# Patient Record
Sex: Female | Born: 1945 | Race: White | Hispanic: No | Smoking: Never smoker
Health system: Southern US, Community
[De-identification: ages and names within clinical notes are randomized; demographics above are authoritative.]

## PROBLEM LIST (undated history)

## (undated) DIAGNOSIS — C50919 Malignant neoplasm of unspecified site of unspecified female breast: Secondary | ICD-10-CM

## (undated) DIAGNOSIS — M1990 Unspecified osteoarthritis, unspecified site: Secondary | ICD-10-CM

## (undated) DIAGNOSIS — G4733 Obstructive sleep apnea (adult) (pediatric): Secondary | ICD-10-CM

## (undated) DIAGNOSIS — Z800 Family history of malignant neoplasm of digestive organs: Secondary | ICD-10-CM

## (undated) DIAGNOSIS — M5126 Other intervertebral disc displacement, lumbar region: Secondary | ICD-10-CM

## (undated) DIAGNOSIS — N6091 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: Secondary | ICD-10-CM

## (undated) DIAGNOSIS — K115 Sialolithiasis: Secondary | ICD-10-CM

## (undated) DIAGNOSIS — J309 Allergic rhinitis, unspecified: Secondary | ICD-10-CM

## (undated) DIAGNOSIS — E039 Hypothyroidism, unspecified: Secondary | ICD-10-CM

## (undated) DIAGNOSIS — Z803 Family history of malignant neoplasm of breast: Secondary | ICD-10-CM

## (undated) DIAGNOSIS — J45909 Unspecified asthma, uncomplicated: Secondary | ICD-10-CM

## (undated) DIAGNOSIS — E119 Type 2 diabetes mellitus without complications: Secondary | ICD-10-CM

## (undated) DIAGNOSIS — T7840XA Allergy, unspecified, initial encounter: Secondary | ICD-10-CM

## (undated) DIAGNOSIS — E7800 Pure hypercholesterolemia, unspecified: Secondary | ICD-10-CM

## (undated) DIAGNOSIS — G4730 Sleep apnea, unspecified: Secondary | ICD-10-CM

## (undated) DIAGNOSIS — E559 Vitamin D deficiency, unspecified: Secondary | ICD-10-CM

## (undated) HISTORY — DX: Pure hypercholesterolemia, unspecified: E78.00

## (undated) HISTORY — DX: Family history of malignant neoplasm of digestive organs: Z80.0

## (undated) HISTORY — DX: Unspecified asthma, uncomplicated: J45.909

## (undated) HISTORY — DX: Obstructive sleep apnea (adult) (pediatric): G47.33

## (undated) HISTORY — DX: Vitamin D deficiency, unspecified: E55.9

## (undated) HISTORY — DX: Type 2 diabetes mellitus without complications: E11.9

## (undated) HISTORY — DX: Family history of malignant neoplasm of breast: Z80.3

## (undated) HISTORY — DX: Allergic rhinitis, unspecified: J30.9

## (undated) HISTORY — PX: TONSILLECTOMY: SUR1361

## (undated) HISTORY — PX: APPENDECTOMY: SHX54

## (undated) HISTORY — PX: BREAST LUMPECTOMY: SHX2

## (undated) HISTORY — PX: INCISION AND DRAINAGE ABSCESS ANAL: SUR669

## (undated) HISTORY — DX: Allergy, unspecified, initial encounter: T78.40XA

## (undated) HISTORY — DX: Sleep apnea, unspecified: G47.30

## (undated) HISTORY — DX: Unspecified osteoarthritis, unspecified site: M19.90

## (undated) HISTORY — DX: Other intervertebral disc displacement, lumbar region: M51.26

## (undated) HISTORY — DX: Hypothyroidism, unspecified: E03.9

## (undated) HISTORY — DX: Malignant neoplasm of unspecified site of unspecified female breast: C50.919

## (undated) HISTORY — PX: COLONOSCOPY: SHX174

## (undated) HISTORY — DX: Sialolithiasis: K11.5

## (undated) HISTORY — PX: GANGLION CYST EXCISION: SHX1691

---

## 1974-03-17 HISTORY — PX: SHOULDER SURGERY: SHX246

## 2012-03-17 HISTORY — PX: KNEE SURGERY: SHX244

## 2014-12-13 ENCOUNTER — Ambulatory Visit (INDEPENDENT_AMBULATORY_CARE_PROVIDER_SITE_OTHER): Payer: BLUE CROSS/BLUE SHIELD | Admitting: Primary Care

## 2014-12-13 ENCOUNTER — Encounter: Payer: Self-pay | Admitting: Primary Care

## 2014-12-13 VITALS — BP 128/60 | HR 81 | Temp 98.0°F | Ht 64.25 in | Wt 294.0 lb

## 2014-12-13 DIAGNOSIS — J45909 Unspecified asthma, uncomplicated: Secondary | ICD-10-CM | POA: Insufficient documentation

## 2014-12-13 DIAGNOSIS — E039 Hypothyroidism, unspecified: Secondary | ICD-10-CM | POA: Insufficient documentation

## 2014-12-13 DIAGNOSIS — M5126 Other intervertebral disc displacement, lumbar region: Secondary | ICD-10-CM

## 2014-12-13 DIAGNOSIS — H6692 Otitis media, unspecified, left ear: Secondary | ICD-10-CM

## 2014-12-13 DIAGNOSIS — E785 Hyperlipidemia, unspecified: Secondary | ICD-10-CM

## 2014-12-13 MED ORDER — AMOXICILLIN-POT CLAVULANATE 875-125 MG PO TABS: 1.0000 | ORAL_TABLET | Freq: Two times a day (BID) | ORAL | Status: DC

## 2014-12-13 MED ORDER — ATORVASTATIN CALCIUM 10 MG PO TABS: 10.0000 mg | ORAL_TABLET | Freq: Every day | ORAL | Status: DC

## 2014-12-13 NOTE — Assessment & Plan Note (Signed)
Diagnosed 20 years ago.  Managed on Advair 250/50, albuterol PRN, and singulair. No wheezing on exam. Discussed to use advair daily and albuterol PRN.

## 2014-12-13 NOTE — Assessment & Plan Note (Signed)
Managed on atorvastatin 10 mg for years. Endorses "borderline" lipid panels for years. Discussed healthy diet and exercise. Will obtain old records for lipid panel.

## 2014-12-13 NOTE — Progress Notes (Signed)
Pre visit review using our clinic review tool, if applicable. No additional management support is needed unless otherwise documented below in the visit note. 

## 2014-12-13 NOTE — Assessment & Plan Note (Signed)
Present for years and located to L3-L4. Gets injections annually, recently had injections several months ago.

## 2014-12-13 NOTE — Patient Instructions (Addendum)
Start Augmentin antibiotics for ear infection. Take 1 tablet by mouth twice daily for 7 days.  Continue to drink plenty of water, about 2 liters daily.  Use your Advair inhaler (purple) daily as prescribed, this is a long acting inhaler. Use the albuterol inhaler for any shortness of breath or wheezing despite Advair use. This is a shorter acting inhaler that will last a short amount of time. Please notify me if you find that you are using your albuterol inhaler more than 4 times weekly.  Please schedule a physical with me next Spring, one year from your physical this year. You will also schedule a lab only appointment one week prior. We will discuss your lab results during your physical.  It was a pleasure to meet you today! Please don't hesitate to call me with any questions. Welcome to [facility]!
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
## 2014-12-13 NOTE — Assessment & Plan Note (Signed)
Currently managed on levothyroxine 175 mcg. Endorses TSH drawn several months ago in [place]. Will obtain old records.

## 2014-12-18 ENCOUNTER — Ambulatory Visit (INDEPENDENT_AMBULATORY_CARE_PROVIDER_SITE_OTHER): Payer: BLUE CROSS/BLUE SHIELD | Admitting: Primary Care

## 2014-12-18 ENCOUNTER — Encounter: Payer: Self-pay | Admitting: Primary Care

## 2014-12-18 VITALS — BP 122/72 | HR 80 | Temp 97.7°F | Ht 64.25 in | Wt 249.4 lb

## 2014-12-18 DIAGNOSIS — L089 Local infection of the skin and subcutaneous tissue, unspecified: Secondary | ICD-10-CM

## 2014-12-18 MED ORDER — DOXYCYCLINE HYCLATE 100 MG PO TABS: 100.0000 mg | ORAL_TABLET | Freq: Two times a day (BID) | ORAL | Status: DC
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
## 2014-12-18 NOTE — Patient Instructions (Signed)
Start Doxycycline antibiotic for toenail infection. Take 1 tablet by mouth twice daily for 10 days.  Stop Augmentin antibiotic.  Soak foot in warm epsom salt 30 minutes three times daily.  Protect toenail by wrapping if you wear close toed shoes.   Follow up if no improvement in 5-7 days.  It was a pleasure to see you today!

## 2014-12-18 NOTE — Progress Notes (Signed)
Pre visit review using our clinic review tool, if applicable. No additional management support is needed unless otherwise documented below in the visit note. 

## 2015-01-19 ENCOUNTER — Encounter (INDEPENDENT_AMBULATORY_CARE_PROVIDER_SITE_OTHER): Payer: Self-pay

## 2015-01-19 ENCOUNTER — Ambulatory Visit (INDEPENDENT_AMBULATORY_CARE_PROVIDER_SITE_OTHER): Payer: BLUE CROSS/BLUE SHIELD

## 2015-01-19 DIAGNOSIS — Z23 Encounter for immunization: Secondary | ICD-10-CM

## 2015-02-06 ENCOUNTER — Encounter: Payer: Self-pay | Admitting: Primary Care

## 2015-06-26 ENCOUNTER — Telehealth: Payer: Self-pay

## 2015-06-26 NOTE — Telephone Encounter (Signed)
Pt [facility]; pt established care 11/2014 and was to schedule CPX in spring; carrie will schedule pt for CPX and pt will get flonase OTC for now and get med refills updated at appt.

## 2015-07-12 ENCOUNTER — Other Ambulatory Visit: Payer: Self-pay | Admitting: Primary Care

## 2015-07-12 DIAGNOSIS — E785 Hyperlipidemia, unspecified: Secondary | ICD-10-CM

## 2015-07-12 DIAGNOSIS — E039 Hypothyroidism, unspecified: Secondary | ICD-10-CM

## 2015-07-12 DIAGNOSIS — Z0000 Encounter for general adult medical examination without abnormal findings: Secondary | ICD-10-CM

## 2015-07-16 ENCOUNTER — Other Ambulatory Visit (INDEPENDENT_AMBULATORY_CARE_PROVIDER_SITE_OTHER): Payer: BLUE CROSS/BLUE SHIELD

## 2015-07-16 DIAGNOSIS — E785 Hyperlipidemia, unspecified: Secondary | ICD-10-CM

## 2015-07-16 DIAGNOSIS — Z0000 Encounter for general adult medical examination without abnormal findings: Secondary | ICD-10-CM | POA: Diagnosis not present

## 2015-07-16 DIAGNOSIS — E039 Hypothyroidism, unspecified: Secondary | ICD-10-CM | POA: Diagnosis not present

## 2015-07-16 LAB — LIPID PANEL
Cholesterol: 177 mg/dL (ref 0–200)
HDL: 58.9 mg/dL (ref >39.00)
LDL Cholesterol: 97 mg/dL (ref 0–99)
NonHDL: 117.94
Total CHOL/HDL Ratio: 3
Triglycerides: 106 mg/dL (ref 0.0–149.0)
VLDL: 21.2 mg/dL (ref 0.0–40.0)

## 2015-07-16 LAB — CBC
HCT: 40.6 % (ref 36.0–46.0)
Hemoglobin: 13.6 g/dL (ref 12.0–15.0)
MCHC: 33.5 g/dL (ref 30.0–36.0)
MCV: 82 fl (ref 78.0–100.0)
Platelets: 274 10*3/uL (ref 150.0–400.0)
RBC: 4.95 Mil/uL (ref 3.87–5.11)
RDW: 13.9 % (ref 11.5–15.5)
WBC: 6.8 10*3/uL (ref 4.0–10.5)

## 2015-07-16 LAB — COMPREHENSIVE METABOLIC PANEL
ALT: 17 U/L (ref 0–35)
AST: 14 U/L (ref 0–37)
Albumin: 4.2 g/dL (ref 3.5–5.2)
Alkaline Phosphatase: 88 U/L (ref 39–117)
BUN: 16 mg/dL (ref 6–23)
CO2: 27 mEq/L (ref 19–32)
Calcium: 9.7 mg/dL (ref 8.4–10.5)
Chloride: 103 mEq/L (ref 96–112)
Creatinine, Ser: 0.65 mg/dL (ref 0.40–1.20)
GFR: 96.49 mL/min (ref >60.00)
Glucose, Bld: 114 mg/dL — ABNORMAL HIGH (ref 70–99)
Potassium: 4.2 mEq/L (ref 3.5–5.1)
Sodium: 140 mEq/L (ref 135–145)
Total Bilirubin: 0.4 mg/dL (ref 0.2–1.2)
Total Protein: 7 g/dL (ref 6.0–8.3)

## 2015-07-16 LAB — VITAMIN D 25 HYDROXY (VIT D DEFICIENCY, FRACTURES): VITD: 23.11 ng/mL — ABNORMAL LOW (ref 30.00–100.00)

## 2015-07-16 LAB — TSH: TSH: 0.89 u[IU]/mL (ref 0.35–4.50)

## 2015-07-16 LAB — HEMOGLOBIN A1C: Hgb A1c MFr Bld: 6.4 % (ref 4.6–6.5)

## 2015-07-18 ENCOUNTER — Ambulatory Visit (INDEPENDENT_AMBULATORY_CARE_PROVIDER_SITE_OTHER): Payer: BLUE CROSS/BLUE SHIELD | Admitting: Primary Care

## 2015-07-18 ENCOUNTER — Encounter: Payer: Self-pay | Admitting: Primary Care

## 2015-07-18 VITALS — BP 122/72 | HR 83 | Temp 97.5°F | Ht 64.0 in | Wt 240.8 lb

## 2015-07-18 DIAGNOSIS — Z0100 Encounter for examination of eyes and vision without abnormal findings: Secondary | ICD-10-CM | POA: Diagnosis not present

## 2015-07-18 DIAGNOSIS — M5126 Other intervertebral disc displacement, lumbar region: Secondary | ICD-10-CM

## 2015-07-18 DIAGNOSIS — J45909 Unspecified asthma, uncomplicated: Secondary | ICD-10-CM

## 2015-07-18 DIAGNOSIS — E119 Type 2 diabetes mellitus without complications: Secondary | ICD-10-CM | POA: Insufficient documentation

## 2015-07-18 DIAGNOSIS — E1165 Type 2 diabetes mellitus with hyperglycemia: Secondary | ICD-10-CM | POA: Insufficient documentation

## 2015-07-18 DIAGNOSIS — Z23 Encounter for immunization: Secondary | ICD-10-CM

## 2015-07-18 DIAGNOSIS — R7303 Prediabetes: Secondary | ICD-10-CM | POA: Diagnosis not present

## 2015-07-18 DIAGNOSIS — Z1239 Encounter for other screening for malignant neoplasm of breast: Secondary | ICD-10-CM | POA: Diagnosis not present

## 2015-07-18 DIAGNOSIS — E559 Vitamin D deficiency, unspecified: Secondary | ICD-10-CM | POA: Diagnosis not present

## 2015-07-18 DIAGNOSIS — Z0000 Encounter for general adult medical examination without abnormal findings: Secondary | ICD-10-CM | POA: Insufficient documentation

## 2015-07-18 DIAGNOSIS — E785 Hyperlipidemia, unspecified: Secondary | ICD-10-CM

## 2015-07-18 DIAGNOSIS — E039 Hypothyroidism, unspecified: Secondary | ICD-10-CM

## 2015-07-18 NOTE — Assessment & Plan Note (Signed)
Td and all Pneumonia UTD per patient, she will double check. Nothing from records received with this information. Mammogram ordered, colonoscopy UTD, declines Pap. Exam unremarkable. Labs with borderline diabetes and low vitamin d. Discussed the importance of a healthy diet and regular exercise in order for weight loss and to reduce risk of other medical diseases.  Follow up in 1 year for repeat physical or sooner if needed.

## 2015-07-18 NOTE — Progress Notes (Signed)
Pre visit review using our clinic review tool, if applicable. No additional management support is needed unless otherwise documented below in the visit note. 

## 2015-07-18 NOTE — Assessment & Plan Note (Signed)
Would like further evaluation for epidural spinal injections. Will send referral.

## 2015-07-18 NOTE — Progress Notes (Signed)
Subjective:    Patient ID: Molly Wall, female    DOB: [birth year], 70 y.o.   MRN: [id number]  HPI  Molly Wall is a 70 year old female who presents today for complete physical.  Immunizations: -Tetanus: Completed within 5 years -Influenza: Completed in 2016 -Pneumonia: Completed both in [place].  -Shingles: Never completed. Due today.  Diet: She endorses a healthy diet. Breakfast: [name] and eggs Lunch: Sandwich, potato chips, eats out Dinner: Meat, green beans, salad with ranch dressing, hummus Snacks: Small snack at bedtime (chips, meat) Desserts: Occasionally (4 times weekly) Beverages: Water, berry drink, occasional lemonade   Exercise: She does not currently exercise. Eye exam: Has not completed in 3 years, due, needs referral.  Dental exam: Completed in 2016. Colonoscopy: Completed last 3-4 years ago. Due in 6 years. Dexa: Completed in 2011, normal Pap Smear: Has not completed in years, declines Mammogram: Completes annually, last completed April 2016.   Wt Readings from Last 3 Encounters:  07/18/15 240 lb 12.8 oz (109.226 kg)  12/18/14 249 lb 6.4 oz (113.127 kg)  12/13/14 294 lb (133.358 kg)      Review of Systems  Constitutional: Negative for unexpected weight change.  HENT: Negative for rhinorrhea.   Respiratory: Negative for cough and shortness of breath.   Cardiovascular: Negative for chest pain.  Gastrointestinal: Negative for diarrhea and constipation.  Genitourinary: Negative for difficulty urinating.  Musculoskeletal: Positive for back pain.       Chronic back pain, due for repeat epidural/injections.    Skin: Negative for rash.  Allergic/Immunologic: Positive for environmental allergies.  Neurological: Negative for dizziness and headaches.       Occasional numbness to toes and feet  Psychiatric/Behavioral:       Denies concerns for anxiety and depression       Past Medical History  Diagnosis Date  . Asthma   . Lumbar herniated disc   .  Hypothyroidism   . Vitamin D deficiency   . Hypercholesteremia   . OSA (obstructive sleep apnea)   . Allergic rhinitis      Social History   Social History  . Marital Status: Single    Spouse Name: N/A  . Number of Children: N/A  . Years of Education: N/A   Occupational History  . Not on file.   Social History Main Topics  . Smoking status: Former Smoker    Types: Cigarettes  . Smokeless tobacco: Not on file  . Alcohol Use: Yes     Comment: 2 per week  . Drug Use: Not on file  . Sexual Activity: Not on file   Other Topics Concern  . Not on file   Social History Narrative   Single.   Moved from [place] to [place] several weeks ago.   Family lives in [place].   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    Past Surgical History  Procedure Laterality Date  . Shoulder surgery  1966  . Knee surgery  2014  . Appendectomy      Family History  Problem Relation Age of Onset  . Alzheimer's disease Mother   . Asthma Mother   . Arthritis Father   . Cancer Sister     No Known Allergies  Current Outpatient Prescriptions on File Prior to Visit  Medication Sig Dispense Refill  . albuterol (PROVENTIL HFA;VENTOLIN HFA) 108 (90 BASE) MCG/ACT inhaler Inhale 2 puffs into the lungs every  6 (six) hours as needed for wheezing or shortness of breath.    Marland Kitchen atorvastatin (LIPITOR) 10 MG tablet Take 1 tablet (10 mg total) by mouth daily. 90 tablet 2  . fluticasone (FLONASE) 50 MCG/ACT nasal spray Place into both nostrils daily.    . Fluticasone-Salmeterol (ADVAIR) 250-50 MCG/DOSE AEPB Inhale 1 puff into the lungs 2 (two) times daily.    . folic acid (FOLVITE) 1 MG tablet Take 1 mg by mouth daily.    Marland Kitchen levothyroxine (SYNTHROID, LEVOTHROID) 175 MCG tablet Take 175 mcg by mouth daily.    . montelukast (SINGULAIR) 10 MG tablet Take 10 mg by mouth at bedtime.     No current facility-administered medications  on file prior to visit.    BP 122/72 mmHg  Pulse 83  Temp(Src) 97.5 F (36.4 C) (Oral)  Ht 5\' 4"  (1.626 m)  Wt 240 lb 12.8 oz (109.226 kg)  BMI 41.31 kg/m2  SpO2 98%    Objective:   Physical Exam  Constitutional: She is oriented to person, place, and time. She appears well-nourished.  HENT:  Right Ear: Tympanic membrane and ear canal normal.  Left Ear: Tympanic membrane and ear canal normal.  Nose: Nose normal.  Mouth/Throat: Oropharynx is clear and moist.  Eyes: Conjunctivae and EOM are normal. Pupils are equal, round, and reactive to light.  Neck: Neck supple. Carotid bruit is not present. No thyromegaly present.  Cardiovascular: Normal rate and regular rhythm.   No murmur heard. Pulmonary/Chest: Effort normal and breath sounds normal. She has no rales.  Abdominal: Soft. Bowel sounds are normal. There is no tenderness.  Musculoskeletal: Normal range of motion.  Lymphadenopathy:    She has no cervical adenopathy.  Neurological: She is alert and oriented to person, place, and time. She has normal reflexes. No cranial nerve deficit.  Skin: Skin is warm and dry. No rash noted.  Psychiatric: She has a normal mood and affect.          Assessment & Plan:

## 2015-07-18 NOTE — Patient Instructions (Signed)
Increase your vitamin D capsules to 2000 units daily for low vitamin D levels.  You will be contacted regarding your referral to Optometry and also for your mammogram.  Please let us know if you have not heard back within one week.   It is important that you improve your diet. Please limit carbohydrates in the form of white bread, rice, pasta, potato chips, pretzels, processed carbohydrates, etc. Increase your consumption of fresh fruits and vegetables, lean protein.  Ensure you are consuming 64 ounces of water daily.  Start exercising. You should be getting 1 hour of moderate intensity exercise 5 days weekly.  Schedule a lab only appointment in 3 months for recheck of your sugar levels and vitamin D.  Follow up in 1 year for repeat physical or sooner if needed.  It was a pleasure to see you today!

## 2015-07-18 NOTE — Assessment & Plan Note (Signed)
Stable on lipitor per recent labs. Continue.

## 2015-07-18 NOTE — Assessment & Plan Note (Signed)
Stable on inhalers

## 2015-07-18 NOTE — Assessment & Plan Note (Signed)
A1C of 6.4 on recent labs. Discussed immediate changes in diet that are necessary in order to reduce sugars. Discussed importance of regular exercise. Will recheck labs in 3 months.

## 2015-07-18 NOTE — Assessment & Plan Note (Signed)
TSH stable per recent labs. Continue levothyroxine 175 mcg.

## 2015-07-18 NOTE — Assessment & Plan Note (Signed)
Level of 23 on recent labs.  Currently managed on 1000 units daily. Will increase to 2000 units daily and recheck labs in 3 months.

## 2015-08-20 LAB — HM DIABETES EYE EXAM

## 2015-08-23 ENCOUNTER — Encounter: Payer: Self-pay | Admitting: Primary Care

## 2015-09-09 ENCOUNTER — Other Ambulatory Visit: Payer: Self-pay | Admitting: Primary Care

## 2015-09-10 ENCOUNTER — Ambulatory Visit (INDEPENDENT_AMBULATORY_CARE_PROVIDER_SITE_OTHER): Payer: BLUE CROSS/BLUE SHIELD | Admitting: Primary Care

## 2015-09-10 ENCOUNTER — Encounter: Payer: Self-pay | Admitting: Primary Care

## 2015-09-10 VITALS — BP 126/74 | HR 72 | Temp 98.2°F | Ht 64.0 in | Wt 239.8 lb

## 2015-09-10 DIAGNOSIS — J302 Other seasonal allergic rhinitis: Secondary | ICD-10-CM

## 2015-09-10 MED ORDER — FLUTICASONE PROPIONATE 50 MCG/ACT NA SUSP: 2.0000 | Freq: Every day | NASAL | Status: DC

## 2015-09-10 NOTE — Progress Notes (Signed)
Subjective:    Patient ID: Molly Wall, female    DOB: 06-22-1945, 70 y.o.   MRN: [id number]  HPI  Molly Wall is a 70 year old female who presents today with a chief complaint of rash. She found a tick on her right inner thigh several weeks ago. She then felt itchy for several weeks. She then started to notice a rash to her right posterior neck/shoulder with itching. She's applied cortisone cream for the past 1 week to her posterior neck with improvement. Denies rash to her inner thigh, fatigue, fevers, headaches. She moved to [place] recently and is concerned about Lyme disease.  Review of Systems  Constitutional: Negative for fever and fatigue.  Gastrointestinal: Negative for nausea.  Skin: Positive for rash.  Neurological: Negative for dizziness and headaches.       Past Medical History  Diagnosis Date  . Asthma   . Lumbar herniated disc   . Hypothyroidism   . Vitamin D deficiency   . Hypercholesteremia   . OSA (obstructive sleep apnea)   . Allergic rhinitis      Social History   Social History  . Marital Status: Single    Spouse Name: N/A  . Number of Children: N/A  . Years of Education: N/A   Occupational History  . Not on file.   Social History Main Topics  . Smoking status: Former Smoker    Types: Cigarettes  . Smokeless tobacco: Not on file  . Alcohol Use: Yes     Comment: 2 per week  . Drug Use: Not on file  . Sexual Activity: Not on file   Other Topics Concern  . Not on file   Social History Narrative   Single.   Moved from [place] to [place] several weeks ago.   Family lives in [place].   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    Past Surgical History  Procedure Laterality Date  . Shoulder surgery  1966  . Knee surgery  2014  . Appendectomy      Family History  Problem Relation Age of Onset  . Alzheimer's disease Mother   . Asthma Mother   .  Arthritis Father   . Cancer Sister     No Known Allergies  Current Outpatient Prescriptions on File Prior to Visit  Medication Sig Dispense Refill  . albuterol (PROVENTIL HFA;VENTOLIN HFA) 108 (90 BASE) MCG/ACT inhaler Inhale 2 puffs into the lungs every 6 (six) hours as needed for wheezing or shortness of breath.    Marland Kitchen atorvastatin (LIPITOR) 10 MG tablet TAKE 1 TABLET DAILY 90 tablet 1  . Fluticasone-Salmeterol (ADVAIR) 250-50 MCG/DOSE AEPB Inhale 1 puff into the lungs 2 (two) times daily.    . folic acid (FOLVITE) 1 MG tablet Take 1 mg by mouth daily.    Marland Kitchen levothyroxine (SYNTHROID, LEVOTHROID) 175 MCG tablet Take 175 mcg by mouth daily.    . montelukast (SINGULAIR) 10 MG tablet Take 10 mg by mouth at bedtime.     No current facility-administered medications on file prior to visit.    BP 126/74 mmHg  Pulse 72  Temp(Src) 98.2 F (36.8 C) (Oral)  Ht 5\' 4"  (1.626 m)  Wt 239 lb 12.8 oz (108.773 kg)  BMI 41.14 kg/m2  SpO2 98%    Objective:   Physical Exam  Constitutional: She appears well-nourished. She does not appear ill.  Neck: Neck supple.  Cardiovascular:  Normal rate and regular rhythm.   Pulmonary/Chest: Effort normal and breath sounds normal.  Skin: Skin is warm and dry. No rash noted. No erythema.  No rashes present to right inner thigh, posterior neck or anywhere else on her body.          Assessment & Plan:  Rash:  Tick bite several weeks ago without rash or any symptoms at this point.  Itching with mild rash to posterior neck for 1 week that has nearly resolved. No obvious rash or lesions noted to skin today.  Reassurance provided that she does not have Lyme disease or any other tickborne illness at this time.  Discussed the use of over-the-counter hydrocortisone cream for any future itching.  Information provided to her today regarding Lyme disease and [facility] spotted fever and to alert me if she develops any of those symptoms.

## 2015-09-10 NOTE — Progress Notes (Signed)
Pre visit review using our clinic review tool, if applicable. No additional management support is needed unless otherwise documented below in the visit note. 

## 2015-09-10 NOTE — Patient Instructions (Signed)
Use the hydrocortisone cream again in the future if the bites/itching returns.  Please notify me if you develop the "bulls eye" rash, fatigue, fevers, body aches, headaches, nausea.  Ensure you are wearing bug spray when outdoors to prevent future bites.  It was a pleasure to see you today!  Lyme Disease Lyme disease is an infection that affects many parts of the body, including the skin, joints, and nervous system. CAUSES Lyme disease is caused by bacteria called Borrelia burgdorferi. You can get Lyme disease by being bitten by an infected tick. The tick must be attached to your skin for at least 36 hours to transmit the infection. Deer often carry infected ticks. RISK FACTORS  Living in or visiting [place] states, or the [place].  Spending time in wooded or grassy areas.  Being outdoors with exposed skin.  Failing to remove a tick from your skin within 3-4 days. SIGNS AND SYMPTOMS  A round, red rash that comes out from the center of the tick bite. This is the first sign of infection. The center of the rash may be blood colored or have tiny blisters.  Fatigue.  Headache.  Chills and fever.  General achiness.  Joint pain, often in the knee.  Swollen lymph glands. DIAGNOSIS Lyme disease is diagnosed with a medical history, physical exam, and blood test. TREATMENT The main treatment is antibiotic medicine, usually taken by mouth. The length of treatment depends on how soon after a tick bite you begin taking the medicine. In some cases, treatment is necessary for several weeks. If the infection is severe, IV antibiotics may be necessary. HOME CARE INSTRUCTIONS  Take your antibiotic medicine as directed by your health care provider. Finish the antibiotic even if you start to feel better.  You may take a probiotic in between doses of your antibiotic to help avoid stomach upset or diarrhea.  Check with your health care provider before supplementing  your treatment. Many alternative therapies have not been proven and may be harmful to you.  Keep all follow-up visits as directed by your health care provider. This is important. PREVENTION Reinfection is possible with another tick bite by an infected tick. Take these precautions to prevent an infection:  Cover your skin with light-colored clothing when outdoors in the spring and summer months.  Spray clothing and skin with bug spray. The spray should be 20-30% DEET.  Avoided wooded, grassy, and shaded areas.  Remove yard litter, brush, trash, and plants that attract deer and rodents.  Check yourself for ticks when you come indoors.  Wash clothing worn each day.  Check your pets for ticks before they come inside.  If you find a tick:  Remove it with tweezers.  Clean your hands and the bite area with rubbing alcohol or soap and water. Pregnant women should take special care to avoid tick bites because the infection can be passed along to the fetus. SEEK MEDICAL CARE IF:  You have symptoms after treatment.  You have removed a tick and want to bring it to your health care provider for testing. SEEK IMMEDIATE MEDICAL CARE IF:  You have an irregular heartbeat.  You have nerve pain.  Your face feels numb. MAKE SURE YOU:  Understand these instructions.  Will watch your condition.  Will get help right away if you are not doing well or get worse.   This information is not intended to replace advice given to you by your health care provider. Make sure you  discuss any questions you have with your health care provider.   Document Released: 06/09/2000 Document Revised: 03/24/2014 Document Reviewed: 07/19/2013 Elsevier Interactive Patient Education 2016 [place] Spotted Fever [facility] spotted fever is an illness that is spread to people by infected ticks. The illness causes flulike symptoms and a reddish-purple rash. This illness can quickly become  very serious. Treatment must be started right away. When the illness is not treated right away, it can sometimes lead to long-term health problems or even death. This illness is most common during warm weather when ticks are most active. CAUSES [facility] spotted fever is caused by a type of bacteria that is called Rickettsia rickettsii. This type of bacteria is carried by [place] dog ticks and [employer]. People get infected through a bite from a tick that is infected with the bacteria. The bite is painless, and it frequently goes unnoticed. The bacteria can also infect a person when tick blood or tick feces get into a person's body through damaged skin. A tick bite is not necessary for an infection to occur. People can get [facility] spotted fever if they get a tick's blood or body fluids on their skin in the area of a small cut or sore. This could happen while removing a tick from another person or a dog. The infection is not contagious, and it cannot be spread (transmitted) from person to person. SIGNS AND SYMPTOMS Symptoms may begin 2-14 days after a tick bite. The most common early symptoms are:  Fever.  Muscle aches.  Headache.  Nausea.  Vomiting.  Poor appetite.  Abdominal pain. The reddish-purple rash usually appears 3-5 days after the first symptoms begin. The rash often starts on the wrists and ankles. It may then spread to the palms, the soles of the feet, the legs, and the trunk. DIAGNOSIS Diagnosis is based on a physical exam, medical history, and blood tests. Your health care provider may suspect [facility] spotted fever in one of these cases:   If you have recently been bitten by a tick.  If you have been in areas that have a lot of ticks or in areas where the disease is common. TREATMENT It is important to begin treatment right away. Treatment will usually involve the use of antibiotic medicines. In some cases, your health care provider may  begin treatment before the diagnosis is confirmed. If your symptoms are severe, a hospital stay may be needed. HOME CARE INSTRUCTIONS  Rest as much as possible until you feel better.  Take medicines only as directed by your health care provider.  Take your antibiotic medicine as directed by your health care provider. Finish the antibiotic even if you start to feel better.  Drink enough fluid to keep your urine clear or pale yellow.  Keep all follow-up visits as directed by your health care provider. This is important. PREVENTION Avoiding tick bites can help to prevent this illness. Take these steps to avoid tick bites when you are outdoors:  Be aware that most ticks live in shrubs, low tree branches, and grassy areas. A tick can climb onto your body when you make contact with leaves or grass where the tick is waiting.  Wear protective clothing. Long sleeves and long pants are best.  Wear white clothes so you can see ticks more easily.  Tuck your pant legs into your socks.  If you go walking on a trail, stay in the middle of the trail to  avoid brushing against bushes.  Avoid walking through areas that have long grass.  Put insect repellent on all exposed skin and along boot tops, pant legs, and sleeve cuffs.  Check clothing, hair, and skin repeatedly and before going inside.  Check family members and pets for ticks.  Brush off any ticks that are not attached.  Take a shower or a bath as soon as possible after you have been outdoors. Check your skin for ticks. The most common places on the body where ticks attach themselves are the scalp, neck, armpits, waist, and groin. You can also greatly reduce your chances of getting [facility] spotted fever if you remove attached ticks as soon as possible. To remove an attached tick, use a forceps or fine-point tweezers to detach the intact tick without leaving its mouth parts in the skin. The wound from the tick bite should be washed  after the tick has been removed. SEEK MEDICAL CARE IF:  You have drainage, swelling, or increased redness or pain in the area of the rash. SEEK IMMEDIATE MEDICAL CARE IF:  You have chest pain.  You have shortness of breath.  You have a severe headache.  You have a seizure.  You have severe abdominal pain.  You are feeling confused.  You are bruising easily.  You have bleeding from your gums.  You have blood in your stool.   This information is not intended to replace advice given to you by your health care provider. Make sure you discuss any questions you have with your health care provider.   Document Released: 06/15/2000 Document Revised: 03/24/2014 Document Reviewed: 10/17/2013 Elsevier Interactive Patient Education [employer].

## 2015-10-09 ENCOUNTER — Ambulatory Visit
Admission: RE | Admit: 2015-10-09 | Discharge: 2015-10-09 | Disposition: A | Discharge status: Home / Self Care | Payer: BLUE CROSS/BLUE SHIELD | Source: Ambulatory Visit | Attending: Primary Care | Admitting: Primary Care

## 2015-10-09 DIAGNOSIS — Z1239 Encounter for other screening for malignant neoplasm of breast: Secondary | ICD-10-CM

## 2015-10-17 ENCOUNTER — Other Ambulatory Visit: Payer: Self-pay | Admitting: Primary Care

## 2015-10-17 DIAGNOSIS — R928 Other abnormal and inconclusive findings on diagnostic imaging of breast: Secondary | ICD-10-CM

## 2015-10-23 ENCOUNTER — Ambulatory Visit
Admission: RE | Admit: 2015-10-23 | Discharge: 2015-10-23 | Disposition: A | Discharge status: Home / Self Care | Payer: BLUE CROSS/BLUE SHIELD | Source: Ambulatory Visit | Attending: Primary Care | Admitting: Primary Care

## 2015-10-23 DIAGNOSIS — R928 Other abnormal and inconclusive findings on diagnostic imaging of breast: Secondary | ICD-10-CM

## 2015-12-10 ENCOUNTER — Telehealth: Payer: Self-pay | Admitting: Primary Care

## 2015-12-10 DIAGNOSIS — G8929 Other chronic pain: Secondary | ICD-10-CM

## 2015-12-10 DIAGNOSIS — M549 Dorsalgia, unspecified: Principal | ICD-10-CM

## 2015-12-10 NOTE — Telephone Encounter (Signed)
Patient was referred to [facility]. Patent wen to appointment on 12/05/15 and found out that patient isn't in network for her insurance,[employer].  Patient said she has a list of 200 doctors who are in network.  Please put in new referral for patient. Patient is asking for this to be done as soon as possible because she has already waited a couple of months for an appointment.

## 2015-12-11 NOTE — Telephone Encounter (Signed)
How did this happen? Rosaria Ferries, are you aware? I put in a new referral, please ensure they are within her network. Vallarie Mare, please call and apologize to patient, let her know that we are working on a new referral.

## 2015-12-11 NOTE — Telephone Encounter (Signed)
Spoken and notified patient of Kate's comments. Patient verbalized understanding and will wait for the phone call.

## 2015-12-12 NOTE — Telephone Encounter (Signed)
Noted  

## 2015-12-12 NOTE — Telephone Encounter (Signed)
Unfortunately it is the patients responsibility to ensure who they are going to is in their network. Her insurance is out of [place], typically [facility] would tell us or the patient that they were out of her network. We fax a copy of our patients insurance cards to the referring office and they accepted her as a patient. I called to try and inquire but could never get a person on the phone.The patient called her insurance co and got Dr Nicholaus Bloom name so we are now sending her there. I also got Dr Maryjean Ka note for her chart.

## 2015-12-28 ENCOUNTER — Ambulatory Visit (INDEPENDENT_AMBULATORY_CARE_PROVIDER_SITE_OTHER): Payer: BLUE CROSS/BLUE SHIELD

## 2015-12-28 DIAGNOSIS — Z23 Encounter for immunization: Secondary | ICD-10-CM | POA: Diagnosis not present

## 2016-01-07 ENCOUNTER — Other Ambulatory Visit: Payer: Self-pay

## 2016-01-07 DIAGNOSIS — E039 Hypothyroidism, unspecified: Secondary | ICD-10-CM

## 2016-01-07 DIAGNOSIS — J45909 Unspecified asthma, uncomplicated: Secondary | ICD-10-CM

## 2016-01-07 DIAGNOSIS — J302 Other seasonal allergic rhinitis: Secondary | ICD-10-CM

## 2016-01-07 MED ORDER — FLUTICASONE PROPIONATE 50 MCG/ACT NA SUSP: 2.0000 | Freq: Every day | NASAL | 5 refills | Status: DC

## 2016-01-07 MED ORDER — LEVOTHYROXINE SODIUM 175 MCG PO TABS: 175.0000 ug | ORAL_TABLET | Freq: Every day | ORAL | 2 refills | Status: DC

## 2016-01-07 MED ORDER — ALBUTEROL SULFATE HFA 108 (90 BASE) MCG/ACT IN AERS: 2.0000 | INHALATION_SPRAY | Freq: Four times a day (QID) | RESPIRATORY_TRACT | 5 refills | Status: DC | PRN

## 2016-01-07 NOTE — Telephone Encounter (Signed)
Pt requesting refills on the following meds I don't see anywhere were they have been filled before? Ok to refill?

## 2016-01-09 ENCOUNTER — Encounter: Payer: Self-pay | Admitting: Primary Care

## 2016-01-09 ENCOUNTER — Ambulatory Visit (INDEPENDENT_AMBULATORY_CARE_PROVIDER_SITE_OTHER): Payer: BLUE CROSS/BLUE SHIELD | Admitting: Primary Care

## 2016-01-09 VITALS — BP 148/94 | HR 89 | Temp 97.9°F | Ht 64.0 in | Wt 242.0 lb

## 2016-01-09 DIAGNOSIS — E559 Vitamin D deficiency, unspecified: Secondary | ICD-10-CM | POA: Diagnosis not present

## 2016-01-09 DIAGNOSIS — R7303 Prediabetes: Secondary | ICD-10-CM

## 2016-01-09 DIAGNOSIS — R002 Palpitations: Secondary | ICD-10-CM

## 2016-01-09 NOTE — Progress Notes (Signed)
Pre visit review using our clinic review tool, if applicable. No additional management support is needed unless otherwise documented below in the visit note. 

## 2016-01-09 NOTE — Patient Instructions (Addendum)
Your ECG looks good!  Complete lab work prior to leaving today. I will notify you of your results once received.   You must get adequate sleep every night. I suspect this is a contributing factor to your palpitations.  Use your Advair inhaler daily (purple inhaler). Inhale 1 puff into the lungs twice daily.  Use the albuterol inhaler every 6 hours as needed for wheezing/shortness of breath.  Continue your Singulair.   Please call me if you develop increased palpitations, chest pain, nausea.  It was a pleasure to see you today!

## 2016-01-09 NOTE — Progress Notes (Signed)
Subjective:    Patient ID: Molly Wall, female    DOB: Oct 16, 1945, 70 y.o.   MRN: [id number]  HPI  Molly Wall is a 70 year old female with a history of hypothyroidism and asthma who presents with a complaint of palpitations. She also reports increased stress, fatigue, shortness of breath. Her symptoms have been present intermittently for the past 2 weeks. 2 weeks ago she felt "fuzzy headed" which has since resolved.   She has been under increased stress as she is the main caregiver for her mother and is also caring for her daughter and her children as her daughter recently underwent back surgery. She hasn't slept well in weeks due to stress and caring for her family. She has been using her albuterol inhaler more often which has helped to reduce her palpitations. She has not experienced her palpitations for 2 days as she was finally able to sleep. She slept for for 11 hours total that night.   She denies chest pain, dizziness, visual changes, weakness.   Review of Systems  Constitutional: Positive for fatigue.  Eyes: Negative for visual disturbance.  Respiratory: Positive for shortness of breath.   Cardiovascular: Positive for palpitations. Negative for chest pain.  Neurological: Negative for dizziness and weakness.       Past Medical History:  Diagnosis Date  . Allergic rhinitis   . Asthma   . Hypercholesteremia   . Hypothyroidism   . Lumbar herniated disc   . OSA (obstructive sleep apnea)   . Vitamin D deficiency      Social History   Social History  . Marital status: Single    Spouse name: N/A  . Number of children: N/A  . Years of education: N/A   Occupational History  . Not on file.   Social History Main Topics  . Smoking status: Former Smoker    Types: Cigarettes  . Smokeless tobacco: Not on file  . Alcohol use Yes     Comment: 2 per week  . Drug use: Unknown  . Sexual activity: Not on file   Other Topics Concern  . Not on file   Social History  Narrative   Single.   Moved from [place] to [place] several weeks ago.   Family lives in [place].   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    Past Surgical History:  Procedure Laterality Date  . APPENDECTOMY    . KNEE SURGERY  2014  . SHOULDER SURGERY  1966    Family History  Problem Relation Age of Onset  . Alzheimer's disease Mother   . Asthma Mother   . Arthritis Father   . Cancer Sister     No Known Allergies  Current Outpatient Prescriptions on File Prior to Visit  Medication Sig Dispense Refill  . albuterol (PROVENTIL HFA;VENTOLIN HFA) 108 (90 Base) MCG/ACT inhaler Inhale 2 puffs into the lungs every 6 (six) hours as needed for wheezing or shortness of breath. 1 Inhaler 5  . atorvastatin (LIPITOR) 10 MG tablet TAKE 1 TABLET DAILY 90 tablet 1  . fluticasone (FLONASE) 50 MCG/ACT nasal spray Place 2 sprays into both nostrils daily. 16 g 5  . Fluticasone-Salmeterol (ADVAIR) 250-50 MCG/DOSE AEPB Inhale 1 puff into the lungs 2 (two) times daily.    Marland Kitchen levothyroxine (SYNTHROID, LEVOTHROID) 175 MCG tablet Take 1 tablet (175 mcg total) by mouth daily. 90 tablet 2  . montelukast (SINGULAIR) 10  MG tablet Take 10 mg by mouth at bedtime.    . folic acid (FOLVITE) 1 MG tablet Take 1 mg by mouth daily.     No current facility-administered medications on file prior to visit.     BP (!) 148/94   Pulse 89   Temp 97.9 F (36.6 C) (Oral)   Ht 5\' 4"  (1.626 m)   Wt 242 lb (109.8 kg)   SpO2 97%   BMI 41.54 kg/m    Objective:   Physical Exam  Constitutional: She appears well-nourished.  Neck: Neck supple.  Cardiovascular: Normal rate, regular rhythm and normal heart sounds.   Pulmonary/Chest: Effort normal and breath sounds normal. She has no wheezes.  Skin: Skin is warm and dry.  Psychiatric: She has a normal mood and affect.          Assessment & Plan:  Palpitations:  Intermittently  present for the past 2 weeks. Highly suspect this is due to increased stress and lack of sleep. No Palpitations for the past 2 days as she is finally able to sleep. EKG today with normal sinus rhythm, no PAC/PVC, T-wave inversion, ST changes. Given history of hypothyroidism, we will recheck TSH. Also check BMP and CBC. Long discussion today regarding caregiver burden and the importance of frequent breaks. Strict return precautions provided. She is stable for outpatient treatment.  Sheral Flow, NP

## 2016-01-10 ENCOUNTER — Other Ambulatory Visit: Payer: Self-pay | Admitting: Primary Care

## 2016-01-10 DIAGNOSIS — E119 Type 2 diabetes mellitus without complications: Secondary | ICD-10-CM

## 2016-01-10 LAB — CBC
HCT: 41.4 % (ref 36.0–46.0)
Hemoglobin: 13.7 g/dL (ref 12.0–15.0)
MCHC: 33 g/dL (ref 30.0–36.0)
MCV: 84.1 fl (ref 78.0–100.0)
Platelets: 243 10*3/uL (ref 150.0–400.0)
RBC: 4.92 Mil/uL (ref 3.87–5.11)
RDW: 14.2 % (ref 11.5–15.5)
WBC: 7.7 10*3/uL (ref 4.0–10.5)

## 2016-01-10 LAB — BASIC METABOLIC PANEL
BUN: 19 mg/dL (ref 6–23)
CO2: 29 mEq/L (ref 19–32)
Calcium: 9.6 mg/dL (ref 8.4–10.5)
Chloride: 100 mEq/L (ref 96–112)
Creatinine, Ser: 0.81 mg/dL (ref 0.40–1.20)
GFR: 74.74 mL/min (ref >60.00)
Glucose, Bld: 128 mg/dL — ABNORMAL HIGH (ref 70–99)
Potassium: 4 mEq/L (ref 3.5–5.1)
Sodium: 138 mEq/L (ref 135–145)

## 2016-01-10 LAB — TSH: TSH: 1.65 u[IU]/mL (ref 0.35–4.50)

## 2016-01-10 LAB — VITAMIN D 25 HYDROXY (VIT D DEFICIENCY, FRACTURES): VITD: 26.52 ng/mL — ABNORMAL LOW (ref 30.00–100.00)

## 2016-01-10 LAB — HEMOGLOBIN A1C: Hgb A1c MFr Bld: 6.6 % — ABNORMAL HIGH (ref 4.6–6.5)

## 2016-01-11 ENCOUNTER — Other Ambulatory Visit: Payer: Self-pay | Admitting: *Deleted

## 2016-01-11 DIAGNOSIS — E039 Hypothyroidism, unspecified: Secondary | ICD-10-CM

## 2016-01-11 MED ORDER — LEVOTHYROXINE SODIUM 175 MCG PO TABS: 175.0000 ug | ORAL_TABLET | Freq: Every day | ORAL | 0 refills | Status: DC

## 2016-01-11 MED ORDER — FOLIC ACID 1 MG PO TABS: 1.0000 mg | ORAL_TABLET | Freq: Every day | ORAL | 0 refills | Status: DC

## 2016-01-11 NOTE — Telephone Encounter (Signed)
Pt left message at Triage requesting a small amount of meds sent to [facility] until she gets her mail order Rx sent pt is out of med. Done and pt aware

## 2016-01-30 ENCOUNTER — Ambulatory Visit: Payer: BLUE CROSS/BLUE SHIELD | Admitting: Pain Medicine

## 2016-02-13 ENCOUNTER — Ambulatory Visit (HOSPITAL_BASED_OUTPATIENT_CLINIC_OR_DEPARTMENT_OTHER): Payer: BLUE CROSS/BLUE SHIELD | Admitting: Pain Medicine

## 2016-02-13 ENCOUNTER — Ambulatory Visit
Admission: RE | Admit: 2016-02-13 | Discharge: 2016-02-13 | Disposition: A | Discharge status: Home / Self Care | Payer: BLUE CROSS/BLUE SHIELD | Source: Ambulatory Visit | Attending: Pain Medicine | Admitting: Pain Medicine

## 2016-02-13 ENCOUNTER — Encounter: Payer: Self-pay | Admitting: Pain Medicine

## 2016-02-13 VITALS — BP 161/73 | HR 86 | Temp 96.4°F | Resp 18 | Ht 65.5 in | Wt 232.0 lb

## 2016-02-13 DIAGNOSIS — M25561 Pain in right knee: Secondary | ICD-10-CM | POA: Insufficient documentation

## 2016-02-13 DIAGNOSIS — G8929 Other chronic pain: Secondary | ICD-10-CM | POA: Insufficient documentation

## 2016-02-13 DIAGNOSIS — M25562 Pain in left knee: Secondary | ICD-10-CM | POA: Diagnosis not present

## 2016-02-13 DIAGNOSIS — E039 Hypothyroidism, unspecified: Secondary | ICD-10-CM | POA: Diagnosis not present

## 2016-02-13 DIAGNOSIS — Z8261 Family history of arthritis: Secondary | ICD-10-CM | POA: Insufficient documentation

## 2016-02-13 DIAGNOSIS — G4733 Obstructive sleep apnea (adult) (pediatric): Secondary | ICD-10-CM | POA: Diagnosis not present

## 2016-02-13 DIAGNOSIS — Z825 Family history of asthma and other chronic lower respiratory diseases: Secondary | ICD-10-CM | POA: Diagnosis not present

## 2016-02-13 DIAGNOSIS — M5442 Lumbago with sciatica, left side: Secondary | ICD-10-CM | POA: Insufficient documentation

## 2016-02-13 DIAGNOSIS — E119 Type 2 diabetes mellitus without complications: Secondary | ICD-10-CM | POA: Diagnosis not present

## 2016-02-13 DIAGNOSIS — E7800 Pure hypercholesterolemia, unspecified: Secondary | ICD-10-CM | POA: Diagnosis not present

## 2016-02-13 DIAGNOSIS — Z79899 Other long term (current) drug therapy: Secondary | ICD-10-CM | POA: Insufficient documentation

## 2016-02-13 DIAGNOSIS — M545 Low back pain: Secondary | ICD-10-CM | POA: Diagnosis present

## 2016-02-13 DIAGNOSIS — M5410 Radiculopathy, site unspecified: Secondary | ICD-10-CM | POA: Diagnosis not present

## 2016-02-13 DIAGNOSIS — J45909 Unspecified asthma, uncomplicated: Secondary | ICD-10-CM | POA: Insufficient documentation

## 2016-02-13 DIAGNOSIS — M5126 Other intervertebral disc displacement, lumbar region: Secondary | ICD-10-CM | POA: Diagnosis not present

## 2016-02-13 DIAGNOSIS — M79605 Pain in left leg: Secondary | ICD-10-CM | POA: Diagnosis not present

## 2016-02-13 DIAGNOSIS — Z87891 Personal history of nicotine dependence: Secondary | ICD-10-CM | POA: Insufficient documentation

## 2016-02-13 DIAGNOSIS — Z7951 Long term (current) use of inhaled steroids: Secondary | ICD-10-CM | POA: Insufficient documentation

## 2016-02-13 DIAGNOSIS — E559 Vitamin D deficiency, unspecified: Secondary | ICD-10-CM | POA: Insufficient documentation

## 2016-02-13 DIAGNOSIS — M5441 Lumbago with sciatica, right side: Secondary | ICD-10-CM

## 2016-02-13 DIAGNOSIS — M79604 Pain in right leg: Secondary | ICD-10-CM

## 2016-02-13 MED ORDER — SODIUM CHLORIDE 0.9% FLUSH: 2.0000 mL | Freq: Once | INTRAVENOUS | Status: DC

## 2016-02-13 MED ORDER — MIDAZOLAM HCL 5 MG/5ML IJ SOLN
1.0000 mg | INTRAMUSCULAR | Status: DC | PRN
  Administered 2016-02-13: 2 mg via INTRAVENOUS

## 2016-02-13 MED ORDER — TRIAMCINOLONE ACETONIDE 40 MG/ML IJ SUSP
INTRAMUSCULAR | Status: AC
  Filled 2016-02-13: qty 1

## 2016-02-13 MED ORDER — GLYCOPYRROLATE 0.2 MG/ML IJ SOLN
INTRAMUSCULAR | Status: AC
  Administered 2016-02-13: 0.2 mg via INTRAVENOUS
  Filled 2016-02-13: qty 1

## 2016-02-13 MED ORDER — LIDOCAINE HCL (PF) 1 % IJ SOLN
INTRAMUSCULAR | Status: AC
  Filled 2016-02-13: qty 5

## 2016-02-13 MED ORDER — IOPAMIDOL (ISOVUE-M 200) INJECTION 41%
10.0000 mL | Freq: Once | INTRAMUSCULAR | Status: DC
  Filled 2016-02-13: qty 10

## 2016-02-13 MED ORDER — ROPIVACAINE HCL 2 MG/ML IJ SOLN
INTRAMUSCULAR | Status: AC
  Filled 2016-02-13: qty 10

## 2016-02-13 MED ORDER — TRIAMCINOLONE ACETONIDE 40 MG/ML IJ SUSP
40.0000 mg | Freq: Once | INTRAMUSCULAR | Status: AC
  Administered 2016-02-13: 11:00:00

## 2016-02-13 MED ORDER — FENTANYL CITRATE (PF) 100 MCG/2ML IJ SOLN
INTRAMUSCULAR | Status: AC
  Administered 2016-02-13: 50 ug via INTRAVENOUS
  Filled 2016-02-13: qty 2

## 2016-02-13 MED ORDER — MIDAZOLAM HCL 5 MG/5ML IJ SOLN
INTRAMUSCULAR | Status: AC
  Administered 2016-02-13: 2 mg via INTRAVENOUS
  Filled 2016-02-13: qty 5

## 2016-02-13 MED ORDER — FENTANYL CITRATE (PF) 100 MCG/2ML IJ SOLN
25.0000 ug | INTRAMUSCULAR | Status: DC | PRN
  Administered 2016-02-13: 50 ug via INTRAVENOUS

## 2016-02-13 MED ORDER — LIDOCAINE HCL (PF) 1 % IJ SOLN
10.0000 mL | Freq: Once | INTRAMUSCULAR | Status: AC
  Administered 2016-02-13: 11:00:00
  Filled 2016-02-13: qty 10

## 2016-02-13 MED ORDER — LACTATED RINGERS IV SOLN: 1000.0000 mL | Freq: Once | INTRAVENOUS | Status: DC

## 2016-02-13 MED ORDER — SODIUM CHLORIDE 0.9 % IJ SOLN
INTRAMUSCULAR | Status: AC
  Administered 2016-02-13: 2 mL
  Filled 2016-02-13: qty 10

## 2016-02-13 MED ORDER — ROPIVACAINE HCL 2 MG/ML IJ SOLN
2.0000 mL | Freq: Once | INTRAMUSCULAR | Status: AC
  Administered 2016-02-13: 11:00:00 via EPIDURAL

## 2016-02-13 NOTE — Progress Notes (Signed)
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 information was provided in large part by the patient, it cannot be guaranteed that it has not been purposely or unconsciously manipulated. Every effort has been made to obtain as much relevant data as possible for this evaluation. It is important to note that the conclusions that lead to this procedure are derived in large part from the available data. Always take into account that the treatment will also be dependent on availability of resources and existing treatment guidelines, considered by other Pain Management Practitioners as being common knowledge and practice, at the time of the intervention. For Medico-Legal purposes, it is also important to point out that variation in procedural techniques and pharmacological choices are the acceptable norm. The indications, contraindications, technique, and results of the above procedure should only be interpreted and judged by a Board-Certified Interventional Pain Specialist with extensive familiarity and expertise in the same exact procedure and technique. Attempts at  providing opinions without similar or greater experience and expertise than that of the treating physician will be considered as inappropriate and unethical, and shall result in a formal complaint to the state medical board and applicable specialty societies.  Instructions provided at this appointment: Patient Instructions  Pain Management Discharge Instructions  General Discharge Instructions :  If you need to reach your doctor call: Monday-Friday 8:00 am - 4:00 pm at [phone] or toll free [phone].  After clinic hours [phone] to have operator reach doctor.  Bring all of your medication bottles to all your appointments in the pain clinic.  To cancel or reschedule your appointment with Pain Management please remember to call 24 hours in advance to avoid a fee.  Refer to the educational materials which you have been given on: General Risks, I had my Procedure. Discharge Instructions, Post Sedation.  Post Procedure Instructions:  The drugs you were given will stay in your system until tomorrow, so for the next 24 hours you should not drive, make any legal decisions or drink any alcoholic beverages.  You may eat anything you prefer, but it is better to start with liquids then soups and crackers, and gradually work up to solid foods.  Please notify your doctor immediately if you have any unusual bleeding, trouble breathing or pain that is not related to your normal pain.  Depending on the type of procedure that was done, some parts of your body may feel week and/or numb.  This usually clears up by tonight or the next day.  Walk with the use of an assistive device or accompanied by an adult for the 24 hours.  You may use ice on the affected area for the first 24 hours.  Put ice in a Ziploc bag and cover with a towel  and place against area 15 minutes on 15 minutes off.  You may switch to heat after 24 hours.Epidural Steroid Injection Patient Information  Description: The  epidural space surrounds the nerves as they exit the spinal cord.  In some patients, the nerves can be compressed and inflamed by a bulging disc or a tight spinal canal (spinal stenosis).  By injecting steroids into the epidural space, we can bring irritated nerves into direct contact with a potentially helpful medication.  These steroids act directly on the irritated nerves and can reduce swelling and inflammation which often leads to decreased pain.  Epidural steroids may be injected anywhere along the spine and from the neck to the low back depending upon the location of your pain.   After numbing the skin with local anesthetic (like Novocaine), a small needle is passed into the epidural space slowly.  You may experience a sensation of pressure while this is being done.  The entire block usually last less than 10 minutes.  Conditions which may be treated by epidural steroids:   Low back and leg pain  Neck and arm pain  Spinal stenosis  Post-laminectomy syndrome  Herpes zoster (shingles) pain  Pain from compression fractures  Preparation for the injection:  1. Do not eat any solid food or dairy products within 8 hours of your appointment.  2. You may drink clear liquids up to 3 hours before appointment.  Clear liquids include water, black coffee, juice or soda.  No milk or cream please. 3. You may take your regular medication, including pain medications, with a sip of water before your appointment  Diabetics should hold regular insulin (if taken separately) and take 1/2 normal NPH dos the morning of the procedure.  Carry some sugar containing items with you to your appointment. 4. A driver must accompany you and be prepared to drive you home after your procedure.  5. Bring all your current medications with your. 6. An IV may be inserted and sedation may be given at the discretion of the physician.   7. A blood pressure cuff, EKG and other monitors will often be applied during the  procedure.  Some patients may need to have extra oxygen administered for a short period. 8. You will be asked to provide medical information, including your allergies, prior to the procedure.  We must know immediately if you are taking blood thinners (like Coumadin/Warfarin)  Or if you are allergic to IV iodine contrast (dye). We must know if you could possible be pregnant.  Possible side-effects:  Bleeding from needle site  Infection (rare, may require surgery)  Nerve injury (rare)  Numbness & tingling (temporary)  Difficulty urinating (rare, temporary)  Spinal headache ( a headache worse with upright posture)  Light -headedness (temporary)  Pain at injection site (several days)  Decreased blood pressure (temporary)  Weakness in arm/leg (temporary)  Pressure sensation in back/neck (temporary)  Call if you experience:  Fever/chills associated with headache or increased back/neck pain.  Headache worsened by an upright position.  New onset weakness or numbness of an extremity below the injection site  Hives or difficulty breathing (go to the emergency room)  Inflammation or drainage at the infection site  Severe back/neck pain  Any new symptoms which are concerning to you  Please note:  Although the local anesthetic injected can often make your back or neck feel good for several hours after the injection, the pain will likely return.  It takes 3-7 days for steroids to work  in the epidural space.  You may not notice any pain relief for at least that one week.  If effective, we will often do a series of three injections spaced 3-6 weeks apart to maximally decrease your pain.  After the initial series, we generally will wait several months before considering a repeat injection of the same type.  If you have any questions, please call [phone] [facility]

## 2016-02-13 NOTE — Patient Instructions (Signed)
Pain Management Discharge Instructions  General Discharge Instructions :  If you need to reach your doctor call: Monday-Friday 8:00 am - 4:00 pm at [phone] or toll free [phone].  After clinic hours [phone] to have operator reach doctor.  Bring all of your medication bottles to all your appointments in the pain clinic.  To cancel or reschedule your appointment with Pain Management please remember to call 24 hours in advance to avoid a fee.  Refer to the educational materials which you have been given on: General Risks, I had my Procedure. Discharge Instructions, Post Sedation.  Post Procedure Instructions:  The drugs you were given will stay in your system until tomorrow, so for the next 24 hours you should not drive, make any legal decisions or drink any alcoholic beverages.  You may eat anything you prefer, but it is better to start with liquids then soups and crackers, and gradually work up to solid foods.  Please notify your doctor immediately if you have any unusual bleeding, trouble breathing or pain that is not related to your normal pain.  Depending on the type of procedure that was done, some parts of your body may feel week and/or numb.  This usually clears up by tonight or the next day.  Walk with the use of an assistive device or accompanied by an adult for the 24 hours.  You may use ice on the affected area for the first 24 hours.  Put ice in a Ziploc bag and cover with a towel and place against area 15 minutes on 15 minutes off.  You may switch to heat after 24 hours.Epidural Steroid Injection Patient Information  Description: The epidural space surrounds the nerves as they exit the spinal cord.  In some patients, the nerves can be compressed and inflamed by a bulging disc or a tight spinal canal (spinal stenosis).  By injecting steroids into the epidural space, we can bring irritated nerves into direct contact with a potentially helpful medication.  These  steroids act directly on the irritated nerves and can reduce swelling and inflammation which often leads to decreased pain.  Epidural steroids may be injected anywhere along the spine and from the neck to the low back depending upon the location of your pain.   After numbing the skin with local anesthetic (like Novocaine), a small needle is passed into the epidural space slowly.  You may experience a sensation of pressure while this is being done.  The entire block usually last less than 10 minutes.  Conditions which may be treated by epidural steroids:   Low back and leg pain  Neck and arm pain  Spinal stenosis  Post-laminectomy syndrome  Herpes zoster (shingles) pain  Pain from compression fractures  Preparation for the injection:  1. Do not eat any solid food or dairy products within 8 hours of your appointment.  2. You may drink clear liquids up to 3 hours before appointment.  Clear liquids include water, black coffee, juice or soda.  No milk or cream please. 3. You may take your regular medication, including pain medications, with a sip of water before your appointment  Diabetics should hold regular insulin (if taken separately) and take 1/2 normal NPH dos the morning of the procedure.  Carry some sugar containing items with you to your appointment. 4. A driver must accompany you and be prepared to drive you home after your procedure.  5. Bring all your current medications with your. 6. An IV may be inserted and   sedation may be given at the discretion of the physician.   7. A blood pressure cuff, EKG and other monitors will often be applied during the procedure.  Some patients may need to have extra oxygen administered for a short period. 8. You will be asked to provide medical information, including your allergies, prior to the procedure.  We must know immediately if you are taking blood thinners (like Coumadin/Warfarin)  Or if you are allergic to IV iodine contrast (dye). We must  know if you could possible be pregnant.  Possible side-effects:  Bleeding from needle site  Infection (rare, may require surgery)  Nerve injury (rare)  Numbness & tingling (temporary)  Difficulty urinating (rare, temporary)  Spinal headache ( a headache worse with upright posture)  Light -headedness (temporary)  Pain at injection site (several days)  Decreased blood pressure (temporary)  Weakness in arm/leg (temporary)  Pressure sensation in back/neck (temporary)  Call if you experience:  Fever/chills associated with headache or increased back/neck pain.  Headache worsened by an upright position.  New onset weakness or numbness of an extremity below the injection site  Hives or difficulty breathing (go to the emergency room)  Inflammation or drainage at the infection site  Severe back/neck pain  Any new symptoms which are concerning to you  Please note:  Although the local anesthetic injected can often make your back or neck feel good for several hours after the injection, the pain will likely return.  It takes 3-7 days for steroids to work in the epidural space.  You may not notice any pain relief for at least that one week.  If effective, we will often do a series of three injections spaced 3-6 weeks apart to maximally decrease your pain.  After the initial series, we generally will wait several months before considering a repeat injection of the same type.  If you have any questions, please call [phone] [facility] 

## 2016-02-13 NOTE — Progress Notes (Signed)
Safety precautions to be maintained throughout the outpatient stay will include: orient to surroundings, keep bed in low position, maintain call bell within reach at all times, provide assistance with transfer out of bed and ambulation.  

## 2016-02-14 ENCOUNTER — Telehealth: Payer: Self-pay | Admitting: *Deleted

## 2016-02-14 NOTE — Telephone Encounter (Signed)
LVM for patient to return call for any concerns post procedure.

## 2016-03-08 ENCOUNTER — Other Ambulatory Visit: Payer: Self-pay | Admitting: Primary Care

## 2016-03-26 ENCOUNTER — Ambulatory Visit
Admission: RE | Admit: 2016-03-26 | Discharge: 2016-03-26 | Disposition: A | Discharge status: Home / Self Care | Payer: BLUE CROSS/BLUE SHIELD | Source: Ambulatory Visit | Attending: Pain Medicine | Admitting: Pain Medicine

## 2016-03-26 ENCOUNTER — Encounter: Payer: Self-pay | Admitting: Pain Medicine

## 2016-03-26 ENCOUNTER — Ambulatory Visit: Payer: BLUE CROSS/BLUE SHIELD | Attending: Pain Medicine | Admitting: Pain Medicine

## 2016-03-26 VITALS — BP 155/75 | HR 77 | Temp 98.5°F | Resp 16 | Ht 65.0 in | Wt 232.0 lb

## 2016-03-26 DIAGNOSIS — G8929 Other chronic pain: Secondary | ICD-10-CM | POA: Insufficient documentation

## 2016-03-26 DIAGNOSIS — M5442 Lumbago with sciatica, left side: Secondary | ICD-10-CM | POA: Insufficient documentation

## 2016-03-26 DIAGNOSIS — M79605 Pain in left leg: Secondary | ICD-10-CM | POA: Diagnosis not present

## 2016-03-26 DIAGNOSIS — Z9889 Other specified postprocedural states: Secondary | ICD-10-CM | POA: Diagnosis not present

## 2016-03-26 DIAGNOSIS — M47897 Other spondylosis, lumbosacral region: Secondary | ICD-10-CM | POA: Insufficient documentation

## 2016-03-26 DIAGNOSIS — M5410 Radiculopathy, site unspecified: Secondary | ICD-10-CM | POA: Diagnosis not present

## 2016-03-26 DIAGNOSIS — M47896 Other spondylosis, lumbar region: Secondary | ICD-10-CM | POA: Diagnosis not present

## 2016-03-26 DIAGNOSIS — M79604 Pain in right leg: Secondary | ICD-10-CM | POA: Diagnosis not present

## 2016-03-26 DIAGNOSIS — Z87891 Personal history of nicotine dependence: Secondary | ICD-10-CM | POA: Insufficient documentation

## 2016-03-26 DIAGNOSIS — Z79899 Other long term (current) drug therapy: Secondary | ICD-10-CM | POA: Insufficient documentation

## 2016-03-26 DIAGNOSIS — M4317 Spondylolisthesis, lumbosacral region: Secondary | ICD-10-CM | POA: Diagnosis not present

## 2016-03-26 NOTE — Progress Notes (Signed)
Safety precautions to be maintained throughout the outpatient stay will include: orient to surroundings, keep bed in low position, maintain call bell within reach at all times, provide assistance with transfer out of bed and ambulation.  

## 2016-03-26 NOTE — Patient Instructions (Signed)
GENERAL RISKS AND COMPLICATIONS  What are the risk, side effects and possible complications? Generally speaking, most procedures are safe.  However, with any procedure there are risks, side effects, and the possibility of complications.  The risks and complications are dependent upon the sites that are lesioned, or the type of nerve block to be performed.  The closer the procedure is to the spine, the more serious the risks are.  Great care is taken when placing the radio frequency needles, block needles or lesioning probes, but sometimes complications can occur. 1. Infection: Any time there is an injection through the skin, there is a risk of infection.  This is why sterile conditions are used for these blocks.  There are four possible types of infection. 1. Localized skin infection. 2. Central Nervous System Infection-This can be in the form of Meningitis, which can be deadly. 3. Epidural Infections-This can be in the form of an epidural abscess, which can cause pressure inside of the spine, causing compression of the spinal cord with subsequent paralysis. This would require an emergency surgery to decompress, and there are no guarantees that the patient would recover from the paralysis. 4. Discitis-This is an infection of the intervertebral discs.  It occurs in about 1% of discography procedures.  It is difficult to treat and it may lead to surgery.        2. Pain: the needles have to go through skin and soft tissues, will cause soreness.       3. Damage to internal structures:  The nerves to be lesioned may be near blood vessels or    other nerves which can be potentially damaged.       4. Bleeding: Bleeding is more common if the patient is taking blood thinners such as  aspirin, Coumadin, Ticiid, Plavix, etc., or if he/she have some genetic predisposition  such as hemophilia. Bleeding into the spinal canal can cause compression of the spinal  cord with subsequent paralysis.  This would require an  emergency surgery to  decompress and there are no guarantees that the patient would recover from the  paralysis.       5. Pneumothorax:  Puncturing of a lung is a possibility, every time a needle is introduced in  the area of the chest or upper back.  Pneumothorax refers to free air around the  collapsed lung(s), inside of the thoracic cavity (chest cavity).  Another two possible  complications related to a similar event would include: Hemothorax and Chylothorax.   These are variations of the Pneumothorax, where instead of air around the collapsed  lung(s), you may have blood or chyle, respectively.       6. Spinal headaches: They may occur with any procedures in the area of the spine.       7. Persistent CSF (Cerebro-Spinal Fluid) leakage: This is a rare problem, but may occur  with prolonged intrathecal or epidural catheters either due to the formation of a fistulous  track or a dural tear.       8. Nerve damage: By working so close to the spinal cord, there is always a possibility of  nerve damage, which could be as serious as a permanent spinal cord injury with  paralysis.       9. Death:  Although rare, severe deadly allergic reactions known as "Anaphylactic  reaction" can occur to any of the medications used.      10. Worsening of the symptoms:  We can always make thing worse.    What are the chances of something like this happening? Chances of any of this occuring are extremely low.  By statistics, you have more of a chance of getting killed in a motor vehicle accident: while driving to the hospital than any of the above occurring .  Nevertheless, you should be aware that they are possibilities.  In general, it is similar to taking a shower.  Everybody knows that you can slip, hit your head and get killed.  Does that mean that you should not shower again?  Nevertheless always keep in mind that statistics do not mean anything if you happen to be on the wrong side of them.  Even if a procedure has a 1  (one) in a 1,000,000 (million) chance of going wrong, it you happen to be that one..Also, keep in mind that by statistics, you have more of a chance of having something go wrong when taking medications.  Who should not have this procedure? If you are on a blood thinning medication (e.g. Coumadin, Plavix, see list of "Blood Thinners"), or if you have an active infection going on, you should not have the procedure.  If you are taking any blood thinners, please inform your physician.  How should I prepare for this procedure?  Do not eat or drink anything at least six hours prior to the procedure.  Bring a driver with you .  It cannot be a taxi.  Come accompanied by an adult that can drive you back, and that is strong enough to help you if your legs get weak or numb from the local anesthetic.  Take all of your medicines the morning of the procedure with just enough water to swallow them.  If you have diabetes, make sure that you are scheduled to have your procedure done first thing in the morning, whenever possible.  If you have diabetes, take only half of your insulin dose and notify our nurse that you have done so as soon as you arrive at the clinic.  If you are diabetic, but only take blood sugar pills (oral hypoglycemic), then do not take them on the morning of your procedure.  You may take them after you have had the procedure.  Do not take aspirin or any aspirin-containing medications, at least eleven (11) days prior to the procedure.  They may prolong bleeding.  Wear loose fitting clothing that may be easy to take off and that you would not mind if it got stained with Betadine or blood.  Do not wear any jewelry or perfume  Remove any nail coloring.  It will interfere with some of our monitoring equipment.  NOTE: Remember that this is not meant to be interpreted as a complete list of all possible complications.  Unforeseen problems may occur.  BLOOD THINNERS The following drugs  contain aspirin or other products, which can cause increased bleeding during surgery and should not be taken for 2 weeks prior to and 1 week after surgery.  If you should need take something for relief of minor pain, you may take acetaminophen which is found in Tylenol,m Datril, Anacin-3 and Panadol. It is not blood thinner. The products listed below are.  Do not take any of the products listed below in addition to any listed on your instruction sheet.  A.P.C or A.P.C with Codeine Codeine Phosphate Capsules #3 Ibuprofen Ridaura  ABC compound Congesprin Imuran rimadil  Advil Cope Indocin Robaxisal  Alka-Seltzer Effervescent Pain Reliever and Antacid Coricidin or Coricidin-D  Indomethacin Rufen    Alka-Seltzer plus Cold Medicine Cosprin Ketoprofen S-A-C Tablets  Anacin Analgesic Tablets or Capsules Coumadin Korlgesic Salflex  Anacin Extra Strength Analgesic tablets or capsules CP-2 Tablets Lanoril Salicylate  Anaprox Cuprimine Capsules Levenox Salocol  Anexsia-D Dalteparin Magan Salsalate  Anodynos Darvon compound Magnesium Salicylate Sine-off  Ansaid Dasin Capsules Magsal Sodium Salicylate  Anturane Depen Capsules Marnal Soma  APF Arthritis pain formula Dewitt's Pills Measurin Stanback  Argesic Dia-Gesic Meclofenamic Sulfinpyrazone  Arthritis Bayer Timed Release Aspirin Diclofenac Meclomen Sulindac  Arthritis pain formula Anacin Dicumarol Medipren Supac  Analgesic (Safety coated) Arthralgen Diffunasal Mefanamic Suprofen  Arthritis Strength Bufferin Dihydrocodeine Mepro Compound Suprol  Arthropan liquid Dopirydamole Methcarbomol with Aspirin Synalgos  ASA tablets/Enseals Disalcid Micrainin Tagament  Ascriptin Doan's Midol Talwin  Ascriptin A/D Dolene Mobidin Tanderil  Ascriptin Extra Strength Dolobid Moblgesic Ticlid  Ascriptin with Codeine Doloprin or Doloprin with Codeine Momentum Tolectin  Asperbuf Duoprin Mono-gesic Trendar  Aspergum Duradyne Motrin or Motrin IB Triminicin  Aspirin  plain, buffered or enteric coated Durasal Myochrisine Trigesic  Aspirin Suppositories Easprin Nalfon Trillsate  Aspirin with Codeine Ecotrin Regular or Extra Strength Naprosyn Uracel  Atromid-S Efficin Naproxen Ursinus  Auranofin Capsules Elmiron Neocylate Vanquish  Axotal Emagrin Norgesic Verin  Azathioprine Empirin or Empirin with Codeine Normiflo Vitamin E  Azolid Emprazil Nuprin Voltaren  Bayer Aspirin plain, buffered or children's or timed BC Tablets or powders Encaprin Orgaran Warfarin Sodium  Buff-a-Comp Enoxaparin Orudis Zorpin  Buff-a-Comp with Codeine Equegesic Os-Cal-Gesic   Buffaprin Excedrin plain, buffered or Extra Strength Oxalid   Bufferin Arthritis Strength Feldene Oxphenbutazone   Bufferin plain or Extra Strength Feldene Capsules Oxycodone with Aspirin   Bufferin with Codeine Fenoprofen Fenoprofen Pabalate or Pabalate-SF   Buffets II Flogesic Panagesic   Buffinol plain or Extra Strength Florinal or Florinal with Codeine Panwarfarin   Buf-Tabs Flurbiprofen Penicillamine   Butalbital Compound Four-way cold tablets Penicillin   Butazolidin Fragmin Pepto-Bismol   Carbenicillin Geminisyn Percodan   Carna Arthritis Reliever Geopen Persantine   Carprofen Gold's salt Persistin   Chloramphenicol Goody's Phenylbutazone   Chloromycetin Haltrain Piroxlcam   Clmetidine heparin Plaquenil   Cllnoril Hyco-pap Ponstel   Clofibrate Hydroxy chloroquine Propoxyphen         Before stopping any of these medications, be sure to consult the physician who ordered them.  Some, such as Coumadin (Warfarin) are ordered to prevent or treat serious conditions such as "deep thrombosis", "pumonary embolisms", and other heart problems.  The amount of time that you may need off of the medication may also vary with the medication and the reason for which you were taking it.  If you are taking any of these medications, please make sure you notify your pain physician before you undergo any  procedures.         Epidural Steroid Injection An epidural steroid injection is a shot of steroid medicine and numbing medicine that is given into the space between the spinal cord and the bones in your back (epidural space). The shot helps relieve pain caused by an irritated or swollen nerve root. The amount of pain relief you get from the injection depends on what is causing the nerve to be swollen and irritated, and how long your pain lasts. You are more likely to benefit from this injection if your pain is strong and comes on suddenly rather than if you have had pain for a long time. Tell a health care provider about:  Any allergies you have.  All medicines you are taking, including vitamins, herbs,   eye drops, creams, and over-the-counter medicines.  Any problems you or family members have had with anesthetic medicines.  Any blood disorders you have.  Any surgeries you have had.  Any medical conditions you have.  Whether you are pregnant or may be pregnant. What are the risks? Generally, this is a safe procedure. However, problems may occur, including:  Headache.  Bleeding.  Infection.  Allergic reaction to medicines.  Damage to your nerves.  What happens before the procedure? Staying hydrated Follow instructions from your health care provider about hydration, which may include:  Up to 2 hours before the procedure - you may continue to drink clear liquids, such as water, clear fruit juice, black coffee, and plain tea.  Eating and drinking restrictions Follow instructions from your health care provider about eating and drinking, which may include:  8 hours before the procedure - stop eating heavy meals or foods such as meat, fried foods, or fatty foods.  6 hours before the procedure - stop eating light meals or foods, such as toast or cereal.  6 hours before the procedure - stop drinking milk or drinks that contain milk.  2 hours before the procedure - stop  drinking clear liquids.  Medicine  You may be given medicines to lower anxiety.  Ask your health care provider about: ? Changing or stopping your regular medicines. This is especially important if you are taking diabetes medicines or blood thinners. ? Taking medicines such as aspirin and ibuprofen. These medicines can thin your blood. Do not take these medicines before your procedure if your health care provider instructs you not to. General instructions  Plan to have someone take you home from the hospital or clinic. What happens during the procedure?  You may receive a medicine to help you relax (sedative).  You will be asked to lie on your abdomen.  The injection site will be cleaned.  A numbing medicine (local anesthetic) will be used to numb the injection site.  A needle will be inserted through your skin into the epidural space. You may feel some discomfort when this happens. An X-ray machine will be used to make sure the needle is put as close as possible to the affected nerve.  A steroid medicine and a local anesthetic will be injected into the epidural space.  The needle will be removed.  A bandage (dressing) will be put over the injection site. What happens after the procedure?  Your blood pressure, heart rate, breathing rate, and blood oxygen level will be monitored until the medicines you were given have worn off.  Your arm or leg may feel weak or numb for a few hours.  The injection site may feel sore.  Do not drive for 24 hours if you received a sedative. This information is not intended to replace advice given to you by your health care provider. Make sure you discuss any questions you have with your health care provider. Document Released: 06/10/2007 Document Revised: 08/15/2015 Document Reviewed: 06/19/2015 [employer] Interactive Patient Education  2017 [employer]  

## 2016-03-26 NOTE — Progress Notes (Signed)
Patient's Name: Molly Wall  MRN: [id number]  Referring Provider: [name], NP  DOB: January 02, 1946  PCP: [name], NP  DOS: 03/26/2016  Note by: Kathlen Brunswick. Dossie Arbour, MD  Service setting: Ambulatory outpatient  Specialty: Interventional Pain Management  Location: [facility] (AMB) Pain Management Facility    Patient type: Established   Primary Reason(s) for Visit: Encounter for post-procedure evaluation of chronic illness with mild to moderate exacerbation CC: Back Pain (lower)  HPI  Molly Wall is a 71 y.o. year old, female patient, who comes today for a post-procedure evaluation. 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 Her primarily concern today is the Back Pain (lower)  Pain Assessment: Self-Reported Pain Score: 1 /10             Reported level is compatible with observation.       Pain Type: Chronic pain Pain Location: Back Pain Orientation: Lower, Left, Right Pain Descriptors / Indicators: Aching, Discomfort, Constant Pain Frequency: Constant (varies in intensity)  Molly Wall comes in today for post-procedure evaluation after the treatment done on 02/13/2016.  Further details on both, my assessment(s), as well as the proposed treatment plan, please see below.  Post-Procedure Assessment  02/13/2016 Procedure: Therapeutic right-sided L4-5 interlaminar laminar lumbar epidural steroid injection under fluoroscopic guidance and IV sedation Post-procedure pain score: 0/10 (100% relief) Influential Factors: BMI: 38.61 kg/m Intra-procedural challenges: None observed Assessment challenges: None detected         Post-procedural side-effects, adverse reactions, or complications: None reported Reported  issues: None  Sedation: Sedation provided. When no sedatives are used, the analgesic levels obtained are directly associated to the effectiveness of the local anesthetics. However, when sedation is provided, the level of analgesia obtained during the initial 1 hour following the intervention, is believed to be the result of a combination of factors. These factors may include, but are not limited to: 1. The effectiveness of the local anesthetics used. 2. The effects of the analgesic(s) and/or anxiolytic(s) used. 3. The degree of discomfort experienced by the patient at the time of the procedure. 4. The patients ability and reliability in recalling and recording the events. 5. The presence and influence of possible secondary gains and/or psychosocial factors. Reported result: Relief experienced during the 1st hour after the procedure: 100 % (Ultra-Short Term Relief) Interpretative annotation: Analgesia during this period is likely to be Local Anesthetic and/or IV Sedative (Analgesic/Anxiolitic) related.          Effects of local anesthetic: The analgesic effects attained during this period are directly associated to the localized infiltration of local anesthetics and therefore cary significant diagnostic value as to the etiological location, or anatomical origin, of the pain. Expected duration of relief is directly dependent on the pharmacodynamics of the local anesthetic used. Long-acting (4-6 hours) anesthetics used.  Reported result: Relief during the next 4 to 6 hour after the procedure: 90 % (Short-Term Relief) Interpretative annotation: Complete relief would suggest area to be the source of the pain.          Long-term benefit: Defined as the period of time past the expected duration of local anesthetics. With the possible exception of prolonged sympathetic blockade from the local anesthetics, benefits during this period are typically attributed to, or associated with, other factors such as  analgesic sensory neuropraxia, antiinflammatory effects, or beneficial biochemical changes provided by agents other than the local anesthetics Reported result: Extended relief following procedure: 50 % (heel continues to have a little pain but is better over the past 2 weeks. ) (Long-Term Relief) Interpretative annotation: Good relief. This could suggest inflammation to be a significant component in the etiology to the pain.          Current benefits: Defined as persistent relief that continues at this point in time.   Reported results: Treated area: <50 % In addition, the patient reports improvement in function Interpretative annotation: Ongoing benefits would suggest effective therapeutic approach  Interpretation: Results would suggest that repeating the procedure may be necessary, for therapeutic reasons  Laboratory Chemistry  Inflammation Markers No results found for: ESRSEDRATE, CRP Renal Function Lab Results  Component Value Date   BUN 19 01/09/2016   CREATININE 0.81 01/09/2016   Hepatic Function Lab Results  Component Value Date   AST 14 07/16/2015   ALT 17 07/16/2015   ALBUMIN 4.2 07/16/2015   Electrolytes Lab Results  Component Value Date   NA 138 01/09/2016   K 4.0 01/09/2016   CL 100 01/09/2016   CALCIUM 9.6 01/09/2016   Pain Modulating Vitamins Lab Results  Component Value Date   VD25OH 26.52 (L) 01/09/2016   Coagulation Parameters Lab Results  Component Value Date   PLT 243.0 01/09/2016   Cardiovascular Lab Results  Component Value Date   HGB 13.7 01/09/2016   HCT 41.4 01/09/2016   Note: Lab results reviewed.  Recent Diagnostic Imaging Review  Dg C-arm 1-60 Min-no Report  Result Date: 03/05/2016 Fluoroscopy was utilized by the requesting physician.  No radiographic interpretation.   Note: Imaging results reviewed.          Meds  The patient has a current medication list which includes the following prescription(s): albuterol, atorvastatin,  fluticasone, fluticasone-salmeterol, levothyroxine, and montelukast.  Current Outpatient Prescriptions on File Prior to Visit  Medication Sig  . albuterol (PROVENTIL HFA;VENTOLIN HFA) 108 (90 Base) MCG/ACT inhaler Inhale 2 puffs into the lungs every 6 (six) hours as needed for wheezing or shortness of breath.  Marland Kitchen atorvastatin (LIPITOR) 10 MG tablet TAKE 1 TABLET DAILY  . fluticasone (FLONASE) 50 MCG/ACT nasal spray Place 2 sprays into both nostrils daily.  . Fluticasone-Salmeterol (ADVAIR) 250-50 MCG/DOSE AEPB Inhale 1 puff into the lungs 2 (two) times daily.  Marland Kitchen levothyroxine (SYNTHROID, LEVOTHROID) 175 MCG tablet Take 1 tablet (175 mcg total) by mouth daily.  . montelukast (SINGULAIR) 10 MG tablet Take 10 mg by mouth at bedtime.   No current facility-administered medications on file prior to visit.    ROS  Constitutional: Denies any fever or chills Gastrointestinal: No reported hemesis, hematochezia, vomiting, or acute GI distress Musculoskeletal: Denies any acute onset joint swelling, redness, loss of ROM, or weakness Neurological: No reported episodes of acute onset apraxia, aphasia, dysarthria, agnosia, amnesia, paralysis, loss of coordination, or loss of consciousness  Allergies  Molly Wall has No Known Allergies.  PFSH  Drug: [name]  has no drug history on file. Alcohol:  reports that she drinks alcohol. Tobacco:  reports that she has quit smoking. Her smoking use included Cigarettes. She has never used smokeless tobacco. Medical:  has a past medical history of Allergic rhinitis; Asthma; Diabetes mellitus without complication ([place]); Hypercholesteremia; Hypothyroidism; Lumbar herniated disc; OSA (obstructive sleep apnea); and Vitamin D deficiency. Family: family history includes Alzheimer's disease in her mother; Arthritis in her father; Asthma in  her mother; Cancer in her sister.  Past Surgical History:  Procedure Laterality Date  . APPENDECTOMY    . GANGLION CYST EXCISION       71 years old  . KNEE SURGERY  2014  . SHOULDER SURGERY  1966   Constitutional Exam  General appearance: Well nourished, well developed, and well hydrated. In no apparent acute distress Vitals:   03/26/16 1344  BP: (!) 155/75  Pulse: 77  Resp: 16  Temp: 98.5 F (36.9 C)  TempSrc: Oral  SpO2: 96%  Weight: 232 lb (105.2 kg)  Height: 5\' 5"  (1.651 m)   BMI Assessment: Estimated body mass index is 38.61 kg/m as calculated from the following:   Height as of this encounter: 5\' 5"  (1.651 m).   Weight as of this encounter: 232 lb (105.2 kg).  BMI interpretation table: BMI level Category Range association with higher incidence of chronic pain  <18 kg/m2 Underweight   18.5-24.9 kg/m2 Ideal body weight   25-29.9 kg/m2 Overweight Increased incidence by 20%  30-34.9 kg/m2 Obese (Class I) Increased incidence by 68%  35-39.9 kg/m2 Severe obesity (Class II) Increased incidence by 136%  >40 kg/m2 Extreme obesity (Class III) Increased incidence by 254%   BMI Readings from Last 4 Encounters:  03/26/16 38.61 kg/m  02/13/16 38.02 kg/m  01/09/16 41.54 kg/m  09/10/15 41.16 kg/m   Wt Readings from Last 4 Encounters:  03/26/16 232 lb (105.2 kg)  02/13/16 232 lb (105.2 kg)  01/09/16 242 lb (109.8 kg)  09/10/15 239 lb 12.8 oz (108.8 kg)  Psych/Mental status: Alert, oriented x 3 (person, place, & time) Eyes: PERLA Respiratory: No evidence of acute respiratory distress  Cervical Spine Exam  Inspection: No masses, redness, or swelling Alignment: Symmetrical Functional ROM: Unrestricted ROM Stability: No instability detected Muscle strength & Tone: Functionally intact Sensory: Unimpaired Palpation: Non-contributory  Upper Extremity (UE) Exam    Side: Right upper extremity  Side: Left upper extremity  Inspection: No masses, redness, swelling, or asymmetry  Inspection: No masses, redness, swelling, or asymmetry  Functional ROM: Unrestricted ROM          Functional ROM: Unrestricted ROM           Muscle strength & Tone: Functionally intact  Muscle strength & Tone: Functionally intact  Sensory: Unimpaired  Sensory: Unimpaired  Palpation: Non-contributory  Palpation: Non-contributory   Thoracic Spine Exam  Inspection: No masses, redness, or swelling Alignment: Symmetrical Functional ROM: Unrestricted ROM Stability: No instability detected Sensory: Unimpaired Muscle strength & Tone: Functionally intact Palpation: Non-contributory  Lumbar Spine Exam  Inspection: No masses, redness, or swelling Alignment: Symmetrical Functional ROM: Improved after treatment Stability: No instability detected Muscle strength & Tone: Functionally intact Sensory: Unimpaired Palpation: Non-contributory Provocative Tests: Lumbar Hyperextension and rotation test: evaluation deferred today       Patrick's Maneuver: evaluation deferred today              Gait & Posture Assessment  Ambulation: Unassisted Gait: Relatively normal for age and body habitus Posture: WNL   Lower Extremity Exam    Side: Right lower extremity  Side: Left lower extremity  Inspection: No masses, redness, swelling, or asymmetry  Inspection: No masses, redness, swelling, or asymmetry  Functional ROM: Unrestricted ROM          Functional ROM: Unrestricted ROM          Muscle strength & Tone: Functionally intact  Muscle strength & Tone: Functionally intact  Sensory: Unimpaired  Sensory: Unimpaired  Palpation: Non-contributory  Palpation: Non-contributory   Assessment  Primary Diagnosis & Pertinent Problem List: The primary encounter diagnosis was 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 Diagnoses of Chronic pain of left lower extremity and Chronic bilateral low back pain with left-sided sciatica were also pertinent to this visit.  Status Diagnosis  Stable Stable Stable 1. 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 Chronic pain of left lower extremity   3. Chronic bilateral low back pain with  left-sided sciatica      Plan of Care  Pharmacotherapy (Medications Ordered): No orders of the defined types were placed in this encounter.  New Prescriptions   No medications on file   Medications administered today: [name] had no medications administered during this visit. Lab-work, procedure(s), and/or referral(s): Orders Placed This Encounter  Procedures  . Lumbar Epidural Injection  . DG Lumbar Spine Complete W/Bend   Imaging and/or referral(s): None  Interventional therapies: Planned, scheduled, and/or pending:   Right-sided L4-5 lumbar epidural steroid injection #2    Considering:   Series of 3 right-sided L4-5 lumbar epidural steroid injections under fluoroscopic guidance and IV sedation  Possible diagnostic bilateral lumbar facet block under fluoroscopic guidance and IV sedation    Palliative PRN treatment(s):   Not at this time.   Provider-requested follow-up: Return for procedure (ASAA): LESI.  Future Appointments Date Time Provider [place]  04/09/2016 9:15 AM Milinda Pointer, MD [facility] None  04/10/2016 11:00 AM [facility]   Primary Care Physician: [name], NP Location: [facility] Note by: Kathlen Brunswick. Dossie Arbour, M.D, DABA, DABAPM, DABPM, DABIPP, FIPP Date: 03/26/16; Time: 9:38 PM  Pain Score Disclaimer: We use the NRS-11 scale. This is a self-reported, subjective measurement of pain severity with only modest accuracy. It is used primarily to identify changes within a particular patient. It must be understood that outpatient pain scales are significantly less accurate that those used for research, where they can be applied under ideal controlled circumstances with minimal exposure to variables. In reality, the score is likely to be a combination of pain intensity and pain affect, where pain affect describes the degree of emotional arousal or changes in action readiness caused by the  sensory experience of pain. Factors such as social and work situation, setting, emotional state, anxiety levels, expectation, and prior pain experience may influence pain perception and show large inter-individual differences that may also be affected by time variables.  Patient instructions provided during this appointment: Patient Instructions   GENERAL RISKS AND COMPLICATIONS  What are the risk, side effects and possible complications? Generally speaking, most procedures are safe.  However, with any procedure there are risks, side effects, and the possibility of complications.  The risks and complications are dependent upon the sites that are lesioned, or the type of nerve block to be performed.  The closer the procedure is to the spine, the more serious the risks are.  Great care is taken when placing the radio frequency needles, block needles or lesioning probes, but sometimes complications can occur. 1. Infection: Any time there is an injection through the skin, there is a risk of infection.  This is why sterile conditions are used for these blocks.  There are four possible types of infection. 1. Localized skin infection. 2. Central Nervous System Infection-This can be in the form of Meningitis, which can be deadly. 3. Epidural Infections-This can be in the form of an epidural abscess, which can cause pressure inside of the spine,  causing compression of the spinal cord with subsequent paralysis. This would require an emergency surgery to decompress, and there are no guarantees that the patient would recover from the paralysis. 4. Discitis-This is an infection of the intervertebral discs.  It occurs in about 1% of discography procedures.  It is difficult to treat and it may lead to surgery.        2. Pain: the needles have to go through skin and soft tissues, will cause soreness.       3. Damage to internal structures:  The nerves to be lesioned may be near blood vessels or    other nerves  which can be potentially damaged.       4. Bleeding: Bleeding is more common if the patient is taking blood thinners such as  aspirin, Coumadin, Ticiid, Plavix, etc., or if he/she have some genetic predisposition  such as hemophilia. Bleeding into the spinal canal can cause compression of the spinal  cord with subsequent paralysis.  This would require an emergency surgery to  decompress and there are no guarantees that the patient would recover from the  paralysis.       5. Pneumothorax:  Puncturing of a lung is a possibility, every time a needle is introduced in  the area of the chest or upper back.  Pneumothorax refers to free air around the  collapsed lung(s), inside of the thoracic cavity (chest cavity).  Another two possible  complications related to a similar event would include: Hemothorax and Chylothorax.   These are variations of the Pneumothorax, where instead of air around the collapsed  lung(s), you may have blood or chyle, respectively.       6. Spinal headaches: They may occur with any procedures in the area of the spine.       7. Persistent CSF (Cerebro-Spinal Fluid) leakage: This is a rare problem, but may occur  with prolonged intrathecal or epidural catheters either due to the formation of a fistulous  track or a dural tear.       8. Nerve damage: By working so close to the spinal cord, there is always a possibility of  nerve damage, which could be as serious as a permanent spinal cord injury with  paralysis.       9. Death:  Although rare, severe deadly allergic reactions known as "Anaphylactic  reaction" can occur to any of the medications used.      10. Worsening of the symptoms:  We can always make thing worse.  What are the chances of something like this happening? Chances of any of this occuring are extremely low.  By statistics, you have more of a chance of getting killed in a motor vehicle accident: while driving to the hospital than any of the above occurring .  Nevertheless, you  should be aware that they are possibilities.  In general, it is similar to taking a shower.  Everybody knows that you can slip, hit your head and get killed.  Does that mean that you should not shower again?  Nevertheless always keep in mind that statistics do not mean anything if you happen to be on the wrong side of them.  Even if a procedure has a 1 (one) in a 1,000,000 (million) chance of going wrong, it you happen to be that one..Also, keep in mind that by statistics, you have more of a chance of having something go wrong when taking medications.  Who should not have this procedure? If you are  on a blood thinning medication (e.g. Coumadin, Plavix, see list of "Blood Thinners"), or if you have an active infection going on, you should not have the procedure.  If you are taking any blood thinners, please inform your physician.  How should I prepare for this procedure?  Do not eat or drink anything at least six hours prior to the procedure.  Bring a driver with you .  It cannot be a taxi.  Come accompanied by an adult that can drive you back, and that is strong enough to help you if your legs get weak or numb from the local anesthetic.  Take all of your medicines the morning of the procedure with just enough water to swallow them.  If you have diabetes, make sure that you are scheduled to have your procedure done first thing in the morning, whenever possible.  If you have diabetes, take only half of your insulin dose and notify our nurse that you have done so as soon as you arrive at the clinic.  If you are diabetic, but only take blood sugar pills (oral hypoglycemic), then do not take them on the morning of your procedure.  You may take them after you have had the procedure.  Do not take aspirin or any aspirin-containing medications, at least eleven (11) days prior to the procedure.  They may prolong bleeding.  Wear loose fitting clothing that may be easy to take off and that you would not  mind if it got stained with Betadine or blood.  Do not wear any jewelry or perfume  Remove any nail coloring.  It will interfere with some of our monitoring equipment.  NOTE: Remember that this is not meant to be interpreted as a complete list of all possible complications.  Unforeseen problems may occur.  BLOOD THINNERS The following drugs contain aspirin or other products, which can cause increased bleeding during surgery and should not be taken for 2 weeks prior to and 1 week after surgery.  If you should need take something for relief of minor pain, you may take acetaminophen which is found in Tylenol,m Datril, Anacin-3 and Panadol. It is not blood thinner. The products listed below are.  Do not take any of the products listed below in addition to any listed on your instruction sheet.  A.P.C or A.P.C with Codeine Codeine Phosphate Capsules #3 Ibuprofen Ridaura  ABC compound Congesprin Imuran rimadil  Advil Cope Indocin Robaxisal  Alka-Seltzer Effervescent Pain Reliever and Antacid Coricidin or Coricidin-D  Indomethacin Rufen  Alka-Seltzer plus Cold Medicine Cosprin Ketoprofen S-A-C Tablets  Anacin Analgesic Tablets or Capsules Coumadin Korlgesic Salflex  Anacin Extra Strength Analgesic tablets or capsules CP-2 Tablets Lanoril Salicylate  Anaprox Cuprimine Capsules Levenox Salocol  Anexsia-D Dalteparin Magan Salsalate  Anodynos Darvon compound Magnesium Salicylate Sine-off  Ansaid Dasin Capsules Magsal Sodium Salicylate  Anturane Depen Capsules Marnal Soma  APF Arthritis pain formula Dewitt's Pills Measurin Stanback  Argesic Dia-Gesic Meclofenamic Sulfinpyrazone  Arthritis Bayer Timed Release Aspirin Diclofenac Meclomen Sulindac  Arthritis pain formula Anacin Dicumarol Medipren Supac  Analgesic (Safety coated) Arthralgen Diffunasal Mefanamic Suprofen  Arthritis Strength Bufferin Dihydrocodeine Mepro Compound Suprol  Arthropan liquid Dopirydamole Methcarbomol with Aspirin Synalgos    ASA tablets/Enseals Disalcid Micrainin Tagament  Ascriptin Doan's Midol Talwin  Ascriptin A/D Dolene Mobidin Tanderil  Ascriptin Extra Strength Dolobid Moblgesic Ticlid  Ascriptin with Codeine Doloprin or Doloprin with Codeine Momentum Tolectin  Asperbuf Duoprin Mono-gesic Trendar  Aspergum Duradyne Motrin or Motrin IB Triminicin  Aspirin plain,  buffered or enteric coated Durasal Myochrisine Trigesic  Aspirin Suppositories Easprin Nalfon Trillsate  Aspirin with Codeine Ecotrin Regular or Extra Strength Naprosyn Uracel  Atromid-S Efficin Naproxen Ursinus  Auranofin Capsules Elmiron Neocylate Vanquish  Axotal Emagrin Norgesic Verin  Azathioprine Empirin or Empirin with Codeine Normiflo Vitamin E  Azolid Emprazil Nuprin Voltaren  Bayer Aspirin plain, buffered or children's or timed BC Tablets or powders Encaprin Orgaran Warfarin Sodium  Buff-a-Comp Enoxaparin Orudis Zorpin  Buff-a-Comp with Codeine Equegesic Os-Cal-Gesic   Buffaprin Excedrin plain, buffered or Extra Strength Oxalid   Bufferin Arthritis Strength Feldene Oxphenbutazone   Bufferin plain or Extra Strength Feldene Capsules Oxycodone with Aspirin   Bufferin with Codeine Fenoprofen Fenoprofen Pabalate or Pabalate-SF   Buffets II Flogesic Panagesic   Buffinol plain or Extra Strength Florinal or Florinal with Codeine Panwarfarin   Buf-Tabs Flurbiprofen Penicillamine   Butalbital Compound Four-way cold tablets Penicillin   Butazolidin Fragmin Pepto-Bismol   Carbenicillin Geminisyn Percodan   Carna Arthritis Reliever Geopen Persantine   Carprofen Gold's salt Persistin   Chloramphenicol Goody's Phenylbutazone   Chloromycetin Haltrain Piroxlcam   Clmetidine heparin Plaquenil   Cllnoril Hyco-pap Ponstel   Clofibrate Hydroxy chloroquine Propoxyphen         Before stopping any of these medications, be sure to consult the physician who ordered them.  Some, such as Coumadin (Warfarin) are ordered to prevent or treat serious  conditions such as "deep thrombosis", "pumonary embolisms", and other heart problems.  The amount of time that you may need off of the medication may also vary with the medication and the reason for which you were taking it.  If you are taking any of these medications, please make sure you notify your pain physician before you undergo any procedures.         Epidural Steroid Injection An epidural steroid injection is a shot of steroid medicine and numbing medicine that is given into the space between the spinal cord and the bones in your back (epidural space). The shot helps relieve pain caused by an irritated or swollen nerve root. The amount of pain relief you get from the injection depends on what is causing the nerve to be swollen and irritated, and how long your pain lasts. You are more likely to benefit from this injection if your pain is strong and comes on suddenly rather than if you have had pain for a long time. Tell a health care provider about:  Any allergies you have.  All medicines you are taking, including vitamins, herbs, eye drops, creams, and over-the-counter medicines.  Any problems you or family members have had with anesthetic medicines.  Any blood disorders you have.  Any surgeries you have had.  Any medical conditions you have.  Whether you are pregnant or may be pregnant. What are the risks? Generally, this is a safe procedure. However, problems may occur, including:  Headache.  Bleeding.  Infection.  Allergic reaction to medicines.  Damage to your nerves. What happens before the procedure? Staying hydrated  Follow instructions from your health care provider about hydration, which may include:  Up to 2 hours before the procedure - you may continue to drink clear liquids, such as water, clear fruit juice, black coffee, and plain tea. Eating and drinking restrictions  Follow instructions from your health care provider about eating and drinking, which  may include:  8 hours before the procedure - stop eating heavy meals or foods such as meat, fried foods, or fatty foods.  6 hours  before the procedure - stop eating light meals or foods, such as toast or cereal.  6 hours before the procedure - stop drinking milk or drinks that contain milk.  2 hours before the procedure - stop drinking clear liquids. Medicine  You may be given medicines to lower anxiety.  Ask your health care provider about:  Changing or stopping your regular medicines. This is especially important if you are taking diabetes medicines or blood thinners.  Taking medicines such as aspirin and ibuprofen. These medicines can thin your blood. Do not take these medicines before your procedure if your health care provider instructs you not to. General instructions  Plan to have someone take you home from the hospital or clinic. What happens during the procedure?  You may receive a medicine to help you relax (sedative).  You will be asked to lie on your abdomen.  The injection site will be cleaned.  A numbing medicine (local anesthetic) will be used to numb the injection site.  A needle will be inserted through your skin into the epidural space. You may feel some discomfort when this happens. An X-ray machine will be used to make sure the needle is put as close as possible to the affected nerve.  A steroid medicine and a local anesthetic will be injected into the epidural space.  The needle will be removed.  A bandage (dressing) will be put over the injection site. What happens after the procedure?  Your blood pressure, heart rate, breathing rate, and blood oxygen level will be monitored until the medicines you were given have worn off.  Your arm or leg may feel weak or numb for a few hours.  The injection site may feel sore.  Do not drive for 24 hours if you received a sedative. This information is not intended to replace advice given to you by your health  care provider. Make sure you discuss any questions you have with your health care provider. Document Released: 06/10/2007 Document Revised: 08/15/2015 Document Reviewed: 06/19/2015 Elsevier Interactive Patient Education  2017 [employer].

## 2016-04-09 ENCOUNTER — Ambulatory Visit (HOSPITAL_BASED_OUTPATIENT_CLINIC_OR_DEPARTMENT_OTHER): Payer: BLUE CROSS/BLUE SHIELD | Admitting: Pain Medicine

## 2016-04-09 ENCOUNTER — Ambulatory Visit
Admission: RE | Admit: 2016-04-09 | Discharge: 2016-04-09 | Disposition: A | Discharge status: Home / Self Care | Payer: BLUE CROSS/BLUE SHIELD | Source: Ambulatory Visit | Attending: Pain Medicine | Admitting: Pain Medicine

## 2016-04-09 ENCOUNTER — Encounter: Payer: Self-pay | Admitting: Pain Medicine

## 2016-04-09 VITALS — BP 135/60 | HR 73 | Temp 98.2°F | Resp 18 | Ht 65.5 in | Wt 232.0 lb

## 2016-04-09 DIAGNOSIS — M5442 Lumbago with sciatica, left side: Secondary | ICD-10-CM | POA: Insufficient documentation

## 2016-04-09 DIAGNOSIS — M79604 Pain in right leg: Secondary | ICD-10-CM

## 2016-04-09 DIAGNOSIS — M5416 Radiculopathy, lumbar region: Secondary | ICD-10-CM | POA: Insufficient documentation

## 2016-04-09 DIAGNOSIS — M5441 Lumbago with sciatica, right side: Secondary | ICD-10-CM

## 2016-04-09 DIAGNOSIS — M25561 Pain in right knee: Secondary | ICD-10-CM | POA: Insufficient documentation

## 2016-04-09 DIAGNOSIS — G8929 Other chronic pain: Secondary | ICD-10-CM | POA: Diagnosis present

## 2016-04-09 DIAGNOSIS — M5410 Radiculopathy, site unspecified: Secondary | ICD-10-CM | POA: Diagnosis not present

## 2016-04-09 MED ORDER — LACTATED RINGERS IV SOLN: 1000.0000 mL | Freq: Once | INTRAVENOUS | Status: DC

## 2016-04-09 MED ORDER — SODIUM CHLORIDE 0.9% FLUSH
2.0000 mL | Freq: Once | INTRAVENOUS | Status: AC
  Administered 2016-04-09: 2 mL

## 2016-04-09 MED ORDER — TRIAMCINOLONE ACETONIDE 40 MG/ML IJ SUSP
40.0000 mg | Freq: Once | INTRAMUSCULAR | Status: AC
  Administered 2016-04-09: 40 mg
  Filled 2016-04-09: qty 1

## 2016-04-09 MED ORDER — IOPAMIDOL (ISOVUE-M 200) INJECTION 41%
10.0000 mL | Freq: Once | INTRAMUSCULAR | Status: DC
  Filled 2016-04-09: qty 10

## 2016-04-09 MED ORDER — LIDOCAINE HCL (PF) 1 % IJ SOLN
10.0000 mL | Freq: Once | INTRAMUSCULAR | Status: AC
  Administered 2016-04-09: 10 mL
  Filled 2016-04-09: qty 10

## 2016-04-09 MED ORDER — MIDAZOLAM HCL 5 MG/5ML IJ SOLN
1.0000 mg | INTRAMUSCULAR | Status: DC | PRN
  Administered 2016-04-09: 2 mg via INTRAVENOUS
  Filled 2016-04-09: qty 5

## 2016-04-09 MED ORDER — SODIUM CHLORIDE 0.9 % IJ SOLN
INTRAMUSCULAR | Status: AC
  Filled 2016-04-09: qty 10

## 2016-04-09 MED ORDER — FENTANYL CITRATE (PF) 100 MCG/2ML IJ SOLN
25.0000 ug | INTRAMUSCULAR | Status: DC | PRN
  Administered 2016-04-09: 50 ug via INTRAVENOUS
  Filled 2016-04-09: qty 2

## 2016-04-09 MED ORDER — ROPIVACAINE HCL 2 MG/ML IJ SOLN
2.0000 mL | Freq: Once | INTRAMUSCULAR | Status: AC
  Administered 2016-04-09: 2 mL via EPIDURAL
  Filled 2016-04-09: qty 10

## 2016-04-09 NOTE — Progress Notes (Signed)
Patient's Name: Molly Wall  MRN: [id number]  Referring Provider: Milinda Pointer, MD  DOB: [birth year]  PCP: [name], NP  DOS: 04/09/2016  Note by: Kathlen Brunswick. Dossie Arbour, MD  Service setting: Ambulatory outpatient  Location: [facility] (AMB) Pain Management Facility  Visit type: Procedure  Specialty: Interventional Pain Management  Patient type: Established   Primary Reason for Visit: Interventional Pain Management Treatment. CC: Back Pain (lower, bilateral); Knee Pain (right ); and Foot Pain (left heel)  Procedure:  Anesthesia, Analgesia, Anxiolysis:  Type: Therapeutic Inter-Laminar Epidural Steroid Injection Region: Lumbar Level: L4-5 Level. Laterality: Right-Sided Paramedial  Type: Local Anesthesia with Moderate (Conscious) Sedation Local Anesthetic: Lidocaine 1% Route: Intravenous (IV) IV Access: Secured Sedation: Meaningful verbal contact was maintained at all times during the procedure  Indication(s): Analgesia and Anxiety  Indications: 1. Chronic lumbar radicular pain (Right)   2. Chronic lower extremity pain (Location of Primary Source of Pain) (Right)   3. Chronic low back pain (Location of Secondary source of pain) (Bilateral) (R>L)   4. Chronic bilateral low back pain with left-sided sciatica    Pain Score: Pre-procedure: 1 /10 Post-procedure: 0-No pain/10  Pre-op Assessment:  Previous date of service: 03/26/16 Service provided: Evaluation Molly Wall is a 71 y.o. (year old), female patient, seen today for interventional treatment. She  has a past surgical history that includes Shoulder surgery (1966); Knee surgery (2014); Appendectomy; and Ganglion cyst excision. Her primarily concern today is the Back Pain (lower, bilateral); Knee Pain (right ); and Foot Pain (left heel)  Initial Vital Signs: Blood pressure 138/79, pulse 71, temperature 98.2 F (36.8 C), temperature source Oral, resp. rate 16, height 5' 5.5" (1.664 m), weight 232 lb (105.2 kg), SpO2 98 %. BMI:  38.02 kg/m  Risk Assessment: Allergies: Reviewed. She is allergic to iodine. Allergy Precautions: None required Coagulopathies: "Reviewed. None identified.  Blood-thinner therapy: None at this time Active Infection(s): Reviewed. None identified. Molly Wall is afebrile  Site Confirmation: Molly Wall was asked to confirm the procedure and laterality before marking the site Procedure checklist: Completed Consent: Before the procedure and under the influence of no sedative(s), amnesic(s), or anxiolytics, the patient was informed of the treatment options, risks and possible complications. To fulfill our ethical and legal obligations, as recommended by the American Medical Association's Code of Ethics, I have informed the patient of my clinical impression; the nature and purpose of the treatment or procedure; the risks, benefits, and possible complications of the intervention; the alternatives, including doing nothing; the risk(s) and benefit(s) of the alternative treatment(s) or procedure(s); and the risk(s) and benefit(s) of doing nothing. The patient was provided information about the general risks and possible complications associated with the procedure. These may include, but are not limited to: failure to achieve desired goals, infection, bleeding, organ or nerve damage, allergic reactions, paralysis, and death. In addition, the patient was informed of those risks and complications associated to Spine-related procedures, such as failure to decrease pain; infection (i.e.: Meningitis, epidural or intraspinal abscess); bleeding (i.e.: epidural hematoma, subarachnoid hemorrhage, or any other type of intraspinal or peri-dural bleeding); organ or nerve damage (i.e.: Any type of peripheral nerve, nerve root, or spinal cord injury) with subsequent damage to sensory, motor, and/or autonomic systems, resulting in permanent pain, numbness, and/or weakness of one or several areas of the body; allergic reactions;  (i.e.: anaphylactic reaction); and/or death. Furthermore, the patient was informed of those risks and complications associated with the medications. These include, but are not limited to: allergic reactions (  i.e.: anaphylactic or anaphylactoid reaction(s)); adrenal axis suppression; blood sugar elevation that in diabetics may result in ketoacidosis or comma; water retention that in patients with history of congestive heart failure may result in shortness of breath, pulmonary edema, and decompensation with resultant heart failure; weight gain; swelling or edema; medication-induced neural toxicity; particulate matter embolism and blood vessel occlusion with resultant organ, and/or nervous system infarction; and/or aseptic necrosis of one or more joints. Finally, the patient was informed that Medicine is not an exact science; therefore, there is also the possibility of unforeseen or unpredictable risks and/or possible complications that may result in a catastrophic outcome. The patient indicated having understood very clearly. We have given the patient no guarantees and we have made no promises. Enough time was given to the patient to ask questions, all of which were answered to the patient's satisfaction. Molly Wall has indicated that she wanted to continue with the procedure. Attestation: I, the ordering provider, attest that I have discussed with the patient the benefits, risks, side-effects, alternatives, likelihood of achieving goals, and potential problems during recovery for the procedure that I have provided informed consent. Date: 04/09/2016; Time: 9:13 AM  Pre-Procedure Preparation:  Monitoring: As per clinic protocol. Respiration, ETCO2, SpO2, BP, heart rate and rhythm monitor placed and checked for adequate function Safety Precautions: Patient was assessed for positional comfort and pressure points before starting the procedure. Time-out: I initiated and conducted the "Time-out" before starting the  procedure, as per protocol. The patient was asked to participate by confirming the accuracy of the "Time Out" information. Verification of the correct person, site, and procedure were performed and confirmed by me, the nursing staff, and the patient. "Time-out" conducted as per Joint Commission's Universal Protocol (UP.01.01.01). "Time-out" Date & Time: 04/09/2016; 0947 hrs.  Description of Procedure Process:   Position: Prone with head of the table was raised to facilitate breathing. Target Area: The interlaminar space, initially targeting the lower laminar border of the superior vertebral body. Approach: Paramedial approach. Area Prepped: Entire Posterior Lumbar Region Prepping solution: ChloraPrep (2% chlorhexidine gluconate and 70% isopropyl alcohol) Safety Precautions: Aspiration looking for blood return was conducted prior to all injections. At no point did we inject any substances, as a needle was being advanced. No attempts were made at seeking any paresthesias. Safe injection practices and needle disposal techniques used. Medications properly checked for expiration dates. SDV (single dose vial) medications used. Description of the Procedure: Protocol guidelines were followed. The procedure needle was introduced through the skin, ipsilateral to the reported pain, and advanced to the target area. Bone was contacted and the needle walked caudad, until the lamina was cleared. The epidural space was identified using "loss-of-resistance technique" with 2-3 ml of PF-NaCl (0.9% NSS), in a 5cc LOR glass syringe. Start Time: 0947 hrs. End Time: 1001 hrs. Materials:  Needle(s) Type: Epidural needle Gauge: 22G Length: 3.5-in Medication(s): We administered fentaNYL, midazolam, triamcinolone acetonide, lidocaine (PF), sodium chloride flush, and ropivacaine (PF) 2 mg/mL (0.2%). Please see chart orders for dosing details.  Imaging Guidance (Spinal):  Type of Imaging Technique: Fluoroscopy Guidance  (Spinal) Indication(s): Assistance in needle guidance and placement for procedures requiring needle placement in or near specific anatomical locations not easily accessible without such assistance. Exposure Time: Please see nurses notes. Contrast: Before injecting any contrast, we confirmed that the patient did not have an allergy to iodine, shellfish, or radiological contrast. Once satisfactory needle placement was completed at the desired level, radiological contrast was injected. Contrast injected under live  fluoroscopy. No contrast complications. See chart for type and volume of contrast used. Fluoroscopic Guidance: I was personally present during the use of fluoroscopy. "Tunnel Vision Technique" used to obtain the best possible view of the target area. Parallax error corrected before commencing the procedure. "Direction-depth-direction" technique used to introduce the needle under continuous pulsed fluoroscopy. Once target was reached, antero-posterior, oblique, and lateral fluoroscopic projection used confirm needle placement in all planes. Images permanently stored in EMR. Interpretation: I personally interpreted the imaging intraoperatively. Adequate needle placement confirmed in multiple planes. Appropriate spread of contrast into desired area was observed. No evidence of afferent or efferent intravascular uptake. No intrathecal or subarachnoid spread observed. Permanent images saved into the patient's record.  Antibiotic Prophylaxis:  Indication(s): None identified Antibiotic given: None  Post-operative Assessment:  EBL: None Complications: No immediate post-treatment complications observed by team, or reported by patient. Note: The patient tolerated the entire procedure well. A repeat set of vitals were taken after the procedure and the patient was kept under observation following institutional policy, for this type of procedure. Post-procedural neurological assessment was performed, showing  return to baseline, prior to discharge. The patient was provided with post-procedure discharge instructions, including a section on how to identify potential problems. Should any problems arise concerning this procedure, the patient was given instructions to immediately contact us, at any time, without hesitation. In any case, we plan to contact the patient by telephone for a follow-up status report regarding this interventional procedure. Comments:  No additional relevant information.  Plan of Care  Disposition: Discharge home  Discharge Date & Time: 04/09/2016; 1045 hrs.  Physician-requested Follow-up:  Return in about 2 weeks (around 04/23/2016) for Post-Procedure evaluation.  Future Appointments Date Time Provider [place]  04/10/2016 10:00 AM [facility]  05/13/2016 2:00 PM Milinda Pointer, MD [facility] None   Medications ordered for procedure: Meds ordered this encounter  Medications  . fentaNYL (SUBLIMAZE) injection 25-50 mcg    Make sure Narcan is available in the pyxis when using this medication. In the event of respiratory depression (RR< 8/min): Titrate NARCAN (naloxone) in increments of 0.1 to 0.2 mg IV at 2-3 minute intervals, until desired degree of reversal.  . lactated ringers infusion 1,000 mL  . midazolam (VERSED) 5 MG/5ML injection 1-2 mg    Make sure Flumazenil is available in the pyxis when using this medication. If oversedation occurs, administer 0.2 mg IV over 15 sec. If after 45 sec no response, administer 0.2 mg again over 1 min; may repeat at 1 min intervals; not to exceed 4 doses (1 mg)  . iopamidol (ISOVUE-M) 41 % intrathecal injection 10 mL  . triamcinolone acetonide (KENALOG-40) injection 40 mg  . lidocaine (PF) (XYLOCAINE) 1 % injection 10 mL  . sodium chloride flush (NS) 0.9 % injection 2 mL  . ropivacaine (PF) 2 mg/mL (0.2%) (NAROPIN) injection 2 mL  . sodium chloride 0.9 % injection    Fl[place]ne [place], Patti: cabinet override    Medications administered: We administered fentaNYL, midazolam, triamcinolone acetonide, lidocaine (PF), sodium chloride flush, and ropivacaine (PF) 2 mg/mL (0.2%).  See the medical record for exact dosing, route, and time of administration.  Lab-work, Procedure(s), & Referral(s) Ordered: Orders Placed This Encounter  Procedures  . Lumbar Epidural Injection  . DG C-Arm 1-60 Min-No Report  . Discharge instructions  . Follow-up  . Informed Consent Details: Transcribe to consent form and obtain patient signature  . Provider attestation of informed consent for procedure/surgical case  . Verify  informed consent  . Informed Consent Details: Transcribe to consent form and obtain patient signature   Imaging Ordered: Results for orders placed in visit on 02/13/16  DG C-Arm 1-60 Min-No Report   Narrative Fluoroscopy was utilized by the requesting physician.  No radiographic  interpretation.    New Prescriptions   No medications on file   Primary Care Physician: [name], NP Location: [facility] Note by: Kathlen Brunswick. Dossie Arbour, M.D, DABA, DABAPM, DABPM, DABIPP, FIPP Date: 04/09/2016; Time: 1:05 PM  Disclaimer:  Medicine is not an Chief Strategy Officer. The only guarantee in medicine is that nothing is guaranteed. It is important to note that the decision to proceed with this intervention was based on the information collected from the patient. The Data and conclusions were drawn from the patient's questionnaire, the interview, and the physical examination. Because the information was provided in large part by the patient, it cannot be guaranteed that it has not been purposely or unconsciously manipulated. Every effort has been made to obtain as much relevant data as possible for this evaluation. It is important to note that the conclusions that lead to this procedure are derived in large part from the available data. Always take into account that the treatment will  also be dependent on availability of resources and existing treatment guidelines, considered by other Pain Management Practitioners as being common knowledge and practice, at the time of the intervention. For Medico-Legal purposes, it is also important to point out that variation in procedural techniques and pharmacological choices are the acceptable norm. The indications, contraindications, technique, and results of the above procedure should only be interpreted and judged by a Board-Certified Interventional Pain Specialist with extensive familiarity and expertise in the same exact procedure and technique. Attempts at providing opinions without similar or greater experience and expertise than that of the treating physician will be considered as inappropriate and unethical, and shall result in a formal complaint to the state medical board and applicable specialty societies.  Instructions provided at this appointment: Patient Instructions  Pain Management Discharge Instructions  General Discharge Instructions :  If you need to reach your doctor call: Monday-Friday 8:00 am - 4:00 pm at [phone] or toll free [phone].  After clinic hours [phone] to have operator reach doctor.  Bring all of your medication bottles to all your appointments in the pain clinic.  To cancel or reschedule your appointment with Pain Management please remember to call 24 hours in advance to avoid a fee.  Refer to the educational materials which you have been given on: General Risks, I had my Procedure. Discharge Instructions, Post Sedation.  Post Procedure Instructions:  The drugs you were given will stay in your system until tomorrow, so for the next 24 hours you should not drive, make any legal decisions or drink any alcoholic beverages.  You may eat anything you prefer, but it is better to start with liquids then soups and crackers, and gradually work up to solid foods.  Please notify your doctor  immediately if you have any unusual bleeding, trouble breathing or pain that is not related to your normal pain.  Depending on the type of procedure that was done, some parts of your body may feel week and/or numb.  This usually clears up by tonight or the next day.  Walk with the use of an assistive device or accompanied by an adult for the 24 hours.  You may use ice on the affected area for the first 24  hours.  Put ice in a Ziploc bag and cover with a towel and place against area 15 minutes on 15 minutes off.  You may switch to heat after 24 hours.  Please complete your Post Procedure Diary

## 2016-04-09 NOTE — Patient Instructions (Addendum)
Pain Management Discharge Instructions  General Discharge Instructions :  If you need to reach your doctor call: Monday-Friday 8:00 am - 4:00 pm at [phone] or toll free [phone].  After clinic hours [phone] to have operator reach doctor.  Bring all of your medication bottles to all your appointments in the pain clinic.  To cancel or reschedule your appointment with Pain Management please remember to call 24 hours in advance to avoid a fee.  Refer to the educational materials which you have been given on: General Risks, I had my Procedure. Discharge Instructions, Post Sedation.  Post Procedure Instructions:  The drugs you were given will stay in your system until tomorrow, so for the next 24 hours you should not drive, make any legal decisions or drink any alcoholic beverages.  You may eat anything you prefer, but it is better to start with liquids then soups and crackers, and gradually work up to solid foods.  Please notify your doctor immediately if you have any unusual bleeding, trouble breathing or pain that is not related to your normal pain.  Depending on the type of procedure that was done, some parts of your body may feel week and/or numb.  This usually clears up by tonight or the next day.  Walk with the use of an assistive device or accompanied by an adult for the 24 hours.  You may use ice on the affected area for the first 24 hours.  Put ice in a Ziploc bag and cover with a towel and place against area 15 minutes on 15 minutes off.  You may switch to heat after 24 hours.  Please complete your Post Procedure Diary

## 2016-04-09 NOTE — Progress Notes (Signed)
Safety precautions to be maintained throughout the outpatient stay will include: orient to surroundings, keep bed in low position, maintain call bell within reach at all times, provide assistance with transfer out of bed and ambulation.  

## 2016-04-10 ENCOUNTER — Other Ambulatory Visit (INDEPENDENT_AMBULATORY_CARE_PROVIDER_SITE_OTHER): Payer: BLUE CROSS/BLUE SHIELD

## 2016-04-10 ENCOUNTER — Other Ambulatory Visit: Payer: BLUE CROSS/BLUE SHIELD

## 2016-04-10 ENCOUNTER — Telehealth: Payer: Self-pay | Admitting: *Deleted

## 2016-04-10 DIAGNOSIS — E119 Type 2 diabetes mellitus without complications: Secondary | ICD-10-CM | POA: Diagnosis not present

## 2016-04-10 LAB — MICROALBUMIN / CREATININE URINE RATIO
Creatinine,U: 36.7 mg/dL
Microalb Creat Ratio: 1.9 mg/g (ref 0.0–30.0)
Microalb, Ur: <0.7 mg/dL (ref 0.0–1.9)

## 2016-04-10 LAB — HEMOGLOBIN A1C: Hgb A1c MFr Bld: 6.8 % — ABNORMAL HIGH (ref 4.6–6.5)

## 2016-04-10 NOTE — Telephone Encounter (Signed)
Spoke with patient re; procedure on yesterday.  Verbalizes no questions or concerns.  

## 2016-04-10 NOTE — Telephone Encounter (Signed)
Spoke with patient re; procedure on yesterday, verbalizes no questions or concerns.  

## 2016-04-14 ENCOUNTER — Other Ambulatory Visit: Payer: Self-pay | Admitting: Primary Care

## 2016-04-14 DIAGNOSIS — J302 Other seasonal allergic rhinitis: Secondary | ICD-10-CM

## 2016-04-14 DIAGNOSIS — J45909 Unspecified asthma, uncomplicated: Secondary | ICD-10-CM

## 2016-04-14 DIAGNOSIS — E119 Type 2 diabetes mellitus without complications: Secondary | ICD-10-CM

## 2016-04-14 MED ORDER — METFORMIN HCL 500 MG PO TABS
ORAL_TABLET | ORAL | 0 refills | Status: DC
Start: 2016-04-14 — End: 2016-06-10

## 2016-04-16 ENCOUNTER — Telehealth: Payer: Self-pay | Admitting: Primary Care

## 2016-04-16 ENCOUNTER — Telehealth: Payer: Self-pay

## 2016-04-16 NOTE — Telephone Encounter (Signed)
Why was she taking folic acid? Does she have a deficiency of folic acid or anemia? Both blood tests we've done since May 2017 did not show anemia.

## 2016-04-16 NOTE — Telephone Encounter (Signed)
Opened in error

## 2016-04-16 NOTE — Telephone Encounter (Signed)
Pt request refill folic acid to [facility]. Last filled # 10 on 01/11/16. Pt last seen 01/09/16 and pt said she has not been taking the folic acid because she did not have it. Only time I see refilled by [facility] was # 10 on 01/11/16. Do you want pt to continue folic acid.Please advise.

## 2016-04-18 MED ORDER — FLUTICASONE PROPIONATE 50 MCG/ACT NA SUSP: 2.0000 | Freq: Every day | NASAL | 1 refills | Status: DC

## 2016-04-18 MED ORDER — ALBUTEROL SULFATE HFA 108 (90 BASE) MCG/ACT IN AERS: 2.0000 | INHALATION_SPRAY | Freq: Four times a day (QID) | RESPIRATORY_TRACT | 1 refills | Status: DC | PRN

## 2016-04-18 NOTE — Telephone Encounter (Signed)
Spoken to patient earlier today. She stated that she was told by a previous provider to take folic acid to help digest the medications she is currently taking. Patient stated that if Molly Wall does not think patient needs it then we do not have to refill.

## 2016-04-20 NOTE — Telephone Encounter (Signed)
She can get folic acid through a daily multi vitamin, I don't think she has to take a prescribed dose of this.

## 2016-04-21 NOTE — Telephone Encounter (Signed)
Message left for patient to return my call.  

## 2016-04-22 NOTE — Telephone Encounter (Signed)
Patient returned Chan's call.  Please call patient back at [phone].

## 2016-04-23 NOTE — Telephone Encounter (Signed)
Spoken and notified patient of Kate's comments. Patient verbalized understanding. 

## 2016-05-08 ENCOUNTER — Telehealth: Payer: Self-pay | Admitting: *Deleted

## 2016-05-13 ENCOUNTER — Ambulatory Visit: Payer: BLUE CROSS/BLUE SHIELD | Admitting: Pain Medicine

## 2016-06-10 ENCOUNTER — Other Ambulatory Visit: Payer: Self-pay | Admitting: *Deleted

## 2016-06-10 DIAGNOSIS — E119 Type 2 diabetes mellitus without complications: Secondary | ICD-10-CM

## 2016-06-10 MED ORDER — METFORMIN HCL 500 MG PO TABS: ORAL_TABLET | ORAL | 0 refills | Status: DC

## 2016-06-12 ENCOUNTER — Ambulatory Visit: Payer: BLUE CROSS/BLUE SHIELD | Attending: Pain Medicine | Admitting: Pain Medicine

## 2016-06-12 ENCOUNTER — Encounter: Payer: Self-pay | Admitting: Pain Medicine

## 2016-06-12 VITALS — BP 113/52 | HR 99 | Temp 98.0°F | Resp 16 | Ht 65.5 in | Wt 232.0 lb

## 2016-06-12 DIAGNOSIS — Z87891 Personal history of nicotine dependence: Secondary | ICD-10-CM | POA: Diagnosis not present

## 2016-06-12 DIAGNOSIS — M545 Low back pain: Secondary | ICD-10-CM | POA: Diagnosis not present

## 2016-06-12 DIAGNOSIS — G8929 Other chronic pain: Secondary | ICD-10-CM | POA: Insufficient documentation

## 2016-06-12 DIAGNOSIS — E785 Hyperlipidemia, unspecified: Secondary | ICD-10-CM | POA: Insufficient documentation

## 2016-06-12 DIAGNOSIS — M5410 Radiculopathy, site unspecified: Secondary | ICD-10-CM

## 2016-06-12 DIAGNOSIS — Z7984 Long term (current) use of oral hypoglycemic drugs: Secondary | ICD-10-CM | POA: Diagnosis not present

## 2016-06-12 DIAGNOSIS — M5441 Lumbago with sciatica, right side: Secondary | ICD-10-CM

## 2016-06-12 DIAGNOSIS — M5416 Radiculopathy, lumbar region: Secondary | ICD-10-CM | POA: Insufficient documentation

## 2016-06-12 DIAGNOSIS — M79604 Pain in right leg: Secondary | ICD-10-CM | POA: Diagnosis not present

## 2016-06-12 DIAGNOSIS — Z79899 Other long term (current) drug therapy: Secondary | ICD-10-CM | POA: Diagnosis not present

## 2016-06-12 NOTE — Patient Instructions (Signed)
GENERAL RISKS AND COMPLICATIONS  What are the risk, side effects and possible complications? Generally speaking, most procedures are safe.  However, with any procedure there are risks, side effects, and the possibility of complications.  The risks and complications are dependent upon the sites that are lesioned, or the type of nerve block to be performed.  The closer the procedure is to the spine, the more serious the risks are.  Great care is taken when placing the radio frequency needles, block needles or lesioning probes, but sometimes complications can occur. 1. Infection: Any time there is an injection through the skin, there is a risk of infection.  This is why sterile conditions are used for these blocks.  There are four possible types of infection. 1. Localized skin infection. 2. Central Nervous System Infection-This can be in the form of Meningitis, which can be deadly. 3. Epidural Infections-This can be in the form of an epidural abscess, which can cause pressure inside of the spine, causing compression of the spinal cord with subsequent paralysis. This would require an emergency surgery to decompress, and there are no guarantees that the patient would recover from the paralysis. 4. Discitis-This is an infection of the intervertebral discs.  It occurs in about 1% of discography procedures.  It is difficult to treat and it may lead to surgery.        2. Pain: the needles have to go through skin and soft tissues, will cause soreness.       3. Damage to internal structures:  The nerves to be lesioned may be near blood vessels or    other nerves which can be potentially damaged.       4. Bleeding: Bleeding is more common if the patient is taking blood thinners such as  aspirin, Coumadin, Ticiid, Plavix, etc., or if he/she have some genetic predisposition  such as hemophilia. Bleeding into the spinal canal can cause compression of the spinal  cord with subsequent paralysis.  This would require an  emergency surgery to  decompress and there are no guarantees that the patient would recover from the  paralysis.       5. Pneumothorax:  Puncturing of a lung is a possibility, every time a needle is introduced in  the area of the chest or upper back.  Pneumothorax refers to free air around the  collapsed lung(s), inside of the thoracic cavity (chest cavity).  Another two possible  complications related to a similar event would include: Hemothorax and Chylothorax.   These are variations of the Pneumothorax, where instead of air around the collapsed  lung(s), you may have blood or chyle, respectively.       6. Spinal headaches: They may occur with any procedures in the area of the spine.       7. Persistent CSF (Cerebro-Spinal Fluid) leakage: This is a rare problem, but may occur  with prolonged intrathecal or epidural catheters either due to the formation of a fistulous  track or a dural tear.       8. Nerve damage: By working so close to the spinal cord, there is always a possibility of  nerve damage, which could be as serious as a permanent spinal cord injury with  paralysis.       9. Death:  Although rare, severe deadly allergic reactions known as "Anaphylactic  reaction" can occur to any of the medications used.      10. Worsening of the symptoms:  We can always make thing worse.    What are the chances of something like this happening? Chances of any of this occuring are extremely low.  By statistics, you have more of a chance of getting killed in a motor vehicle accident: while driving to the hospital than any of the above occurring .  Nevertheless, you should be aware that they are possibilities.  In general, it is similar to taking a shower.  Everybody knows that you can slip, hit your head and get killed.  Does that mean that you should not shower again?  Nevertheless always keep in mind that statistics do not mean anything if you happen to be on the wrong side of them.  Even if a procedure has a 1  (one) in a 1,000,000 (million) chance of going wrong, it you happen to be that one..Also, keep in mind that by statistics, you have more of a chance of having something go wrong when taking medications.  Who should not have this procedure? If you are on a blood thinning medication (e.g. Coumadin, Plavix, see list of "Blood Thinners"), or if you have an active infection going on, you should not have the procedure.  If you are taking any blood thinners, please inform your physician.  How should I prepare for this procedure?  Do not eat or drink anything at least six hours prior to the procedure.  Bring a driver with you .  It cannot be a taxi.  Come accompanied by an adult that can drive you back, and that is strong enough to help you if your legs get weak or numb from the local anesthetic.  Take all of your medicines the morning of the procedure with just enough water to swallow them.  If you have diabetes, make sure that you are scheduled to have your procedure done first thing in the morning, whenever possible.  If you have diabetes, take only half of your insulin dose and notify our nurse that you have done so as soon as you arrive at the clinic.  If you are diabetic, but only take blood sugar pills (oral hypoglycemic), then do not take them on the morning of your procedure.  You may take them after you have had the procedure.  Do not take aspirin or any aspirin-containing medications, at least eleven (11) days prior to the procedure.  They may prolong bleeding.  Wear loose fitting clothing that may be easy to take off and that you would not mind if it got stained with Betadine or blood.  Do not wear any jewelry or perfume  Remove any nail coloring.  It will interfere with some of our monitoring equipment.  NOTE: Remember that this is not meant to be interpreted as a complete list of all possible complications.  Unforeseen problems may occur.  BLOOD THINNERS The following drugs  contain aspirin or other products, which can cause increased bleeding during surgery and should not be taken for 2 weeks prior to and 1 week after surgery.  If you should need take something for relief of minor pain, you may take acetaminophen which is found in Tylenol,m Datril, Anacin-3 and Panadol. It is not blood thinner. The products listed below are.  Do not take any of the products listed below in addition to any listed on your instruction sheet.  A.P.C or A.P.C with Codeine Codeine Phosphate Capsules #3 Ibuprofen Ridaura  ABC compound Congesprin Imuran rimadil  Advil Cope Indocin Robaxisal  Alka-Seltzer Effervescent Pain Reliever and Antacid Coricidin or Coricidin-D  Indomethacin Rufen    Alka-Seltzer plus Cold Medicine Cosprin Ketoprofen S-A-C Tablets  Anacin Analgesic Tablets or Capsules Coumadin Korlgesic Salflex  Anacin Extra Strength Analgesic tablets or capsules CP-2 Tablets Lanoril Salicylate  Anaprox Cuprimine Capsules Levenox Salocol  Anexsia-D Dalteparin Magan Salsalate  Anodynos Darvon compound Magnesium Salicylate Sine-off  Ansaid Dasin Capsules Magsal Sodium Salicylate  Anturane Depen Capsules Marnal Soma  APF Arthritis pain formula Dewitt's Pills Measurin Stanback  Argesic Dia-Gesic Meclofenamic Sulfinpyrazone  Arthritis Bayer Timed Release Aspirin Diclofenac Meclomen Sulindac  Arthritis pain formula Anacin Dicumarol Medipren Supac  Analgesic (Safety coated) Arthralgen Diffunasal Mefanamic Suprofen  Arthritis Strength Bufferin Dihydrocodeine Mepro Compound Suprol  Arthropan liquid Dopirydamole Methcarbomol with Aspirin Synalgos  ASA tablets/Enseals Disalcid Micrainin Tagament  Ascriptin Doan's Midol Talwin  Ascriptin A/D Dolene Mobidin Tanderil  Ascriptin Extra Strength Dolobid Moblgesic Ticlid  Ascriptin with Codeine Doloprin or Doloprin with Codeine Momentum Tolectin  Asperbuf Duoprin Mono-gesic Trendar  Aspergum Duradyne Motrin or Motrin IB Triminicin  Aspirin  plain, buffered or enteric coated Durasal Myochrisine Trigesic  Aspirin Suppositories Easprin Nalfon Trillsate  Aspirin with Codeine Ecotrin Regular or Extra Strength Naprosyn Uracel  Atromid-S Efficin Naproxen Ursinus  Auranofin Capsules Elmiron Neocylate Vanquish  Axotal Emagrin Norgesic Verin  Azathioprine Empirin or Empirin with Codeine Normiflo Vitamin E  Azolid Emprazil Nuprin Voltaren  Bayer Aspirin plain, buffered or children's or timed BC Tablets or powders Encaprin Orgaran Warfarin Sodium  Buff-a-Comp Enoxaparin Orudis Zorpin  Buff-a-Comp with Codeine Equegesic Os-Cal-Gesic   Buffaprin Excedrin plain, buffered or Extra Strength Oxalid   Bufferin Arthritis Strength Feldene Oxphenbutazone   Bufferin plain or Extra Strength Feldene Capsules Oxycodone with Aspirin   Bufferin with Codeine Fenoprofen Fenoprofen Pabalate or Pabalate-SF   Buffets II Flogesic Panagesic   Buffinol plain or Extra Strength Florinal or Florinal with Codeine Panwarfarin   Buf-Tabs Flurbiprofen Penicillamine   Butalbital Compound Four-way cold tablets Penicillin   Butazolidin Fragmin Pepto-Bismol   Carbenicillin Geminisyn Percodan   Carna Arthritis Reliever Geopen Persantine   Carprofen Gold's salt Persistin   Chloramphenicol Goody's Phenylbutazone   Chloromycetin Haltrain Piroxlcam   Clmetidine heparin Plaquenil   Cllnoril Hyco-pap Ponstel   Clofibrate Hydroxy chloroquine Propoxyphen         Before stopping any of these medications, be sure to consult the physician who ordered them.  Some, such as Coumadin (Warfarin) are ordered to prevent or treat serious conditions such as "deep thrombosis", "pumonary embolisms", and other heart problems.  The amount of time that you may need off of the medication may also vary with the medication and the reason for which you were taking it.  If you are taking any of these medications, please make sure you notify your pain physician before you undergo any  procedures.         Epidural Steroid Injection An epidural steroid injection is a shot of steroid medicine and numbing medicine that is given into the space between the spinal cord and the bones in your back (epidural space). The shot helps relieve pain caused by an irritated or swollen nerve root. The amount of pain relief you get from the injection depends on what is causing the nerve to be swollen and irritated, and how long your pain lasts. You are more likely to benefit from this injection if your pain is strong and comes on suddenly rather than if you have had pain for a long time. Tell a health care provider about:  Any allergies you have.  All medicines you are taking, including vitamins, herbs,   eye drops, creams, and over-the-counter medicines.  Any problems you or family members have had with anesthetic medicines.  Any blood disorders you have.  Any surgeries you have had.  Any medical conditions you have.  Whether you are pregnant or may be pregnant. What are the risks? Generally, this is a safe procedure. However, problems may occur, including:  Headache.  Bleeding.  Infection.  Allergic reaction to medicines.  Damage to your nerves.  What happens before the procedure? Staying hydrated Follow instructions from your health care provider about hydration, which may include:  Up to 2 hours before the procedure - you may continue to drink clear liquids, such as water, clear fruit juice, black coffee, and plain tea.  Eating and drinking restrictions Follow instructions from your health care provider about eating and drinking, which may include:  8 hours before the procedure - stop eating heavy meals or foods such as meat, fried foods, or fatty foods.  6 hours before the procedure - stop eating light meals or foods, such as toast or cereal.  6 hours before the procedure - stop drinking milk or drinks that contain milk.  2 hours before the procedure - stop  drinking clear liquids.  Medicine  You may be given medicines to lower anxiety.  Ask your health care provider about: ? Changing or stopping your regular medicines. This is especially important if you are taking diabetes medicines or blood thinners. ? Taking medicines such as aspirin and ibuprofen. These medicines can thin your blood. Do not take these medicines before your procedure if your health care provider instructs you not to. General instructions  Plan to have someone take you home from the hospital or clinic. What happens during the procedure?  You may receive a medicine to help you relax (sedative).  You will be asked to lie on your abdomen.  The injection site will be cleaned.  A numbing medicine (local anesthetic) will be used to numb the injection site.  A needle will be inserted through your skin into the epidural space. You may feel some discomfort when this happens. An X-ray machine will be used to make sure the needle is put as close as possible to the affected nerve.  A steroid medicine and a local anesthetic will be injected into the epidural space.  The needle will be removed.  A bandage (dressing) will be put over the injection site. What happens after the procedure?  Your blood pressure, heart rate, breathing rate, and blood oxygen level will be monitored until the medicines you were given have worn off.  Your arm or leg may feel weak or numb for a few hours.  The injection site may feel sore.  Do not drive for 24 hours if you received a sedative. This information is not intended to replace advice given to you by your health care provider. Make sure you discuss any questions you have with your health care provider. Document Released: 06/10/2007 Document Revised: 08/15/2015 Document Reviewed: 06/19/2015 [employer] Interactive Patient Education  2017 [employer]  

## 2016-06-12 NOTE — Progress Notes (Signed)
Safety precautions to be maintained throughout the outpatient stay will include: orient to surroundings, keep bed in low position, maintain call bell within reach at all times, provide assistance with transfer out of bed and ambulation.  

## 2016-06-12 NOTE — Progress Notes (Signed)
Patient's Name: Molly Wall  MRN: [id number]  Referring Provider: [name], NP  DOB: 1945-08-18  PCP: [name], NP  DOS: 06/12/2016  Note by: Kathlen Brunswick. Molly Arbour, MD  Service setting: Ambulatory outpatient  Specialty: Interventional Pain Management  Location: [facility] (AMB) Pain Management Facility    Patient type: Established   Primary Reason(s) for Visit: Encounter for post-procedure evaluation of chronic illness with mild to moderate exacerbation CC: Back Pain (lower)  HPI  Molly Wall is a 71 y.o. year old, female patient, who comes today for a post-procedure evaluation. She has Hyperlipidemia; Asthma, chronic; Hypothyroidism; Lumbar herniated disc; Borderline diabetes; Preventative health care; Vitamin D deficiency; Chronic low back pain (Location of Secondary source of pain) (Bilateral) (R>L); Chronic lower extremity pain (Location of Primary Source of Pain) (Right); and Chronic lumbar radicular pain (Right) on her problem list. Her primarily concern today is the Back Pain (lower)  Pain Assessment: Self-Reported Pain Score: 0-No pain/10             Reported level is compatible with observation.       Pain Type: Chronic pain Pain Location: Back Pain Orientation: Lower, Right, Left (primarily left ) Pain Descriptors / Indicators:  (no pain today) Pain Frequency:  (no pain)  Molly Wall comes in today for post-procedure evaluation after the treatment done on 04/09/2016.  Further details on both, my assessment(s), as well as the proposed treatment plan, please see below.  Post-Procedure Assessment  04/09/2016 Procedure: Palliative right L4-5 lumbar epidural steroid injection under fluoroscopic guidance and IV sedation Pre-procedure pain score:  1/10 Post-procedure pain score: 0/10 (100% relief) Influential Factors: BMI: 38.02 kg/m Intra-procedural challenges: None observed Assessment challenges: None detected         Post-procedural side-effects, adverse reactions, or  complications: None reported Reported issues: None  Sedation: Sedation provided. When no sedatives are used, the analgesic levels obtained are directly associated to the effectiveness of the local anesthetics. However, when sedation is provided, the level of analgesia obtained during the initial 1 hour following the intervention, is believed to be the result of a combination of factors. These factors may include, but are not limited to: 1. The effectiveness of the local anesthetics used. 2. The effects of the analgesic(s) and/or anxiolytic(s) used. 3. The degree of discomfort experienced by the patient at the time of the procedure. 4. The patients ability and reliability in recalling and recording the events. 5. The presence and influence of possible secondary gains and/or psychosocial factors. Reported result: Relief experienced during the 1st hour after the procedure: 100 % (Ultra-Short Term Relief) Interpretative annotation: Analgesia during this period is likely to be Local Anesthetic and/or IV Sedative (Analgesic/Anxiolitic) related.          Effects of local anesthetic: The analgesic effects attained during this period are directly associated to the localized infiltration of local anesthetics and therefore cary significant diagnostic value as to the etiological location, or anatomical origin, of the pain. Expected duration of relief is directly dependent on the pharmacodynamics of the local anesthetic used. Long-acting (4-6 hours) anesthetics used.  Reported result: Relief during the next 4 to 6 hour after the procedure: 100 % (Short-Term Relief) Interpretative annotation: Complete relief would suggest area to be the source of the pain.          Long-term benefit: Defined as the period of time past the expected duration of local anesthetics. With the possible exception of prolonged sympathetic blockade from the local anesthetics, benefits during this  period are typically attributed to, or  associated with, other factors such as analgesic sensory neuropraxia, antiinflammatory effects, or beneficial biochemical changes provided by agents other than the local anesthetics Reported result: Extended relief following procedure: 100 % (Long-Term Relief) Interpretative annotation: Good relief. This could suggest inflammation to be a significant component in the etiology to the pain.          Current benefits: Defined as persistent relief that continues at this point in time.   Reported results: Treated area: 100 % [name] reports improvement in function Interpretative annotation: Ongoing benefits would suggest effective therapeutic approach  Interpretation: Results would suggest a successful diagnostic intervention.          Laboratory Chemistry  Inflammation Markers No results found for: CRP, ESRSEDRATE (CRP: Acute Phase) (ESR: Chronic Phase) Renal Function Markers Lab Results  Component Value Date   BUN 19 01/09/2016   CREATININE 0.81 01/09/2016   Hepatic Function Markers Lab Results  Component Value Date   AST 14 07/16/2015   ALT 17 07/16/2015   ALBUMIN 4.2 07/16/2015   ALKPHOS 88 07/16/2015   Electrolytes Lab Results  Component Value Date   NA 138 01/09/2016   K 4.0 01/09/2016   CL 100 01/09/2016   CALCIUM 9.6 01/09/2016   Neuropathy Markers No results found for: [id number] Bone Pathology Markers Lab Results  Component Value Date   ALKPHOS 88 07/16/2015   VD25OH 26.52 (L) 01/09/2016   CALCIUM 9.6 01/09/2016   Coagulation Parameters Lab Results  Component Value Date   PLT 243.0 01/09/2016   Cardiovascular Markers Lab Results  Component Value Date   HGB 13.7 01/09/2016   HCT 41.4 01/09/2016   Note: Lab results reviewed.  Recent Diagnostic Imaging Review  Dg C-arm 1-60 Min-no Report  Result Date: 04/09/2016 There is no Radiologist interpretation  for this exam.  Note: Imaging results reviewed.          Meds  The patient has a current  medication list which includes the following prescription(s): albuterol, atorvastatin, fluticasone, fluticasone-salmeterol, levothyroxine, metformin, and montelukast.  Current Outpatient Prescriptions on File Prior to Visit  Medication Sig  . albuterol (PROVENTIL HFA;VENTOLIN HFA) 108 (90 Base) MCG/ACT inhaler Inhale 2 puffs into the lungs every 6 (six) hours as needed for wheezing or shortness of breath.  Marland Kitchen atorvastatin (LIPITOR) 10 MG tablet TAKE 1 TABLET DAILY  . fluticasone (FLONASE) 50 MCG/ACT nasal spray Place 2 sprays into both nostrils daily.  . Fluticasone-Salmeterol (ADVAIR) 250-50 MCG/DOSE AEPB Inhale 1 puff into the lungs 2 (two) times daily.  Marland Kitchen levothyroxine (SYNTHROID, LEVOTHROID) 175 MCG tablet Take 1 tablet (175 mcg total) by mouth daily.  . metFORMIN (GLUCOPHAGE) 500 MG tablet Take 1 tablet by mouth once daily with food for 2 weeks, then advance to 1 tablet twice daily with food thereafter.  . montelukast (SINGULAIR) 10 MG tablet Take 10 mg by mouth at bedtime.   No current facility-administered medications on file prior to visit.    ROS  Constitutional: Denies any fever or chills Gastrointestinal: No reported hemesis, hematochezia, vomiting, or acute GI distress Musculoskeletal: Denies any acute onset joint swelling, redness, loss of ROM, or weakness Neurological: No reported episodes of acute onset apraxia, aphasia, dysarthria, agnosia, amnesia, paralysis, loss of coordination, or loss of consciousness  Allergies  Molly Wall is allergic to iodine.  PFSH  Drug: [name]  has no drug history on file. Alcohol:  reports that she drinks alcohol. Tobacco:  reports that she has quit smoking.  Her smoking use included Cigarettes. She has never used smokeless tobacco. Medical:  has a past medical history of Allergic rhinitis; Asthma; Diabetes mellitus without complication ([place]); Hypercholesteremia; Hypothyroidism; Lumbar herniated disc; OSA (obstructive sleep apnea); and Vitamin  D deficiency. Family: family history includes Alzheimer's disease in her mother; Arthritis in her father; Asthma in her mother; Cancer in her sister.  Past Surgical History:  Procedure Laterality Date  . APPENDECTOMY    . GANGLION CYST EXCISION     71 years old  . KNEE SURGERY  2014  . SHOULDER SURGERY  1966   Constitutional Exam  General appearance: Well nourished, well developed, and well hydrated. In no apparent acute distress Vitals:   06/12/16 1408  BP: (!) 113/52  Pulse: 99  Resp: 16  Temp: 98 F (36.7 C)  TempSrc: Oral  SpO2: 97%  Weight: 232 lb (105.2 kg)  Height: 5' 5.5" (1.664 m)   BMI Assessment: Estimated body mass index is 38.02 kg/m as calculated from the following:   Height as of this encounter: 5' 5.5" (1.664 m).   Weight as of this encounter: 232 lb (105.2 kg).  BMI interpretation table: BMI level Category Range association with higher incidence of chronic pain  <18 kg/m2 Underweight   18.5-24.9 kg/m2 Ideal body weight   25-29.9 kg/m2 Overweight Increased incidence by 20%  30-34.9 kg/m2 Obese (Class I) Increased incidence by 68%  35-39.9 kg/m2 Severe obesity (Class II) Increased incidence by 136%  >40 kg/m2 Extreme obesity (Class III) Increased incidence by 254%   BMI Readings from Last 4 Encounters:  06/12/16 38.02 kg/m  04/09/16 38.02 kg/m  03/26/16 38.61 kg/m  02/13/16 38.02 kg/m   Wt Readings from Last 4 Encounters:  06/12/16 232 lb (105.2 kg)  04/09/16 232 lb (105.2 kg)  03/26/16 232 lb (105.2 kg)  02/13/16 232 lb (105.2 kg)  Psych/Mental status: Alert, oriented x 3 (person, place, & time)       Eyes: PERLA Respiratory: No evidence of acute respiratory distress  Cervical Spine Exam  Inspection: No masses, redness, or swelling Alignment: Symmetrical Functional ROM: Unrestricted ROM Stability: No instability detected Muscle strength & Tone: Functionally intact Sensory: Unimpaired Palpation: No palpable anomalies  Upper Extremity  (UE) Exam    Side: Right upper extremity  Side: Left upper extremity  Inspection: No masses, redness, swelling, or asymmetry. No contractures  Inspection: No masses, redness, swelling, or asymmetry. No contractures  Functional ROM: Unrestricted ROM          Functional ROM: Unrestricted ROM          Muscle strength & Tone: Functionally intact  Muscle strength & Tone: Functionally intact  Sensory: Unimpaired  Sensory: Unimpaired  Palpation: No palpable anomalies  Palpation: No palpable anomalies  Specialized Test(s): Deferred         Specialized Test(s): Deferred          Thoracic Spine Exam  Inspection: No masses, redness, or swelling Alignment: Symmetrical Functional ROM: Unrestricted ROM Stability: No instability detected Sensory: Unimpaired Muscle strength & Tone: No palpable anomalies  Lumbar Spine Exam  Inspection: No masses, redness, or swelling Alignment: Symmetrical Functional ROM: Unrestricted ROM Stability: No instability detected Muscle strength & Tone: Functionally intact Sensory: Unimpaired Palpation: No palpable anomalies Provocative Tests: Lumbar Hyperextension and rotation test: evaluation deferred today       Patrick's Maneuver: evaluation deferred today              Gait & Posture Assessment  Ambulation: Unassisted Gait: Relatively  normal for age and body habitus Posture: WNL   Lower Extremity Exam    Side: Right lower extremity  Side: Left lower extremity  Inspection: No masses, redness, swelling, or asymmetry. No contractures  Inspection: No masses, redness, swelling, or asymmetry. No contractures  Functional ROM: Unrestricted ROM          Functional ROM: Unrestricted ROM          Muscle strength & Tone: Functionally intact  Muscle strength & Tone: Functionally intact  Sensory: Unimpaired  Sensory: Unimpaired  Palpation: No palpable anomalies  Palpation: No palpable anomalies   Assessment  Primary Diagnosis & Pertinent Problem List: The primary encounter  diagnosis was Chronic low back pain (Location of Secondary source of pain) (Bilateral) (R>L). Diagnoses of Chronic lower extremity pain (Location of Primary Source of Pain) (Right) and Chronic lumbar radicular pain (Right) were also pertinent to this visit.  Status Diagnosis  Controlled Controlled Controlled 1. Chronic low back pain (Location of Secondary source of pain) (Bilateral) (R>L)   2. Chronic lower extremity pain (Location of Primary Source of Pain) (Right)   3. Chronic lumbar radicular pain (Right)      Plan of Care  Pharmacotherapy (Medications Ordered): No orders of the defined types were placed in this encounter.  New Prescriptions   No medications on file   Medications administered today: [name] had no medications administered during this visit. Lab-work, procedure(s), and/or referral(s): Orders Placed This Encounter  Procedures  . Lumbar Epidural Injection   Imaging and/or referral(s): None  Interventional therapies: Planned, scheduled, and/or pending:   Not at this time.   Considering:   Palliative right-sided L4-5 lumbar epidural steroid injection under fluoroscopic guidance and IV sedation    Palliative PRN treatment(s):   Palliative right-sided L4-5 lumbar epidural steroid injection under fluoroscopic guidance and IV sedation    Provider-requested follow-up: Return if symptoms worsen or fail to improve, for (PRN) procedure.  No future appointments. Primary Care Physician: [name], NP Location: [facility] Note by: Kathlen Brunswick. Molly Wall, M.D, DABA, DABAPM, DABPM, DABIPP, FIPP Date: 06/12/2016; Time: 2:37 PM  Pain Score Disclaimer: We use the NRS-11 scale. This is a self-reported, subjective measurement of pain severity with only modest accuracy. It is used primarily to identify changes within a particular patient. It must be understood that outpatient pain scales are significantly less accurate that those used  for research, where they can be applied under ideal controlled circumstances with minimal exposure to variables. In reality, the score is likely to be a combination of pain intensity and pain affect, where pain affect describes the degree of emotional arousal or changes in action readiness caused by the sensory experience of pain. Factors such as social and work situation, setting, emotional state, anxiety levels, expectation, and prior pain experience may influence pain perception and show large inter-individual differences that may also be affected by time variables.  Patient instructions provided during this appointment: Patient Instructions   GENERAL RISKS AND COMPLICATIONS  What are the risk, side effects and possible complications? Generally speaking, most procedures are safe.  However, with any procedure there are risks, side effects, and the possibility of complications.  The risks and complications are dependent upon the sites that are lesioned, or the type of nerve block to be performed.  The closer the procedure is to the spine, the more serious the risks are.  Great care is taken when placing the radio frequency needles, block needles or lesioning probes, but  sometimes complications can occur. 1. Infection: Any time there is an injection through the skin, there is a risk of infection.  This is why sterile conditions are used for these blocks.  There are four possible types of infection. 1. Localized skin infection. 2. Central Nervous System Infection-This can be in the form of Meningitis, which can be deadly. 3. Epidural Infections-This can be in the form of an epidural abscess, which can cause pressure inside of the spine, causing compression of the spinal cord with subsequent paralysis. This would require an emergency surgery to decompress, and there are no guarantees that the patient would recover from the paralysis. 4. Discitis-This is an infection of the intervertebral discs.  It occurs  in about 1% of discography procedures.  It is difficult to treat and it may lead to surgery.        2. Pain: the needles have to go through skin and soft tissues, will cause soreness.       3. Damage to internal structures:  The nerves to be lesioned may be near blood vessels or    other nerves which can be potentially damaged.       4. Bleeding: Bleeding is more common if the patient is taking blood thinners such as  aspirin, Coumadin, Ticiid, Plavix, etc., or if he/she have some genetic predisposition  such as hemophilia. Bleeding into the spinal canal can cause compression of the spinal  cord with subsequent paralysis.  This would require an emergency surgery to  decompress and there are no guarantees that the patient would recover from the  paralysis.       5. Pneumothorax:  Puncturing of a lung is a possibility, every time a needle is introduced in  the area of the chest or upper back.  Pneumothorax refers to free air around the  collapsed lung(s), inside of the thoracic cavity (chest cavity).  Another two possible  complications related to a similar event would include: Hemothorax and Chylothorax.   These are variations of the Pneumothorax, where instead of air around the collapsed  lung(s), you may have blood or chyle, respectively.       6. Spinal headaches: They may occur with any procedures in the area of the spine.       7. Persistent CSF (Cerebro-Spinal Fluid) leakage: This is a rare problem, but may occur  with prolonged intrathecal or epidural catheters either due to the formation of a fistulous  track or a dural tear.       8. Nerve damage: By working so close to the spinal cord, there is always a possibility of  nerve damage, which could be as serious as a permanent spinal cord injury with  paralysis.       9. Death:  Although rare, severe deadly allergic reactions known as "Anaphylactic  reaction" can occur to any of the medications used.      10. Worsening of the symptoms:  We can always  make thing worse.  What are the chances of something like this happening? Chances of any of this occuring are extremely low.  By statistics, you have more of a chance of getting killed in a motor vehicle accident: while driving to the hospital than any of the above occurring .  Nevertheless, you should be aware that they are possibilities.  In general, it is similar to taking a shower.  Everybody knows that you can slip, hit your head and get killed.  Does that mean that you should  not shower again?  Nevertheless always keep in mind that statistics do not mean anything if you happen to be on the wrong side of them.  Even if a procedure has a 1 (one) in a 1,000,000 (million) chance of going wrong, it you happen to be that one..Also, keep in mind that by statistics, you have more of a chance of having something go wrong when taking medications.  Who should not have this procedure? If you are on a blood thinning medication (e.g. Coumadin, Plavix, see list of "Blood Thinners"), or if you have an active infection going on, you should not have the procedure.  If you are taking any blood thinners, please inform your physician.  How should I prepare for this procedure?  Do not eat or drink anything at least six hours prior to the procedure.  Bring a driver with you .  It cannot be a taxi.  Come accompanied by an adult that can drive you back, and that is strong enough to help you if your legs get weak or numb from the local anesthetic.  Take all of your medicines the morning of the procedure with just enough water to swallow them.  If you have diabetes, make sure that you are scheduled to have your procedure done first thing in the morning, whenever possible.  If you have diabetes, take only half of your insulin dose and notify our nurse that you have done so as soon as you arrive at the clinic.  If you are diabetic, but only take blood sugar pills (oral hypoglycemic), then do not take them on the  morning of your procedure.  You may take them after you have had the procedure.  Do not take aspirin or any aspirin-containing medications, at least eleven (11) days prior to the procedure.  They may prolong bleeding.  Wear loose fitting clothing that may be easy to take off and that you would not mind if it got stained with Betadine or blood.  Do not wear any jewelry or perfume  Remove any nail coloring.  It will interfere with some of our monitoring equipment.  NOTE: Remember that this is not meant to be interpreted as a complete list of all possible complications.  Unforeseen problems may occur.  BLOOD THINNERS The following drugs contain aspirin or other products, which can cause increased bleeding during surgery and should not be taken for 2 weeks prior to and 1 week after surgery.  If you should need take something for relief of minor pain, you may take acetaminophen which is found in Tylenol,m Datril, Anacin-3 and Panadol. It is not blood thinner. The products listed below are.  Do not take any of the products listed below in addition to any listed on your instruction sheet.  A.P.C or A.P.C with Codeine Codeine Phosphate Capsules #3 Ibuprofen Ridaura  ABC compound Congesprin Imuran rimadil  Advil Cope Indocin Robaxisal  Alka-Seltzer Effervescent Pain Reliever and Antacid Coricidin or Coricidin-D  Indomethacin Rufen  Alka-Seltzer plus Cold Medicine Cosprin Ketoprofen S-A-C Tablets  Anacin Analgesic Tablets or Capsules Coumadin Korlgesic Salflex  Anacin Extra Strength Analgesic tablets or capsules CP-2 Tablets Lanoril Salicylate  Anaprox Cuprimine Capsules Levenox Salocol  Anexsia-D Dalteparin Magan Salsalate  Anodynos Darvon compound Magnesium Salicylate Sine-off  Ansaid Dasin Capsules Magsal Sodium Salicylate  Anturane Depen Capsules Marnal Soma  APF Arthritis pain formula Dewitt's Pills Measurin Stanback  Argesic Dia-Gesic Meclofenamic Sulfinpyrazone  Arthritis Bayer Timed  Release Aspirin Diclofenac Meclomen Sulindac  Arthritis pain formula  Anacin Dicumarol Medipren Supac  Analgesic (Safety coated) Arthralgen Diffunasal Mefanamic Suprofen  Arthritis Strength Bufferin Dihydrocodeine Mepro Compound Suprol  Arthropan liquid Dopirydamole Methcarbomol with Aspirin Synalgos  ASA tablets/Enseals Disalcid Micrainin Tagament  Ascriptin Doan's Midol Talwin  Ascriptin A/D Dolene Mobidin Tanderil  Ascriptin Extra Strength Dolobid Moblgesic Ticlid  Ascriptin with Codeine Doloprin or Doloprin with Codeine Momentum Tolectin  Asperbuf Duoprin Mono-gesic Trendar  Aspergum Duradyne Motrin or Motrin IB Triminicin  Aspirin plain, buffered or enteric coated Durasal Myochrisine Trigesic  Aspirin Suppositories Easprin Nalfon Trillsate  Aspirin with Codeine Ecotrin Regular or Extra Strength Naprosyn Uracel  Atromid-S Efficin Naproxen Ursinus  Auranofin Capsules Elmiron Neocylate Vanquish  Axotal Emagrin Norgesic Verin  Azathioprine Empirin or Empirin with Codeine Normiflo Vitamin E  Azolid Emprazil Nuprin Voltaren  Bayer Aspirin plain, buffered or children's or timed BC Tablets or powders Encaprin Orgaran Warfarin Sodium  Buff-a-Comp Enoxaparin Orudis Zorpin  Buff-a-Comp with Codeine Equegesic Os-Cal-Gesic   Buffaprin Excedrin plain, buffered or Extra Strength Oxalid   Bufferin Arthritis Strength Feldene Oxphenbutazone   Bufferin plain or Extra Strength Feldene Capsules Oxycodone with Aspirin   Bufferin with Codeine Fenoprofen Fenoprofen Pabalate or Pabalate-SF   Buffets II Flogesic Panagesic   Buffinol plain or Extra Strength Florinal or Florinal with Codeine Panwarfarin   Buf-Tabs Flurbiprofen Penicillamine   Butalbital Compound Four-way cold tablets Penicillin   Butazolidin Fragmin Pepto-Bismol   Carbenicillin Geminisyn Percodan   Carna Arthritis Reliever Geopen Persantine   Carprofen Gold's salt Persistin   Chloramphenicol Goody's Phenylbutazone   Chloromycetin  [place] Piroxlcam   Clmetidine heparin Plaquenil   Cllnoril Hyco-pap Ponstel   Clofibrate Hydroxy chloroquine Propoxyphen         Before stopping any of these medications, be sure to consult the physician who ordered them.  Some, such as Coumadin (Warfarin) are ordered to prevent or treat serious conditions such as "deep thrombosis", "pumonary embolisms", and other heart problems.  The amount of time that you may need off of the medication may also vary with the medication and the reason for which you were taking it.  If you are taking any of these medications, please make sure you notify your pain physician before you undergo any procedures.         Epidural Steroid Injection An epidural steroid injection is a shot of steroid medicine and numbing medicine that is given into the space between the spinal cord and the bones in your back (epidural space). The shot helps relieve pain caused by an irritated or swollen nerve root. The amount of pain relief you get from the injection depends on what is causing the nerve to be swollen and irritated, and how long your pain lasts. You are more likely to benefit from this injection if your pain is strong and comes on suddenly rather than if you have had pain for a long time. Tell a health care provider about:  Any allergies you have.  All medicines you are taking, including vitamins, herbs, eye drops, creams, and over-the-counter medicines.  Any problems you or family members have had with anesthetic medicines.  Any blood disorders you have.  Any surgeries you have had.  Any medical conditions you have.  Whether you are pregnant or may be pregnant. What are the risks? Generally, this is a safe procedure. However, problems may occur, including:  Headache.  Bleeding.  Infection.  Allergic reaction to medicines.  Damage to your nerves. What happens before the procedure? Staying hydrated  Follow instructions from your  health care  provider about hydration, which may include:  Up to 2 hours before the procedure - you may continue to drink clear liquids, such as water, clear fruit juice, black coffee, and plain tea. Eating and drinking restrictions  Follow instructions from your health care provider about eating and drinking, which may include:  8 hours before the procedure - stop eating heavy meals or foods such as meat, fried foods, or fatty foods.  6 hours before the procedure - stop eating light meals or foods, such as toast or cereal.  6 hours before the procedure - stop drinking milk or drinks that contain milk.  2 hours before the procedure - stop drinking clear liquids. Medicine   You may be given medicines to lower anxiety.  Ask your health care provider about:  Changing or stopping your regular medicines. This is especially important if you are taking diabetes medicines or blood thinners.  Taking medicines such as aspirin and ibuprofen. These medicines can thin your blood. Do not take these medicines before your procedure if your health care provider instructs you not to. General instructions   Plan to have someone take you home from the hospital or clinic. What happens during the procedure?  You may receive a medicine to help you relax (sedative).  You will be asked to lie on your abdomen.  The injection site will be cleaned.  A numbing medicine (local anesthetic) will be used to numb the injection site.  A needle will be inserted through your skin into the epidural space. You may feel some discomfort when this happens. An X-ray machine will be used to make sure the needle is put as close as possible to the affected nerve.  A steroid medicine and a local anesthetic will be injected into the epidural space.  The needle will be removed.  A bandage (dressing) will be put over the injection site. What happens after the procedure?  Your blood pressure, heart rate, breathing rate, and blood  oxygen level will be monitored until the medicines you were given have worn off.  Your arm or leg may feel weak or numb for a few hours.  The injection site may feel sore.  Do not drive for 24 hours if you received a sedative. This information is not intended to replace advice given to you by your health care provider. Make sure you discuss any questions you have with your health care provider. Document Released: 06/10/2007 Document Revised: 08/15/2015 Document Reviewed: 06/19/2015 Elsevier Interactive Patient Education  2017 [employer].

## 2016-06-20 ENCOUNTER — Telehealth: Payer: Self-pay | Admitting: Primary Care

## 2016-06-20 NOTE — Telephone Encounter (Signed)
Spoken and notified patient of Kate's comments. Patient verbalized understanding. 

## 2016-06-20 NOTE — Telephone Encounter (Signed)
Samantha transferred pt to me; pt has been recently started on diabetic med and is not sure what is causing dizziness. Pt said there is no UC in [place]. I googled and there is an UC on [address] [place]; I called the UC and they are open. Pt voiced understanding and will go to UC.

## 2016-06-20 NOTE — Telephone Encounter (Signed)
She could try Meclizine tablets over the counter if her symptoms are vertigo related. Start by taking 1/2 tablet first as this may cause drowsiness.

## 2016-06-20 NOTE — Telephone Encounter (Signed)
[facility] Patient Name: Molly Wall DOB: [birth year] Initial Comment Caller states that she has bad allergies and having some inner ear issues that is causing her to become dizzy and nauseous. She states that she has to lay down often. She wants to know if there is anything over the counter she can get to her equilibrium back. Nurse Assessment Nurse: Richardson Landry, RN, Aldona Bar Date/Time (Eastern Time): 06/20/2016 2:50:55 PM Confirm and document reason for call. If symptomatic, describe symptoms. ---Caller states at right ear and jaw bone is hard and tender. She lays down and is ok but when she gets up she has a rocking dizzy sensation. Sx began yesterday, off and on. Worse for the past 4-5 hours, has been using nasal spary and lavendar, lemon and peppermint essential oils. Pt has had lots on congestion in the past few days. Pt has sinus pressure. Denies fever. Does the patient have any new or worsening symptoms? ---Yes Will a triage be completed? ---Yes Related visit to physician within the last 2 weeks? ---Yes Does the PT have any chronic conditions? (i.e. diabetes, asthma, etc.) ---Yes List chronic conditions. ---asthma, diabetes but had steroid epidural at the time of the test so unsure if she really has diabetes, takes 2 pills but makes her tired, so she is only taking 1 pill per day. Is this a behavioral health or substance abuse call? ---No Guidelines Guideline Title Affirmed Question Affirmed Notes Dizziness - Lightheadedness [1] MODERATE dizziness (e.g., interferes with normal activities) AND [2] has NOT been evaluated by physician for this (Exception: dizziness caused by heat exposure, sudden standing, or poor fluid intake) Final Disposition User See Physician within 24 Hours [place], RN, Aldona Bar Comments Pt needs another test when she gets back in town in 2 weeks. Caller is out  of town and unable to make an appt Caller states she cannot make an appt, will not be back until a week and a half, caller states there is not a place available where she can go to seek care. RN warm transferred caller to [facility], nurse in office. PLEASE NOTE: All timestamps contained within this report are represented as [place] Standard Time. CONFIDENTIALTY NOTICE: This fax transmission is intended only for the addressee. It contains information that is legally privileged, confidential or otherwise protected from use or disclosure. If you are not the intended recipient, you are strictly prohibited from reviewing, disclosing, copying using or disseminating any of this information or taking any action in reliance on or regarding this information. If you have received this fax in error, please notify us immediately by telephone so that we can arrange for its return to [place]. Phone: [phone], Toll-Free: [phone], Fax: [phone] Page: 2 of 2 Call Id: [id number] Referrals GO TO FACILITY UNDECIDED Disagree/Comply: Disagree Disagree/Comply Reason: Disagree with instructions

## 2016-08-14 ENCOUNTER — Other Ambulatory Visit: Payer: Self-pay | Admitting: Primary Care

## 2016-08-14 DIAGNOSIS — J45909 Unspecified asthma, uncomplicated: Secondary | ICD-10-CM

## 2016-08-21 ENCOUNTER — Other Ambulatory Visit: Payer: Self-pay | Admitting: Internal Medicine

## 2016-08-21 DIAGNOSIS — E119 Type 2 diabetes mellitus without complications: Secondary | ICD-10-CM

## 2016-09-06 ENCOUNTER — Other Ambulatory Visit: Payer: Self-pay | Admitting: Primary Care

## 2016-09-10 ENCOUNTER — Telehealth: Payer: Self-pay

## 2016-09-10 NOTE — Telephone Encounter (Signed)
Pt left v/m;pt cpap has broken. pt got cpap in 2013 when pt lived in [place]. Pt request new rx for cpap machine. Pt request cb when done; pt is presently out of town but will be back in town for 4 days 09/15/16 - 09/18/16 and then pt will be out of country for 2 weeks. Pt request cb.

## 2016-09-10 NOTE — Telephone Encounter (Signed)
I discussed with Molly Wall. She can give RX for the tubing, mask, etc but she does not write orders to replace machines. Does she have a pulmonologist?

## 2016-09-11 NOTE — Telephone Encounter (Signed)
Message left for patient to return my call.  

## 2016-09-12 ENCOUNTER — Other Ambulatory Visit: Payer: Self-pay | Admitting: Internal Medicine

## 2016-09-12 DIAGNOSIS — G4733 Obstructive sleep apnea (adult) (pediatric): Secondary | ICD-10-CM

## 2016-09-12 NOTE — Telephone Encounter (Signed)
Spoken and notified patient of Regina's comments. Patient stated that she needs a whole new machine. She stated that she does not have pulmonologist. She like [place] to referral her to one.  Also patient stated that she have been having a lot of cramps. She was told by [facility] that she will need to contact her doctor's office for script such as magnesium and potassium. I asked her that she can get them OTC but patient insisting that [facility] said it will be better to get Rx. Please advise.  However, patient will be out of the country on 09/22/2016 and will not be back until 10/03/2016

## 2016-09-12 NOTE — Telephone Encounter (Signed)
Referral to pulmonology placed. She needs to make an appt to discuss cramps and we need to check labs to see if she needs any potassium or magnesium supplements before we just order them

## 2016-09-15 NOTE — Telephone Encounter (Addendum)
Spoken to patient this morning. Spoken and notified patient of Molly Wall's comments. Patient verbalized understanding. She stated that she will have to wait until after her trip to come in the office. She does not have time to come until then.

## 2016-10-04 ENCOUNTER — Other Ambulatory Visit: Payer: Self-pay | Admitting: Primary Care

## 2016-10-04 DIAGNOSIS — E039 Hypothyroidism, unspecified: Secondary | ICD-10-CM

## 2016-10-30 ENCOUNTER — Institutional Professional Consult (permissible substitution): Payer: BLUE CROSS/BLUE SHIELD | Admitting: Pulmonary Disease

## 2016-11-18 ENCOUNTER — Telehealth: Payer: Self-pay

## 2016-11-18 NOTE — Telephone Encounter (Signed)
Attempted to schedule an appt for a sleep consult.  Patient cancelled previous consult and is not interested in r/s at this time.

## 2016-11-20 ENCOUNTER — Other Ambulatory Visit: Payer: Self-pay | Admitting: Primary Care

## 2016-11-20 DIAGNOSIS — E119 Type 2 diabetes mellitus without complications: Secondary | ICD-10-CM

## 2016-12-07 ENCOUNTER — Other Ambulatory Visit: Payer: Self-pay | Admitting: Primary Care

## 2016-12-25 ENCOUNTER — Ambulatory Visit (INDEPENDENT_AMBULATORY_CARE_PROVIDER_SITE_OTHER): Payer: BLUE CROSS/BLUE SHIELD

## 2016-12-25 DIAGNOSIS — Z23 Encounter for immunization: Secondary | ICD-10-CM

## 2016-12-26 ENCOUNTER — Other Ambulatory Visit: Payer: Self-pay | Admitting: Primary Care

## 2016-12-26 DIAGNOSIS — J45909 Unspecified asthma, uncomplicated: Secondary | ICD-10-CM

## 2016-12-26 MED ORDER — ALBUTEROL SULFATE HFA 108 (90 BASE) MCG/ACT IN AERS: 2.0000 | INHALATION_SPRAY | Freq: Four times a day (QID) | RESPIRATORY_TRACT | 0 refills | Status: DC | PRN

## 2016-12-26 NOTE — Addendum Note (Signed)
Addended by: Jacqualin Combes on: 12/26/2016 12:57 PM   Modules accepted: Orders

## 2017-01-04 ENCOUNTER — Other Ambulatory Visit: Payer: Self-pay | Admitting: Primary Care

## 2017-01-04 DIAGNOSIS — E039 Hypothyroidism, unspecified: Secondary | ICD-10-CM

## 2017-02-18 ENCOUNTER — Other Ambulatory Visit: Payer: Self-pay | Admitting: Primary Care

## 2017-02-18 DIAGNOSIS — E119 Type 2 diabetes mellitus without complications: Secondary | ICD-10-CM

## 2017-03-07 ENCOUNTER — Other Ambulatory Visit: Payer: Self-pay | Admitting: Primary Care

## 2017-03-07 DIAGNOSIS — J45909 Unspecified asthma, uncomplicated: Secondary | ICD-10-CM

## 2017-03-09 MED ORDER — ALBUTEROL SULFATE HFA 108 (90 BASE) MCG/ACT IN AERS: 2.0000 | INHALATION_SPRAY | Freq: Four times a day (QID) | RESPIRATORY_TRACT | 0 refills | Status: DC | PRN

## 2017-03-09 NOTE — Addendum Note (Signed)
Addended by: Jacqualin Combes on: 03/09/2017 10:15 AM   Modules accepted: Orders

## 2017-03-10 ENCOUNTER — Other Ambulatory Visit: Payer: Self-pay | Admitting: Primary Care

## 2017-03-23 DIAGNOSIS — Z139 Encounter for screening, unspecified: Secondary | ICD-10-CM | POA: Diagnosis not present

## 2017-03-23 DIAGNOSIS — J4520 Mild intermittent asthma, uncomplicated: Secondary | ICD-10-CM | POA: Diagnosis not present

## 2017-03-23 DIAGNOSIS — E039 Hypothyroidism, unspecified: Secondary | ICD-10-CM | POA: Diagnosis not present

## 2017-03-23 DIAGNOSIS — E119 Type 2 diabetes mellitus without complications: Secondary | ICD-10-CM | POA: Diagnosis not present

## 2017-03-23 DIAGNOSIS — K5900 Constipation, unspecified: Secondary | ICD-10-CM | POA: Diagnosis not present

## 2017-04-07 ENCOUNTER — Other Ambulatory Visit: Payer: Self-pay | Admitting: Primary Care

## 2017-04-07 DIAGNOSIS — E039 Hypothyroidism, unspecified: Secondary | ICD-10-CM

## 2017-04-13 ENCOUNTER — Encounter: Payer: Self-pay | Admitting: Primary Care

## 2017-04-13 ENCOUNTER — Encounter: Payer: Self-pay | Admitting: Gastroenterology

## 2017-04-13 ENCOUNTER — Ambulatory Visit (INDEPENDENT_AMBULATORY_CARE_PROVIDER_SITE_OTHER): Payer: BLUE CROSS/BLUE SHIELD | Admitting: Primary Care

## 2017-04-13 VITALS — BP 116/70 | HR 88 | Temp 98.1°F | Ht 65.5 in | Wt 188.0 lb

## 2017-04-13 DIAGNOSIS — E119 Type 2 diabetes mellitus without complications: Secondary | ICD-10-CM | POA: Diagnosis not present

## 2017-04-13 DIAGNOSIS — K5903 Drug induced constipation: Secondary | ICD-10-CM | POA: Insufficient documentation

## 2017-04-13 DIAGNOSIS — J4520 Mild intermittent asthma, uncomplicated: Secondary | ICD-10-CM

## 2017-04-13 DIAGNOSIS — Z1231 Encounter for screening mammogram for malignant neoplasm of breast: Secondary | ICD-10-CM | POA: Diagnosis not present

## 2017-04-13 DIAGNOSIS — Z794 Long term (current) use of insulin: Secondary | ICD-10-CM | POA: Diagnosis not present

## 2017-04-13 DIAGNOSIS — Z1211 Encounter for screening for malignant neoplasm of colon: Secondary | ICD-10-CM | POA: Diagnosis not present

## 2017-04-13 DIAGNOSIS — E039 Hypothyroidism, unspecified: Secondary | ICD-10-CM

## 2017-04-13 DIAGNOSIS — K5900 Constipation, unspecified: Secondary | ICD-10-CM

## 2017-04-13 DIAGNOSIS — Z1239 Encounter for other screening for malignant neoplasm of breast: Secondary | ICD-10-CM

## 2017-04-13 LAB — TSH: TSH: 0.3 u[IU]/mL — ABNORMAL LOW (ref 0.35–4.50)

## 2017-04-13 NOTE — Patient Instructions (Signed)
[facility] to schedule your [facility].  You will be contacted regarding your referral to GI for the colonoscopy.  Please let us know if you have not been contacted within one week.   Stop by the lab prior to leaving today. I will notify you of your results once received.   It was a pleasure to see you today!

## 2017-04-13 NOTE — Progress Notes (Signed)
Subjective:    Patient ID: Tabbetha Wall, female    DOB: 1945-11-22, 72 y.o.   MRN: [id number]  HPI  Molly Wall is a 72 year old female who presents today for follow up. She is following with her PCP in [facility] for diabetes. She is due for her mammogram and overdue for repeat colonoscopy.  1) Type 2 Diabetes:   Current medications include: Victoza and basaglar 9 units HS. This was initiated by her PCP in another town and is following with her PCP in [place] for her diabetes. Her PCP had been weaning her off of basaglar for months, recently started increasing due to hyperglycemia.   AM Fasting: 300's for the past 2 weeks. Before Dinner: high 200's to low 300's for the past 2 weeks  Last A1C: 7.0 December 2018 Last Eye Exam: Completed in 2018 ACE/ARB: Completed urine microalbumin by PCP in [place]: Following with PCP in [facility].   2) Hypothyroidism: Currently managed on levothyroxine 175 mcg. Her last TSH on file was 1.65 in October 2017. She denies recent re-check.   3) Chronic Asthma: Currently managed on Advair 250-50 mcg and albuterol HFA. Also managed on Singulair and Flonase. She's only taking Singulair for now. Uses Advair during Spring months. Uses albuterol 1-2 times every 6 months.  4) Constipation: Present since diabetes diagnosis. Will go 4-5 days without a bowel movement. She'll experience abdominal bloating and intermittent pain to RLQ. History of appendectomy 10 years ago. She underwent xray of her abdomen from her PCP in [facility] which showed constipation. Her last colonoscopy was over 10 years ago. She's been taking stool softeners intermittently with improvement. Denies rectal bleeding. Family history of colon cancer in paternal grandmother.   Review of Systems  Eyes: Negative for visual disturbance.  Respiratory: Negative for shortness of breath.   Cardiovascular: Negative for chest pain.  Gastrointestinal: Positive for constipation. Negative for  blood in stool.  Neurological: Negative for dizziness and headaches.       Past Medical History:  Diagnosis Date  . Allergic rhinitis   . Asthma   . Diabetes mellitus without complication ([place])   . Hypercholesteremia   . Hypothyroidism   . Lumbar herniated disc   . OSA (obstructive sleep apnea)   . Vitamin D deficiency      Social History   Socioeconomic History  . Marital status: Single    Spouse name: Not on file  . Number of children: Not on file  . Years of education: Not on file  . Highest education level: Not on file  Social Needs  . Financial resource strain: Not on file  . Food insecurity - worry: Not on file  . Food insecurity - inability: Not on file  . Transportation needs - medical: Not on file  . Transportation needs - non-medical: Not on file  Occupational History  . Not on file  Tobacco Use  . Smoking status: Former Smoker    Types: Cigarettes  . Smokeless tobacco: Never Used  Substance and Sexual Activity  . Alcohol use: Yes    Comment: 2 per week  . Drug use: Not on file  . Sexual activity: Not on file  Other Topics Concern  . Not on file  Social History Narrative   Single.   Moved from [place] to [place] several weeks ago.   Family lives in [place].   Professor of sociology and psychology.   Enjoys spending time on the computer and teaching online,  spending time with her family.    Past Surgical History:  Procedure Laterality Date  . APPENDECTOMY    . GANGLION CYST EXCISION     72 years old  . KNEE SURGERY  2014  . SHOULDER SURGERY  1966    Family History  Problem Relation Age of Onset  . Alzheimer's disease Mother   . Asthma Mother   . Arthritis Father   . Cancer Sister     Allergies  Allergen Reactions  . Iodine Anaphylaxis    Radioactive iodine dye    Current Outpatient Medications on File Prior to Visit  Medication Sig Dispense Refill  . albuterol (PROAIR HFA) 108 (90 Base) MCG/ACT inhaler Inhale 2 puffs into the lungs every  6 (six) hours as needed for wheezing or shortness of breath. NEED APPOINTMENT FOR ANY MORE REFILLS 18 g 0  . atorvastatin (LIPITOR) 10 MG tablet Take 1 tablet (10 mg total) by mouth daily. NEED OFFICE VISIT FOR ANY MORE REFILLS 90 tablet 0  . B-D UF III MINI PEN NEEDLES 31G X 5 MM MISC USE AS DIRECTED USE DAILY TO ADMINISTER VICTOZA  3  . fluticasone (FLONASE) 50 MCG/ACT nasal spray Place 2 sprays into both nostrils daily. 48 g 1  . Fluticasone-Salmeterol (ADVAIR) 250-50 MCG/DOSE AEPB Inhale 1 puff into the lungs 2 (two) times daily.    . Insulin Glargine (BASAGLAR KWIKPEN) 100 UNIT/ML SOPN Inject 9 Units into the skin daily at 10 pm.     . levothyroxine (SYNTHROID, LEVOTHROID) 175 MCG tablet Take 1 tablet (175 mcg total) by mouth daily. NEED OFFICE VISIT FOR ANY MORE REFILLS 90 tablet 0  . montelukast (SINGULAIR) 10 MG tablet Take 10 mg by mouth at bedtime.    Marland Kitchen VICTOZA 18 MG/3ML SOPN INJECT 0.6 MG SUBCUTANEOUSLY DAILY FOR 2 WEEKS, THEN 1.2 FOR 2 WEEKS THEN 1.8 MG THEREAFTER  6   No current facility-administered medications on file prior to visit.     BP 116/70   Pulse 88   Temp 98.1 F (36.7 C) (Oral)   Ht 5' 5.5" (1.664 m)   Wt 188 lb (85.3 kg)   SpO2 98%   BMI 30.81 kg/m    Objective:   Physical Exam  Constitutional: She appears well-nourished.  Neck: Neck supple.  Cardiovascular: Normal rate and regular rhythm.  Pulmonary/Chest: Effort normal and breath sounds normal.  Skin: Skin is warm and dry.  Psychiatric: She has a normal mood and affect.          Assessment & Plan:

## 2017-04-13 NOTE — Assessment & Plan Note (Signed)
Overall stable, uses Advair during Spring months. Uses Singulair regularly.  Continue current regimen.

## 2017-04-13 NOTE — Assessment & Plan Note (Signed)
Following with PCP in [facility] mostly. Continue Victoza. Increase basaglar to 12 units HS x 2 weeks, then 15 units if blood sugars are above 150.

## 2017-04-13 NOTE — Assessment & Plan Note (Signed)
Could be secondary to diabetes. Continue PRN stool softeners. Colonoscopy pending.

## 2017-04-13 NOTE — Assessment & Plan Note (Signed)
Repeat TSH pending. Will refill levothyroxine 175 mcg once TSH returns.

## 2017-04-14 ENCOUNTER — Other Ambulatory Visit: Payer: Self-pay | Admitting: Primary Care

## 2017-04-14 DIAGNOSIS — E039 Hypothyroidism, unspecified: Secondary | ICD-10-CM

## 2017-04-14 MED ORDER — LEVOTHYROXINE SODIUM 150 MCG PO TABS
ORAL_TABLET | ORAL | 0 refills | Status: DC
Start: 2017-04-14 — End: 2017-06-25

## 2017-04-16 ENCOUNTER — Encounter: Payer: Self-pay | Admitting: *Deleted

## 2017-04-16 ENCOUNTER — Telehealth: Payer: Self-pay

## 2017-04-16 NOTE — Telephone Encounter (Signed)
Copied from [place] [id number]. Topic: General - Other >> Apr 16, 2017 11:07 AM [name] L wrote: Reason for CRM: Patient would like a call back from [place] to discuss why her thyroid medication was reduced again. She already spoke w/ Vallarie Mare but had a hard time hearing her.

## 2017-04-16 NOTE — Telephone Encounter (Signed)
Spoken and notified patient of Kate's comments. Patient verbalized understanding. 

## 2017-04-22 ENCOUNTER — Ambulatory Visit (INDEPENDENT_AMBULATORY_CARE_PROVIDER_SITE_OTHER): Payer: BLUE CROSS/BLUE SHIELD | Admitting: Gastroenterology

## 2017-04-22 ENCOUNTER — Encounter: Payer: Self-pay | Admitting: Gastroenterology

## 2017-04-22 VITALS — BP 100/70 | HR 79 | Ht 65.0 in | Wt 184.0 lb

## 2017-04-22 DIAGNOSIS — K5900 Constipation, unspecified: Secondary | ICD-10-CM

## 2017-04-22 MED ORDER — PEG-KCL-NACL-NASULF-NA ASC-C 140 G PO SOLR: 140.0000 g | ORAL | 0 refills | Status: DC

## 2017-04-22 NOTE — Progress Notes (Signed)
[facility] Consult Note:  HistoryPhyllicia Wall 04/22/2017  Referring physician: [name], NP  Reason for consult/chief complaint: Constipation (onset: 6 to 8 months (when started Victoza), not having BMs every day, had xray dr gardner - stool )   Subjective  HPI:  This is a 72 year old woman referred to see [place] for chronic constipation.  It has been occurring for 6-8 months, she has abdominal bloating and might go 4-5 days without a bowel movement.  Her last colonoscopy was 11 years ago in [place].  Began with Victoza 6-8 months ago, and has been very healthy with both glucose control and the weight loss it has provided.  No rectal bleeding.  Several weeks ago, after a period of no bowel movement for several days, she took some stool softeners and MiraLAX but had no result.  She then had a thorough cleanout after magnesium citrate.  She was concerned however that a x-ray shortly after that still showed stool.  I reassured her that was a common and expected finding.  She did feel that the magnesium citrate worked very well.  Since she eats a healthy diet with a lot of food that she grows and prepares at home, plenty of fiber water and physical activity.  ROS:  Review of Systems  Constitutional: Negative for appetite change and unexpected weight change.  HENT: Negative for mouth sores and voice change.   Eyes: Negative for pain and redness.  Respiratory: Negative for cough and shortness of breath.   Cardiovascular: Negative for chest pain and palpitations.  Genitourinary: Negative for dysuria and hematuria.  Musculoskeletal: Negative for arthralgias and myalgias.  Skin: Negative for pallor and rash.  Neurological: Negative for weakness and headaches.  Hematological: Negative for adenopathy.     Past Medical History: Past Medical History:  Diagnosis Date  . Allergic rhinitis   . Asthma   . Diabetes mellitus without complication ([place])   .  Hypercholesteremia   . Hypothyroidism   . Lumbar herniated disc   . OSA (obstructive sleep apnea)   . Vitamin D deficiency      Past Surgical History: Past Surgical History:  Procedure Laterality Date  . APPENDECTOMY    . GANGLION CYST EXCISION     72 years old  . INCISION AND DRAINAGE ABSCESS ANAL    . KNEE SURGERY  2014  . SHOULDER SURGERY  1966     Family History: Family History  Problem Relation Age of Onset  . Alzheimer's disease Mother   . Asthma Mother   . Arthritis Father   . Breast cancer Sister   . Colon cancer Paternal Grandmother    No parents or siblings with CRC  Social History: Social History   Socioeconomic History  . Marital status: Single    Spouse name: None  . Number of children: None  . Years of education: None  . Highest education level: None  Social Needs  . Financial resource strain: None  . Food insecurity - worry: None  . Food insecurity - inability: None  . Transportation needs - medical: None  . Transportation needs - non-medical: None  Occupational History  . None  Tobacco Use  . Smoking status: Former Smoker    Types: Cigarettes  . Smokeless tobacco: Never Used  Substance and Sexual Activity  . Alcohol use: Yes    Comment: 2 per month  . Drug use: No  . Sexual activity: No  Other Topics Concern  . None  Social History  Narrative   Single.   Moved from [place] to [place] several weeks ago.   Family lives in [place].   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  Retired Visual merchandiser, still teaches a class online  Allergies: Allergies  Allergen Reactions  . Iodine Anaphylaxis    Radioactive iodine dye    Outpatient Meds: Current Outpatient Medications  Medication Sig Dispense Refill  . albuterol (PROAIR HFA) 108 (90 Base) MCG/ACT inhaler Inhale 2 puffs into the lungs every 6 (six) hours as needed for wheezing or shortness of  breath. NEED APPOINTMENT FOR ANY MORE REFILLS 18 g 0  . atorvastatin (LIPITOR) 10 MG tablet Take 1 tablet (10 mg total) by mouth daily. NEED OFFICE VISIT FOR ANY MORE REFILLS 90 tablet 0  . B-D UF III MINI PEN NEEDLES 31G X 5 MM MISC USE AS DIRECTED USE DAILY TO ADMINISTER VICTOZA  3  . fluticasone (FLONASE) 50 MCG/ACT nasal spray Place 2 sprays into both nostrils daily. (Patient taking differently: Place 2 sprays into both nostrils daily as needed. ) 48 g 1  . Fluticasone-Salmeterol (ADVAIR) 250-50 MCG/DOSE AEPB Inhale 1 puff into the lungs 2 (two) times daily as needed.     . Insulin Glargine (BASAGLAR KWIKPEN) 100 UNIT/ML SOPN Inject 12 Units into the skin daily at 10 pm.     . levothyroxine (SYNTHROID) 150 MCG tablet Take 1 tablet by mouth every morning on an empty stomach with a full glass of water. 90 tablet 0  . METFORMIN HCL PO Take by mouth daily.    . montelukast (SINGULAIR) 10 MG tablet Take 10 mg by mouth at bedtime.    Marland Kitchen VICTOZA 18 MG/3ML SOPN INJECT 0.6 MG SUBCUTANEOUSLY DAILY FOR 2 WEEKS, THEN 1.2 FOR 2 WEEKS THEN 1.8 MG THEREAFTER  6  . PEG-KCl-NaCl-NaSulf-Na Asc-C (PLENVU) 140 g SOLR Take 140 g by mouth as directed. 1 each 0   No current facility-administered medications for this visit.       ___________________________________________________________________ Objective   Exam:  BP 100/70   Pulse 79   Ht 5\' 5"  (1.651 m)   Wt 184 lb (83.5 kg)   BMI 30.62 kg/m    General: this is a(n) well-appearing woman  Eyes: sclera anicteric, no redness  ENT: oral mucosa moist without lesions, no cervical or supraclavicular lymphadenopathy, good dentition  CV: RRR without murmur, S1/S2, no JVD, no peripheral edema  Resp: clear to auscultation bilaterally, normal RR and effort noted  GI: soft, no tenderness, with active bowel sounds. No guarding or palpable organomegaly noted.  Skin; warm and dry, no rash or jaundice noted  Neuro: awake, alert and oriented x 3. Normal  gross motor function and fluent speech  Labs:  Recent TSH low at 0.3, Synthroid dose then reduced.  Radiologic Studies:  No outside imaging for review  Assessment: Encounter Diagnosis  Name Primary?  . Constipation, unspecified constipation type Yes    The timing seems to indicate it is most likely a side effect of Victoza, however she would really rather not stop that medicine since it has been so helpful.  I suggested that perhaps the MiraLAX may not have been as effective because it was only at taken during a period of severe constipation.  She has taken it occasionally since then, and I recommended she take between a half and whole capful once a day to have a bowel movement every 1-2 days.  I advised her to have a colonoscopy, although I have a relatively low suspicion of obstructive cause of this.  She is agreeable after thorough discussion of procedure and risks.  The benefits and risks of the planned procedure were described in detail with the patient or (when appropriate) their health care proxy.  Risks were outlined as including, but not limited to, bleeding, infection, perforation, adverse medication reaction leading to cardiac or pulmonary decompensation, or pancreatitis (if ERCP).  The limitation of incomplete mucosal visualization was also discussed.  No guarantees or warranties were given.    Thank you for the courtesy of this consult.  Please call me with any questions or concerns.  Nelida Meuse III  CC: [name], NP

## 2017-04-22 NOTE — Patient Instructions (Signed)
If you are age 70 or older, your body mass index should be between 23-30. Your Body mass index is 30.62 kg/m. If this is out of the aforementioned range listed, please consider follow up with your Primary Care Provider.  If you are age 72 or younger, your body mass index should be between 19-25. Your Body mass index is 30.62 kg/m. If this is out of the aformentioned range listed, please consider follow up with your Primary Care Provider.   You have been scheduled for a colonoscopy. Please follow written instructions given to you at your visit today.  Please pick up your prep supplies at the pharmacy within the next 1-3 days. If you use inhalers (even only as needed), please bring them with you on the day of your procedure. Your physician has requested that you go to www.startemmi.com and enter the access code given to you at your visit today. This web site gives a general overview about your procedure. However, you should still follow specific instructions given to you by our office regarding your preparation for the procedure.  Thank you for choosing [facility]  Dr Wilfrid Lund III

## 2017-05-15 ENCOUNTER — Telehealth: Payer: Self-pay | Admitting: Gastroenterology

## 2017-05-15 NOTE — Telephone Encounter (Signed)
Patient states that her blood sugars have been running 250-350 lately in the morning and evening. Her doctor has increase the Basaglar insulin by two units every 2 days, she will be taking 22 units by time her colonoscopy is next Wednesday. She wanted to let us know, questioning whether or not you want to proceed as scheduled.

## 2017-05-18 ENCOUNTER — Other Ambulatory Visit: Payer: Self-pay

## 2017-05-18 NOTE — Telephone Encounter (Signed)
Thank you for letting me know. I think it would be best to move the colonoscopy back 3-4 weeks to allow time for improved glucose control.

## 2017-05-18 NOTE — Telephone Encounter (Signed)
Due to patient's schedule, first available time frame to reschedule colonoscopy is not until 07/14/17. Sent message to Morton Amy to update ambulatory referral. Will mail patient new set of instructions with date/time.

## 2017-05-19 ENCOUNTER — Other Ambulatory Visit: Payer: Self-pay | Admitting: Primary Care

## 2017-05-20 ENCOUNTER — Encounter: Payer: BLUE CROSS/BLUE SHIELD | Admitting: Gastroenterology

## 2017-05-29 ENCOUNTER — Telehealth: Payer: Self-pay

## 2017-05-29 NOTE — Telephone Encounter (Signed)
I spoke with Mendel Ryder (DPR signed) a friend of family is optometrist and saw pt 05/28/17 and is going to reck pt today. Pt will cb if needed.

## 2017-05-29 NOTE — Telephone Encounter (Signed)
Noted  

## 2017-05-29 NOTE — Telephone Encounter (Signed)
PLEASE NOTE: All timestamps contained within this report are represented as [place] Standard Time. CONFIDENTIALTY NOTICE: This fax transmission is intended only for the addressee. It contains information that is legally privileged, confidential or otherwise protected from use or disclosure. If you are not the intended recipient, you are strictly prohibited from reviewing, disclosing, copying using or disseminating any of this information or taking any action in reliance on or regarding this information. If you have received this fax in error, please notify us immediately by telephone so that we can arrange for its return to [place]. Phone: [phone], Toll-Free: [phone], Fax: [phone] Page: 1 of 1 Call Id: [id number] Gil[place] Patient Name: Molly Wall Gender: Female DOB: [birth year] Age: 72 Y 78 M 5 D Return Phone Number: [phone] (Primary) Address: City/State/Zip: [name] [address] Client [place] Night - Client Client Site [facility] Physician Alma Friendly - NP Contact Type Call Who Is Calling Patient / Member / Family / Caregiver Call Type Triage / Clinical Relationship To Patient Self Return Phone Number [phone] (Primary) Chief Complaint EYE - struck or hit in the eye Reason for Call Symptomatic / Request for [name] states she is got stuck in the eye from a plant in the garden. It is stinging and throbbing. Visine is not helping. Feels like something may be in it. Translation No No Triage Reason Patient declined Nurse Assessment Nurse: Junius Creamer, RN, Debra Date/Time Eilene Ghazi Time): 05/28/2017 7:45:25 PM Confirm and document reason for call. If symptomatic, describe symptoms. ---Caller states thinks may have something stuck in eye. something jabbed in eye. stings and throbbing.  Visine is not helping. Feels like something may be in it. states dtr called her md and he is meeting them at his office, is on the way there now, declines triage. Does the patient have any new or worsening symptoms? ---Yes Will a triage be completed? ---No Select reason for no triage. ---Patient declined Guidelines Guideline Title Affirmed Question Affirmed Notes Nurse Date/Time (Eastern Time) Disp. Time Eilene Ghazi Time) Disposition Final User 05/28/2017 7:44:05 PM Send to Urgent Queue Alonza Smoker 05/28/2017 7:47:45 PM Clinical Call Yes Junius Creamer, RN, Hilda Blades

## 2017-06-18 ENCOUNTER — Ambulatory Visit
Admission: RE | Admit: 2017-06-18 | Discharge: 2017-06-18 | Disposition: A | Discharge status: Home / Self Care | Payer: BLUE CROSS/BLUE SHIELD | Source: Ambulatory Visit | Attending: Primary Care | Admitting: Primary Care

## 2017-06-18 DIAGNOSIS — Z1239 Encounter for other screening for malignant neoplasm of breast: Secondary | ICD-10-CM

## 2017-06-18 DIAGNOSIS — Z1231 Encounter for screening mammogram for malignant neoplasm of breast: Secondary | ICD-10-CM | POA: Diagnosis not present

## 2017-06-19 ENCOUNTER — Other Ambulatory Visit: Payer: Self-pay | Admitting: Primary Care

## 2017-06-19 DIAGNOSIS — R928 Other abnormal and inconclusive findings on diagnostic imaging of breast: Secondary | ICD-10-CM

## 2017-06-25 ENCOUNTER — Ambulatory Visit
Admission: RE | Admit: 2017-06-25 | Discharge: 2017-06-25 | Disposition: A | Discharge status: Home / Self Care | Payer: BLUE CROSS/BLUE SHIELD | Source: Ambulatory Visit | Attending: Primary Care | Admitting: Primary Care

## 2017-06-25 ENCOUNTER — Other Ambulatory Visit: Payer: Self-pay | Admitting: Primary Care

## 2017-06-25 DIAGNOSIS — E039 Hypothyroidism, unspecified: Secondary | ICD-10-CM

## 2017-06-25 DIAGNOSIS — R921 Mammographic calcification found on diagnostic imaging of breast: Secondary | ICD-10-CM

## 2017-06-25 DIAGNOSIS — R928 Other abnormal and inconclusive findings on diagnostic imaging of breast: Secondary | ICD-10-CM

## 2017-06-26 ENCOUNTER — Ambulatory Visit
Admission: RE | Admit: 2017-06-26 | Discharge: 2017-06-26 | Disposition: A | Discharge status: Home / Self Care | Payer: BLUE CROSS/BLUE SHIELD | Source: Ambulatory Visit | Attending: Primary Care | Admitting: Primary Care

## 2017-06-26 ENCOUNTER — Other Ambulatory Visit: Payer: Self-pay | Admitting: Primary Care

## 2017-06-26 DIAGNOSIS — N6091 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: Secondary | ICD-10-CM | POA: Diagnosis not present

## 2017-06-26 DIAGNOSIS — R921 Mammographic calcification found on diagnostic imaging of breast: Secondary | ICD-10-CM

## 2017-07-13 ENCOUNTER — Other Ambulatory Visit: Payer: Self-pay | Admitting: General Surgery

## 2017-07-13 DIAGNOSIS — N6091 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: Secondary | ICD-10-CM

## 2017-07-13 DIAGNOSIS — E119 Type 2 diabetes mellitus without complications: Secondary | ICD-10-CM | POA: Diagnosis not present

## 2017-07-13 DIAGNOSIS — J45998 Other asthma: Secondary | ICD-10-CM | POA: Diagnosis not present

## 2017-07-13 DIAGNOSIS — Z803 Family history of malignant neoplasm of breast: Secondary | ICD-10-CM | POA: Diagnosis not present

## 2017-07-14 ENCOUNTER — Other Ambulatory Visit: Payer: Self-pay | Admitting: General Surgery

## 2017-07-14 ENCOUNTER — Encounter: Payer: BLUE CROSS/BLUE SHIELD | Admitting: Gastroenterology

## 2017-07-14 DIAGNOSIS — N6091 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: Secondary | ICD-10-CM

## 2017-08-05 ENCOUNTER — Encounter (HOSPITAL_BASED_OUTPATIENT_CLINIC_OR_DEPARTMENT_OTHER): Payer: Self-pay | Admitting: *Deleted

## 2017-08-05 ENCOUNTER — Other Ambulatory Visit: Payer: Self-pay

## 2017-08-06 ENCOUNTER — Encounter (HOSPITAL_BASED_OUTPATIENT_CLINIC_OR_DEPARTMENT_OTHER)
Admission: RE | Admit: 2017-08-06 | Discharge: 2017-08-06 | Disposition: A | Discharge status: Home / Self Care | Payer: BLUE CROSS/BLUE SHIELD | Source: Ambulatory Visit | Attending: General Surgery | Admitting: General Surgery

## 2017-08-06 DIAGNOSIS — Z01818 Encounter for other preprocedural examination: Secondary | ICD-10-CM | POA: Diagnosis not present

## 2017-08-06 LAB — CBC WITH DIFFERENTIAL/PLATELET
Abs Immature Granulocytes: 0 10*3/uL (ref 0.0–0.1)
Basophils Absolute: 0 10*3/uL (ref 0.0–0.1)
Basophils Relative: 1 %
Eosinophils Absolute: 0.2 10*3/uL (ref 0.0–0.7)
Eosinophils Relative: 5 %
HCT: 45.8 % (ref 36.0–46.0)
Hemoglobin: 14.4 g/dL (ref 12.0–15.0)
Immature Granulocytes: 0 %
Lymphocytes Relative: 28 %
Lymphs Abs: 1.3 10*3/uL (ref 0.7–4.0)
MCH: 26.7 pg (ref 26.0–34.0)
MCHC: 31.4 g/dL (ref 30.0–36.0)
MCV: 85 fL (ref 78.0–100.0)
Monocytes Absolute: 0.3 10*3/uL (ref 0.1–1.0)
Monocytes Relative: 7 %
Neutro Abs: 2.9 10*3/uL (ref 1.7–7.7)
Neutrophils Relative %: 59 %
Platelets: 230 10*3/uL (ref 150–400)
RBC: 5.39 MIL/uL — ABNORMAL HIGH (ref 3.87–5.11)
RDW: 12.9 % (ref 11.5–15.5)
WBC: 4.8 10*3/uL (ref 4.0–10.5)

## 2017-08-06 LAB — COMPREHENSIVE METABOLIC PANEL
ALT: 15 U/L (ref 14–54)
AST: 14 U/L — ABNORMAL LOW (ref 15–41)
Albumin: 3.9 g/dL (ref 3.5–5.0)
Alkaline Phosphatase: 111 U/L (ref 38–126)
Anion gap: 10 (ref 5–15)
BUN: 12 mg/dL (ref 6–20)
CO2: 30 mmol/L (ref 22–32)
Calcium: 9.9 mg/dL (ref 8.9–10.3)
Chloride: 99 mmol/L — ABNORMAL LOW (ref 101–111)
Creatinine, Ser: 0.64 mg/dL (ref 0.44–1.00)
GFR calc Af Amer: >60 mL/min (ref >60)
GFR calc non Af Amer: >60 mL/min (ref >60)
Glucose, Bld: 257 mg/dL — ABNORMAL HIGH (ref 65–99)
Potassium: 4.6 mmol/L (ref 3.5–5.1)
Sodium: 139 mmol/L (ref 135–145)
Total Bilirubin: 0.5 mg/dL (ref 0.3–1.2)
Total Protein: 6.7 g/dL (ref 6.5–8.1)

## 2017-08-06 NOTE — Progress Notes (Signed)
Dr Cleatrice Burke of [facility]. Pt called and asked per Dr Luis Abed request and she states her fasting blood sugar is @80 -100. Pt to proceed with Ensure drink DOS and will recheck sugar DOS and cover with insulin if needed. Pt verbalized understanding.

## 2017-08-06 NOTE — Progress Notes (Signed)
Pt instructed on Ensure drink and pt verbalized understanding. Pt confused on few items for DOS so pt. Set down and reviewed information with [facility] pre-op nurse.

## 2017-08-09 NOTE — H&P (Signed)
[place] Location: [facility] Patient #: [id number] DOB: 10-26-1945 Divorced / Language: Cleophus Molt / Race: White Female        History of Present Illness       . This is a very pleasant 72 year old woman, referred by Dr. 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 Her PCP is Alma Friendly, nurse practitioner.      She has no prior history of breast problems. Annual screening mammograms are done and recent mammograms show suspicious areas of calcification in the right breast. One area is anterior, slightly medial retroareolar. A second area on the right is in the upper inner quadrant middle depth. Both of these areas were biopsied and both show atypical ductal hyperplasia.      Comorbidities include past history of sleep apnea but she does not use a device and doesn't think that's much of a problem. Degenerative disc disease. Non-insulin-dependent diabetes. Asthma. Hyperlipidemia. History appendectomy. Large lipoma excised left shoulder Family history reveals sister died of metastatic breast cancer at age 70. The patient worries about this. Mother living age 74. Has a dementia. Lives in [place]. Father deceased of sudden death syndrome paternal grandmother had colon cancer Social history reveals that she is divorced. Lives in [place]. Has 2 children. Former smoker. Occasional alcohol. 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     I  discussed the fact that ADH is a high risk lesion carrying somewhere between 15 and 18% risk of cancer. She is also advised that this increases her lifetime risk for breast cancer as does her sister's history of metastatic breast cancer. She is well aware of this and is ready to have these areas biopsied.      She'll be scheduled for right breast lumpectomy 2 with radioactive seed  localization 2. It is not clear whether this will be 1 or 2 incisions. I discussed the indications, details, techniques, and numerous risk of the surgery with her. She is aware the risk of bleeding, infection, cosmetic deformity, nerve damage with numbness or pain, reoperation of cancer, and other unforeseen problems. She understands these issues well. All of her questions were answered. She agrees with this plan      If this all turns out to be simply ADH, I told her I would offer to refer her to the high risk breast clinic for risk assessment and possible genetic counseling.  Orders have been placed in Epic This includes 2 radioactive seeds to right breast   Past Surgical History  Anal Fissure Repair  Appendectomy  Breast Biopsy  Right. Colon Polyp Removal - Colonoscopy  Knee Surgery  Right. Oral Surgery  Shoulder Surgery  Left. Thyroid Surgery  Tonsillectomy   Diagnostic Studies History Colonoscopy  >10 years ago Mammogram  within last year  Allergies  No Known Drug Allergies  Allergies Reconciled   Medication History  Basaglar KwikPen (100UNIT/ML Soln Pen-inj, Subcutaneous) Active. BD Pen Needle Mini U/F (31G X 5 MM Misc,) Active. Synthroid (150MCG Tablet, Oral) Active. Medications Reconciled  Social History  Alcohol use  Occasional alcohol use. Caffeine use  Coffee. No drug use   Family History  Breast Cancer  Sister. Diabetes Mellitus  Family Members In General. Heart disease in female family member before age 70  Respiratory Condition  Mother.  Pregnancy / Birth History  Age at menarche  81 years. Durenda Age  5 Irregular periods  Length (months) of breastfeeding  >24 Maternal age  39-25 Para  2  Other Problems  Back Pain  Diabetes Mellitus  Hemorrhoids  Thyroid Disease     Review of Systems  General Not Present- Appetite Loss, Chills, Fatigue, Fever, Night Sweats, Weight Gain and Weight Loss. HEENT Present-  Seasonal Allergies. Not Present- Earache, Hearing Loss, Hoarseness, Nose Bleed, Oral Ulcers, Ringing in the Ears, Sinus Pain, Sore Throat, Visual Disturbances, Wears glasses/contact lenses and Yellow Eyes. Respiratory Not Present- Bloody sputum, Chronic Cough, Difficulty Breathing, Snoring and Wheezing. Breast Present- Breast Pain. Not Present- Breast Mass, Nipple Discharge and Skin Changes. Cardiovascular Not Present- Chest Pain, Difficulty Breathing Lying Down, Leg Cramps, Palpitations, Rapid Heart Rate, Shortness of Breath and Swelling of Extremities. Gastrointestinal Not Present- Abdominal Pain, Bloating, Bloody Stool, Change in Bowel Habits, Chronic diarrhea, Constipation, Difficulty Swallowing, Excessive gas, Gets full quickly at meals, Hemorrhoids, Indigestion, Nausea, Rectal Pain and Vomiting. Female Genitourinary Present- Frequency and Nocturia. Not Present- Painful Urination, Pelvic Pain and Urgency. Musculoskeletal Present- Back Pain and Joint Pain. Not Present- Joint Stiffness, Muscle Pain, Muscle Weakness and Swelling of Extremities. Neurological Not Present- Decreased Memory, Fainting, Headaches, Numbness, Seizures, Tingling, Tremor, Trouble walking and Weakness. Psychiatric Not Present- Anxiety, Bipolar, Change in Sleep Pattern, Depression, Fearful and Frequent crying. Endocrine Present- New Diabetes. Not Present- Cold Intolerance, Excessive Hunger, Hair Changes, Heat Intolerance and Hot flashes. Hematology Not Present- Blood Thinners, Easy Bruising, Excessive bleeding, Gland problems, HIV and Persistent Infections.  Vitals  Weight: 190.2 lb Height: 65in Body Surface Area: 1.94 m Body Mass Index: 31.65 kg/m  Temp.: 98.56F  Pulse: 92 (Regular)  BP: 110/72 (Sitting, Left Arm, Standard)     Physical Exam  General Mental Status-Alert. General Appearance-Consistent with stated age. Hydration-Well hydrated. Voice-Normal.  Head and Neck Head-normocephalic,  atraumatic with no lesions or palpable masses. Trachea-midline. Thyroid Gland Characteristics - normal size and consistency.  Eye Eyeball - Bilateral-Extraocular movements intact. Sclera/Conjunctiva - Bilateral-No scleral icterus.  Chest and Lung Exam Chest and lung exam reveals -quiet, even and easy respiratory effort with no use of accessory muscles and on auscultation, normal breath sounds, no adventitious sounds and normal vocal resonance. Inspection Chest Wall - Normal. Back - normal.  Breast Note: Both breasts are examined. Biopsy sites right breast noted. No hematoma. No palpable mass in either breast. No other skin changes. Small pigmented nevus right infraclavicular area which she states she is unchanged for over a decade. No axillary adenopathy.   Cardiovascular Cardiovascular examination reveals -normal heart sounds, regular rate and rhythm with no murmurs and normal pedal pulses bilaterally.  Abdomen Inspection Inspection of the abdomen reveals - No Hernias. Skin - Scar - Note: Appendectomy scar. Palpation/Percussion Palpation and Percussion of the abdomen reveal - Soft, Non Tender, No Rebound tenderness, No Rigidity (guarding) and No hepatosplenomegaly. Auscultation Auscultation of the abdomen reveals - Bowel sounds normal.  Neurologic Neurologic evaluation reveals -alert and oriented x 3 with no impairment of recent or remote memory. Mental Status-Normal.  Musculoskeletal Normal Exam - Left-Upper Extremity Strength Normal and Lower Extremity Strength Normal. Normal Exam - Right-Upper Extremity Strength Normal and Lower Extremity Strength Normal. Note: Linear scar left deltoid area   Lymphatic Head & Neck  General Head & Neck Lymphatics: Bilateral - Description - Normal. Axillary  General Axillary Region: Bilateral - Description - Normal. Tenderness - Non Tender. Femoral & Inguinal  Generalized Femoral & Inguinal Lymphatics: Bilateral -  Description - Normal. Tenderness - Non Tender.  Assessment & Plan  ATYPICAL DUCTAL HYPERPLASIA OF RIGHT BREAST (N60.91)   Your recent imaging studies and biopsies showed 2 separate areas in the right breast that were biopsied and showed atypical ductal hyperplasia This is not cancer but is a high risk lesion There is somewhere between a 15 and 18% chance that you have cancer right now It also increases your lifetime risk of breast cancer Your sister had breast cancer and that also increases your risk  The next step in your care is to remove both of these lesions with conservative surgery we do this with radioactive seed localization This may take 1 or 2 incisions, as we discussed If this turns out to be cancer then you will treated appropriately. Most likely you do not have cancer If the excisional biopsies show just atypical ductal hyperplasia, then I would offer to refer you to the high risk clinic, be evaluated by a breast oncologist, and to see if genetic testing or chemoprevention would be of benefit to you.  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 Dalbert Batman, M.D., [facility] General and Minimally invasive Surgery Breast and Colorectal Surgery Office:   [phone] Pager:   [phone]

## 2017-08-11 ENCOUNTER — Other Ambulatory Visit: Payer: Self-pay

## 2017-08-11 ENCOUNTER — Ambulatory Visit
Admission: RE | Admit: 2017-08-11 | Discharge: 2017-08-11 | Disposition: A | Discharge status: Home / Self Care | Payer: BLUE CROSS/BLUE SHIELD | Source: Ambulatory Visit | Attending: General Surgery | Admitting: General Surgery

## 2017-08-11 ENCOUNTER — Ambulatory Visit (AMBULATORY_SURGERY_CENTER): Payer: Self-pay | Admitting: *Deleted

## 2017-08-11 VITALS — Ht 65.0 in | Wt 192.0 lb

## 2017-08-11 DIAGNOSIS — N6091 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: Secondary | ICD-10-CM

## 2017-08-11 DIAGNOSIS — Z1211 Encounter for screening for malignant neoplasm of colon: Secondary | ICD-10-CM

## 2017-08-11 DIAGNOSIS — N6081 Other benign mammary dysplasias of right breast: Secondary | ICD-10-CM | POA: Diagnosis not present

## 2017-08-11 MED ORDER — PEG-KCL-NACL-NASULF-NA ASC-C 140 G PO SOLR
1.0000 | ORAL | 0 refills | Status: DC
Start: 2017-08-11 — End: 2017-09-15

## 2017-08-11 NOTE — Progress Notes (Signed)
No egg or soy allergy known to patient  No issues with past sedation with any surgeries  or procedures, no intubation problems  No diet pills per patient No home 02 use per patient  No blood thinners per patient  Pt denies issues with constipation  No A fib or A flutter  EMMI video sent to pt's e mail -  Pt has a Plenvu sample at home from a previously sch colon  Pt has a breast lumpectomy scheduled for wed 08-12-17- unsure if breast cancer- pt informed to call after surgery with results if cancer and needs further treatment- may need to cancel  7-2 colon- pt will let us know

## 2017-08-12 ENCOUNTER — Encounter (HOSPITAL_BASED_OUTPATIENT_CLINIC_OR_DEPARTMENT_OTHER): Payer: Self-pay | Admitting: Anesthesiology

## 2017-08-12 ENCOUNTER — Ambulatory Visit (HOSPITAL_BASED_OUTPATIENT_CLINIC_OR_DEPARTMENT_OTHER)
Admission: RE | Admit: 2017-08-12 | Discharge: 2017-08-12 | Disposition: A | Discharge status: Home / Self Care | Payer: BLUE CROSS/BLUE SHIELD | Source: Ambulatory Visit | Attending: General Surgery | Admitting: General Surgery

## 2017-08-12 ENCOUNTER — Other Ambulatory Visit: Payer: Self-pay

## 2017-08-12 ENCOUNTER — Ambulatory Visit (HOSPITAL_BASED_OUTPATIENT_CLINIC_OR_DEPARTMENT_OTHER): Payer: BLUE CROSS/BLUE SHIELD | Admitting: Anesthesiology

## 2017-08-12 ENCOUNTER — Ambulatory Visit
Admission: RE | Admit: 2017-08-12 | Discharge: 2017-08-12 | Disposition: A | Discharge status: Home / Self Care | Payer: BLUE CROSS/BLUE SHIELD | Source: Ambulatory Visit | Attending: General Surgery | Admitting: General Surgery

## 2017-08-12 ENCOUNTER — Encounter: Payer: BLUE CROSS/BLUE SHIELD | Admitting: Gastroenterology

## 2017-08-12 ENCOUNTER — Encounter (HOSPITAL_BASED_OUTPATIENT_CLINIC_OR_DEPARTMENT_OTHER)
Admission: RE | Disposition: A | Discharge status: Home / Self Care | Payer: Self-pay | Source: Ambulatory Visit | Attending: General Surgery

## 2017-08-12 DIAGNOSIS — N6091 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: Secondary | ICD-10-CM

## 2017-08-12 DIAGNOSIS — Z87891 Personal history of nicotine dependence: Secondary | ICD-10-CM | POA: Insufficient documentation

## 2017-08-12 DIAGNOSIS — Z794 Long term (current) use of insulin: Secondary | ICD-10-CM | POA: Insufficient documentation

## 2017-08-12 DIAGNOSIS — N6011 Diffuse cystic mastopathy of right breast: Secondary | ICD-10-CM | POA: Diagnosis not present

## 2017-08-12 DIAGNOSIS — Z7901 Long term (current) use of anticoagulants: Secondary | ICD-10-CM | POA: Diagnosis not present

## 2017-08-12 DIAGNOSIS — E119 Type 2 diabetes mellitus without complications: Secondary | ICD-10-CM | POA: Diagnosis not present

## 2017-08-12 DIAGNOSIS — N6081 Other benign mammary dysplasias of right breast: Secondary | ICD-10-CM | POA: Diagnosis not present

## 2017-08-12 DIAGNOSIS — E039 Hypothyroidism, unspecified: Secondary | ICD-10-CM | POA: Insufficient documentation

## 2017-08-12 DIAGNOSIS — G4730 Sleep apnea, unspecified: Secondary | ICD-10-CM | POA: Insufficient documentation

## 2017-08-12 DIAGNOSIS — R921 Mammographic calcification found on diagnostic imaging of breast: Secondary | ICD-10-CM | POA: Diagnosis not present

## 2017-08-12 DIAGNOSIS — J45998 Other asthma: Secondary | ICD-10-CM | POA: Diagnosis not present

## 2017-08-12 DIAGNOSIS — Z803 Family history of malignant neoplasm of breast: Secondary | ICD-10-CM | POA: Insufficient documentation

## 2017-08-12 DIAGNOSIS — N6089 Other benign mammary dysplasias of unspecified breast: Secondary | ICD-10-CM | POA: Diagnosis not present

## 2017-08-12 HISTORY — DX: 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: N60.91

## 2017-08-12 HISTORY — PX: BREAST LUMPECTOMY WITH RADIOACTIVE SEED LOCALIZATION: SHX6424

## 2017-08-12 LAB — GLUCOSE, CAPILLARY
Glucose-Capillary: 191 mg/dL — ABNORMAL HIGH (ref 65–99)
Glucose-Capillary: 157 mg/dL — ABNORMAL HIGH (ref 65–99)

## 2017-08-12 SURGERY — BREAST LUMPECTOMY WITH RADIOACTIVE SEED LOCALIZATION
Anesthesia: General | Site: Breast | Laterality: Right

## 2017-08-12 MED ORDER — TRAMADOL HCL 50 MG PO TABS: 50.0000 mg | ORAL_TABLET | Freq: Four times a day (QID) | ORAL | 1 refills | Status: DC | PRN

## 2017-08-12 MED ORDER — MIDAZOLAM HCL 2 MG/2ML IJ SOLN
INTRAMUSCULAR | Status: AC
  Filled 2017-08-12: qty 2

## 2017-08-12 MED ORDER — PROPOFOL 10 MG/ML IV BOLUS
INTRAVENOUS | Status: DC | PRN
  Administered 2017-08-12: 150 mg via INTRAVENOUS

## 2017-08-12 MED ORDER — DEXAMETHASONE SODIUM PHOSPHATE 10 MG/ML IJ SOLN
INTRAMUSCULAR | Status: AC
  Filled 2017-08-12: qty 1

## 2017-08-12 MED ORDER — CEFAZOLIN SODIUM-DEXTROSE 2-4 GM/100ML-% IV SOLN
INTRAVENOUS | Status: AC
  Filled 2017-08-12: qty 100

## 2017-08-12 MED ORDER — CHLORHEXIDINE GLUCONATE CLOTH 2 % EX PADS: 6.0000 | MEDICATED_PAD | Freq: Once | CUTANEOUS | Status: DC

## 2017-08-12 MED ORDER — EPHEDRINE SULFATE 50 MG/ML IJ SOLN
INTRAMUSCULAR | Status: DC | PRN
  Administered 2017-08-12 (×2): 25 mg via INTRAVENOUS

## 2017-08-12 MED ORDER — FENTANYL CITRATE (PF) 100 MCG/2ML IJ SOLN
INTRAMUSCULAR | Status: AC
  Filled 2017-08-12: qty 2

## 2017-08-12 MED ORDER — CEFAZOLIN SODIUM-DEXTROSE 2-4 GM/100ML-% IV SOLN
2.0000 g | INTRAVENOUS | Status: AC
  Administered 2017-08-12: 2 g via INTRAVENOUS

## 2017-08-12 MED ORDER — CELECOXIB 200 MG PO CAPS
ORAL_CAPSULE | ORAL | Status: AC
  Filled 2017-08-12: qty 1

## 2017-08-12 MED ORDER — FENTANYL CITRATE (PF) 100 MCG/2ML IJ SOLN
INTRAMUSCULAR | Status: DC | PRN
  Administered 2017-08-12: 25 ug via INTRAVENOUS
  Administered 2017-08-12: 100 ug via INTRAVENOUS

## 2017-08-12 MED ORDER — LIDOCAINE HCL (CARDIAC) PF 100 MG/5ML IV SOSY
PREFILLED_SYRINGE | INTRAVENOUS | Status: DC | PRN
  Administered 2017-08-12: 30 mg via INTRAVENOUS

## 2017-08-12 MED ORDER — PROPOFOL 10 MG/ML IV BOLUS
INTRAVENOUS | Status: AC
  Filled 2017-08-12: qty 20

## 2017-08-12 MED ORDER — MIDAZOLAM HCL 2 MG/2ML IJ SOLN: 1.0000 mg | INTRAMUSCULAR | Status: DC | PRN

## 2017-08-12 MED ORDER — ACETAMINOPHEN 500 MG PO TABS
ORAL_TABLET | ORAL | Status: AC
  Filled 2017-08-12: qty 2

## 2017-08-12 MED ORDER — GABAPENTIN 300 MG PO CAPS
300.0000 mg | ORAL_CAPSULE | ORAL | Status: AC
  Administered 2017-08-12: 300 mg via ORAL

## 2017-08-12 MED ORDER — LACTATED RINGERS IV SOLN
INTRAVENOUS | Status: DC
  Administered 2017-08-12 (×2): via INTRAVENOUS

## 2017-08-12 MED ORDER — GABAPENTIN 300 MG PO CAPS
ORAL_CAPSULE | ORAL | Status: AC
  Filled 2017-08-12: qty 1

## 2017-08-12 MED ORDER — ONDANSETRON HCL 4 MG/2ML IJ SOLN
INTRAMUSCULAR | Status: AC
  Filled 2017-08-12: qty 2

## 2017-08-12 MED ORDER — MIDAZOLAM HCL 5 MG/5ML IJ SOLN
INTRAMUSCULAR | Status: DC | PRN
  Administered 2017-08-12: 2 mg via INTRAVENOUS

## 2017-08-12 MED ORDER — ACETAMINOPHEN 500 MG PO TABS
1000.0000 mg | ORAL_TABLET | ORAL | Status: AC
  Administered 2017-08-12: 1000 mg via ORAL

## 2017-08-12 MED ORDER — BUPIVACAINE-EPINEPHRINE (PF) 0.5% -1:200000 IJ SOLN
INTRAMUSCULAR | Status: DC | PRN
  Administered 2017-08-12: 10 mL

## 2017-08-12 MED ORDER — LIDOCAINE HCL (CARDIAC) PF 100 MG/5ML IV SOSY
PREFILLED_SYRINGE | INTRAVENOUS | Status: AC
  Filled 2017-08-12: qty 5

## 2017-08-12 MED ORDER — CELECOXIB 200 MG PO CAPS
200.0000 mg | ORAL_CAPSULE | ORAL | Status: AC
  Administered 2017-08-12: 200 mg via ORAL

## 2017-08-12 MED ORDER — FENTANYL CITRATE (PF) 100 MCG/2ML IJ SOLN: 50.0000 ug | INTRAMUSCULAR | Status: DC | PRN

## 2017-08-12 MED ORDER — ONDANSETRON HCL 4 MG/2ML IJ SOLN
INTRAMUSCULAR | Status: DC | PRN
  Administered 2017-08-12: 4 mg via INTRAVENOUS

## 2017-08-12 MED ORDER — DEXAMETHASONE SODIUM PHOSPHATE 4 MG/ML IJ SOLN
INTRAMUSCULAR | Status: DC | PRN
  Administered 2017-08-12: 10 mg via INTRAVENOUS

## 2017-08-12 MED ORDER — SCOPOLAMINE 1 MG/3DAYS TD PT72: 1.0000 | MEDICATED_PATCH | Freq: Once | TRANSDERMAL | Status: DC | PRN

## 2017-08-12 SURGICAL SUPPLY — 60 items
APPLIER CLIP 9.375 MED OPEN (MISCELLANEOUS)
BENZOIN TINCTURE PRP APPL 2/3 (GAUZE/BANDAGES/DRESSINGS) IMPLANT
BINDER BREAST LRG (GAUZE/BANDAGES/DRESSINGS) IMPLANT
BINDER BREAST MEDIUM (GAUZE/BANDAGES/DRESSINGS) IMPLANT
BINDER BREAST XLRG (GAUZE/BANDAGES/DRESSINGS) ×2 IMPLANT
BINDER BREAST XXLRG (GAUZE/BANDAGES/DRESSINGS) IMPLANT
BLADE HEX COATED 2.75 (ELECTRODE) ×2 IMPLANT
BLADE SURG 10 STRL SS (BLADE) IMPLANT
BLADE SURG 15 STRL LF DISP TIS (BLADE) ×1 IMPLANT
BLADE SURG 15 STRL SS (BLADE) ×1
CANISTER SUC SOCK COL 7IN (MISCELLANEOUS) IMPLANT
CANISTER SUCT 1200ML W/VALVE (MISCELLANEOUS) ×2 IMPLANT
CHLORAPREP W/TINT 26ML (MISCELLANEOUS) ×2 IMPLANT
CLIP APPLIE 9.375 MED OPEN (MISCELLANEOUS) IMPLANT
COVER BACK TABLE 60X90IN (DRAPES) ×2 IMPLANT
COVER MAYO STAND STRL (DRAPES) ×2 IMPLANT
COVER PROBE W GEL 5X96 (DRAPES) ×2 IMPLANT
DECANTER SPIKE VIAL GLASS SM (MISCELLANEOUS) IMPLANT
DERMABOND ADVANCED (GAUZE/BANDAGES/DRESSINGS) ×1
DERMABOND ADVANCED .7 DNX12 (GAUZE/BANDAGES/DRESSINGS) ×1 IMPLANT
DEVICE DUBIN W/COMP PLATE 8390 (MISCELLANEOUS) ×2 IMPLANT
DRAPE LAPAROSCOPIC ABDOMINAL (DRAPES) ×2 IMPLANT
DRAPE UTILITY XL STRL (DRAPES) ×2 IMPLANT
DRSG PAD ABDOMINAL 8X10 ST (GAUZE/BANDAGES/DRESSINGS) ×2 IMPLANT
ELECT REM PT RETURN 9FT ADLT (ELECTROSURGICAL) ×2
ELECTRODE REM PT RTRN 9FT ADLT (ELECTROSURGICAL) ×1 IMPLANT
GAUZE SPONGE 4X4 12PLY STRL LF (GAUZE/BANDAGES/DRESSINGS) ×2 IMPLANT
GLOVE BIO SURGEON STRL SZ 6.5 (GLOVE) ×2 IMPLANT
GLOVE BIOGEL PI IND STRL 7.0 (GLOVE) ×1 IMPLANT
GLOVE BIOGEL PI INDICATOR 7.0 (GLOVE) ×1
GLOVE EUDERMIC 7 POWDERFREE (GLOVE) ×4 IMPLANT
GOWN STRL REUS W/ TWL LRG LVL3 (GOWN DISPOSABLE) ×1 IMPLANT
GOWN STRL REUS W/ TWL XL LVL3 (GOWN DISPOSABLE) ×1 IMPLANT
GOWN STRL REUS W/TWL LRG LVL3 (GOWN DISPOSABLE) ×1
GOWN STRL REUS W/TWL XL LVL3 (GOWN DISPOSABLE) ×1
ILLUMINATOR WAVEGUIDE N/F (MISCELLANEOUS) IMPLANT
KIT MARKER MARGIN INK (KITS) ×2 IMPLANT
LIGHT WAVEGUIDE WIDE FLAT (MISCELLANEOUS) IMPLANT
NEEDLE HYPO 25X1 1.5 SAFETY (NEEDLE) ×2 IMPLANT
NS IRRIG 1000ML POUR BTL (IV SOLUTION) ×2 IMPLANT
PACK BASIN DAY SURGERY FS (CUSTOM PROCEDURE TRAY) ×2 IMPLANT
PENCIL BUTTON HOLSTER BLD 10FT (ELECTRODE) ×2 IMPLANT
SHEET MEDIUM DRAPE 40X70 STRL (DRAPES) IMPLANT
SLEEVE SCD COMPRESS KNEE MED (MISCELLANEOUS) ×2 IMPLANT
SPONGE LAP 18X18 RF (DISPOSABLE) IMPLANT
SPONGE LAP 4X18 RFD (DISPOSABLE) ×2 IMPLANT
STRIP CLOSURE SKIN 1/2X4 (GAUZE/BANDAGES/DRESSINGS) IMPLANT
SUT ETHILON 3 0 FSL (SUTURE) IMPLANT
SUT MNCRL AB 4-0 PS2 18 (SUTURE) ×2 IMPLANT
SUT SILK 2 0 SH (SUTURE) ×2 IMPLANT
SUT VIC AB 2-0 CT1 27 (SUTURE)
SUT VIC AB 2-0 CT1 TAPERPNT 27 (SUTURE) IMPLANT
SUT VIC AB 3-0 SH 27 (SUTURE)
SUT VIC AB 3-0 SH 27X BRD (SUTURE) IMPLANT
SUT VICRYL 3-0 CR8 SH (SUTURE) ×2 IMPLANT
SYR 10ML LL (SYRINGE) ×2 IMPLANT
TOWEL GREEN STERILE FF (TOWEL DISPOSABLE) ×2 IMPLANT
TOWEL OR NON WOVEN STRL DISP B (DISPOSABLE) IMPLANT
TUBE CONNECTING 20X1/4 (TUBING) ×2 IMPLANT
YANKAUER SUCT BULB TIP NO VENT (SUCTIONS) ×2 IMPLANT

## 2017-08-12 NOTE — Anesthesia Postprocedure Evaluation (Signed)
Anesthesia Post Note  Patient: Chloe Flis McDill  Procedure(s) Performed: RIGHT BREAST LUMPECTOMY X'S 2 WITH RADIOACTIVE SEED LOCALIZATION X'S 2 (Right Breast)     Patient location during evaluation: PACU Anesthesia Type: General Level of consciousness: awake and alert Pain management: pain level controlled Vital Signs Assessment: post-procedure vital signs reviewed and stable Respiratory status: spontaneous breathing, nonlabored ventilation, respiratory function stable and patient connected to nasal cannula oxygen Cardiovascular status: blood pressure returned to baseline and stable Postop Assessment: no apparent nausea or vomiting Anesthetic complications: no    Last Vitals:  Vitals:   08/12/17 1130 08/12/17 1220  BP: 130/67 137/64  Pulse: 76 76  Resp: (!) 9 16  Temp:  36.5 C  SpO2: 98% 98%    Last Pain:  Vitals:   08/12/17 1220  TempSrc:   PainSc: 0-No pain                 Evaleen Sant P Adelia Baptista

## 2017-08-12 NOTE — Op Note (Signed)
Patient Name:           Molly Wall   Date of Surgery:        08/12/2017  Pre op Diagnosis:      Atypical ductal hyperplasia right breast, multifocal  Post op Diagnosis:    Same  Procedure:                 Right breast lumpectomy x2 with radioactive seed localization x2  Surgeon:                     Edsel Petrin. Dalbert Batman, M.D., FACS  Assistant:                      OR staff  Operative Indications:   This is a very pleasant 72 year old woman, referred by Dr. 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 Her PCP is Alma Friendly, nurse practitioner.      She has no prior history of breast problems. Annual screening mammograms are done and recent mammograms show suspicious areas of calcification in the right breast. One area is anterior, slightly medial retroareolar. A second area on the right is in the upper inner quadrant middle depth. Both of these areas were biopsied and both show atypical ductal hyperplasia. Family history reveals sister died of metastatic breast cancer at age 7. The patient worries about this.      I  discussed the fact that ADH is a high risk lesion carrying somewhere between 15 and 18% risk of cancer. She is also advised that this increases her lifetime risk for breast cancer as does her sister's history of metastatic breast cancer. She is well aware of this and is ready to have these areas biopsied.      She'll be scheduled for right breast lumpectomy 2 with radioactive seed localization 2. It is not clear whether this will be 1 or 2 incisions. . She agrees with this plan      If this all turns out to be simply ADH, I told her I would offer to refer her to the high risk breast clinic for risk assessment and possible genetic counseling.    Operative Findings:       I found both radioactive seeds.  One was in the deep retroareolar area at the inferior areolar margin.  A second area was in the upper  inner quadrant but was relatively centrally located.  There was no palpable abnormality.  I cannot tell whether these 2 areas were contiguous ADH or separate.  There are 2 circumareolar incisions, one superiorly and one inferiorly.  I had good skin bridges between the 2 incisions and the skin looked pink and healthy at the end of the case  Procedure in Detail:          Following the induction of general LMA anesthesia the patient's right breast was prepped and draped in a sterile fashion.  Surgical timeout was performed.  Intravenous antibiotics were given.  0.5% Marcaine with epinephrine was used as a local infiltration anesthetic.     Using the neoprobe I identified and mapped out the areas of the 2 separate radioactive seeds.  A curvilinear incision was made inferiorly at the areolar margin and a separate curvilinear incision was made superiorly at the areolar margin.  Both lumpectomies were performed using the neoprobe and electrocautery.  Both specimens were removed through the separate incisions, marked with silk  sutures and a 6 color ink kit.  Both specimen mammograms looked good showing the marker clips and the seeds to be contained.        The specimens were then sent separately.  One specimen was labeled inferior subareolar.  The second specimen was labeled upper inner quadrant.  These seeds were both identified and retrieved in the lab.     The wounds were irrigated .  Hemostasis was excellent.  A few metal marker clips were placed in the lumpectomy cavities.  The breast tissues were loosely approximated with 3-0 Vicryl sutures and the skin incisions closed with running subcuticular 4-0 Monocryl and Dermabond.  Clean bandages were placed and the patient tolerated the procedure well was taken to PACU in stable condition.  EBL 15 cc.  Counts correct.  Complications none.    Addendum: I logged onto the [employer] and reviewed her prescription medication history     Shadia Larose M. Dalbert Batman, M.D.,  FACS General and Minimally Invasive Surgery Breast and Colorectal Surgery  08/12/2017 10:54 AM

## 2017-08-12 NOTE — Anesthesia Procedure Notes (Signed)
Procedure Name: LMA Insertion Date/Time: 08/12/2017 9:59 AM Performed by: Marrianne Mood, CRNA Pre-anesthesia Checklist: Patient identified, Emergency Drugs available, Suction available, Patient being monitored and Timeout performed Patient Re-evaluated:Patient Re-evaluated prior to induction Oxygen Delivery Method: Circle system utilized Preoxygenation: Pre-oxygenation with 100% oxygen Induction Type: IV induction Ventilation: Mask ventilation without difficulty LMA: LMA inserted LMA Size: 4.0 Number of attempts: 1 Airway Equipment and Method: Bite block Placement Confirmation: positive ETCO2 Tube secured with: Tape Dental Injury: Teeth and Oropharynx as per pre-operative assessment

## 2017-08-12 NOTE — Interval H&P Note (Signed)
History and Physical Interval Note:  08/12/2017 8:01 AM  Molly Wall  has presented today for surgery, with the diagnosis of RIGHT BREAST ATYPICAL DUCTAL HYPERPLASIA  The various methods of treatment have been discussed with the patient and family. After consideration of risks, benefits and other options for treatment, the patient has consented to  Procedure(s): RIGHT BREAST LUMPECTOMY X'S 2 WITH RADIOACTIVE SEED LOCALIZATION X'S 2 (Right) as a surgical intervention .  The patient's history has been reviewed, patient examined, no change in status, stable for surgery.  I have reviewed the patient's chart and labs.  Questions were answered to the patient's satisfaction.     Adin Hector

## 2017-08-12 NOTE — Transfer of Care (Signed)
Immediate Anesthesia Transfer of Care Note  Patient: Molly Wall  Procedure(s) Performed: RIGHT BREAST LUMPECTOMY X'S 2 WITH RADIOACTIVE SEED LOCALIZATION X'S 2 (Right Breast)  Patient Location: PACU  Anesthesia Type:General  Level of Consciousness: awake, alert  and oriented  Airway & Oxygen Therapy: Patient Spontanous Breathing and Patient connected to face mask oxygen  Post-op Assessment: Report given to RN and Post -op Vital signs reviewed and stable  Post vital signs: Reviewed and stable  Last Vitals:  Vitals Value Taken Time  BP 117/65 08/12/2017 10:55 AM  Temp    Pulse 84 08/12/2017 10:57 AM  Resp 12 08/12/2017 10:57 AM  SpO2 100 % 08/12/2017 10:57 AM  Vitals shown include unvalidated device data.  Last Pain:  Vitals:   08/12/17 0839  TempSrc: Oral  PainSc: 0-No pain         Complications: No apparent anesthesia complications

## 2017-08-12 NOTE — Anesthesia Preprocedure Evaluation (Addendum)
Anesthesia Evaluation  Patient identified by MRN, date of birth, ID band Patient awake    Reviewed: Allergy & Precautions, NPO status , Patient's Chart, lab work & pertinent test results  Airway Mallampati: II  TM Distance: >3 FB Neck ROM: Full    Dental no notable dental hx.    Pulmonary asthma , sleep apnea , former smoker,    Pulmonary exam normal breath sounds clear to auscultation       Cardiovascular negative cardio ROS Normal cardiovascular exam Rhythm:Regular Rate:Normal  ECG: NSR, rate 70   Neuro/Psych negative neurological ROS  negative psych ROS   GI/Hepatic negative GI ROS, Neg liver ROS,   Endo/Other  diabetes, Insulin Dependent, Oral Hypoglycemic AgentsHypothyroidism   Renal/GU negative Renal ROS     Musculoskeletal Lumbar herniated disc   Abdominal (+) + obese,   Peds  Hematology HLD   Anesthesia Other Findings RIGHT BREAST ATYPICAL DUCTAL HYPERPLASIA  Reproductive/Obstetrics                            Anesthesia Physical Anesthesia Plan  ASA: III  Anesthesia Plan: General   Post-op Pain Management:    Induction: Intravenous  PONV Risk Score and Plan: 3 and Midazolam, Dexamethasone, Ondansetron and Treatment may vary due to age or medical condition  Airway Management Planned: LMA  Additional Equipment:   Intra-op Plan:   Post-operative Plan: Extubation in OR  Informed Consent: I have reviewed the patients History and Physical, chart, labs and discussed the procedure including the risks, benefits and alternatives for the proposed anesthesia with the patient or authorized representative who has indicated his/her understanding and acceptance.   Dental advisory given  Plan Discussed with: CRNA  Anesthesia Plan Comments:         Anesthesia Quick Evaluation

## 2017-08-12 NOTE — Discharge Instructions (Signed)
[facility] °Office Phone Number [phone] ° °BREAST BIOPSY/ PARTIAL MASTECTOMY: POST OP INSTRUCTIONS ° °Always review your discharge instruction sheet given to you by the facility where your surgery was performed. ° °IF YOU HAVE DISABILITY OR FAMILY LEAVE FORMS, YOU MUST BRING THEM TO THE OFFICE FOR PROCESSING.  DO NOT GIVE THEM TO YOUR DOCTOR. ° °1. A prescription for pain medication may be given to you upon discharge.  Take your pain medication as prescribed, if needed.  If narcotic pain medicine is not needed, then you may take acetaminophen (Tylenol) or ibuprofen (Advil) as needed. °2. Take your usually prescribed medications unless otherwise directed °3. If you need a refill on your pain medication, please contact your pharmacy.  They will contact our office to request authorization.  Prescriptions will not be filled after 5pm or on week-ends. °4. You should eat very light the first 24 hours after surgery, such as soup, crackers, pudding, etc.  Resume your normal diet the day after surgery. °5. Most patients will experience some swelling and bruising in the breast.  Ice packs and a good support bra will help.  Swelling and bruising can take several days to resolve.  °6. It is common to experience some constipation if taking pain medication after surgery.  Increasing fluid intake and taking a stool softener will usually help or prevent this problem from occurring.  A mild laxative (Milk of Magnesia or Miralax) should be taken according to package directions if there are no bowel movements after 48 hours. °7. Unless discharge instructions indicate otherwise, you may remove your bandages 24-48 hours after surgery, and you may shower at that time.  You may have steri-strips (small skin tapes) in place directly over the incision.  These strips should be left on the skin for 7-10 days.  If your surgeon used skin glue on the incision, you may shower in 24 hours.  The glue will flake off over the  next 2-3 weeks.  Any sutures or staples will be removed at the office during your follow-up visit. °8. ACTIVITIES:  You may resume regular daily activities (gradually increasing) beginning the next day.  Wearing a good support bra or sports bra minimizes pain and swelling.  You may have sexual intercourse when it is comfortable. °a. You may drive when you no longer are taking prescription pain medication, you can comfortably wear a seatbelt, and you can safely maneuver your car and apply brakes. °b. RETURN TO WORK:  ______________________________________________________________________________________ °9. You should see your doctor in the office for a follow-up appointment approximately two weeks after your surgery.  Your doctor’s nurse will typically make your follow-up appointment when she calls you with your pathology report.  Expect your pathology report 2-3 business days after your surgery.  You may call to check if you do not hear from us after three days. °10. OTHER INSTRUCTIONS: _______________________________________________________________________________________________ _____________________________________________________________________________________________________________________________________ °_____________________________________________________________________________________________________________________________________ °_____________________________________________________________________________________________________________________________________ ° °WHEN TO CALL YOUR DOCTOR: °1. Fever over 101.0 °2. Nausea and/or vomiting. °3. Extreme swelling or bruising. °4. Continued bleeding from incision. °5. Increased pain, redness, or drainage from the incision. ° °The clinic staff is available to answer your questions during regular business hours.  Please don’t hesitate to call and ask to speak to one of the nurses for clinical concerns.  If you have a medical emergency, go to the nearest  emergency room or call 911.  A surgeon from [facility] is always on call at the hospital. ° °For further questions, please visit [email or web address]  ° ° ° ° °  Post Anesthesia Home Care Instructions ° °Activity: °Get plenty of rest for the remainder of the day. A responsible individual must stay with you for 24 hours following the procedure.  °For the next 24 hours, DO NOT: °-Drive a car °-Operate machinery °-Drink alcoholic beverages °-Take any medication unless instructed by your physician °-Make any legal decisions or sign important papers. ° °Meals: °Start with liquid foods such as gelatin or soup. Progress to regular foods as tolerated. Avoid greasy, spicy, heavy foods. If nausea and/or vomiting occur, drink only clear liquids until the nausea and/or vomiting subsides. Call your physician if vomiting continues. ° °Special Instructions/Symptoms: °Your throat may feel dry or sore from the anesthesia or the breathing tube placed in your throat during surgery. If this causes discomfort, gargle with warm salt water. The discomfort should disappear within 24 hours. ° °If you had a scopolamine patch placed behind your ear for the management of post- operative nausea and/or vomiting: ° °1. The medication in the patch is effective for 72 hours, after which it should be removed.  Wrap patch in a tissue and discard in the trash. Wash hands thoroughly with soap and water. °2. You may remove the patch earlier than 72 hours if you experience unpleasant side effects which may include dry mouth, dizziness or visual disturbances. °3. Avoid touching the patch. Wash your hands with soap and water after contact with the patch. °  ° °

## 2017-08-13 ENCOUNTER — Encounter (HOSPITAL_BASED_OUTPATIENT_CLINIC_OR_DEPARTMENT_OTHER): Payer: Self-pay | Admitting: General Surgery

## 2017-08-17 NOTE — Progress Notes (Signed)
Inform patient of Pathology report,. Both biopsy areas show no cancer. Just biopsy changes and fibrocystic changes.  I will discuss in detail at next office visit. Let me know you reached her. Thanks.  Dalbert Batman

## 2017-09-14 ENCOUNTER — Telehealth: Payer: Self-pay | Admitting: Oncology

## 2017-09-14 ENCOUNTER — Telehealth: Payer: Self-pay | Admitting: Gastroenterology

## 2017-09-14 ENCOUNTER — Encounter: Payer: Self-pay | Admitting: Genetic Counselor

## 2017-09-14 ENCOUNTER — Encounter: Payer: Self-pay | Admitting: Oncology

## 2017-09-14 NOTE — Telephone Encounter (Signed)
Pt did not remember what medications to take regarding her insulin. Advised do not take any medications the day of her procedure. Advised we will check blood sugar in the morning before and after her procedure.

## 2017-09-14 NOTE — Telephone Encounter (Signed)
New referral received from Dr. Dalbert Batman at [facility] for the high risk clinic. Pt has been scheduled to see Dr. Jana Hakim on 7/21 at 4pm and genetic counseling with Roma Kayser on 8/21 at 1pm. Letter mailed to the pt.

## 2017-09-15 ENCOUNTER — Ambulatory Visit (AMBULATORY_SURGERY_CENTER): Payer: BLUE CROSS/BLUE SHIELD | Admitting: Gastroenterology

## 2017-09-15 ENCOUNTER — Other Ambulatory Visit: Payer: Self-pay

## 2017-09-15 ENCOUNTER — Encounter: Payer: Self-pay | Admitting: Gastroenterology

## 2017-09-15 VITALS — BP 126/71 | HR 71 | Temp 97.8°F | Resp 16 | Ht 65.0 in | Wt 192.0 lb

## 2017-09-15 DIAGNOSIS — D122 Benign neoplasm of ascending colon: Secondary | ICD-10-CM | POA: Diagnosis not present

## 2017-09-15 DIAGNOSIS — Z1211 Encounter for screening for malignant neoplasm of colon: Secondary | ICD-10-CM | POA: Diagnosis present

## 2017-09-15 MED ORDER — SODIUM CHLORIDE 0.9 % IV SOLN: 500.0000 mL | Freq: Once | INTRAVENOUS | Status: DC

## 2017-09-15 NOTE — Patient Instructions (Signed)
HANDOUTS GIVEN FOR DIVERTICULOSIS AND POLYPS  YOU HAD AN ENDOSCOPIC PROCEDURE TODAY AT [facility]:   Refer to the procedure report that was given to you for any specific questions about what was found during the examination.  If the procedure report does not answer your questions, please call your gastroenterologist to clarify.  If you requested that your care partner not be given the details of your procedure findings, then the procedure report has been included in a sealed envelope for you to review at your convenience later.  YOU SHOULD EXPECT: Some feelings of bloating in the abdomen. Passage of more gas than usual.  Walking can help get rid of the air that was put into your GI tract during the procedure and reduce the bloating. If you had a lower endoscopy (such as a colonoscopy or flexible sigmoidoscopy) you may notice spotting of blood in your stool or on the toilet paper. If you underwent a bowel prep for your procedure, you may not have a normal bowel movement for a few days.  Please Note:  You might notice some irritation and congestion in your nose or some drainage.  This is from the oxygen used during your procedure.  There is no need for concern and it should clear up in a day or so.  SYMPTOMS TO REPORT IMMEDIATELY:   Following lower endoscopy (colonoscopy or flexible sigmoidoscopy):  Excessive amounts of blood in the stool  Significant tenderness or worsening of abdominal pains  Swelling of the abdomen that is new, acute  Fever of 100F or higher   For urgent or emergent issues, a gastroenterologist can be reached at any hour by calling [phone].   DIET:  We do recommend a small meal at first, but then you may proceed to your regular diet.  Drink plenty of fluids but you should avoid alcoholic beverages for 24 hours.  ACTIVITY:  You should plan to take it easy for the rest of today and you should NOT DRIVE or use heavy machinery until tomorrow (because of  the sedation medicines used during the test).    FOLLOW UP: Our staff will call the number listed on your records the next business day following your procedure to check on you and address any questions or concerns that you may have regarding the information given to you following your procedure. If we do not reach you, we will leave a message.  However, if you are feeling well and you are not experiencing any problems, there is no need to return our call.  We will assume that you have returned to your regular daily activities without incident.  If any biopsies were taken you will be contacted by phone or by letter within the next 1-3 weeks.  Please call us at [phone] if you have not heard about the biopsies in 3 weeks.    SIGNATURES/CONFIDENTIALITY: You and/or your care partner have signed paperwork which will be entered into your electronic medical record.  These signatures attest to the fact that that the information above on your After Visit Summary has been reviewed and is understood.  Full responsibility of the confidentiality of this discharge information lies with you and/or your care-partner.

## 2017-09-15 NOTE — Progress Notes (Signed)
To PACU, VSS. Report to [email or web address] 

## 2017-09-15 NOTE — Op Note (Signed)
[facility] Patient Name: Molly Wall Procedure Date: 09/15/2017 8:42 AM MRN: [id number] Endoscopist: [place]. Loletha Carrow , MD Age: 72 Referring MD:  Date of Birth: June 14, 1945 Gender: Female Account #: [id number] Procedure:                Colonoscopy Indications:              Screening for colorectal malignant neoplasm                            (reportedly no polyps 11 years ago on colon[place]                            [place]) Medicines:                Monitored Anesthesia Care Procedure:                Pre-Anesthesia Assessment:                           - Prior to the procedure, a History and Physical                            was performed, and patient medications and                            allergies were reviewed. The patient's tolerance of                            previous anesthesia was also reviewed. The risks                            and benefits of the procedure and the sedation                            options and risks were discussed with the patient.                            All questions were answered, and informed consent                            was obtained. Prior Anticoagulants: The patient has                            taken no previous anticoagulant or antiplatelet                            agents. ASA Grade Assessment: II - A patient with                            mild systemic disease. After reviewing the risks                            and benefits, the patient was deemed in  satisfactory condition to undergo the procedure.                           After obtaining informed consent, the colonoscope                            was passed under direct vision. Throughout the                            procedure, the patient's blood pressure, pulse, and                            oxygen saturations were monitored continuously. The                            Colonoscope was introduced through the anus and                        advanced to the the cecum, identified by                            appendiceal orifice and ileocecal valve. The                            colonoscopy was performed without difficulty. The                            patient tolerated the procedure well. The quality                            of the bowel preparation was good. The ileocecal                            valve, appendiceal orifice, and rectum were                            photographed. The quality of the bowel preparation                            was evaluated using the BBPS [facility] Bowel                            Preparation Scale) with scores of: Right Colon = 2,                            Transverse Colon = 2 and Left Colon = 2. The total                            BBPS score equals 6. Scope In: 8:46:20 AM Scope Out: 8:59:38 AM Scope Withdrawal Time: 0 hours 8 minutes 54 seconds  Total Procedure Duration: 0 hours 13 minutes 18 seconds  Findings:                 The perianal and digital rectal examinations were  normal.                           Many small and large-mouthed diverticula were found                            in the left colon.                           A 4 mm polyp was found in the proximal ascending                            colon. The polyp was sessile. The polyp was removed                            with a cold snare. Resection and retrieval were                            complete.                           The exam was otherwise without abnormality on                            direct and retroflexion views. Complications:            No immediate complications. Estimated Blood Loss:     Estimated blood loss was minimal. Impression:               - Diverticulosis in the left colon.                           - One 4 mm polyp in the proximal ascending colon,                            removed with a cold snare. Resected and retrieved.                            - The examination was otherwise normal on direct                            and retroflexion views. Recommendation:           - Patient has a contact number available for                            emergencies. The signs and symptoms of potential                            delayed complications were discussed with the                            patient. Return to normal activities tomorrow.                            Written discharge instructions were provided to  the                            patient.                           - Resume previous diet.                           - Continue present medications.                           - Await pathology results.                           - Repeat colonoscopy is recommended for                            surveillance. The colonoscopy date will be                            determined after pathology results from today's                            exam become available for review. Leanord Thibeau L. Loletha Carrow, MD 09/15/2017 9:05:16 AM This report has been signed electronically.

## 2017-09-15 NOTE — Progress Notes (Signed)
Called to room to assist during endoscopic procedure.  Patient ID and intended procedure confirmed with present staff. Received instructions for my participation in the procedure from the performing physician.  

## 2017-09-16 ENCOUNTER — Telehealth: Payer: Self-pay | Admitting: *Deleted

## 2017-09-16 NOTE — Telephone Encounter (Signed)
  Follow up Call-  Call back number 09/15/2017  Post procedure Call Back phone  # [phone]  Permission to leave phone message Yes  Some recent data might be hidden     Patient questions:  Do you have a fever, pain , or abdominal swelling? No. Pain Score  0 *  Have you tolerated food without any problems? Yes.    Have you been able to return to your normal activities? Yes.    Do you have any questions about your discharge instructions: Diet   No. Medications  No. Follow up visit  No.  Do you have questions or concerns about your Care? No.  Actions: * If pain score is 4 or above: No action needed, pain <4.

## 2017-09-21 ENCOUNTER — Encounter: Payer: Self-pay | Admitting: Gastroenterology

## 2017-09-24 ENCOUNTER — Other Ambulatory Visit: Payer: Self-pay | Admitting: Primary Care

## 2017-09-24 DIAGNOSIS — E039 Hypothyroidism, unspecified: Secondary | ICD-10-CM

## 2017-10-06 ENCOUNTER — Encounter: Payer: Self-pay | Admitting: Internal Medicine

## 2017-10-06 ENCOUNTER — Ambulatory Visit (INDEPENDENT_AMBULATORY_CARE_PROVIDER_SITE_OTHER): Payer: BLUE CROSS/BLUE SHIELD | Admitting: Internal Medicine

## 2017-10-06 DIAGNOSIS — J4520 Mild intermittent asthma, uncomplicated: Secondary | ICD-10-CM

## 2017-10-06 MED ORDER — BENZONATATE 200 MG PO CAPS: 200.0000 mg | ORAL_CAPSULE | Freq: Three times a day (TID) | ORAL | 0 refills | Status: DC | PRN

## 2017-10-06 NOTE — Assessment & Plan Note (Signed)
No clear flare today. Advised to take advair regularly for the next 1-2 weeks. Continue singulair, flonase and add zyrtec. Call back if no improvement by Thurs/Fri this week and if needed will call in antibiotic. No wheezing or SOB so no indication for steroids and has concurrent diabetes on insulin so would like to avoid.

## 2017-10-06 NOTE — Patient Instructions (Signed)
We have sent in tessalon perles to use up to 3 times per day for cough.  You can add zyrtec (cetirizine) daily to help with drainage as well.   If you are not feeling better by Thursday or Friday call us back and we will send in an antibiotic.

## 2017-10-06 NOTE — Progress Notes (Signed)
   Subjective:    Patient ID: Molly Wall, female    DOB: 1945-06-20, 72 y.o.   MRN: [id number]  HPI The patient is a 72 YO female coming in for cough for about 3 days. She has been worn down lately with caring for mother for 6 weeks (she passed last week). She was around a family member who was sick and coughing last week at funeral. She started coughing Friday or so, had some chills over the weekend. Denies SOB. Some yellow sputum. Also some nasal drainage. She has asthma but normally just takes singulair. Has advair which she used last night which was the first time she used it in a long time. She has albuterol inhaler and has not needed that. Also has used flonase for the last 2 days or so. Overall is improving gradually today. Denies ear pain. Some sinus pressure.   Review of Systems  Constitutional: Positive for activity change and chills. Negative for appetite change, fatigue, fever and unexpected weight change.  HENT: Positive for congestion, postnasal drip, rhinorrhea and sinus pressure. Negative for ear discharge, ear pain, sinus pain, sneezing, sore throat, tinnitus, trouble swallowing and voice change.   Eyes: Negative.   Respiratory: Positive for cough. Negative for chest tightness, shortness of breath and wheezing.   Cardiovascular: Negative.   Gastrointestinal: Negative.   Musculoskeletal: Negative.   Skin: Negative.   Neurological: Negative.       Objective:   Physical Exam  Constitutional: She is oriented to person, place, and time. She appears well-developed and well-nourished.  HENT:  Head: Normocephalic and atraumatic.  Oropharynx with redness and clear drainage, nose with swollen turbinates, TMs normal bilaterally  Eyes: EOM are normal.  Neck: Normal range of motion. No thyromegaly present.  Cardiovascular: Normal rate and regular rhythm.  Pulmonary/Chest: Effort normal and breath sounds normal. No respiratory distress. She has no wheezes. She has no rales.    Abdominal: Soft.  Musculoskeletal: She exhibits no tenderness.  Lymphadenopathy:    She has no cervical adenopathy.  Neurological: She is alert and oriented to person, place, and time.  Skin: Skin is warm and dry.   Vitals:   10/06/17 1041  BP: 102/70  Pulse: 81  Temp: 97.8 F (36.6 C)  TempSrc: Oral  SpO2: 97%  Weight: 192 lb (87.1 kg)  Height: 5\' 5"  (1.651 m)      Assessment & Plan:

## 2017-10-07 ENCOUNTER — Inpatient Hospital Stay: Payer: BLUE CROSS/BLUE SHIELD | Admitting: Oncology

## 2017-10-07 ENCOUNTER — Telehealth: Payer: Self-pay | Admitting: Oncology

## 2017-10-07 NOTE — Telephone Encounter (Signed)
Called to reschedule per MD

## 2017-11-04 ENCOUNTER — Encounter: Payer: Self-pay | Admitting: Genetic Counselor

## 2017-11-04 ENCOUNTER — Inpatient Hospital Stay: Payer: BLUE CROSS/BLUE SHIELD | Attending: Genetic Counselor | Admitting: Genetic Counselor

## 2017-11-04 ENCOUNTER — Inpatient Hospital Stay: Payer: BLUE CROSS/BLUE SHIELD

## 2017-11-04 DIAGNOSIS — Z79899 Other long term (current) drug therapy: Secondary | ICD-10-CM | POA: Insufficient documentation

## 2017-11-04 DIAGNOSIS — E039 Hypothyroidism, unspecified: Secondary | ICD-10-CM | POA: Insufficient documentation

## 2017-11-04 DIAGNOSIS — Z1379 Encounter for other screening for genetic and chromosomal anomalies: Secondary | ICD-10-CM

## 2017-11-04 DIAGNOSIS — Z803 Family history of malignant neoplasm of breast: Secondary | ICD-10-CM | POA: Insufficient documentation

## 2017-11-04 DIAGNOSIS — Z87891 Personal history of nicotine dependence: Secondary | ICD-10-CM | POA: Insufficient documentation

## 2017-11-04 DIAGNOSIS — Z794 Long term (current) use of insulin: Secondary | ICD-10-CM | POA: Insufficient documentation

## 2017-11-04 DIAGNOSIS — N6091 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: Secondary | ICD-10-CM

## 2017-11-04 DIAGNOSIS — E119 Type 2 diabetes mellitus without complications: Secondary | ICD-10-CM | POA: Insufficient documentation

## 2017-11-04 DIAGNOSIS — F4320 Adjustment disorder, unspecified: Secondary | ICD-10-CM | POA: Insufficient documentation

## 2017-11-04 DIAGNOSIS — Z800 Family history of malignant neoplasm of digestive organs: Secondary | ICD-10-CM | POA: Insufficient documentation

## 2017-11-04 DIAGNOSIS — E7800 Pure hypercholesterolemia, unspecified: Secondary | ICD-10-CM | POA: Insufficient documentation

## 2017-11-04 NOTE — Progress Notes (Signed)
REFERRING PROVIDER: Fanny Skates, MD [address] [place], [place] [address]  PRIMARY PROVIDER:  [name], NP  PRIMARY REASON FOR VISIT:  1. Atypical ductal hyperplasia of right breast   2. Family history of breast cancer   3. Family history of colon cancer      HISTORY OF PRESENT ILLNESS:   [name], a 72 y.o. female, was seen for a [facility] cancer genetics consultation at the request of Dr. Dalbert Batman due to a family history of cancer.  [name] presents to clinic today to discuss the possibility of a hereditary predisposition to cancer, genetic testing, and to further clarify her future cancer risks, as well as potential cancer risks for family members.   [name] is a 72 y.o. female with no personal history of cancer.  She was diagnosed with atypical ductal hyperplasia in her right breast and was determined to be at high risk for breast cancer. She was referred to Dr. Jana Hakim to be seen in the high risk clinic.  CANCER HISTORY:   No history exists.     HORMONAL RISK FACTORS:  Menarche was at age 61.  First live birth at age 70.  OCP use for approximately 3-4 years.  Ovaries intact: yes.  Hysterectomy: no.  Menopausal status: postmenopausal.  HRT use: 0 years. Colonoscopy: yes; 1-2 polyps. Mammogram within the last year: yes. Number of breast biopsies: 2 biopsies performed at one point in time.Marland Kitchen Up to date with pelvic exams:  yes. Any excessive radiation exposure in the past:  no  Past Medical History:  Diagnosis Date  . Allergic rhinitis   . Allergy   . Asthma   . Atypical ductal hyperplasia of right breast   . 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 Diabetes mellitus without complication ([place])   . Family history of breast cancer   . Family history of colon cancer   . Hypercholesteremia   . Hypothyroidism   . Lumbar herniated disc   . OSA (obstructive sleep apnea)    used to use CPAP, not any more over 1 1/2 yrs  .  Sleep apnea    no cpap in 2 yrs   . Vitamin D deficiency     Past Surgical History:  Procedure Laterality Date  . APPENDECTOMY    . BREAST LUMPECTOMY WITH RADIOACTIVE SEED LOCALIZATION Right 08/12/2017   Procedure: RIGHT BREAST LUMPECTOMY X'S 2 WITH RADIOACTIVE SEED LOCALIZATION X'S 2;  Surgeon: Fanny Skates, MD;  Location: [facility];  Service: General;  Laterality: Right;  . COLONOSCOPY     11 yrs ago in [place] but given a 10 yr recall per pt   . GANGLION CYST EXCISION     72 years old  . INCISION AND DRAINAGE ABSCESS ANAL    . KNEE SURGERY  2014  . SHOULDER SURGERY  1976    Social History   Socioeconomic History  . Marital status: Single    Spouse name: Not on file  . Number of children: Not on file  . Years of education: Not on file  . Highest education level: Not on file  Occupational History  . Not on file  Social Needs  . Financial resource strain: Not on file  . Food insecurity:    Worry: Not on file    Inability: Not on file  . Transportation needs:    Medical: Not on file    Non-medical: Not on file  Tobacco Use  .  Smoking status: Former Smoker    Types: Cigarettes  . Smokeless tobacco: Never Used  Substance and Sexual Activity  . Alcohol use: Yes    Comment: 2 per month  . Drug use: No  . Sexual activity: Not Currently  Lifestyle  . Physical activity:    Days per week: Not on file    Minutes per session: Not on file  . Stress: Not on file  Relationships  . Social connections:    Talks on phone: Not on file    Gets together: Not on file    Attends religious service: Not on file    Active member of club or organization: Not on file    Attends meetings of clubs or organizations: Not on file    Relationship status: Not on file  Other Topics Concern  . Not on file  Social History Narrative   Single.   Moved from [place] to [place] several weeks ago.   Family lives in [place].   Professor of sociology and psychology.    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     FAMILY HISTORY:  We obtained a detailed, 4-generation family history.  Significant diagnoses are listed below: Family History  Problem Relation Age of Onset  . Alzheimer's disease Mother   . Asthma Mother   . Arthritis Father   . Breast cancer Sister 13  . Colon cancer Paternal Grandmother   . Colon polyps Neg Hx   . Rectal cancer Neg Hx   . Stomach cancer Neg Hx     The patient has two daughters who are cancer free.  She has two sisters and a brother.  One sister was diagnosed at 29 with breast cancer.  Both parents are deceased.  The patient's mother died from complications of dementia.  She had two brothers, one who had liver cancer.  Both maternal grandparents are deceased.  The patient's father was one of 11 children.  There is no known cancer on that side of the family.  The paternal grandmother had colon cancer and died in her 28's.  [name] is unaware of previous family history of genetic testing for hereditary cancer risks. Patient's maternal ancestors are of [place] and [place] descent, and paternal ancestors are of [place] and [place] descent. There is no reported Ashkenazi Jewish ancestry. There is no known consanguinity.  GENETIC COUNSELING ASSESSMENT: Molly Wall is a 72 y.o. female with a family history of breast and colon cancer which is somewhat suggestive of a hereditary cancer syndrome and predisposition to cancer. We, therefore, discussed and recommended the following at today's visit.   DISCUSSION: We discussed that about 5-10% of breast cancer is hereditary with most cases due to BRCA mutations.  Other genes, such as ATM, CHEK2 and PALB2, are also part of hereditary cancer syndromes.    We reviewed the characteristics, features and inheritance patterns of hereditary cancer syndromes. We also discussed genetic testing, including the appropriate family  members to test, the process of testing, insurance coverage and turn-around-time for results. We discussed the implications of a negative, positive and/or variant of uncertain significant result. We recommended Ms. Wall pursue genetic testing for the common hereditary cancer gene panel. The Hereditary Gene Panel offered by [facility] includes sequencing and/or deletion duplication testing of the following 47 genes: APC, ATM, AXIN2, BARD1, BMPR1A, BRCA1, BRCA2, BRIP1, CDH1, CDK4, CDKN2A (p14ARF), CDKN2A (p16INK4a), CHEK2, CTNNA1, DICER1, EPCAM (Deletion/duplication testing only), GREM1 (promoter region deletion/duplication  testing only), KIT, MEN1, MLH1, MSH2, MSH3, MSH6, MUTYH, NBN, NF1, NHTL1, PALB2, PDGFRA, PMS2, POLD1, POLE, PTEN, RAD50, RAD51C, RAD51D, SDHB, SDHC, SDHD, SMAD4, SMARCA4. STK11, TP53, TSC1, TSC2, and VHL.  The following genes were evaluated for sequence changes only: SDHA and HOXB13 c.251G>A variant only.   Based on Ms. Wall's family history of cancer, she meets medical criteria for genetic testing. Despite that she meets criteria, she does not meet Medicare criteria as she is not affected with cancer.  She states that her insurance is Pharmacist, community and that it does not state it is a Careers information officer.  Therefore I did not have her sign an ABN.   We discussed that if her out of pocket cost for testing is over $100, the laboratory will call and confirm whether she wants to proceed with testing.  If the out of pocket cost of testing is less than $100 she will be billed by the genetic testing laboratory.   Based on the patient's personal and family history, statistical models (Tyrer Cusik)  and literature data were used to estimate her risk of developing breast cancer. These estimate her lifetime risk of developing breast cancer to be approximately 18.8%. This estimation does not take into account any genetic testing results.  The patient's lifetime breast cancer risk is a preliminary  estimate based on available information using one of several models endorsed by the [employer] (ACS). The ACS recommends consideration of breast MRI screening as an adjunct to mammography for patients at high risk (defined as 20% or greater lifetime risk). A more detailed breast cancer risk assessment can be considered, if clinically indicated.    PLAN: After considering the risks, benefits, and limitations, [name]  provided informed consent to pursue genetic testing and the blood sample was sent to [facility] for analysis of the common hereditary cancer panel. Results should be available within approximately 2-3 weeks' time, at which point they will be disclosed by telephone to Ms. Wall, as will any additional recommendations warranted by these results. [name] will receive a summary of her genetic counseling visit and a copy of her results once available. This information will also be available in Epic. We encouraged Ms. Wall to remain in contact with cancer genetics annually so that we can continuously update the family history and inform her of any changes in cancer genetics and testing that may be of benefit for her family. Ms. Wall's questions were answered to her satisfaction today. Our contact information was provided should additional questions or concerns arise.  Lastly, we encouraged Ms. Wall to remain in contact with cancer genetics annually so that we can continuously update the family history and inform her of any changes in cancer genetics and testing that may be of benefit for this family.   Ms.  Wall's questions were answered to her satisfaction today. Our contact information was provided should additional questions or concerns arise. Thank you for the referral and allowing [place] to share in the care of your patient.   Karen P. Florene Glen, [place], [facility] Certified Genetic Counselor [email or web address] phone: [phone]  The patient was seen for a  total of 60 minutes in face-to-face genetic counseling.  This patient was discussed with Drs. Magrinat, Lindi Adie and/or Burr Medico who agrees with the above.    _______________________________________________________________________ For Office Staff:  Number of people involved in session: 1 Was an Intern/ student involved with case: no

## 2017-11-05 NOTE — Progress Notes (Signed)
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 She underwent unilateral right diagnostic mammography with tomography [facility] on 06/25/2017 showing: breast density category B. There were multiple scattered groups of suspicious fine pleomorphic and amorphous calcifications in the right breast.  Accordingly on 06/26/2017 she proceeded to biopsy of the right breast area in question. The pathology from this procedure showed ([id number]): Atypical ductal hyperplasia in the posterior and anterior regions and Atypical lobular hyperplasia in the posterior brest  She underwent right lumpectomy of these two areas ([id number]) on 08/12/2017 with pathology showing: In the right inferior retroaerolar area and right upper inner quadrant, there was  fibrocystic changes with calcifications with no evidence of malignancy.  The patient's subsequent history is as detailed below.  INTERVAL HISTORY: [name] was evaluated in the high-risk breast cancer clinic on 11/06/2017.   REVIEW OF SYSTEMS: There were no specific symptoms leading to the original mammogram, which was routinely scheduled. She met with genetic counseling on 11/04/2017. The  patient's mother passed away about 3 weeks ago, and she is grieving her loss. The patient denies unusual headaches, visual changes, nausea, vomiting, stiff neck, dizziness, or gait imbalance. There has been no cough, phlegm production, or pleurisy, no chest pain or pressure, and no change in bowel or bladder habits. The patient denies fever, rash, bleeding, unexplained fatigue or unexplained weight loss. A detailed review of systems was otherwise entirely negative.   PAST MEDICAL HISTORY: Past Medical History:  Diagnosis Date  . Allergic rhinitis   . Allergy   . Asthma   . Atypical ductal hyperplasia of right breast   . 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 Diabetes mellitus without complication ([place])   . Family history of breast cancer   . Family history of colon cancer   . Hypercholesteremia   . Hypothyroidism   . Lumbar herniated disc   . OSA (obstructive sleep apnea)    used to use CPAP, not any more over 1 1/2 yrs  . Sleep apnea    no cpap in 2 yrs   . Vitamin D deficiency     PAST SURGICAL HISTORY: Past Surgical History:  Procedure Laterality Date  . APPENDECTOMY    . BREAST LUMPECTOMY WITH RADIOACTIVE SEED LOCALIZATION Right 08/12/2017   Procedure: RIGHT BREAST LUMPECTOMY X'S 2 WITH RADIOACTIVE SEED LOCALIZATION X'S 2;  Surgeon: Fanny Skates, MD;  Location: [facility];  Service: General;  Laterality: Right;  . COLONOSCOPY     11 yrs ago in [place] but given a 10 yr recall per pt   . GANGLION CYST EXCISION     72 years old  . INCISION AND DRAINAGE ABSCESS ANAL    . KNEE SURGERY  2014  . SHOULDER SURGERY  1976    FAMILY HISTORY Family History  Problem Relation Age of Onset  .  Alzheimer's disease Mother   . Asthma Mother   . Arthritis Father   . Breast cancer Sister 56  . Colon cancer Paternal Grandmother   . Colon polyps Neg Hx   . Rectal cancer Neg Hx   . Stomach cancer Neg Hx   The patient's mother, Molly Wall, died  at age 23 in 11/03/2017 due to dementia. The patient's father, Molly Wall, died at age 62 due to heart attack and other heart issues. She had 1 brother and 2 sisters. One of the patient's sisters died of breast cancer at age 30 and diagnosed at age 47. There was a maternal uncle who died of liver cancer. There was a paternal grandmother who had colon cancer.   GYNECOLOGIC HISTORY:  No LMP recorded. Patient is postmenopausal. Menarche: 72 years old Age at first live birth: 72 years old She is [place] P2. Her LMP was around age 72. She took HRT for a few months (less than a year).    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  She is divorced. She lives alone, with no pets. The patient's daughter Molly Wall is age 42 and lives in [place], [place] as a homemaker and home schools her children. Molly Wall has a Conservator, museum/gallery in [employer] in education and domestic violence. The patient's daughter Molly Wall is age 79, lives in [place], and also home schools her children. The patient has 4 grand children.  She attends Advertising account [employer], where her son-in-law is one of the pastors.     ADVANCED DIRECTIVES: to be discussed   HEALTH MAINTENANCE: Social History   Tobacco Use  . Smoking status: Former Smoker    Types: Cigarettes  . Smokeless tobacco: Never Used  Substance Use Topics  . Alcohol use: Yes    Wall: 2 per month  . Drug use: No     Colonoscopy: 09/15/2017 / Dr. Loletha Carrow / found small/large diverticula and polyp removal.  PAP:   Bone density: remote/ normal   Allergies  Allergen Reactions  . Iodine Anaphylaxis    Radioactive iodine dye    Current Outpatient Medications  Medication Sig Dispense Refill  . albuterol (PROAIR HFA) 108 (90 Base) MCG/ACT inhaler Inhale 2 puffs into the lungs every 6 (six) hours as needed for wheezing or shortness of breath. NEED APPOINTMENT FOR ANY MORE REFILLS 18 g 0  . atorvastatin (LIPITOR) 10 MG  tablet Take 1 tablet (10 mg total) by mouth daily. NEED OFFICE VISIT FOR ANY MORE REFILLS 90 tablet 0  . B-D UF III MINI PEN NEEDLES 31G X 5 MM MISC USE AS DIRECTED USE DAILY TO ADMINISTER VICTOZA  3  . fluticasone (FLONASE) 50 MCG/ACT nasal spray Place 2 sprays into both nostrils daily. 48 g 1  . Fluticasone-Salmeterol (ADVAIR) 250-50 MCG/DOSE AEPB Inhale 1 puff into the lungs 2 (two) times daily as needed.     . Insulin Glargine (BASAGLAR KWIKPEN) 100 UNIT/ML SOPN Inject 30 Units into the skin daily at 10 pm.     . levothyroxine (SYNTHROID, LEVOTHROID) 150 MCG tablet TAKE 1 TABLET EVERY MORNING WITH A FULL GLASS OF WATER ON AN EMPTY STOMACH 90 tablet 0  . METFORMIN HCL PO Take 1,000 mg by mouth daily.     . montelukast (SINGULAIR) 10 MG tablet Take 10 mg by mouth at bedtime.    Glory Rosebush VERIO test strip USE AS DIRECTED TO CHECK FASTING BLOOD SUGARS TWICE DAILY.  3  . VICTOZA 18  MG/3ML SOPN INJECT 0.6 MG SUBCUTANEOUSLY DAILY FOR 2 WEEKS, THEN 1.2 FOR 2 WEEKS THEN 1.8 MG THEREAFTER  6   No current facility-administered medications for this visit.     OBJECTIVE: Middle-aged white woman who appears well  Vitals:   11/06/17 1200  BP: 133/72  Pulse: 82  Resp: 17  Temp: 98.4 F (36.9 C)  SpO2: 100%     Body mass index is 31.78 kg/m.   Wt Readings from Last 3 Encounters:  11/06/17 191 lb (86.6 kg)  10/06/17 192 lb (87.1 kg)  09/15/17 192 lb (87.1 kg)      ECOG FS:0 - Asymptomatic  Ocular: Sclerae unicteric, pupils round and equal Lymphatic: No cervical or supraclavicular adenopathy Lungs no rales or rhonchi Heart regular rate and rhythm Abd soft, nontender, positive bowel sounds MSK no focal spinal tenderness, no joint edema Neuro: non-focal, well-oriented, appropriate affect Breasts: The right breast is status post recent lumpectomy.  The cosmetic result is excellent.  There is no palpable mass.  There are no skin or nipple changes of concern.  The left breast is unremarkable.   Both axillae are benign.   LAB RESULTS:  CMP     Component Value Date/Time   NA 139 08/06/2017 1200   K 4.6 08/06/2017 1200   CL 99 (L) 08/06/2017 1200   CO2 30 08/06/2017 1200   GLUCOSE 257 (H) 08/06/2017 1200   BUN 12 08/06/2017 1200   CREATININE 0.64 08/06/2017 1200   CALCIUM 9.9 08/06/2017 1200   PROT 6.7 08/06/2017 1200   ALBUMIN 3.9 08/06/2017 1200   AST 14 (L) 08/06/2017 1200   ALT 15 08/06/2017 1200   ALKPHOS 111 08/06/2017 1200   BILITOT 0.5 08/06/2017 1200   GFRNONAA >60 08/06/2017 1200   GFRAA >60 08/06/2017 1200    No results found for: TOTALPROTELP, ALBUMINELP, A1GS, A2GS, BETS, BETA2SER, GAMS, MSPIKE, SPEI  No results found for: KPAFRELGTCHN, LAMBDASER, KAPLAMBRATIO  Lab Results  Component Value Date   WBC 4.8 08/06/2017   NEUTROABS 2.9 08/06/2017   HGB 14.4 08/06/2017   HCT 45.8 08/06/2017   MCV 85.0 08/06/2017   PLT 230 08/06/2017    '@LASTCHEMISTRY' @  No results found for: LABCA2  No components found for: HMCNOB096  No results for input(s): INR in the last 168 hours.  No results found for: LABCA2  No results found for: GEZ662  No results found for: HUT654  No results found for: YTK354  No results found for: CA2729  No components found for: HGQUANT  No results found for: CEA1 / No results found for: CEA1   No results found for: AFPTUMOR  No results found for: CHROMOGRNA  No results found for: PSA1  No visits with results within 3 Day(s) from this visit.  Latest known visit with results is:  Admission on 08/12/2017, Discharged on 08/12/2017  Component Date Value Ref Range Status  . WBC 08/06/2017 4.8  4.0 - 10.5 K/uL Final  . RBC 08/06/2017 5.39* 3.87 - 5.11 MIL/uL Final  . Hemoglobin 08/06/2017 14.4  12.0 - 15.0 g/dL Final  . HCT 08/06/2017 45.8  36.0 - 46.0 % Final  . MCV 08/06/2017 85.0  78.0 - 100.0 fL Final  . MCH 08/06/2017 26.7  26.0 - 34.0 pg Final  . MCHC 08/06/2017 31.4  30.0 - 36.0 g/dL Final  . RDW 08/06/2017 12.9   11.5 - 15.5 % Final  . Platelets 08/06/2017 230  150 - 400 K/uL Final  . Neutrophils Relative % 08/06/2017  59  % Final  . Neutro Abs 08/06/2017 2.9  1.7 - 7.7 K/uL Final  . Lymphocytes Relative 08/06/2017 28  % Final  . Lymphs Abs 08/06/2017 1.3  0.7 - 4.0 K/uL Final  . Monocytes Relative 08/06/2017 7  % Final  . Monocytes Absolute 08/06/2017 0.3  0.1 - 1.0 K/uL Final  . Eosinophils Relative 08/06/2017 5  % Final  . Eosinophils Absolute 08/06/2017 0.2  0.0 - 0.7 K/uL Final  . Basophils Relative 08/06/2017 1  % Final  . Basophils Absolute 08/06/2017 0.0  0.0 - 0.1 K/uL Final  . Immature Granulocytes 08/06/2017 0  % Final  . Abs Immature Granulocytes 08/06/2017 0.0  0.0 - 0.1 K/uL Final   Performed at [facility] [address], [place], [place] [address]  . Sodium 08/06/2017 139  135 - 145 mmol/L Final  . Potassium 08/06/2017 4.6  3.5 - 5.1 mmol/L Final  . Chloride 08/06/2017 99* 101 - 111 mmol/L Final  . CO2 08/06/2017 30  22 - 32 mmol/L Final  . Glucose, Bld 08/06/2017 257* 65 - 99 mg/dL Final  . BUN 08/06/2017 12  6 - 20 mg/dL Final  . Creatinine, Ser 08/06/2017 0.64  0.44 - 1.00 mg/dL Final  . Calcium 08/06/2017 9.9  8.9 - 10.3 mg/dL Final  . Total Protein 08/06/2017 6.7  6.5 - 8.1 g/dL Final  . Albumin 08/06/2017 3.9  3.5 - 5.0 g/dL Final  . AST 08/06/2017 14* 15 - 41 U/L Final  . ALT 08/06/2017 15  14 - 54 U/L Final  . Alkaline Phosphatase 08/06/2017 111  38 - 126 U/L Final  . Total Bilirubin 08/06/2017 0.5  0.3 - 1.2 mg/dL Final  . GFR calc non Af Amer 08/06/2017 >60  >60 mL/min Final  . GFR calc Af Amer 08/06/2017 >60  >60 mL/min Final   Wall: (NOTE) The eGFR has been calculated using the CKD EPI equation. This calculation has not been validated in all clinical situations. eGFR's persistently <60 mL/min signify possible Chronic Kidney Disease.   Georgiann Hahn gap 08/06/2017 10  5 - 15 Final   Performed at [facility], [place] [address], [place], [place]  [address]  . Glucose-Capillary 08/12/2017 191* 65 - 99 mg/dL Final  . Glucose-Capillary 08/12/2017 157* 65 - 99 mg/dL Final    (this displays the last labs from the last 3 days)  No results found for: TOTALPROTELP, ALBUMINELP, A1GS, A2GS, BETS, BETA2SER, GAMS, MSPIKE, SPEI (this displays SPEP labs)  No results found for: KPAFRELGTCHN, LAMBDASER, KAPLAMBRATIO (kappa/lambda light chains)  No results found for: HGBA, HGBA2QUANT, HGBFQUANT, HGBSQUAN (Hemoglobinopathy evaluation)   No results found for: LDH  No results found for: IRON, TIBC, IRONPCTSAT (Iron and TIBC)  No results found for: FERRITIN  Urinalysis No results found for: COLORURINE, APPEARANCEUR, LABSPEC, PHURINE, GLUCOSEU, HGBUR, BILIRUBINUR, KETONESUR, PROTEINUR, UROBILINOGEN, NITRITE, LEUKOCYTESUR   STUDIES: No results found.  ELIGIBLE FOR AVAILABLE RESEARCH PROTOCOL: no  ASSESSMENT: 72 y.o. [place], [place] woman status post right breast biopsy x2 06/26/2017 showing atypical ductal hyperplasia and atypical lobular hyperplasia  (1) right lumpectomy x2 on 08/12/2017 shows no evidence of malignancy at either site  (2) genetics testing 11/04/2017 results pending  (3) breast cancer high risk: considering risk reduction and intensified screening strategies  PLAN: We spent more than 50% of today's 40 minute appointment in face-to-face counseling and coordination of care regarding the biology of the patient's diagnosis and the specifics of her situation. Trivia understands ADH means,  first, relatively unrestricted growth of breast cells and, in addition, some morphologically different features from simple hyperplasia. Atypical ductal hyperplasia can be difficult to tell apart ductal cancer in situ. For those reasons lumpectomy was indicated.  ADH is also a marker of breast cancer risk. The risk of developing breast cancer in patients with ADH approaches 1% per year. The new cancer can develop in either breast. One half of those  tumors would be invasive.  Atypical lobular hyperplasia similarly is a marker of increased breast cancer in either breast.  Taking this finding together with the patient's family history, her risk of breast cancer in her lifetime according to the Kellogg model is 24%.  There are 2 ways of dealing with this problem.  One is risk reduction.  The other one is intensified screening.  One way to reduce the risk is bilateral mastectomies.  We discussed this in great detail and she understands the different types of mastectomies as well as the option of no reconstruction and the various types of possible reconstructions.  Certainly if she were to go this way, nipple sparing mastectomy with implant reconstruction might be her optimal choice.  Bilateral mastectomies does not entirely eliminate the risk of breast cancer but would reduce it to less than 5% over the patient's lifetime.  Another approach to risk reduction is antiestrogens.  Aromatase inhibitors [anastrozole, letrozole and exemestane] are not a choice in pre-menopausal patients. Tamoxifen or raloxefene can be used in pre- or post-menopausal patients.   We discussed these agents in detail including their possible toxicities, side effects and complications.  We also discussed the fact that either of these classes of drugs taken for 5 years would cut her risk of breast cancer in half.  This would mean her lifetime risk would drop from about 24% to about 12%.  A third way to reduce her cancer risk--not specific to breast cancer--is to optimize her diet and exercise routine.  Ideally she would be on a mostly vegetable diet with limited carbohydrates. Meats and dairy are allowed. The exercise goal is 45 minutes 5 times a week of activity vigorous enough to induce perspiration. This would reduce her risk of any cancer developing by between 1 and 3%.  Incidentally it would also possibly "cure" her new diagnosis of diabetes  After discussing the options for  risk reduction, we discussed intensified screening.  This would include yearly mammography with tomography, biannual breast exams by an MD, and a yearly breast MRI.  Arlenne understands that adding the MRI may increase cost and also put her at risk for false positives, which may lead to unnecessary procedures.  The point of course would be to find any cancer that may develop at the very earliest stages to increase the chance of cure with minimal interventions (specifically without chemotherapy).  Of course we are waiting for her genetics results, which may change some of the numbers just discussed.  We also discussed her daughter's risk and she will inform them of the use of the [place] which they may use for their own risk estimation.  Finally we discussed the fact that pain in the surgical breast is very common and does not mean that she has breast cancer in that breast.  Belina is not ready to make a definitive choice today.  She is going to consider all her options.  If she decides on an intensified screening or risk reduction strategy she will let me know and I will implement it for  her.  In that case I would want to see her again but as of now we are not making a routine return visit here for her.  She knows I will be glad to see her anytime in the future if we can be of further assistance.      Paulett has a good understanding of the overall plan. She agrees with it. She knows the goal of treatment in her case is cure. She will call with any problems that may develop before her next visit here.   Billyjoe Go, Virgie Dad, MD  11/06/17 12:25 PM Medical Oncology and Hematology [facility] [address] [place], [place] [address] Tel. [phone]    Fax. [phone]  Alice Rieger, am acting as scribe for Chauncey Cruel MD.  I, Lurline Del MD, have reviewed the above documentation for accuracy and completeness, and I agree with the above.

## 2017-11-06 ENCOUNTER — Inpatient Hospital Stay (HOSPITAL_BASED_OUTPATIENT_CLINIC_OR_DEPARTMENT_OTHER): Payer: BLUE CROSS/BLUE SHIELD | Admitting: Oncology

## 2017-11-06 VITALS — BP 133/72 | HR 82 | Temp 98.4°F | Resp 17 | Ht 65.0 in | Wt 191.0 lb

## 2017-11-06 DIAGNOSIS — Z87891 Personal history of nicotine dependence: Secondary | ICD-10-CM

## 2017-11-06 DIAGNOSIS — E039 Hypothyroidism, unspecified: Secondary | ICD-10-CM

## 2017-11-06 DIAGNOSIS — E119 Type 2 diabetes mellitus without complications: Secondary | ICD-10-CM

## 2017-11-06 DIAGNOSIS — Z803 Family history of malignant neoplasm of breast: Secondary | ICD-10-CM

## 2017-11-06 DIAGNOSIS — E7800 Pure hypercholesterolemia, unspecified: Secondary | ICD-10-CM

## 2017-11-06 DIAGNOSIS — F4320 Adjustment disorder, unspecified: Secondary | ICD-10-CM

## 2017-11-06 DIAGNOSIS — N6091 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: Secondary | ICD-10-CM | POA: Insufficient documentation

## 2017-11-06 DIAGNOSIS — Z79899 Other long term (current) drug therapy: Secondary | ICD-10-CM | POA: Diagnosis not present

## 2017-11-06 DIAGNOSIS — Z794 Long term (current) use of insulin: Secondary | ICD-10-CM | POA: Diagnosis not present

## 2017-11-06 DIAGNOSIS — Z800 Family history of malignant neoplasm of digestive organs: Secondary | ICD-10-CM | POA: Diagnosis not present

## 2017-11-09 ENCOUNTER — Telehealth: Payer: Self-pay | Admitting: Oncology

## 2017-11-09 NOTE — Telephone Encounter (Signed)
Per 8/23 los, no new orders.

## 2017-11-27 ENCOUNTER — Encounter: Payer: Self-pay | Admitting: Genetic Counselor

## 2017-11-27 ENCOUNTER — Telehealth: Payer: Self-pay | Admitting: Genetic Counselor

## 2017-11-27 DIAGNOSIS — Z1379 Encounter for other screening for genetic and chromosomal anomalies: Secondary | ICD-10-CM | POA: Insufficient documentation

## 2017-11-27 NOTE — Telephone Encounter (Signed)
Revealed negative genetic testing.  Discussed that we do not know why there is cancer in the family. It could be due to a different gene that we are not testing, or maybe our current technology may not be able to pick something up.  It will be important for her to keep in contact with genetics to keep up with whether additional testing may be needed.  

## 2017-12-01 ENCOUNTER — Ambulatory Visit: Payer: Self-pay | Admitting: Genetic Counselor

## 2017-12-01 DIAGNOSIS — Z803 Family history of malignant neoplasm of breast: Secondary | ICD-10-CM

## 2017-12-01 DIAGNOSIS — Z1379 Encounter for other screening for genetic and chromosomal anomalies: Secondary | ICD-10-CM

## 2017-12-01 DIAGNOSIS — Z800 Family history of malignant neoplasm of digestive organs: Secondary | ICD-10-CM

## 2017-12-01 NOTE — Progress Notes (Signed)
HPI:  [name] was previously seen in the [facility] clinic due to a family history of cancer and concerns regarding a hereditary predisposition to cancer. Please refer to our prior cancer genetics clinic note for more information regarding Molly Wall's medical, social and family histories, and our assessment and recommendations, at the time. Molly Wall's recent genetic test results were disclosed to her, as were recommendations warranted by these results. These results and recommendations are discussed in more detail below.  CANCER HISTORY:   No history exists.    FAMILY HISTORY:  We obtained a detailed, 4-generation family history.  Significant diagnoses are listed below: Family History  Problem Relation Age of Onset  . Alzheimer's disease Mother   . Asthma Mother   . Arthritis Father   . Breast cancer Sister 58  . Colon cancer Paternal Grandmother   . Colon polyps Neg Hx   . Rectal cancer Neg Hx   . Stomach cancer Neg Hx     The patient has two daughters who are cancer free.  She has two sisters and a brother.  One sister was diagnosed at 69 with breast cancer.  Both parents are deceased.  The patient's mother died from complications of dementia.  She had two brothers, one who had liver cancer.  Both maternal grandparents are deceased.  The patient's father was one of 11 children.  There is no known cancer on that side of the family.  The paternal grandmother had colon cancer and died in her 66's.  [name] is unaware of previous family history of genetic testing for hereditary cancer risks. Patient's maternal ancestors are of [place] and [place] descent, and paternal ancestors are of [place] and [place] descent. There is no reported Ashkenazi Jewish ancestry. There is no known consanguinity.  GENETIC TEST RESULTS: Genetic testing reported out on November 23, 2017 through the common hereditary cancer panel found no deleterious mutations.  The  Hereditary Gene Panel offered by [facility] includes sequencing and/or deletion duplication testing of the following 47 genes: APC, ATM, AXIN2, BARD1, BMPR1A, BRCA1, BRCA2, BRIP1, CDH1, CDK4, CDKN2A (p14ARF), CDKN2A (p16INK4a), CHEK2, CTNNA1, DICER1, EPCAM (Deletion/duplication testing only), GREM1 (promoter region deletion/duplication testing only), KIT, MEN1, MLH1, MSH2, MSH3, MSH6, MUTYH, NBN, NF1, NHTL1, PALB2, PDGFRA, PMS2, POLD1, POLE, PTEN, RAD50, RAD51C, RAD51D, SDHB, SDHC, SDHD, SMAD4, SMARCA4. STK11, TP53, TSC1, TSC2, and VHL.  The following genes were evaluated for sequence changes only: SDHA and HOXB13 c.251G>A variant only. The test report has been scanned into EPIC and is located under the Molecular Pathology section of the Results Review tab.    We discussed with Molly Wall that since the current genetic testing is not perfect, it is possible there may be a gene mutation in one of these genes that current testing cannot detect, but that chance is small.  We also discussed, that it is possible that another gene that has not yet been discovered, or that we have not yet tested, is responsible for the cancer diagnoses in the family, and it is, therefore, important to remain in touch with cancer genetics in the future so that we can continue to offer Molly Wall the most up to date genetic testing.   CANCER SCREENING RECOMMENDATIONS:  This normal result is reassuring and indicates that Molly Wall does not likely have an increased risk of cancer due to a mutation in one of these genes.  We, therefore, recommended  Molly Wall continue to follow the cancer screening guidelines provided by  her primary healthcare providers.   An individual's cancer risk and medical management are not determined by genetic test results alone. Overall cancer risk assessment incorporates additional factors, including personal medical history, family history, and any available genetic information that may result in a personalized  plan for cancer prevention and surveillance.  RECOMMENDATIONS FOR FAMILY MEMBERS:  Women in this family might be at some increased risk of developing cancer, over the general population risk, simply due to the family history of cancer.  We recommended women in this family have a yearly mammogram beginning at age 6, or 22 years younger than the earliest onset of cancer, an annual clinical breast exam, and perform monthly breast self-exams. Women in this family should also have a gynecological exam as recommended by their primary provider. All family members should have a colonoscopy by age 32.  FOLLOW-UP: Lastly, we discussed with Molly Wall that cancer genetics is a rapidly advancing field and it is possible that new genetic tests will be appropriate for her and/or her family members in the future. We encouraged her to remain in contact with cancer genetics on an annual basis so we can update her personal and family histories and let her know of advances in cancer genetics that may benefit this family.   Our contact number was provided. Molly Wall's questions were answered to her satisfaction, and she knows she is welcome to call us at anytime with additional questions or concerns.   Roma Kayser, MS, [facility] Certified Genetic Counselor [email or web address]

## 2017-12-04 ENCOUNTER — Ambulatory Visit: Payer: BLUE CROSS/BLUE SHIELD | Admitting: Primary Care

## 2017-12-09 ENCOUNTER — Encounter: Payer: Self-pay | Admitting: Primary Care

## 2017-12-09 ENCOUNTER — Encounter (INDEPENDENT_AMBULATORY_CARE_PROVIDER_SITE_OTHER): Payer: Self-pay

## 2017-12-09 ENCOUNTER — Ambulatory Visit (INDEPENDENT_AMBULATORY_CARE_PROVIDER_SITE_OTHER): Payer: BLUE CROSS/BLUE SHIELD | Admitting: Primary Care

## 2017-12-09 VITALS — BP 126/72 | HR 71 | Temp 98.0°F | Ht 65.0 in | Wt 194.0 lb

## 2017-12-09 DIAGNOSIS — E039 Hypothyroidism, unspecified: Secondary | ICD-10-CM

## 2017-12-09 DIAGNOSIS — Z794 Long term (current) use of insulin: Secondary | ICD-10-CM | POA: Diagnosis not present

## 2017-12-09 DIAGNOSIS — E119 Type 2 diabetes mellitus without complications: Secondary | ICD-10-CM | POA: Diagnosis not present

## 2017-12-09 DIAGNOSIS — N6091 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: Secondary | ICD-10-CM

## 2017-12-09 DIAGNOSIS — Z23 Encounter for immunization: Secondary | ICD-10-CM

## 2017-12-09 DIAGNOSIS — E785 Hyperlipidemia, unspecified: Secondary | ICD-10-CM

## 2017-12-09 DIAGNOSIS — J45909 Unspecified asthma, uncomplicated: Secondary | ICD-10-CM

## 2017-12-09 MED ORDER — METFORMIN HCL ER (OSM) 1000 MG PO TB24: 1000.0000 mg | ORAL_TABLET | Freq: Every day | ORAL | 3 refills | Status: DC

## 2017-12-09 MED ORDER — VICTOZA 18 MG/3ML ~~LOC~~ SOPN: 1.8000 mg | PEN_INJECTOR | Freq: Every day | SUBCUTANEOUS | 5 refills | Status: DC

## 2017-12-09 NOTE — Assessment & Plan Note (Signed)
Following with oncology and plans on starting treatment.

## 2017-12-09 NOTE — Patient Instructions (Addendum)
Stop by the lab prior to leaving today. I will notify you of your results once received.   I'll send refills of levothyroxine and atorvastatin (Lipitor) once I receive your lab results.  Make sure to take your levothyroxine every morning with water only. No food or other medications for 30 minutes.   Check your blood sugar at different times of the day including: Upon waking Before meals 2 hours after meals Bedtime   Please schedule a follow up appointment in 6 months for physical and medicare wellness visit.   It was a pleasure to see you today!   Diabetes Mellitus and Nutrition When you have diabetes (diabetes mellitus), it is very important to have healthy eating habits because your blood sugar (glucose) levels are greatly affected by what you eat and drink. Eating healthy foods in the appropriate amounts, at about the same times every day, can help you:  Control your blood glucose.  Lower your risk of heart disease.  Improve your blood pressure.  Reach or maintain a healthy weight.  Every person with diabetes is different, and each person has different needs for a meal plan. Your health care provider may recommend that you work with a diet and nutrition specialist (dietitian) to make a meal plan that is best for you. Your meal plan may vary depending on factors such as:  The calories you need.  The medicines you take.  Your weight.  Your blood glucose, blood pressure, and cholesterol levels.  Your activity level.  Other health conditions you have, such as heart or kidney disease.  How do carbohydrates affect me? Carbohydrates affect your blood glucose level more than any other type of food. Eating carbohydrates naturally increases the amount of glucose in your blood. Carbohydrate counting is a method for keeping track of how many carbohydrates you eat. Counting carbohydrates is important to keep your blood glucose at a healthy level, especially if you use insulin or  take certain oral diabetes medicines. It is important to know how many carbohydrates you can safely have in each meal. This is different for every person. Your dietitian can help you calculate how many carbohydrates you should have at each meal and for snack. Foods that contain carbohydrates include:  Bread, cereal, rice, pasta, and crackers.  Potatoes and corn.  Peas, beans, and lentils.  Milk and yogurt.  Fruit and juice.  Desserts, such as cakes, cookies, ice cream, and candy.  How does alcohol affect me? Alcohol can cause a sudden decrease in blood glucose (hypoglycemia), especially if you use insulin or take certain oral diabetes medicines. Hypoglycemia can be a life-threatening condition. Symptoms of hypoglycemia (sleepiness, dizziness, and confusion) are similar to symptoms of having too much alcohol. If your health care provider says that alcohol is safe for you, follow these guidelines:  Limit alcohol intake to no more than 1 drink per day for nonpregnant women and 2 drinks per day for men. One drink equals 12 oz of beer, 5 oz of wine, or 1 oz of hard liquor.  Do not drink on an empty stomach.  Keep yourself hydrated with water, diet soda, or unsweetened iced tea.  Keep in mind that regular soda, juice, and other mixers may contain a lot of sugar and must be counted as carbohydrates.  What are tips for following this plan? Reading food labels  Start by checking the serving size on the label. The amount of calories, carbohydrates, fats, and other nutrients listed on the label are based on  one serving of the food. Many foods contain more than one serving per package.  Check the total grams (g) of carbohydrates in one serving. You can calculate the number of servings of carbohydrates in one serving by dividing the total carbohydrates by 15. For example, if a food has 30 g of total carbohydrates, it would be equal to 2 servings of carbohydrates.  Check the number of grams (g)  of saturated and trans fats in one serving. Choose foods that have low or no amount of these fats.  Check the number of milligrams (mg) of sodium in one serving. Most people should limit total sodium intake to less than 2,300 mg per day.  Always check the nutrition information of foods labeled as "low-fat" or "nonfat". These foods may be higher in added sugar or refined carbohydrates and should be avoided.  Talk to your dietitian to identify your daily goals for nutrients listed on the label. Shopping  Avoid buying canned, premade, or processed foods. These foods tend to be high in fat, sodium, and added sugar.  Shop around the outside edge of the grocery store. This includes fresh fruits and vegetables, bulk grains, fresh meats, and fresh dairy. Cooking  Use low-heat cooking methods, such as baking, instead of high-heat cooking methods like deep frying.  Cook using healthy oils, such as olive, canola, or sunflower oil.  Avoid cooking with butter, cream, or high-fat meats. Meal planning  Eat meals and snacks regularly, preferably at the same times every day. Avoid going long periods of time without eating.  Eat foods high in fiber, such as fresh fruits, vegetables, beans, and whole grains. Talk to your dietitian about how many servings of carbohydrates you can eat at each meal.  Eat 4-6 ounces of lean protein each day, such as lean meat, chicken, fish, eggs, or tofu. 1 ounce is equal to 1 ounce of meat, chicken, or fish, 1 egg, or 1/4 cup of tofu.  Eat some foods each day that contain healthy fats, such as avocado, nuts, seeds, and fish. Lifestyle   Check your blood glucose regularly.  Exercise at least 30 minutes 5 or more days each week, or as told by your health care provider.  Take medicines as told by your health care provider.  Do not use any products that contain nicotine or tobacco, such as cigarettes and e-cigarettes. If you need help quitting, ask your health care  provider.  Work with a Social worker or diabetes educator to identify strategies to manage stress and any emotional and social challenges. What are some questions to ask my health care provider?  Do I need to meet with a diabetes educator?  Do I need to meet with a dietitian?  What number can I call if I have questions?  When are the best times to check my blood glucose? Where to find more information:  American Diabetes Association: diabetes.org/food-and-fitness/food  Academy of Nutrition and Dietetics: PokerClues.dk  [employer] of Diabetes and Digestive and Kidney Diseases (NIH): ContactWire.be Summary  A healthy meal plan will help you control your blood glucose and maintain a healthy lifestyle.  Working with a diet and nutrition specialist (dietitian) can help you make a meal plan that is best for you.  Keep in mind that carbohydrates and alcohol have immediate effects on your blood glucose levels. It is important to count carbohydrates and to use alcohol carefully. This information is not intended to replace advice given to you by your health care provider. Make sure you  discuss any questions you have with your health care provider. Document Released: 11/28/2004 Document Revised: 04/07/2016 Document Reviewed: 04/07/2016 Elsevier Interactive Patient Education  [employer].

## 2017-12-09 NOTE — Assessment & Plan Note (Signed)
Following with oncology and will be starting treatment.

## 2017-12-09 NOTE — Assessment & Plan Note (Signed)
Repeat TSH pending. She never returned for repeat TSH when recommended in January 2019. Discussed to take levothyroxine with water only, no food or other medications for 30 minutes. She verbalized understanding.

## 2017-12-09 NOTE — Addendum Note (Signed)
Addended by: Jacqualin Combes on: 12/09/2017 04:40 PM   Modules accepted: Orders

## 2017-12-09 NOTE — Assessment & Plan Note (Signed)
Repeat A1C pending. Continue current medications for now. Pneumonia vaccination UTD. Discussed to schedule eye exam. Foot exam next visit.

## 2017-12-09 NOTE — Progress Notes (Signed)
Subjective:    Patient ID: Molly Wall, female    DOB: [birth year], 72 y.o.   MRN: [id number]  HPI  Ms. Wall is a 72 year old female with a history of atypical lobular hyperplasia of right breast, type 2 diabetes, hypothyroidism who presents today for follow up. She is no longer following with her doctor in [facility] as she's moved up this way.  1) Atypical lobular hyperplasia (posterior breast) Atypical ductal hyperplasia (anterior breast): Currently following with [facility] with her last visit being in August 2019. History of right lumpectomy of those two areas in May 2019, pathology was without malignancy. She underwent genetic testing in September 2019 which found no deleterious mutations.   She has opted to start medication to reduce cancer risk. She is also thinking about doing alternating mammogram and MR every six months.   2) Type 2 Diabetes: Currently managed on Victoza 1.8 mg, Insulin Glargine 30 units daily, Metformin ER 1000 mg daily. She forgets to take her metformin several days of the week.   She is checking her blood glucose one time daily and is getting readings of: AM fasting 120-155  Highest reading: 324 Lowest reading: 60 and 80  Last A1C: 6.8 in January 2018 from our files. It has been checked more recently by her MD in [facility].  Last Eye Exam: Due, she will schedule Pneumonia Vaccination: Completed in 2016 ACE/ARB: Urine microalbumin pending Statin: Atorvastatin   3) Hypothyroidism: Currently managed on levothyroxine 150 mcg. Her last TSH was 0.30 in January 2019. Her dose was decreased to 150 mcg and she was asked to return 6 weeks later for follow up labs. She never returned.  She's taking her levothyroxine with water and coffee. She takes all of her medications at once, also drinks coffee.   BP Readings from Last 3 Encounters:  12/09/17 126/72  11/06/17 133/72  10/06/17 102/70     Review of Systems  Constitutional: Negative for  fatigue and unexpected weight change.  Respiratory: Negative for shortness of breath.   Cardiovascular: Negative for chest pain.  Neurological: Negative for dizziness and numbness.       Past Medical History:  Diagnosis Date  . Allergic rhinitis   . Allergy   . Asthma   . Atypical ductal hyperplasia of right breast   . 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 Diabetes mellitus without complication ([place])   . Family history of breast cancer   . Family history of colon cancer   . Hypercholesteremia   . Hypothyroidism   . Lumbar herniated disc   . OSA (obstructive sleep apnea)    used to use CPAP, not any more over 1 1/2 yrs  . Sleep apnea    no cpap in 2 yrs   . Vitamin D deficiency      Social History   Socioeconomic History  . Marital status: Single    Spouse name: Not on file  . Number of children: Not on file  . Years of education: Not on file  . Highest education level: Not on file  Occupational History  . Not on file  Social Needs  . Financial resource strain: Not on file  . Food insecurity:    Worry: Not on file    Inability: Not on file  . Transportation needs:    Medical: Not on file    Non-medical: Not on file  Tobacco Use  . Smoking status: Former Smoker  Types: Cigarettes  . Smokeless tobacco: Never Used  Substance and Sexual Activity  . Alcohol use: Yes    Comment: 2 per month  . Drug use: No  . Sexual activity: Not Currently  Lifestyle  . Physical activity:    Days per week: Not on file    Minutes per session: Not on file  . Stress: Not on file  Relationships  . Social connections:    Talks on phone: Not on file    Gets together: Not on file    Attends religious service: Not on file    Active member of club or organization: Not on file    Attends meetings of clubs or organizations: Not on file    Relationship status: Not on file  . Intimate partner violence:    Fear of current or ex partner: Not on file    Emotionally  abused: Not on file    Physically abused: Not on file    Forced sexual activity: Not on file  Other Topics Concern  . Not on file  Social History Narrative   Single.   Moved from [place] to [place] several weeks ago.   Family lives in [place].   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    Past Surgical History:  Procedure Laterality Date  . APPENDECTOMY    . BREAST LUMPECTOMY WITH RADIOACTIVE SEED LOCALIZATION Right 08/12/2017   Procedure: RIGHT BREAST LUMPECTOMY X'S 2 WITH RADIOACTIVE SEED LOCALIZATION X'S 2;  Surgeon: Fanny Skates, MD;  Location: [facility];  Service: General;  Laterality: Right;  . COLONOSCOPY     11 yrs ago in [place] but given a 10 yr recall per pt   . GANGLION CYST EXCISION     72 years old  . INCISION AND DRAINAGE ABSCESS ANAL    . KNEE SURGERY  2014  . SHOULDER SURGERY  1976    Family History  Problem Relation Age of Onset  . Alzheimer's disease Mother   . Asthma Mother   . Arthritis Father   . Breast cancer Sister 25  . Colon cancer Paternal Grandmother   . Colon polyps Neg Hx   . Rectal cancer Neg Hx   . Stomach cancer Neg Hx     Allergies  Allergen Reactions  . Iodine Anaphylaxis    Radioactive iodine dye    Current Outpatient Medications on File Prior to Visit  Medication Sig Dispense Refill  . albuterol (PROAIR HFA) 108 (90 Base) MCG/ACT inhaler Inhale 2 puffs into the lungs every 6 (six) hours as needed for wheezing or shortness of breath. NEED APPOINTMENT FOR ANY MORE REFILLS 18 g 0  . atorvastatin (LIPITOR) 10 MG tablet Take 1 tablet (10 mg total) by mouth daily. NEED OFFICE VISIT FOR ANY MORE REFILLS 90 tablet 0  . B-D UF III MINI PEN NEEDLES 31G X 5 MM MISC USE AS DIRECTED USE DAILY TO ADMINISTER VICTOZA  3  . fluticasone (FLONASE) 50 MCG/ACT nasal spray Place 2 sprays into both nostrils daily. 48 g 1  .  Fluticasone-Salmeterol (ADVAIR) 250-50 MCG/DOSE AEPB Inhale 1 puff into the lungs 2 (two) times daily as needed.     . Insulin Glargine (BASAGLAR KWIKPEN) 100 UNIT/ML SOPN Inject 30 Units into the skin daily at 10 pm.     . levothyroxine (SYNTHROID, LEVOTHROID) 150 MCG tablet TAKE 1 TABLET EVERY MORNING WITH A FULL GLASS  OF WATER ON AN EMPTY STOMACH 90 tablet 0  . montelukast (SINGULAIR) 10 MG tablet Take 10 mg by mouth at bedtime.    Glory Rosebush VERIO test strip USE AS DIRECTED TO CHECK FASTING BLOOD SUGARS TWICE DAILY.  3   No current facility-administered medications on file prior to visit.     BP 126/72   Pulse 71   Temp 98 F (36.7 C) (Oral)   Ht 5\' 5"  (1.651 m)   Wt 194 lb (88 kg)   SpO2 96%   BMI 32.28 kg/m    Objective:   Physical Exam  Constitutional: She appears well-nourished.  Neck: Neck supple.  Cardiovascular: Normal rate and regular rhythm.  Respiratory: Effort normal and breath sounds normal.  Skin: Skin is warm and dry.           Assessment & Plan:

## 2017-12-09 NOTE — Assessment & Plan Note (Signed)
Repeat lipids pending. Will send refills of atorvastatin once labs received.

## 2017-12-10 ENCOUNTER — Other Ambulatory Visit: Payer: Self-pay | Admitting: Primary Care

## 2017-12-10 DIAGNOSIS — E039 Hypothyroidism, unspecified: Secondary | ICD-10-CM

## 2017-12-10 DIAGNOSIS — E785 Hyperlipidemia, unspecified: Secondary | ICD-10-CM

## 2017-12-10 LAB — COMPREHENSIVE METABOLIC PANEL
ALT: 13 U/L (ref 0–35)
AST: 12 U/L (ref 0–37)
Albumin: 4.2 g/dL (ref 3.5–5.2)
Alkaline Phosphatase: 110 U/L (ref 39–117)
BUN: 13 mg/dL (ref 6–23)
CO2: 29 mEq/L (ref 19–32)
Calcium: 9.9 mg/dL (ref 8.4–10.5)
Chloride: 95 mEq/L — ABNORMAL LOW (ref 96–112)
Creatinine, Ser: 0.87 mg/dL (ref 0.40–1.20)
GFR: 68.44 mL/min (ref >60.00)
Glucose, Bld: 316 mg/dL — ABNORMAL HIGH (ref 70–99)
Potassium: 4.4 mEq/L (ref 3.5–5.1)
Sodium: 132 mEq/L — ABNORMAL LOW (ref 135–145)
Total Bilirubin: 0.3 mg/dL (ref 0.2–1.2)
Total Protein: 7 g/dL (ref 6.0–8.3)

## 2017-12-10 LAB — LIPID PANEL
Cholesterol: 228 mg/dL — ABNORMAL HIGH (ref 0–200)
HDL: 71.9 mg/dL (ref >39.00)
LDL Cholesterol: 132 mg/dL — ABNORMAL HIGH (ref 0–99)
NonHDL: 156.37
Total CHOL/HDL Ratio: 3
Triglycerides: 121 mg/dL (ref 0.0–149.0)
VLDL: 24.2 mg/dL (ref 0.0–40.0)

## 2017-12-10 LAB — TSH: TSH: 13.9 u[IU]/mL — ABNORMAL HIGH (ref 0.35–4.50)

## 2017-12-10 LAB — HEMOGLOBIN A1C: Hgb A1c MFr Bld: 11.3 % — ABNORMAL HIGH (ref 4.6–6.5)

## 2017-12-10 MED ORDER — ATORVASTATIN CALCIUM 20 MG PO TABS: ORAL_TABLET | ORAL | 3 refills | Status: DC

## 2017-12-10 MED ORDER — LEVOTHYROXINE SODIUM 150 MCG PO TABS: ORAL_TABLET | ORAL | 1 refills | Status: DC

## 2017-12-14 ENCOUNTER — Encounter: Payer: Self-pay | Admitting: *Deleted

## 2017-12-30 ENCOUNTER — Telehealth: Payer: Self-pay | Admitting: Oncology

## 2017-12-30 ENCOUNTER — Other Ambulatory Visit: Payer: Self-pay | Admitting: Oncology

## 2017-12-30 NOTE — Progress Notes (Signed)
We received a call that this patient with a history of atypical ductal hyperplasia is now interested in trying anastrozole and possibly intensified screening with MRI.  I have scheduled her to see me 01/08/2018.

## 2017-12-30 NOTE — Telephone Encounter (Signed)
Appt scheduled patient notified per 10/16 staff message

## 2018-01-05 ENCOUNTER — Telehealth: Payer: Self-pay | Admitting: Oncology

## 2018-01-05 ENCOUNTER — Encounter: Payer: Self-pay | Admitting: Oncology

## 2018-01-05 ENCOUNTER — Inpatient Hospital Stay: Payer: BLUE CROSS/BLUE SHIELD | Attending: Genetic Counselor | Admitting: Oncology

## 2018-01-05 VITALS — BP 108/55 | HR 84 | Temp 98.3°F | Resp 18 | Ht 65.0 in | Wt 193.3 lb

## 2018-01-05 DIAGNOSIS — Z803 Family history of malignant neoplasm of breast: Secondary | ICD-10-CM | POA: Diagnosis not present

## 2018-01-05 DIAGNOSIS — Z79811 Long term (current) use of aromatase inhibitors: Secondary | ICD-10-CM

## 2018-01-05 DIAGNOSIS — N6091 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: Secondary | ICD-10-CM | POA: Diagnosis not present

## 2018-01-05 DIAGNOSIS — G4733 Obstructive sleep apnea (adult) (pediatric): Secondary | ICD-10-CM | POA: Diagnosis not present

## 2018-01-05 DIAGNOSIS — Z800 Family history of malignant neoplasm of digestive organs: Secondary | ICD-10-CM

## 2018-01-05 DIAGNOSIS — Z794 Long term (current) use of insulin: Secondary | ICD-10-CM | POA: Diagnosis not present

## 2018-01-05 DIAGNOSIS — E039 Hypothyroidism, unspecified: Secondary | ICD-10-CM

## 2018-01-05 DIAGNOSIS — M5126 Other intervertebral disc displacement, lumbar region: Secondary | ICD-10-CM

## 2018-01-05 DIAGNOSIS — E119 Type 2 diabetes mellitus without complications: Secondary | ICD-10-CM | POA: Diagnosis not present

## 2018-01-05 DIAGNOSIS — E7800 Pure hypercholesterolemia, unspecified: Secondary | ICD-10-CM

## 2018-01-05 DIAGNOSIS — M1990 Unspecified osteoarthritis, unspecified site: Secondary | ICD-10-CM

## 2018-01-05 DIAGNOSIS — Z87891 Personal history of nicotine dependence: Secondary | ICD-10-CM | POA: Diagnosis not present

## 2018-01-05 DIAGNOSIS — Z79899 Other long term (current) drug therapy: Secondary | ICD-10-CM

## 2018-01-05 DIAGNOSIS — Z1379 Encounter for other screening for genetic and chromosomal anomalies: Secondary | ICD-10-CM

## 2018-01-05 DIAGNOSIS — Z780 Asymptomatic menopausal state: Secondary | ICD-10-CM | POA: Diagnosis not present

## 2018-01-05 MED ORDER — ANASTROZOLE 1 MG PO TABS: 1.0000 mg | ORAL_TABLET | Freq: Every day | ORAL | 4 refills | Status: DC

## 2018-01-05 NOTE — Progress Notes (Signed)
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 She underwent unilateral right diagnostic mammography with tomography [facility] on 06/25/2017 showing: breast density category B. There were multiple scattered groups of suspicious fine pleomorphic and amorphous calcifications in the right breast.  Accordingly on 06/26/2017 she proceeded to biopsy of the right breast area in question. The pathology from this procedure showed ([id number]): Atypical ductal hyperplasia in the posterior and anterior regions and Atypical lobular hyperplasia in the posterior brest  She underwent right lumpectomy of these two areas ([id number]) on 08/12/2017 with pathology showing: In the right inferior retroaerolar area and right upper inner quadrant, there was  fibrocystic changes with calcifications with no evidence of malignancy.  The patient's subsequent history is as detailed below.  INTERVAL HISTORY: Adiva presents to the office today for follow up of her right breast atypical ductal hyperplasia and atypical lobular hyperplasia. She is doing well overall.  Since the last visit here she underwent colonoscopy under Dr. Simona Huh.  She was  disturbed that there was a polyp, although no high-grade dysplasia was identified.  She will be undergoing repeat colonoscopy in 5 years.  Since last visit here she also completed her genetics testing, which thankfully was negative.  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 She denies unusual headaches, visual changes, nausea, vomiting, or dizziness. There has been no unusual cough, phlegm production, or pleurisy. This been no change in bowel or bladder habits. She denies unexplained fatigue or unexplained weight loss, bleeding, rash, or fever. A detailed review of systems was otherwise stable.    PAST MEDICAL HISTORY: Past Medical History:  Diagnosis Date  . Allergic rhinitis   . Allergy   . Asthma   . Atypical ductal hyperplasia of right breast   . 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 Breast cancer ([place])   . Diabetes mellitus without complication ([place])   . Family history of breast cancer   . Family history of colon cancer   . Hypercholesteremia   . Hypothyroidism   . Lumbar herniated disc   . OSA (obstructive sleep apnea)    used to use CPAP, not any more over 1 1/2 yrs  . Sleep apnea    no cpap in 2 yrs   . Vitamin D deficiency     PAST SURGICAL HISTORY: Past Surgical History:  Procedure Laterality Date  . APPENDECTOMY    . BREAST LUMPECTOMY WITH RADIOACTIVE SEED LOCALIZATION Right 08/12/2017   Procedure: RIGHT BREAST LUMPECTOMY X'S 2 WITH RADIOACTIVE SEED LOCALIZATION X'S 2;  Surgeon: Fanny Skates, MD;  Location: [facility];  Service: General;  Laterality: Right;  . COLONOSCOPY     11  yrs ago in [place] but given a 10 yr recall per pt   . GANGLION CYST EXCISION     72 years old  . INCISION AND DRAINAGE ABSCESS ANAL    . KNEE SURGERY  2014  . SHOULDER  SURGERY  1976    FAMILY HISTORY Family History  Problem Relation Age of Onset  . Alzheimer's disease Mother   . Asthma Mother   . Dementia Mother        cause of death  . Arthritis Father   . Heart attack Father 86       cause of death  . Breast cancer Sister 78  . Colon cancer Paternal Grandmother   . Liver cancer Maternal Uncle   . Colon polyps Neg Hx   . Rectal cancer Neg Hx   . Stomach cancer Neg Hx   The patient's mother, Molly Wall, died at age 20 in 09/22/17 with dementia. The patient's father, Molly Wall, died at age 19 due to heart attack and other heart issues. The patient had 1 brother and 2 sisters. One of the patient's sisters died of breast cancer at age 53 and was diagnosed at age 110. There was a maternal uncle who died of liver cancer. There was a paternal grandmother who had colon cancer.   GYNECOLOGIC HISTORY:  No LMP recorded. Patient is postmenopausal. Menarche: 72 years old Age at first live birth: 72 years old She is [place] P2. Her LMP was around age 12. She took HRT for a few months (less than a year).    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  She is divorced. She lives alone, with no pets. The patient's daughter Molly Wall is age 43 and lives in [place], [place] as a homemaker and home schools her children. Molly Wall has a Conservator, museum/gallery in [employer] in education and domestic violence. The patient's daughter Molly Wall is age 20, lives in [place], and also home schools her children. The patient has 4 grand children.  She attends Advertising account executive, where her son-in-law is one of the pastors.     ADVANCED DIRECTIVES: in place 12/10/2017, son-in-law Levada Dy named [place]: Social History   Tobacco Use  . Smoking status: Former Smoker    Types: Cigarettes  . Smokeless tobacco: Never Used  Substance Use Topics  . Alcohol use: Yes    Wall: 2 per  month  . Drug use: No     Colonoscopy: 09/15/2017 / Dr. Loletha Carrow / found small/large diverticula and polyp removal.  PAP:   Bone density: remote/ normal  Lipid panel: 12/09/2017, worsened  Allergies  Allergen Reactions  . Iodine Anaphylaxis    Radioactive iodine dye    Current Outpatient Medications  Medication Sig Dispense Refill  . albuterol (PROAIR HFA) 108 (90 Base) MCG/ACT inhaler Inhale 2 puffs into the lungs every 6 (six) hours as needed for wheezing or shortness of breath. NEED APPOINTMENT FOR ANY MORE REFILLS 18 g 0  . anastrozole (ARIMIDEX) 1 MG tablet Take 1 tablet (1 mg total) by mouth daily. 90 tablet 4  . atorvastatin (LIPITOR) 20 MG tablet Take 1 tablet by mouth daily for cholesterol. 90 tablet 3  . B-D UF III MINI PEN NEEDLES 31G X 5 MM MISC USE AS DIRECTED USE DAILY TO ADMINISTER VICTOZA  3  . fluticasone (FLONASE) 50 MCG/ACT nasal spray Place 2 sprays into both nostrils daily.  48 g 1  . Fluticasone-Salmeterol (ADVAIR) 250-50 MCG/DOSE AEPB Inhale 1 puff into the lungs 2 (two) times daily as needed.     . Insulin Glargine (BASAGLAR KWIKPEN) 100 UNIT/ML SOPN Inject 34 Units into the skin daily at 10 pm.    . levothyroxine (SYNTHROID, LEVOTHROID) 150 MCG tablet TAKE 1 TABLET EVERY MORNING WITH A FULL GLASS OF WATER ON AN EMPTY STOMACH. No food or medications for 30 minutes. 30 tablet 1  . metformin (FORTAMET) 1000 MG (OSM) 24 hr tablet Take 1 tablet (1,000 mg total) by mouth daily with breakfast. 90 tablet 3  . montelukast (SINGULAIR) 10 MG tablet Take 10 mg by mouth at bedtime.    Glory Rosebush VERIO test strip USE AS DIRECTED TO CHECK FASTING BLOOD SUGARS TWICE DAILY.  3  . VICTOZA 18 MG/3ML SOPN Inject 0.3 mLs (1.8 mg total) into the skin daily. 3 mL 5   No current facility-administered medications for this visit.     OBJECTIVE: Middle-aged white woman in no acute distress  Vitals:   01/05/18 1535  BP: (!) 108/55  Pulse: 84  Resp: 18  Temp: 98.3 F (36.8 C)  SpO2:  98%     Body mass index is 32.17 kg/m.   Wt Readings from Last 3 Encounters:  01/05/18 193 lb 4.8 oz (87.7 kg)  12/09/17 194 lb (88 kg)  11/06/17 191 lb (86.6 kg)      ECOG FS:0 - Asymptomatic  Sclerae unicteric, EOMs intact Oropharynx clear and moist No cervical or supraclavicular adenopathy Lungs no rales or rhonchi Heart regular rate and rhythm Abd soft, nontender, positive bowel sounds MSK no focal spinal tenderness, no upper extremity lymphedema Neuro: nonfocal, well oriented, appropriate affect Breasts: The right breast is status post lumpectomy, with no suspicious findings.  The cosmetic result is very good.  The left breast is benign.  Both axillae are benign.  LAB RESULTS:  CMP     Component Value Date/Time   NA 132 (L) 12/09/2017 1446   K 4.4 12/09/2017 1446   CL 95 (L) 12/09/2017 1446   CO2 29 12/09/2017 1446   GLUCOSE 316 (H) 12/09/2017 1446   BUN 13 12/09/2017 1446   CREATININE 0.87 12/09/2017 1446   CALCIUM 9.9 12/09/2017 1446   PROT 7.0 12/09/2017 1446   ALBUMIN 4.2 12/09/2017 1446   AST 12 12/09/2017 1446   ALT 13 12/09/2017 1446   ALKPHOS 110 12/09/2017 1446   BILITOT 0.3 12/09/2017 1446   GFRNONAA >60 08/06/2017 1200   GFRAA >60 08/06/2017 1200    No results found for: TOTALPROTELP, ALBUMINELP, A1GS, A2GS, BETS, BETA2SER, GAMS, MSPIKE, SPEI  No results found for: KPAFRELGTCHN, LAMBDASER, KAPLAMBRATIO  Lab Results  Component Value Date   WBC 4.8 08/06/2017   NEUTROABS 2.9 08/06/2017   HGB 14.4 08/06/2017   HCT 45.8 08/06/2017   MCV 85.0 08/06/2017   PLT 230 08/06/2017    '@LASTCHEMISTRY' @  No results found for: LABCA2  No components found for: EXNTZG017  No results for input(s): INR in the last 168 hours.  No results found for: LABCA2  No results found for: CBS496  No results found for: PRF163  No results found for: WGY659  No results found for: CA2729  No components found for: HGQUANT  No results found for: CEA1 / No  results found for: CEA1   No results found for: AFPTUMOR  No results found for: [place]  No results found for: PSA1  No visits with results within 3 Day(s)  from this visit.  Latest known visit with results is:  Office Visit on 12/09/2017  Component Date Value Ref Range Status  . Sodium 12/09/2017 132* 135 - 145 mEq/L Final  . Potassium 12/09/2017 4.4  3.5 - 5.1 mEq/L Final  . Chloride 12/09/2017 95* 96 - 112 mEq/L Final  . CO2 12/09/2017 29  19 - 32 mEq/L Final  . Glucose, Bld 12/09/2017 316* 70 - 99 mg/dL Final  . BUN 12/09/2017 13  6 - 23 mg/dL Final  . Creatinine, Ser 12/09/2017 0.87  0.40 - 1.20 mg/dL Final  . Total Bilirubin 12/09/2017 0.3  0.2 - 1.2 mg/dL Final  . Alkaline Phosphatase 12/09/2017 110  39 - 117 U/L Final  . AST 12/09/2017 12  0 - 37 U/L Final  . ALT 12/09/2017 13  0 - 35 U/L Final  . Total Protein 12/09/2017 7.0  6.0 - 8.3 g/dL Final  . Albumin 12/09/2017 4.2  3.5 - 5.2 g/dL Final  . Calcium 12/09/2017 9.9  8.4 - 10.5 mg/dL Final  . GFR 12/09/2017 68.44  >60.00 mL/min Final  . Cholesterol 12/09/2017 228* 0 - 200 mg/dL Final   ATP III Classification       Desirable:  < 200 mg/dL               Borderline High:  200 - 239 mg/dL          High:  > = 240 mg/dL  . Triglycerides 12/09/2017 121.0  0.0 - 149.0 mg/dL Final   Normal:  <150 mg/dLBorderline High:  150 - 199 mg/dL  . HDL 12/09/2017 71.90  >39.00 mg/dL Final  . VLDL 12/09/2017 24.2  0.0 - 40.0 mg/dL Final  . LDL Cholesterol 12/09/2017 132* 0 - 99 mg/dL Final  . Total CHOL/HDL Ratio 12/09/2017 3   Final                  Men          Women1/2 Average Risk     3.4          3.3Average Risk          5.0          4.42X Average Risk          9.6          7.13X Average Risk          15.0          11.0                      . NonHDL 12/09/2017 156.37   Final   NOTE:  Non-HDL goal should be 30 mg/dL higher than patient's LDL goal (i.e. LDL goal of < 70 mg/dL, would have non-HDL goal of < 100 mg/dL)  . Hgb A1c MFr  Bld 12/09/2017 11.3* 4.6 - 6.5 % Final   Glycemic Control Guidelines for People with Diabetes:Non Diabetic:  <6%Goal of Therapy: <7%Additional Action Suggested:  >8%   . TSH 12/09/2017 13.90* 0.35 - 4.50 uIU/mL Final    (this displays the last labs from the last 3 days)  No results found for: TOTALPROTELP, ALBUMINELP, A1GS, A2GS, BETS, BETA2SER, GAMS, MSPIKE, SPEI (this displays SPEP labs)  No results found for: KPAFRELGTCHN, LAMBDASER, KAPLAMBRATIO (kappa/lambda light chains)  No results found for: HGBA, HGBA2QUANT, HGBFQUANT, HGBSQUAN (Hemoglobinopathy evaluation)   No results found for: LDH  No results found for: IRON, TIBC, IRONPCTSAT (Iron and TIBC)  No results found  for: FERRITIN  Urinalysis No results found for: COLORURINE, APPEARANCEUR, LABSPEC, PHURINE, GLUCOSEU, HGBUR, BILIRUBINUR, KETONESUR, PROTEINUR, UROBILINOGEN, NITRITE, LEUKOCYTESUR   STUDIES: No results found.  ELIGIBLE FOR AVAILABLE RESEARCH PROTOCOL: no  ASSESSMENT: 72 y.o. [place], [place] woman status post right breast biopsy x2 06/26/2017 showing atypical ductal hyperplasia and atypical lobular hyperplasia  (1) right lumpectomy x2 on 08/12/2017 shows no evidence of malignancy at either site  (2) genetics testing 11/23/2017 through the Hereditary Gene Panel offered by [facility] found no deleterious mutations in APC, ATM, AXIN2, BARD1, BMPR1A, BRCA1, BRCA2, BRIP1, CDH1, CDK4, CDKN2A (p14ARF), CDKN2A (p16INK4a), CHEK2, CTNNA1, DICER1, EPCAM (Deletion/duplication testing only), GREM1 (promoter region deletion/duplication testing only), KIT, MEN1, MLH1, MSH2, MSH3, MSH6, MUTYH, NBN, NF1, NHTL1, PALB2, PDGFRA, PMS2, POLD1, POLE, PTEN, RAD50, RAD51C, RAD51D, SDHB, SDHC, SDHD, SMAD4, SMARCA4. STK11, TP53, TSC1, TSC2, and VHL.  The following genes were evaluated for sequence changes only: SDHA and HOXB13 c.251G>A variant only.  (3) breast cancer high risk: intensified screening:  (a) yearly mammography in May  (b) yearly  breast MRI in November  (c) biannual MD breast exam  (4) breast cancer high risk: risk reduction  (a) anastrozole started 01/05/2018  PLAN: I spent approximately 30 minutes face to face with Vaughan Basta with more than 50% of that time spent in counseling and coordination of care.  We reviewed her breast cancer risk again and again discussed intensified screening issues as well as risk reduction issues.  At this point she is interested in implementing these.  We are going to obtain a breast MRI in November of this year which will be 6 months from her mammogram.  She has a good understanding of the possible cost issues as well as the possible false positive issues.  As far as risk reduction is concerned we discussed the difference between tamoxifen and anastrozole and we are going to give anastrozole a try.  She has a good understanding of the possible toxicity side effects and complications of this agent.  I have made her an appointment with me in January in case she has difficulty with the anastrozole and wishes to discuss it further, discontinue, or change to tamoxifen.  If all is going well she will simply cancel that appointment and see me again next October  We also reviewed her diabetes.  She is not on an exercise program now but I gave her information on the trails to recovery program.  We also discussed diet issues in detail  She knows to call for any other problems that may develop before the next visit here.   Magrinat, Virgie Dad, MD  01/05/18 4:14 PM Medical Oncology and Hematology [facility] [address] [place], [place] [address] Tel. [phone]    Fax. [phone]  IWilburn Mylar, am acting as scribe for Chauncey Cruel MD.  I, Lurline Del MD, have reviewed the above documentation for accuracy and completeness, and I agree with the above.

## 2018-01-05 NOTE — Telephone Encounter (Signed)
Gave avs and calendar ° °

## 2018-01-08 ENCOUNTER — Ambulatory Visit: Payer: BLUE CROSS/BLUE SHIELD | Admitting: Oncology

## 2018-01-18 ENCOUNTER — Ambulatory Visit (INDEPENDENT_AMBULATORY_CARE_PROVIDER_SITE_OTHER): Payer: BLUE CROSS/BLUE SHIELD | Admitting: Primary Care

## 2018-01-18 ENCOUNTER — Other Ambulatory Visit (HOSPITAL_COMMUNITY)
Admission: RE | Admit: 2018-01-18 | Discharge: 2018-01-18 | Disposition: A | Discharge status: Home / Self Care | Payer: BLUE CROSS/BLUE SHIELD | Source: Ambulatory Visit | Attending: Primary Care | Admitting: Primary Care

## 2018-01-18 ENCOUNTER — Encounter: Payer: Self-pay | Admitting: Primary Care

## 2018-01-18 VITALS — BP 120/76 | HR 71 | Temp 98.2°F | Ht 65.0 in | Wt 190.2 lb

## 2018-01-18 DIAGNOSIS — Z124 Encounter for screening for malignant neoplasm of cervix: Secondary | ICD-10-CM | POA: Diagnosis not present

## 2018-01-18 NOTE — Progress Notes (Signed)
Subjective:    Patient ID: Molly Wall, female    DOB: 1945-09-19, 72 y.o.   MRN: [id number]  HPI  Ms. Wall is a 72 year old female who presents today for pap smear.   She was told by her oncologist to undergo pap smear due to right breast atypical ductal hyperplasia and atypical lobular hyperplasia that was found after breast biopsy earlier this year. She's also undergone colonoscopy, one polyp without high grade dysplasia. She's also undergone genetic testing which was negative.   She will undergo breast MRI in November 2019. She is now taking anastrozole. Her last pap smear was 25 years ago, negative.   Review of Systems  Cardiovascular: Negative for chest pain.  Genitourinary: Negative for dysuria, genital sores, pelvic pain and vaginal discharge.       Past Medical History:  Diagnosis Date  . Allergic rhinitis   . Allergy   . Asthma   . Atypical ductal hyperplasia of right breast   . 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 Breast cancer ([place])   . Diabetes mellitus without complication ([place])   . Family history of breast cancer   . Family history of colon cancer   . Hypercholesteremia   . Hypothyroidism   . Lumbar herniated disc   . OSA (obstructive sleep apnea)    used to use CPAP, not any more over 1 1/2 yrs  . Sleep apnea    no cpap in 2 yrs   . Vitamin D deficiency      Social History   Socioeconomic History  . Marital status: Divorced    Spouse name: Not on file  . Number of children: 2  . Years of education: Not on file  . Highest education level: Not on file  Occupational History  . Not on file  Social Needs  . Financial resource strain: Not on file  . Food insecurity:    Worry: Not on file    Inability: Not on file  . Transportation needs:    Medical: Not on file    Non-medical: Not on file  Tobacco Use  . Smoking status: Former Smoker    Types: Cigarettes  . Smokeless tobacco: Never Used  Substance and Sexual Activity    . Alcohol use: Yes    Comment: 2 per month  . Drug use: No  . Sexual activity: Not Currently  Lifestyle  . Physical activity:    Days per week: Not on file    Minutes per session: Not on file  . Stress: Not on file  Relationships  . Social connections:    Talks on phone: Not on file    Gets together: Not on file    Attends religious service: Not on file    Active member of club or organization: Not on file    Attends meetings of clubs or organizations: Not on file    Relationship status: Not on file  . Intimate partner violence:    Fear of current or ex partner: Not on file    Emotionally abused: Not on file    Physically abused: Not on file    Forced sexual activity: Not on file  Other Topics Concern  . Not on file  Social History Narrative   Single.   Moved from [place] to [place] several weeks ago.   Family lives in [place].   Professor of sociology and psychology.   Enjoys spending time on the computer and teaching online, spending time  with her family.    Past Surgical History:  Procedure Laterality Date  . APPENDECTOMY    . BREAST LUMPECTOMY WITH RADIOACTIVE SEED LOCALIZATION Right 08/12/2017   Procedure: RIGHT BREAST LUMPECTOMY X'S 2 WITH RADIOACTIVE SEED LOCALIZATION X'S 2;  Surgeon: Fanny Skates, MD;  Location: [facility];  Service: General;  Laterality: Right;  . COLONOSCOPY     11 yrs ago in [place] but given a 10 yr recall per pt   . GANGLION CYST EXCISION     72 years old  . INCISION AND DRAINAGE ABSCESS ANAL    . KNEE SURGERY  2014  . SHOULDER SURGERY  1976    Family History  Problem Relation Age of Onset  . Alzheimer's disease Mother   . Asthma Mother   . Dementia Mother        cause of death  . Arthritis Father   . Heart attack Father 49       cause of death  . Breast cancer Sister 52  . Colon cancer Paternal Grandmother   . Liver cancer Maternal Uncle   . Colon polyps Neg Hx   . Rectal cancer Neg Hx   . Stomach  cancer Neg Hx     Allergies  Allergen Reactions  . Iodine Anaphylaxis    Radioactive iodine dye    Current Outpatient Medications on File Prior to Visit  Medication Sig Dispense Refill  . albuterol (PROAIR HFA) 108 (90 Base) MCG/ACT inhaler Inhale 2 puffs into the lungs every 6 (six) hours as needed for wheezing or shortness of breath. NEED APPOINTMENT FOR ANY MORE REFILLS 18 g 0  . anastrozole (ARIMIDEX) 1 MG tablet Take 1 tablet (1 mg total) by mouth daily. 90 tablet 4  . atorvastatin (LIPITOR) 20 MG tablet Take 1 tablet by mouth daily for cholesterol. 90 tablet 3  . B-D UF III MINI PEN NEEDLES 31G X 5 MM MISC USE AS DIRECTED USE DAILY TO ADMINISTER VICTOZA  3  . fluticasone (FLONASE) 50 MCG/ACT nasal spray Place 2 sprays into both nostrils daily. 48 g 1  . Fluticasone-Salmeterol (ADVAIR) 250-50 MCG/DOSE AEPB Inhale 1 puff into the lungs 2 (two) times daily as needed.     . Insulin Glargine (BASAGLAR KWIKPEN) 100 UNIT/ML SOPN Inject 34 Units into the skin daily at 10 pm.    . levothyroxine (SYNTHROID, LEVOTHROID) 150 MCG tablet TAKE 1 TABLET EVERY MORNING WITH A FULL GLASS OF WATER ON AN EMPTY STOMACH. No food or medications for 30 minutes. 30 tablet 1  . metformin (FORTAMET) 1000 MG (OSM) 24 hr tablet Take 1 tablet (1,000 mg total) by mouth daily with breakfast. 90 tablet 3  . montelukast (SINGULAIR) 10 MG tablet Take 10 mg by mouth at bedtime.    Glory Rosebush VERIO test strip USE AS DIRECTED TO CHECK FASTING BLOOD SUGARS TWICE DAILY.  3  . VICTOZA 18 MG/3ML SOPN Inject 0.3 mLs (1.8 mg total) into the skin daily. 3 mL 5   No current facility-administered medications on file prior to visit.     BP 120/76   Pulse 71   Temp 98.2 F (36.8 C) (Oral)   Ht 5\' 5"  (1.651 m)   Wt 190 lb 4 oz (86.3 kg)   SpO2 98%   BMI 31.66 kg/m    Objective:   Physical Exam  Constitutional: She appears well-nourished.  Cardiovascular: Normal rate.  Respiratory: Effort normal.  Skin: Skin is warm  and dry.  Assessment & Plan:  Screening for Cervical Cancer:  Nearly 72 year old female requesting pap smear given recent findings on mammogram and colonoscopy. She endorses this was recommended by her oncologist, however, I do not see evidence of this in his most recent note. Pap smear completed today, if negative wouldn't recommend further pap smears.  Exam unremarkable.  [name], NP

## 2018-01-18 NOTE — Patient Instructions (Signed)
We will be in touch once we receive your pap smear results.   It was a pleasure to see you today!

## 2018-01-19 LAB — CYTOLOGY - PAP
Diagnosis: NEGATIVE
HPV: NOT DETECTED

## 2018-01-29 ENCOUNTER — Ambulatory Visit: Payer: BLUE CROSS/BLUE SHIELD | Admitting: Oncology

## 2018-01-30 ENCOUNTER — Ambulatory Visit
Admission: RE | Admit: 2018-01-30 | Discharge: 2018-01-30 | Disposition: A | Discharge status: Home / Self Care | Payer: BLUE CROSS/BLUE SHIELD | Source: Ambulatory Visit | Attending: Oncology | Admitting: Oncology

## 2018-01-30 DIAGNOSIS — N6091 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: Secondary | ICD-10-CM | POA: Diagnosis not present

## 2018-01-30 DIAGNOSIS — Z803 Family history of malignant neoplasm of breast: Secondary | ICD-10-CM | POA: Diagnosis not present

## 2018-01-30 DIAGNOSIS — Z1379 Encounter for other screening for genetic and chromosomal anomalies: Secondary | ICD-10-CM

## 2018-01-30 MED ORDER — GADOBUTROL 1 MMOL/ML IV SOLN
9.0000 mL | Freq: Once | INTRAVENOUS | Status: AC | PRN
  Administered 2018-01-30: 9 mL via INTRAVENOUS

## 2018-02-04 ENCOUNTER — Other Ambulatory Visit: Payer: Self-pay | Admitting: Oncology

## 2018-02-04 DIAGNOSIS — E119 Type 2 diabetes mellitus without complications: Secondary | ICD-10-CM | POA: Diagnosis not present

## 2018-02-04 LAB — HM DIABETES EYE EXAM

## 2018-02-05 ENCOUNTER — Other Ambulatory Visit: Payer: Self-pay | Admitting: Primary Care

## 2018-02-05 DIAGNOSIS — E039 Hypothyroidism, unspecified: Secondary | ICD-10-CM

## 2018-02-08 ENCOUNTER — Encounter: Payer: Self-pay | Admitting: Primary Care

## 2018-02-09 ENCOUNTER — Telehealth: Payer: Self-pay | Admitting: *Deleted

## 2018-02-09 NOTE — Telephone Encounter (Signed)
This RN attempted to contact the pt to discuss results and recommended follow up. Obtained identified VM- message left to return call and request to speak to Dr Magrinat's nurse.  Will await return call.

## 2018-02-09 NOTE — Telephone Encounter (Signed)
-----   Message from Rockwell Germany, RN sent at 02/09/2018 12:25 PM EST ----- Regarding: FW: Breast MRI Recommendations Here ya go Val. ----- Message ----- From: Chauncey Cruel, MD Sent: 02/04/2018   2:57 PM EST To: Rockwell Germany, RN Subject: FW: Breast MRI Recommendations                 Varney Biles could you let the patient know we are ordering the suggested studies, then order them? It looks like we are going to have to do these for the "high risk" patients  Thanks!  GM ----- Message ----- From: Volanda Napoleon Sent: 02/02/2018   9:20 AM EST To: Rockwell Germany, RN, Bary Leriche, RN, # Subject: Breast MRI Recommendations                     Dear Dr. Jana Hakim, The above patient had a breast MRI on 01/30/18 and below are the recommendations of Radiologist, Dr. Lillia Mountain. The full report is in [facility].   Please note the patient has not been notified of the results.  Please notify the patient of any results then call, fax, or place any orders you feel are medically necessary. Once received we will have a scheduler reach out to patient.   RECOMMENDATION:  1. Patient's last mammogram was preoperative in April of 2019.  Bilateral diagnostic mammogram and ultrasound of both breast is  recommended.  2. If no abnormality is seen in the medial aspect of the right breast MR guided core biopsy would be recommended.  3. Clinical and sonographic evaluation of the left nipple and subareolar region is recommended. Punch biopsy of the skin may be  warranted.  Please let me know if you have any questions.  Thank you.  [facility] [place] [phone]

## 2018-02-09 NOTE — Telephone Encounter (Signed)
This RN returned VM left by pt post prior call today- again received identified VM - informed pt need for additional follow up and hopefully we can speak person to person for discussion of MD recommendations.  See prior note today per MD recommendations.

## 2018-02-16 ENCOUNTER — Other Ambulatory Visit: Payer: Self-pay | Admitting: *Deleted

## 2018-02-16 ENCOUNTER — Other Ambulatory Visit: Payer: Self-pay | Admitting: Oncology

## 2018-02-16 ENCOUNTER — Telehealth: Payer: Self-pay | Admitting: *Deleted

## 2018-02-16 DIAGNOSIS — N6091 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: Secondary | ICD-10-CM

## 2018-02-16 DIAGNOSIS — Z803 Family history of malignant neoplasm of breast: Secondary | ICD-10-CM

## 2018-02-16 MED ORDER — LORAZEPAM 1 MG PO TABS: ORAL_TABLET | ORAL | 1 refills | Status: DC

## 2018-02-16 NOTE — Telephone Encounter (Signed)
This RN was able to speak with per need for biopsy per MRI performed earlier this month as well as scheduled pt for appointment on 02/23/2018 at [facility] for MRI guided bx.  Procedure discussed with pt - including inquiry and concern per her statements regarding " having a little anxiety regarding having to have another MRI "  Molly Wall states she was not " claustrophobic " " just very uncomfortable having to lay on my stomach for so long and now thinking about having them do a biopsy while I lay there gets me anxious "  This RN discussed above- including prescription for anxiety can be sent to her pharmacy for use for MRI- with caveat that she has a driver due to sensorium changes.  Molly Wall stated her daughter would be able to drive her.  Appointment date,time and location as well as how to use the antianxiety medication reviewed with pt verbalizing back directions.  Pharmacy verified for prescription.  No further needs at this time- pt understands to call with any further concerns.

## 2018-02-23 ENCOUNTER — Ambulatory Visit
Admission: RE | Admit: 2018-02-23 | Discharge: 2018-02-23 | Disposition: A | Discharge status: Home / Self Care | Payer: BLUE CROSS/BLUE SHIELD | Source: Ambulatory Visit | Attending: Oncology | Admitting: Oncology

## 2018-02-23 ENCOUNTER — Other Ambulatory Visit: Payer: Self-pay | Admitting: Oncology

## 2018-02-23 ENCOUNTER — Other Ambulatory Visit (HOSPITAL_COMMUNITY): Payer: Self-pay | Admitting: Diagnostic Radiology

## 2018-02-23 DIAGNOSIS — Z803 Family history of malignant neoplasm of breast: Secondary | ICD-10-CM

## 2018-02-23 DIAGNOSIS — N6091 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: Secondary | ICD-10-CM

## 2018-02-23 DIAGNOSIS — N6011 Diffuse cystic mastopathy of right breast: Secondary | ICD-10-CM | POA: Diagnosis not present

## 2018-02-23 DIAGNOSIS — R928 Other abnormal and inconclusive findings on diagnostic imaging of breast: Secondary | ICD-10-CM | POA: Diagnosis not present

## 2018-02-23 MED ORDER — GADOBUTROL 1 MMOL/ML IV SOLN: 9.0000 mL | Freq: Once | INTRAVENOUS | Status: DC | PRN

## 2018-02-26 ENCOUNTER — Other Ambulatory Visit: Payer: Self-pay | Admitting: *Deleted

## 2018-03-02 ENCOUNTER — Telehealth: Payer: Self-pay | Admitting: *Deleted

## 2018-03-02 NOTE — Telephone Encounter (Signed)
Pt is awaiting to hear about plan per recent MRI showing different area of concern in there right breast- recommendation per [facility] is for punch biopsy by surgeon and [place] of noted area of concern.  Pt is awaiting to hear what she needs to do next- biopsy or [place].  This note will be sent to the MD for further review and recommendation.

## 2018-03-03 ENCOUNTER — Other Ambulatory Visit: Payer: Self-pay | Admitting: Oncology

## 2018-03-08 ENCOUNTER — Other Ambulatory Visit: Payer: Self-pay | Admitting: Oncology

## 2018-03-15 ENCOUNTER — Other Ambulatory Visit: Payer: Self-pay | Admitting: Primary Care

## 2018-03-15 DIAGNOSIS — E039 Hypothyroidism, unspecified: Secondary | ICD-10-CM

## 2018-03-18 DIAGNOSIS — E119 Type 2 diabetes mellitus without complications: Secondary | ICD-10-CM | POA: Diagnosis not present

## 2018-03-18 DIAGNOSIS — N6091 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: Secondary | ICD-10-CM | POA: Diagnosis not present

## 2018-03-18 DIAGNOSIS — R928 Other abnormal and inconclusive findings on diagnostic imaging of breast: Secondary | ICD-10-CM | POA: Diagnosis not present

## 2018-03-18 DIAGNOSIS — Z803 Family history of malignant neoplasm of breast: Secondary | ICD-10-CM | POA: Diagnosis not present

## 2018-04-06 NOTE — Progress Notes (Signed)
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 She underwent unilateral right diagnostic mammography with tomography [facility] on 06/25/2017 showing: breast density category B. There were multiple scattered groups of suspicious fine pleomorphic and amorphous calcifications in the right breast.  Accordingly on 06/26/2017 she proceeded to biopsy of the right breast area in question. The pathology from this procedure showed ([id number]): Atypical ductal hyperplasia in the posterior and anterior regions and Atypical lobular hyperplasia in the posterior brest  She underwent right lumpectomy of these two areas ([id number]) on 08/12/2017 with pathology showing: In the right inferior retroaerolar area and right upper inner quadrant, there was  fibrocystic changes with calcifications with no evidence of malignancy.  The patient's subsequent history is as detailed below.   INTERVAL HISTORY: [name] returns today for follow-up and treatment of her right breast atypical ductal hyperplasia and atypical lobular hyperplasia.   She continues on anastrozole. She tolerates this well and without any noticeable side effects.    Molly Wall  does not have a bone density screening on file.  Her last breast MRI was on 01/30/2018 at [facility] showing Breast Density Category B. There is abnormal enhancement in the medial aspect of the right breast which may be secondary to fat necrosis but malignancy cannot be excluded. Asymmetric enhancement of the left nipple and subareolar region.  On 02/23/2018 she underwent a biopsy of the right breast. The pathology from this procedure showed [id number]):  Breast, right, needle core biopsy, inner   - fibrosis and prior resection site changes.   - fibrocystic change.   - no malignancy identified.   REVIEW OF SYSTEMS: Molly Wall says that she didn't hurt or bruise from her biopsy. She saw Dr. Dalbert Batman in 02/2018 and she will see him again in 06/2018. She states that she feels good overall. For exercise, she tries to walk around her neighborhood, but she also has a bike. She admits that she does not exercise as much as she should. "I keep telling myself I'll do it tomorrow." She has had some back discomfort due to her herniated disc. The patient denies unusual headaches, visual changes, nausea, vomiting, or dizziness. There has been no unusual cough, phlegm production, or pleurisy. This been no change in bowel or bladder habits. The patient denies unexplained fatigue or unexplained weight loss, bleeding, rash, or fever. A detailed review of systems was otherwise noncontributory.    PAST MEDICAL HISTORY: Past Medical History:  Diagnosis Date  . Allergic rhinitis   . Allergy   . Asthma   . Atypical ductal hyperplasia of right breast   . 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 Breast cancer ([place])   . Diabetes mellitus without complication ([place])   . Family history of breast cancer   . Family history of colon cancer   . Hypercholesteremia   .  Hypothyroidism   . Lumbar herniated disc   . OSA (obstructive sleep apnea)    used to use CPAP, not any more over 1 1/2 yrs  . Sleep  apnea    no cpap in 2 yrs   . Vitamin D deficiency     PAST SURGICAL HISTORY: Past Surgical History:  Procedure Laterality Date  . APPENDECTOMY    . BREAST LUMPECTOMY WITH RADIOACTIVE SEED LOCALIZATION Right 08/12/2017   Procedure: RIGHT BREAST LUMPECTOMY X'S 2 WITH RADIOACTIVE SEED LOCALIZATION X'S 2;  Surgeon: Fanny Skates, MD;  Location: [facility];  Service: General;  Laterality: Right;  . COLONOSCOPY     11 yrs ago in [place] but given a 10 yr recall per pt   . GANGLION CYST EXCISION     73 years old  . INCISION AND DRAINAGE ABSCESS ANAL    . KNEE SURGERY  2014  . SHOULDER SURGERY  1976    FAMILY HISTORY Family History  Problem Relation Age of Onset  . Alzheimer's disease Mother   . Asthma Mother   . Dementia Mother        cause of death  . Arthritis Father   . Heart attack Father 103       cause of death  . Breast cancer Sister 50  . Colon cancer Paternal Grandmother   . Liver cancer Maternal Uncle   . Colon polyps Neg Hx   . Rectal cancer Neg Hx   . Stomach cancer Neg Hx    The patient's mother, Molly Wall, died at age 20 in 2017/10/24 with dementia. The patient's father, Molly Wall, died at age 66 due to heart attack and other heart issues. The patient had 1 brother and 2 sisters. One of the patient's sisters died of breast cancer at age 63 and was diagnosed at age 83. There was a maternal uncle who died of liver cancer. There was a paternal grandmother who had colon cancer.    GYNECOLOGIC HISTORY:  No LMP recorded. Patient is postmenopausal. Menarche: 73 years old Age at first live birth: 73 years old She is [place] P2. Her LMP was around age 45. She took HRT for a few months (less than a year).    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  She is divorced. She lives alone, with no pets. The patient's daughter Molly Wall is age 73 and lives in [place], [place] as a homemaker and home schools her  children. Molly Wall has a Conservator, museum/gallery in [employer] in education and domestic violence. The patient's daughter Molly Wall is age 52, lives in [place], and also home schools her children. The patient has 4 grand children.  She attends Advertising account [employer], where her son-in-law is one of the pastors.    ADVANCED DIRECTIVES: in place 12/10/2017, son-in-law Levada Dy named [place]: Social History   Tobacco Use  . Smoking status: Former Smoker    Types: Cigarettes  . Smokeless tobacco: Never Used  Substance Use Topics  . Alcohol use: Yes    Wall: 2 per month  . Drug use: No     Colonoscopy: 09/15/2017 / Dr. Loletha Carrow / found small/large diverticula and polyp removal.  PAP:   Bone density: remote/ normal  Lipid panel: 12/09/2017, worsened  Allergies  Allergen Reactions  . Iodine Anaphylaxis    Radioactive iodine dye    Current Outpatient Medications  Medication  Sig Dispense Refill  . albuterol (PROAIR HFA) 108 (90 Base) MCG/ACT inhaler Inhale 2 puffs into the lungs every 6 (six) hours as needed for wheezing or shortness of breath. NEED APPOINTMENT FOR ANY MORE REFILLS 18 g 0  . anastrozole (ARIMIDEX) 1 MG tablet Take 1 tablet (1 mg total) by mouth daily. 90 tablet 4  . atorvastatin (LIPITOR) 20 MG tablet Take 1 tablet by mouth daily for cholesterol. 90 tablet 3  . B-D UF III MINI PEN NEEDLES 31G X 5 MM MISC USE AS DIRECTED USE DAILY TO ADMINISTER VICTOZA  3  . fluticasone (FLONASE) 50 MCG/ACT nasal spray Place 2 sprays into both nostrils daily. 48 g 1  . Fluticasone-Salmeterol (ADVAIR) 250-50 MCG/DOSE AEPB Inhale 1 puff into the lungs 2 (two) times daily as needed.     . Insulin Glargine (BASAGLAR KWIKPEN) 100 UNIT/ML SOPN Inject 34 Units into the skin daily at 10 pm.    . levothyroxine (SYNTHROID, LEVOTHROID) 150 MCG tablet TAKE 1 TAB EVERY MORNING W/ A FULL GLASS OF WATER ON EMPTY STOMACH. NO FOOD OR MEDS FOR 30 MINS 30  tablet 0  . LORazepam (ATIVAN) 1 MG tablet Take 1 tablet 1 hour prior to MRI and may repeat if needed at time of MRI 3 tablet 1  . metformin (FORTAMET) 1000 MG (OSM) 24 hr tablet Take 1 tablet (1,000 mg total) by mouth daily with breakfast. 90 tablet 3  . montelukast (SINGULAIR) 10 MG tablet Take 10 mg by mouth at bedtime.    Glory Rosebush VERIO test strip USE AS DIRECTED TO CHECK FASTING BLOOD SUGARS TWICE DAILY.  3  . VICTOZA 18 MG/3ML SOPN Inject 0.3 mLs (1.8 mg total) into the skin daily. 3 mL 5   No current facility-administered medications for this visit.     OBJECTIVE: Middle-aged white woman who appears stated age  31:   04/07/18 1305  BP: (!) 103/59  Pulse: 98  Resp: 18  Temp: 98.2 F (36.8 C)  SpO2: 97%     Body mass index is 31.14 kg/m.   Wt Readings from Last 3 Encounters:  04/07/18 187 lb 1.6 oz (84.9 kg)  01/18/18 190 lb 4 oz (86.3 kg)  01/05/18 193 lb 4.8 oz (87.7 kg)      ECOG FS:1 - Symptomatic but completely ambulatory  Sclerae unicteric, pupils round and equal No cervical or supraclavicular adenopathy Lungs no rales or rhonchi Heart regular rate and rhythm Abd soft, nontender, positive bowel sounds MSK no focal spinal tenderness, no upper extremity lymphedema Neuro: nonfocal, well oriented, appropriate affect Breasts: The right breast is status post lumpectomy.  There are no findings of concern.  The left breast is benign.  Both axillae are benign  LAB RESULTS:  CMP     Component Value Date/Time   NA 132 (L) 12/09/2017 1446   K 4.4 12/09/2017 1446   CL 95 (L) 12/09/2017 1446   CO2 29 12/09/2017 1446   GLUCOSE 316 (H) 12/09/2017 1446   BUN 13 12/09/2017 1446   CREATININE 0.87 12/09/2017 1446   CALCIUM 9.9 12/09/2017 1446   PROT 7.0 12/09/2017 1446   ALBUMIN 4.2 12/09/2017 1446   AST 12 12/09/2017 1446   ALT 13 12/09/2017 1446   ALKPHOS 110 12/09/2017 1446   BILITOT 0.3 12/09/2017 1446   GFRNONAA >60 08/06/2017 1200   GFRAA >60 08/06/2017  1200    No results found for: TOTALPROTELP, ALBUMINELP, A1GS, A2GS, BETS, BETA2SER, GAMS, MSPIKE, SPEI  No results found for:  KPAFRELGTCHN, LAMBDASER, [facility]  Lab Results  Component Value Date   WBC 4.8 08/06/2017   NEUTROABS 2.9 08/06/2017   HGB 14.4 08/06/2017   HCT 45.8 08/06/2017   MCV 85.0 08/06/2017   PLT 230 08/06/2017    _0 @  No results found for: LABCA2  No components found for: [id number]  No results for input(s): INR in the last 168 hours.  No results found for: LABCA2  No results found for: LPF790  No results found for: WIO973  No results found for: ZHG992  No results found for: CA2729  No components found for: HGQUANT  No results found for: CEA1 / No results found for: CEA1   No results found for: AFPTUMOR  No results found for: CHROMOGRNA  No results found for: PSA1  No visits with results within 3 Day(s) from this visit.  Latest known visit with results is:  Abstract on 02/08/2018  Component Date Value Ref Range Status  . HM Diabetic Eye Exam 02/04/2018 No Retinopathy  No Retinopathy Final    (this displays the last labs from the last 3 days)  No results found for: TOTALPROTELP, ALBUMINELP, A1GS, A2GS, BETS, BETA2SER, GAMS, MSPIKE, SPEI (this displays SPEP labs)  No results found for: KPAFRELGTCHN, LAMBDASER, KAPLAMBRATIO (kappa/lambda light chains)  No results found for: HGBA, HGBA2QUANT, HGBFQUANT, HGBSQUAN (Hemoglobinopathy evaluation)   No results found for: LDH  No results found for: IRON, TIBC, IRONPCTSAT (Iron and TIBC)  No results found for: FERRITIN  Urinalysis No results found for: COLORURINE, APPEARANCEUR, LABSPEC, PHURINE, GLUCOSEU, HGBUR, BILIRUBINUR, KETONESUR, PROTEINUR, UROBILINOGEN, NITRITE, LEUKOCYTESUR   STUDIES: MRI results discussed with the patient and copy of pathology report given to the patient  ELIGIBLE FOR AVAILABLE RESEARCH PROTOCOL: no   ASSESSMENT: 74 y.o. [place], [employer] woman  status post right breast biopsy x2 06/26/2017 showing atypical ductal hyperplasia and atypical lobular hyperplasia  (1) right lumpectomy x2 on 08/12/2017 shows no evidence of malignancy at either site  (2) genetics testing 11/23/2017 through the Hereditary Gene Panel offered by [facility] found no deleterious mutations in APC, ATM, AXIN2, BARD1, BMPR1A, BRCA1, BRCA2, BRIP1, CDH1, CDK4, CDKN2A (p14ARF), CDKN2A (p16INK4a), CHEK2, CTNNA1, DICER1, EPCAM (Deletion/duplication testing only), GREM1 (promoter region deletion/duplication testing only), KIT, MEN1, MLH1, MSH2, MSH3, MSH6, MUTYH, NBN, NF1, NHTL1, PALB2, PDGFRA, PMS2, POLD1, POLE, PTEN, RAD50, RAD51C, RAD51D, SDHB, SDHC, SDHD, SMAD4, SMARCA4. STK11, TP53, TSC1, TSC2, and VHL.  The following genes were evaluated for sequence changes only: SDHA and HOXB13 c.251G>A variant only.  (3) breast cancer high risk: intensified screening:  (a) yearly mammography in May  (b) yearly breast MRI in November  (c) biannual MD breast exam  (4) breast cancer high risk: risk reduction  (a) anastrozole started 01/05/2018   PLAN: Angala tolerated her MRI biopsy unusually well.  I have reassured her that as we go on with intensified screening there will be fewer and fewer false positives.  We did discuss exercise but I gave her information on the trails to recovery program  She will be seeing Dr. Dalbert Batman in March.  She will have mammography in May and see me again in June  She knows to call for any other issues that may develop before the next visit.  Magrinat, Virgie Dad, MD  04/07/18 1:29 PM Medical Oncology and Hematology [facility] [address] [place], [place] [address] Tel. [phone]    Fax. [phone]  I, Jacqualyn Posey am acting as a Education administrator for Chauncey Cruel, MD.   Joylene Grapes  Magrinat MD, have reviewed the above documentation for accuracy and completeness, and I agree with the above.

## 2018-04-07 ENCOUNTER — Inpatient Hospital Stay: Payer: BLUE CROSS/BLUE SHIELD | Attending: Genetic Counselor | Admitting: Oncology

## 2018-04-07 ENCOUNTER — Telehealth: Payer: Self-pay | Admitting: Oncology

## 2018-04-07 VITALS — BP 103/59 | HR 98 | Temp 98.2°F | Resp 18 | Ht 65.0 in | Wt 187.1 lb

## 2018-04-07 DIAGNOSIS — Z794 Long term (current) use of insulin: Secondary | ICD-10-CM | POA: Diagnosis not present

## 2018-04-07 DIAGNOSIS — E119 Type 2 diabetes mellitus without complications: Secondary | ICD-10-CM | POA: Insufficient documentation

## 2018-04-07 DIAGNOSIS — Z800 Family history of malignant neoplasm of digestive organs: Secondary | ICD-10-CM | POA: Diagnosis not present

## 2018-04-07 DIAGNOSIS — Z803 Family history of malignant neoplasm of breast: Secondary | ICD-10-CM | POA: Diagnosis not present

## 2018-04-07 DIAGNOSIS — Z87891 Personal history of nicotine dependence: Secondary | ICD-10-CM | POA: Diagnosis not present

## 2018-04-07 DIAGNOSIS — N6091 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: Secondary | ICD-10-CM | POA: Diagnosis not present

## 2018-04-07 DIAGNOSIS — Z79899 Other long term (current) drug therapy: Secondary | ICD-10-CM | POA: Diagnosis not present

## 2018-04-07 NOTE — Telephone Encounter (Signed)
Patient decline avs and calendar °

## 2018-04-08 ENCOUNTER — Other Ambulatory Visit: Payer: Self-pay | Admitting: Primary Care

## 2018-04-08 DIAGNOSIS — E039 Hypothyroidism, unspecified: Secondary | ICD-10-CM

## 2018-04-27 ENCOUNTER — Ambulatory Visit (INDEPENDENT_AMBULATORY_CARE_PROVIDER_SITE_OTHER): Payer: BLUE CROSS/BLUE SHIELD | Admitting: Primary Care

## 2018-04-27 ENCOUNTER — Other Ambulatory Visit: Payer: Self-pay | Admitting: Primary Care

## 2018-04-27 VITALS — BP 124/80 | HR 71 | Temp 98.0°F | Ht 65.0 in | Wt 182.5 lb

## 2018-04-27 DIAGNOSIS — J4520 Mild intermittent asthma, uncomplicated: Secondary | ICD-10-CM | POA: Diagnosis not present

## 2018-04-27 DIAGNOSIS — E785 Hyperlipidemia, unspecified: Secondary | ICD-10-CM

## 2018-04-27 DIAGNOSIS — E039 Hypothyroidism, unspecified: Secondary | ICD-10-CM

## 2018-04-27 DIAGNOSIS — E119 Type 2 diabetes mellitus without complications: Secondary | ICD-10-CM

## 2018-04-27 DIAGNOSIS — Z794 Long term (current) use of insulin: Secondary | ICD-10-CM

## 2018-04-27 LAB — LIPID PANEL
Cholesterol: 158 mg/dL (ref 0–200)
HDL: 54.5 mg/dL (ref >39.00)
LDL Cholesterol: 88 mg/dL (ref 0–99)
NonHDL: 103.25
Total CHOL/HDL Ratio: 3
Triglycerides: 75 mg/dL (ref 0.0–149.0)
VLDL: 15 mg/dL (ref 0.0–40.0)

## 2018-04-27 LAB — MICROALBUMIN / CREATININE URINE RATIO
Creatinine,U: 98 mg/dL
Microalb Creat Ratio: 1 mg/g (ref 0.0–30.0)
Microalb, Ur: 1 mg/dL (ref 0.0–1.9)

## 2018-04-27 LAB — POCT GLYCOSYLATED HEMOGLOBIN (HGB A1C): Hemoglobin A1C: 11.1 % — AB (ref 4.0–5.6)

## 2018-04-27 LAB — TSH: TSH: 0.01 u[IU]/mL — ABNORMAL LOW (ref 0.35–4.50)

## 2018-04-27 MED ORDER — ONETOUCH VERIO VI STRP: ORAL_STRIP | 3 refills | Status: DC

## 2018-04-27 MED ORDER — MONTELUKAST SODIUM 10 MG PO TABS: 10.0000 mg | ORAL_TABLET | Freq: Every day | ORAL | 3 refills | Status: DC

## 2018-04-27 MED ORDER — LEVOTHYROXINE SODIUM 125 MCG PO TABS: ORAL_TABLET | ORAL | 1 refills | Status: DC

## 2018-04-27 MED ORDER — BD PEN NEEDLE MINI U/F 31G X 5 MM MISC: 5 refills | Status: DC

## 2018-04-27 MED ORDER — VICTOZA 18 MG/3ML ~~LOC~~ SOPN: 1.8000 mg | PEN_INJECTOR | Freq: Every day | SUBCUTANEOUS | 5 refills | Status: DC

## 2018-04-27 MED ORDER — BASAGLAR KWIKPEN 100 UNIT/ML ~~LOC~~ SOPN: 40.0000 [IU] | PEN_INJECTOR | SUBCUTANEOUS | 5 refills | Status: DC

## 2018-04-27 NOTE — Assessment & Plan Note (Signed)
Did not return for follow up as instructed, has not been seen since September 2019. A1C today of 11.1, uncontrolled.  Discussed to switch basaglar to AM, increase to 40 units. Continue Victoza and Metformin as prescribed. She cannot tolerate higher doses of Metformin. Urine microalbumin pending today. Continue statin, repeat lipids pending. Referral to diabetes nutritionist provided today.  Discussed that it is imperative that she follow up in 6 weeks with her glucose logs, she verbalized understanding.

## 2018-04-27 NOTE — Assessment & Plan Note (Signed)
Taking levothyroxine appropriately. Repeat TSH pending. Will await for labs prior to sending refills to ensure dose is correct.

## 2018-04-27 NOTE — Assessment & Plan Note (Signed)
Repeat lipid panel pending. Continue atorvastatin.

## 2018-04-27 NOTE — Assessment & Plan Note (Signed)
No use of Advair or albuterol in months. Refill provided for Singulair.  Continue to monitor, no wheezing on exam.

## 2018-04-27 NOTE — Patient Instructions (Addendum)
Stop by the lab prior to leaving today. I will notify you of your results once received.   We've increased your basaglar to 40 units, start taking this every morning. Move your Victoza to bedtime.  Start checking your blood sugars 2-3 times daily, rotating times as discussed.  You must take your levothyroxine every morning with water only. No food or other medications for 30 minutes. No calcium or multivitamins for four hours. I will send refills once I receive your lab results.  You will be contacted regarding your referral to the diabetes nutritionist.  Please let us know if you have not been contacted within one week.   It is important that you improve your diet. Please limit carbohydrates in the form of white bread, rice, pasta, sweets, fast food, fried food, sugary drinks, etc. Increase your consumption of fresh fruits and vegetables, whole grains, lean protein.  Ensure you are consuming 64 ounces of water daily.  Start exercising. You should be getting 150 minutes of moderate intensity exercise weekly.  Schedule a follow up visit for 6 weeks for diabetes check. Bring your glucose logs.  It was a pleasure to see you today!   Diabetes Mellitus and Nutrition, Adult When you have diabetes (diabetes mellitus), it is very important to have healthy eating habits because your blood sugar (glucose) levels are greatly affected by what you eat and drink. Eating healthy foods in the appropriate amounts, at about the same times every day, can help you:  Control your blood glucose.  Lower your risk of heart disease.  Improve your blood pressure.  Reach or maintain a healthy weight. Every person with diabetes is different, and each person has different needs for a meal plan. Your health care provider may recommend that you work with a diet and nutrition specialist (dietitian) to make a meal plan that is best for you. Your meal plan may vary depending on factors such as:  The calories you  need.  The medicines you take.  Your weight.  Your blood glucose, blood pressure, and cholesterol levels.  Your activity level.  Other health conditions you have, such as heart or kidney disease. How do carbohydrates affect me? Carbohydrates, also called carbs, affect your blood glucose level more than any other type of food. Eating carbs naturally raises the amount of glucose in your blood. Carb counting is a method for keeping track of how many carbs you eat. Counting carbs is important to keep your blood glucose at a healthy level, especially if you use insulin or take certain oral diabetes medicines. It is important to know how many carbs you can safely have in each meal. This is different for every person. Your dietitian can help you calculate how many carbs you should have at each meal and for each snack. Foods that contain carbs include:  Bread, cereal, rice, pasta, and crackers.  Potatoes and corn.  Peas, beans, and lentils.  Milk and yogurt.  Fruit and juice.  Desserts, such as cakes, cookies, ice cream, and candy. How does alcohol affect me? Alcohol can cause a sudden decrease in blood glucose (hypoglycemia), especially if you use insulin or take certain oral diabetes medicines. Hypoglycemia can be a life-threatening condition. Symptoms of hypoglycemia (sleepiness, dizziness, and confusion) are similar to symptoms of having too much alcohol. If your health care provider says that alcohol is safe for you, follow these guidelines:  Limit alcohol intake to no more than 1 drink per day for nonpregnant women and 2 drinks  per day for men. One drink equals 12 oz of beer, 5 oz of wine, or 1 oz of hard liquor.  Do not drink on an empty stomach.  Keep yourself hydrated with water, diet soda, or unsweetened iced tea.  Keep in mind that regular soda, juice, and other mixers may contain a lot of sugar and must be counted as carbs. What are tips for following this plan?  Reading  food labels  Start by checking the serving size on the "Nutrition Facts" label of packaged foods and drinks. The amount of calories, carbs, fats, and other nutrients listed on the label is based on one serving of the item. Many items contain more than one serving per package.  Check the total grams (g) of carbs in one serving. You can calculate the number of servings of carbs in one serving by dividing the total carbs by 15. For example, if a food has 30 g of total carbs, it would be equal to 2 servings of carbs.  Check the number of grams (g) of saturated and trans fats in one serving. Choose foods that have low or no amount of these fats.  Check the number of milligrams (mg) of salt (sodium) in one serving. Most people should limit total sodium intake to less than 2,300 mg per day.  Always check the nutrition information of foods labeled as "low-fat" or "nonfat". These foods may be higher in added sugar or refined carbs and should be avoided.  Talk to your dietitian to identify your daily goals for nutrients listed on the label. Shopping  Avoid buying canned, premade, or processed foods. These foods tend to be high in fat, sodium, and added sugar.  Shop around the outside edge of the grocery store. This includes fresh fruits and vegetables, bulk grains, fresh meats, and fresh dairy. Cooking  Use low-heat cooking methods, such as baking, instead of high-heat cooking methods like deep frying.  Cook using healthy oils, such as olive, canola, or sunflower oil.  Avoid cooking with butter, cream, or high-fat meats. Meal planning  Eat meals and snacks regularly, preferably at the same times every day. Avoid going long periods of time without eating.  Eat foods high in fiber, such as fresh fruits, vegetables, beans, and whole grains. Talk to your dietitian about how many servings of carbs you can eat at each meal.  Eat 4-6 ounces (oz) of lean protein each day, such as lean meat, chicken,  fish, eggs, or tofu. One oz of lean protein is equal to: ? 1 oz of meat, chicken, or fish. ? 1 egg. ?  cup of tofu.  Eat some foods each day that contain healthy fats, such as avocado, nuts, seeds, and fish. Lifestyle  Check your blood glucose regularly.  Exercise regularly as told by your health care provider. This may include: ? 150 minutes of moderate-intensity or vigorous-intensity exercise each week. This could be brisk walking, biking, or water aerobics. ? Stretching and doing strength exercises, such as yoga or weightlifting, at least 2 times a week.  Take medicines as told by your health care provider.  Do not use any products that contain nicotine or tobacco, such as cigarettes and e-cigarettes. If you need help quitting, ask your health care provider.  Work with a Social worker or diabetes educator to identify strategies to manage stress and any emotional and social challenges. Questions to ask a health care provider  Do I need to meet with a diabetes educator?  Do I need  to meet with a dietitian?  What number can I call if I have questions?  When are the best times to check my blood glucose? Where to find more information:  American Diabetes Association: diabetes.org  Academy of Nutrition and Dietetics: www.eatright.[employer] of Diabetes and Digestive and Kidney Diseases (NIH): [employer] Summary  A healthy meal plan will help you control your blood glucose and maintain a healthy lifestyle.  Working with a diet and nutrition specialist (dietitian) can help you make a meal plan that is best for you.  Keep in mind that carbohydrates (carbs) and alcohol have immediate effects on your blood glucose levels. It is important to count carbs and to use alcohol carefully. This information is not intended to replace advice given to you by your health care provider. Make sure you discuss any questions you have with your health care provider. Document  Released: 11/28/2004 Document Revised: 10/01/2016 Document Reviewed: 04/07/2016 Elsevier Interactive Patient Education  2019 [employer].

## 2018-04-27 NOTE — Progress Notes (Signed)
Subjective:    Patient ID: Molly Wall, female    DOB: 1945-10-28, 73 y.o.   MRN: [id number]  HPI  Molly Wall is a 73 year old female who presents today for follow up. She was advised in September 2019 to return four weeks later for follow up, she has returned today.  1) Type 2 Diabetes:  Current medications include: Basaglar 34 units HS, Metformin Er 1000 mg daily, Victoza 1.8 mg daily.  She is checking her blood glucose one times daily and is getting readings of: AM fasting: 110's-low 200's  Highest reading: 500 right after dinner Lowest reading: 80's  Last A1C: 11.3 in September 2019, 11.1 today Last Eye Exam: Completed in November 2019 Last Foot Exam: Due Pneumonia Vaccination: Completed in 2016 ACE/ARB: Urine microalbumin due Statin: atorvastatin   Diet currently consists of:  Breakfast: Bacon, eggs, fruit with peanut butter, nuts Lunch: Soup, chili Dinner: Soup, occasional salad with protein, restaurants (meat, potatoes, veggies) Snacks: Nuts, nut clusters, crackers with cream cheese and meat Desserts: 2-3 times weekly  Beverages: Juice, water mostly, almond milk  Exercise: She is not exercising   She has noticed polyuria, also some polyphagia.   2) Hypothyroidism: Currently managed on levothyroxine 150 mcg. During her last visit she endorsed taking her levothyroxine 150 mcg with water and coffee, also taking with other medications. TSH that day of 13.9 so she was encouraged to take the medication appropriately (instructions provided) and return four weeks later for repeat thyroid testing.   She returns today and endorses taking her levothyroxine first thing in the morning with water only, waits 30 minutes before drinking coffee, waits 1 hour before eating and taking other medications. She denies palpitations, fatigue.  3) Chronic Asthma: Currently managed on albuterol inhaler PRN, Adviar 250-50 mcg, montelukast 10 mg daily. She is not using Adviar. Infrequent  use of albuterol. She is taking Singulair daily.  BP Readings from Last 3 Encounters:  04/27/18 124/80  04/07/18 (!) 103/59  01/18/18 120/76   Wt Readings from Last 3 Encounters:  04/27/18 182 lb 8 oz (82.8 kg)  04/07/18 187 lb 1.6 oz (84.9 kg)  01/18/18 190 lb 4 oz (86.3 kg)     Review of Systems  Constitutional: Negative for fatigue.  Respiratory: Negative for shortness of breath.   Cardiovascular: Negative for chest pain.  Endocrine: Positive for polyphagia and polyuria.  Neurological: Negative for dizziness and numbness.       Past Medical History:  Diagnosis Date  . Allergic rhinitis   . Allergy   . Asthma   . Atypical ductal hyperplasia of right breast   . 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 Breast cancer ([place])   . Diabetes mellitus without complication ([place])   . Family history of breast cancer   . Family history of colon cancer   . Hypercholesteremia   . Hypothyroidism   . Lumbar herniated disc   . OSA (obstructive sleep apnea)    used to use CPAP, not any more over 1 1/2 yrs  . Sleep apnea    no cpap in 2 yrs   . Vitamin D deficiency      Social History   Socioeconomic History  . Marital status: Divorced    Spouse name: Not on file  . Number of children: 2  . Years of education: Not on file  . Highest education level: Not on file  Occupational History  . Not on file  Social Needs  .  Financial resource strain: Not on file  . Food insecurity:    Worry: Not on file    Inability: Not on file  . Transportation needs:    Medical: Not on file    Non-medical: Not on file  Tobacco Use  . Smoking status: Former Smoker    Types: Cigarettes  . Smokeless tobacco: Never Used  Substance and Sexual Activity  . Alcohol use: Yes    Comment: 2 per month  . Drug use: No  . Sexual activity: Not Currently  Lifestyle  . Physical activity:    Days per week: Not on file    Minutes per session: Not on file  . Stress: Not on file    Relationships  . Social connections:    Talks on phone: Not on file    Gets together: Not on file    Attends religious service: Not on file    Active member of club or organization: Not on file    Attends meetings of clubs or organizations: Not on file    Relationship status: Not on file  . Intimate partner violence:    Fear of current or ex partner: Not on file    Emotionally abused: Not on file    Physically abused: Not on file    Forced sexual activity: Not on file  Other Topics Concern  . Not on file  Social History Narrative   Single.   Moved from [place] to [place] several weeks ago.   Family lives in [place].   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    Past Surgical History:  Procedure Laterality Date  . APPENDECTOMY    . BREAST LUMPECTOMY WITH RADIOACTIVE SEED LOCALIZATION Right 08/12/2017   Procedure: RIGHT BREAST LUMPECTOMY X'S 2 WITH RADIOACTIVE SEED LOCALIZATION X'S 2;  Surgeon: Fanny Skates, MD;  Location: [facility];  Service: General;  Laterality: Right;  . COLONOSCOPY     [place] but given a 10 yr recall per pt   . GANGLION CYST EXCISION     73 years old  . INCISION AND DRAINAGE ABSCESS ANAL    . KNEE SURGERY  2014  . SHOULDER SURGERY  1976    Family History  Problem Relation Age of Onset  . Alzheimer's disease Mother   . Asthma Mother   . Dementia Mother        cause of death  . Arthritis Father   . Heart attack Father 63       cause of death  . Breast cancer Sister 67  . Colon cancer Paternal Grandmother   . Liver cancer Maternal Uncle   . Colon polyps Neg Hx   . Rectal cancer Neg Hx   . Stomach cancer Neg Hx     Allergies  Allergen Reactions  . Iodine Anaphylaxis    Radioactive iodine dye    Current Outpatient Medications on File Prior to Visit  Medication Sig Dispense Refill  . albuterol (PROAIR HFA) 108 (90  Base) MCG/ACT inhaler Inhale 2 puffs into the lungs every 6 (six) hours as needed for wheezing or shortness of breath. NEED APPOINTMENT FOR ANY MORE REFILLS 18 g 0  . anastrozole (ARIMIDEX) 1 MG tablet Take 1 tablet (1 mg total) by mouth daily. 90 tablet 4  . atorvastatin (LIPITOR) 20 MG tablet Take 1 tablet by mouth daily for cholesterol. 90 tablet 3  .  fluticasone (FLONASE) 50 MCG/ACT nasal spray Place 2 sprays into both nostrils daily. 48 g 1  . levothyroxine (SYNTHROID, LEVOTHROID) 150 MCG tablet TAKE 1 TAB EVERY MORNING W/ A FULL GLASS OF WATER ON EMPTY STOMACH. NO FOOD OR MEDS FOR 30 MINS 30 tablet 0  . metformin (FORTAMET) 1000 MG (OSM) 24 hr tablet Take 1 tablet (1,000 mg total) by mouth daily with breakfast. 90 tablet 3   No current facility-administered medications on file prior to visit.     BP 124/80   Pulse 71   Temp 98 F (36.7 C) (Oral)   Ht 5\' 5"  (1.651 m)   Wt 182 lb 8 oz (82.8 kg)   SpO2 98%   BMI 30.37 kg/m    Objective:   Physical Exam  Constitutional: She appears well-nourished.  Neck: Neck supple.  Cardiovascular: Normal rate and regular rhythm.  Respiratory: Effort normal and breath sounds normal. She has no wheezes.  Skin: Skin is warm and dry.  Psychiatric: She has a normal mood and affect.           Assessment & Plan:

## 2018-05-09 ENCOUNTER — Other Ambulatory Visit: Payer: Self-pay | Admitting: Primary Care

## 2018-05-09 DIAGNOSIS — E039 Hypothyroidism, unspecified: Secondary | ICD-10-CM

## 2018-05-17 ENCOUNTER — Encounter: Payer: BLUE CROSS/BLUE SHIELD | Attending: Primary Care | Admitting: Dietician

## 2018-05-17 ENCOUNTER — Encounter: Payer: Self-pay | Admitting: Dietician

## 2018-05-17 VITALS — BP 134/76 | Ht 65.0 in | Wt 183.4 lb

## 2018-05-17 DIAGNOSIS — Z794 Long term (current) use of insulin: Secondary | ICD-10-CM | POA: Insufficient documentation

## 2018-05-17 DIAGNOSIS — E119 Type 2 diabetes mellitus without complications: Secondary | ICD-10-CM | POA: Insufficient documentation

## 2018-05-17 NOTE — Progress Notes (Signed)
Diabetes Self-Management Education  Visit Type: First/Initial  Appt. Start Time: 1315 Appt. End Time: 1430  05/17/2018  [name], identified by name and date of birth, is a 73 y.o. female with a diagnosis of Diabetes: Type 2.   ASSESSMENT  Blood pressure 134/76, height 5\' 5"  (1.651 m), weight 183 lb 6.4 oz (83.2 kg). Body mass index is 30.52 kg/m.  Diabetes Self-Management Education - 05/17/18 1502      Visit Information   Visit Type  First/Initial      Initial Visit   Diabetes Type  Type 2      Health Coping   How would you rate your overall health?  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  Needs Review    Patient understands incorporating nutritional management into lifestyle.  Needs Review    Patient undertands incorporating physical activity into lifestyle.  Needs Review    Patient understands using medications safely.  Needs Review    Patient understands monitoring blood glucose, interpreting and using results  Needs Review    Patient understands prevention, detection, and treatment of acute complications.  Needs Review    Patient understands prevention, detection, and treatment of chronic complications.  Needs Review    Patient understands how to develop strategies to address psychosocial issues.  Needs Review    Patient understands how to develop strategies to promote health/change behavior.  Needs Review      Complications    Last HgB A1C per patient/outside source  11.1 %   04-27-18   How often do you check your blood sugar?  1-2 times/day    Fasting Blood glucose range (mg/dL)  130-179;>200;180-200    Postprandial Blood glucose range (mg/dL)  >200    Have you had a dilated eye exam in the past 12 months?  Yes   4-5 months ago   Have you had a dental exam in the past 12 months?  Yes   2 wks ago   Are you checking your feet?  No      Dietary Intake   Breakfast  eats at 10a=nuts and dried fruit, apple with peanut butter, eggs/bacon, homemade museli bars    Snack (morning)  none    Lunch  eats at 1-2p=salad with chicken or leftovers from supper    Snack (afternoon)  eats nuts with dried fruit or outshine bar (frozen fruit bar)    Dinner  eats at 5p-7p=meat and vegetables    Snack (evening)  eats nuts with dried fruit or outshine frozen fruit bar    Beverage(s)  drinks water 8+x/day, occasional almond milk, occasional sugar free flavored drink      Exercise   Exercise Type  ADL's      Patient Education   Previous Diabetes Education  Yes (please comment)   went to class at [facility] in [place], [place]   Disease state   Explored  patient's options for treatment of their diabetes;Definition of diabetes, type 1 and 2, and the diagnosis of diabetes    Nutrition management   Role of diet in the treatment of diabetes and the relationship between the three main macronutrients and blood glucose level;Food label reading, portion sizes and measuring food.;Carbohydrate counting    Physical activity and exercise   Role of exercise on diabetes management, blood pressure control and cardiac health.;Helped patient identify appropriate exercises in relation to his/her diabetes, diabetes complications and other health issue.    Medications  Taught/reviewed insulin injection, site rotation, insulin storage and needle disposal.;Reviewed patients medication for diabetes, action, purpose, timing of dose and side effects.     Monitoring  Taught/evaluated SMBG meter.;Purpose and frequency of SMBG.;Taught/discussed recording of test results and interpretation of SMBG.;Identified appropriate SMBG and/or A1C goals.;Yearly dilated eye exam    Acute complications  Taught treatment of hypoglycemia - the 15 rule.    Chronic complications  Relationship between chronic complications and blood glucose control;Lipid levels, blood glucose control and heart disease;Dental care;Retinopathy and reason for yearly dilated eye exams;Reviewed with patient heart disease, higher risk of, and prevention    Personal strategies to promote health  Lifestyle issues that need to be addressed for better diabetes care;Helped patient develop diabetes management plan for (enter comment)      Outcomes   Expected Outcomes  Demonstrated interest in learning. Expect positive outcomes       Individualized Plan for Diabetes Self-Management Training:   Learning Objective:  Patient will have a greater understanding of diabetes self-management. Patient education plan is to attend individual and/or group sessions per assessed needs and concerns.   Plan:   Patient Instructions   Check blood sugars 2 x day before breakfast and 2 hrs after supper every day and record  Bring blood sugar records to the next appointment/class  Exercise:  Start riding recumbent bike 20 min. 3 days/wk-ONLY if sugar is below 250  Eat 3 meals day and  1  snack a day at bedtime  Eat 2-3 carbohydrate servings/meal + protein  Eat 1 carbohydrate serving/snack + protein  Space meals 4-5 hours apart  Avoid sugar sweetened drinks (soda, tea, coffee, sports drinks, juices)  Limit intake of sweets, fried foods and snack foods  Continue to drink plenty of water  Carry fast acting glucose and a snack at all times  Rotate injection sites  Return for appointment/classes on:  05-27-18   Expected Outcomes:  Demonstrated interest in learning. Expect positive  outcomes  Education material provided: General meal planning guidelines, Food Group handout, low BG handout  If problems or questions, patient to contact team via: [phone]  Future DSME appointment:  05-27-18

## 2018-05-17 NOTE — Patient Instructions (Addendum)
  Check blood sugars 2 x day before breakfast and 2 hrs after supper every day and record  Bring blood sugar records to the next appointment/class  Exercise:  Start riding recumbent bike 20 min. 3 days/wk-ONLY if sugar is below 250  Eat 3 meals day and  1  snacks a day at bedtime  Eat 2-3 carbohydrate servings/meal + protein  Eat 1 carbohydrate serving/snack + protein  Space meals 4-5 hours apart  Avoid sugar sweetened drinks (soda, tea, coffee, sports drinks, juices)  Limit intake of sweets, fried foods and snack foods  Continue to drink plenty of water  Carry fast acting glucose and a snack at all times  Rotate injection sites  Return for appointment/classes on:  05-27-18

## 2018-05-27 ENCOUNTER — Encounter: Payer: BLUE CROSS/BLUE SHIELD | Admitting: Dietician

## 2018-05-27 ENCOUNTER — Other Ambulatory Visit: Payer: Self-pay

## 2018-05-27 ENCOUNTER — Encounter: Payer: Self-pay | Admitting: Dietician

## 2018-05-27 VITALS — Ht 65.0 in | Wt 184.3 lb

## 2018-05-27 DIAGNOSIS — E119 Type 2 diabetes mellitus without complications: Secondary | ICD-10-CM | POA: Diagnosis not present

## 2018-05-27 DIAGNOSIS — Z794 Long term (current) use of insulin: Secondary | ICD-10-CM

## 2018-05-27 NOTE — Progress Notes (Signed)
Appt. Start Time:9:00am Appt. End Time: 12:00  Class 1 Diabetes Overview - define DM; state own type of DM; identify functions of pancreas and insulin; define insulin deficiency vs insulin resistance  Psychosocial - identify DM as a source of stress; state the effects of stress on BG control  Nutritional Management - describe effects of food on blood glucose; identify sources of carbohydrate, protein and fat; verbalize the importance of balance meals in controlling blood glucose  Exercise - describe the effects of exercise on blood glucose and importance of regular exercise in controlling diabetes; state a plan for personal exercise; verbalize contraindications for exercise  Self-Monitoring - state importance of SMBG; use SMBG results to effectively manage diabetes; identify importance of regular HbA1C testing and goals for results  Acute Complications - recognize hyperglycemia and hypoglycemia with causes and effects; identify blood glucose results as high, low or in control; list steps in treating and preventing high and low blood glucose  Chronic Complications/Foot, Skin, Eye Dental Care - identify possible long-term complications of diabetes (retinopathy, neuropathy, nephropathy, cardiovascular disease, infections); state importance of daily self-foot exams; describe how to examine feet and what to look for; explain appropriate eye and dental care  Lifestyle Changes/Goals & Health/Community Resources - state benefits of making appropriate lifestyle changes; identify habits that need to change (meals, tobacco, alcohol); identify strategies to reduce risk factors for personal health  Pregnancy/Sexual Health - define gestational diabetes; state importance of good blood glucose control and birth control prior to pregnancy; state importance of good blood glucose control in preventing sexual problems (impotence, vaginal dryness, infections, loss of desire); state relationship of blood glucose  control and pregnancy outcome; describe risk of maternal and fetal complications  Teaching Materials Used: Class 1 Slides/Notebook Diabetes Booklet ID Card  Medic Alert/Medic ID Forms Sleep Evaluation Exercise Handout Planning a Balanced Meal Goals for Class 1  

## 2018-06-07 ENCOUNTER — Telehealth: Payer: Self-pay | Admitting: Primary Care

## 2018-06-07 NOTE — Telephone Encounter (Signed)
Ellene Route to wait until May, please schedule. Will you transfer the call over to Gae Bon? Gae Bon, How are her blood sugars running and what time of day is she checking?

## 2018-06-07 NOTE — Telephone Encounter (Signed)
Called patient and she stated she didn't have time to talk to discuss blood sugars. She had a call on the other line. I went ahead and cancelled physical and she is requesting a call back at another time to discuss blood sugars.

## 2018-06-07 NOTE — Telephone Encounter (Signed)
Called to r/s patient's physical to May. Pt wants to make sure it is okay to push this out because she is concerned that she is supposed to have labs drawn. She doesn't want to wait too long if it is something that needs to be checked now. Please advise.

## 2018-06-08 NOTE — Telephone Encounter (Signed)
Great. Sounds good.

## 2018-06-08 NOTE — Telephone Encounter (Signed)
Spoken to patient and she will reschedule appointment in June. She will call back later due current situation. Patient stated that her blood sugar have been running between 86-151. Patient checks her blood sugar in the morning fasting and 2 hours after dinner.

## 2018-06-09 ENCOUNTER — Ambulatory Visit: Payer: BLUE CROSS/BLUE SHIELD | Admitting: Primary Care

## 2018-06-10 ENCOUNTER — Encounter: Payer: Self-pay | Admitting: *Deleted

## 2018-06-10 ENCOUNTER — Other Ambulatory Visit: Payer: Self-pay

## 2018-06-10 VITALS — Wt 184.4 lb

## 2018-06-10 DIAGNOSIS — Z794 Long term (current) use of insulin: Secondary | ICD-10-CM

## 2018-06-10 DIAGNOSIS — E119 Type 2 diabetes mellitus without complications: Secondary | ICD-10-CM

## 2018-06-10 NOTE — Progress Notes (Signed)
Faxed pt's MD office Alma Friendly, NP) regarding patient's post meal blood sugars (198-431 mg/gdL). Her recent MD appt was rescheduled to June due to COVID-19. Pt also reports difficulty remembering to take her medications. Mailed patient information on Medication Reminder Strategies. She is scheduled to return next month to complete Diabetes Education classes.

## 2018-06-10 NOTE — Progress Notes (Signed)
Appt. Start Time: 1315 Appt. End Time: 1615  Class 2 Nutritional Management - identify sources of carbohydrate, protein and fat; plan balanced meals; estimate servings of carbohydrates in meals  Psychosocial - identify DM as a source of stress; state the effects of stress on BG control  Exercise - describe the effects of exercise on blood glucose and importance of regular exercise in controlling diabetes; state a plan for personal exercise; verbalize contraindications for exercise  Self-Monitoring - state importance of SMBG; use SMBG results to effectively manage diabetes; identify importance of regular HbA1C testing and goals for results  Acute Complications - recognize hyperglycemia and hypoglycemia with causes and effects; identify blood glucose results as high, low or in control; list steps in treating and preventing high and low blood glucose  Sick Day Guidelines: state appropriate measure to manage blood glucose when ill (need for meds, HBGM plan, when to call physician, need for fluids)  Chronic Complications/Foot, Skin, Eye Dental Care - identify possible long-term complications of diabetes (retinopathy, neuropathy, nephropathy, cardiovascular disease, infections); explain steps in prevention and treatment of chronic complications; state importance of daily self-foot exams; describe how to examine feet and what to look for; explain appropriate eye and dental care  Lifestyle Changes/Goals - state benefits of making appropriate lifestyle changes; identify habits that need to change (meals, tobacco, alcohol); identify strategies to reduce risk factors for personal health  Pregnancy/Sexual Health - state importance of good blood glucose control in preventing sexual problems (impotence, vaginal dryness, infections, loss of desire)  Teaching Materials Used: Class 2 Slide Packet A1C Pamphlet Foot Care Literature Kidney Test Handout Stroke Card Quick and "Balanced" Meal Ideas Carb  Counting and Meal Planning Book Goals for Class 2

## 2018-06-16 ENCOUNTER — Other Ambulatory Visit: Payer: Self-pay | Admitting: Oncology

## 2018-06-16 DIAGNOSIS — Z803 Family history of malignant neoplasm of breast: Secondary | ICD-10-CM

## 2018-06-16 DIAGNOSIS — N6091 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: Secondary | ICD-10-CM

## 2018-06-18 ENCOUNTER — Encounter: Payer: Self-pay | Admitting: Primary Care

## 2018-06-18 ENCOUNTER — Ambulatory Visit (INDEPENDENT_AMBULATORY_CARE_PROVIDER_SITE_OTHER): Payer: BLUE CROSS/BLUE SHIELD | Admitting: Primary Care

## 2018-06-18 ENCOUNTER — Other Ambulatory Visit: Payer: Self-pay

## 2018-06-18 ENCOUNTER — Other Ambulatory Visit (INDEPENDENT_AMBULATORY_CARE_PROVIDER_SITE_OTHER): Payer: BLUE CROSS/BLUE SHIELD

## 2018-06-18 DIAGNOSIS — E119 Type 2 diabetes mellitus without complications: Secondary | ICD-10-CM

## 2018-06-18 DIAGNOSIS — E039 Hypothyroidism, unspecified: Secondary | ICD-10-CM | POA: Diagnosis not present

## 2018-06-18 DIAGNOSIS — Z794 Long term (current) use of insulin: Secondary | ICD-10-CM | POA: Diagnosis not present

## 2018-06-18 LAB — TSH: TSH: 0.05 u[IU]/mL — ABNORMAL LOW (ref 0.35–4.50)

## 2018-06-18 NOTE — Patient Instructions (Signed)
Continue Metformin ER 1000 mg daily. Continue Victoza 1.8 mg daily. Increase Basaglar to 45 units in the morning. Continue to check your blood sugars as discussed, notify me if you continue to see readings at or above 200 mg.   Repeat A1C and follow up in 6 weeks for diabetes check.  It was nice to see you! Allie Bossier, NP-C

## 2018-06-18 NOTE — Assessment & Plan Note (Signed)
Improved glucose readings based off of patient's home readings, not at goal. Morning readings are decent, evening readings are too high. Continue Metformin ER 1000 mg daily and Victoza 1.8 mg daily. Increase Basaglar to 45 units every morning, consider increasing to 50 units. Would be reticent to divide doses/adding PM dose as her morning readings are lower.  She will continue to monitor glucose readings and report if readings are at or above 200 consistently.  We will plan to repeat A1C and follow up in 6 weeks.

## 2018-06-18 NOTE — Progress Notes (Signed)
Subjective:    Patient ID: Molly Wall, female    DOB: 1945/12/27, 73 y.o.   MRN: [id number]  HPI  Virtual Visit via Video Note  I connected with Molly Wall on 06/18/18 at  9:40 AM EDT by a video enabled telemedicine application and verified that I am speaking with the correct person using two identifiers.   I discussed the limitations of evaluation and management by telemedicine and the availability of in person appointments. The patient expressed understanding and agreed to proceed. She is at home, I am in the office.  History of Present Illness:  Molly Wall is a 73 year old female who presents today via video for follow up of diabetes and hypothyroidism.  Current medications include: Metformin ER 1000 mg daily, Basaglar 40 units, Victoza 1.8 mg nightly.  She is checking her blood glucose 2 times daily and is getting readings of: AM fasting: low 100's-mid 100's 2 hours after dinner: mid 200's-mid 300's  Lowest reading of 80 yesterday Highest reading of 431  Last A1C: 11.1 in mid February 2020 Last Eye Exam: Due in November 2020 Last Foot Exam: Due next visit  Pneumonia Vaccination: Completed in 2016 ACE/ARB: Urine microalbumin negative Statin: atorvastatin   Diet currently consists of: She is following with a nutritionist now, endorses a healthy diet. Limited sweets.   Wt Readings from Last 3 Encounters:  06/10/18 184 lb 6.4 oz (83.6 kg)  05/27/18 184 lb 4.8 oz (83.6 kg)  05/17/18 183 lb 6.4 oz (83.2 kg)   She is compliant to her levothyroxine 125 mg every morning with water only. She doesn't eat or take other medications for 30 minutes. She doesn't take vitamins for four hours. Does not take PPI.   Observations/Objective:   Appears well. Speaking in complete sentences. No distress.  Assessment and Plan:  Improved glucose readings based off of patient's home readings, not at goal. Morning readings are decent, evening readings are too high. Continue  Metformin ER 1000 mg daily and Victoza 1.8 mg daily. Increase Basaglar to 45 units every morning, consider increasing to 50 units. Would be reticent to divide doses/adding PM dose as her morning readings are lower.  She will continue to monitor glucose readings and report if readings are at or above 200 consistently.  We will plan to repeat A1C and follow up in 6 weeks.   Follow Up Instructions: Continue Metformin ER 1000 mg daily. Continue Victoza 1.8 mg daily. Increase Basaglar to 45 units in the morning. Continue to check your blood sugars as discussed, notify me if you continue to see readings at or above 200 mg.   Repeat A1C and follow up in 6 weeks for diabetes check.   I discussed the assessment and treatment plan with the patient. The patient was provided an opportunity to ask questions and all were answered. The patient agreed with the plan and demonstrated an understanding of the instructions.   The patient was advised to call back or seek an in-person evaluation if the symptoms worsen or if the condition fails to improve as anticipated.    [name], NP    Review of Systems  Eyes: Negative for visual disturbance.  Respiratory: Negative for shortness of breath.   Cardiovascular: Negative for chest pain.  Neurological: Negative for dizziness and headaches.       Past Medical History:  Diagnosis Date  . Allergic rhinitis   . Allergy   . Asthma   . Atypical ductal hyperplasia of  right breast   . 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 Breast cancer ([place])   . Diabetes mellitus without complication ([place])   . Family history of breast cancer   . Family history of colon cancer   . Hypercholesteremia   . Hypothyroidism   . Lumbar herniated disc   . OSA (obstructive sleep apnea)    used to use CPAP, not any more over 1 1/2 yrs  . Sleep apnea    no cpap in 2 yrs   . Vitamin D deficiency      Social History   Socioeconomic History  .  Marital status: Divorced    Spouse name: Not on file  . Number of children: 2  . Years of education: Not on file  . Highest education level: Not on file  Occupational History  . Not on file  Social Needs  . Financial resource strain: Not on file  . Food insecurity:    Worry: Not on file    Inability: Not on file  . Transportation needs:    Medical: Not on file    Non-medical: Not on file  Tobacco Use  . Smoking status: Former Smoker    Types: Cigarettes  . Smokeless tobacco: Never Used  Substance and Sexual Activity  . Alcohol use: Yes    Alcohol/week: 0.0 - 1.0 standard drinks    Comment: 2 per month  . Drug use: No  . Sexual activity: Not Currently  Lifestyle  . Physical activity:    Days per week: Not on file    Minutes per session: Not on file  . Stress: Not on file  Relationships  . Social connections:    Talks on phone: Not on file    Gets together: Not on file    Attends religious service: Not on file    Active member of club or organization: Not on file    Attends meetings of clubs or organizations: Not on file    Relationship status: Not on file  . Intimate partner violence:    Fear of current or ex partner: Not on file    Emotionally abused: Not on file    Physically abused: Not on file    Forced sexual activity: Not on file  Other Topics Concern  . Not on file  Social History Narrative   Single.   Moved from [place] to [place] several weeks ago.   Family lives in [place].   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    Past Surgical History:  Procedure Laterality Date  . APPENDECTOMY    . BREAST LUMPECTOMY WITH RADIOACTIVE SEED LOCALIZATION Right 08/12/2017   Procedure: RIGHT BREAST LUMPECTOMY X'S 2 WITH RADIOACTIVE SEED LOCALIZATION X'S 2;  Surgeon: Fanny Skates, MD;  Location: [facility];  Service: General;  Laterality: Right;  . COLONOSCOPY     11  yrs ago in [place] but given a 10 yr recall per pt   . GANGLION CYST EXCISION     72 years old  . INCISION AND DRAINAGE ABSCESS ANAL    . KNEE SURGERY  2014  . SHOULDER SURGERY  1976    Family History  Problem Relation Age of Onset  . Alzheimer's disease Mother   . Asthma Mother   . Dementia Mother        cause of death  . Arthritis Father   .  Heart attack Father 84       cause of death  . Breast cancer Sister 12  . Colon cancer Paternal Grandmother   . Liver cancer Maternal Uncle   . Colon polyps Neg Hx   . Rectal cancer Neg Hx   . Stomach cancer Neg Hx     Allergies  Allergen Reactions  . Iodine Anaphylaxis    Radioactive iodine dye    Current Outpatient Medications on File Prior to Visit  Medication Sig Dispense Refill  . albuterol (PROAIR HFA) 108 (90 Base) MCG/ACT inhaler Inhale 2 puffs into the lungs every 6 (six) hours as needed for wheezing or shortness of breath. NEED APPOINTMENT FOR ANY MORE REFILLS 18 g 0  . anastrozole (ARIMIDEX) 1 MG tablet Take 1 tablet (1 mg total) by mouth daily. 90 tablet 4  . atorvastatin (LIPITOR) 20 MG tablet Take 1 tablet by mouth daily for cholesterol. 90 tablet 3  . B-D UF III MINI PEN NEEDLES 31G X 5 MM MISC USE AS DIRECTED USE DAILY TO ADMINISTER VICTOZA 100 each 5  . fluticasone (FLONASE) 50 MCG/ACT nasal spray Place 2 sprays into both nostrils daily. 48 g 1  . Fluticasone-Salmeterol (ADVAIR) 250-50 MCG/DOSE AEPB Inhale 1 puff into the lungs. 1 puff daily as needed    . Insulin Glargine (BASAGLAR KWIKPEN) 100 UNIT/ML SOPN Inject 0.4 mLs (40 Units total) into the skin every morning. 15 mL 5  . levothyroxine (SYNTHROID) 125 MCG tablet Take 1 tablet by mouth every morning on an empty stomach with water only.  No food or other medicines for 1 hour. 30 tablet 1  . metformin (FORTAMET) 1000 MG (OSM) 24 hr tablet Take 1 tablet (1,000 mg total) by mouth daily with breakfast. 90 tablet 3  . montelukast (SINGULAIR) 10 MG tablet  Take 1 tablet (10 mg total) by mouth at bedtime. For allergies and asthma. 90 tablet 3  . ONETOUCH VERIO test strip USE AS DIRECTED TO CHECK FASTING BLOOD SUGARS three times DAILY. 100 each 3  . VICTOZA 18 MG/3ML SOPN Inject 0.3 mLs (1.8 mg total) into the skin daily. For diabetes. 3 mL 5   No current facility-administered medications on file prior to visit.     There were no vitals taken for this visit.   Objective:   Physical Exam  Constitutional: She is oriented to person, place, and time. She appears well-nourished.  Respiratory: Effort normal.  Neurological: She is alert and oriented to person, place, and time.  Psychiatric: She has a normal mood and affect.           Assessment & Plan:

## 2018-06-18 NOTE — Assessment & Plan Note (Signed)
Compliant to reduced dose of levothyroxine 125 mcg, taking appropriately. Repeat TSH pending. Will provide refills once TSH results.

## 2018-06-19 ENCOUNTER — Other Ambulatory Visit: Payer: Self-pay | Admitting: Primary Care

## 2018-06-19 DIAGNOSIS — E039 Hypothyroidism, unspecified: Secondary | ICD-10-CM

## 2018-06-21 ENCOUNTER — Other Ambulatory Visit: Payer: Self-pay | Admitting: Primary Care

## 2018-06-21 DIAGNOSIS — E039 Hypothyroidism, unspecified: Secondary | ICD-10-CM

## 2018-06-21 MED ORDER — LEVOTHYROXINE SODIUM 100 MCG PO TABS: ORAL_TABLET | ORAL | 1 refills | Status: DC

## 2018-06-21 NOTE — Telephone Encounter (Signed)
Last prescribed on 04/27/2018  . Last office visit ([facility])  On 06/18/2018. No future appointment

## 2018-06-23 ENCOUNTER — Telehealth: Payer: Self-pay

## 2018-06-23 NOTE — Telephone Encounter (Signed)
[facility] Night - Client Nonclinical Telephone Record [facility] Night - Client Client Site [place] - Night Physician Alma Friendly - NP Contact Type Call Who Is Calling Patient / Member / Family / Caregiver Caller Name Shelley Phone Number [phone] Call Type Message Only Information Provided Reason for Call Returning a Call from the Office Initial Molly Wall states she is returning a call from the office. Additional Comment Call Closed By: Asencion Islam Transaction Date/Time: 06/22/2018 5:48:51 PM (ET)

## 2018-06-23 NOTE — Telephone Encounter (Signed)
Yes. Spoken and notified patient of Tawni Millers comments. Patient verbalized understanding.

## 2018-06-25 ENCOUNTER — Ambulatory Visit: Payer: BLUE CROSS/BLUE SHIELD

## 2018-06-25 ENCOUNTER — Ambulatory Visit
Admission: RE | Admit: 2018-06-25 | Discharge: 2018-06-25 | Disposition: A | Discharge status: Home / Self Care | Payer: BLUE CROSS/BLUE SHIELD | Source: Ambulatory Visit | Attending: Oncology | Admitting: Oncology

## 2018-06-25 ENCOUNTER — Other Ambulatory Visit: Payer: Self-pay

## 2018-06-25 DIAGNOSIS — N6091 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: Secondary | ICD-10-CM | POA: Diagnosis not present

## 2018-06-25 DIAGNOSIS — Z803 Family history of malignant neoplasm of breast: Secondary | ICD-10-CM

## 2018-07-01 ENCOUNTER — Ambulatory Visit: Payer: BLUE CROSS/BLUE SHIELD

## 2018-07-07 ENCOUNTER — Other Ambulatory Visit: Payer: Self-pay | Admitting: Primary Care

## 2018-07-07 MED ORDER — ONETOUCH DELICA PLUS LANCET33G MISC: 1.0000 | Freq: Three times a day (TID) | 2 refills | Status: DC

## 2018-07-08 ENCOUNTER — Encounter: Payer: BLUE CROSS/BLUE SHIELD | Attending: Primary Care | Admitting: Dietician

## 2018-07-08 ENCOUNTER — Encounter: Payer: Self-pay | Admitting: Dietician

## 2018-07-08 ENCOUNTER — Other Ambulatory Visit: Payer: Self-pay

## 2018-07-08 VITALS — BP 124/70 | Ht 65.0 in | Wt 185.5 lb

## 2018-07-08 DIAGNOSIS — E119 Type 2 diabetes mellitus without complications: Secondary | ICD-10-CM | POA: Diagnosis not present

## 2018-07-08 DIAGNOSIS — Z794 Long term (current) use of insulin: Secondary | ICD-10-CM | POA: Diagnosis not present

## 2018-07-08 NOTE — Progress Notes (Signed)

## 2018-07-12 ENCOUNTER — Encounter: Payer: Self-pay | Admitting: Dietician

## 2018-07-28 ENCOUNTER — Other Ambulatory Visit: Payer: Self-pay | Admitting: Primary Care

## 2018-07-28 DIAGNOSIS — E119 Type 2 diabetes mellitus without complications: Secondary | ICD-10-CM

## 2018-07-28 DIAGNOSIS — Z794 Long term (current) use of insulin: Secondary | ICD-10-CM

## 2018-07-30 ENCOUNTER — Other Ambulatory Visit: Payer: Self-pay | Admitting: Primary Care

## 2018-07-30 ENCOUNTER — Ambulatory Visit (INDEPENDENT_AMBULATORY_CARE_PROVIDER_SITE_OTHER): Payer: BLUE CROSS/BLUE SHIELD | Admitting: Primary Care

## 2018-07-30 ENCOUNTER — Encounter: Payer: Self-pay | Admitting: Primary Care

## 2018-07-30 ENCOUNTER — Other Ambulatory Visit: Payer: Self-pay

## 2018-07-30 VITALS — BP 124/80 | HR 82 | Temp 98.1°F | Ht 65.0 in | Wt 183.0 lb

## 2018-07-30 DIAGNOSIS — E119 Type 2 diabetes mellitus without complications: Secondary | ICD-10-CM

## 2018-07-30 DIAGNOSIS — E039 Hypothyroidism, unspecified: Secondary | ICD-10-CM

## 2018-07-30 DIAGNOSIS — Z794 Long term (current) use of insulin: Secondary | ICD-10-CM

## 2018-07-30 LAB — TSH: TSH: 0.16 u[IU]/mL — ABNORMAL LOW (ref 0.35–4.50)

## 2018-07-30 LAB — POCT GLYCOSYLATED HEMOGLOBIN (HGB A1C): Hemoglobin A1C: 8.2 % — AB (ref 4.0–5.6)

## 2018-07-30 MED ORDER — VICTOZA 18 MG/3ML ~~LOC~~ SOPN: 1.8000 mg | PEN_INJECTOR | Freq: Every day | SUBCUTANEOUS | 5 refills | Status: DC

## 2018-07-30 MED ORDER — BASAGLAR KWIKPEN 100 UNIT/ML ~~LOC~~ SOPN: 45.0000 [IU] | PEN_INJECTOR | SUBCUTANEOUS | 5 refills | Status: DC

## 2018-07-30 MED ORDER — LEVOTHYROXINE SODIUM 75 MCG PO TABS: ORAL_TABLET | ORAL | 1 refills | Status: DC

## 2018-07-30 NOTE — Progress Notes (Signed)
Subjective:    Patient ID: Molly Wall, female    DOB: [birth year], 73 y.o.   MRN: [id number]  HPI  Ms. Wall is a 73 year old female who presents today for follow up of diabetes and hypothyroidism.  1) Type 2 Diabetes:  Current medications include: Victoza 1.8 mg daily, Metformin ER 1000 mg daily, Basaglar 45 units HS.  She is checking her blood glucose 2 times daily and is getting readings of:  AM fasting: 80's-low 100's Bedtime: mid 100's upper 200's.   Last A1C: 11.1 in February 2020 Last Eye Exam: Due in November 2020 Last Foot Exam: Due  Pneumonia Vaccination: Completed in 2016 ACE/ARB: None Urine microalbumin negative Statin: atorvastatin   Diet currently consists of: She is following with diabetic nutritionist from [facility]  Breakfast: Fruit, peanut butter, now english muffin Lunch: Left overs Dinner: Meat, vegetable, starch (some take out food) Snacks: Nuts Desserts: Daily, small portion, lower calorie Beverages: Water, sparkling water, flavored water (sugar free)  Exercise: She is using her recumbent bicycle 6 days weekly, 30 minutes  2) Hypothyroidism: Currently managed on levothyroxine 100 mcg which is a reduction from 6 weeks ago due to TSH of 0.05. She is taking her levothyroxine every morning with water only, no food or other medications for 30 minutes.    Review of Systems  Respiratory: Negative for shortness of breath.   Cardiovascular: Negative for chest pain.  Neurological: Negative for dizziness and numbness.       Past Medical History:  Diagnosis Date  . Allergic rhinitis   . Allergy   . Asthma   . Atypical ductal hyperplasia of right breast   . 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 Breast cancer ([place])   . Diabetes mellitus without complication ([place])   . Family history of breast cancer   . Family history of colon cancer   . Hypercholesteremia   . Hypothyroidism   . Lumbar herniated disc   . OSA (obstructive sleep  apnea)    used to use CPAP, not any more over 1 1/2 yrs  . Sleep apnea    no cpap in 2 yrs   . Vitamin D deficiency      Social History   Socioeconomic History  . Marital status: Divorced    Spouse name: Not on file  . Number of children: 2  . Years of education: Not on file  . Highest education level: Not on file  Occupational History  . Not on file  Social Needs  . Financial resource strain: Not on file  . Food insecurity:    Worry: Not on file    Inability: Not on file  . Transportation needs:    Medical: Not on file    Non-medical: Not on file  Tobacco Use  . Smoking status: Former Smoker    Types: Cigarettes  . Smokeless tobacco: Never Used  Substance and Sexual Activity  . Alcohol use: Yes    Alcohol/week: 0.0 - 1.0 standard drinks    Comment: 0-1 per month  . Drug use: No  . Sexual activity: Not Currently  Lifestyle  . Physical activity:    Days per week: Not on file    Minutes per session: Not on file  . Stress: Not on file  Relationships  . Social connections:    Talks on phone: Not on file    Gets together: Not on file    Attends religious service: Not on file  Active member of club or organization: Not on file    Attends meetings of clubs or organizations: Not on file    Relationship status: Not on file  . Intimate partner violence:    Fear of current or ex partner: Not on file    Emotionally abused: Not on file    Physically abused: Not on file    Forced sexual activity: Not on file  Other Topics Concern  . Not on file  Social History Narrative   Single.   Moved from [place] to [place] several weeks ago.   Family lives in [place].   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    Past Surgical History:  Procedure Laterality Date  . APPENDECTOMY    . BREAST LUMPECTOMY WITH RADIOACTIVE SEED LOCALIZATION Right 08/12/2017   Procedure: RIGHT BREAST LUMPECTOMY X'S 2  WITH RADIOACTIVE SEED LOCALIZATION X'S 2;  Surgeon: Fanny Skates, MD;  Location: [facility];  Service: General;  Laterality: Right;  . COLONOSCOPY     11 yrs ago in [place] but given a 10 yr recall per pt   . GANGLION CYST EXCISION     73 years old  . INCISION AND DRAINAGE ABSCESS ANAL    . KNEE SURGERY  2014  . SHOULDER SURGERY  1976    Family History  Problem Relation Age of Onset  . Alzheimer's disease Mother   . Asthma Mother   . Dementia Mother        cause of death  . Arthritis Father   . Heart attack Father 33       cause of death  . Breast cancer Sister 77  . Colon cancer Paternal Grandmother   . Liver cancer Maternal Uncle   . Colon polyps Neg Hx   . Rectal cancer Neg Hx   . Stomach cancer Neg Hx     Allergies  Allergen Reactions  . Iodine Anaphylaxis    Radioactive iodine dye    Current Outpatient Medications on File Prior to Visit  Medication Sig Dispense Refill  . albuterol (PROAIR HFA) 108 (90 Base) MCG/ACT inhaler Inhale 2 puffs into the lungs every 6 (six) hours as needed for wheezing or shortness of breath. NEED APPOINTMENT FOR ANY MORE REFILLS 18 g 0  . anastrozole (ARIMIDEX) 1 MG tablet Take 1 tablet (1 mg total) by mouth daily. 90 tablet 4  . atorvastatin (LIPITOR) 20 MG tablet Take 1 tablet by mouth daily for cholesterol. 90 tablet 3  . B-D UF III MINI PEN NEEDLES 31G X 5 MM MISC USE AS DIRECTED USE DAILY TO ADMINISTER VICTOZA 100 each 5  . fluticasone (FLONASE) 50 MCG/ACT nasal spray Place 2 sprays into both nostrils daily. 48 g 1  . Fluticasone-Salmeterol (ADVAIR) 250-50 MCG/DOSE AEPB Inhale 1 puff into the lungs. 1 puff daily as needed    . Insulin Glargine (BASAGLAR KWIKPEN) 100 UNIT/ML SOPN Inject 0.4 mLs (40 Units total) into the skin every morning. 15 mL 5  . Lancets (ONETOUCH DELICA PLUS QZESPQ33A) MISC Inject 1 Device into the skin 3 (three) times daily. To check blood sugar 100 each 2  . levothyroxine (SYNTHROID)  100 MCG tablet Take 1 tablet by mouth every morning on an empty stomach with water. No food or other medications for 30 min. 30 tablet 1  . metformin (FORTAMET) 1000 MG (OSM) 24 hr tablet Take 1 tablet (1,000 mg  total) by mouth daily with breakfast. 90 tablet 3  . montelukast (SINGULAIR) 10 MG tablet Take 1 tablet (10 mg total) by mouth at bedtime. For allergies and asthma. 90 tablet 3  . ONETOUCH VERIO test strip USE AS DIRECTED TO CHECK FASTING BLOOD SUGARS three times DAILY. 100 each 3  . VICTOZA 18 MG/3ML SOPN Inject 0.3 mLs (1.8 mg total) into the skin daily. For diabetes. 3 mL 5   No current facility-administered medications on file prior to visit.     BP 124/80   Pulse 82   Temp 98.1 F (36.7 C) (Oral)   Ht 5\' 5"  (1.651 m)   Wt 183 lb (83 kg)   SpO2 97%   BMI 30.45 kg/m    Objective:   Physical Exam  Constitutional: She is oriented to person, place, and time. She appears well-nourished.  Cardiovascular: Normal rate.  Respiratory: Effort normal.  Neurological: She is alert and oriented to person, place, and time.  Skin: Skin is warm and dry.  Psychiatric: She has a normal mood and affect.           Assessment & Plan:

## 2018-07-30 NOTE — Assessment & Plan Note (Signed)
Repeat A1C today of 8.2 which is a significant improvement from 11.1 three months ago. Commended her on this improvement.  Managed on statin. Urine microalbumin negative. Foot exam today.  Continue current regimen.  Continue to check glucose. Follow up in 3 months.

## 2018-07-30 NOTE — Patient Instructions (Signed)
Continue taking levothyroxine 100 mcg for now.  Continue your current medications for diabetes. Congratulations on your A1C!! Keep up the great work.  Continue exercising. You should be getting 150 minutes of moderate intensity exercise weekly.  Stop by the lab prior to leaving today. I will notify you of your results once received.   Please schedule a follow up appointment in 3 months.  It was a pleasure to see you today!

## 2018-07-30 NOTE — Assessment & Plan Note (Signed)
Taking levothyroxine appropriately. Repeat TSH pending.

## 2018-08-03 ENCOUNTER — Ambulatory Visit: Payer: Self-pay

## 2018-08-03 ENCOUNTER — Encounter: Payer: Self-pay | Admitting: *Deleted

## 2018-08-03 DIAGNOSIS — K1120 Sialoadenitis, unspecified: Secondary | ICD-10-CM | POA: Diagnosis not present

## 2018-08-03 NOTE — Telephone Encounter (Signed)
Pt daughter called to say her mother has a ping pong ball size lump on the side of her left jaw.  She states it started yesterday and her mother said when she pushed on the area she could feel fluid release. Today Ms McDill saw her dentist who states she has no dental issue and should see an ENT specialist.  She rates pain at 6 and states she can not close her teeth together. No fever Per protocol pt will go to ER for evaluation of her symptoms. Care advice read to patient and her daughter Mendel Ryder. Both verbalized understanding of instuctions  Reason for Disposition . Patient sounds very sick or weak to the triager  Answer Assessment - Initial Assessment Questions 1. ONSET: "When did the swelling start?" (e.g., minutes, hours, days)     Last night 2. LOCATION: "What part of the face is swollen?"     Left jaw 3. SEVERITY: "How swollen is it?"     Can  Not close teeth together ping pong ball 4. ITCHING: "Is there any itching?" If so, ask: "How much?"   (Scale 1-10; mild, moderate or severe)    no 5. PAIN: "Is the swelling painful to touch?" If so, ask: "How painful is it?"   (Scale 1-10; mild, moderate or severe)     6 6. FEVER: "Do you have a fever?" If so, ask: "What is it, how was it measured, and when did it start?"      no 7. CAUSE: "What do you think is causing the face swelling?"    Unsure went to dentist 8. RECURRENT SYMPTOM: "Have you had face swelling before?" If so, ask: "When was the last time?" "What happened that time?"    never 9. OTHER SYMPTOMS: "Do you have any other symptoms?" (e.g., toothache, leg swelling)    no 10. PREGNANCY: "Is there any chance you are pregnant?" "When was your last menstrual period?"      N/A  Protocols used: [facility]

## 2018-08-04 ENCOUNTER — Other Ambulatory Visit: Payer: Self-pay | Admitting: Otolaryngology

## 2018-08-04 DIAGNOSIS — R221 Localized swelling, mass and lump, neck: Secondary | ICD-10-CM | POA: Diagnosis not present

## 2018-08-04 DIAGNOSIS — K1121 Acute sialoadenitis: Secondary | ICD-10-CM | POA: Diagnosis not present

## 2018-08-04 NOTE — Telephone Encounter (Signed)
I spoke with Lindsey(DPR signed)and pt went to [facility] 08/03/18 and pt was given abx and pt dx clogged salivary gland. Pt has appt to see ENT today at [facility] at 9:45. FYI to Gentry Fitz NP.

## 2018-08-04 NOTE — Telephone Encounter (Signed)
Noted  

## 2018-08-11 ENCOUNTER — Other Ambulatory Visit: Payer: Self-pay

## 2018-08-11 ENCOUNTER — Ambulatory Visit
Admission: RE | Admit: 2018-08-11 | Discharge: 2018-08-11 | Disposition: A | Discharge status: Home / Self Care | Payer: BLUE CROSS/BLUE SHIELD | Source: Ambulatory Visit | Attending: Otolaryngology | Admitting: Otolaryngology

## 2018-08-11 DIAGNOSIS — R221 Localized swelling, mass and lump, neck: Secondary | ICD-10-CM

## 2018-08-11 DIAGNOSIS — K118 Other diseases of salivary glands: Secondary | ICD-10-CM | POA: Diagnosis not present

## 2018-08-11 LAB — POCT I-STAT CREATININE: Creatinine, Ser: 0.5 mg/dL (ref 0.44–1.00)

## 2018-08-11 MED ORDER — IOHEXOL 300 MG/ML  SOLN
75.0000 mL | Freq: Once | INTRAMUSCULAR | Status: AC | PRN
  Administered 2018-08-11: 75 mL via INTRAVENOUS

## 2018-08-13 ENCOUNTER — Other Ambulatory Visit: Payer: Self-pay | Admitting: Primary Care

## 2018-08-13 DIAGNOSIS — Z794 Long term (current) use of insulin: Secondary | ICD-10-CM

## 2018-08-13 DIAGNOSIS — E039 Hypothyroidism, unspecified: Secondary | ICD-10-CM

## 2018-08-13 DIAGNOSIS — E119 Type 2 diabetes mellitus without complications: Secondary | ICD-10-CM

## 2018-08-24 ENCOUNTER — Telehealth: Payer: Self-pay | Admitting: Primary Care

## 2018-08-24 DIAGNOSIS — M542 Cervicalgia: Secondary | ICD-10-CM | POA: Diagnosis not present

## 2018-08-24 DIAGNOSIS — M5441 Lumbago with sciatica, right side: Secondary | ICD-10-CM

## 2018-08-24 DIAGNOSIS — Z79811 Long term (current) use of aromatase inhibitors: Secondary | ICD-10-CM | POA: Diagnosis not present

## 2018-08-24 DIAGNOSIS — E119 Type 2 diabetes mellitus without complications: Secondary | ICD-10-CM | POA: Diagnosis not present

## 2018-08-24 DIAGNOSIS — K1120 Sialoadenitis, unspecified: Secondary | ICD-10-CM | POA: Diagnosis not present

## 2018-08-24 DIAGNOSIS — M5126 Other intervertebral disc displacement, lumbar region: Secondary | ICD-10-CM

## 2018-08-24 DIAGNOSIS — G8929 Other chronic pain: Secondary | ICD-10-CM

## 2018-08-24 NOTE — Telephone Encounter (Signed)
Please notify patient that I'm happy to help but need some additional information.  1. Is this MD with pain management or ortho? 2. When was her last visit? 3. Where is her pain? 4. When does she leave the country?

## 2018-08-24 NOTE — Telephone Encounter (Signed)
Copied from [place] [id number]. Topic: General - Inquiry >> Aug 23, 2018  4:15 PM Virl Axe D wrote: Reason for CRM: Pt has started to have problems with her herniated disk and would like to be referred to Dr. Lowella Dandy again so she can start getting epidurals again before she goes out of the country. Please advise. CB#[phone] Office closed

## 2018-08-25 ENCOUNTER — Telehealth: Payer: Self-pay | Admitting: Primary Care

## 2018-08-25 NOTE — Telephone Encounter (Signed)
Per [name].  Patient said she's leaving the country in September for 3 months.  Patient said her back slipped a week ago and she needs a referral for your insurance to go to Dr. Candy Sledge, he does the epidurals at [facility].  Patient said she gets them 1 month apart and she'll need about 3 and she needs to get them done before she leaves.  Patient said she was referred to Dr. Candy Sledge by Anda Kraft before.   Her last visit was about 2 years ago. Her pain is in the lower back area.

## 2018-08-25 NOTE — Addendum Note (Signed)
Addended by: [name] on: 08/25/2018 04:32 PM   Modules accepted: Orders

## 2018-08-25 NOTE — Telephone Encounter (Signed)
Noted, referral placed.  

## 2018-08-25 NOTE — Telephone Encounter (Signed)
Patient said she's leaving the country in September for 3 months.  Patient said her back slipped a week ago and she needs a referral for your insurance to go to Dr. Candy Sledge, he does the epidurals at [facility].  Patient said she gets them 1 month apart and she'll need about 3 and she needs to get them done before she leaves.  Patient said she was referred to Dr. Candy Sledge by Anda Kraft before.

## 2018-08-27 ENCOUNTER — Other Ambulatory Visit: Payer: Self-pay | Admitting: Primary Care

## 2018-08-27 DIAGNOSIS — Z794 Long term (current) use of insulin: Secondary | ICD-10-CM

## 2018-08-27 DIAGNOSIS — E119 Type 2 diabetes mellitus without complications: Secondary | ICD-10-CM

## 2018-08-27 NOTE — Telephone Encounter (Signed)
Spoken to patient and referral was already placed. She should be getting a call soon.

## 2018-08-27 NOTE — Telephone Encounter (Signed)
Patient called back and said she'd like to speak to [place].  Patient said she needs the referral as soon as possible. She can be reached at  [phone].

## 2018-08-30 NOTE — Progress Notes (Signed)
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   She continues on anastrozole. She tolerates this well and without any noticeable side effects.    Since her last visit, she underwent bilateral diagnostic mammography with tomography at [facility] on 06/25/2018 showing: breast density category B; no evidence of malignancy in either breast.  Vaughan Basta does not have a bone density screening on file.   REVIEW OF SYSTEMS: Andree reports a pain on her left upper arm in the last few weeks. She uses a recumbent bike for exercise, usually 5 times a week. She was staying with her daughter the last week, so she did not exercise. Her sugars are going down, and she notes she is doing well on her diet. Today her sugars were at 127, which she states this is the highest it's been in the last few weeks. She is concerned about a reading of 39 yesterday, but she denies any changes physically. She states her A1C went from 11 down to 8. She also reports headaches the last couple of weeks. She attributes them to issue with her neck, noting she used to see a chiropractor but has not for over a year. She also reports a bump on her scalp. A detailed review of systems was otherwise  entirely negative.  Since her last visit, she underwent neck CT on 08/11/2018 for jaw pain from possible salivary gland blockage. This showed: ill-defined area of hyper enhancement in the left posterior parotid gland, question acute or chronic inflammation; 1 mm nonobstructing calcification left inferior submandibular gland without evidence of acute inflammation.    HISTORY OF CURRENT ILLNESS: From the original intake note:  Betzaira Mentel McDill had routine screening mammography on 06/18/2017 showing a possible abnormality in the right breast. She underwent unilateral right diagnostic mammography with tomography [facility] on 06/25/2017 showing: breast density category B. There were multiple scattered groups of suspicious fine pleomorphic and amorphous calcifications in the right breast.  Accordingly on 06/26/2017 she proceeded to biopsy of the right breast area in question. The pathology from this procedure showed ([id number]): Atypical ductal hyperplasia in the posterior and anterior regions and Atypical lobular hyperplasia in the posterior brest  She underwent right lumpectomy of these two areas ([id number]) on 08/12/2017 with pathology showing: In the right inferior retroaerolar area and right upper inner quadrant, there was  fibrocystic changes with calcifications with no evidence of malignancy.  The patient's subsequent history is as detailed below.   PAST MEDICAL HISTORY: Past Medical History:  Diagnosis Date   Allergic rhinitis    Allergy    Asthma    Atypical ductal hyperplasia of right breast    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   Breast cancer ([facility] [place])    Diabetes mellitus without complication (HCC)    Family history of breast cancer    Family history of colon cancer  Hypercholesteremia    Hypothyroidism    Lumbar herniated disc    OSA (obstructive sleep apnea)    used to use CPAP, not any more over 1 1/2 yrs   Sleep apnea    no cpap  in 2 yrs    Vitamin D deficiency     PAST SURGICAL HISTORY: Past Surgical History:  Procedure Laterality Date   APPENDECTOMY     BREAST LUMPECTOMY WITH RADIOACTIVE SEED LOCALIZATION Right 08/12/2017   Procedure: RIGHT BREAST LUMPECTOMY X'S 2 WITH RADIOACTIVE SEED LOCALIZATION X'S 2;  Surgeon: Fanny Skates, MD;  Location: [facility];  Service: General;  Laterality: Right;   COLONOSCOPY     11 yrs ago in [place] but given a 10 yr recall per pt    GANGLION CYST EXCISION     73 years old   [place]  2014   SHOULDER SURGERY  1976    FAMILY HISTORY Family History  Problem Relation Age of Onset   Alzheimer's disease Mother    Asthma Mother    Dementia Mother        cause of death   Arthritis Father    Heart attack Father 59       cause of death   Breast cancer Sister 56   Colon cancer Paternal Grandmother    Liver cancer Maternal Uncle    Colon polyps Neg Hx    Rectal cancer Neg Hx    Stomach cancer Neg Hx    The patient's mother, [name], died at age 69 in 09-21-2017 with dementia. The patient's father, [name], died at age 107 due to heart attack and other heart issues. The patient had 1 brother and 2 sisters. One of the patient's sisters died of breast cancer at age 1 and was diagnosed at age 11. There was a maternal uncle who died of liver cancer. There was a paternal grandmother who had colon cancer.    GYNECOLOGIC HISTORY:  No LMP recorded. Patient is postmenopausal. Menarche: 73 years old Age at first live birth: 73 years old She is [place] P2. Her LMP was around age 73. She took HRT for a few months (less than a year).    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  She is divorced. She lives alone, with no pets. The patient's daughter [name] is age 67 and lives in [place], [place] as a homemaker and home schools her children. [name] has a  Conservator, museum/gallery in [employer] in education and domestic violence. The patient's daughter [name] is age 22, lives in [place], and also home schools her children. The patient has 4 grand children.  She attends [employer]vertising account [employer], where her son-in-law is one of the pastors.    ADVANCED DIRECTIVES: in place 12/10/2017, son-in-law Levada Dy. named [place]: Social History   Tobacco Use   Smoking status: Former Smoker    Types: Cigarettes   Smokeless tobacco: Never Used  Substance Use Topics   Alcohol use: Yes    Alcohol/week: 0.0 - 1.0 standard drinks    Comment: 0-1 per month   Drug use: No     Colonoscopy: 09/15/2017 / Dr. Loletha Carrow / found small/large diverticula and polyp removal.  PAP:   Bone density: remote/ normal  Lipid panel: 12/09/2017, worsened  Allergies  Allergen Reactions   Iodine-131 Anaphylaxis  Pt states 40 years ago she drank an experimental Iodinated Radioisotope for her thyroid. She described it was a small amount in a milk carton in [place]. She states they stopped using it shortly after because the trial wasn't successful. SPM    Current Outpatient Medications  Medication Sig Dispense Refill   albuterol (PROAIR HFA) 108 (90 Base) MCG/ACT inhaler Inhale 2 puffs into the lungs every 6 (six) hours as needed for wheezing or shortness of breath. NEED APPOINTMENT FOR ANY MORE REFILLS 18 g 0   anastrozole (ARIMIDEX) 1 MG tablet Take 1 tablet (1 mg total) by mouth daily. 90 tablet 4   atorvastatin (LIPITOR) 20 MG tablet Take 1 tablet by mouth daily for cholesterol. 90 tablet 3   B-D UF III MINI PEN NEEDLES 31G X 5 MM MISC USE AS DIRECTED USE DAILY TO ADMINISTER VICTOZA 100 each 5   fluticasone (FLONASE) 50 MCG/ACT nasal spray Place 2 sprays into both nostrils daily. 48 g 1   Fluticasone-Salmeterol (ADVAIR) 250-50 MCG/DOSE AEPB Inhale 1 puff into the lungs. 1 puff daily as needed     Insulin  Glargine (BASAGLAR KWIKPEN) 100 UNIT/ML SOPN Inject 0.45 mLs (45 Units total) into the skin every morning. 15 mL 5   Lancets (ONETOUCH DELICA PLUS [id number]) MISC Inject 1 Device into the skin 3 (three) times daily. To check blood sugar 100 each 2   levothyroxine (SYNTHROID) 75 MCG tablet Take 1 tablet by mouth every morning on an empty stomach. No food or other medications for 30 minutes. 30 tablet 1   metformin (FORTAMET) 1000 MG (OSM) 24 hr tablet Take 1 tablet (1,000 mg total) by mouth daily with breakfast. 90 tablet 3   montelukast (SINGULAIR) 10 MG tablet Take 1 tablet (10 mg total) by mouth at bedtime. For allergies and asthma. 90 tablet 3   ONETOUCH VERIO test strip USE AS DIRECTED TO CHECK FASTING BLOOD SUGARS THREE TIMES DAILY. 100 each 3   VICTOZA 18 MG/3ML SOPN Inject 0.3 mLs (1.8 mg total) into the skin daily. For diabetes. 3 mL 5   No current facility-administered medications for this visit.     OBJECTIVE: Middle-aged white woman in no acute distress  Vitals:   08/31/18 1050  BP: 118/60  Pulse: 68  Resp: 19  Temp: 97.8 F (36.6 C)  SpO2: 96%     Body mass index is 31.2 kg/m.   Wt Readings from Last 3 Encounters:  08/31/18 187 lb 8 oz (85 kg)  07/30/18 183 lb (83 kg)  07/08/18 185 lb 8 oz (84.1 kg)      ECOG FS:1 - Symptomatic but completely ambulatory  Sclerae unicteric, EOMs intact Wearing a mask No cervical or supraclavicular adenopathy Lungs no rales or rhonchi Heart regular rate and rhythm Abd soft, nontender, positive bowel sounds MSK no focal spinal tenderness; the area in the left upper extremity that she has a little soreness and is completely benign by exam and inspection Neuro: nonfocal, well oriented, appropriate affect Breasts: The right breast is status post lumpectomy, with no evidence of disease activity.  The left breast is unremarkable.  Both axillae are benign.    LAB RESULTS:  CMP     Component Value Date/Time   NA 132 (L)  12/09/2017 1446   K 4.4 12/09/2017 1446   CL 95 (L) 12/09/2017 1446   CO2 29 12/09/2017 1446   GLUCOSE 316 (H) 12/09/2017 1446   BUN 13 12/09/2017 1446   CREATININE 0.50 08/11/2018 4196  CALCIUM 9.9 12/09/2017 1446   PROT 7.0 12/09/2017 1446   ALBUMIN 4.2 12/09/2017 1446   AST 12 12/09/2017 1446   ALT 13 12/09/2017 1446   ALKPHOS 110 12/09/2017 1446   BILITOT 0.3 12/09/2017 1446   GFRNONAA >60 08/06/2017 1200   GFRAA >60 08/06/2017 1200    No results found for: TOTALPROTELP, ALBUMINELP, A1GS, A2GS, BETS, BETA2SER, GAMS, MSPIKE, SPEI  No results found for: KPAFRELGTCHN, LAMBDASER, [facility], [place]  Lab Results  Component Value Date   WBC 4.8 08/06/2017   NEUTROABS 2.9 08/06/2017   HGB 14.4 08/06/2017   HCT 45.8 08/06/2017   MCV 85.0 08/06/2017   PLT 230 08/06/2017    '@LASTCHEMISTRY' @  No results found for: LABCA2  No components found for: [id number]  No results for input(s): INR in the last 168 hours.  No results found for: LABCA2  No results found for: ZHY865  No results found for: HQI696  No results found for: EXB284  No results found for: CA2729  No components found for: HGQUANT  No results found for: CEA1 / No results found for: CEA1   No results found for: AFPTUMOR  No results found for: CHROMOGRNA  No results found for: PSA1  No visits with results within 3 Day(s) from this visit.  Latest known visit with results is:  Hospital Outpatient Visit on 08/11/2018  Component Date Value Ref Range Status   Creatinine, Ser 08/11/2018 0.50  0.44 - 1.00 mg/dL Final    (this displays the last labs from the last 3 days)  No results found for: TOTALPROTELP, ALBUMINELP, A1GS, A2GS, BETS, BETA2SER, GAMS, MSPIKE, SPEI (this displays SPEP labs)  No results found for: KPAFRELGTCHN, LAMBDASER, KAPLAMBRATIO (kappa/lambda light chains)  No results found for: HGBA, HGBA2QUANT, HGBFQUANT, HGBSQUAN (Hemoglobinopathy evaluation)   No results found for: LDH  No  results found for: IRON, TIBC, IRONPCTSAT (Iron and TIBC)  No results found for: FERRITIN  Urinalysis No results found for: COLORURINE, APPEARANCEUR, LABSPEC, PHURINE, GLUCOSEU, HGBUR, BILIRUBINUR, KETONESUR, PROTEINUR, UROBILINOGEN, NITRITE, LEUKOCYTESUR   STUDIES: MRI results discussed with the patient and copy of pathology report given to the patient  ELIGIBLE FOR AVAILABLE RESEARCH PROTOCOL: no   ASSESSMENT: 73 y.o. [place], [place] woman status post right breast biopsy x2 06/26/2017 showing atypical ductal hyperplasia and atypical lobular hyperplasia  (1) right lumpectomy x2 on 08/12/2017 shows no evidence of malignancy at either site  (2) genetics testing 11/23/2017 through the Hereditary Gene Panel offered by [facility] found no deleterious mutations in APC, ATM, AXIN2, BARD1, BMPR1A, BRCA1, BRCA2, BRIP1, CDH1, CDK4, CDKN2A (p14ARF), CDKN2A (p16INK4a), CHEK2, CTNNA1, DICER1, EPCAM (Deletion/duplication testing only), GREM1 (promoter region deletion/duplication testing only), KIT, MEN1, MLH1, MSH2, MSH3, MSH6, MUTYH, NBN, NF1, NHTL1, PALB2, PDGFRA, PMS2, POLD1, POLE, PTEN, RAD50, RAD51C, RAD51D, SDHB, SDHC, SDHD, SMAD4, SMARCA4. STK11, TP53, TSC1, TSC2, and VHL.  The following genes were evaluated for sequence changes only: SDHA and HOXB13 c.251G>A variant only.  (3) breast cancer high risk: intensified screening:  (a) yearly mammography in May  (b) yearly breast MRI in November  (c) biannual MD breast exam  (4) breast cancer high risk: risk reduction  (a) anastrozole started 01/05/2018   PLAN: Margaretta is tolerating anastrozole well and the plan will be to continue that a total of 5 years.  We will obtain a bone density with her next mammogram, April of next year  She is scheduled for an MRI of the breast in December after she returns from her [place] trip  She will have a  second breast exam by her primary care physician before she leaves for [place]  I reassured her that the  discomfort she is experiencing in her left upper arm is benign.  I am delighted that she is taking her diabetes seriously and she has done a terrific job at bringing her A1c down  She will see me again in 1 year.  She knows to call for any other issue that may develop before then.    Amandeep Hogston, Virgie Dad, MD  08/31/18 11:28 AM Medical Oncology and Hematology [facility] [address] [place], [place] [address] Tel. [phone]    Fax. [phone]   I, Wilburn Mylar, am acting as scribe for Dr. Virgie Dad. Zubair Lofton.  I, Lurline Del MD, have reviewed the above documentation for accuracy and completeness, and I agree with the above.

## 2018-08-31 ENCOUNTER — Inpatient Hospital Stay: Payer: BC Managed Care – PPO | Attending: Oncology | Admitting: Oncology

## 2018-08-31 ENCOUNTER — Other Ambulatory Visit: Payer: Self-pay

## 2018-08-31 VITALS — BP 118/60 | HR 68 | Temp 97.8°F | Resp 19 | Ht 65.0 in | Wt 187.5 lb

## 2018-08-31 DIAGNOSIS — Z79899 Other long term (current) drug therapy: Secondary | ICD-10-CM | POA: Insufficient documentation

## 2018-08-31 DIAGNOSIS — Z7951 Long term (current) use of inhaled steroids: Secondary | ICD-10-CM | POA: Diagnosis not present

## 2018-08-31 DIAGNOSIS — Z79811 Long term (current) use of aromatase inhibitors: Secondary | ICD-10-CM | POA: Diagnosis not present

## 2018-08-31 DIAGNOSIS — E119 Type 2 diabetes mellitus without complications: Secondary | ICD-10-CM | POA: Insufficient documentation

## 2018-08-31 DIAGNOSIS — Z803 Family history of malignant neoplasm of breast: Secondary | ICD-10-CM | POA: Diagnosis not present

## 2018-08-31 DIAGNOSIS — Z794 Long term (current) use of insulin: Secondary | ICD-10-CM | POA: Insufficient documentation

## 2018-08-31 DIAGNOSIS — N6091 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: Secondary | ICD-10-CM | POA: Insufficient documentation

## 2018-08-31 DIAGNOSIS — Z800 Family history of malignant neoplasm of digestive organs: Secondary | ICD-10-CM | POA: Diagnosis not present

## 2018-08-31 DIAGNOSIS — Z87891 Personal history of nicotine dependence: Secondary | ICD-10-CM | POA: Diagnosis not present

## 2018-09-01 ENCOUNTER — Telehealth: Payer: Self-pay | Admitting: *Deleted

## 2018-09-01 NOTE — Telephone Encounter (Signed)
Patient called stating that she picked up some refills from the pharmacy yesterday and was missing her levothyroxine and metformin. Patient stated that she is getting ready to go out of town tomorrow for 2 weeks. Patient stated that she called the pharmacy and spoke to [place] and was advised that they have never filled those for her. Advised patient that we show she should have refills. Advised patient that I would call the pharmacy and find out what is going on. Called and spoke to [facility] at [facility] and was advised that the patient does have refills and she does not know why she was told that she did not.  Stanton Kidney stated that she will work on the refills and they should be ready in about an hour. Called patient back and advised her what Stanton Kidney at [facility] had said and she verbalized understanding.

## 2018-09-02 ENCOUNTER — Other Ambulatory Visit: Payer: Self-pay

## 2018-09-02 DIAGNOSIS — Z1231 Encounter for screening mammogram for malignant neoplasm of breast: Secondary | ICD-10-CM

## 2018-09-16 ENCOUNTER — Encounter: Payer: Self-pay | Admitting: Pain Medicine

## 2018-09-16 DIAGNOSIS — M4310 Spondylolisthesis, site unspecified: Secondary | ICD-10-CM | POA: Insufficient documentation

## 2018-09-16 DIAGNOSIS — M47816 Spondylosis without myelopathy or radiculopathy, lumbar region: Secondary | ICD-10-CM | POA: Insufficient documentation

## 2018-09-16 DIAGNOSIS — M47817 Spondylosis without myelopathy or radiculopathy, lumbosacral region: Secondary | ICD-10-CM | POA: Insufficient documentation

## 2018-09-16 NOTE — Progress Notes (Signed)
Patient's Name: Molly Wall  MRN: [id number]  Referring Provider: [name], NP  DOB: October 31, 1945  PCP: [name], NP  DOS: 09/20/2018  Note by: Gaspar Cola, MD  Service setting: Virtual Visit (Telephone)  Attending: Gaspar Cola, MD  Location: Telephone Encounter  Specialty: Interventional Pain Management  Patient type: Established   Pain Management Virtual Encounter Note - Virtual Visit via Telephone Telehealth (real-time audio visits between healthcare provider and patient).   Patient's Phone No.:  [phone] (home); [phone] (mobile); (Preferred) [phone] [email or web address]  [name] [place] - [address] [place] [address]Phone: [phone]: [phone]   Pre-screening note:  Our staff contacted Ms. MGreenfieldsand offered her an "in person", "face-to-face" appointment versus a telephone encounter. She indicated preferring the telephone encounter, at this time.   Primary Reason(s) for Virtual Visit: Encounter for evaluation before starting new chronic pain management plan of care (Level of risk: moderate) COVID-19*  Social distancing based on CDC ans AMA recommendations.    I contacted LLilie VezinaMcDill on 09/20/2018 via telephone.      I clearly identified myself as FGaspar Cola MD. I verified that I was speaking with the correct person using two identifiers (Name: [name], and date of birth: 108-07-1945.  Advanced Informed Consent I sought verbal advanced consent from [name] virtual visit interactions. I informed Molly Wall of possible security and privacy concerns, risks, and limitations associated with providing "not-in-person" medical evaluation and management services. I also informed Molly Wall of the availability of "in-person" appointments. Finally, I informed her that there would be a charge for the virtual visit and that she could be  personally, fully  or partially, financially responsible for it. Molly Wall expressed understanding and agreed to proceed.   Historic Elements   [name] is a 73y.o. year old, female patient evaluated today after her last encounter by our practice on Visit date not found. [name] has a past medical history of Allergic rhinitis, Allergy, Asthma, Atypical ductal hyperplasia of right breast, Atypical ductal hyperplasia of right breast (08/12/2017), Breast cancer ([place], Diabetes mellitus without complication ([place], Family history of breast cancer, Family history of colon cancer, Hypercholesteremia, Hypothyroidism, Lumbar herniated disc, OSA (obstructive sleep apnea), Sleep apnea, and Vitamin D deficiency. She also  has a past surgical history that includes Shoulder surgery (1976); Knee surgery (2014); Appendectomy; Ganglion cyst excision; Incision and drainage abscess anal; Colonoscopy; and Breast lumpectomy with radioactive seed localization (Right, 08/12/2017). Ms. MPhilip Aspenhas a current medication list which includes the following prescription(s): albuterol, anastrozole, atorvastatin, b-d uf iii mini pen needles, fluticasone, fluticasone-salmeterol, basaglar kwikpen, basaglar kwikpen, onetouch delica plus [id number] levothyroxine, metformin, montelukast, onetouch verio, and victoza. She  reports that she has quit smoking. Her smoking use included cigarettes. She has never used smokeless tobacco. She reports current alcohol use. She reports that she does not use drugs. Ms. MPhilip Aspenis allergic to Wall.   HPI  She is being evaluated for review of studies ordered on initial visit and to consider treatment plan options. Today I went over the results of her tests. These were explained in "Layman's terms". During today's appointment I went over my diagnostic impression, as well as the proposed treatment plan.  This patient was initially seen by me as a "Fast-Trak" for a right-sided L4-5 lumbar epidural steroid  injection under fluoroscopic guidance and IV sedation.  She was seen in 2018  and at that time she completed 2 injections, which completely eliminated her pain until recently when it returned.  He comes back to the clinic for possible repeat treatment.  In considering the treatment plan options, [name] was reminded that I no longer take patients for medication management only. I asked her to let me know if she had no intention of taking advantage of the interventional therapies, so that we could make arrangements to provide this space to someone interested. I also made it clear that undergoing interventional therapies for the purpose of getting pain medications is very inappropriate on the part of a patient, and it will not be tolerated in this practice. This type of behavior would suggest true addiction and therefore it requires referral to an addiction specialist.   I discussed the assessment and treatment plan with the patient. The patient was provided an opportunity to ask questions and all were answered. The patient agreed with the plan and demonstrated an understanding of the instructions.  Patient advised to call back or seek an in-person evaluation if the symptoms or condition worsens.  Meds   Current Outpatient Medications:  .  albuterol (PROAIR HFA) 108 (90 Base) MCG/ACT inhaler, Inhale 2 puffs into the lungs every 6 (six) hours as needed for wheezing or shortness of breath. NEED APPOINTMENT FOR ANY MORE REFILLS, Disp: 18 g, Rfl: 0 .  anastrozole (ARIMIDEX) 1 MG tablet, Take 1 tablet (1 mg total) by mouth daily., Disp: 90 tablet, Rfl: 4 .  atorvastatin (LIPITOR) 20 MG tablet, Take 1 tablet by mouth daily for cholesterol., Disp: 90 tablet, Rfl: 3 .  B-D UF III MINI PEN NEEDLES 31G X 5 MM MISC, USE AS DIRECTED USE DAILY TO ADMINISTER VICTOZA, Disp: 100 each, Rfl: 5 .  fluticasone (FLONASE) 50 MCG/ACT nasal spray, Place 2 sprays into both nostrils daily., Disp: 48 g, Rfl: 1 .   Fluticasone-Salmeterol (ADVAIR) 250-50 MCG/DOSE AEPB, Inhale 1 puff into the lungs. 1 puff daily as needed, Disp: , Rfl:  .  Insulin Glargine (BASAGLAR KWIKPEN) 100 UNIT/ML SOPN, Inject 0.45 mLs (45 Units total) into the skin every morning., Disp: 15 mL, Rfl: 5 .  Insulin Glargine (BASAGLAR KWIKPEN) 100 UNIT/ML SOPN, Inject 45 Units into the skin daily., Disp: , Rfl:  .  Lancets (ONETOUCH DELICA PLUS [id number]) MISC, Inject 1 Device into the skin 3 (three) times daily. To check blood sugar, Disp: 100 each, Rfl: 2 .  levothyroxine (SYNTHROID) 75 MCG tablet, Take 1 tablet by mouth every morning on an empty stomach. No food or other medications for 30 minutes., Disp: 30 tablet, Rfl: 1 .  metformin (FORTAMET) 1000 MG (OSM) 24 hr tablet, Take 1 tablet (1,000 mg total) by mouth daily with breakfast., Disp: 90 tablet, Rfl: 3 .  montelukast (SINGULAIR) 10 MG tablet, Take 1 tablet (10 mg total) by mouth at bedtime. For allergies and asthma., Disp: 90 tablet, Rfl: 3 .  ONETOUCH VERIO test strip, USE AS DIRECTED TO CHECK FASTING BLOOD SUGARS THREE TIMES DAILY., Disp: 100 each, Rfl: 3 .  VICTOZA 18 MG/3ML SOPN, Inject 0.3 mLs (1.8 mg total) into the skin daily. For diabetes., Disp: 3 mL, Rfl: 5  Laboratory Chemistry   SAFETY SCREENING Profile No results found. Inflammation Markers (CRP: Acute Phase) (ESR: Chronic Phase) No results found.  Rheumatology Markers No results found.  Renal Function Markers Lab Results  Component Value Date   BUN 13 12/09/2017   CREATININE 0.50 08/11/2018   GFRAA >60 08/06/2017  GFRNONAA >60 08/06/2017                             Hepatic Function Markers Lab Results  Component Value Date   AST 12 12/09/2017   ALT 13 12/09/2017   ALBUMIN 4.2 12/09/2017   ALKPHOS 110 12/09/2017                        Electrolytes Lab Results  Component Value Date   NA 132 (L) 12/09/2017   K 4.4 12/09/2017   CL 95 (L) 12/09/2017   CALCIUM 9.9 12/09/2017                         Neuropathy Markers Lab Results  Component Value Date   HGBA1C 8.2 (A) 07/30/2018                        CNS Tests No results found.  Bone Pathology Markers Lab Results  Component Value Date   VD25OH 26.52 (L) 01/09/2016                         Coagulation Parameters Lab Results  Component Value Date   PLT 230 08/06/2017                        Cardiovascular Markers Lab Results  Component Value Date   HGB 14.4 08/06/2017   HCT 45.8 08/06/2017                         ID Test(s) No results found.  CA Markers No results found.  Endocrine Markers Lab Results  Component Value Date   TSH 0.16 (L) 07/30/2018                        Note: Lab results reviewed.  Recent Diagnostic Imaging Review  Lumbosacral Imaging: Lumbar DG Bending views:  Results for orders placed during the hospital encounter of 03/26/16  DG Lumbar Spine Complete W/Bend   Narrative CLINICAL DATA:  73 y/o F; chronic radicular plane of the left lower extremity in chronic lower back pain worse on the left.  EXAM: LUMBAR SPINE - COMPLETE WITH BENDING VIEWS  COMPARISON:  12/05/2015 lumbar radiographs.  FINDINGS: Five lumbar type non-rib-bearing vertebral bodies are present. Stable grade 1 L5-S1 anterolisthesis. No loss of vertebral body height. Mild L3-4 and moderate L4 through S1 loss of intervertebral disc space height with sclerosis of the L4-5 and L5-S1 articular surfaces. Lower lumbar facet arthrosis. No significant interval change.  IMPRESSION: No acute osseous abnormality. Stable lumbar degenerative changes greatest at the L3 through S1 levels and grade 1 L5-S1 anterolisthesis.   Electronically Signed   By: Kristine Garbe M.D.   On: 03/26/2016 21:52           Complexity Note: Imaging results reviewed. Results shared with Molly Wall, using Layman's terms.                         Assessment  The primary encounter diagnosis was Chronic lower extremity pain (Primary  Area of Pain) (Right). Diagnoses of Chronic lumbar radicular pain (Right), Lumbar herniated disc, Chronic low back pain (Secondary area of Pain) (Bilateral) (R>L), Grade 1 Anterolisthesis of L5/S1, Arthropathy of lumbosacral  facet joint, and Lumbar facet arthropathy were also pertinent to this visit.  Plan of Care  I am having [place]. Wall maintain her fluticasone, albuterol, metformin, atorvastatin, anastrozole, montelukast, B-D UF III MINI PEN NEEDLES, Fluticasone-Salmeterol, OneTouch Delica Plus [id number], Victoza, Basaglar KwikPen, levothyroxine, OneTouch Verio, and [employer]. Pharmacotherapy (Medications Ordered): No orders of the defined types were placed in this encounter.   Procedure Orders     Lumbar Epidural Injection Lab Orders  No laboratory test(s) ordered today   Imaging Orders  No imaging studies ordered today   Referral Orders  No referral(s) requested today   Orders:  Orders Placed This Encounter  Procedures  . Lumbar Epidural Injection    Standing Status:   Future    Standing Expiration Date:   10/17/2018    Scheduling Instructions:     Procedure: Interlaminar Lumbar Epidural Steroid injection (LESI)  L4-5     Laterality: Right-sided     Sedation: With Sedation.     Timeframe: ASAA    Order Specific Question:   Where will this procedure be performed?    Answer:   [facility]   Pharmacological management options:  Opioid Analgesics: We'll take over management today. See above orders Membrane stabilizer: Options discussed, including a trial. Muscle relaxant: We have discussed the possibility of a trial NSAID: Trial discussed. Other analgesic(s): To be determined at a later time    Interventional management options: Planned, scheduled, and/or pending:    Palliative right-sided L4-5 LESI under fluoroscopic guidance and IV sedation    Considering:   Palliative right-sided L4-5 LESI under fluoroscopic guidance and IV sedation    PRN  Procedures:   Palliative right-sided L4-5 LESI under fluoroscopic guidance and IV sedation     Total duration of non-face-to-face encounter: 12 minutes.  Follow-up plan:   Return for Procedure (w/ sedation): (R) L4-5 LESI #1, (ASAP).    Recent Visits No visits were found meeting these conditions.  Showing recent visits within past 90 days and meeting all other requirements   Today's Visits Date Type Provider Dept  09/20/18 Office Visit Milinda Pointer, MD [facility]  Showing today's visits and meeting all other requirements   Future Appointments No visits were found meeting these conditions.  Showing future appointments within next 90 days and meeting all other requirements   Primary Care Physician: [name], NP Location: Telephone Virtual Visit Note by: Gaspar Cola, MD Date: 09/20/2018; Time: 8:25 AM  Note: This dictation was prepared with Dragon dictation. Any transcriptional errors that may result from this process are unintentional.  Disclaimer:  * Given the special circumstances of the COVID-19 pandemic, the federal government has announced that the Office for Civil Rights (OCR) will exercise its enforcement discretion and will not impose penalties on physicians using telehealth in the event of noncompliance with regulatory requirements under the [place] and [place] (HIPAA) in connection with the good faith provision of telehealth during the XTGGY-69 national public health emergency. ([place])

## 2018-09-16 NOTE — Progress Notes (Signed)
Patient is scheduled as new patient however, she is not new.  It has been a couple of years since she was here for procedures as she only comes when she needs pain relief.  States that she is having lower back pain that has come back and and is going down the left leg and into knee and feels that she needs to have another procedure.

## 2018-09-20 ENCOUNTER — Other Ambulatory Visit: Payer: Self-pay

## 2018-09-20 ENCOUNTER — Ambulatory Visit: Payer: BC Managed Care – PPO | Attending: Pain Medicine | Admitting: Pain Medicine

## 2018-09-20 DIAGNOSIS — M79604 Pain in right leg: Secondary | ICD-10-CM | POA: Diagnosis not present

## 2018-09-20 DIAGNOSIS — M5441 Lumbago with sciatica, right side: Secondary | ICD-10-CM | POA: Diagnosis not present

## 2018-09-20 DIAGNOSIS — G8929 Other chronic pain: Secondary | ICD-10-CM

## 2018-09-20 DIAGNOSIS — M5126 Other intervertebral disc displacement, lumbar region: Secondary | ICD-10-CM | POA: Diagnosis not present

## 2018-09-20 DIAGNOSIS — M4310 Spondylolisthesis, site unspecified: Secondary | ICD-10-CM

## 2018-09-20 DIAGNOSIS — M47817 Spondylosis without myelopathy or radiculopathy, lumbosacral region: Secondary | ICD-10-CM

## 2018-09-20 DIAGNOSIS — M5410 Radiculopathy, site unspecified: Secondary | ICD-10-CM

## 2018-09-20 DIAGNOSIS — M47816 Spondylosis without myelopathy or radiculopathy, lumbar region: Secondary | ICD-10-CM

## 2018-09-20 NOTE — Patient Instructions (Signed)

## 2018-09-24 ENCOUNTER — Other Ambulatory Visit: Payer: Self-pay

## 2018-09-24 ENCOUNTER — Other Ambulatory Visit
Admission: RE | Admit: 2018-09-24 | Discharge: 2018-09-24 | Disposition: A | Discharge status: Home / Self Care | Payer: BC Managed Care – PPO | Source: Ambulatory Visit | Attending: Pain Medicine | Admitting: Pain Medicine

## 2018-09-24 DIAGNOSIS — Z1159 Encounter for screening for other viral diseases: Secondary | ICD-10-CM | POA: Insufficient documentation

## 2018-09-24 DIAGNOSIS — Z01812 Encounter for preprocedural laboratory examination: Secondary | ICD-10-CM | POA: Diagnosis not present

## 2018-09-25 LAB — SARS CORONAVIRUS 2 (TAT 6-24 HRS): SARS Coronavirus 2: NEGATIVE

## 2018-09-27 ENCOUNTER — Telehealth: Payer: Self-pay | Admitting: Pain Medicine

## 2018-09-27 DIAGNOSIS — M5136 Other intervertebral disc degeneration, lumbar region: Secondary | ICD-10-CM | POA: Insufficient documentation

## 2018-09-27 DIAGNOSIS — M51369 Other intervertebral disc degeneration, lumbar region without mention of lumbar back pain or lower extremity pain: Secondary | ICD-10-CM | POA: Insufficient documentation

## 2018-09-27 NOTE — Telephone Encounter (Signed)
Error

## 2018-09-27 NOTE — Patient Instructions (Signed)

## 2018-09-27 NOTE — Telephone Encounter (Signed)
Patient lvmail asking to speak with Nurse. She is scheduled for procedure on Tue 09-28-18. Needs to discuss medications and instructions.

## 2018-09-27 NOTE — Progress Notes (Signed)
Patient's Name: Molly Wall  MRN: [id number]  Referring Provider: [name], NP  DOB: 1945/07/27  PCP: [name], NP  DOS: 09/28/2018  Note by: Gaspar Cola, MD  Service setting: Ambulatory outpatient  Specialty: Interventional Pain Management  Patient type: Established  Location: [facility] (AMB) Pain Management Facility  Visit type: Interventional Procedure   Primary Reason for Visit: Interventional Pain Management Treatment. CC: Back Pain (lower)  Procedure:          Anesthesia, Analgesia, Anxiolysis:  Type: Therapeutic Inter-Laminar Epidural Steroid Injection  #3  Region: Lumbar Level: L4-5 Level. Laterality: Right-Sided Paramedial  Type: Moderate (Conscious) Sedation combined with Local Anesthesia Indication(s): Analgesia and Anxiety Route: Intravenous (IV) IV Access: Secured Sedation: Meaningful verbal contact was maintained at all times during the procedure  Local Anesthetic: Lidocaine 1-2%  Position: Prone with head of the table was raised to facilitate breathing.   Indications: 1. DDD (degenerative disc disease), lumbar   2. Chronic lower extremity pain (Primary Area of Pain) (Right)   3. Grade 1 Anterolisthesis of L5/S1    Pain Score: Pre-procedure: 3 /10 Post-procedure: 1 /10  Pertinent Labs  COVID-19 screennig: Lab Results  Component Value Date   SARSCOV2NAA NEGATIVE 09/24/2018   Pre-op Assessment:  Molly Wall is a 73 y.o. (year old), female patient, seen today for interventional treatment. She  has a past surgical history that includes Shoulder surgery (1976); Knee surgery (2014); Appendectomy; Ganglion cyst excision; Incision and drainage abscess anal; Colonoscopy; and Breast lumpectomy with radioactive seed localization (Right, 08/12/2017). Molly Wall has a current medication list which includes the following prescription(s): albuterol, anastrozole, atorvastatin, b-d uf iii mini pen needles, fluticasone, fluticasone-salmeterol, basaglar  kwikpen, basaglar kwikpen, onetouch delica plus [id number], levothyroxine, metformin, montelukast, onetouch verio, and victoza, and the following Facility-Administered Medications: fentanyl and midazolam. Her primarily concern today is the Back Pain (lower)  Initial Vital Signs:  Pulse/HCG Rate: 69ECG Heart Rate: 78 Temp: 98.1 F (36.7 C) Resp: 16 BP: 131/71 SpO2: 98 %  BMI: Estimated body mass index is 30.95 kg/m as calculated from the following:   Height as of this encounter: 5\' 5"  (1.651 m).   Weight as of this encounter: 186 lb (84.4 kg).  Risk Assessment: Allergies: Reviewed. She is allergic to iodine-131.  Allergy Precautions: None required Coagulopathies: Reviewed. None identified.  Blood-thinner therapy: None at this time Active Infection(s): Reviewed. None identified. Molly Wall is afebrile  Site Confirmation: Molly Wall was asked to confirm the procedure and laterality before marking the site Procedure checklist: Completed Consent: Before the procedure and under the influence of no sedative(s), amnesic(s), or anxiolytics, the patient was informed of the treatment options, risks and possible complications. To fulfill our ethical and legal obligations, as recommended by the American Medical Association's Code of Ethics, I have informed the patient of my clinical impression; the nature and purpose of the treatment or procedure; the risks, benefits, and possible complications of the intervention; the alternatives, including doing nothing; the risk(s) and benefit(s) of the alternative treatment(s) or procedure(s); and the risk(s) and benefit(s) of doing nothing. The patient was provided information about the general risks and possible complications associated with the procedure. These may include, but are not limited to: failure to achieve desired goals, infection, bleeding, organ or nerve damage, allergic reactions, paralysis, and death. In addition, the patient was informed of those  risks and complications associated to Spine-related procedures, such as failure to decrease pain; infection (i.e.: Meningitis, epidural or intraspinal abscess); bleeding (i.e.:  epidural hematoma, subarachnoid hemorrhage, or any other type of intraspinal or peri-dural bleeding); organ or nerve damage (i.e.: Any type of peripheral nerve, nerve root, or spinal cord injury) with subsequent damage to sensory, motor, and/or autonomic systems, resulting in permanent pain, numbness, and/or weakness of one or several areas of the body; allergic reactions; (i.e.: anaphylactic reaction); and/or death. Furthermore, the patient was informed of those risks and complications associated with the medications. These include, but are not limited to: allergic reactions (i.e.: anaphylactic or anaphylactoid reaction(s)); adrenal axis suppression; blood sugar elevation that in diabetics may result in ketoacidosis or comma; water retention that in patients with history of congestive heart failure may result in shortness of breath, pulmonary edema, and decompensation with resultant heart failure; weight gain; swelling or edema; medication-induced neural toxicity; particulate matter embolism and blood vessel occlusion with resultant organ, and/or nervous system infarction; and/or aseptic necrosis of one or more joints. Finally, the patient was informed that Medicine is not an exact science; therefore, there is also the possibility of unforeseen or unpredictable risks and/or possible complications that may result in a catastrophic outcome. The patient indicated having understood very clearly. We have given the patient no guarantees and we have made no promises. Enough time was given to the patient to ask questions, all of which were answered to the patient's satisfaction. Molly Wall has indicated that she wanted to continue with the procedure. Attestation: I, the ordering provider, attest that I have discussed with the patient the  benefits, risks, side-effects, alternatives, likelihood of achieving goals, and potential problems during recovery for the procedure that I have provided informed consent. Date  Time: 09/28/2018  8:02 AM  Pre-Procedure Preparation:  Monitoring: As per clinic protocol. Respiration, ETCO2, SpO2, BP, heart rate and rhythm monitor placed and checked for adequate function Safety Precautions: Patient was assessed for positional comfort and pressure points before starting the procedure. Time-out: I initiated and conducted the "Time-out" before starting the procedure, as per protocol. The patient was asked to participate by confirming the accuracy of the "Time Out" information. Verification of the correct person, site, and procedure were performed and confirmed by me, the nursing staff, and the patient. "Time-out" conducted as per Joint Commission's Universal Protocol (UP.01.01.01). Time: 2353  Description of Procedure:          Target Area: The interlaminar space, initially targeting the lower laminar border of the superior vertebral body. Approach: Paramedial approach. Area Prepped: Entire Posterior Lumbar Region Prepping solution: DuraPrep (Iodine Povacrylex [0.7% available iodine] and Isopropyl Alcohol, 74% w/w) Safety Precautions: Aspiration looking for blood return was conducted prior to all injections. At no point did we inject any substances, as a needle was being advanced. No attempts were made at seeking any paresthesias. Safe injection practices and needle disposal techniques used. Medications properly checked for expiration dates. SDV (single dose vial) medications used. Description of the Procedure: Protocol guidelines were followed. The procedure needle was introduced through the skin, ipsilateral to the reported pain, and advanced to the target area. Bone was contacted and the needle walked caudad, until the lamina was cleared. The epidural space was identified using "loss-of-resistance  technique" with 2-3 ml of PF-NaCl (0.9% NSS), in a 5cc LOR glass syringe.  Vitals:   09/28/18 0900 09/28/18 0910 09/28/18 0920 09/28/18 0930  BP: 132/75 (!) 115/58 108/61 115/62  Pulse:      Resp: 12 10 10 18   Temp:      SpO2: 100% 99% 98% 100%  Weight:  Height:        Start Time: 0855 hrs. End Time: 0900 hrs.  Materials:  Needle(s) Type: Epidural needle Gauge: 17G Length: 3.5-in Medication(s): Please see orders for medications and dosing details.  Imaging Guidance (Spinal):          Type of Imaging Technique: Fluoroscopy Guidance (Spinal) Indication(s): Assistance in needle guidance and placement for procedures requiring needle placement in or near specific anatomical locations not easily accessible without such assistance. Exposure Time: Please see nurses notes. Contrast: Before injecting any contrast, we confirmed that the patient did not have an allergy to iodine, shellfish, or radiological contrast. Once satisfactory needle placement was completed at the desired level, radiological contrast was injected. Contrast injected under live fluoroscopy. No contrast complications. See chart for type and volume of contrast used. Fluoroscopic Guidance: I was personally present during the use of fluoroscopy. "Tunnel Vision Technique" used to obtain the best possible view of the target area. Parallax error corrected before commencing the procedure. "Direction-depth-direction" technique used to introduce the needle under continuous pulsed fluoroscopy. Once target was reached, antero-posterior, oblique, and lateral fluoroscopic projection used confirm needle placement in all planes. Images permanently stored in EMR. Interpretation: I personally interpreted the imaging intraoperatively. Adequate needle placement confirmed in multiple planes. Appropriate spread of contrast into desired area was observed. No evidence of afferent or efferent intravascular uptake. No intrathecal or subarachnoid  spread observed. Permanent images saved into the patient's record.  Antibiotic Prophylaxis:   Anti-infectives (From admission, onward)   None     Indication(s): None identified  Post-operative Assessment:  Post-procedure Vital Signs:  Pulse/HCG Rate: 7876 Temp: 98.1 F (36.7 C) Resp: 18 BP: 115/62 SpO2: 100 %  EBL: None  Complications: No immediate post-treatment complications observed by team, or reported by patient.  Note: The patient tolerated the entire procedure well. A repeat set of vitals were taken after the procedure and the patient was kept under observation following institutional policy, for this type of procedure. Post-procedural neurological assessment was performed, showing return to baseline, prior to discharge. The patient was provided with post-procedure discharge instructions, including a section on how to identify potential problems. Should any problems arise concerning this procedure, the patient was given instructions to immediately contact us, at any time, without hesitation. In any case, we plan to contact the patient by telephone for a follow-up status report regarding this interventional procedure.  Comments:  No additional relevant information.  Plan of Care  Orders:  Orders Placed This Encounter  Procedures  . Lumbar Epidural Injection    Scheduling Instructions:     Procedure: Interlaminar LESI L4-5     Laterality: Right-sided     Sedation: With Sedation     Timeframe:  Today    Order Specific Question:   Where will this procedure be performed?    Answer:   [facility]  . DG PAIN [facility] C-ARM 1-60 MIN NO REPORT    Intraoperative interpretation by procedural physician at [place].    Standing Status:   Standing    Number of Occurrences:   1    Order Specific Question:   Reason for exam:    Answer:   Assistance in needle guidance and placement for procedures requiring needle placement in or near specific anatomical  locations not easily accessible without such assistance.  . Provider attestation of informed consent for procedure/surgical case    I, the ordering provider, attest that I have discussed with the patient the benefits, risks, side  effects, alternatives, likelihood of achieving goals and potential problems during recovery for the procedure that I have provided informed consent.    Standing Status:   Standing    Number of Occurrences:   1  . Informed Consent Details: Transcribe to consent form and obtain patient signature    Standing Status:   Standing    Number of Occurrences:   1    Order Specific Question:   Procedure    Answer:   Lumbar epidural steroid injection under fluoroscopic guidance. (See notes for level and laterality.)    Order Specific Question:   Surgeon    Answer:   Raelie Lohr A. 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 Miscellanous precautions    NOTE: Although It is true that patients can have allergies to shellfish and that shellfish contain iodine, most shellfish  allergies are due to two protein allergens present in the shellfish: tropomyosins and parvalbumin. Not all patients with shellfish allergies are allergic to iodine. However, as a precaution, avoid using iodine containing products.    Standing Status:   Standing    Number of Occurrences:   1   Chronic Opioid Analgesic:   None from our practice.    Medications ordered for procedure: Meds ordered this encounter  Medications  . lidocaine (XYLOCAINE) 2 % (with pres) injection 400 mg  . lactated ringers infusion 1,000 mL  . midazolam (VERSED) 5 MG/5ML injection 1-2 mg    Make sure Flumazenil is available in the pyxis when using this medication. If oversedation occurs, administer 0.2 mg IV over 15 sec. If after 45 sec no response, administer 0.2 mg again over 1 min; may repeat at 1 min intervals; not to exceed  4 doses (1 mg)  . fentaNYL (SUBLIMAZE) injection 25-50 mcg    Make sure Narcan is available in the pyxis when using this medication. In the event of respiratory depression (RR< 8/min): Titrate NARCAN (naloxone) in increments of 0.1 to 0.2 mg IV at 2-3 minute intervals, until desired degree of reversal.  . sodium chloride flush (NS) 0.9 % injection 2 mL  . ropivacaine (PF) 2 mg/mL (0.2%) (NAROPIN) injection 2 mL  . triamcinolone acetonide (KENALOG-40) injection 40 mg   Medications administered: We administered lidocaine, lactated ringers, midazolam, fentaNYL, sodium chloride flush, ropivacaine (PF) 2 mg/mL (0.2%), and triamcinolone acetonide.  See the medical record for exact dosing, route, and time of administration.  Follow-up plan:   Return for (VV), 2 wk PP-F/U Eval.       Interventional management options: Planned, scheduled, and/or pending:    None at this time.   Considering:   NOTE: IODINE Allergy. Patient initially seen as a "Fast Track"  Palliative right-sided L4-5 LESIs    PRN Procedures:   Palliative right-sided L4-5 LESI #4      Recent Visits Date Type Provider Dept  09/20/18 Office Visit Milinda Pointer, MD [facility]  Showing recent visits within past 90 days and meeting all other requirements   Today's Visits Date Type Provider Dept  09/28/18 Procedure visit Milinda Pointer, MD [facility]  Showing today's visits and meeting all other requirements   Future Appointments Date Type Provider Dept  11/01/18 Appointment Milinda Pointer, MD [facility]  Showing future appointments within next 90 days and meeting all other requirements   Disposition: Discharge home  Discharge Date & Time: 09/28/2018;  0930 hrs.   Primary Care Physician: [name], NP Location: [facility] Note by: Gaspar Cola, MD Date: 09/28/2018; Time: 9:38 AM  Disclaimer:  Medicine is not an Chief Strategy Officer. The  only guarantee in medicine is that nothing is guaranteed. It is important to note that the decision to proceed with this intervention was based on the information collected from the patient. The Data and conclusions were drawn from the patient's questionnaire, the interview, and the physical examination. Because the information was provided in large part by the patient, it cannot be guaranteed that it has not been purposely or unconsciously manipulated. Every effort has been made to obtain as much relevant data as possible for this evaluation. It is important to note that the conclusions that lead to this procedure are derived in large part from the available data. Always take into account that the treatment will also be dependent on availability of resources and existing treatment guidelines, considered by other Pain Management Practitioners as being common knowledge and practice, at the time of the intervention. For Medico-Legal purposes, it is also important to point out that variation in procedural techniques and pharmacological choices are the acceptable norm. The indications, contraindications, technique, and results of the above procedure should only be interpreted and judged by a Board-Certified Interventional Pain Specialist with extensive familiarity and expertise in the same exact procedure and technique.

## 2018-09-27 NOTE — Telephone Encounter (Signed)
Pre procedure instructions given. Patient verbalized understanding

## 2018-09-28 ENCOUNTER — Other Ambulatory Visit: Payer: Self-pay

## 2018-09-28 ENCOUNTER — Ambulatory Visit (HOSPITAL_BASED_OUTPATIENT_CLINIC_OR_DEPARTMENT_OTHER): Payer: BC Managed Care – PPO | Admitting: Pain Medicine

## 2018-09-28 ENCOUNTER — Encounter: Payer: Self-pay | Admitting: Pain Medicine

## 2018-09-28 ENCOUNTER — Ambulatory Visit
Admission: RE | Admit: 2018-09-28 | Discharge: 2018-09-28 | Disposition: A | Discharge status: Home / Self Care | Payer: BC Managed Care – PPO | Source: Ambulatory Visit | Attending: Pain Medicine | Admitting: Pain Medicine

## 2018-09-28 VITALS — BP 115/62 | HR 78 | Temp 98.1°F | Resp 18 | Ht 65.0 in | Wt 186.0 lb

## 2018-09-28 DIAGNOSIS — M5136 Other intervertebral disc degeneration, lumbar region: Secondary | ICD-10-CM

## 2018-09-28 DIAGNOSIS — Z888 Allergy status to other drugs, medicaments and biological substances status: Secondary | ICD-10-CM | POA: Insufficient documentation

## 2018-09-28 DIAGNOSIS — M4310 Spondylolisthesis, site unspecified: Secondary | ICD-10-CM | POA: Insufficient documentation

## 2018-09-28 DIAGNOSIS — M79604 Pain in right leg: Secondary | ICD-10-CM | POA: Diagnosis not present

## 2018-09-28 DIAGNOSIS — G8929 Other chronic pain: Secondary | ICD-10-CM | POA: Diagnosis not present

## 2018-09-28 MED ORDER — ROPIVACAINE HCL 2 MG/ML IJ SOLN
2.0000 mL | Freq: Once | INTRAMUSCULAR | Status: AC
  Administered 2018-09-28: 2 mL via EPIDURAL

## 2018-09-28 MED ORDER — SODIUM CHLORIDE (PF) 0.9 % IJ SOLN
INTRAMUSCULAR | Status: AC
  Filled 2018-09-28: qty 10

## 2018-09-28 MED ORDER — LACTATED RINGERS IV SOLN
1000.0000 mL | Freq: Once | INTRAVENOUS | Status: AC
  Administered 2018-09-28: 1000 mL via INTRAVENOUS

## 2018-09-28 MED ORDER — SODIUM CHLORIDE 0.9% FLUSH
2.0000 mL | Freq: Once | INTRAVENOUS | Status: AC
  Administered 2018-09-28: 2 mL

## 2018-09-28 MED ORDER — FENTANYL CITRATE (PF) 100 MCG/2ML IJ SOLN
INTRAMUSCULAR | Status: AC
  Filled 2018-09-28: qty 2

## 2018-09-28 MED ORDER — LIDOCAINE HCL 2 % IJ SOLN
20.0000 mL | Freq: Once | INTRAMUSCULAR | Status: AC
  Administered 2018-09-28: 400 mg

## 2018-09-28 MED ORDER — ROPIVACAINE HCL 2 MG/ML IJ SOLN
INTRAMUSCULAR | Status: AC
  Filled 2018-09-28: qty 10

## 2018-09-28 MED ORDER — TRIAMCINOLONE ACETONIDE 40 MG/ML IJ SUSP
INTRAMUSCULAR | Status: AC
  Filled 2018-09-28: qty 1

## 2018-09-28 MED ORDER — TRIAMCINOLONE ACETONIDE 40 MG/ML IJ SUSP
40.0000 mg | Freq: Once | INTRAMUSCULAR | Status: AC
  Administered 2018-09-28: 40 mg

## 2018-09-28 MED ORDER — LIDOCAINE HCL 2 % IJ SOLN
INTRAMUSCULAR | Status: AC
  Filled 2018-09-28: qty 20

## 2018-09-28 MED ORDER — MIDAZOLAM HCL 5 MG/5ML IJ SOLN
INTRAMUSCULAR | Status: AC
  Filled 2018-09-28: qty 5

## 2018-09-28 MED ORDER — MIDAZOLAM HCL 5 MG/5ML IJ SOLN
1.0000 mg | INTRAMUSCULAR | Status: DC | PRN
  Administered 2018-09-28: 1 mg via INTRAVENOUS

## 2018-09-28 MED ORDER — FENTANYL CITRATE (PF) 100 MCG/2ML IJ SOLN
25.0000 ug | INTRAMUSCULAR | Status: DC | PRN
  Administered 2018-09-28: 50 ug via INTRAVENOUS

## 2018-09-28 NOTE — Progress Notes (Signed)
Safety precautions to be maintained throughout the outpatient stay will include: orient to surroundings, keep bed in low position, maintain call bell within reach at all times, provide assistance with transfer out of bed and ambulation.  

## 2018-09-29 ENCOUNTER — Encounter: Payer: Self-pay | Admitting: Primary Care

## 2018-09-29 ENCOUNTER — Telehealth: Payer: Self-pay

## 2018-09-29 ENCOUNTER — Ambulatory Visit (INDEPENDENT_AMBULATORY_CARE_PROVIDER_SITE_OTHER): Payer: BC Managed Care – PPO | Admitting: Primary Care

## 2018-09-29 ENCOUNTER — Ambulatory Visit: Payer: BC Managed Care – PPO

## 2018-09-29 ENCOUNTER — Other Ambulatory Visit: Payer: Self-pay | Admitting: Primary Care

## 2018-09-29 ENCOUNTER — Telehealth: Payer: Self-pay | Admitting: *Deleted

## 2018-09-29 VITALS — Wt 186.0 lb

## 2018-09-29 DIAGNOSIS — E039 Hypothyroidism, unspecified: Secondary | ICD-10-CM

## 2018-09-29 DIAGNOSIS — J4521 Mild intermittent asthma with (acute) exacerbation: Secondary | ICD-10-CM

## 2018-09-29 MED ORDER — PREDNISONE 20 MG PO TABS: ORAL_TABLET | ORAL | 0 refills | Status: DC

## 2018-09-29 NOTE — Telephone Encounter (Signed)
Patient was evaluated and treated for acute asthma exacerbation.  She was stable during examination. She will update within 2 to 3 days after treatment.  Return/ED precautions provided.

## 2018-09-29 NOTE — Progress Notes (Signed)
Subjective:    Patient ID: Molly Wall, female    DOB: November 22, 1945, 73 y.o.   MRN: [id number]  HPI  Virtual Visit via Video Note  I connected with Molly Wall on 09/29/18 at 11:00 AM EDT by a video enabled telemedicine application and verified that I am speaking with the correct person using two identifiers.  Location: Patient: Home Provider: Office   I discussed the limitations of evaluation and management by telemedicine and the availability of in person appointments. The patient expressed understanding and agreed to proceed.  History of Present Illness:  Molly Wall is a 73 year old female with a history of type 2 diabetes, breast cancer, asthma, tobacco abuse (former) who presents today with a chief complaint of shortness of breath.   Over the last 10 days she's noticed some difficulty with "getting in a good breath", chest tightness, shallow breathing, post nasal drip, hoarse voice, mild cough.   She was tested for Covid-19 five days ago which was negative. She is compliant to her Advair daily for which she resumed last week, she's been using her albuterol inhaler as needed with temporary improvement.   She denies exposure to Covid-19, fevers, fatigue, respiratory distress.   Observations/Objective:  Alert and oriented. Appears well, not sickly. No distress. Speaking in complete sentences. No cough.  Assessment and Plan:  Symptoms representative of acute asthma flare, especially given temporary improvement with SABA treatment. She is not in distress, speaking in complete sentences.  Discussed to continue Advair daily, use albuterol PRN. Rx for prednisone burst sent to pharmacy. Discussed to watch glucose readings.   Follow Up Instructions:  Continue taking Advair inhaler daily.  Use the albuterol inhaler every 4-6 hours as needed for shortness of breath.  Start prednisone 20 mg tablets for the asthma attack. Take 2 tablets daily for 5 days. Watch your  blood sugars with the prednisone.  Please update me if no improvement in 2-3 days.  It was a pleasure to see you today!    I discussed the assessment and treatment plan with the patient. The patient was provided an opportunity to ask questions and all were answered. The patient agreed with the plan and demonstrated an understanding of the instructions.   The patient was advised to call back or seek an in-person evaluation if the symptoms worsen or if the condition fails to improve as anticipated.     [name], NP    Review of Systems  Constitutional: Negative for chills and fever.  HENT: Positive for congestion and postnasal drip. Negative for sore throat.   Respiratory: Positive for cough and shortness of breath.        Chest tightness  Cardiovascular: Negative for chest pain.  Allergic/Immunologic: Positive for environmental allergies.       Past Medical History:  Diagnosis Date  . Allergic rhinitis   . Allergy   . Asthma   . Atypical ductal hyperplasia of right breast   . 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 Breast cancer ([place])   . Diabetes mellitus without complication ([place])   . Family history of breast cancer   . Family history of colon cancer   . Hypercholesteremia   . Hypothyroidism   . Lumbar herniated disc   . OSA (obstructive sleep apnea)    used to use CPAP, not any more over 1 1/2 yrs  . Sleep apnea    no cpap in 2 yrs   . Vitamin D deficiency  Social History   Socioeconomic History  . Marital status: Divorced    Spouse name: Not on file  . Number of children: 2  . Years of education: Not on file  . Highest education level: Not on file  Occupational History  . Not on file  Social Needs  . Financial resource strain: Not on file  . Food insecurity    Worry: Not on file    Inability: Not on file  . Transportation needs    Medical: Not on file    Non-medical: Not on file  Tobacco Use  . Smoking status: Former  Smoker    Types: Cigarettes  . Smokeless tobacco: Never Used  Substance and Sexual Activity  . Alcohol use: Yes    Alcohol/week: 0.0 - 1.0 standard drinks    Comment: 0-1 per month  . Drug use: No  . Sexual activity: Not Currently  Lifestyle  . Physical activity    Days per week: Not on file    Minutes per session: Not on file  . Stress: Not on file  Relationships  . Social Herbalist on phone: Not on file    Gets together: Not on file    Attends religious service: Not on file    Active member of club or organization: Not on file    Attends meetings of clubs or organizations: Not on file    Relationship status: Not on file  . Intimate partner violence    Fear of current or ex partner: Not on file    Emotionally abused: Not on file    Physically abused: Not on file    Forced sexual activity: Not on file  Other Topics Concern  . Not on file  Social History Narrative   Single.   Moved from [place] to [place] several weeks ago.   Family lives in [place].   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    Past Surgical History:  Procedure Laterality Date  . APPENDECTOMY    . BREAST LUMPECTOMY WITH RADIOACTIVE SEED LOCALIZATION Right 08/12/2017   Procedure: RIGHT BREAST LUMPECTOMY X'S 2 WITH RADIOACTIVE SEED LOCALIZATION X'S 2;  Surgeon: Fanny Skates, MD;  Location: [facility];  Service: General;  Laterality: Right;  . COLONOSCOPY     11 yrs ago in [place] but given a 10 yr recall per pt   . GANGLION CYST EXCISION     73 years old  . INCISION AND DRAINAGE ABSCESS ANAL    . KNEE SURGERY  2014  . SHOULDER SURGERY  1976    Family History  Problem Relation Age of Onset  . Alzheimer's disease Mother   . Asthma Mother   . Dementia Mother        cause of death  . Arthritis Father   . Heart attack Father 76       cause of death  . Breast cancer Sister 60   . Colon cancer Paternal Grandmother   . Liver cancer Maternal Uncle   . Colon polyps Neg Hx   . Rectal cancer Neg Hx   . Stomach cancer Neg Hx     Allergies  Allergen Reactions  . 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 She described it was a small amount in a milk carton in [place]. She states they stopped  using it shortly after because the trial wasn't successful. SPM    Current Outpatient Medications on File Prior to Visit  Medication Sig Dispense Refill  . albuterol (PROAIR HFA) 108 (90 Base) MCG/ACT inhaler Inhale 2 puffs into the lungs every 6 (six) hours as needed for wheezing or shortness of breath. NEED APPOINTMENT FOR ANY MORE REFILLS 18 g 0  . anastrozole (ARIMIDEX) 1 MG tablet Take 1 tablet (1 mg total) by mouth daily. 90 tablet 4  . atorvastatin (LIPITOR) 20 MG tablet Take 1 tablet by mouth daily for cholesterol. 90 tablet 3  . B-D UF III MINI PEN NEEDLES 31G X 5 MM MISC USE AS DIRECTED USE DAILY TO ADMINISTER VICTOZA 100 each 5  . fluticasone (FLONASE) 50 MCG/ACT nasal spray Place 2 sprays into both nostrils daily. 48 g 1  . Fluticasone-Salmeterol (ADVAIR) 250-50 MCG/DOSE AEPB Inhale 1 puff into the lungs. 1 puff daily as needed    . Insulin Glargine (BASAGLAR KWIKPEN) 100 UNIT/ML SOPN Inject 0.45 mLs (45 Units total) into the skin every morning. 15 mL 5  . Insulin Glargine (BASAGLAR KWIKPEN) 100 UNIT/ML SOPN Inject 45 Units into the skin daily.    . Lancets (ONETOUCH DELICA PLUS EBXIDH68S) MISC Inject 1 Device into the skin 3 (three) times daily. To check blood sugar 100 each 2  . levothyroxine (SYNTHROID) 75 MCG tablet Take 1 tablet by mouth every morning on an empty stomach. No food or other medications for 30 minutes. 30 tablet 1  . metformin (FORTAMET) 1000 MG (OSM) 24 hr tablet Take 1 tablet (1,000 mg total) by mouth daily with breakfast. 90 tablet 3  . montelukast (SINGULAIR) 10 MG tablet  Take 1 tablet (10 mg total) by mouth at bedtime. For allergies and asthma. 90 tablet 3  . ONETOUCH VERIO test strip USE AS DIRECTED TO CHECK FASTING BLOOD SUGARS THREE TIMES DAILY. 100 each 3  . VICTOZA 18 MG/3ML SOPN Inject 0.3 mLs (1.8 mg total) into the skin daily. For diabetes. 3 mL 5   No current facility-administered medications on file prior to visit.     Wt 186 lb (84.4 kg)   BMI 30.95 kg/m    Objective:   Physical Exam  Constitutional: She is oriented to person, place, and time. She appears well-nourished.  Respiratory: Effort normal. No respiratory distress.  No cough or audible wheezing on exam  Neurological: She is alert and oriented to person, place, and time.  Psychiatric: She has a normal mood and affect.           Assessment & Plan:

## 2018-09-29 NOTE — Assessment & Plan Note (Signed)
Symptoms representative of acute asthma flare, especially given temporary improvement with SABA treatment. She is not in distress, speaking in complete sentences.  Discussed to continue Advair daily, use albuterol PRN. Rx for prednisone burst sent to pharmacy. Discussed to watch glucose readings.

## 2018-09-29 NOTE — Telephone Encounter (Addendum)
Pt said for 1 wk does not feel like getting good deep breaths and does not feel like getting enough oxygen. On 09/24/18 tested for covid with neg result on 09/27/18 prior to a surgical procedure. Pt said she uses rescue inhaler and that helps. Pt is also feeling more tired than usual and sleeping a lot during the day which is unusual for pt..pt thinks humidity and air quality is not that good and wants to know if something can be done with medication. Prod cough with clear phlegm.when pt goes to sleep and wakes up phlegm is thick and yellow. Pt has hoarseness. No CP.  No fever,chills,S/T,,muscle pain,diarrhea, h/a and no loss of taste or smell; no travel and no exposure to covid. Pt said this morning on her way to [employer] which is where pt is at now she was panting or gasping for breath. Pt used inhaler and has been ok since then. Pt said she does not feel like she needs to go to [facility] or ED now. Pt does not hear any wheezing or rattling in her chest. Pt has virtual appt with Gentry Fitz NP today at [place] said just to leave appt as virtual. I advised pt if has another episode where she is gasping for air or cannot get breath and pt is driving to pull over and call 911. Pt voiced understanding.

## 2018-09-29 NOTE — Telephone Encounter (Signed)
No problems post procedure. 

## 2018-09-29 NOTE — Patient Instructions (Signed)
Continue taking Advair inhaler daily.  Use the albuterol inhaler every 4-6 hours as needed for shortness of breath.  Start prednisone 20 mg tablets for the asthma attack. Take 2 tablets daily for 5 days. Watch your blood sugars with the prednisone.  Please update me if no improvement in 2-3 days.  It was a pleasure to see you today!

## 2018-09-30 ENCOUNTER — Telehealth: Payer: Self-pay

## 2018-09-30 NOTE — Telephone Encounter (Signed)
The prednisone will cause hyperglycemia, also the steroid injection (for which I was not made privy to during our discussion yesterday). If she's still struggling with SOB then I recommend she resume prednisone but at half the dose, 1 tablet daily x 5 days.  Have her drink plenty of water today to get those blood sugars down.  If glucose readings continue to spike above 300-400 then have her update me.  Okay to proceed with her procedure but I would touch base with the surgery center at [facility] first.

## 2018-09-30 NOTE — Telephone Encounter (Signed)
Spoken and notified patient of Kate Clark's comments. Patient verbalized understanding.  

## 2018-09-30 NOTE — Telephone Encounter (Signed)
Pt left v/m; pt was started on Prednisone on 09/29/18 and few days earlier had taken shot in spine that had cortisone in it. Last night BS was 578 and today FBS was 305. Pt is scheduled for medical procedure at [facility] today to remove stone in saliva gland. Pt thinks will be done locally. Pt has to leave for appt at 11:30 AM if Molly Fitz NP thinks pt should keep appt or should pt cancel appt since BS so high.pt also wants to know if she should take the prednisone today. Pt request cb ASAP. FYI to Molly Bossier NP and note taken to [facility] CMA to see what to do about appt and taking prednisone today.Please advise.

## 2018-09-30 NOTE — Telephone Encounter (Signed)
Spoken and notified patient of Molly Wall comments. Patient verbalized understanding.  Dr [place] Callas at [phone]

## 2018-09-30 NOTE — Telephone Encounter (Signed)
She needs to touch base with the surgical center. Have her monitor her glucose readings.

## 2018-10-06 DIAGNOSIS — Z803 Family history of malignant neoplasm of breast: Secondary | ICD-10-CM | POA: Diagnosis not present

## 2018-10-06 DIAGNOSIS — N6091 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: Secondary | ICD-10-CM | POA: Diagnosis not present

## 2018-10-06 DIAGNOSIS — E119 Type 2 diabetes mellitus without complications: Secondary | ICD-10-CM | POA: Diagnosis not present

## 2018-10-08 ENCOUNTER — Telehealth: Payer: Self-pay | Admitting: Primary Care

## 2018-10-08 DIAGNOSIS — J4520 Mild intermittent asthma, uncomplicated: Secondary | ICD-10-CM

## 2018-10-08 MED ORDER — FLUTICASONE-SALMETEROL 250-50 MCG/DOSE IN AEPB: 1.0000 | INHALATION_SPRAY | Freq: Two times a day (BID) | RESPIRATORY_TRACT | 1 refills | Status: DC

## 2018-10-08 NOTE — Telephone Encounter (Signed)
Pt left /vm wanting to ck on status of advair refill; pt said she is is in mild breathing distress. Breathing is not bad yet.

## 2018-10-08 NOTE — Telephone Encounter (Signed)
Patient was seen on 09/29/18.  Patient was told to call back, if she wasn't feeling well.  Patient said she was taking Advair and it wasn't helping. The Advair was expired x 4 years.  Patient would like a rx for Advair called for in to [facility].  Patient is wondering if she should start Prednisone because she's having some difficulty breathing. She's hoping the Advair will help. Patient wants to know what to do before the weekend.

## 2018-10-08 NOTE — Telephone Encounter (Signed)
Noted. Recommend we continue with albuterol PRN, start with Advair now. Please have her update [place] Monday (10/11/18).

## 2018-10-08 NOTE — Telephone Encounter (Signed)
Please notify patient that I sent in the Advair inhaler now, 1 puff BID. This is not a rescue inhaler so it will take a few days to improve her symptoms. She can use her albuterol inhaler every 4 hours as needed. Is she using her albuterol inhaler? Is it helping?

## 2018-10-08 NOTE — Telephone Encounter (Signed)
Spoken and notified patient of Molly Millers comments. Patient stated she does have a albuterol inhaler and yes, it helps.

## 2018-10-11 NOTE — Telephone Encounter (Signed)
She will need an office visit for further evaluation of her acute symptoms. We can also discuss her blood sugar readings. Please schedule.

## 2018-10-11 NOTE — Telephone Encounter (Signed)
Spoken and notified patient of Molly Wall comments. Patient has been scheduled for OV on 10/13/2018

## 2018-10-11 NOTE — Telephone Encounter (Signed)
Spoken and notified patient of Molly Wall comments earlier this morning. Patient  Stated that blood sugar is slowing coming down. She noticed that at night it is running in the 200s-300s and in the day in 200s. Patient stated she wanted Anda Kraft to that last night she had a right sharp pain in her back, she is not sure from what, no urinary symptoms but thinks something wrong with her kidney or liver. I suggested an appointment but she decline and wanted me ask Anda Kraft first.

## 2018-10-13 ENCOUNTER — Encounter: Payer: Self-pay | Admitting: Primary Care

## 2018-10-13 ENCOUNTER — Other Ambulatory Visit: Payer: Self-pay

## 2018-10-13 ENCOUNTER — Ambulatory Visit (INDEPENDENT_AMBULATORY_CARE_PROVIDER_SITE_OTHER)
Admission: RE | Admit: 2018-10-13 | Discharge: 2018-10-13 | Disposition: A | Discharge status: Home / Self Care | Payer: BC Managed Care – PPO | Source: Ambulatory Visit | Attending: Primary Care | Admitting: Primary Care

## 2018-10-13 ENCOUNTER — Other Ambulatory Visit: Payer: Self-pay | Admitting: Primary Care

## 2018-10-13 ENCOUNTER — Ambulatory Visit (INDEPENDENT_AMBULATORY_CARE_PROVIDER_SITE_OTHER): Payer: BC Managed Care – PPO | Admitting: Primary Care

## 2018-10-13 VITALS — BP 116/60 | HR 80 | Temp 98.2°F | Ht 65.0 in | Wt 186.2 lb

## 2018-10-13 DIAGNOSIS — E119 Type 2 diabetes mellitus without complications: Secondary | ICD-10-CM

## 2018-10-13 DIAGNOSIS — R0602 Shortness of breath: Secondary | ICD-10-CM

## 2018-10-13 DIAGNOSIS — J4520 Mild intermittent asthma, uncomplicated: Secondary | ICD-10-CM | POA: Diagnosis not present

## 2018-10-13 DIAGNOSIS — R109 Unspecified abdominal pain: Secondary | ICD-10-CM | POA: Insufficient documentation

## 2018-10-13 DIAGNOSIS — J4521 Mild intermittent asthma with (acute) exacerbation: Secondary | ICD-10-CM

## 2018-10-13 DIAGNOSIS — Z794 Long term (current) use of insulin: Secondary | ICD-10-CM

## 2018-10-13 DIAGNOSIS — R10A0 Flank pain, unspecified side: Secondary | ICD-10-CM

## 2018-10-13 DIAGNOSIS — E039 Hypothyroidism, unspecified: Secondary | ICD-10-CM | POA: Diagnosis not present

## 2018-10-13 LAB — TSH: TSH: 6.58 u[IU]/mL — ABNORMAL HIGH (ref 0.35–4.50)

## 2018-10-13 LAB — BASIC METABOLIC PANEL
BUN: 15 mg/dL (ref 6–23)
CO2: 30 mEq/L (ref 19–32)
Calcium: 9.7 mg/dL (ref 8.4–10.5)
Chloride: 98 mEq/L (ref 96–112)
Creatinine, Ser: 0.75 mg/dL (ref 0.40–1.20)
GFR: 76.24 mL/min (ref >60.00)
Glucose, Bld: 333 mg/dL — ABNORMAL HIGH (ref 70–99)
Potassium: 4.4 mEq/L (ref 3.5–5.1)
Sodium: 135 mEq/L (ref 135–145)

## 2018-10-13 LAB — CBC
HCT: 43.9 % (ref 36.0–46.0)
Hemoglobin: 14.5 g/dL (ref 12.0–15.0)
MCHC: 33 g/dL (ref 30.0–36.0)
MCV: 85.8 fl (ref 78.0–100.0)
Platelets: 233 10*3/uL (ref 150.0–400.0)
RBC: 5.11 Mil/uL (ref 3.87–5.11)
RDW: 13.8 % (ref 11.5–15.5)
WBC: 5.4 10*3/uL (ref 4.0–10.5)

## 2018-10-13 LAB — POC URINALSYSI DIPSTICK (AUTOMATED)
Bilirubin, UA: NEGATIVE
Blood, UA: NEGATIVE
Glucose, UA: POSITIVE — AB
Leukocytes, UA: NEGATIVE
Nitrite, UA: NEGATIVE
Protein, UA: NEGATIVE
Spec Grav, UA: 1.015 (ref 1.010–1.025)
Urobilinogen, UA: 0.2 E.U./dL
pH, UA: 6.5 (ref 5.0–8.0)

## 2018-10-13 MED ORDER — NOVOLOG FLEXPEN 100 UNIT/ML ~~LOC~~ SOPN: 8.0000 [IU] | PEN_INJECTOR | Freq: Three times a day (TID) | SUBCUTANEOUS | 2 refills | Status: DC

## 2018-10-13 NOTE — Telephone Encounter (Signed)
Noted, orders changed

## 2018-10-13 NOTE — Assessment & Plan Note (Addendum)
Continued shortness of breath over the last several weeks.  Has been compliant to Advair over the last 5 days, using albuterol daily.  Compliant to Singulair.  Exam today stable, no wheezing.  She is speaking in complete sentences.  Suspect anxiety to be part of the reason for her shortness of breath.  Also repeating thyroid testing and adding CBC.  Check chest x-ray today. Referral placed to pulmonology for further management. She is stable for outpatient treatment.

## 2018-10-13 NOTE — Patient Instructions (Signed)
Complete xray(s) and labs prior to leaving today. I will notify you of your results once received.  You will be contacted regarding your referral to pulmonology.  Please let us know if you have not been contacted within one week.   Stop Victoza.  Start Novolog fast acting insulin. Inject 8 units into the skin before meals for blood sugars greater than 100.   Continue Basaglar 45 units for now.  Keep a close watch on blood sugars, check before every meal, 2 hours after every meal. Please update me if you see readings below 80 on a consistent basis or above 200 on a consistent basis.   Please send me blood sugar readings in 2 weeks.  It was a pleasure to see you today!

## 2018-10-13 NOTE — Assessment & Plan Note (Signed)
Sounds like she may have passed a kidney stone several evenings ago, unclear as to why she has mild right flank pain at this time.  UA today without evidence of blood or infection. Consider renal stone CT if symptoms persist. Now she is doing better and would like to hold off on testing at this time.  She will update.

## 2018-10-13 NOTE — Assessment & Plan Note (Signed)
Glucose readings ranging from high 100s to mid 400s over the last month.  Mostly 200s to 400s.  Compliant to Basaglar 45 units, Victoza daily, metformin. Unclear as to why her glucose levels have increased.  Repeat TSH pending. Stop Victoza daily. Continue basilar 45 units daily. NovoLog 8 units 3 times daily before meals.  She will send glucose readings in 2 weeks. Return precautions provided.

## 2018-10-13 NOTE — Assessment & Plan Note (Signed)
Present for the last 3 weeks despite treatment. Suspect anxiety playing a role, but will rule out other causes.  Repeat TSH pending.  CBC pending. Chest x-ray pending. Exam today stable without wheezing.  Referral placed to pulmonology for further evaluation.

## 2018-10-13 NOTE — Assessment & Plan Note (Signed)
Compliant to levothyroxine 75 mcg, repeat TSH pending.

## 2018-10-13 NOTE — Telephone Encounter (Signed)
Per pharmacy: Alternative Requested:NOT ON FORMULARY. PLEASE SEND ALTERNATE THERAPY. AS APPROPRIATE.

## 2018-10-13 NOTE — Progress Notes (Signed)
Subjective:    Patient ID: Molly Wall, female    DOB: [birth year], 73 y.o.   MRN: [id number]  HPI  Ms. 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  1) Flank Pain: Four days ago she noticed pain to her right flank region with radiation of pain down to her right groin. Later that evening she was urinating and when she went to wipe she noticed small "granules" in the toilet paper, and the pain suddenly resolved. Two evenings ago woke up from sleep with right flank pain without radiation, felt different that her prior pain. Kept her from sleeping well. She endorses drinking plenty of fluids. Now the pain has resolved but continues to notice a sharp, intermittent pain to the urethra.  She denies fevers, nausea, abdominal pain, hematuria.   2) Type 2 Diabetes:  Current medications include: Victoza daily, Metformin 1000 mg BID, Basaglar 45 units nightly.  She endorsed hypoglycemia since early July 2020.  Denies changes in diet or new medications.  She is checking her blood glucose 2 times daily and is getting readings of:  AM fasting: 100's-low 400's, mostly high 100's to 200's.  2 hours after dinner: 300's-400's  She is eating three meals daily, no changes in diet.   3) Moderate Asthma: Shortness of breath for the last 2-3 weeks, increased over the last 2-3 days. She was evaluated virutally on 09/29/18 for shortness of breath, difficulty "getting in a good breath", shallow breathing, etc. Treated with prednisone 40 mg daily burst. She took one dose of prednisone, noticed improvement in symptoms but began to notice glucose readings in the 500's so she stopped the prednisone after one dose.  She resumed her Advair five days ago and has been compliant since. She noticed improvement when she initially started the Advair,  increased SOB over the last 2 days, improved today. She is using her albuterol inhaler most days of the week.   Negative Covid test on 09/24/18. She denies cough, wheezing. She is compliant to Singulair daily. She does not see pulmonology.  Review of Systems  Constitutional: Negative for fever.  HENT: Negative for congestion.   Respiratory: Positive for shortness of breath. Negative for cough.   Cardiovascular: Negative for chest pain.  Genitourinary: Positive for flank pain. Negative for dysuria, frequency, hematuria and vaginal discharge.       Past Medical History:  Diagnosis Date  . Allergic rhinitis   . Allergy   . Asthma   . Atypical ductal hyperplasia of right breast   . 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 Breast cancer ([facility])   . Diabetes mellitus without complication ([facility])   . Family history of breast cancer   . Family history of colon cancer   . Hypercholesteremia   . Hypothyroidism   . Lumbar herniated disc   . OSA (obstructive sleep apnea)    used to use CPAP, not any more over 1 1/2 yrs  . Salivary stone    left side  . Sleep apnea    no cpap in 2 yrs   . Vitamin D deficiency      Social History   Socioeconomic History  . Marital status: Divorced    Spouse name: Not on file  . Number of children: 2  . Years of education: Not on file  . Highest education level: Not  on file  Occupational History  . Not on file  Social Needs  . Financial resource strain: Not on file  . Food insecurity    Worry: Not on file    Inability: Not on file  . Transportation needs    Medical: Not on file    Non-medical: Not on file  Tobacco Use  . Smoking status: Former Smoker    Types: Cigarettes  . Smokeless tobacco: Never Used  Substance and Sexual Activity  . Alcohol use: Yes    Alcohol/week: 0.0 - 1.0 standard drinks    Comment: 0-1 per month  . Drug use: No  . Sexual activity: Not Currently  Lifestyle  . Physical activity    Days per week:  Not on file    Minutes per session: Not on file  . Stress: Not on file  Relationships  . Social Herbalist on phone: Not on file    Gets together: Not on file    Attends religious service: Not on file    Active member of club or organization: Not on file    Attends meetings of clubs or organizations: Not on file    Relationship status: Not on file  . Intimate partner violence    Fear of current or ex partner: Not on file    Emotionally abused: Not on file    Physically abused: Not on file    Forced sexual activity: Not on file  Other Topics Concern  . Not on file  Social History Narrative   Single.   Moved from [place] to [place] several weeks ago.   Family lives in [place].   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    Past Surgical History:  Procedure Laterality Date  . APPENDECTOMY    . BREAST LUMPECTOMY WITH RADIOACTIVE SEED LOCALIZATION Right 08/12/2017   Procedure: RIGHT BREAST LUMPECTOMY X'S 2 WITH RADIOACTIVE SEED LOCALIZATION X'S 2;  Surgeon: Fanny Skates, MD;  Location: [facility];  Service: General;  Laterality: Right;  . COLONOSCOPY     11 yrs ago in [place] but given a 10 yr recall per pt   . GANGLION CYST EXCISION     73 years old  . INCISION AND DRAINAGE ABSCESS ANAL    . KNEE SURGERY  2014  . SHOULDER SURGERY  1976    Family History  Problem Relation Age of Onset  . Alzheimer's disease Mother   . Asthma Mother   . Dementia Mother        cause of death  . Arthritis Father   . Heart attack Father 23       cause of death  . Breast cancer Sister 9  . Colon cancer Paternal Grandmother   . Liver cancer Maternal Uncle   . Colon polyps Neg Hx   . Rectal cancer Neg Hx   . Stomach cancer Neg Hx     Allergies  Allergen Reactions  . Iodine-131 Anaphylaxis    Pt states 40 years ago she drank an experimental Iodinated Radioisotope for  her thyroid. She described it was a small amount in a milk carton in [place]. She states they stopped using it shortly after because the trial wasn't successful. SPM    Current Outpatient Medications on File Prior to Visit  Medication Sig Dispense Refill  . albuterol (PROAIR HFA) 108 (90 Base) MCG/ACT inhaler Inhale 2 puffs  into the lungs every 6 (six) hours as needed for wheezing or shortness of breath. NEED APPOINTMENT FOR ANY MORE REFILLS 18 g 0  . anastrozole (ARIMIDEX) 1 MG tablet Take 1 tablet (1 mg total) by mouth daily. 90 tablet 4  . atorvastatin (LIPITOR) 20 MG tablet Take 1 tablet by mouth daily for cholesterol. 90 tablet 3  . B-D UF III MINI PEN NEEDLES 31G X 5 MM MISC USE AS DIRECTED USE DAILY TO ADMINISTER VICTOZA 100 each 5  . fluticasone (FLONASE) 50 MCG/ACT nasal spray Place 2 sprays into both nostrils daily. 48 g 1  . Fluticasone-Salmeterol (ADVAIR) 250-50 MCG/DOSE AEPB Inhale 1 puff into the lungs 2 (two) times a day. 60 each 1  . Insulin Glargine (BASAGLAR KWIKPEN) 100 UNIT/ML SOPN Inject 0.45 mLs (45 Units total) into the skin every morning. 15 mL 5  . Lancets (ONETOUCH DELICA PLUS YYTKPT46F) MISC Inject 1 Device into the skin 3 (three) times daily. To check blood sugar 100 each 2  . levothyroxine (SYNTHROID) 75 MCG tablet TAKE 1 TABLET BY MOUTH EVERY MORNING ON AN EMPTY STOMACH. NO FOOD OR OTHER MEDICATIONS FOR 30 MINUTES. 30 tablet 2  . metformin (FORTAMET) 1000 MG (OSM) 24 hr tablet Take 1 tablet (1,000 mg total) by mouth daily with breakfast. 90 tablet 3  . montelukast (SINGULAIR) 10 MG tablet Take 1 tablet (10 mg total) by mouth at bedtime. For allergies and asthma. 90 tablet 3  . ONETOUCH VERIO test strip USE AS DIRECTED TO CHECK FASTING BLOOD SUGARS THREE TIMES DAILY. 100 each 3  . VICTOZA 18 MG/3ML SOPN Inject 0.3 mLs (1.8 mg total) into the skin daily. For diabetes. 3 mL 5   No current facility-administered medications on file prior to visit.     BP 116/60    Pulse 80   Temp 98.2 F (36.8 C) (Temporal)   Ht 5\' 5"  (1.651 m)   Wt 186 lb 4 oz (84.5 kg)   SpO2 98%   BMI 30.99 kg/m    Objective:   Physical Exam  Constitutional: She appears well-nourished.  Neck: Neck supple.  Cardiovascular: Normal rate and regular rhythm.  Respiratory: Effort normal and breath sounds normal.  GI: Normal appearance. There is no abdominal tenderness. There is no CVA tenderness.  Skin: Skin is warm and dry.  Psychiatric: She has a normal mood and affect.           Assessment & Plan:

## 2018-10-14 ENCOUNTER — Other Ambulatory Visit: Payer: Self-pay | Admitting: Primary Care

## 2018-10-14 DIAGNOSIS — E039 Hypothyroidism, unspecified: Secondary | ICD-10-CM

## 2018-10-14 MED ORDER — LEVOTHYROXINE SODIUM 88 MCG PO TABS: ORAL_TABLET | ORAL | 0 refills | Status: DC

## 2018-10-18 ENCOUNTER — Telehealth: Payer: Self-pay | Admitting: Pulmonary Disease

## 2018-10-18 NOTE — Telephone Encounter (Signed)
Called patient for COVID-19 pre-screening for in office visit.  Have you recently traveled any where out of the local area in the last 2 weeks? No  Have you been in close contact with a person diagnosed with COVID-19 or someone awaiting results within the last 2 weeks? Yes   **Was in the same home with someone who is getting tested today due to loss of taste. Pt stated that she did not come into very close contact with her just was in the same home at the same time.   Do you currently have any of the following symptoms? If so, when did they start? Cough     Diarrhea   Joint Pain Fever      Muscle Pain   Red eyes Shortness of breath (Yes- 4 days)   Abdominal pain             Vomiting Loss of smell    Rash    Sore Throat Headache    Weakness   Bruising or bleeding   Okay to proceed with visit. (date)  / Needs to reschedule visit. (date)

## 2018-10-18 NOTE — Telephone Encounter (Signed)
Recommend phone visit.

## 2018-10-19 NOTE — Telephone Encounter (Signed)
Spoke with pt to advise her of recommended phone visit due to her potential exposure and SOB. Pt expressed her concern that she would like to do whatever she needs to do but she really would prefer an office visit so that her breathing can be examined.  She stated that the individual who is getting tested has had a loss of taste and smell for over a month. She would like a phone call for recommendations.

## 2018-10-19 NOTE — Telephone Encounter (Signed)
Pt notified that due to her possible exposure it is recommended that she be changed to a phone visit. Pt verbalized understanding. Explained to pt that if it is determined that she has had no exposure then she will be able to f/u in office if she wishes.  No further action necessary.

## 2018-10-20 ENCOUNTER — Institutional Professional Consult (permissible substitution): Payer: BC Managed Care – PPO | Admitting: Pulmonary Disease

## 2018-10-20 ENCOUNTER — Ambulatory Visit (INDEPENDENT_AMBULATORY_CARE_PROVIDER_SITE_OTHER): Payer: BC Managed Care – PPO | Admitting: Pulmonary Disease

## 2018-10-20 ENCOUNTER — Encounter: Payer: Self-pay | Admitting: Pulmonary Disease

## 2018-10-20 DIAGNOSIS — Z794 Long term (current) use of insulin: Secondary | ICD-10-CM

## 2018-10-20 DIAGNOSIS — R739 Hyperglycemia, unspecified: Secondary | ICD-10-CM | POA: Diagnosis not present

## 2018-10-20 DIAGNOSIS — E119 Type 2 diabetes mellitus without complications: Secondary | ICD-10-CM

## 2018-10-20 DIAGNOSIS — T380X5A Adverse effect of glucocorticoids and synthetic analogues, initial encounter: Secondary | ICD-10-CM

## 2018-10-20 DIAGNOSIS — J4541 Moderate persistent asthma with (acute) exacerbation: Secondary | ICD-10-CM | POA: Diagnosis not present

## 2018-10-20 MED ORDER — BASAGLAR KWIKPEN 100 UNIT/ML ~~LOC~~ SOPN: 45.0000 [IU] | PEN_INJECTOR | SUBCUTANEOUS | 5 refills | Status: DC

## 2018-10-20 MED ORDER — FLUTICASONE-SALMETEROL 500-50 MCG/DOSE IN AEPB: 1.0000 | INHALATION_SPRAY | Freq: Two times a day (BID) | RESPIRATORY_TRACT | 0 refills | Status: DC

## 2018-10-20 NOTE — Patient Instructions (Addendum)
New prescription: Advair (or Wixela) 500-50 strength, one inhalation twice a day. Rinse mouth thoroughly after use. You will use this inhaler in place of the 250-50 strength that you currently have. I will likely recommend resuming the 250-50 strength inhaler once this inhaler is completed  Continue Singulair 10 mg daily - best taken @ bedtime  Continue albuterol inhaler as needed for increased SOB, chest tightness, wheezing, cough  Follow up 08/24 for 30 min follow up in person visit

## 2018-10-20 NOTE — Progress Notes (Signed)
PULMONARY CONSULT NOTE  Requesting MD/Service: Alma Friendly, NP Date of initial consultation: 10/20/18 Reason for consultation: Poorly controlled asthma  PT PROFILE: 73 y.o. female with minimal smoking history (smoked briefly as a teenager) who was diagnosed with asthma at age 85 while living in [place].  Historically, her asthma has been mild intermittent and she has not required controller medication.  Moved to [facility] 2016.  Over the past year, has noted increased asthma symptoms, particularly bad this summer.  Started back on Advair three weeks prior to this evaluation.  DATA:  Virtual Visit via Telephone Note I connected with Molly Wall on 10/20/18 at 11:15 AM EDT by telephone and verified that I am speaking with the correct person using two identifiers. I discussed the limitations, risks, security and privacy concerns of performing an evaluation and management service by telephone and the availability of in person appointments. I also discussed with the patient that there may be a patient responsible charge related to this service. The patient expressed understanding and agreed to proceed.   INTERVAL:  HPI:  This encounter was initially to be in person.  However, she reported a possible SARS-CoV-2 exposure and therefore it was changed to a virtual encounter.    Her asthma was diagnosed at approximately age 71.  At that time her symptoms were "allergies" which would progress to "bronchitis" and shortness of breath.  She did have PFTs performed in [place].  Results of this study are not available to [place].  Previously she was treated with albuterol with improvement in symptoms.  She has previously been on prednisone for asthma exacerbations with improvement in symptoms.  She was ultimately treated with Advair but discontinued this on her own and went approximately 10 years with only Singulair as her controller medication.  During this time, she rarely required albuterol.   She moved to the [place] approximately 4 years prior to this evaluation.  She noted a year ago increasing asthma symptoms while in [place].  Over the past 3 weeks she has had great difficulty with increased shortness of breath, chest tightness, burning sensation in her lungs, throat clearing cough and central chest discomfort.  Her symptoms are transiently relieved by albuterol.  Her primary provider initiated Wixela 250/50 which she is taking twice a day.  She remains on Singulair.  She was prescribed a course of prednisone and took 1 dose of 40 mg with marked increase in blood glucoses.  Therefore, prednisone was discontinued.  She denies fever, purulent sputum, hemoptysis, LE edema and calf tenderness.  She does report nasal congestion -and uses Flonase nightly, she has no GERD symptoms.   She has no significant occupational environmental exposures.  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  She has no significant travel history.      Past Medical History:  Diagnosis Date  . Allergic rhinitis   . Allergy   . Asthma   . Atypical ductal hyperplasia of right breast   . 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 Breast cancer ([facility])   . Diabetes mellitus without complication ([facility])   . Family history of breast cancer   . Family history of colon cancer   . Hypercholesteremia   . Hypothyroidism   . Lumbar herniated disc   . OSA (obstructive sleep apnea)    used to use CPAP, not any more over 1 1/2 yrs  . Salivary stone    left side  . Sleep  apnea    no cpap in 2 yrs   . Vitamin D deficiency     Past Surgical History:  Procedure Laterality Date  . APPENDECTOMY    . BREAST LUMPECTOMY WITH RADIOACTIVE SEED LOCALIZATION Right 08/12/2017   Procedure: RIGHT BREAST LUMPECTOMY X'S 2 WITH RADIOACTIVE SEED LOCALIZATION X'S 2;  Surgeon: Fanny Skates, MD;  Location: [facility];  Service: General;  Laterality: Right;  .  COLONOSCOPY     11 yrs ago in [place] but given a 10 yr recall per pt   . GANGLION CYST EXCISION     73 years old  . INCISION AND DRAINAGE ABSCESS ANAL    . KNEE SURGERY  2014  . SHOULDER SURGERY  1976    MEDICATIONS: I have reviewed all medications and confirmed regimen as documented  Social History   Socioeconomic History  . Marital status: Divorced    Spouse name: Not on file  . Number of children: 2  . Years of education: Not on file  . Highest education level: Not on file  Occupational History  . Not on file  Social Needs  . Financial resource strain: Not on file  . Food insecurity    Worry: Not on file    Inability: Not on file  . Transportation needs    Medical: Not on file    Non-medical: Not on file  Tobacco Use  . Smoking status: Former Smoker    Types: Cigarettes  . Smokeless tobacco: Never Used  Substance and Sexual Activity  . Alcohol use: Yes    Alcohol/week: 0.0 - 1.0 standard drinks    Comment: 0-1 per month  . Drug use: No  . Sexual activity: Not Currently  Lifestyle  . Physical activity    Days per week: Not on file    Minutes per session: Not on file  . Stress: Not on file  Relationships  . Social Herbalist on phone: Not on file    Gets together: Not on file    Attends religious service: Not on file    Active member of club or organization: Not on file    Attends meetings of clubs or organizations: Not on file    Relationship status: Not on file  . Intimate partner violence    Fear of current or ex partner: Not on file    Emotionally abused: Not on file    Physically abused: Not on file    Forced sexual activity: Not on file  Other Topics Concern  . Not on file  Social History Narrative   Single.   Moved from [place] to [place] several weeks ago.   Family lives in [place].   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    Family  History  Problem Relation Age of Onset  . Alzheimer's disease Mother   . Asthma Mother   . Dementia Mother        cause of death  . Arthritis Father   . Heart attack Father 77       cause of death  . Breast cancer Sister 44  . Colon cancer Paternal Grandmother   . Liver cancer Maternal Uncle   . Colon polyps Neg Hx   . Rectal cancer Neg Hx   . Stomach cancer Neg Hx     ROS: No fever, myalgias/arthralgias, unexplained weight loss or weight gain No  new focal weakness or sensory deficits No otalgia, hearing loss, visual changes, nasal and sinus symptoms, mouth and throat problems No neck pain or adenopathy No abdominal pain, N/V/D, diarrhea, change in bowel pattern No dysuria, change in urinary pattern   There were no vitals filed for this visit.   EXAM:  Due to the remote nature of this encounter, no physical examination could be performed  DATA:   BMP Latest Ref Rng & Units 10/13/2018 08/11/2018 12/09/2017  Glucose 70 - 99 mg/dL 333(H) - 316(H)  BUN 6 - 23 mg/dL 15 - 13  Creatinine 0.40 - 1.20 mg/dL 0.75 0.50 0.87  Sodium 135 - 145 mEq/L 135 - 132(L)  Potassium 3.5 - 5.1 mEq/L 4.4 - 4.4  Chloride 96 - 112 mEq/L 98 - 95(L)  CO2 19 - 32 mEq/L 30 - 29  Calcium 8.4 - 10.5 mg/dL 9.7 - 9.9    CBC Latest Ref Rng & Units 10/13/2018 08/06/2017 01/09/2016  WBC 4.0 - 10.5 K/uL 5.4 4.8 7.7  Hemoglobin 12.0 - 15.0 g/dL 14.5 14.4 13.7  Hematocrit 36.0 - 46.0 % 43.9 45.8 41.4  Platelets 150.0 - 400.0 K/uL 233.0 230 243.0    CXR 07/29: Normal  I have personally reviewed all chest radiographs reported above including CXRs and CT chest unless otherwise indicated  IMPRESSION:     ICD-10-CM   1. Moderate persistent asthma with acute exacerbation  J45.41   2. Steroid-induced hyperglycemia  R73.9    T38.0X5A    The reason for poor asthma control is unclear.  It sounds like the diagnosis is accurate.  She is compliant with medications and I trust that she is using them effectively.   She has no obvious internal exacerbating factors such as chronic sinusitis or GERD.  She takes no medications which might be conflicting with asthma control.  The only thing that she can identify as an external exacerbating factor is the hot and humid weather.  She has recently been intolerant to a modest dose of prednisone with severe worsening of hyperglycemia.  Therefore, we will change to high-dose steroid inhaler to see if this offers improved control  PLAN:  New prescription: Advair (or Wixela) 500-50 strength, one inhalation twice a day. Rinse mouth thoroughly after use. She will use this inhaler in place of the 250-50 strength until it is completed, then resume the 250-50 strength  Continue Singulair 10 mg daily - best taken @ bedtime  Continue albuterol inhaler as needed for increased SOB, chest tightness, wheezing, cough  Follow up 08/24 for 30 min follow up in person visit    Merton Border, MD PCCM service Mobile [phone] Pager [phone] 10/20/2018 2:06 PM

## 2018-10-26 ENCOUNTER — Other Ambulatory Visit: Payer: Self-pay

## 2018-10-26 ENCOUNTER — Other Ambulatory Visit: Payer: Self-pay | Admitting: Primary Care

## 2018-10-26 ENCOUNTER — Ambulatory Visit
Admission: RE | Admit: 2018-10-26 | Discharge: 2018-10-26 | Disposition: A | Discharge status: Home / Self Care | Payer: BC Managed Care – PPO | Source: Ambulatory Visit | Attending: Oncology | Admitting: Oncology

## 2018-10-26 ENCOUNTER — Other Ambulatory Visit: Payer: Self-pay | Admitting: Oncology

## 2018-10-26 DIAGNOSIS — Z1231 Encounter for screening mammogram for malignant neoplasm of breast: Secondary | ICD-10-CM

## 2018-10-26 MED ORDER — GADOBUTROL 1 MMOL/ML IV SOLN
8.0000 mL | Freq: Once | INTRAVENOUS | Status: AC | PRN
  Administered 2018-10-26: 8 mL via INTRAVENOUS

## 2018-10-27 ENCOUNTER — Other Ambulatory Visit: Payer: Self-pay

## 2018-10-27 ENCOUNTER — Encounter: Payer: Self-pay | Admitting: Primary Care

## 2018-10-27 ENCOUNTER — Ambulatory Visit (INDEPENDENT_AMBULATORY_CARE_PROVIDER_SITE_OTHER): Payer: BC Managed Care – PPO | Admitting: Primary Care

## 2018-10-27 VITALS — BP 114/64 | HR 76 | Temp 98.2°F | Ht 65.0 in | Wt 192.5 lb

## 2018-10-27 DIAGNOSIS — E119 Type 2 diabetes mellitus without complications: Secondary | ICD-10-CM | POA: Diagnosis not present

## 2018-10-27 DIAGNOSIS — Z794 Long term (current) use of insulin: Secondary | ICD-10-CM | POA: Diagnosis not present

## 2018-10-27 LAB — POCT GLYCOSYLATED HEMOGLOBIN (HGB A1C): Hemoglobin A1C: 9.6 % — AB (ref 4.0–5.6)

## 2018-10-27 MED ORDER — INSULIN LISPRO (1 UNIT DIAL) 100 UNIT/ML (KWIKPEN): 10.0000 [IU] | PEN_INJECTOR | Freq: Three times a day (TID) | SUBCUTANEOUS | 2 refills | Status: DC | PRN

## 2018-10-27 NOTE — Progress Notes (Signed)
Subjective:    Patient ID: Molly Wall, female    DOB: [birth year], 73 y.o.   MRN: [id number]  HPI  Molly Wall is a 73 year old female who presents today for follow up of diabetes.  Current medications include: Basaglar 45 units daily, Metformin ER 1000 mg daily, Humalog 8 units TID with meals.  She is checking her blood glucose 3 times daily and is getting readings of:  Before breakfast: mid 100's to mid 200's Before lunch: mid 100's to mid 200's, mostly mid to high 100's Before dinner: high 200's to low 400's, mostly 300's-400's  Last A1C: 8.2 in May 2020 Last Eye Exam: Due in November 2020 Last Foot Exam: Due in May 2021 Pneumonia Vaccination: Completed in 2016 ACE/ARB: Urine microalbumin negative in February 2020 Statin: atorvastatin   Diet currently consists of: She is following with the diabetic nutritionist   Breakfast: Fruit with peanut butter, english muffin Lunch: Vegetables, chicken; eating out at times Dinner: Vegetables, chicken; eating out at times Snacks: Nuts mostly, chips  Desserts: Outshine bars twice daily  Beverages: Water, Zero Water, coffee, infrequent wine  Exercise: She is not exercising regularly   BP Readings from Last 3 Encounters:  10/27/18 114/64  10/13/18 116/60  09/28/18 115/62     Review of Systems  Eyes: Negative for visual disturbance.  Respiratory: Negative for shortness of breath.   Cardiovascular: Negative for chest pain.  Neurological: Negative for dizziness, numbness and headaches.       Past Medical History:  Diagnosis Date   Allergic rhinitis    Allergy    Asthma    Atypical ductal hyperplasia of right breast    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   Breast cancer ([place])    Diabetes mellitus without complication (HCC)    Family history of breast cancer    Family history of colon cancer    Hypercholesteremia    Hypothyroidism    Lumbar herniated disc    OSA (obstructive sleep  apnea)    used to use CPAP, not any more over 1 1/2 yrs   Salivary stone    left side   Sleep apnea    no cpap in 2 yrs    Vitamin D deficiency      Social History   Socioeconomic History   Marital status: Divorced    Spouse name: Not on file   Number of children: 2   Years of education: Not on file   Highest education level: Not on file  Occupational History   Not on file  Social Needs   Financial resource strain: Not on file   Food insecurity    Worry: Not on file    Inability: Not on file   Transportation needs    Medical: Not on file    Non-medical: Not on file  Tobacco Use   Smoking status: Former Smoker    Types: Cigarettes   Smokeless tobacco: Never Used  Substance and Sexual Activity   Alcohol use: Yes    Alcohol/week: 0.0 - 1.0 standard drinks    Comment: 0-1 per month   Drug use: No   Sexual activity: Not Currently  Lifestyle   Physical activity    Days per week: Not on file    Minutes per session: Not on file   Stress: Not on file  Relationships   Social connections    Talks on phone: Not on file    Gets together: Not on file  Attends religious service: Not on file    Active member of club or organization: Not on file    Attends meetings of clubs or organizations: Not on file    Relationship status: Not on file   Intimate partner violence    Fear of current or ex partner: Not on file    Emotionally abused: Not on file    Physically abused: Not on file    Forced sexual activity: Not on file  Other Topics Concern   Not on file  Social History Narrative   Single.   Moved from [place] to [place] several weeks ago.   Family lives in [place].   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    Past Surgical History:  Procedure Laterality Date   APPENDECTOMY     BREAST LUMPECTOMY WITH RADIOACTIVE SEED LOCALIZATION Right 08/12/2017   Procedure:  RIGHT BREAST LUMPECTOMY X'S 2 WITH RADIOACTIVE SEED LOCALIZATION X'S 2;  Surgeon: Fanny Skates, MD;  Location: [facility];  Service: General;  Laterality: Right;   COLONOSCOPY     11 yrs ago in [place] but given a 10 yr recall per pt    GANGLION CYST EXCISION     73 years old   [place]  2014   [place]    Family History  Problem Relation Age of Onset   Alzheimer's disease Mother    Asthma Mother    Dementia Mother        cause of death   Arthritis Father    Heart attack Father 30       cause of death   Breast cancer Sister 98   Colon cancer Paternal Grandmother    Liver cancer Maternal Uncle    Colon polyps Neg Hx    Rectal cancer Neg Hx    Stomach cancer Neg Hx     Allergies  Allergen Reactions   Iodine-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 She described it was a small amount in a milk carton in [place]. She states they stopped using it shortly after because the trial wasn't successful. SPM    Current Outpatient Medications on File Prior to Visit  Medication Sig Dispense Refill   albuterol (PROAIR HFA) 108 (90 Base) MCG/ACT inhaler Inhale 2 puffs into the lungs every 6 (six) hours as needed for wheezing or shortness of breath. NEED APPOINTMENT FOR ANY MORE REFILLS 18 g 0   anastrozole (ARIMIDEX) 1 MG tablet Take 1 tablet (1 mg total) by mouth daily. 90 tablet 4   atorvastatin (LIPITOR) 20 MG tablet Take 1 tablet by mouth daily for cholesterol. 90 tablet 3   B-D UF III MINI PEN NEEDLES 31G X 5 MM MISC USE AS DIRECTED USE DAILY TO ADMINISTER VICTOZA 100 each 5   fluticasone (FLONASE) 50 MCG/ACT nasal spray Place 2 sprays into both nostrils daily. 48 g 1   Fluticasone-Salmeterol (ADVAIR DISKUS) 500-50 MCG/DOSE AEPB Inhale 1 puff into the lungs 2 (two) times daily. Hold Advair/Wixela 250/50 while on  this higher strength formulation. 60 each 0   Fluticasone-Salmeterol (ADVAIR) 250-50 MCG/DOSE AEPB Inhale 1 puff into the lungs 2 (two) times a day. 60 each 1   Insulin Glargine (BASAGLAR KWIKPEN) 100 UNIT/ML SOPN Inject 0.45 mLs (45 Units total) into  the skin every morning. 15 mL 5   insulin lispro (HUMALOG KWIKPEN) 100 UNIT/ML KwikPen Inject 0.08 mLs (8 Units total) into the skin 3 (three) times daily with meals as needed. 15 mL 2   Lancets (ONETOUCH DELICA PLUS [id number]) MISC INJECT 1 DEVICE INTO THE SKIN 3 (THREE) TIMES DAILY. TO CHECK BLOOD SUGAR 100 each 2   levothyroxine (SYNTHROID) 88 MCG tablet Take 1 tablet by mouth every morning on an empty stomach with water only.  No food or other medications for 30 minutes. 90 tablet 0   metformin (FORTAMET) 1000 MG (OSM) 24 hr tablet Take 1 tablet (1,000 mg total) by mouth daily with breakfast. 90 tablet 3   montelukast (SINGULAIR) 10 MG tablet Take 1 tablet (10 mg total) by mouth at bedtime. For allergies and asthma. 90 tablet 3   ONETOUCH VERIO test strip USE AS DIRECTED TO CHECK FASTING BLOOD SUGARS THREE TIMES DAILY. 100 each 3   [DISCONTINUED] insulin aspart (NOVOLOG FLEXPEN) 100 UNIT/ML FlexPen Inject 8 Units into the skin 3 (three) times daily with meals. For blood sugars greater than 100. 15 mL 2   No current facility-administered medications on file prior to visit.     BP 114/64    Pulse 76    Temp 98.2 F (36.8 C) (Temporal)    Ht 5\' 5"  (1.651 m)    Wt 192 lb 8 oz (87.3 kg)    SpO2 97%    BMI 32.03 kg/m    Objective:   Physical Exam  Constitutional: She appears well-nourished.  Neck: Neck supple.  Cardiovascular: Normal rate and regular rhythm.  Respiratory: Effort normal and breath sounds normal.  Skin: Skin is warm and dry.  Psychiatric: She has a normal mood and affect.           Assessment & Plan:

## 2018-10-27 NOTE — Patient Instructions (Addendum)
Continue basaglar insulin 45 units daily.  Increase your Humaglog to 10 units three times daily before meals for blood sugars at or above 100.  Continue Metformin 1000 mg every morning.   Do not take Victoza.  Continue to monitor your blood sugars before meals.  Schedule a follow up visit with me for 3-4 weeks for diabetes check.  It was a pleasure to see you today!

## 2018-10-27 NOTE — Assessment & Plan Note (Signed)
Glucose readings seem to have improved since initiation of Humalog, still above goal before meals. Compliant to other medications.  Increase Humalog to 10 units TID with meals for glucose readings at or above 100. Continue basaglar 45 units daily, continue Metformin 1000 mg daily. Continue off of Victoza.  A1C today of 9.6 which is expected given recent hyperglycemic episodes. We will plan to see her back in the office in 3-4 weeks with her glucose logs to ensure she continues to improve.

## 2018-10-28 ENCOUNTER — Encounter: Payer: Self-pay | Admitting: Pain Medicine

## 2018-10-29 DIAGNOSIS — M4602 Spinal enthesopathy, cervical region: Secondary | ICD-10-CM | POA: Diagnosis not present

## 2018-10-29 DIAGNOSIS — M542 Cervicalgia: Secondary | ICD-10-CM | POA: Diagnosis not present

## 2018-10-29 DIAGNOSIS — M9901 Segmental and somatic dysfunction of cervical region: Secondary | ICD-10-CM | POA: Diagnosis not present

## 2018-10-29 DIAGNOSIS — M62830 Muscle spasm of back: Secondary | ICD-10-CM | POA: Diagnosis not present

## 2018-10-31 DIAGNOSIS — G894 Chronic pain syndrome: Secondary | ICD-10-CM | POA: Insufficient documentation

## 2018-10-31 NOTE — Progress Notes (Signed)
Pain Management Virtual Encounter Note - Virtual Visit via Telephone Telehealth (real-time audio visits between healthcare provider and patient).   Patient's Phone No. & Preferred Pharmacy:  [phone] (home); [phone] (mobile); (Preferred) [phone] [email or web address]  [id number] [name], [place] - [address] [place] [address] Phone: [phone] Fax: [phone]    Pre-screening note:  Our staff contacted Molly Wall and offered her an "in person", "face-to-face" appointment versus a telephone encounter. She indicated preferring the telephone encounter, at this time.   Reason for Virtual Visit: COVID-19*  Social distancing based on CDC and AMA recommendations.   I contacted Molly Wall on 11/01/2018 via telephone.      I clearly identified myself as Gaspar Cola, MD. I verified that I was speaking with the correct person using two identifiers (Name: Molly Wall, and date of birth: [birth year]).  Advanced Informed Consent I sought verbal advanced consent from [place] for virtual visit interactions. I informed Molly Wall of possible security and privacy concerns, risks, and limitations associated with providing "not-in-person" medical evaluation and management services. I also informed Molly Wall of the availability of "in-person" appointments. Finally, I informed her that there would be a charge for the virtual visit and that she could be  personally, fully or partially, financially responsible for it. Molly Wall expressed understanding and agreed to proceed.   Historic Elements   Molly Wall is a 73 y.o. year old, female patient evaluated today after her last encounter by our practice on 09/29/2018. Molly Wall  has a past medical history of Allergic rhinitis, Allergy, Asthma, Atypical ductal hyperplasia of right breast, Atypical ductal hyperplasia of right breast (08/12/2017), Breast cancer ([place]), Diabetes  mellitus without complication ([place]), Family history of breast cancer, Family history of colon cancer, Hypercholesteremia, Hypothyroidism, Lumbar herniated disc, OSA (obstructive sleep apnea), Salivary stone, Sleep apnea, and Vitamin D deficiency. She also  has a past surgical history that includes Shoulder surgery (1976); Knee surgery (2014); Appendectomy; Ganglion cyst excision; Incision and drainage abscess anal; Colonoscopy; and Breast lumpectomy with radioactive seed localization (Right, 08/12/2017). Molly Wall has a current medication list which includes the following prescription(s): albuterol, anastrozole, atorvastatin, b-d uf iii mini pen needles, fluticasone, fluticasone-salmeterol, fluticasone-salmeterol, basaglar kwikpen, insulin lispro, onetouch delica plus [id number], levothyroxine, metformin, montelukast, onetouch verio, and novolog flexpen. She  reports that she has quit smoking. Her smoking use included cigarettes. She has never used smokeless tobacco. She reports current alcohol use. She reports that she does not use drugs. Molly Wall is allergic to iodine-131.   HPI  Today, she is being contacted for a post-procedure assessment.  She indicates having had good relief while the local anesthetic was in place.  However, once it wore off then the benefit went down to about 50% that lasted for about 2 weeks.  Right now her primary pain seems to be in the area of the right knee followed by the low back pain in the midline area.  She also will occasionally experience pain in the neck and shoulders which she has identified as being secondary to tension.  Whenever we relieve her lower back pain this pain in the neck and shoulder goes away.  She is currently not taking any medications from [place].  At this point, I will be bringing her back for a diagnostic right intra-articular knee joint injection.  The patient indicates that she will be leaving for [place] and she will be out of the country for approximately  3  months and she will like to be able to walk without any problems before she leaves.  We will try to get her in as soon as possible.  Post-Procedure Evaluation  Procedure: Therapeutic right-sided L4-5 lumbar epidural steroid injection #3 under fluoroscopic guidance and IV sedation Pre-procedure pain level:  3/10 Post-procedure: 1/10 (> 50% relief)  Sedation: Please see nurses note.  Effectiveness during initial hour after procedure(Ultra-Short Term Relief): 100 %   Local anesthetic used: Long-acting (4-6 hours) Effectiveness: Defined as any analgesic benefit obtained secondary to the administration of local anesthetics. This carries significant diagnostic value as to the etiological location, or anatomical origin, of the pain. Duration of benefit is expected to coincide with the duration of the local anesthetic used.  Effectiveness during initial 4-6 hours after procedure(Short-Term Relief): 100 %   Long-term benefit: Defined as any relief past the pharmacologic duration of the local anesthetics.  Effectiveness past the initial 6 hours after procedure(Long-Term Relief): 50 %(2 weeks)   Current benefits: Defined as benefit that persist at this time.   Analgesia:  50% improved Function: Somewhat improved ROM: Somewhat improved  Pharmacotherapy Assessment  Analgesic: No opioid analgesics from our practice.   Monitoring: Pharmacotherapy: No side-effects or adverse reactions reported. [place] PMP: PDMP reviewed during this encounter.       Compliance: No problems identified. Effectiveness: Clinically acceptable. Plan: Refer to "POC".  UDS: No results found for: SUMMARY Laboratory Chemistry Profile (12 mo)  Renal: 10/13/2018: BUN 15; Creatinine, Ser 0.75  Lab Results  Component Value Date   GFRAA >60 08/06/2017   GFRNONAA >60 08/06/2017   Hepatic: 12/09/2017: Albumin 4.2 Lab Results  Component Value Date   AST 12 12/09/2017   ALT 13 12/09/2017   Other: No results found for requested  labs within last 8760 hours. Note: Above Lab results reviewed.  Imaging  Last 90 days:  Dg Chest 2 View  Result Date: 10/14/2018 CLINICAL DATA:  Shortness of breath EXAM: CHEST - 2 VIEW COMPARISON:  None. FINDINGS: Heart size and mediastinal contours are within normal limits. Lungs are clear. No pleural effusion or pneumothorax seen. Osseous structures about the chest are unremarkable. IMPRESSION: No active cardiopulmonary disease. No evidence of pneumonia or pulmonary edema. Electronically Signed   By: Franki Cabot M.D.   On: 10/14/2018 13:14   Ct Soft Tissue Neck W Contrast  Result Date: 08/11/2018 CLINICAL DATA:  Neck mass left neck mass with swelling. Left TMJ difficulty. EXAM: CT NECK WITH CONTRAST TECHNIQUE: Multidetector CT imaging of the neck was performed using the standard protocol following the bolus administration of intravenous contrast. CONTRAST:  41mL OMNIPAQUE IOHEXOL 300 MG/ML  SOLN COMPARISON:  None. FINDINGS: Pharynx and larynx: Normal. No mass or swelling. Salivary glands: Ill-defined area of increased enhancement involving the posterior left parotid without masslike features. Remainder of the left parotid normal. Right parotid normal. 1 mm nonobstructing calcification left inferior submandibular gland without evidence of acute inflammation. Right submandibular gland normal. Thyroid: Hypoplastic thyroid.  Minimal thyroid tissue identified. Lymph nodes: No enlarged lymph nodes in the neck. Vascular: Atherosclerotic calcification carotid bifurcation bilaterally. Limited intracranial: Negative Visualized orbits: Negative Mastoids and visualized paranasal sinuses: Mucosal edema paranasal sinuses. Air-fluid level in the maxillary and sphenoid sinuses. Mastoid sinus clear bilaterally. Skeleton: Cervical spondylosis.  No acute skeletal abnormality. Upper chest: Lung apices clear bilaterally. Other: None IMPRESSION: Ill-defined area of hyper enhancement in the left posterior parotid gland.  Question acute or chronic inflammation. Neoplasm not felt to be likely but  close follow-up is recommended. 1 mm nonobstructing calcification left inferior submandibular gland without evidence of acute inflammation. Electronically Signed   By: Franchot Gallo M.D.   On: 08/11/2018 16:35   Mr Breast Bilateral W Wo Contrast Inc Cad  Result Date: 10/26/2018 CLINICAL DATA:  High risk of developing breast cancer with a calculated lifetime risk of developing breast cancer of greater than 20%. Previous right breast excisional biopsy for ADH and ALH on 08/12/2017. Previous benign MR guided right breast biopsy. LABS:  None obtained on site today. EXAM: BILATERAL BREAST MRI WITH AND WITHOUT CONTRAST TECHNIQUE: Multiplanar, multisequence MR images of both breasts were obtained prior to and following the intravenous administration of 8 ml of Gadavist Three-dimensional MR images were rendered by post-processing of the original MR data on an independent workstation. The three-dimensional MR images were interpreted, and findings are reported in the following complete MRI report for this study. Three dimensional images were evaluated at the independent DynaCad workstation COMPARISON:  Previous examinations, including the bilateral diagnostic mammogram dated 06/25/2018, bilateral breast MRI dated 01/30/2018 and right breast MR guided core needle biopsy dated 02/23/2018. FINDINGS: Breast composition: b. Scattered fibroglandular tissue. Background parenchymal enhancement: Mild. Right breast: Postsurgical scarring in the medial aspect of the breast anteriorly at the location of previously demonstrated abnormal enhancement. The previously demonstrated abnormal enhancement at that location is no longer seen. No mass or abnormal enhancement elsewhere in the right breast. Left breast: No mass or abnormal enhancement. The previously demonstrated enhancement of the left nipple areolar complex and retroareolar tissue has resolved. Lymph  nodes: No abnormal appearing lymph nodes. Ancillary findings: They are 2 adjacent masses in the superior aspect of the left lobe of the liver. The larger is oval and circumscribed, measuring 4.3 cm in maximum diameter. The smaller is also oval and circumscribed. They both demonstrate homogeneous high signal intensity on the inversion recovery images. The postcontrast images do not include this portion of the liver. IMPRESSION: 1. No evidence of malignancy in either breast. 2. Adjacent 4.3 and 1.7 cm masses in the superior aspect of the left lobe of the liver, just inferior to the heart. Statistically, these most likely represent cysts or hemangiomas. However, other masses cannot be excluded. Further evaluation with a limited right upper quadrant abdomen ultrasound is recommended. RECOMMENDATION: 1. Limited right upper quadrant abdomen ultrasound to evaluate the 2 masses in the left lobe of the liver. 2. Bilateral screening mammogram in 8 months when due. 3. Annual screening MRI of the breasts. BI-RADS CATEGORY  2: Benign. Electronically Signed   By: Claudie Revering M.D.   On: 10/26/2018 15:53   Dg Pain Clinic C-arm 1-60 Min No Report  Result Date: 09/28/2018 Fluoro was used, but no Radiologist interpretation will be provided. Please refer to "NOTES" tab for provider progress note.  Assessment  The primary encounter diagnosis was Chronic knee pain (intermittent) (Right). Diagnoses of Chronic pain syndrome, Chronic lower extremity pain (Primary Area of Pain) (Right), and Chronic low back pain (Secondary area of Pain) (Bilateral) (R>L) were also pertinent to this visit.  Plan of Care  I am having Molly Wall maintain her fluticasone, albuterol, metformin, atorvastatin, anastrozole, montelukast, B-D UF III MINI PEN NEEDLES, OneTouch Verio, Fluticasone-Salmeterol, levothyroxine, Fluticasone-Salmeterol, Basaglar KwikPen, OneTouch Delica Plus [id number], and insulin lispro.  Pharmacotherapy (Medications  Ordered): No orders of the defined types were placed in this encounter.  Orders:  Orders Placed This Encounter  Procedures  . KNEE INJECTION    Local Anesthetic &  Steroid injection.    Standing Status:   Future    Standing Expiration Date:   12/01/2018    Scheduling Instructions:     Side: Right-sided     Sedation: None     Timeframe: ASAP    Order Specific Question:   Where will this procedure be performed?    Answer:   [facility]  . DG Knee 1-2 Views Right    Standing Status:   Future    Standing Expiration Date:   11/01/2019    Order Specific Question:   Reason for Exam (SYMPTOM  OR DIAGNOSIS REQUIRED)    Answer:   Right knee pain/arthralgia    Order Specific Question:   Preferred imaging location?    Answer:   [facility]    Order Specific Question:   Call Results- Best Contact Number?    Answer:   [phone] (Pain Clinic facility) (Dr. Dossie Arbour)   Follow-up plan:   Return for Procedure (no sedation): (R) Knee inj. #1 (steroid).      Interventional management options:  Considering:   NOTE: IODINE Allergy. Patient initially seen as a "Fast Track"  Palliative right-sided L4-5 LESIs  Diagnostic right-sided intra-articular knee joint injection #1  Possible series of 5 right-sided intra-articular Hyalgan knee injections  Diagnostic bilateral lumbar facet block  Possible bilateral lumbar facet RFA    PRN Procedures:   Palliative right-sided L4-5 LESI #4     Recent Visits Date Type Provider Dept  09/28/18 Procedure visit Milinda Pointer, [facility]  09/20/18 Office Visit Milinda Pointer, MD [facility]  Showing recent visits within past 90 days and meeting all other requirements   Today's Visits Date Type Provider Dept  11/01/18 Office Visit Milinda Pointer, MD [facility]  Showing today's visits and meeting all other requirements   Future Appointments No visits were found meeting these conditions.  Showing  future appointments within next 90 days and meeting all other requirements   I discussed the assessment and treatment plan with the patient. The patient was provided an opportunity to ask questions and all were answered. The patient agreed with the plan and demonstrated an understanding of the instructions.  Patient advised to call back or seek an in-person evaluation if the symptoms or condition worsens.  Total duration of non-face-to-face encounter: 12 minutes.  Note by: Gaspar Cola, MD Date: 11/01/2018; Time: 4:18 PM  Note: This dictation was prepared with Dragon dictation. Any transcriptional errors that may result from this process are unintentional.  Disclaimer:  * Given the special circumstances of the COVID-19 pandemic, the federal government has announced that the Office for Civil Rights (OCR) will exercise its enforcement discretion and will not impose penalties on physicians using telehealth in the event of noncompliance with regulatory requirements under the [place] and [place] (HIPAA) in connection with the good faith provision of telehealth during the IOMBT-59 national public health emergency. ([place])

## 2018-11-01 ENCOUNTER — Other Ambulatory Visit: Payer: Self-pay

## 2018-11-01 ENCOUNTER — Ambulatory Visit: Payer: BC Managed Care – PPO | Attending: Pain Medicine | Admitting: Pain Medicine

## 2018-11-01 DIAGNOSIS — M25561 Pain in right knee: Secondary | ICD-10-CM

## 2018-11-01 DIAGNOSIS — M79604 Pain in right leg: Secondary | ICD-10-CM | POA: Diagnosis not present

## 2018-11-01 DIAGNOSIS — M9901 Segmental and somatic dysfunction of cervical region: Secondary | ICD-10-CM | POA: Diagnosis not present

## 2018-11-01 DIAGNOSIS — M5441 Lumbago with sciatica, right side: Secondary | ICD-10-CM

## 2018-11-01 DIAGNOSIS — M542 Cervicalgia: Secondary | ICD-10-CM | POA: Diagnosis not present

## 2018-11-01 DIAGNOSIS — G894 Chronic pain syndrome: Secondary | ICD-10-CM

## 2018-11-01 DIAGNOSIS — M4602 Spinal enthesopathy, cervical region: Secondary | ICD-10-CM | POA: Diagnosis not present

## 2018-11-01 DIAGNOSIS — G8929 Other chronic pain: Secondary | ICD-10-CM

## 2018-11-01 DIAGNOSIS — M62830 Muscle spasm of back: Secondary | ICD-10-CM | POA: Diagnosis not present

## 2018-11-01 NOTE — Patient Instructions (Signed)
____________________________________________________________________________________________  Preparing for your procedure (without sedation)  Procedure appointments are limited to planned procedures: . No Prescription Refills. . No disability issues will be discussed. . No medication changes will be discussed.  Instructions: . Oral Intake: Do not eat or drink anything for at least 3 hours prior to your procedure. . Transportation: Unless otherwise stated by your physician, you may drive yourself after the procedure. . Blood Pressure Medicine: Take your blood pressure medicine with a sip of water the morning of the procedure. . Blood thinners: Notify our staff if you are taking any blood thinners. Depending on which one you take, there will be specific instructions on how and when to stop it. . Diabetics on insulin: Notify the staff so that you can be scheduled 1st case in the morning. If your diabetes requires high dose insulin, take only  of your normal insulin dose the morning of the procedure and notify the staff that you have done so. . Preventing infections: Shower with an antibacterial soap the morning of your procedure.  . Build-up your immune system: Take 1000 mg of Vitamin C with every meal (3 times a day) the day prior to your procedure. . Antibiotics: Inform the staff if you have a condition or reason that requires you to take antibiotics before dental procedures. . Pregnancy: If you are pregnant, call and cancel the procedure. . Sickness: If you have a cold, fever, or any active infections, call and cancel the procedure. . Arrival: You must be in the facility at least 30 minutes prior to your scheduled procedure. . Children: Do not bring any children with you. . Dress appropriately: Bring dark clothing that you would not mind if they get stained. . Valuables: Do not bring any jewelry or valuables.  Reasons to call and reschedule or cancel your procedure: (Following these  recommendations will minimize the risk of a serious complication.) . Surgeries: Avoid having procedures within 2 weeks of any surgery. (Avoid for 2 weeks before or after any surgery). . Flu Shots: Avoid having procedures within 2 weeks of a flu shots or . (Avoid for 2 weeks before or after immunizations). . Barium: Avoid having a procedure within 7-10 days after having had a radiological study involving the use of radiological contrast. (Myelograms, Barium swallow or enema study). . Heart attacks: Avoid any elective procedures or surgeries for the initial 6 months after a "Myocardial Infarction" (Heart Attack). . Blood thinners: It is imperative that you stop these medications before procedures. Let us know if you if you take any blood thinner.  . Infection: Avoid procedures during or within two weeks of an infection (including chest colds or gastrointestinal problems). Symptoms associated with infections include: Localized redness, fever, chills, night sweats or profuse sweating, burning sensation when voiding, cough, congestion, stuffiness, runny nose, sore throat, diarrhea, nausea, vomiting, cold or Flu symptoms, recent or current infections. It is specially important if the infection is over the area that we intend to treat. . Heart and lung problems: Symptoms that may suggest an active cardiopulmonary problem include: cough, chest pain, breathing difficulties or shortness of breath, dizziness, ankle swelling, uncontrolled high or unusually low blood pressure, and/or palpitations. If you are experiencing any of these symptoms, cancel your procedure and contact your primary care physician for an evaluation.  Remember:  Regular Business hours are:  Monday to Thursday 8:00 AM to 4:00 PM  Provider's Schedule: Jamael Hoffmann, MD:  Procedure days: Tuesday and Thursday 7:30 AM to 4:00 PM  Bilal   Lateef, MD:  Procedure days: Monday and Wednesday 7:30 AM to 4:00  PM ____________________________________________________________________________________________    

## 2018-11-03 ENCOUNTER — Ambulatory Visit
Admission: RE | Admit: 2018-11-03 | Discharge: 2018-11-03 | Disposition: A | Discharge status: Home / Self Care | Payer: BC Managed Care – PPO | Source: Ambulatory Visit | Attending: Pain Medicine | Admitting: Pain Medicine

## 2018-11-03 DIAGNOSIS — M25561 Pain in right knee: Secondary | ICD-10-CM | POA: Diagnosis not present

## 2018-11-03 DIAGNOSIS — M1711 Unilateral primary osteoarthritis, right knee: Secondary | ICD-10-CM | POA: Diagnosis not present

## 2018-11-03 DIAGNOSIS — M542 Cervicalgia: Secondary | ICD-10-CM | POA: Diagnosis not present

## 2018-11-03 DIAGNOSIS — M62830 Muscle spasm of back: Secondary | ICD-10-CM | POA: Diagnosis not present

## 2018-11-03 DIAGNOSIS — M4602 Spinal enthesopathy, cervical region: Secondary | ICD-10-CM | POA: Diagnosis not present

## 2018-11-03 DIAGNOSIS — G8929 Other chronic pain: Secondary | ICD-10-CM | POA: Insufficient documentation

## 2018-11-03 DIAGNOSIS — M9901 Segmental and somatic dysfunction of cervical region: Secondary | ICD-10-CM | POA: Diagnosis not present

## 2018-11-04 DIAGNOSIS — E119 Type 2 diabetes mellitus without complications: Secondary | ICD-10-CM | POA: Diagnosis not present

## 2018-11-04 DIAGNOSIS — Z79899 Other long term (current) drug therapy: Secondary | ICD-10-CM | POA: Diagnosis not present

## 2018-11-04 DIAGNOSIS — K1120 Sialoadenitis, unspecified: Secondary | ICD-10-CM | POA: Diagnosis not present

## 2018-11-04 DIAGNOSIS — R6884 Jaw pain: Secondary | ICD-10-CM | POA: Diagnosis not present

## 2018-11-04 DIAGNOSIS — K047 Periapical abscess without sinus: Secondary | ICD-10-CM | POA: Diagnosis not present

## 2018-11-04 DIAGNOSIS — Z7984 Long term (current) use of oral hypoglycemic drugs: Secondary | ICD-10-CM | POA: Diagnosis not present

## 2018-11-04 DIAGNOSIS — M542 Cervicalgia: Secondary | ICD-10-CM | POA: Diagnosis not present

## 2018-11-05 ENCOUNTER — Telehealth: Payer: Self-pay | Admitting: Pulmonary Disease

## 2018-11-05 NOTE — Telephone Encounter (Signed)
Called patient for COVID-19 pre-screening for in office visit. ° °Have you recently traveled any where out of the local area in the last 2 weeks? No ° °Have you been in close contact with a person diagnosed with COVID-19 or someone awaiting results within the last 2 weeks? No ° °Do you currently have any of the following symptoms? If so, when did they start? °Cough     Diarrhea   Joint Pain °Fever      Muscle Pain   Red eyes °Shortness of breath   Abdominal pain  Vomiting °Loss of smell    Rash    Sore Throat °Headache    Weakness   Bruising or bleeding ° ° °Okay to proceed with visit 11/08/2018   ° ° °

## 2018-11-08 ENCOUNTER — Encounter: Payer: Self-pay | Admitting: Pulmonary Disease

## 2018-11-08 ENCOUNTER — Ambulatory Visit (INDEPENDENT_AMBULATORY_CARE_PROVIDER_SITE_OTHER): Payer: BC Managed Care – PPO | Admitting: Pulmonary Disease

## 2018-11-08 ENCOUNTER — Other Ambulatory Visit
Admission: RE | Admit: 2018-11-08 | Discharge: 2018-11-08 | Disposition: A | Discharge status: Home / Self Care | Payer: BC Managed Care – PPO | Source: Ambulatory Visit | Attending: Pulmonary Disease | Admitting: Pulmonary Disease

## 2018-11-08 ENCOUNTER — Other Ambulatory Visit: Payer: Self-pay

## 2018-11-08 VITALS — BP 124/64 | HR 77 | Temp 97.1°F | Ht 65.0 in | Wt 197.0 lb

## 2018-11-08 DIAGNOSIS — J4540 Moderate persistent asthma, uncomplicated: Secondary | ICD-10-CM

## 2018-11-08 LAB — CBC WITH DIFFERENTIAL/PLATELET
Abs Immature Granulocytes: 0.02 10*3/uL (ref 0.00–0.07)
Basophils Absolute: 0 10*3/uL (ref 0.0–0.1)
Basophils Relative: 1 %
Eosinophils Absolute: 0.2 10*3/uL (ref 0.0–0.5)
Eosinophils Relative: 3 %
HCT: 42 % (ref 36.0–46.0)
Hemoglobin: 13.6 g/dL (ref 12.0–15.0)
Immature Granulocytes: 0 %
Lymphocytes Relative: 17 %
Lymphs Abs: 1.4 10*3/uL (ref 0.7–4.0)
MCH: 28.4 pg (ref 26.0–34.0)
MCHC: 32.4 g/dL (ref 30.0–36.0)
MCV: 87.7 fL (ref 80.0–100.0)
Monocytes Absolute: 0.5 10*3/uL (ref 0.1–1.0)
Monocytes Relative: 6 %
Neutro Abs: 6.3 10*3/uL (ref 1.7–7.7)
Neutrophils Relative %: 73 %
Platelets: 243 10*3/uL (ref 150–400)
RBC: 4.79 MIL/uL (ref 3.87–5.11)
RDW: 13.2 % (ref 11.5–15.5)
WBC: 8.6 10*3/uL (ref 4.0–10.5)
nRBC: 0 % (ref 0.0–0.2)

## 2018-11-08 NOTE — Progress Notes (Signed)
PULMONARY OFFICE FOLLOW-UP NOTE  Requesting MD/Service: Alma Friendly, NP Date of initial consultation: 10/20/18 Reason for consultation: Poorly controlled asthma  PT PROFILE: 73 y.o. female with minimal smoking history (smoked briefly as a teenager) who was diagnosed with asthma at age 56 while living in [place].  Historically, her asthma has been mild intermittent and she has not required controller medication.  Moved to [facility] 2016.  Over the past year, has noted increased asthma symptoms, particularly bad this summer.  Started back on Advair three weeks prior to this evaluation.  DATA:     INTERVAL: Initial consultation was performed remotely on 10/20/2018.  At that time Advair dose was increased from 250 to 500 strength due to intolerance to systemic steroids (severe hyperglycemia).    SUBJ:  With the increase in ICS strength, recent mild asthma exacerbation has resolved. She feels back to her baseline and rarely requires albuterol rescue inhaler. She reports increased hoarseness which has been a persistent problem since initiating ICS and was a noted side effect previously (in [place]) when she was on ICS. Otherwise, she has no new complaints and feels asthma symptoms are well controlled. She plans a prolonged trip to [place] - leaving in one month and returning mid-December.   She denies CP, fever, purulent sputum, hemoptysis, LE edema and calf tenderness.     OBJ: Vitals:   11/08/18 1514  BP: 124/64  Pulse: 77  Temp: (!) 97.1 F (36.2 C)  TempSrc: Temporal  SpO2: 99%  Weight: 197 lb (89.4 kg)  Height: 5\' 5"  (1.651 m)  RA  EXAM:  Gen: NAD HEENT: NCAT, sclerae white Neck: No JVD Lungs: breath sounds full, no wheezes or other adventitious sounds Cardiovascular: RRR, no murmurs Abdomen: Soft, nontender, normal BS Ext: without clubbing, cyanosis, edema Neuro: grossly intact Skin: Limited exam, no lesions noted   DATA:   BMP Latest Ref Rng & Units 10/13/2018  08/11/2018 12/09/2017  Glucose 70 - 99 mg/dL 333(H) - 316(H)  BUN 6 - 23 mg/dL 15 - 13  Creatinine 0.40 - 1.20 mg/dL 0.75 0.50 0.87  Sodium 135 - 145 mEq/L 135 - 132(L)  Potassium 3.5 - 5.1 mEq/L 4.4 - 4.4  Chloride 96 - 112 mEq/L 98 - 95(L)  CO2 19 - 32 mEq/L 30 - 29  Calcium 8.4 - 10.5 mg/dL 9.7 - 9.9    CBC Latest Ref Rng & Units 11/08/2018 10/13/2018 08/06/2017  WBC 4.0 - 10.5 K/uL 8.6 5.4 4.8  Hemoglobin 12.0 - 15.0 g/dL 13.6 14.5 14.4  Hematocrit 36.0 - 46.0 % 42.0 43.9 45.8  Platelets 150 - 400 K/uL 243 233.0 230    CXR: No new film  I have personally reviewed all chest radiographs reported above including CXRs and CT chest unless otherwise indicated  IMPRESSION:     ICD-10-CM   1. Moderate persistent asthma without complication  123456 CBC with Differential/Platelet    IgE    Pulmonary Function Test [facility] Only  2) Hoarseness due to inhaled corticosteroids   PLAN:  Blood test today: CBC with differential, IgE level  Cont Advair (or Wixela) 250/50, 1 inhalation twice a day.  Rinse mouth after use  Continue Singulair 10 mg daily  Continue albuterol as needed for increased shortness of breath, wheezing, chest tightness, cough  For increased allergy or asthma symptoms, recommended that she may try Zyrtec (cetirizine) 10 mg daily.  Best taken at bedtime  Follow-up in 4-5 months with Dr Mortimer Fries and with PFTs prior    Shanon Brow  Alva Garnet, MD PCCM service Mobile [phone] Pager [phone] 11/09/2018 10:38 AM

## 2018-11-08 NOTE — Patient Instructions (Addendum)
Blood test today: CBC with differential, IgE level  Cont Advair (or Wixela) 250/50, 1 inhalation twice a day.  Rinse mouth after use  Continue Singulair 10 mg daily  Continue albuterol as needed for increased shortness of breath, wheezing, chest tightness, cough  For increased allergy or asthma symptoms, you may try Zyrtec (cetirizine) 10 mg daily.  Best taken at bedtime  Follow-up in 4-5 months with PFTs prior

## 2018-11-09 ENCOUNTER — Other Ambulatory Visit: Payer: Self-pay | Admitting: Primary Care

## 2018-11-09 ENCOUNTER — Telehealth: Payer: Self-pay | Admitting: Primary Care

## 2018-11-09 DIAGNOSIS — M62830 Muscle spasm of back: Secondary | ICD-10-CM | POA: Diagnosis not present

## 2018-11-09 DIAGNOSIS — K1122 Acute recurrent sialoadenitis: Secondary | ICD-10-CM | POA: Diagnosis not present

## 2018-11-09 DIAGNOSIS — Z794 Long term (current) use of insulin: Secondary | ICD-10-CM

## 2018-11-09 DIAGNOSIS — M9901 Segmental and somatic dysfunction of cervical region: Secondary | ICD-10-CM | POA: Diagnosis not present

## 2018-11-09 DIAGNOSIS — M4602 Spinal enthesopathy, cervical region: Secondary | ICD-10-CM | POA: Diagnosis not present

## 2018-11-09 DIAGNOSIS — M542 Cervicalgia: Secondary | ICD-10-CM | POA: Diagnosis not present

## 2018-11-09 DIAGNOSIS — E119 Type 2 diabetes mellitus without complications: Secondary | ICD-10-CM

## 2018-11-09 NOTE — Telephone Encounter (Signed)
Sandy @ [facility] with Dr Jamesetta [place] office called Wanting to get clearamce to put pt on prednisone for facial swelling  [place] phone # [phone]

## 2018-11-09 NOTE — Telephone Encounter (Signed)
Please notify Lovey Newcomer that that should be okay as long as it's a short course and patient monitors her glucose readings carefully.  Please notify patient that we will have her continue her current regimen, monitor her glucose readings and notify me if she sees readings consistently above 300. How are her readings now?

## 2018-11-09 NOTE — Telephone Encounter (Signed)
Patient called about message.  She stated that she would also like to know if this is okay what she should increase her insulin to.  C/B  # [phone]

## 2018-11-10 LAB — IGE: IgE (Immunoglobulin E), Serum: 37 IU/mL (ref 6–495)

## 2018-11-10 NOTE — Telephone Encounter (Signed)
Sandy returned your call. She stated when you call back ask the operator to go back and get her so she can get the patient her medication  [phone]

## 2018-11-10 NOTE — Telephone Encounter (Signed)
Spoken and notified patient of Tawni Millers comments. Patient stated that in morning BS is running 93, 90, 140 and in the evening 140, 220, and 260.

## 2018-11-10 NOTE — Telephone Encounter (Signed)
Molly Wall have been notified of Molly Wall comments.

## 2018-11-10 NOTE — Telephone Encounter (Signed)
Noted  

## 2018-11-16 ENCOUNTER — Other Ambulatory Visit: Payer: Self-pay | Admitting: Pulmonary Disease

## 2018-11-18 DIAGNOSIS — M62830 Muscle spasm of back: Secondary | ICD-10-CM | POA: Diagnosis not present

## 2018-11-18 DIAGNOSIS — M4602 Spinal enthesopathy, cervical region: Secondary | ICD-10-CM | POA: Diagnosis not present

## 2018-11-18 DIAGNOSIS — M9901 Segmental and somatic dysfunction of cervical region: Secondary | ICD-10-CM | POA: Diagnosis not present

## 2018-11-18 DIAGNOSIS — M542 Cervicalgia: Secondary | ICD-10-CM | POA: Diagnosis not present

## 2018-11-29 ENCOUNTER — Telehealth: Payer: Self-pay | Admitting: *Deleted

## 2018-11-29 ENCOUNTER — Other Ambulatory Visit: Payer: Self-pay | Admitting: Primary Care

## 2018-11-29 DIAGNOSIS — E119 Type 2 diabetes mellitus without complications: Secondary | ICD-10-CM

## 2018-11-29 DIAGNOSIS — Z794 Long term (current) use of insulin: Secondary | ICD-10-CM

## 2018-11-29 NOTE — Telephone Encounter (Signed)
Patient states her pain in her right knee has gotten some better and she would like to hold off on the right knee injection for now. She will let us know if she decides she needs it. Her trip to [place] also got cancelled and she does not fell as much urgency to get it done now.

## 2018-11-29 NOTE — Telephone Encounter (Signed)
Noted, change submitted as requested.

## 2018-11-29 NOTE — Telephone Encounter (Signed)
Per pharmacy  Alternative Requested:PT IS ON NEW PLAN AND BASAGLAR IS NO LONGER COVERED. PLEASE SEND ALTERNATE THERAPY AS APPROPRIATE.

## 2018-11-30 ENCOUNTER — Ambulatory Visit: Payer: BC Managed Care – PPO | Admitting: Pain Medicine

## 2018-12-01 ENCOUNTER — Encounter: Payer: Self-pay | Admitting: Primary Care

## 2018-12-01 ENCOUNTER — Ambulatory Visit (INDEPENDENT_AMBULATORY_CARE_PROVIDER_SITE_OTHER): Payer: BC Managed Care – PPO | Admitting: Primary Care

## 2018-12-01 ENCOUNTER — Other Ambulatory Visit: Payer: Self-pay

## 2018-12-01 VITALS — BP 122/66 | HR 80 | Temp 97.9°F | Ht 65.0 in | Wt 199.5 lb

## 2018-12-01 DIAGNOSIS — Z794 Long term (current) use of insulin: Secondary | ICD-10-CM

## 2018-12-01 DIAGNOSIS — E119 Type 2 diabetes mellitus without complications: Secondary | ICD-10-CM | POA: Diagnosis not present

## 2018-12-01 DIAGNOSIS — Z23 Encounter for immunization: Secondary | ICD-10-CM | POA: Diagnosis not present

## 2018-12-01 NOTE — Addendum Note (Signed)
Addended by: Jacqualin Combes on: 12/01/2018 04:13 PM   Modules accepted: Orders

## 2018-12-01 NOTE — Assessment & Plan Note (Signed)
Has had a steroid injection and oral prednisone since last visit. As expected glucose readings have been all over the place but overall better.  She has now completed her steroids so I'd like to continue her current regimen and have her monitor glucose readings until next A1C that is due in November. She agrees and will notify if glucose readings start to increase.  Follow up in November 2020.

## 2018-12-01 NOTE — Progress Notes (Signed)
Subjective:    Patient ID: Molly Wall, female    DOB: 1945-07-03, 73 y.o.   MRN: [id number]  HPI  Molly Wall is a 73 year old female who presents today for follow up of diabetes.   Current medications include: Lantus 45 units, Humalog 10 units TID with meals, Metformin ER 1000 mg daily.  She is checking her blood glucose 3 times daily and is getting readings of:  Before breakfast: low 100's to mid 200's Before lunch: low 100's to high 100's Before dinner: mid 200's to mid 300's  She has recently undergone cortisone injection and also been on a course of oral prednisone.   Last A1C: 9.6 in August 2020, 8.2 in May 2020 Last Eye Exam: Due in Fall 2020 Last Foot Exam: Completed in May 2020 Pneumonia Vaccination: Completed in 2016 ACE/ARB: None. Urine microalbumin negative in February 2020. Statin: atorvastatin   BP Readings from Last 3 Encounters:  12/01/18 122/66  11/08/18 124/64  10/27/18 114/64   She endorses a healthy diet which mostly includes lean protein, vegetables, little starch. She is snacking on nuts. Infrequent sweets/desserts.   Review of Systems  Eyes: Negative for visual disturbance.  Respiratory: Negative for shortness of breath.   Cardiovascular: Negative for chest pain.  Neurological:       Some dizziness with blood glucose readings in 90's.       Past Medical History:  Diagnosis Date  . Allergic rhinitis   . Allergy   . Asthma   . Atypical ductal hyperplasia of right breast   . 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 Breast cancer ([place])   . Diabetes mellitus without complication ([facility])   . Family history of breast cancer   . Family history of colon cancer   . Hypercholesteremia   . Hypothyroidism   . Lumbar herniated disc   . OSA (obstructive sleep apnea)    used to use CPAP, not any more over 1 1/2 yrs  . Salivary stone    left side  . Sleep apnea    no cpap in 2 yrs   . Vitamin D deficiency      Social History    Socioeconomic History  . Marital status: Divorced    Spouse name: Not on file  . Number of children: 2  . Years of education: Not on file  . Highest education level: Not on file  Occupational History  . Not on file  Social Needs  . Financial resource strain: Not on file  . Food insecurity    Worry: Not on file    Inability: Not on file  . Transportation needs    Medical: Not on file    Non-medical: Not on file  Tobacco Use  . Smoking status: Former Smoker    Types: Cigarettes  . Smokeless tobacco: Never Used  Substance and Sexual Activity  . Alcohol use: Yes    Alcohol/week: 0.0 - 1.0 standard drinks    Comment: 0-1 per month  . Drug use: No  . Sexual activity: Not Currently  Lifestyle  . Physical activity    Days per week: Not on file    Minutes per session: Not on file  . Stress: Not on file  Relationships  . Social Herbalist on phone: Not on file    Gets together: Not on file    Attends religious service: Not on file    Active member of club or organization: Not on  file    Attends meetings of clubs or organizations: Not on file    Relationship status: Not on file  . Intimate partner violence    Fear of current or ex partner: Not on file    Emotionally abused: Not on file    Physically abused: Not on file    Forced sexual activity: Not on file  Other Topics Concern  . Not on file  Social History Narrative   Single.   Moved from [place] to [place] several weeks ago.   Family lives in [place].   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    Past Surgical History:  Procedure Laterality Date  . APPENDECTOMY    . BREAST LUMPECTOMY WITH RADIOACTIVE SEED LOCALIZATION Right 08/12/2017   Procedure: RIGHT BREAST LUMPECTOMY X'S 2 WITH RADIOACTIVE SEED LOCALIZATION X'S 2;  Surgeon: Fanny Skates, MD;  Location: [facility];  Service: General;  Laterality: Right;   . COLONOSCOPY     11 yrs ago in [place] but given a 10 yr recall per pt   . GANGLION CYST EXCISION     73 years old  . INCISION AND DRAINAGE ABSCESS ANAL    . KNEE SURGERY  2014  . SHOULDER SURGERY  1976    Family History  Problem Relation Age of Onset  . Alzheimer's disease Mother   . Asthma Mother   . Dementia Mother        cause of death  . Arthritis Father   . Heart attack Father 15       cause of death  . Breast cancer Sister 12  . Colon cancer Paternal Grandmother   . Liver cancer Maternal Uncle   . Colon polyps Neg Hx   . Rectal cancer Neg Hx   . Stomach cancer Neg Hx     Allergies  Allergen Reactions  . 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 She described it was a small amount in a milk carton in [place]. She states they stopped using it shortly after because the trial wasn't successful. SPM    Current Outpatient Medications on File Prior to Visit  Medication Sig Dispense Refill  . albuterol (PROAIR HFA) 108 (90 Base) MCG/ACT inhaler Inhale 2 puffs into the lungs every 6 (six) hours as needed for wheezing or shortness of breath. NEED APPOINTMENT FOR ANY MORE REFILLS 18 g 0  . anastrozole (ARIMIDEX) 1 MG tablet Take 1 tablet (1 mg total) by mouth daily. 90 tablet 4  . atorvastatin (LIPITOR) 20 MG tablet Take 1 tablet by mouth daily for cholesterol. 90 tablet 3  . B-D UF III MINI PEN NEEDLES 31G X 5 MM MISC USE AS DIRECTED USE DAILY TO ADMINISTER VICTOZA 100 each 5  . fluticasone (FLONASE) 50 MCG/ACT nasal spray Place 2 sprays into both nostrils daily. 48 g 1  . Insulin Glargine (LANTUS SOLOSTAR) 100 UNIT/ML Solostar Pen Inject 45 Units into the skin daily. 15 mL 2  . insulin lispro (HUMALOG KWIKPEN) 100 UNIT/ML KwikPen Inject 0.1 mLs (10 Units total) into the skin 3 (three) times daily with meals as needed. 15 mL 2  . Lancets (ONETOUCH DELICA PLUS 123XX123) MISC INJECT 1  DEVICE INTO THE SKIN 3 (THREE) TIMES DAILY. TO CHECK BLOOD SUGAR 100 each 2  . levothyroxine (SYNTHROID) 88 MCG tablet Take 1 tablet  by mouth every morning on an empty stomach with water only.  No food or other medications for 30 minutes. 90 tablet 0  . metformin (FORTAMET) 1000 MG (OSM) 24 hr tablet Take 1 tablet (1,000 mg total) by mouth daily with breakfast. 90 tablet 3  . montelukast (SINGULAIR) 10 MG tablet Take 1 tablet (10 mg total) by mouth at bedtime. For allergies and asthma. 90 tablet 3  . ONETOUCH VERIO test strip USE AS DIRECTED TO CHECK FASTING BLOOD SUGARS THREE TIMES DAILY. 100 each 3  . WIXELA INHUB 500-50 MCG/DOSE AEPB INHALE 1 PUFF INTO THE LUNGS 2 (TWO) TIMES DAILY. 60 each 0  . [DISCONTINUED] insulin aspart (NOVOLOG FLEXPEN) 100 UNIT/ML FlexPen Inject 8 Units into the skin 3 (three) times daily with meals. For blood sugars greater than 100. 15 mL 2   No current facility-administered medications on file prior to visit.     BP 122/66   Pulse 80   Temp 97.9 F (36.6 C) (Temporal)   Ht 5\' 5"  (1.651 m)   Wt 199 lb 8 oz (90.5 kg)   SpO2 98%   BMI 33.20 kg/m    Objective:   Physical Exam  Constitutional: She appears well-nourished.  Neck: Neck supple.  Cardiovascular: Normal rate and regular rhythm.  Respiratory: Effort normal and breath sounds normal.  Skin: Skin is warm and dry.           Assessment & Plan:

## 2018-12-01 NOTE — Patient Instructions (Addendum)
Continue Lantus 45 units once daily. Continue Humalog 10 units three times daily with meals.  Continue to eat a balanced/healthy diet.  Ensure you are consuming 64 ounces of water daily.  Schedule a follow up visit for on or after November 12 for diabetes check.  It was a pleasure to see you today!

## 2018-12-02 MED ORDER — FLUTICASONE-SALMETEROL 250-50 MCG/DOSE IN AEPB: 1.0000 | INHALATION_SPRAY | Freq: Two times a day (BID) | RESPIRATORY_TRACT | 5 refills | Status: DC

## 2018-12-03 DIAGNOSIS — G4733 Obstructive sleep apnea (adult) (pediatric): Secondary | ICD-10-CM

## 2018-12-07 DIAGNOSIS — M9903 Segmental and somatic dysfunction of lumbar region: Secondary | ICD-10-CM | POA: Diagnosis not present

## 2018-12-07 DIAGNOSIS — K1123 Chronic sialoadenitis: Secondary | ICD-10-CM | POA: Diagnosis not present

## 2018-12-07 DIAGNOSIS — M62830 Muscle spasm of back: Secondary | ICD-10-CM | POA: Diagnosis not present

## 2018-12-07 DIAGNOSIS — M9901 Segmental and somatic dysfunction of cervical region: Secondary | ICD-10-CM | POA: Diagnosis not present

## 2018-12-07 DIAGNOSIS — M4602 Spinal enthesopathy, cervical region: Secondary | ICD-10-CM | POA: Diagnosis not present

## 2018-12-07 DIAGNOSIS — M9902 Segmental and somatic dysfunction of thoracic region: Secondary | ICD-10-CM | POA: Diagnosis not present

## 2018-12-07 DIAGNOSIS — M9907 Segmental and somatic dysfunction of upper extremity: Secondary | ICD-10-CM | POA: Diagnosis not present

## 2018-12-07 DIAGNOSIS — M542 Cervicalgia: Secondary | ICD-10-CM | POA: Diagnosis not present

## 2018-12-08 DIAGNOSIS — M62830 Muscle spasm of back: Secondary | ICD-10-CM | POA: Diagnosis not present

## 2018-12-08 DIAGNOSIS — M542 Cervicalgia: Secondary | ICD-10-CM | POA: Diagnosis not present

## 2018-12-08 DIAGNOSIS — M4602 Spinal enthesopathy, cervical region: Secondary | ICD-10-CM | POA: Diagnosis not present

## 2018-12-08 DIAGNOSIS — M9901 Segmental and somatic dysfunction of cervical region: Secondary | ICD-10-CM | POA: Diagnosis not present

## 2018-12-10 DIAGNOSIS — M542 Cervicalgia: Secondary | ICD-10-CM | POA: Diagnosis not present

## 2018-12-10 DIAGNOSIS — M4602 Spinal enthesopathy, cervical region: Secondary | ICD-10-CM | POA: Diagnosis not present

## 2018-12-10 DIAGNOSIS — M9901 Segmental and somatic dysfunction of cervical region: Secondary | ICD-10-CM | POA: Diagnosis not present

## 2018-12-10 DIAGNOSIS — M62830 Muscle spasm of back: Secondary | ICD-10-CM | POA: Diagnosis not present

## 2018-12-13 DIAGNOSIS — M542 Cervicalgia: Secondary | ICD-10-CM | POA: Diagnosis not present

## 2018-12-13 DIAGNOSIS — M9901 Segmental and somatic dysfunction of cervical region: Secondary | ICD-10-CM | POA: Diagnosis not present

## 2018-12-13 DIAGNOSIS — M4602 Spinal enthesopathy, cervical region: Secondary | ICD-10-CM | POA: Diagnosis not present

## 2018-12-13 DIAGNOSIS — M62830 Muscle spasm of back: Secondary | ICD-10-CM | POA: Diagnosis not present

## 2018-12-15 ENCOUNTER — Other Ambulatory Visit: Payer: Self-pay | Admitting: Primary Care

## 2018-12-15 ENCOUNTER — Other Ambulatory Visit: Payer: Self-pay | Admitting: Pulmonary Disease

## 2018-12-15 DIAGNOSIS — Z794 Long term (current) use of insulin: Secondary | ICD-10-CM

## 2018-12-15 DIAGNOSIS — E119 Type 2 diabetes mellitus without complications: Secondary | ICD-10-CM

## 2018-12-16 DIAGNOSIS — M542 Cervicalgia: Secondary | ICD-10-CM | POA: Diagnosis not present

## 2018-12-16 DIAGNOSIS — M9901 Segmental and somatic dysfunction of cervical region: Secondary | ICD-10-CM | POA: Diagnosis not present

## 2018-12-16 DIAGNOSIS — M62830 Muscle spasm of back: Secondary | ICD-10-CM | POA: Diagnosis not present

## 2018-12-16 DIAGNOSIS — M4602 Spinal enthesopathy, cervical region: Secondary | ICD-10-CM | POA: Diagnosis not present

## 2018-12-22 DIAGNOSIS — M545 Low back pain: Secondary | ICD-10-CM | POA: Diagnosis not present

## 2018-12-22 DIAGNOSIS — M5416 Radiculopathy, lumbar region: Secondary | ICD-10-CM | POA: Diagnosis not present

## 2018-12-22 DIAGNOSIS — M5412 Radiculopathy, cervical region: Secondary | ICD-10-CM | POA: Diagnosis not present

## 2018-12-22 DIAGNOSIS — M542 Cervicalgia: Secondary | ICD-10-CM | POA: Diagnosis not present

## 2018-12-23 DIAGNOSIS — E119 Type 2 diabetes mellitus without complications: Secondary | ICD-10-CM

## 2018-12-23 DIAGNOSIS — M545 Low back pain: Secondary | ICD-10-CM | POA: Diagnosis not present

## 2018-12-23 DIAGNOSIS — E039 Hypothyroidism, unspecified: Secondary | ICD-10-CM

## 2018-12-23 DIAGNOSIS — M542 Cervicalgia: Secondary | ICD-10-CM | POA: Diagnosis not present

## 2018-12-23 DIAGNOSIS — R252 Cramp and spasm: Secondary | ICD-10-CM

## 2018-12-23 DIAGNOSIS — Z794 Long term (current) use of insulin: Secondary | ICD-10-CM

## 2018-12-23 DIAGNOSIS — M5412 Radiculopathy, cervical region: Secondary | ICD-10-CM | POA: Diagnosis not present

## 2018-12-23 DIAGNOSIS — M5416 Radiculopathy, lumbar region: Secondary | ICD-10-CM | POA: Diagnosis not present

## 2018-12-24 MED ORDER — INSULIN LISPRO (1 UNIT DIAL) 100 UNIT/ML (KWIKPEN): 10.0000 [IU] | PEN_INJECTOR | Freq: Three times a day (TID) | SUBCUTANEOUS | 2 refills | Status: DC | PRN

## 2018-12-24 NOTE — Telephone Encounter (Signed)
I just resent the Humalog, not sure what's going on. You may have to call if she continues to have problems.

## 2018-12-27 DIAGNOSIS — M5412 Radiculopathy, cervical region: Secondary | ICD-10-CM | POA: Diagnosis not present

## 2018-12-27 DIAGNOSIS — M5416 Radiculopathy, lumbar region: Secondary | ICD-10-CM | POA: Diagnosis not present

## 2018-12-27 DIAGNOSIS — M545 Low back pain: Secondary | ICD-10-CM | POA: Diagnosis not present

## 2018-12-27 DIAGNOSIS — M542 Cervicalgia: Secondary | ICD-10-CM | POA: Diagnosis not present

## 2018-12-29 ENCOUNTER — Ambulatory Visit (INDEPENDENT_AMBULATORY_CARE_PROVIDER_SITE_OTHER): Payer: BC Managed Care – PPO | Admitting: Internal Medicine

## 2018-12-29 ENCOUNTER — Other Ambulatory Visit: Payer: Self-pay

## 2018-12-29 ENCOUNTER — Encounter: Payer: Self-pay | Admitting: Internal Medicine

## 2018-12-29 VITALS — BP 118/78 | HR 86 | Temp 97.3°F | Ht 65.0 in | Wt 204.0 lb

## 2018-12-29 DIAGNOSIS — M542 Cervicalgia: Secondary | ICD-10-CM | POA: Diagnosis not present

## 2018-12-29 DIAGNOSIS — M5412 Radiculopathy, cervical region: Secondary | ICD-10-CM | POA: Diagnosis not present

## 2018-12-29 DIAGNOSIS — G4719 Other hypersomnia: Secondary | ICD-10-CM | POA: Diagnosis not present

## 2018-12-29 DIAGNOSIS — M5416 Radiculopathy, lumbar region: Secondary | ICD-10-CM | POA: Diagnosis not present

## 2018-12-29 DIAGNOSIS — J4520 Mild intermittent asthma, uncomplicated: Secondary | ICD-10-CM

## 2018-12-29 DIAGNOSIS — M545 Low back pain: Secondary | ICD-10-CM | POA: Diagnosis not present

## 2018-12-29 NOTE — Progress Notes (Signed)
PULMONARY OFFICE FOLLOW-UP NOTE  Requesting MD/Service: Alma Friendly, NP   PT PROFILE: 73 y.o. female with minimal smoking history (smoked briefly as a teenager) who was diagnosed with asthma at age 44 while living in [place].  Historically, her asthma has been mild intermittent and she has not required controller medication.  Moved to [facility] 2016.  Over the past year, has noted increased asthma symptoms, particularly bad this summer.  Started back on Advair three weeks prior to this evaluation.  INTERVAL: Initial consultation was performed remotely on 10/20/2018.  At that time Advair dose was increased from 250 to 500 strength due to intolerance to systemic steroids (severe hyperglycemia).   CC Follow-up asthma Symptoms of excessive daytime sleepiness History of sleep apnea in the past  HPI  Asthma seems to be under control at this time Continue to use inhalers as prescribed She uses albuterol as needed  No asthma exacerbation at this time No evidence of heart failure at this time No evidence or signs of infection at this time No respiratory distress No fevers, chills, nausea, vomiting, diarrhea No evidence of lower extremity edema No evidence hemoptysis     Patient diagnosed with OSA 15 years ago She stopped wearing her CPAP machine when it broke She moved to [place] 3 years ago She never established care  Patient is seen today for problems and issues with sleep related to excessive daytime sleepiness, persistence of sleep apnea previously Patient  has been having sleep problems for many years Patient has been having excessive daytime sleepiness for a long time Patient has been having extreme fatigue and tiredness, lack of energy +  very Loud snoring every night + struggling breathe at night and gasps for air   Discussed sleep data and reviewed with patient.  Encouraged proper weight management.  Discussed driving precautions and its relationship  with hypersomnolence.  Discussed operating dangerous equipment and its relationship with hypersomnolence.  Discussed sleep hygiene, and benefits of a fixed sleep waked time.  The importance of getting eight or more hours of sleep discussed with patient.  Discussed limiting the use of the computer and television before bedtime.  Decrease naps during the day, so night time sleep will become enhanced.  Limit caffeine, and sleep deprivation.  HTN, stroke, and heart failure are potential risk factors.    EPWORTH SLEEP SCORE 8      Review of Systems:  Gen:  Denies  fever, sweats, chills weight loss  HEENT: Denies blurred vision, double vision, ear pain, eye pain, hearing loss, nose bleeds, sore throat Cardiac:  No dizziness, chest pain or heaviness, chest tightness,edema, No JVD Resp:   No cough, -sputum production, -shortness of breath,-wheezing, -hemoptysis,  Gi: Denies swallowing difficulty, stomach pain, nausea or vomiting, diarrhea, constipation, bowel incontinence Gu:  Denies bladder incontinence, burning urine Ext:   Denies Joint pain, stiffness or swelling Skin: Denies  skin rash, easy bruising or bleeding or hives Endoc:  Denies polyuria, polydipsia , polyphagia or weight change Psych:   Denies depression, insomnia or hallucinations  Other:  All other systems negative  OBJ: Vitals:   12/29/18 1501  BP: 118/78  Pulse: 86  Temp: (!) 97.3 F (36.3 C)  TempSrc: Temporal  SpO2: 97%  Weight: 204 lb (92.5 kg)  Height: 5\' 5"  (1.651 m)  RA    Physical Examination:   GENERAL:NAD, no fevers, chills, no weakness no fatigue HEAD: Normocephalic, atraumatic.  EYES: PERLA, EOMI No scleral icterus.  MOUTH: Moist mucosal membrane.  EAR, NOSE, THROAT: Clear without exudates. No external lesions.  NECK: Supple. No thyromegaly.  No JVD.  PULMONARY: CTA B/L no wheezing, rhonchi, crackles CARDIOVASCULAR: S1 and S2. Regular rate and rhythm. No murmurs GASTROINTESTINAL: Soft,  nontender, nondistended. Positive bowel sounds.  MUSCULOSKELETAL: No swelling, clubbing, or edema.  NEUROLOGIC: No gross focal neurological deficits. 5/5 strength all extremities SKIN: No ulceration, lesions, rashes, or cyanosis.  PSYCHIATRIC: Insight, judgment intact. -depression -anxiety ALL OTHER ROS ARE NEGATIVE       DATA:   BMP Latest Ref Rng & Units 10/13/2018 08/11/2018 12/09/2017  Glucose 70 - 99 mg/dL 333(H) - 316(H)  BUN 6 - 23 mg/dL 15 - 13  Creatinine 0.40 - 1.20 mg/dL 0.75 0.50 0.87  Sodium 135 - 145 mEq/L 135 - 132(L)  Potassium 3.5 - 5.1 mEq/L 4.4 - 4.4  Chloride 96 - 112 mEq/L 98 - 95(L)  CO2 19 - 32 mEq/L 30 - 29  Calcium 8.4 - 10.5 mg/dL 9.7 - 9.9    CBC Latest Ref Rng & Units 11/08/2018 10/13/2018 08/06/2017  WBC 4.0 - 10.5 K/uL 8.6 5.4 4.8  Hemoglobin 12.0 - 15.0 g/dL 13.6 14.5 14.4  Hematocrit 36.0 - 46.0 % 42.0 43.9 45.8  Platelets 150 - 400 K/uL 243 233.0 230    CXR: No new film   Assessment and plan  Asthma mild intermittent Well-controlled at this time with Wixela  250-50 twice daily Patient advised to rinse mouth after every use Continue Singulair daily Albuterol as needed Pulmonary function testing is pending   Excessive daytime sleepiness with signs and symptoms of OSA Patient has a previous diagnosis of sleep apnea in the past and was treated with CPAP At this time patient will need repeat home sleep study for definitive diagnosis and establishing of therapy  Obesity -recommend significant weight loss -recommend changing diet  Deconditioned state -Recommend increased daily activity and exercise       COVID-19 EDUCATION: The signs and symptoms of COVID-19 were discussed with the patient and how to seek care for testing.  The importance of social distancing was discussed today. Hand Washing Techniques and avoid touching face was advised.     MEDICATION ADJUSTMENTS/LABS AND TESTS ORDERED: Continue inhalers as prescribed Avoid  allergens Pulmonary function testing pending Home sleep study ordered   CURRENT MEDICATIONS REVIEWED AT LENGTH WITH PATIENT TODAY   Patient satisfied with Plan of action and management. All questions answered  Follow up in 3 months   Felicity Penix Patricia Pesa, M.D.  [facility]  Medical Director [place] Director [facility] Cardio-Pulmonary Department

## 2018-12-29 NOTE — Patient Instructions (Signed)
Continue inhalers as prescribed Avoid allergens Pulmonary function testing pending Home sleep study ordered    Asthma, Adult  Asthma is a long-term (chronic) condition in which the airways get tight and narrow. The airways are the breathing passages that lead from the nose and mouth down into the lungs. A person with asthma will have times when symptoms get worse. These are called asthma attacks. They can cause coughing, whistling sounds when you breathe (wheezing), shortness of breath, and chest pain. They can make it hard to breathe. There is no cure for asthma, but medicines and lifestyle changes can help control it. There are many things that can bring on an asthma attack or make asthma symptoms worse (triggers). Common triggers include:  Mold.  Dust.  Cigarette smoke.  Cockroaches.  Things that can cause allergy symptoms (allergens). These include animal skin flakes (dander) and pollen from trees or grass.  Things that pollute the air. These may include household cleaners, wood smoke, smog, or chemical odors.  Cold air, weather changes, and wind.  Crying or laughing hard.  Stress.  Certain medicines or drugs.  Certain foods such as dried fruit, potato chips, and grape juice.  Infections, such as a cold or the flu.  Certain medical conditions or diseases.  Exercise or tiring activities. Asthma may be treated with medicines and by staying away from the things that cause asthma attacks. Types of medicines may include:  Controller medicines. These help prevent asthma symptoms. They are usually taken every day.  Fast-acting reliever or rescue medicines. These quickly relieve asthma symptoms. They are used as needed and provide short-term relief.  Allergy medicines if your attacks are brought on by allergens.  Medicines to help control the body's defense (immune) system. Follow these instructions at home: Avoiding triggers in your home  Change your heating and air  conditioning filter often.  Limit your use of fireplaces and wood stoves.  Get rid of pests (such as roaches and mice) and their droppings.  Throw away plants if you see mold on them.  Clean your floors. Dust regularly. Use cleaning products that do not smell.  Have someone vacuum when you are not home. Use a vacuum cleaner with a HEPA filter if possible.  Replace carpet with wood, tile, or vinyl flooring. Carpet can trap animal skin flakes and dust.  Use allergy-proof pillows, mattress covers, and box spring covers.  Wash bed sheets and blankets every week in hot water. Dry them in a dryer.  Keep your bedroom free of any triggers.  Avoid pets and keep windows closed when things that cause allergy symptoms are in the air.  Use blankets that are made of polyester or cotton.  Clean bathrooms and kitchens with bleach. If possible, have someone repaint the walls in these rooms with mold-resistant paint. Keep out of the rooms that are being cleaned and painted.  Wash your hands often with soap and water. If soap and water are not available, use hand sanitizer.  Do not allow anyone to smoke in your home. General instructions  Take over-the-counter and prescription medicines only as told by your doctor. ? Talk with your doctor if you have questions about how or when to take your medicines. ? Make note if you need to use your medicines more often than usual.  Do not use any products that contain nicotine or tobacco, such as cigarettes and e-cigarettes. If you need help quitting, ask your doctor.  Stay away from secondhand smoke.  Avoid doing things  outdoors when allergen counts are high and when air quality is low.  Wear a ski mask when doing outdoor activities in the winter. The mask should cover your nose and mouth. Exercise indoors on cold days if you can.  Warm up before you exercise. Take time to cool down after exercise.  Use a peak flow meter as told by your doctor. A peak  flow meter is a tool that measures how well the lungs are working.  Keep track of the peak flow meter's readings. Write them down.  Follow your asthma action plan. This is a written plan for taking care of your asthma and treating your attacks.  Make sure you get all the shots (vaccines) that your doctor recommends. Ask your doctor about a flu shot and a pneumonia shot.  Keep all follow-up visits as told by your doctor. This is important. Contact a doctor if:  You have wheezing, shortness of breath, or a cough even while taking medicine to prevent attacks.  The mucus you cough up (sputum) is thicker than usual.  The mucus you cough up changes from clear or white to yellow, green, gray, or bloody.  You have problems from the medicine you are taking, such as: ? A rash. ? Itching. ? Swelling. ? Trouble breathing.  You need reliever medicines more than 2-3 times a week.  Your peak flow reading is still at 50-79% of your personal best after following the action plan for 1 hour.  You have a fever. Get help right away if:  You seem to be worse and are not responding to medicine during an asthma attack.  You are short of breath even at rest.  You get short of breath when doing very little activity.  You have trouble eating, drinking, or talking.  You have chest pain or tightness.  You have a fast heartbeat.  Your lips or fingernails start to turn blue.  You are light-headed or dizzy, or you faint.  Your peak flow is less than 50% of your personal best.  You feel too tired to breathe normally. Summary  Asthma is a long-term (chronic) condition in which the airways get tight and narrow. An asthma attack can make it hard to breathe.  Asthma cannot be cured, but medicines and lifestyle changes can help control it.  Make sure you understand how to avoid triggers and how and when to use your medicines. This information is not intended to replace advice given to you by your  health care provider. Make sure you discuss any questions you have with your health care provider. Document Released: 08/20/2007 Document Revised: 05/06/2018 Document Reviewed: 04/07/2016 Elsevier Patient Education  2020 [employer].

## 2018-12-30 ENCOUNTER — Other Ambulatory Visit (INDEPENDENT_AMBULATORY_CARE_PROVIDER_SITE_OTHER): Payer: BC Managed Care – PPO

## 2018-12-30 DIAGNOSIS — M5412 Radiculopathy, cervical region: Secondary | ICD-10-CM | POA: Diagnosis not present

## 2018-12-30 DIAGNOSIS — R252 Cramp and spasm: Secondary | ICD-10-CM

## 2018-12-30 DIAGNOSIS — E039 Hypothyroidism, unspecified: Secondary | ICD-10-CM | POA: Diagnosis not present

## 2018-12-30 DIAGNOSIS — M542 Cervicalgia: Secondary | ICD-10-CM | POA: Diagnosis not present

## 2018-12-30 DIAGNOSIS — M545 Low back pain: Secondary | ICD-10-CM | POA: Diagnosis not present

## 2018-12-30 DIAGNOSIS — M5416 Radiculopathy, lumbar region: Secondary | ICD-10-CM | POA: Diagnosis not present

## 2018-12-31 DIAGNOSIS — E039 Hypothyroidism, unspecified: Secondary | ICD-10-CM

## 2018-12-31 LAB — BASIC METABOLIC PANEL
BUN: 17 mg/dL (ref 6–23)
CO2: 28 mEq/L (ref 19–32)
Calcium: 9.2 mg/dL (ref 8.4–10.5)
Chloride: 99 mEq/L (ref 96–112)
Creatinine, Ser: 0.71 mg/dL (ref 0.40–1.20)
GFR: 81.16 mL/min (ref >60.00)
Glucose, Bld: 231 mg/dL — ABNORMAL HIGH (ref 70–99)
Potassium: 4.1 mEq/L (ref 3.5–5.1)
Sodium: 136 mEq/L (ref 135–145)

## 2018-12-31 LAB — MAGNESIUM: Magnesium: 2 mg/dL (ref 1.5–2.5)

## 2018-12-31 LAB — TSH: TSH: 5.47 u[IU]/mL — ABNORMAL HIGH (ref 0.35–4.50)

## 2018-12-31 MED ORDER — LEVOTHYROXINE SODIUM 88 MCG PO TABS: ORAL_TABLET | ORAL | 0 refills | Status: DC

## 2019-01-03 ENCOUNTER — Other Ambulatory Visit: Payer: Self-pay | Admitting: Primary Care

## 2019-01-03 DIAGNOSIS — M545 Low back pain: Secondary | ICD-10-CM | POA: Diagnosis not present

## 2019-01-03 DIAGNOSIS — M5416 Radiculopathy, lumbar region: Secondary | ICD-10-CM | POA: Diagnosis not present

## 2019-01-03 DIAGNOSIS — M542 Cervicalgia: Secondary | ICD-10-CM | POA: Diagnosis not present

## 2019-01-03 DIAGNOSIS — E785 Hyperlipidemia, unspecified: Secondary | ICD-10-CM

## 2019-01-03 DIAGNOSIS — M5412 Radiculopathy, cervical region: Secondary | ICD-10-CM | POA: Diagnosis not present

## 2019-01-04 DIAGNOSIS — Z794 Long term (current) use of insulin: Secondary | ICD-10-CM

## 2019-01-04 DIAGNOSIS — E119 Type 2 diabetes mellitus without complications: Secondary | ICD-10-CM

## 2019-01-05 ENCOUNTER — Other Ambulatory Visit: Payer: Self-pay | Admitting: *Deleted

## 2019-01-05 DIAGNOSIS — N6091 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: Secondary | ICD-10-CM

## 2019-01-05 NOTE — Progress Notes (Signed)
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   She continues on anastrozole.  She is not having any significant side effects related to the medication and in particular hot flashes and vaginal dryness are not a major issue for her.  Since her last visit, she underwent bilateral breast MRI on 10/26/2018 showing: breast composition B; no evidence of malignancy in either breast; adjacent 4.3 and 1.7 cm masses in the superior aspect of the left lobe of the liver, most likely represent cysts or hemangiomas. Right upper quadrant ultrasound was recommended for the liver masses.  Molly Wall does not have a bone density screening on file.  She also underwent coronavirus testing on 09/24/2018, which was negative/  She does have significant low back pain and underwent lumbar steroid injection on 09/28/2018 under Dr. Dossie Wall for pain management.  This has helped some.  Chest x-ray on 10/13/2018 for shortness of breath was negative for acute illness or malignancy.  Finally, she also underwent right knee x-ray on 11/03/2018 for knee pain, which showed mild multi-compartmental osteoarthritis.   REVIEW OF SYSTEMS: Molly Wall is a bit limited in her activity because of her back and knee  problems.  She likes to do some walking but just going a mile is about more than she can do.  As the results she is concerned about weight gain.  In addition she tells me her hemoglobin A1c is still in the 8.0 range give her take a little.  She tells me her blood sugars are "very reactive", so eating an apple will increase her blood sugar to nearly 300.  She is working closely with her primary doctor regarding this but is frustrated because despite their best efforts she still does not find her blood sugar is appropriately where she would like it.  She recently went to a Molly Wall rally but did wear a mask.  Aside from these issues a detailed review of systems today was noncontributory  HISTORY OF CURRENT ILLNESS: From the original intake note:  Molly Wall had routine screening mammography on 06/18/2017 showing a possible abnormality in the right breast. She underwent unilateral right diagnostic mammography with tomography [facility] on 06/25/2017 showing: breast density category B. There were multiple scattered groups of suspicious fine pleomorphic and amorphous calcifications in the right breast.  Accordingly on 06/26/2017 she proceeded to biopsy of the right breast area in question. The pathology from this procedure showed ([id number]): Atypical ductal hyperplasia in the posterior and anterior regions and Atypical lobular hyperplasia in the posterior brest  She underwent right lumpectomy of these two areas ([id number]) on 08/12/2017 with pathology showing: In the right inferior retroaerolar area and right upper inner quadrant, there was  fibrocystic changes with calcifications with no evidence of malignancy.  The patient's subsequent history is as detailed below.   PAST MEDICAL HISTORY: Past Medical History:  Diagnosis Date   Allergic rhinitis  Allergy    Asthma    Atypical ductal hyperplasia of right breast    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   Breast  cancer ([place])    Diabetes mellitus without complication ([place])    Family history of breast cancer    Family history of colon cancer    Hypercholesteremia    Hypothyroidism    Lumbar herniated disc    OSA (obstructive sleep apnea)    used to use CPAP, not any more over 1 1/2 yrs   Salivary stone    left side   Sleep apnea    no cpap in 2 yrs    Vitamin D deficiency     PAST SURGICAL HISTORY: Past Surgical History:  Procedure Laterality Date   APPENDECTOMY     BREAST LUMPECTOMY WITH RADIOACTIVE SEED LOCALIZATION Right 08/12/2017   Procedure: RIGHT BREAST LUMPECTOMY X'S 2 WITH RADIOACTIVE SEED LOCALIZATION X'S 2;  Surgeon: Molly Skates, MD;  Location: [place];  Service: General;  Laterality: Right;   COLONOSCOPY     11 yrs ago in [place] - polyps but given a 10 yr recall per pt    GANGLION CYST EXCISION     73 years old   [place]  2014   SHOULDER SURGERY  1976    FAMILY HISTORY Family History  Problem Relation Age of Onset   Alzheimer's disease Mother    Asthma Mother    Dementia Mother        cause of death   Arthritis Father    Heart attack Father 69       cause of death   Breast cancer Sister 74   Colon cancer Paternal Grandmother    Liver cancer Maternal Uncle    Colon polyps Neg Hx    Rectal cancer Neg Hx    Stomach cancer Neg Hx    The patient's mother, Molly Wall, died at age 35 in 10/02/2017 with dementia. The patient's father, Molly Wall, died at age 4 due to heart attack and other heart issues. The patient had 1 brother and 2 sisters. One of the patient's sisters died of breast cancer at age 104 and was diagnosed at age 62. There was a maternal uncle who died of liver cancer. There was a paternal grandmother who had colon cancer.    GYNECOLOGIC HISTORY:  No LMP recorded. Patient is postmenopausal. Menarche: 73 years old Age at first live birth: 73 years old She is [place] P2.  Her LMP was around age 47. She took HRT for a few months (less than a year).    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  She is divorced. She lives alone, with no pets. The patient's daughter Molly Wall is age 50 and lives in [place], [place] as a homemaker and home schools her children. Molly Wall has a Conservator, museum/gallery in [employer] in education and domestic violence. The patient's daughter [name] is age 54, lives in [place], and also home schools her children. The patient has 4 grand children.  She attends Advertising account [employer], where her son-in-law is one of the pastors.    ADVANCED DIRECTIVES: in place 12/10/2017, son-in-law Levada Dy. named [place]: Social History   Tobacco Use   Smoking status: Former Smoker    Packs/day: 1.00    Years: 2.00    Pack years:  2.00    Types: Cigarettes   Smokeless tobacco: Never Used   Tobacco comment: quite smoker at age 48  Substance Use Topics   Alcohol use: Yes    Alcohol/week: 0.0 - 1.0 standard drinks    Comment: 0-1 per month   Drug use: No     Colonoscopy: 09/15/2017 / Dr. Loletha Wall / found small/large diverticula and polyp removal.  PAP:   Bone density: remote/ normal  Lipid panel: 12/09/2017, worsened  Allergies  Allergen Reactions   Iodine-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 She described it was a small amount in a milk carton in [place]. She states they stopped using it shortly after because the trial wasn't successful. SPM    Current Outpatient Medications  Medication Sig Dispense Refill   albuterol (PROAIR HFA) 108 (90 Base) MCG/ACT inhaler Inhale 2 puffs into the lungs every 6 (six) hours as needed for wheezing or shortness of breath. NEED APPOINTMENT FOR ANY MORE REFILLS 18 g 0   anastrozole (ARIMIDEX) 1 MG tablet Take 1 tablet (1 mg total)  by mouth daily. 90 tablet 4   atorvastatin (LIPITOR) 20 MG tablet TAKE 1 TABLET BY MOUTH EVERY DAY FOR CHOLESTEROL 30 tablet 1   B-D UF III MINI PEN NEEDLES 31G X 5 MM MISC USE AS DIRECTED USE DAILY TO ADMINISTER VICTOZA 100 each 5   fluticasone (FLONASE) 50 MCG/ACT nasal spray Place 2 sprays into both nostrils daily. 48 g 1   Fluticasone-Salmeterol (ADVAIR DISKUS) 250-50 MCG/DOSE AEPB Inhale 1 puff into the lungs 2 (two) times daily. 60 each 5   Insulin Glargine (LANTUS SOLOSTAR) 100 UNIT/ML Solostar Pen Inject 45 Units into the skin daily. 15 mL 2   insulin lispro (HUMALOG KWIKPEN) 100 UNIT/ML KwikPen Inject 0.1 mLs (10 Units total) into the skin 3 (three) times daily with meals as needed. 15 mL 2   Lancets (ONETOUCH DELICA PLUS ONGEXB28U) MISC INJECT 1 DEVICE INTO THE SKIN 3 (THREE) TIMES DAILY. TO CHECK BLOOD SUGAR 100 each 2   levothyroxine (SYNTHROID) 88 MCG tablet Take 1 and 1/2 tablet by mouth every Sunday. Take 1 tablet by mouth Mon through Sat. Take on an empty stomach with water only.  No food or other medications for 30 minutes. 92 tablet 0   metformin (FORTAMET) 1000 MG (OSM) 24 hr tablet TAKE 1 TABLET BY MOUTH EVERY DAY WITH BREAKFAST 90 tablet 2   montelukast (SINGULAIR) 10 MG tablet Take 1 tablet (10 mg total) by mouth at bedtime. For allergies and asthma. 90 tablet 3   ONETOUCH VERIO test strip USE AS DIRECTED TO CHECK FASTING BLOOD SUGARS THREE TIMES DAILY. 100 each 3   WIXELA INHUB 500-50 MCG/DOSE AEPB TAKE 1 PUFF BY MOUTH TWICE A DAY 60 each 0   No current facility-administered medications for this visit.     OBJECTIVE: Middle-aged white woman who appears stated age  28:   01/06/19 1429  BP: (!) 131/51  Pulse: 75  Resp: 18  Temp: 98.2 F (36.8 C)  SpO2: 97%     Body mass index is 34.4 kg/m.   Wt Readings from Last 3 Encounters:  01/06/19 206 lb 11.2 oz (93.8 kg)  12/29/18 204 lb (92.5 kg)  12/01/18 199 lb 8 oz (90.5 kg)      ECOG FS:1 -  Symptomatic but completely ambulatory  Sclerae unicteric, EOMs intact Wearing a mask No cervical or supraclavicular adenopathy Lungs no rales  or rhonchi Heart regular rate and rhythm Abd soft, nontender, positive bowel sounds MSK no focal spinal tenderness, no upper extremity lymphedema Neuro: nonfocal, well oriented, appropriate affect Breasts: The right breast has undergone lumpectomy.  There is no evidence of disease activity.  The left breast is unremarkable.  Both axillae are benign.   LAB RESULTS:  CMP     Component Value Date/Time   NA 137 01/06/2019 1416   K 4.2 01/06/2019 1416   CL 101 01/06/2019 1416   CO2 27 01/06/2019 1416   GLUCOSE 270 (H) 01/06/2019 1416   BUN 21 01/06/2019 1416   CREATININE 0.82 01/06/2019 1416   CALCIUM 9.3 01/06/2019 1416   PROT 6.4 (L) 01/06/2019 1416   ALBUMIN 3.6 01/06/2019 1416   AST 16 01/06/2019 1416   ALT 21 01/06/2019 1416   ALKPHOS 119 01/06/2019 1416   BILITOT 0.3 01/06/2019 1416   GFRNONAA >60 01/06/2019 1416   GFRAA >60 01/06/2019 1416    No results found for: TOTALPROTELP, ALBUMINELP, A1GS, A2GS, BETS, BETA2SER, GAMS, MSPIKE, SPEI  No results found for: KPAFRELGTCHN, LAMBDASER, KAPLAMBRATIO  Lab Results  Component Value Date   WBC 6.2 01/06/2019   NEUTROABS 3.7 01/06/2019   HGB 13.6 01/06/2019   HCT 42.3 01/06/2019   MCV 86.3 01/06/2019   PLT 229 01/06/2019    '@LASTCHEMISTRY' @  No results found for: LABCA2  No components found for: YKDXIP382  No results for input(s): INR in the last 168 hours.  No results found for: LABCA2  No results found for: NKN397  No results found for: QBH419  No results found for: FXT024  No results found for: CA2729  No components found for: HGQUANT  No results found for: CEA1 / No results found for: CEA1   No results found for: AFPTUMOR  No results found for: CHROMOGRNA  No results found for: PSA1  Appointment on 01/06/2019  Component Date Value Ref Range Status    WBC Count 01/06/2019 6.2  4.0 - 10.5 K/uL Final   RBC 01/06/2019 4.90  3.87 - 5.11 MIL/uL Final   Hemoglobin 01/06/2019 13.6  12.0 - 15.0 g/dL Final   HCT 01/06/2019 42.3  36.0 - 46.0 % Final   MCV 01/06/2019 86.3  80.0 - 100.0 fL Final   MCH 01/06/2019 27.8  26.0 - 34.0 pg Final   MCHC 01/06/2019 32.2  30.0 - 36.0 g/dL Final   RDW 01/06/2019 12.6  11.5 - 15.5 % Final   Platelet Count 01/06/2019 229  150 - 400 K/uL Final   nRBC 01/06/2019 0.0  0.0 - 0.2 % Final   Neutrophils Relative % 01/06/2019 60  % Final   Neutro Abs 01/06/2019 3.7  1.7 - 7.7 K/uL Final   Lymphocytes Relative 01/06/2019 25  % Final   Lymphs Abs 01/06/2019 1.5  0.7 - 4.0 K/uL Final   Monocytes Relative 01/06/2019 8  % Final   Monocytes Absolute 01/06/2019 0.5  0.1 - 1.0 K/uL Final   Eosinophils Relative 01/06/2019 6  % Final   Eosinophils Absolute 01/06/2019 0.3  0.0 - 0.5 K/uL Final   Basophils Relative 01/06/2019 1  % Final   Basophils Absolute 01/06/2019 0.1  0.0 - 0.1 K/uL Final   Immature Granulocytes 01/06/2019 0  % Final   Abs Immature Granulocytes 01/06/2019 0.02  0.00 - 0.07 K/uL Final   Performed at [facility], [place] [address] [place], [place] [address]   Sodium 01/06/2019 137  135 - 145 mmol/L Final  Potassium 01/06/2019 4.2  3.5 - 5.1 mmol/L Final   Chloride 01/06/2019 101  98 - 111 mmol/L Final   CO2 01/06/2019 27  22 - 32 mmol/L Final   Glucose, Bld 01/06/2019 270* 70 - 99 mg/dL Final   BUN 01/06/2019 21  8 - 23 mg/dL Final   Creatinine, Ser 01/06/2019 0.82  0.44 - 1.00 mg/dL Final   Calcium 01/06/2019 9.3  8.9 - 10.3 mg/dL Final   Total Protein 01/06/2019 6.4* 6.5 - 8.1 g/dL Final   Albumin 01/06/2019 3.6  3.5 - 5.0 g/dL Final   AST 01/06/2019 16  15 - 41 U/L Final   ALT 01/06/2019 21  0 - 44 U/L Final   Alkaline Phosphatase 01/06/2019 119  38 - 126 U/L Final   Total Bilirubin 01/06/2019 0.3  0.3 - 1.2 mg/dL Final   GFR calc non Af  Amer 01/06/2019 >60  >60 mL/min Final   GFR calc Af Amer 01/06/2019 >60  >60 mL/min Final   Anion gap 01/06/2019 9  5 - 15 Final   Performed at [facility], [address] [place], [place] [address]    (this displays the last labs from the last 3 days)  No results found for: TOTALPROTELP, ALBUMINELP, A1GS, A2GS, BETS, BETA2SER, GAMS, MSPIKE, SPEI (this displays SPEP labs)  No results found for: KPAFRELGTCHN, LAMBDASER, KAPLAMBRATIO (kappa/lambda light chains)  No results found for: HGBA, HGBA2QUANT, HGBFQUANT, HGBSQUAN (Hemoglobinopathy evaluation)   No results found for: LDH  No results found for: IRON, TIBC, IRONPCTSAT (Iron and TIBC)  No results found for: FERRITIN  Urinalysis    Component Value Date/Time   BILIRUBINUR Negative 10/13/2018 1142   PROTEINUR Negative 10/13/2018 1142   UROBILINOGEN 0.2 10/13/2018 1142   NITRITE Negative 10/13/2018 1142   LEUKOCYTESUR Negative 10/13/2018 1142     STUDIES: No results found.   ELIGIBLE FOR AVAILABLE RESEARCH PROTOCOL: no   ASSESSMENT: 73 y.o. [place], [place] woman status post right breast biopsy x2 06/26/2017 showing atypical ductal hyperplasia and atypical lobular hyperplasia  (1) right lumpectomy x2 on 08/12/2017 shows no evidence of malignancy at either site  (2) genetics testing 11/23/2017 through the Hereditary Gene Panel offered by [facility] found no deleterious mutations in APC, ATM, AXIN2, BARD1, BMPR1A, BRCA1, BRCA2, BRIP1, CDH1, CDK4, CDKN2A (p14ARF), CDKN2A (p16INK4a), CHEK2, CTNNA1, DICER1, EPCAM (Deletion/duplication testing only), GREM1 (promoter region deletion/duplication testing only), KIT, MEN1, MLH1, MSH2, MSH3, MSH6, MUTYH, NBN, NF1, NHTL1, PALB2, PDGFRA, PMS2, POLD1, POLE, PTEN, RAD50, RAD51C, RAD51D, SDHB, SDHC, SDHD, SMAD4, SMARCA4. STK11, TP53, TSC1, TSC2, and VHL.  The following genes were evaluated for sequence changes only: SDHA and HOXB13 c.251G>A variant only.  (3) breast  cancer high risk: intensified screening:  (a) yearly mammography in May  (b) yearly breast MRI in November  (c) biannual MD breast exam  (4) breast cancer high risk: risk reduction  (a) anastrozole started 01/05/2018   PLAN: Molly Wall is doing very well from a breast cancer point of view, and is tolerating anastrozole without significant side effects.  The plan is to continue anastrozole for a total of 5 years.  She is also undergoing intensified screening.  I have set her up for breast mammography in April and a breast MRI in October of next year.  She is concerned that her blood sugars are not where she would like them to be despite her primary care physician's best efforts.  She wonders if she should see an endocrinologist.  I gave her name to  discuss with her primary care doctor.  Otherwise she will return to see me in 1 year.  She knows to call for any other issue that may develop before then.  Taunja Brickner, Molly Dad, MD  01/06/19 5:10 PM Medical Oncology and Hematology [facility] [address] [place], [place] [address] Tel. [phone]    Fax. [phone]   I, Wilburn Mylar, am acting as scribe for Dr. Virgie Wall. Rupert Azzara.  I, Lurline Del MD, have reviewed the above documentation for accuracy and completeness, and I agree with the above.

## 2019-01-06 ENCOUNTER — Inpatient Hospital Stay: Payer: BC Managed Care – PPO | Admitting: Oncology

## 2019-01-06 ENCOUNTER — Other Ambulatory Visit: Payer: Self-pay

## 2019-01-06 ENCOUNTER — Inpatient Hospital Stay: Payer: BC Managed Care – PPO | Attending: Oncology

## 2019-01-06 VITALS — BP 131/51 | HR 75 | Temp 98.2°F | Resp 18 | Ht 65.0 in | Wt 206.7 lb

## 2019-01-06 DIAGNOSIS — M542 Cervicalgia: Secondary | ICD-10-CM | POA: Diagnosis not present

## 2019-01-06 DIAGNOSIS — Z7951 Long term (current) use of inhaled steroids: Secondary | ICD-10-CM | POA: Diagnosis not present

## 2019-01-06 DIAGNOSIS — M545 Low back pain: Secondary | ICD-10-CM | POA: Diagnosis not present

## 2019-01-06 DIAGNOSIS — Z803 Family history of malignant neoplasm of breast: Secondary | ICD-10-CM

## 2019-01-06 DIAGNOSIS — Z800 Family history of malignant neoplasm of digestive organs: Secondary | ICD-10-CM | POA: Diagnosis not present

## 2019-01-06 DIAGNOSIS — E039 Hypothyroidism, unspecified: Secondary | ICD-10-CM | POA: Diagnosis not present

## 2019-01-06 DIAGNOSIS — M5416 Radiculopathy, lumbar region: Secondary | ICD-10-CM | POA: Diagnosis not present

## 2019-01-06 DIAGNOSIS — Z79811 Long term (current) use of aromatase inhibitors: Secondary | ICD-10-CM | POA: Diagnosis not present

## 2019-01-06 DIAGNOSIS — Z8249 Family history of ischemic heart disease and other diseases of the circulatory system: Secondary | ICD-10-CM | POA: Diagnosis not present

## 2019-01-06 DIAGNOSIS — M5412 Radiculopathy, cervical region: Secondary | ICD-10-CM | POA: Diagnosis not present

## 2019-01-06 DIAGNOSIS — Z79899 Other long term (current) drug therapy: Secondary | ICD-10-CM | POA: Insufficient documentation

## 2019-01-06 DIAGNOSIS — Z87891 Personal history of nicotine dependence: Secondary | ICD-10-CM | POA: Insufficient documentation

## 2019-01-06 DIAGNOSIS — N6091 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: Secondary | ICD-10-CM

## 2019-01-06 DIAGNOSIS — Z794 Long term (current) use of insulin: Secondary | ICD-10-CM | POA: Insufficient documentation

## 2019-01-06 DIAGNOSIS — Z1379 Encounter for other screening for genetic and chromosomal anomalies: Secondary | ICD-10-CM

## 2019-01-06 DIAGNOSIS — E119 Type 2 diabetes mellitus without complications: Secondary | ICD-10-CM | POA: Diagnosis not present

## 2019-01-06 LAB — COMPREHENSIVE METABOLIC PANEL
ALT: 21 U/L (ref 0–44)
AST: 16 U/L (ref 15–41)
Albumin: 3.6 g/dL (ref 3.5–5.0)
Alkaline Phosphatase: 119 U/L (ref 38–126)
Anion gap: 9 (ref 5–15)
BUN: 21 mg/dL (ref 8–23)
CO2: 27 mmol/L (ref 22–32)
Calcium: 9.3 mg/dL (ref 8.9–10.3)
Chloride: 101 mmol/L (ref 98–111)
Creatinine, Ser: 0.82 mg/dL (ref 0.44–1.00)
GFR calc Af Amer: >60 mL/min (ref >60)
GFR calc non Af Amer: >60 mL/min (ref >60)
Glucose, Bld: 270 mg/dL — ABNORMAL HIGH (ref 70–99)
Potassium: 4.2 mmol/L (ref 3.5–5.1)
Sodium: 137 mmol/L (ref 135–145)
Total Bilirubin: 0.3 mg/dL (ref 0.3–1.2)
Total Protein: 6.4 g/dL — ABNORMAL LOW (ref 6.5–8.1)

## 2019-01-06 LAB — CBC WITH DIFFERENTIAL (CANCER CENTER ONLY)
Abs Immature Granulocytes: 0.02 10*3/uL (ref 0.00–0.07)
Basophils Absolute: 0.1 10*3/uL (ref 0.0–0.1)
Basophils Relative: 1 %
Eosinophils Absolute: 0.3 10*3/uL (ref 0.0–0.5)
Eosinophils Relative: 6 %
HCT: 42.3 % (ref 36.0–46.0)
Hemoglobin: 13.6 g/dL (ref 12.0–15.0)
Immature Granulocytes: 0 %
Lymphocytes Relative: 25 %
Lymphs Abs: 1.5 10*3/uL (ref 0.7–4.0)
MCH: 27.8 pg (ref 26.0–34.0)
MCHC: 32.2 g/dL (ref 30.0–36.0)
MCV: 86.3 fL (ref 80.0–100.0)
Monocytes Absolute: 0.5 10*3/uL (ref 0.1–1.0)
Monocytes Relative: 8 %
Neutro Abs: 3.7 10*3/uL (ref 1.7–7.7)
Neutrophils Relative %: 60 %
Platelet Count: 229 10*3/uL (ref 150–400)
RBC: 4.9 MIL/uL (ref 3.87–5.11)
RDW: 12.6 % (ref 11.5–15.5)
WBC Count: 6.2 10*3/uL (ref 4.0–10.5)
nRBC: 0 % (ref 0.0–0.2)

## 2019-01-11 DIAGNOSIS — M545 Low back pain: Secondary | ICD-10-CM | POA: Diagnosis not present

## 2019-01-11 DIAGNOSIS — M5412 Radiculopathy, cervical region: Secondary | ICD-10-CM | POA: Diagnosis not present

## 2019-01-11 DIAGNOSIS — M5416 Radiculopathy, lumbar region: Secondary | ICD-10-CM | POA: Diagnosis not present

## 2019-01-11 DIAGNOSIS — M542 Cervicalgia: Secondary | ICD-10-CM | POA: Diagnosis not present

## 2019-01-12 DIAGNOSIS — E119 Type 2 diabetes mellitus without complications: Secondary | ICD-10-CM

## 2019-01-12 DIAGNOSIS — Z794 Long term (current) use of insulin: Secondary | ICD-10-CM

## 2019-01-12 DIAGNOSIS — J302 Other seasonal allergic rhinitis: Secondary | ICD-10-CM

## 2019-01-13 MED ORDER — FLUTICASONE PROPIONATE 50 MCG/ACT NA SUSP: 1.0000 | Freq: Two times a day (BID) | NASAL | 0 refills | Status: DC | PRN

## 2019-01-13 MED ORDER — LANTUS SOLOSTAR 100 UNIT/ML ~~LOC~~ SOPN: 45.0000 [IU] | PEN_INJECTOR | Freq: Every day | SUBCUTANEOUS | 5 refills | Status: DC

## 2019-01-13 MED ORDER — NOVOLOG FLEXPEN 100 UNIT/ML ~~LOC~~ SOPN: 10.0000 [IU] | PEN_INJECTOR | Freq: Three times a day (TID) | SUBCUTANEOUS | 3 refills | Status: DC

## 2019-01-14 ENCOUNTER — Other Ambulatory Visit: Payer: Self-pay | Admitting: Primary Care

## 2019-01-14 ENCOUNTER — Telehealth: Payer: Self-pay

## 2019-01-14 DIAGNOSIS — Z794 Long term (current) use of insulin: Secondary | ICD-10-CM

## 2019-01-14 DIAGNOSIS — Z03818 Encounter for observation for suspected exposure to other biological agents ruled out: Secondary | ICD-10-CM | POA: Diagnosis not present

## 2019-01-14 DIAGNOSIS — J069 Acute upper respiratory infection, unspecified: Secondary | ICD-10-CM | POA: Diagnosis not present

## 2019-01-14 DIAGNOSIS — E119 Type 2 diabetes mellitus without complications: Secondary | ICD-10-CM

## 2019-01-14 NOTE — Telephone Encounter (Signed)
Pt went to [employer] in [place] about 10 days ago and did not wear a mask or social distance. Starting today pt has congested nose,h/a that is at her forehead,pt is extremely tired,both ears hurt,no cough or S/T, no weakness and no fever; T 98.0 now.pt did have chills earlier today; SOB with exertion and for the past hour pt feels heaviness in lungs. Pt had diarrhea 2 hrs ago.pt does have metallic taste in mouth. Pt said the friend that she went with to the rally is also sick and pts son told pt that there are 2 confirmed + covid cases of people who did attend the [place] rally. The [facility] testing sites have closed until Monday. Pt is going to [facility] now. FYI to Gentry Fitz NP who is out of office and to Dr Diona Browner who is in office.

## 2019-01-14 NOTE — Telephone Encounter (Signed)
noted 

## 2019-01-14 NOTE — Telephone Encounter (Signed)
Noted  

## 2019-01-17 NOTE — Telephone Encounter (Signed)
Patient called back and she tested for covid at [facility] on Friday and was notified she tested positive on Saturday.  Patient said she's under quarantine. Patient said symptoms are very mild.  Patient was put on antibiotics and it's helping with they symptoms.  Patient's feeling better. Patient said  Since Sunday, she doesn't have taste or smell. Patient's lungs are clear. Patient doesn't have shortness of breath.  She's been taking for the last month Emergen-c and Emergen-c immune plus with Vit C and Zinc. Patient has run out of test strips.  Patient said she picked them up on Thursday and she can't find them.  Patient said [facility] is checking with insurance and will request another rx.

## 2019-01-19 NOTE — Telephone Encounter (Signed)
Noted. Will keep a watch for the request

## 2019-02-02 ENCOUNTER — Other Ambulatory Visit: Payer: Self-pay | Admitting: Primary Care

## 2019-02-02 DIAGNOSIS — E119 Type 2 diabetes mellitus without complications: Secondary | ICD-10-CM

## 2019-02-02 DIAGNOSIS — J302 Other seasonal allergic rhinitis: Secondary | ICD-10-CM

## 2019-02-02 DIAGNOSIS — Z794 Long term (current) use of insulin: Secondary | ICD-10-CM

## 2019-02-03 ENCOUNTER — Telehealth: Payer: Self-pay | Admitting: Primary Care

## 2019-02-03 DIAGNOSIS — Z794 Long term (current) use of insulin: Secondary | ICD-10-CM

## 2019-02-03 DIAGNOSIS — E119 Type 2 diabetes mellitus without complications: Secondary | ICD-10-CM

## 2019-02-03 MED ORDER — METFORMIN HCL ER (OSM) 1000 MG PO TB24: ORAL_TABLET | ORAL | 2 refills | Status: DC

## 2019-02-03 NOTE — Telephone Encounter (Signed)
Noted, Rx changed. 

## 2019-02-03 NOTE — Telephone Encounter (Signed)
Patient returned Chan's call and I notified patient rx was changed and rx sent to pharmacy.

## 2019-02-03 NOTE — Telephone Encounter (Signed)
Ok to change

## 2019-02-07 ENCOUNTER — Other Ambulatory Visit: Payer: Self-pay

## 2019-02-07 ENCOUNTER — Ambulatory Visit: Payer: BC Managed Care – PPO

## 2019-02-07 DIAGNOSIS — G4733 Obstructive sleep apnea (adult) (pediatric): Secondary | ICD-10-CM

## 2019-02-07 DIAGNOSIS — M542 Cervicalgia: Secondary | ICD-10-CM | POA: Diagnosis not present

## 2019-02-07 DIAGNOSIS — M545 Low back pain: Secondary | ICD-10-CM | POA: Diagnosis not present

## 2019-02-07 DIAGNOSIS — M5412 Radiculopathy, cervical region: Secondary | ICD-10-CM | POA: Diagnosis not present

## 2019-02-07 DIAGNOSIS — K1123 Chronic sialoadenitis: Secondary | ICD-10-CM | POA: Diagnosis not present

## 2019-02-07 DIAGNOSIS — G4719 Other hypersomnia: Secondary | ICD-10-CM

## 2019-02-07 DIAGNOSIS — M5416 Radiculopathy, lumbar region: Secondary | ICD-10-CM | POA: Diagnosis not present

## 2019-02-08 ENCOUNTER — Encounter: Payer: Self-pay | Admitting: Primary Care

## 2019-02-08 ENCOUNTER — Ambulatory Visit (INDEPENDENT_AMBULATORY_CARE_PROVIDER_SITE_OTHER): Payer: BC Managed Care – PPO | Admitting: Primary Care

## 2019-02-08 VITALS — BP 126/70 | HR 78 | Temp 97.8°F | Ht 65.0 in | Wt 208.8 lb

## 2019-02-08 DIAGNOSIS — G4733 Obstructive sleep apnea (adult) (pediatric): Secondary | ICD-10-CM | POA: Diagnosis not present

## 2019-02-08 DIAGNOSIS — M542 Cervicalgia: Secondary | ICD-10-CM | POA: Diagnosis not present

## 2019-02-08 DIAGNOSIS — E785 Hyperlipidemia, unspecified: Secondary | ICD-10-CM

## 2019-02-08 DIAGNOSIS — E119 Type 2 diabetes mellitus without complications: Secondary | ICD-10-CM | POA: Diagnosis not present

## 2019-02-08 DIAGNOSIS — M545 Low back pain: Secondary | ICD-10-CM | POA: Diagnosis not present

## 2019-02-08 DIAGNOSIS — M5412 Radiculopathy, cervical region: Secondary | ICD-10-CM | POA: Diagnosis not present

## 2019-02-08 DIAGNOSIS — M5416 Radiculopathy, lumbar region: Secondary | ICD-10-CM | POA: Diagnosis not present

## 2019-02-08 DIAGNOSIS — Z794 Long term (current) use of insulin: Secondary | ICD-10-CM | POA: Diagnosis not present

## 2019-02-08 LAB — POCT GLYCOSYLATED HEMOGLOBIN (HGB A1C): Hemoglobin A1C: 9.1 % — AB (ref 4.0–5.6)

## 2019-02-08 MED ORDER — ATORVASTATIN CALCIUM 20 MG PO TABS: ORAL_TABLET | ORAL | 0 refills | Status: DC

## 2019-02-08 MED ORDER — NOVOLOG FLEXPEN 100 UNIT/ML ~~LOC~~ SOPN: PEN_INJECTOR | SUBCUTANEOUS | 3 refills | Status: DC

## 2019-02-08 MED ORDER — ATORVASTATIN CALCIUM 20 MG PO TABS: ORAL_TABLET | ORAL | 3 refills | Status: DC

## 2019-02-08 NOTE — Progress Notes (Signed)
Subjective:    Patient ID: Molly Wall, female    DOB: Jul 19, 1945, 73 y.o.   MRN: [id number]  HPI  Molly Wall is a 73 year old female with a history of type 2 diabetes, hypothyroidism, chronic back pain, hyperlipidemia who presents today for follow up of diabetes.  Current medications include: Metformin ER 1000 mg daily, Lantus 45 units daily, Novolog 10 units TID with meals.  She is checking her blood glucose 3+4 times daily and is getting readings of:  AM fasting: 90's-mid 100's Before lunch: high 100's to mid 200's Before dinner: 65, 218, 262, 315, 215, 299, 186, 123, 278, 194, 476, 160, 219  Last A1C: 9.6 in August 2020, 9.1 today. Last Eye Exam:  Last Foot Exam: Due in May 2021 Pneumonia Vaccination: Completed in 2020 ACE/ARB: urine microalbumin negative in February 2020 Statin: atorvastatin   She endorses a healthy diet, some fast food but tries to limit. Eating vegetables, fruit, peanut butter, lean protein, soups. She is drinking plenty of water.   Review of Systems  Constitutional: Positive for fatigue.  Eyes: Negative for visual disturbance.  Respiratory: Negative for shortness of breath.   Cardiovascular: Negative for chest pain.  Neurological: Negative for dizziness and headaches.       Past Medical History:  Diagnosis Date  . Allergic rhinitis   . Allergy   . Asthma   . Atypical ductal hyperplasia of right breast   . 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 Breast cancer ([place])   . Diabetes mellitus without complication ([facility])   . Family history of breast cancer   . Family history of colon cancer   . Hypercholesteremia   . Hypothyroidism   . Lumbar herniated disc   . OSA (obstructive sleep apnea)    used to use CPAP, not any more over 1 1/2 yrs  . Salivary stone    left side  . Sleep apnea    no cpap in 2 yrs   . Vitamin D deficiency      Social History   Socioeconomic History  . Marital status: Divorced    Spouse name:  Not on file  . Number of children: 2  . Years of education: Not on file  . Highest education level: Not on file  Occupational History  . Not on file  Social Needs  . Financial resource strain: Not on file  . Food insecurity    Worry: Not on file    Inability: Not on file  . Transportation needs    Medical: Not on file    Non-medical: Not on file  Tobacco Use  . Smoking status: Former Smoker    Packs/day: 1.00    Years: 2.00    Pack years: 2.00    Types: Cigarettes  . Smokeless tobacco: Never Used  . Tobacco comment: quite smoker at age 89  Substance and Sexual Activity  . Alcohol use: Yes    Alcohol/week: 0.0 - 1.0 standard drinks    Comment: 0-1 per month  . Drug use: No  . Sexual activity: Not Currently  Lifestyle  . Physical activity    Days per week: Not on file    Minutes per session: Not on file  . Stress: Not on file  Relationships  . Social Herbalist on phone: Not on file    Gets together: Not on file    Attends religious service: Not on file    Active member of club  or organization: Not on file    Attends meetings of clubs or organizations: Not on file    Relationship status: Not on file  . Intimate partner violence    Fear of current or ex partner: Not on file    Emotionally abused: Not on file    Physically abused: Not on file    Forced sexual activity: Not on file  Other Topics Concern  . Not on file  Social History Narrative   Single.   Moved from [place] to [place] several weeks ago.   Family lives in [place].   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    Past Surgical History:  Procedure Laterality Date  . APPENDECTOMY    . BREAST LUMPECTOMY WITH RADIOACTIVE SEED LOCALIZATION Right 08/12/2017   Procedure: RIGHT BREAST LUMPECTOMY X'S 2 WITH RADIOACTIVE SEED LOCALIZATION X'S 2;  Surgeon: Fanny Skates, MD;  Location: [facility];   Service: General;  Laterality: Right;  . COLONOSCOPY     11 yrs ago in [place] but given a 10 yr recall per pt   . GANGLION CYST EXCISION     73 years old  . INCISION AND DRAINAGE ABSCESS ANAL    . KNEE SURGERY  2014  . SHOULDER SURGERY  1976    Family History  Problem Relation Age of Onset  . Alzheimer's disease Mother   . Asthma Mother   . Dementia Mother        cause of death  . Arthritis Father   . Heart attack Father 61       cause of death  . Breast cancer Sister 34  . Colon cancer Paternal Grandmother   . Liver cancer Maternal Uncle   . Colon polyps Neg Hx   . Rectal cancer Neg Hx   . Stomach cancer Neg Hx     Allergies  Allergen Reactions  . 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 She described it was a small amount in a milk carton in [place]. She states they stopped using it shortly after because the trial wasn't successful. SPM    Current Outpatient Medications on File Prior to Visit  Medication Sig Dispense Refill  . albuterol (PROAIR HFA) 108 (90 Base) MCG/ACT inhaler Inhale 2 puffs into the lungs every 6 (six) hours as needed for wheezing or shortness of breath. NEED APPOINTMENT FOR ANY MORE REFILLS 18 g 0  . anastrozole (ARIMIDEX) 1 MG tablet Take 1 tablet (1 mg total) by mouth daily. 90 tablet 4  . atorvastatin (LIPITOR) 20 MG tablet TAKE 1 TABLET BY MOUTH EVERY DAY FOR CHOLESTEROL 30 tablet 1  . B-D UF III MINI PEN NEEDLES 31G X 5 MM MISC USE AS DIRECTED USE DAILY TO ADMINISTER VICTOZA 100 each 5  . fluticasone (FLONASE) 50 MCG/ACT nasal spray USE 1 SPRAY IN BOTH  NOSTRILS TWICE DAILY AS  NEEDED FOR ALLERGIES OR  RHINITIS 48 g 2  . Fluticasone-Salmeterol (ADVAIR DISKUS) 250-50 MCG/DOSE AEPB Inhale 1 puff into the lungs 2 (two) times daily. 60 each 5  . insulin aspart (NOVOLOG FLEXPEN) 100 UNIT/ML FlexPen Inject 10 Units into the skin 3 (three) times daily with meals. 15  mL 3  . Insulin Glargine (LANTUS SOLOSTAR) 100 UNIT/ML Solostar Pen Inject 45 Units into the skin daily. 15 mL 5  . Lancets [facility]  DELICA PLUS [id number]) MISC INJECT 1 DEVICE INTO THE SKIN 3 (THREE) TIMES DAILY. TO CHECK BLOOD SUGAR 100 each 2  . levothyroxine (SYNTHROID) 88 MCG tablet Take 1 and 1/2 tablet by mouth every Sunday. Take 1 tablet by mouth Mon through Sat. Take on an empty stomach with water only.  No food or other medications for 30 minutes. 92 tablet 0  . metFORMIN (GLUCOPHAGE-XR) 500 MG 24 hr tablet TAKE 2 TABLETS BY MOUTH EVERY MORNING 180 tablet 3  . montelukast (SINGULAIR) 10 MG tablet Take 1 tablet (10 mg total) by mouth at bedtime. For allergies and asthma. 90 tablet 3  . ONETOUCH VERIO test strip USE AS DIRECTED TO CHECK FASTING BLOOD SUGARS THREE TIMES DAILY. 100 each 3  . WIXELA INHUB 500-50 MCG/DOSE AEPB TAKE 1 PUFF BY MOUTH TWICE A DAY 60 each 0   No current facility-administered medications on file prior to visit.     BP 126/70   Pulse 78   Temp 97.8 F (36.6 C) (Temporal)   Ht 5\' 5"  (1.651 m)   Wt 208 lb 12 oz (94.7 kg)   SpO2 97%   BMI 34.74 kg/m    Objective:   Physical Exam  Constitutional: She appears well-nourished.  Neck: Neck supple.  Cardiovascular: Normal rate and regular rhythm.  Respiratory: Effort normal and breath sounds normal.  Skin: Skin is warm and dry.  Psychiatric: She has a normal mood and affect.           Assessment & Plan:

## 2019-02-08 NOTE — Assessment & Plan Note (Signed)
Slight improvement in A1C today, still above goal. Post prandial glucose readings after lunch and dinner are the highest.   Continue Lantus 45 units given glucose readings in the 90's. Continue Novolog 10 units before breakfast as she has had several low readings within a few hours after breakfast. Increase Novolog to 15 units with lunch and dinner.  Follow up in 3 months, she will contact sooner if glucose readings are at or above 200 consistently.

## 2019-02-08 NOTE — Patient Instructions (Signed)
We've increased your Novolog to 15 units before lunch and dinner. Continue injecting 10 units before lunch.  Continue injecting Lantus 45 units.  Continue to work on [employer]. Ensure you are consuming 64 ounces of water daily.  Please notify me if you continue to see readings consistently at or above 200.  Please schedule a follow up appointment in 3 months for diabetes check.  It was a pleasure to see you today!

## 2019-02-09 ENCOUNTER — Telehealth: Payer: Self-pay

## 2019-02-09 DIAGNOSIS — G4733 Obstructive sleep apnea (adult) (pediatric): Secondary | ICD-10-CM

## 2019-02-09 DIAGNOSIS — M542 Cervicalgia: Secondary | ICD-10-CM | POA: Diagnosis not present

## 2019-02-09 DIAGNOSIS — M5416 Radiculopathy, lumbar region: Secondary | ICD-10-CM | POA: Diagnosis not present

## 2019-02-09 DIAGNOSIS — M5412 Radiculopathy, cervical region: Secondary | ICD-10-CM | POA: Diagnosis not present

## 2019-02-09 DIAGNOSIS — M545 Low back pain: Secondary | ICD-10-CM | POA: Diagnosis not present

## 2019-02-09 NOTE — Telephone Encounter (Signed)
Call made to patient, confirmed DOB, made aware of HST results:  AHI 13.1/hour, mild osa  Recommend cpap therapy auto titrating 5-15cm.   Encourage weight loss measures. Caution against driving while sleepy and against medications with sedative side effects.   Pt in agreement with placing order.   Order placed.   I am not sure who to have this pt f/u with. AO should I place a recall for pt to see sood in 6-8 weeks being that she lives in [place]. Thanks.

## 2019-02-10 NOTE — Telephone Encounter (Signed)
Should be appropriate for follow-up with Dr. Halford Chessman

## 2019-02-11 NOTE — Telephone Encounter (Signed)
Recall placed for 6 week f/u with VS at [place] location.   Nothing further needed at this time.

## 2019-02-14 DIAGNOSIS — M542 Cervicalgia: Secondary | ICD-10-CM | POA: Diagnosis not present

## 2019-02-14 DIAGNOSIS — M5412 Radiculopathy, cervical region: Secondary | ICD-10-CM | POA: Diagnosis not present

## 2019-02-14 DIAGNOSIS — M545 Low back pain: Secondary | ICD-10-CM | POA: Diagnosis not present

## 2019-02-14 DIAGNOSIS — M5416 Radiculopathy, lumbar region: Secondary | ICD-10-CM | POA: Diagnosis not present

## 2019-02-17 DIAGNOSIS — M5416 Radiculopathy, lumbar region: Secondary | ICD-10-CM | POA: Diagnosis not present

## 2019-02-17 DIAGNOSIS — M542 Cervicalgia: Secondary | ICD-10-CM | POA: Diagnosis not present

## 2019-02-17 DIAGNOSIS — M5412 Radiculopathy, cervical region: Secondary | ICD-10-CM | POA: Diagnosis not present

## 2019-02-17 DIAGNOSIS — M545 Low back pain: Secondary | ICD-10-CM | POA: Diagnosis not present

## 2019-02-22 DIAGNOSIS — G4733 Obstructive sleep apnea (adult) (pediatric): Secondary | ICD-10-CM | POA: Diagnosis not present

## 2019-02-24 DIAGNOSIS — M5412 Radiculopathy, cervical region: Secondary | ICD-10-CM | POA: Diagnosis not present

## 2019-02-24 DIAGNOSIS — M542 Cervicalgia: Secondary | ICD-10-CM | POA: Diagnosis not present

## 2019-02-24 DIAGNOSIS — M5416 Radiculopathy, lumbar region: Secondary | ICD-10-CM | POA: Diagnosis not present

## 2019-02-24 DIAGNOSIS — M545 Low back pain: Secondary | ICD-10-CM | POA: Diagnosis not present

## 2019-02-28 ENCOUNTER — Other Ambulatory Visit: Payer: Self-pay | Admitting: Primary Care

## 2019-02-28 DIAGNOSIS — E119 Type 2 diabetes mellitus without complications: Secondary | ICD-10-CM

## 2019-02-28 DIAGNOSIS — Z794 Long term (current) use of insulin: Secondary | ICD-10-CM

## 2019-02-28 MED ORDER — BD PEN NEEDLE MINI U/F 31G X 5 MM MISC: 2 refills | Status: DC

## 2019-02-28 MED ORDER — ONETOUCH DELICA PLUS LANCET33G MISC: 1.0000 | Freq: Three times a day (TID) | 2 refills | Status: DC

## 2019-02-28 MED ORDER — ONETOUCH VERIO VI STRP: ORAL_STRIP | 2 refills | Status: DC

## 2019-02-28 MED ORDER — LANTUS SOLOSTAR 100 UNIT/ML ~~LOC~~ SOPN: 45.0000 [IU] | PEN_INJECTOR | Freq: Every day | SUBCUTANEOUS | 2 refills | Status: DC

## 2019-03-01 ENCOUNTER — Ambulatory Visit: Payer: BC Managed Care – PPO | Admitting: Internal Medicine

## 2019-03-01 ENCOUNTER — Encounter: Payer: Self-pay | Admitting: Internal Medicine

## 2019-03-01 ENCOUNTER — Other Ambulatory Visit: Payer: Self-pay

## 2019-03-01 ENCOUNTER — Other Ambulatory Visit: Payer: Self-pay | Admitting: Primary Care

## 2019-03-01 DIAGNOSIS — Z794 Long term (current) use of insulin: Secondary | ICD-10-CM | POA: Diagnosis not present

## 2019-03-01 DIAGNOSIS — E1165 Type 2 diabetes mellitus with hyperglycemia: Secondary | ICD-10-CM

## 2019-03-01 DIAGNOSIS — E039 Hypothyroidism, unspecified: Secondary | ICD-10-CM

## 2019-03-01 MED ORDER — NOVOLOG FLEXPEN 100 UNIT/ML ~~LOC~~ SOPN: PEN_INJECTOR | SUBCUTANEOUS | 3 refills | Status: DC

## 2019-03-01 MED ORDER — DEXCOM G6 RECEIVER DEVI: 1.0000 | Freq: Once | 0 refills | Status: AC

## 2019-03-01 MED ORDER — DEXCOM G6 SENSOR MISC: 1.0000 | 3 refills | Status: AC

## 2019-03-01 MED ORDER — OZEMPIC (0.25 OR 0.5 MG/DOSE) 2 MG/1.5ML ~~LOC~~ SOPN: 0.5000 mg | PEN_INJECTOR | SUBCUTANEOUS | 5 refills | Status: DC

## 2019-03-01 MED ORDER — DEXCOM G6 TRANSMITTER MISC: 1.0000 | 3 refills | Status: DC

## 2019-03-01 MED ORDER — LANTUS SOLOSTAR 100 UNIT/ML ~~LOC~~ SOPN: 35.0000 [IU] | PEN_INJECTOR | Freq: Every day | SUBCUTANEOUS | 2 refills | Status: DC

## 2019-03-01 NOTE — Progress Notes (Signed)
Patient ID: Molly Wall, female   DOB: Mar 12, 1946, 73 y.o.   MRN: [id number]   This visit occurred during the SARS-CoV-2 public health emergency.  Safety protocols were in place, including screening questions prior to the visit, additional usage of staff PPE, and extensive cleaning of exam room while observing appropriate contact time as indicated for disinfecting solutions.   HPI: Molly Wall is a 73 y.o.-year-old female, referred by her PCP, Alma Friendly, MD, for management of DM2, dx at 23 (2017), h/o reactive hypoglycemia, insulin-dependent, uncontrolled, without long-term complications.  Reviewed her HbA1c levels: Lab Results  Component Value Date   HGBA1C 9.1 (A) 02/08/2019   HGBA1C 9.6 (A) 10/27/2018   HGBA1C 8.2 (A) 07/30/2018   HGBA1C 11.1 (A) 04/27/2018   HGBA1C 11.3 (H) 12/09/2017   HGBA1C 6.8 (H) 04/10/2016   HGBA1C 6.6 (H) 01/09/2016   HGBA1C 6.4 07/16/2015   Pt is on a regimen of: - Metformin ER 1000 mg 1x a day, with meals - Lantus 45 units in am  - Novolog 12-24-08 >> 12-29-13 units 3x a day, before meals - If sugars are >150 only. At bedtime, may take 15 units if sugars are high.  Pt checks her sugars 4 times a day and they are fluctuating from the 60s to the 400s: - am: 82-374 - 2h after b'fast: 240 - before lunch: 70-240, 376 - 2h after lunch: 100-126 - before dinner: 87-448, 578 - 2h after dinner: 267, 289 - bedtime:55, 166,  286 - nighttime: n/c Lowest sugar was 55; she has hypoglycemia awareness at 80.  Highest sugar was 578.  Glucometer: One Touch Verio  Pt's meals are: - Breakfast: Alcohol + peanut butter; English muffin/cream cheese - Lunch: Soup (homemade) or 1 meat + 2 veggies - Dinner: 1 meat + 2 veggies or stew + bread - Snacks: 1 snack at bedtime-nuts  - no CKD, last BUN/creatinine:  Lab Results  Component Value Date   BUN 21 01/06/2019   BUN 17 12/30/2018   CREATININE 0.82 01/06/2019   CREATININE 0.71 12/30/2018  Not on an  ACE inhibitor/ARB.  -No HL; last set of lipids: Lab Results  Component Value Date   CHOL 158 04/27/2018   HDL 54.50 04/27/2018   LDLCALC 88 04/27/2018   TRIG 75.0 04/27/2018   CHOLHDL 3 04/27/2018  On Lipitor 20.  - last eye exam was on 02/04/2018: No DR.   - no numbness and tingling in her feet.  Pt has FH of DM in [place].  She also has a history of OSA - on CPAP started 3 days ago >> feels much better.  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 On LT4 88 mcg daily.  She lives alone.  ROS: Constitutional: + Both weight gain and loss, no fatigue, no subjective hyperthermia, no subjective hypothermia, + nocturia Eyes: + Blurry vision, no xerophthalmia ENT: no sore throat, no nodules palpated in neck, no dysphagia, no odynophagia, no hoarseness, no tinnitus, no hypoacusis Cardiovascular: no CP, no SOB, no palpitations, no leg swelling Respiratory: no cough, no SOB, no wheezing Gastrointestinal: no N, no V, no D, + C, no acid reflux Musculoskeletal: + Muscle, no joint aches Skin: no rash, no hair loss Neurological: no tremors, no numbness or tingling/no dizziness/no HAs Psychiatric: no depression, no anxiety  Past Medical History:  Diagnosis Date  . Allergic rhinitis   . Allergy   . Asthma   . Atypical ductal hyperplasia of right breast   . 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 Breast cancer ([facility])   . Diabetes mellitus without complication ([facility])   . Family history of breast cancer   . Family history of colon cancer   . Hypercholesteremia   . Hypothyroidism   . Lumbar herniated disc   . OSA (obstructive sleep apnea)    used to use CPAP, not any more over 1 1/2 yrs  . Salivary stone    left side  . Sleep apnea    no cpap in 2 yrs   . Vitamin D deficiency    Past Surgical History:  Procedure Laterality Date   . APPENDECTOMY    . BREAST LUMPECTOMY WITH RADIOACTIVE SEED LOCALIZATION Right 08/12/2017   Procedure: RIGHT BREAST LUMPECTOMY X'S 2 WITH RADIOACTIVE SEED LOCALIZATION X'S 2;  Surgeon: Fanny Skates, MD;  Location: [facility];  Service: General;  Laterality: Right;  . COLONOSCOPY     11 yrs ago in [place] but given a 10 yr recall per pt   . GANGLION CYST EXCISION     73 years old  . INCISION AND DRAINAGE ABSCESS ANAL    . KNEE SURGERY  2014  . SHOULDER SURGERY  1976   Social History   Socioeconomic History  . Marital status: Divorced    Spouse name: Not on file  . Number of children: 2  . Years of education: Not on file  . Highest education level: Not on file  Occupational History  . Not on file  Tobacco Use  . Smoking status: Former Smoker    Packs/day: 1.00    Years: 2.00    Pack years: 2.00    Types: Cigarettes  . Smokeless tobacco: Never Used  . Tobacco comment: quite smoker at age 40  Substance and Sexual Activity  . Alcohol use: Yes    Alcohol/week: 0.0 - 1.0 standard drinks    Comment: 0-1 per month  . Drug use: No  . Sexual activity: Not Currently  Other Topics Concern  . Not on file  Social History Narrative   Single.   Moved from [place] to [place].   Family lives in [place].   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   Social Determinants of Health   Financial Resource Strain:   . Difficulty of Paying Living Expenses: Not on file  Food Insecurity:   . Worried About Charity fundraiser in the Last Year: Not on file  . Ran Out of Food in the Last Year: Not on file  Transportation Needs:   . Lack of Transportation (Medical): Not on file  . Lack of Transportation (Non-Medical): Not on file  Physical Activity:   . Days of Exercise per Week: Not on file  . Minutes of Exercise per Session: Not on file  Stress:   . Feeling of Stress : Not on file   Social Connections:   . Frequency of Communication with Friends and Family: Not on file  . Frequency of Social Gatherings with Friends and Family: Not on file  . Attends Religious Services: Not on file  . Active Member of Clubs or Organizations: Not on file  . Attends Archivist Meetings: Not on file  . Marital Status: Not on file  Intimate Partner Violence:   . Fear of Current or Ex-Partner: Not on file  . Emotionally Abused: Not on file  . Physically Abused: Not on file  . Sexually Abused: Not on file   Current Outpatient Medications on File Prior to Visit  Medication Sig Dispense Refill  . albuterol (PROAIR HFA) 108 (90 Base) MCG/ACT inhaler Inhale 2 puffs into the lungs every 6 (six) hours as needed for wheezing or shortness of breath. NEED APPOINTMENT FOR ANY MORE REFILLS 18 g 0  . anastrozole (ARIMIDEX) 1 MG tablet Take 1 tablet (1 mg total) by mouth daily. 90 tablet 4  . atorvastatin (LIPITOR) 20 MG tablet TAKE 1 TABLET BY MOUTH EVERY DAY FOR CHOLESTEROL 90 tablet 3  . B-D UF III MINI PEN NEEDLES 31G X 5 MM MISC Use as instructed to inject insulin daily 300 each 2  . fluticasone (FLONASE) 50 MCG/ACT nasal spray USE 1 SPRAY IN BOTH  NOSTRILS TWICE DAILY AS  NEEDED FOR ALLERGIES OR  RHINITIS 48 g 2  . Fluticasone-Salmeterol (ADVAIR DISKUS) 250-50 MCG/DOSE AEPB Inhale 1 puff into the lungs 2 (two) times daily. 60 each 5  . insulin aspart (NOVOLOG FLEXPEN) 100 UNIT/ML FlexPen Inject 10 units before breakfast, 15 units before lunch and dinner. 15 mL 3  . Insulin Glargine (LANTUS SOLOSTAR) 100 UNIT/ML Solostar Pen Inject 45 Units into the skin daily. 45 mL 2  . Lancets (ONETOUCH DELICA PLUS 123XX123) MISC Inject 1 Device into the skin 3 (three) times daily. To check blood sugar 300 each 2  . metFORMIN (GLUCOPHAGE-XR) 500 MG 24 hr tablet TAKE 2 TABLETS BY MOUTH EVERY MORNING 180 tablet 3  . montelukast (SINGULAIR) 10 MG tablet Take 1 tablet (10 mg total) by mouth at bedtime. For  allergies and asthma. 90 tablet 3  . ONETOUCH VERIO test strip Use as instructed to check blood sugars 3 times daily 300 each 2  . WIXELA INHUB 500-50 MCG/DOSE AEPB TAKE 1 PUFF BY MOUTH TWICE A DAY 60 each 0   No current facility-administered medications on file prior to visit.   Allergies  Allergen Reactions  . 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 She described it was a small amount in a milk carton in [place]. She states they stopped using it shortly after because the trial wasn't successful. SPM   Family History  Problem Relation Age of Onset  . Alzheimer's disease Mother   . Asthma Mother   . Dementia Mother        cause of death  . Arthritis Father   . Heart attack Father 28       cause of death  . Breast cancer Sister 60  . Colon cancer Paternal Grandmother   . Liver cancer Maternal Uncle   . Colon polyps Neg Hx   . Rectal cancer Neg Hx   . Stomach cancer Neg Hx    PE: BP (!) 148/90   Pulse 86   Ht 5\' 5"  (1.651 m)   Wt 210 lb (95.3 kg)   SpO2 98%   BMI 34.95 kg/m  Wt Readings from Last 3 Encounters:  03/01/19 210 lb (95.3 kg)  02/08/19 208 lb 12 oz (94.7 kg)  01/06/19 206 lb 11.2 oz (93.8 kg)   Constitutional: overweight, in NAD Eyes: PERRLA, EOMI, no exophthalmos ENT: moist mucous membranes, no thyromegaly, no cervical lymphadenopathy Cardiovascular: RRR, No MRG Respiratory: CTA B Gastrointestinal: abdomen soft, NT,  ND, BS+ Musculoskeletal: no deformities, strength intact in all 4 Skin: moist, warm, no rashes Neurological: no tremor with outstretched hands, DTR normal in all 4  ASSESSMENT: 1. DM2, insulin-dependent, uncontrolled, without long-term complications  PLAN:  1. Patient with long-standing, uncontrolled diabetes, on oral antidiabetic regimen + basal-bolus insulin regimen, which became insufficient.  We reviewed together her latest HbA1c which was elevated, at 9.1% but  slightly improved from before. -Reviewing her detailed sugar log, it appears that her CBGs are very fluctuating.  There is no clear pattern.  Upon questioning, she is taking the Lantus in the morning and then takes NovoLog before meals only if the sugars are higher than 150.  We discussed that she will need to take NovoLog whenever she eats, even if the sugars are at goal (unless they are lower than 60, when she can skip the insulin for safety).  I believe that the reason why she is intolerant to higher doses of NovoLog is the fact that she is on too much Lantus.  I advised him to reduce the Lantus dose by 10 units.  We will also use lower doses of NovoLog just to make sure we avoid low blood sugars by any means. -Also, she is correcting high blood sugars at that time (she does not have a certain threshold) with of home mealtime insulin dose (15 units of NovoLog).  I strongly advised her to avoid correcting with this much insulin and to only bolus up to 5 units of NovoLog and only if sugars are higher than 250. -To help her more mealtime CBG excursions, I suggested to start Ozempic.  We will start at a low dose and advance as tolerated.  Discussed about benefits and possible side effects.  She does not have a family history of medullary thyroid cancer or personal history of pancreatitis.  I am hoping that her insurance will cover the Ozempic. -Due to her significant low and high blood sugars, it is safer for her to have a CGM.  I explained the concept and sent a prescription for the Dexcom CGM to her mail order pharmacy. 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  However, management would not be much different if she does turn out to have this, since she will probably need to continue on insulin. -We also discussed about the idea of insulin resistance and, since she was very interested in how she needed to change her diet to improve her diabetes, I  suggested a plant-based diet.  I explained why diet high in fat will worsen her blood sugars and, ultimately, her insulin resistance.  I gave her references about the diet. - I suggested to:  Patient Instructions  Please try to get the Dexcom CGM.  Please decrease: - Lantus to 35 units in am - Novolog to 9-12 units before 15 min before meals If sugars before meals <60, do not take Novolog before that meal. If sugars before meals <90, take 50% of the Novolog before that meal  At bedtime, only correct sugars >250, and only with up to 5 units of Novolog.  Please start Ozempic 0.25 mg weekly in a.m. (for example on Sunday morning) x 4 weeks, then increase to 0.5 mg weekly in a.m. if no nausea or hypoglycemia.  Please watch:  Forks over Ong  - Strongly advised her to start checking sugars at different times of the day - check 4x  a day, rotating checks - discussed about CBG targets for treatment: 80-130 mg/dL before meals and <180 mg/dL after meals; target HbA1c <7%. - given sugar log and advised how to fill it and to bring it at next appt  - given foot care handout and explained the principles  - We reviewed in detail the hypoglycemia management "15-15 rule"  - advised for yearly eye exams  - Return to clinic in 3 mo with sugar log   While in the office, patient felt hypoglycemic and a CBG was in the 60s.  She was advised to get glucose tablets and we also gave her juice to take with her.  Before she left her sugars increase to 78.  - time spent with the patient: 1 hour, of which >50% was spent in obtaining information about her symptoms, reviewing her previous labs, evaluations, and treatments, counseling her about her diabetes, also on nutrition (please see the discussed topics above), and developing a plan to further investigate and treat it; she had a number of questions which I addressed.   Philemon Kingdom, MD PhD [facility] Endocrinology

## 2019-03-01 NOTE — Patient Instructions (Addendum)
Please try to get the Dexcom CGM.  Please decrease: - Lantus to 35 units in am - Novolog to 9-12 units before 15 min before meals If sugars before meals <60, do not take Novolog before that meal. If sugars before meals <90, take 50% of the Novolog before that meal  At bedtime, only correct sugars >250, and only with up to 5 units of Novolog.  Please start Ozempic 0.25 mg weekly in a.m. (for example on Sunday morning) x 4 weeks, then increase to 0.5 mg weekly in a.m. if no nausea or hypoglycemia.  Please watch:  [place] over [place] 2 DIABETES:  DIET AND EXERCISE Diet and exercise is an important part of diabetic treatment.  We recommended aerobic exercise in the form of brisk walking (working between 40-60% of maximal aerobic capacity, similar to brisk walking) for 150 minutes per week (such as 30 minutes five days per week) along with 3 times per week performing 'resistance' training (using various gauge rubber tubes with handles) 5-10 exercises involving the major muscle groups (upper body, lower body and core) performing 10-15 repetitions (or near fatigue) each exercise. Start at half the above goal but build slowly to reach the above goals. If limited by weight, joint pain, or disability, we recommend daily walking in a swimming pool with water up to waist to reduce pressure from joints while allow for adequate exercise.    BLOOD GLUCOSES Monitoring your blood glucoses is important for continued management of your diabetes. Please check your blood glucoses 2-4 times a day: fasting, before meals and at bedtime (you can rotate these measurements - e.g. one day check before the 3 meals, the next day check before 2 of the meals and before bedtime, etc.).   HYPOGLYCEMIA (low blood sugar) Hypoglycemia is usually a reaction to not eating, exercising, or taking too much insulin/ other diabetes drugs.  Symptoms include tremors, sweating, hunger,  confusion, headache, etc. Treat IMMEDIATELY with 15 grams of Carbs: . 4 glucose tablets .  cup regular juice/soda . 2 tablespoons raisins . 4 teaspoons sugar . 1 tablespoon honey Recheck blood glucose in 15 mins and repeat above if still symptomatic/blood glucose <100.  RECOMMENDATIONS TO REDUCE YOUR RISK OF DIABETIC COMPLICATIONS: * Take your prescribed MEDICATION(S) * Follow a DIABETIC diet: Complex carbs, fiber rich foods, (monounsaturated and polyunsaturated) fats * AVOID saturated/trans fats, high fat foods, >2,300 mg salt per day. * EXERCISE at least 5 times a week for 30 minutes or preferably daily.  * DO NOT SMOKE OR DRINK more than 1 drink a day. * Check your FEET every day. Do not wear tightfitting shoes. Contact us if you develop an ulcer * See your EYE doctor once a year or more if needed * Get a FLU shot once a year * Get a PNEUMONIA vaccine once before and once after age 58 years  GOALS:  * Your Hemoglobin A1c of <7%  * fasting sugars need to be <130 * after meals sugars need to be <180 (2h after you start eating) * Your Systolic BP should be [id number] or lower  * Your Diastolic BP should be 80 or lower  * Your HDL (Good Cholesterol) should be 40 or higher  * Your LDL (Bad Cholesterol) should be 100 or lower. * Your Triglycerides should be 150 or lower  * Your Urine microalbumin (kidney function) should be <30 * Your Body Mass Index should be 25 or lower  Please consider the following ways to cut down carbs and fat and increase fiber and micronutrients in your diet: - substitute whole grain for white bread or pasta - substitute brown rice for white rice - substitute 90-calorie flat bread pieces for slices of bread when possible - substitute sweet potatoes or yams for white potatoes - substitute humus for margarine - substitute tofu for cheese when possible - substitute almond or rice milk for regular milk (would not drink soy milk daily due to concern for soy  estrogen influence on breast cancer risk) - substitute dark chocolate for other sweets when possible - substitute water - can add lemon or orange slices for taste - for diet sodas (artificial sweeteners will trick your body that you can eat sweets without getting calories and will lead you to overeating and weight gain in the long run) - do not skip breakfast or other meals (this will slow down the metabolism and will result in more weight gain over time)  - can try smoothies made from fruit and almond/rice milk in am instead of regular breakfast - can also try old-fashioned (not instant) oatmeal made with almond/rice milk in am - order the dressing on the side when eating salad at a restaurant (pour less than half of the dressing on the salad) - eat as little meat as possible - can try juicing, but should not forget that juicing will get rid of the fiber, so would alternate with eating raw veg./fruits or drinking smoothies - use as little oil as possible, even when using olive oil - can dress a salad with a mix of balsamic vinegar and lemon juice, for e.g. - use agave nectar, stevia sugar, or regular sugar rather than artificial sweateners - steam or broil/roast veggies  - snack on veggies/fruit/nuts (unsalted, preferably) when possible, rather than processed foods - reduce or eliminate aspartame in diet (it is in diet sodas, chewing gum, etc) Read the labels!  Try to read Dr. Janene Harvey book: "Program for Reversing Diabetes" for other ideas for healthy eating.

## 2019-03-02 DIAGNOSIS — M5412 Radiculopathy, cervical region: Secondary | ICD-10-CM | POA: Diagnosis not present

## 2019-03-02 DIAGNOSIS — M5416 Radiculopathy, lumbar region: Secondary | ICD-10-CM | POA: Diagnosis not present

## 2019-03-02 DIAGNOSIS — M545 Low back pain: Secondary | ICD-10-CM | POA: Diagnosis not present

## 2019-03-02 DIAGNOSIS — M542 Cervicalgia: Secondary | ICD-10-CM | POA: Diagnosis not present

## 2019-03-05 ENCOUNTER — Other Ambulatory Visit: Payer: Self-pay | Admitting: Oncology

## 2019-03-05 ENCOUNTER — Other Ambulatory Visit: Payer: Self-pay | Admitting: Primary Care

## 2019-03-05 DIAGNOSIS — E119 Type 2 diabetes mellitus without complications: Secondary | ICD-10-CM

## 2019-03-05 DIAGNOSIS — Z794 Long term (current) use of insulin: Secondary | ICD-10-CM

## 2019-03-09 ENCOUNTER — Telehealth: Payer: Self-pay

## 2019-03-09 NOTE — Telephone Encounter (Signed)
Pt called stating she is having too many lows and would like to discuss her CBG's and medications to better determine what new dosage she should be taking.

## 2019-03-21 ENCOUNTER — Telehealth: Payer: Self-pay

## 2019-03-21 NOTE — Telephone Encounter (Signed)
Order was sent to [facility] for a Dexcom CGM, they have canceled the order due to the patient not responding to them.

## 2019-03-25 DIAGNOSIS — G4733 Obstructive sleep apnea (adult) (pediatric): Secondary | ICD-10-CM | POA: Diagnosis not present

## 2019-03-28 ENCOUNTER — Other Ambulatory Visit: Payer: BC Managed Care – PPO

## 2019-03-29 ENCOUNTER — Ambulatory Visit: Payer: BC Managed Care – PPO

## 2019-03-29 ENCOUNTER — Other Ambulatory Visit: Payer: Self-pay | Admitting: Primary Care

## 2019-03-29 DIAGNOSIS — M5416 Radiculopathy, lumbar region: Secondary | ICD-10-CM | POA: Diagnosis not present

## 2019-03-29 DIAGNOSIS — M545 Low back pain: Secondary | ICD-10-CM | POA: Diagnosis not present

## 2019-03-29 DIAGNOSIS — E039 Hypothyroidism, unspecified: Secondary | ICD-10-CM

## 2019-03-29 DIAGNOSIS — M5412 Radiculopathy, cervical region: Secondary | ICD-10-CM | POA: Diagnosis not present

## 2019-03-29 DIAGNOSIS — M542 Cervicalgia: Secondary | ICD-10-CM | POA: Diagnosis not present

## 2019-03-29 MED ORDER — LEVOTHYROXINE SODIUM 88 MCG PO TABS: ORAL_TABLET | ORAL | 1 refills | Status: DC

## 2019-04-06 DIAGNOSIS — M542 Cervicalgia: Secondary | ICD-10-CM | POA: Diagnosis not present

## 2019-04-06 DIAGNOSIS — M5412 Radiculopathy, cervical region: Secondary | ICD-10-CM | POA: Diagnosis not present

## 2019-04-06 DIAGNOSIS — M5416 Radiculopathy, lumbar region: Secondary | ICD-10-CM | POA: Diagnosis not present

## 2019-04-06 DIAGNOSIS — M545 Low back pain: Secondary | ICD-10-CM | POA: Diagnosis not present

## 2019-04-07 ENCOUNTER — Other Ambulatory Visit: Payer: Self-pay | Admitting: Primary Care

## 2019-04-07 DIAGNOSIS — E039 Hypothyroidism, unspecified: Secondary | ICD-10-CM

## 2019-04-08 ENCOUNTER — Telehealth: Payer: Self-pay

## 2019-04-08 NOTE — Telephone Encounter (Signed)
Patient was approved for the Dexcom CGM but it is not affordable for her.

## 2019-04-16 ENCOUNTER — Other Ambulatory Visit: Payer: Self-pay | Admitting: Primary Care

## 2019-04-16 DIAGNOSIS — Z794 Long term (current) use of insulin: Secondary | ICD-10-CM

## 2019-04-16 DIAGNOSIS — E1165 Type 2 diabetes mellitus with hyperglycemia: Secondary | ICD-10-CM

## 2019-04-18 ENCOUNTER — Telehealth: Payer: Self-pay | Admitting: Internal Medicine

## 2019-04-18 NOTE — Telephone Encounter (Signed)
Patient requests to be called at ph# [phone] re: Patient had a dizzy spell and blood sugar levels being high over the weekend (Currently 171-before lunch today-149  Before breakfast today). Patient had blood sugar level over 400 at the highest point during dizzy spell episode over the weekend.This is the first time patient has been dizzy. Please call patient to advise.

## 2019-04-19 NOTE — Telephone Encounter (Signed)
I am not sure what happened... If sugars improving, continue to watch them and let me know if there ae any trends. She may need to take a pic of her log and send it through [employer]

## 2019-04-25 DIAGNOSIS — G4733 Obstructive sleep apnea (adult) (pediatric): Secondary | ICD-10-CM | POA: Diagnosis not present

## 2019-04-27 ENCOUNTER — Other Ambulatory Visit: Payer: Self-pay | Admitting: Primary Care

## 2019-04-27 DIAGNOSIS — J4520 Mild intermittent asthma, uncomplicated: Secondary | ICD-10-CM

## 2019-04-27 MED ORDER — MONTELUKAST SODIUM 10 MG PO TABS: 10.0000 mg | ORAL_TABLET | Freq: Every day | ORAL | 1 refills | Status: DC

## 2019-04-29 ENCOUNTER — Other Ambulatory Visit: Payer: Self-pay | Admitting: Primary Care

## 2019-04-29 DIAGNOSIS — J4520 Mild intermittent asthma, uncomplicated: Secondary | ICD-10-CM

## 2019-05-02 ENCOUNTER — Telehealth: Payer: Self-pay

## 2019-05-02 NOTE — Telephone Encounter (Signed)
Form for Continous Glucose Monitor and Certificate/Letter of Medical Neccescity have been received, completed, and signed by Dr. Gherghe.  Completed form has been faxed to [facility] with confirmation.  

## 2019-05-03 ENCOUNTER — Other Ambulatory Visit: Payer: Self-pay

## 2019-05-04 NOTE — Patient Instructions (Addendum)
Please continue: - Metformin ER 1000 mg with breakfast - Lantus to 35 units in am  Please increase: - Novolog 7-9 units before 15 min before meals (may increase to 9-12 units if sugars are still high after increasing Ozempic) If sugars before meals <60, do not take Novolog before that meal. If sugars before meals <90, take 50% of the Novolog before that meal At bedtime, only correct sugars >250, and only with up to 5 units of Novolog.  Please also increase: - Ozempic to 1 mg weekly  Please return in 3 months with your sugar log.

## 2019-05-04 NOTE — Progress Notes (Signed)
Patient ID: Molly Wall, female   DOB: [birth year], 74 y.o.   MRN: [id number]   Patient location: Home My location: home Persons participating in the virtual visit: patient, provider  Referring Provider: [name], NP  I connected with the patient on 05/04/19 at 11:24 AM EST by telephone and verified that I am speaking with the correct person.   I discussed the limitations of evaluation and management by telephone and the availability of in person appointments. The patient expressed understanding and agreed to proceed.   Details of the encounter are shown below.  HPI: Molly Wall is a 74 y.o.-year-old female, initially referred by her PCP, Alma Friendly, MD, returning for follow-up for DM2, dx at 69 (2017), h/o reactive hypoglycemia, insulin-dependent, uncontrolled, without long-term complications.  Last visit 3 months ago.  Reviewed HbA1c levels:  Lab Results  Component Value Date   HGBA1C 9.1 (A) 02/08/2019   HGBA1C 9.6 (A) 10/27/2018   HGBA1C 8.2 (A) 07/30/2018   HGBA1C 11.1 (A) 04/27/2018   HGBA1C 11.3 (H) 12/09/2017   HGBA1C 6.8 (H) 04/10/2016   HGBA1C 6.6 (H) 01/09/2016   HGBA1C 6.4 07/16/2015   At last visit she was on: - Metformin ER 1000 mg 1x a day, with breakfast - Lantus 45 units in am  - Novolog 12-24-08 >> 12-29-13 units 3x a day, before meals - If sugars are >150 only. At bedtime, may take 15 units if sugars are high.  We changed to: - Metformin ER 1000 mg with breakfast - Ozempic 0.5 mg weekly - Lantus 35 units in am - Novolog 9-12 >> she decreased 5-7 units  before 15 min before meals If sugars before meals <60, do not take Novolog before that meal. If sugars before meals <90, take 50% of the Novolog before that meal At bedtime, only correct sugars >250, and only with up to 5 units of Novolog.  Pt checks her sugars 4 times a day: - am: 82-374 >> 141-239, 333 - 2h after b'fast: 240 >> n/c - before lunch: 70-240, 376 >> 84-269 - 2h  after lunch: 100-126 >> n/c - before dinner: 87-448, 578 >> 100-306, 419 - 2h after dinner: 267, 289 >> n/c - bedtime:55, 166,  286 >> 191-436 (ave 200s) - nighttime: n/c Lowest sugar was 55 >> 84; she has hypoglycemia awareness in the 80s. Highest sugar was 578  >>  436.  Glucometer: One Touch Verio  Pt's meals are: - Breakfast: Apple + peanut butter; English muffin/cream cheese - Lunch: Soup (homemade) or 1 meat + 2 veggies - Dinner: 1 meat + 2 veggies or stew + bread - Snacks: 1 snack at bedtime-nuts  -No CKD, last BUN/creatinine:  Lab Results  Component Value Date   BUN 21 01/06/2019   BUN 17 12/30/2018   CREATININE 0.82 01/06/2019   CREATININE 0.71 12/30/2018  She is not on ACE inhibitor or ARB.  -No HL; last set of lipids: Lab Results  Component Value Date   CHOL 158 04/27/2018   HDL 54.50 04/27/2018   LDLCALC 88 04/27/2018   TRIG 75.0 04/27/2018   CHOLHDL 3 04/27/2018  On Lipitor 20.  - last eye exam was in 01/2018: No DR  - no numbness and tingling in her feet.  Pt has FH of DM in [place].  She has OSA and started CPAP just before last visit >> she feels much better after starting this.  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  She also has a h/o 74 thyrotoxicosis very young. 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  She is on levothyroxine 88 mcg daily except 1.5 tabs 1/7 days.    Last TSH was slightly elevated: Lab Results  Component Value Date   TSH 5.47 (H) 12/30/2018   She lives alone.  ROS: Constitutional: + weight gain/no weight loss, no fatigue, no subjective hyperthermia, no subjective hypothermia Eyes: no blurry vision, no xerophthalmia ENT: no sore throat, no nodules palpated in neck, no dysphagia, no odynophagia, no hoarseness Cardiovascular: no CP/no SOB/no palpitations/no leg swelling Respiratory: no cough/no SOB/no wheezing Gastrointestinal: no N/no V/no D/no C/no acid  reflux Musculoskeletal: no muscle aches/no joint aches Skin: no rashes, no hair loss Neurological: no tremors/no numbness/no tingling/no dizziness  I reviewed pt's medications, allergies, PMH, social hx, family hx, and changes were documented in the history of present illness. Otherwise, unchanged from my initial visit note.  Past Medical History:  Diagnosis Date  . Allergic rhinitis   . Allergy   . Asthma   . Atypical ductal hyperplasia of right breast   . 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 Breast cancer ([place])   . Diabetes mellitus without complication ([place])   . Family history of breast cancer   . Family history of colon cancer   . Hypercholesteremia   . Hypothyroidism   . Lumbar herniated disc   . OSA (obstructive sleep apnea)    used to use CPAP, not any more over 1 1/2 yrs  . Salivary stone    left side  . Sleep apnea    no cpap in 2 yrs   . Vitamin D deficiency    Past Surgical History:  Procedure Laterality Date  . APPENDECTOMY    . BREAST LUMPECTOMY WITH RADIOACTIVE SEED LOCALIZATION Right 08/12/2017   Procedure: RIGHT BREAST LUMPECTOMY X'S 2 WITH RADIOACTIVE SEED LOCALIZATION X'S 2;  Surgeon: Fanny Skates, MD;  Location: [facility];  Service: General;  Laterality: Right;  . COLONOSCOPY     11 yrs ago in [place] but given a 10 yr recall per pt   . GANGLION CYST EXCISION     74 years old  . INCISION AND DRAINAGE ABSCESS ANAL    . KNEE SURGERY  2014  . SHOULDER SURGERY  1976   Social History   Socioeconomic History  . Marital status: Divorced    Spouse name: Not on file  . Number of children: 2  . Years of education: Not on file  . Highest education level: Not on file  Occupational History  . Not on file  Tobacco Use  . Smoking status: Former Smoker    Packs/day: 1.00    Years: 2.00    Pack years: 2.00    Types: Cigarettes  . Smokeless tobacco: Never Used  . Tobacco comment: quite smoker at age 74   Substance and Sexual Activity  . Alcohol use: Yes    Alcohol/week: 0.0 - 1.0 standard drinks    Comment: 0-1 per month  . Drug use: No  . Sexual activity: Not Currently  Other Topics Concern  . Not on file  Social History Narrative   Single.   Moved from [place] to [place].   Family lives in [place].   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   Social Determinants of Health  Financial Resource Strain:   . Difficulty of Paying Living Expenses: Not on file  Food Insecurity:   . Worried About Charity fundraiser in the Last Year: Not on file  . Ran Out of Food in the Last Year: Not on file  Transportation Needs:   . Lack of Transportation (Medical): Not on file  . Lack of Transportation (Non-Medical): Not on file  Physical Activity:   . Days of Exercise per Week: Not on file  . Minutes of Exercise per Session: Not on file  Stress:   . Feeling of Stress : Not on file  Social Connections:   . Frequency of Communication with Friends and Family: Not on file  . Frequency of Social Gatherings with Friends and Family: Not on file  . Attends Religious Services: Not on file  . Active Member of Clubs or Organizations: Not on file  . Attends Archivist Meetings: Not on file  . Marital Status: Not on file  Intimate Partner Violence:   . Fear of Current or Ex-Partner: Not on file  . Emotionally Abused: Not on file  . Physically Abused: Not on file  . Sexually Abused: Not on file   Current Outpatient Medications on File Prior to Visit  Medication Sig Dispense Refill  . albuterol (PROAIR HFA) 108 (90 Base) MCG/ACT inhaler Inhale 2 puffs into the lungs every 6 (six) hours as needed for wheezing or shortness of breath. NEED APPOINTMENT FOR ANY MORE REFILLS 18 g 0  . anastrozole (ARIMIDEX) 1 MG tablet TAKE 1 TABLET BY MOUTH EVERY DAY 30 tablet 14  . atorvastatin (LIPITOR) 20 MG tablet TAKE 1  TABLET BY MOUTH EVERY DAY FOR CHOLESTEROL 90 tablet 3  . B-D UF III MINI PEN NEEDLES 31G X 5 MM MISC Use as instructed to inject insulin daily 300 each 2  . Continuous Blood Gluc Transmit (DEXCOM G6 TRANSMITTER) MISC 1 Device by Does not apply route every 3 (three) months. 1 each 3  . fluticasone (FLONASE) 50 MCG/ACT nasal spray USE 1 SPRAY IN BOTH  NOSTRILS TWICE DAILY AS  NEEDED FOR ALLERGIES OR  RHINITIS 48 g 2  . Fluticasone-Salmeterol (ADVAIR DISKUS) 250-50 MCG/DOSE AEPB Inhale 1 puff into the lungs 2 (two) times daily. 60 each 5  . insulin aspart (NOVOLOG FLEXPEN) 100 UNIT/ML FlexPen Inject 9-12 units before meals 15 mL 3  . Insulin Glargine (LANTUS SOLOSTAR) 100 UNIT/ML Solostar Pen Inject 35 Units into the skin daily. 45 mL 2  . Lancets (ONETOUCH DELICA PLUS 123XX123) MISC Inject 1 Device into the skin 3 (three) times daily. To check blood sugar 300 each 2  . levothyroxine (SYNTHROID) 88 MCG tablet TAKE 1 AND 1/2 TABLET BY MOUTH EVERY SUNDAY. TAKE 1 TABLET BY MOUTH MON THROUGH SAT. TAKE ON AN EMPTY STOMACH WITH WATER ONLY. NO FOOD OR OTHER MEDICATIONS FOR 30 MINUTES. 92 tablet 1  . metFORMIN (GLUCOPHAGE-XR) 500 MG 24 hr tablet TAKE 2 TABLETS BY MOUTH EVERY MORNING 180 tablet 3  . montelukast (SINGULAIR) 10 MG tablet Take 1 tablet (10 mg total) by mouth at bedtime. For allergies and asthma. 90 tablet 1  . ONETOUCH VERIO test strip USE AS DIRECTED TO CHECK FASTING BLOOD SUGARS THREE TIMES DAILY. Insulin Dependent Dx Code E11.9 100 strip 10  . Semaglutide,0.25 or 0.5MG /DOS, (OZEMPIC, 0.25 OR 0.5 MG/DOSE,) 2 MG/1.5ML SOPN Inject 0.5 mg into the skin once a week. 2 pen 5  . WIXELA INHUB 500-50 MCG/DOSE AEPB TAKE 1 PUFF BY MOUTH  TWICE A DAY 60 each 0   No current facility-administered medications on file prior to visit.   Allergies  Allergen Reactions  . 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 She described it was a  small amount in a milk carton in [place]. She states they stopped using it shortly after because the trial wasn't successful. SPM   Family History  Problem Relation Age of Onset  . Alzheimer's disease Mother   . Asthma Mother   . Dementia Mother        cause of death  . Arthritis Father   . Heart attack Father 67       cause of death  . Breast cancer Sister 13  . Colon cancer Paternal Grandmother   . Liver cancer Maternal Uncle   . Colon polyps Neg Hx   . Rectal cancer Neg Hx   . Stomach cancer Neg Hx    PE: There were no vitals taken for this visit. Wt Readings from Last 3 Encounters:  03/01/19 210 lb (95.3 kg)  02/08/19 208 lb 12 oz (94.7 kg)  01/06/19 206 lb 11.2 oz (93.8 kg)   Constitutional:  in NAD  The physical exam was not performed (telephone visit).  ASSESSMENT: 1. DM2, insulin-dependent, uncontrolled, without long-term complications  2. HL  3. Obesity class 1  PLAN:  1. Patient with longstanding, uncontrolled, type 2 diabetes, on basal-bolus insulin regimen and also weekly GLP-1 receptor agonist added at last visit.  At last visit her sugars were very fluctuating per review of her detailed sugar log, without a clear pattern.  She was taking Lantus in the morning and was taking NovoLog before meals only if the sugars were higher than 150.  We discussed that she needed to take NovoLog whenever she eats even if the sugars were at goal, but I did give her some instructions about when to take a lower dose and when to skip NovoLog completely.  As we added NovoLog consistently before meals, I advised her to decrease the Lantus by 10 units.  I also suggested Ozempic to help her more with her postprandial hyperglycemia.  At last visit we also discussed about a plant-based diet, in which she was very interested.  I gave her references about this. -At last visit I also suggested the CGM since her blood sugars are very fluctuating, to improve her safety.  I sent a prescription  for the Dexcom CGM to her pharmacy. -We will need to check her for insulin deficiency when she returns to the clinic. -at this visit, we extend her significant amount of time reviewing her blood sugars at home and also her medication regimen.  She has not been taking the recommended NovoLog doses, but a lower amount.  Subsequently, sugars remain high, although they are better later in the day.  I advised her to increase her NovoLog doses slightly for now - she is tolerating Ozempic well and she is complaining about weight gain, so I advised her to increase her dose to the maximum of 1 mg weekly.  If the sugars remain high after this change, I advised her to go ahead and increase the dose of NovoLog we will further.  Unfortunately, she forgot to send it for the last 2 weeks so for now I advised her to start with 0.5 mg weekly and then increase to 1 mg weekly for 4 doses. -For now we will continue the same  dose of Metformin and Lantus -No significant lows since last visit, but she did have some sugars in the 400s. - I suggested to:  Patient Instructions  Please continue: - Metformin ER 1000 mg with breakfast - Lantus to 35 units in am  Please increase: - Novolog 7-9 units before 15 min before meals (may increase to 9-12 units if sugars are still high after increasing Ozempic) If sugars before meals <60, do not take Novolog before that meal. If sugars before meals <90, take 50% of the Novolog before that meal At bedtime, only correct sugars >250, and only with up to 5 units of Novolog.  Please also increase: - Ozempic to 1 mg weekly  Please return in 3 months with your sugar log.   - we will check her HbA1c when she returns to the clinic - advised to check sugars at different times of the day - 4x a day, rotating check times - advised for yearly eye exams >> she is not UTD - return to clinic in 3-4 months  2. HL - Reviewed latest lipid panel from 02/2019: All fractions at goal Lab Results   Component Value Date   CHOL 158 04/27/2018   HDL 54.50 04/27/2018   LDLCALC 88 04/27/2018   TRIG 75.0 04/27/2018   CHOLHDL 3 04/27/2018  - Continues Lipitor without side effects.  3.  Obesity class I -Continue Ozempic which should also help with weight loss, especially with increased dose -she gained ~10 lbs since last OV  - time spent with the patient: 25 min, of which >50% was spent in obtaining information about her diabetes, reviewing her previous labs, evaluations, diabetic regimen, counseling her about her endocrine condition (please see the discussed topics above), and developing a plan to further treat them.   Philemon Kingdom, MD PhD [facility]

## 2019-05-05 ENCOUNTER — Ambulatory Visit: Payer: BC Managed Care – PPO | Admitting: Internal Medicine

## 2019-05-05 ENCOUNTER — Encounter: Payer: Self-pay | Admitting: Internal Medicine

## 2019-05-05 ENCOUNTER — Telehealth: Payer: Self-pay | Admitting: Internal Medicine

## 2019-05-05 ENCOUNTER — Ambulatory Visit (INDEPENDENT_AMBULATORY_CARE_PROVIDER_SITE_OTHER): Payer: BC Managed Care – PPO | Admitting: Internal Medicine

## 2019-05-05 DIAGNOSIS — E669 Obesity, unspecified: Secondary | ICD-10-CM

## 2019-05-05 DIAGNOSIS — Z794 Long term (current) use of insulin: Secondary | ICD-10-CM | POA: Diagnosis not present

## 2019-05-05 DIAGNOSIS — E1165 Type 2 diabetes mellitus with hyperglycemia: Secondary | ICD-10-CM | POA: Diagnosis not present

## 2019-05-05 DIAGNOSIS — E785 Hyperlipidemia, unspecified: Secondary | ICD-10-CM | POA: Diagnosis not present

## 2019-05-05 MED ORDER — OZEMPIC (1 MG/DOSE) 2 MG/1.5ML ~~LOC~~ SOPN: 1.0000 mg | PEN_INJECTOR | SUBCUTANEOUS | 3 refills | Status: DC

## 2019-05-05 NOTE — Telephone Encounter (Signed)
appt is set for 3 mon fu

## 2019-05-05 NOTE — Telephone Encounter (Signed)
atc pt to set up her fu from todays doxy visit no answer and no vm pick up sending message to pt via mychart

## 2019-05-10 ENCOUNTER — Encounter: Payer: Self-pay | Admitting: Internal Medicine

## 2019-05-10 DIAGNOSIS — M542 Cervicalgia: Secondary | ICD-10-CM | POA: Diagnosis not present

## 2019-05-10 DIAGNOSIS — M545 Low back pain: Secondary | ICD-10-CM | POA: Diagnosis not present

## 2019-05-10 DIAGNOSIS — M5416 Radiculopathy, lumbar region: Secondary | ICD-10-CM | POA: Diagnosis not present

## 2019-05-10 DIAGNOSIS — M5412 Radiculopathy, cervical region: Secondary | ICD-10-CM | POA: Diagnosis not present

## 2019-05-17 ENCOUNTER — Other Ambulatory Visit: Payer: Self-pay | Admitting: Primary Care

## 2019-05-17 ENCOUNTER — Encounter: Payer: Self-pay | Admitting: Podiatry

## 2019-05-17 ENCOUNTER — Other Ambulatory Visit: Payer: Self-pay

## 2019-05-17 ENCOUNTER — Ambulatory Visit (INDEPENDENT_AMBULATORY_CARE_PROVIDER_SITE_OTHER): Payer: BC Managed Care – PPO

## 2019-05-17 ENCOUNTER — Ambulatory Visit: Payer: BC Managed Care – PPO | Admitting: Podiatry

## 2019-05-17 DIAGNOSIS — M722 Plantar fascial fibromatosis: Secondary | ICD-10-CM

## 2019-05-17 DIAGNOSIS — Z1231 Encounter for screening mammogram for malignant neoplasm of breast: Secondary | ICD-10-CM

## 2019-05-17 MED ORDER — MELOXICAM 15 MG PO TABS: 15.0000 mg | ORAL_TABLET | Freq: Every day | ORAL | 1 refills | Status: DC

## 2019-05-19 NOTE — Progress Notes (Signed)
   Subjective: 74 y.o. female presenting today as a new patient with a chief complaint of stabbing pain to the right heel that began two months ago. She reports associated pain in the arch of the foot. She states the pain is worse in the morning and when she stands after being seated for a long period of time. She has been using a plantar fascial brace, using ice and heat therapy and taking Aleve for treatment. Patient is here for further evaluation and treatment.   Past Medical History:  Diagnosis Date  . Allergic rhinitis   . Allergy   . Asthma   . Atypical ductal hyperplasia of right breast   . 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 Breast cancer ([place])   . Diabetes mellitus without complication ([place])   . Family history of breast cancer   . Family history of colon cancer   . Hypercholesteremia   . Hypothyroidism   . Lumbar herniated disc   . OSA (obstructive sleep apnea)    used to use CPAP, not any more over 1 1/2 yrs  . Salivary stone    left side  . Sleep apnea    no cpap in 2 yrs   . Vitamin D deficiency      Objective: Physical Exam General: The patient is alert and oriented x3 in no acute distress.  Dermatology: Skin is warm, dry and supple bilateral lower extremities. Negative for open lesions or macerations bilateral.   Vascular: Dorsalis Pedis and Posterior Tibial pulses palpable bilateral.  Capillary fill time is immediate to all digits.  Neurological: Epicritic and protective threshold intact bilateral.   Musculoskeletal: Tenderness to palpation to the plantar aspect of the right heel along the plantar fascia. All other joints range of motion within normal limits bilateral. Strength 5/5 in all groups bilateral.   Radiographic exam: Normal osseous mineralization. Joint spaces preserved. No fracture/dislocation/boney destruction. No other soft tissue abnormalities or radiopaque foreign bodies.   Assessment: 1. Plantar fasciitis right  Plan  of Care:  1. Patient evaluated. Xrays reviewed.   2. Injection of 0.5cc Celestone soluspan injected into the right plantar fascia  3. Prescription for Meloxicam provided to patient. 4. Plantar fascial brace dispensed.  5. Night splint dispensed.  6. Instructed patient regarding therapies and modalities at home to alleviate symptoms.  7. Appointment with Liliane Channel, Pedorthist, for custom molded orthotics.  8. Return to clinic in 4 weeks.     Edrick Kins, DPM [facility]  Dr. Edrick Kins, DPM    [address], [place] [address]                Office [phone]  Fax [phone]

## 2019-05-23 DIAGNOSIS — G4733 Obstructive sleep apnea (adult) (pediatric): Secondary | ICD-10-CM | POA: Diagnosis not present

## 2019-05-30 ENCOUNTER — Encounter: Payer: Self-pay | Admitting: Internal Medicine

## 2019-05-30 ENCOUNTER — Telehealth: Payer: Self-pay | Admitting: Primary Care

## 2019-05-30 DIAGNOSIS — J45909 Unspecified asthma, uncomplicated: Secondary | ICD-10-CM

## 2019-05-30 NOTE — Telephone Encounter (Signed)
Pt states her albuterol has expired and she is wondering if she can get a ref[facility] for [facility] [place] because she likes to keep one at home, one in her car, and one in her purse so she has one at all times.

## 2019-05-31 MED ORDER — ALBUTEROL SULFATE HFA 108 (90 BASE) MCG/ACT IN AERS: 2.0000 | INHALATION_SPRAY | Freq: Four times a day (QID) | RESPIRATORY_TRACT | 0 refills | Status: DC | PRN

## 2019-05-31 NOTE — Telephone Encounter (Signed)
Last prescribed on 03/09/2017 . Last appointment on 02/08/2019  . Next future appointment

## 2019-05-31 NOTE — Telephone Encounter (Signed)
Noted. That's fine, prescription sent to pharmacy.

## 2019-05-31 NOTE — Addendum Note (Signed)
Addended by: [name] on: 05/31/2019 12:46 PM   Modules accepted: Orders

## 2019-06-01 ENCOUNTER — Other Ambulatory Visit: Payer: Self-pay | Admitting: Primary Care

## 2019-06-01 DIAGNOSIS — Z794 Long term (current) use of insulin: Secondary | ICD-10-CM

## 2019-06-01 DIAGNOSIS — E119 Type 2 diabetes mellitus without complications: Secondary | ICD-10-CM

## 2019-06-01 DIAGNOSIS — E785 Hyperlipidemia, unspecified: Secondary | ICD-10-CM

## 2019-06-04 ENCOUNTER — Other Ambulatory Visit: Payer: Self-pay | Admitting: Primary Care

## 2019-06-04 DIAGNOSIS — E039 Hypothyroidism, unspecified: Secondary | ICD-10-CM

## 2019-06-06 ENCOUNTER — Other Ambulatory Visit: Payer: Self-pay

## 2019-06-06 ENCOUNTER — Other Ambulatory Visit (INDEPENDENT_AMBULATORY_CARE_PROVIDER_SITE_OTHER): Payer: BC Managed Care – PPO

## 2019-06-06 DIAGNOSIS — E119 Type 2 diabetes mellitus without complications: Secondary | ICD-10-CM | POA: Diagnosis not present

## 2019-06-06 DIAGNOSIS — Z794 Long term (current) use of insulin: Secondary | ICD-10-CM | POA: Diagnosis not present

## 2019-06-06 DIAGNOSIS — E039 Hypothyroidism, unspecified: Secondary | ICD-10-CM

## 2019-06-06 DIAGNOSIS — E785 Hyperlipidemia, unspecified: Secondary | ICD-10-CM | POA: Diagnosis not present

## 2019-06-06 LAB — LIPID PANEL
Cholesterol: 174 mg/dL (ref 0–200)
HDL: 74.5 mg/dL (ref >39.00)
LDL Cholesterol: 83 mg/dL (ref 0–99)
NonHDL: 99.95
Total CHOL/HDL Ratio: 2
Triglycerides: 84 mg/dL (ref 0.0–149.0)
VLDL: 16.8 mg/dL (ref 0.0–40.0)

## 2019-06-06 LAB — TSH: TSH: 1.88 u[IU]/mL (ref 0.35–4.50)

## 2019-06-06 LAB — HEMOGLOBIN A1C: Hgb A1c MFr Bld: 9.4 % — ABNORMAL HIGH (ref 4.6–6.5)

## 2019-06-06 NOTE — Addendum Note (Signed)
Addended by: Ellamae Sia on: 06/06/2019 11:15 AM   Modules accepted: Orders

## 2019-06-06 NOTE — Telephone Encounter (Signed)
KC-Added on TSH/Plz advise on refill when lab result returns/thx dmf

## 2019-06-06 NOTE — Telephone Encounter (Signed)
Recent TSH at goal, refills sent to pharmacy.

## 2019-06-07 DIAGNOSIS — M545 Low back pain: Secondary | ICD-10-CM | POA: Diagnosis not present

## 2019-06-07 DIAGNOSIS — M5412 Radiculopathy, cervical region: Secondary | ICD-10-CM | POA: Diagnosis not present

## 2019-06-07 DIAGNOSIS — M5416 Radiculopathy, lumbar region: Secondary | ICD-10-CM | POA: Diagnosis not present

## 2019-06-07 DIAGNOSIS — M542 Cervicalgia: Secondary | ICD-10-CM | POA: Diagnosis not present

## 2019-06-09 DIAGNOSIS — M5412 Radiculopathy, cervical region: Secondary | ICD-10-CM | POA: Diagnosis not present

## 2019-06-09 DIAGNOSIS — M545 Low back pain: Secondary | ICD-10-CM | POA: Diagnosis not present

## 2019-06-09 DIAGNOSIS — M5416 Radiculopathy, lumbar region: Secondary | ICD-10-CM | POA: Diagnosis not present

## 2019-06-09 DIAGNOSIS — M542 Cervicalgia: Secondary | ICD-10-CM | POA: Diagnosis not present

## 2019-06-17 ENCOUNTER — Encounter: Payer: Self-pay | Admitting: Internal Medicine

## 2019-06-18 ENCOUNTER — Telehealth: Payer: Self-pay | Admitting: Family Medicine

## 2019-06-18 DIAGNOSIS — E1165 Type 2 diabetes mellitus with hyperglycemia: Secondary | ICD-10-CM

## 2019-06-18 DIAGNOSIS — Z794 Long term (current) use of insulin: Secondary | ICD-10-CM

## 2019-06-18 DIAGNOSIS — E119 Type 2 diabetes mellitus without complications: Secondary | ICD-10-CM

## 2019-06-18 MED ORDER — NOVOLOG FLEXPEN 100 UNIT/ML ~~LOC~~ SOPN: PEN_INJECTOR | SUBCUTANEOUS | 0 refills | Status: DC

## 2019-06-18 MED ORDER — BD PEN NEEDLE MINI U/F 31G X 5 MM MISC: 0 refills | Status: DC

## 2019-06-18 NOTE — Telephone Encounter (Signed)
Received call from call service. Noted that patient is out of her novolog this weekend and will not be able to get it from her mail order. She is requesting to have this sent to her local pharmacy. Phone number confirmed by RN. Novolog and pen needles sent to pharmacy for the patient. RN will inform her of this. Forwarding to PCP and patients endocrinologist to follow-up with patient to determine need for further refills.

## 2019-06-20 ENCOUNTER — Encounter: Payer: Self-pay | Admitting: Internal Medicine

## 2019-06-20 ENCOUNTER — Other Ambulatory Visit: Payer: Self-pay | Admitting: Internal Medicine

## 2019-06-20 DIAGNOSIS — E1165 Type 2 diabetes mellitus with hyperglycemia: Secondary | ICD-10-CM

## 2019-06-20 MED ORDER — NOVOLOG FLEXPEN 100 UNIT/ML ~~LOC~~ SOPN: PEN_INJECTOR | SUBCUTANEOUS | 3 refills | Status: DC

## 2019-06-20 NOTE — Telephone Encounter (Signed)
Gentry Fitz NP out of office until 06/21/19, sending note to Gentry Fitz NP,Dr [facility] CMA and Dr Danise Mina who is in office as FYI.

## 2019-06-20 NOTE — Telephone Encounter (Signed)
[facility] Night - Client TELEPHONE ADVICE RECORD [facility] Patient Name: Molly Wall Gender: Female DOB: 1946/03/16 Age: 74 Y 60 M 25 D Return Phone Number: Address: City/State/Zip: [name] [address] Client [employer] Night - Client Client Site [place] Physician Alma Friendly - NP Contact Type Call Who Is Calling Pharmacy Call Type Pharmacy Send to RN Chief Complaint Paging or Request for Consult Reason for Call Request to speak to Physician Initial Comment Caller states she is Molly Wall from [place]. A patient requested a refill of insulin because she is out. She usually gets her insulin from a mail order pharmacy. Additional Comment [facility] Name [place] Number [phone] Translation No Nurse Assessment Nurse: Marcello Moores, RN, Cheri Date/Time Eilene Ghazi Time): 06/18/2019 3:46:20 PM Confirm and document reason for call. If symptomatic, describe symptoms. ---Caller is a pharmacist that states patient is traveling out of State and is out of her Novalog Flex Pen. Ordinarily gets it from mail order. No new or worsening symptoms. Has the patient had close contact with a person known or suspected to have the novel coronavirus illness OR traveled / lives in area with major community spread (including international travel) in the last 14 days from the onset of symptoms? * If Asymptomatic, screen for exposure and travel within the last 14 days. ---No Does the patient have any new or worsening symptoms? ---No Nurse: Marcello Moores, RN, Cheri Date/Time (Eastern Time): 06/18/2019 3:52:39 PM Please select the assessment type ---Refill Additional Documentation ---Novolog 100 U/ml flexpen and pen needles Does the patient have enough medication to last until the office opens? ---No Additional Documentation ---Patient obtains insulin from a mail order pharmacy and is unable to obtain a  loaner dose. Guidelines Guideline Title Affirmed Question Affirmed Notes Nurse Date/Time (Eastern Time) PLEASE NOTE: All timestamps contained within this report are represented as [place] Standard Time. CONFIDENTIALTY NOTICE: This fax transmission is intended only for the addressee. It contains information that is legally privileged, confidential or otherwise protected from use or disclosure. If you are not the intended recipient, you are strictly prohibited from reviewing, disclosing, copying using or disseminating any of this information or taking any action in reliance on or regarding this information. If you have received this fax in error, please notify us immediately by telephone so that we can arrange for its return to [place]. Phone: [phone], Toll-Free: [phone], Fax: [phone] Page: 2 of 2 Call Id: [id number] [place]. Time Eilene Ghazi Time) Disposition Final User 06/18/2019 3:58:46 PM Called On-Call Provider Marcello Moores, RN, [place] 06/18/2019 3:59:32 PM Clinical Call Yes Marcello Moores, RN, [facility] Phone DateTime Result/Outcome Message Type Notes Tommi Rumps - MD [id number] 06/18/2019 3:58:46 PM Called On Call Provider - Reached Doctor Paged Tommi Rumps - MD 06/18/2019 3:59:19 PM Spoke with On Call - General Message Result On call provider will contact the pharmacist concerning insulin prescription

## 2019-06-20 NOTE — Telephone Encounter (Signed)
Looks like Dr Cruzita Lederer refilled insulin for patient.  Thank you.

## 2019-06-21 ENCOUNTER — Other Ambulatory Visit: Payer: Self-pay

## 2019-06-21 ENCOUNTER — Ambulatory Visit: Payer: BC Managed Care – PPO | Admitting: Podiatry

## 2019-06-21 DIAGNOSIS — M722 Plantar fascial fibromatosis: Secondary | ICD-10-CM

## 2019-06-24 ENCOUNTER — Other Ambulatory Visit: Payer: Self-pay | Admitting: Primary Care

## 2019-06-24 DIAGNOSIS — J45909 Unspecified asthma, uncomplicated: Secondary | ICD-10-CM

## 2019-06-24 NOTE — Progress Notes (Signed)
   Subjective: 74 y.o. female presenting today for follow up evaluation of plantar fasciitis of the right foot. She reports continued pain that is relatively unchanged since her previous visit. She states the injection and Meloxicam have not provided any relief. She has been using the plantar fascial brace and night splint which seem to help ease the symptoms. She started doing the exercises as instructed two days ago which help significantly. There are no worsening factors noted. Patient is here for further evaluation and treatment.   Past Medical History:  Diagnosis Date  . Allergic rhinitis   . Allergy   . Asthma   . Atypical ductal hyperplasia of right breast   . 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 Breast cancer ([place])   . Diabetes mellitus without complication ([place])   . Family history of breast cancer   . Family history of colon cancer   . Hypercholesteremia   . Hypothyroidism   . Lumbar herniated disc   . OSA (obstructive sleep apnea)    used to use CPAP, not any more over 1 1/2 yrs  . Salivary stone    left side  . Sleep apnea    no cpap in 2 yrs   . Vitamin D deficiency      Objective: Physical Exam General: The patient is alert and oriented x3 in no acute distress.  Dermatology: Skin is warm, dry and supple bilateral lower extremities. Negative for open lesions or macerations bilateral.   Vascular: Dorsalis Pedis and Posterior Tibial pulses palpable bilateral.  Capillary fill time is immediate to all digits.  Neurological: Epicritic and protective threshold intact bilateral.   Musculoskeletal: Tenderness to palpation to the plantar aspect of the right heel along the plantar fascia. All other joints range of motion within normal limits bilateral. Strength 5/5 in all groups bilateral.   Assessment: 1. Plantar fasciitis right  Plan of Care:  1. Patient evaluated.  2. Patient states injections did not help.  3. Continue daily stretching  exercises.  4. Custom orthotics are currently processing.  5. Continue using plantar fascial braces and night splint.  6. Continue taking OTC Aleve. Discontinue taking Meloxicam.  7. Return to clinic as needed.     Edrick Kins, DPM [facility]  Dr. Edrick Kins, DPM    [address], [place] [address]                Office [phone]  Fax [phone]

## 2019-06-28 ENCOUNTER — Ambulatory Visit
Admission: RE | Admit: 2019-06-28 | Discharge: 2019-06-28 | Disposition: A | Discharge status: Home / Self Care | Payer: BC Managed Care – PPO | Source: Ambulatory Visit | Attending: Primary Care | Admitting: Primary Care

## 2019-06-28 ENCOUNTER — Other Ambulatory Visit: Payer: Self-pay

## 2019-06-28 DIAGNOSIS — Z1231 Encounter for screening mammogram for malignant neoplasm of breast: Secondary | ICD-10-CM

## 2019-06-29 DIAGNOSIS — H2513 Age-related nuclear cataract, bilateral: Secondary | ICD-10-CM | POA: Diagnosis not present

## 2019-06-29 DIAGNOSIS — E119 Type 2 diabetes mellitus without complications: Secondary | ICD-10-CM | POA: Diagnosis not present

## 2019-06-29 LAB — HM DIABETES EYE EXAM

## 2019-06-30 ENCOUNTER — Encounter: Payer: Self-pay | Admitting: Internal Medicine

## 2019-07-02 ENCOUNTER — Other Ambulatory Visit: Payer: Self-pay | Admitting: Podiatry

## 2019-07-04 NOTE — Telephone Encounter (Signed)
I think patient said she preferred to take OTC Aleve. She cannot take both. Continue OTC Aleve PRN. - Thanks, Dr. Amalia Hailey

## 2019-07-05 NOTE — Telephone Encounter (Signed)
Patient is to continue OTC Aleve PRN per Dr Amalia Hailey

## 2019-07-07 ENCOUNTER — Other Ambulatory Visit: Payer: Self-pay | Admitting: Primary Care

## 2019-07-07 ENCOUNTER — Telehealth: Payer: Self-pay | Admitting: Primary Care

## 2019-07-07 DIAGNOSIS — E119 Type 2 diabetes mellitus without complications: Secondary | ICD-10-CM

## 2019-07-07 DIAGNOSIS — Z794 Long term (current) use of insulin: Secondary | ICD-10-CM

## 2019-07-07 NOTE — Telephone Encounter (Signed)
Message left for patient to return my call. It shows that [facility].

## 2019-07-07 NOTE — Telephone Encounter (Signed)
Pt is in need of pin needles. She said pharmacy is faxing over request also. She leaves in the morning going out of town and she need to make sure she has enough. I did notify her prescription requests takes 24-48 business hours to be filled. She understands but hope this can be filled today.

## 2019-07-07 NOTE — Telephone Encounter (Signed)
Patient called back Advised of message below.  I also spoke with pharmacy and these were ready for the patient.  Advised and patient stated she understood

## 2019-07-20 ENCOUNTER — Other Ambulatory Visit: Payer: Self-pay

## 2019-07-20 ENCOUNTER — Ambulatory Visit (INDEPENDENT_AMBULATORY_CARE_PROVIDER_SITE_OTHER): Payer: BC Managed Care – PPO | Admitting: Orthotics

## 2019-07-20 DIAGNOSIS — M722 Plantar fascial fibromatosis: Secondary | ICD-10-CM | POA: Diagnosis not present

## 2019-07-20 NOTE — Progress Notes (Signed)

## 2019-07-28 DIAGNOSIS — M545 Low back pain: Secondary | ICD-10-CM | POA: Diagnosis not present

## 2019-07-28 DIAGNOSIS — M5412 Radiculopathy, cervical region: Secondary | ICD-10-CM | POA: Diagnosis not present

## 2019-07-28 DIAGNOSIS — M5416 Radiculopathy, lumbar region: Secondary | ICD-10-CM | POA: Diagnosis not present

## 2019-07-28 DIAGNOSIS — M542 Cervicalgia: Secondary | ICD-10-CM | POA: Diagnosis not present

## 2019-07-29 ENCOUNTER — Telehealth: Payer: Self-pay

## 2019-07-29 ENCOUNTER — Other Ambulatory Visit: Payer: Self-pay

## 2019-07-29 DIAGNOSIS — G8929 Other chronic pain: Secondary | ICD-10-CM

## 2019-07-29 DIAGNOSIS — M545 Low back pain: Secondary | ICD-10-CM | POA: Diagnosis not present

## 2019-07-29 DIAGNOSIS — M5441 Lumbago with sciatica, right side: Secondary | ICD-10-CM

## 2019-07-29 DIAGNOSIS — M5412 Radiculopathy, cervical region: Secondary | ICD-10-CM | POA: Diagnosis not present

## 2019-07-29 DIAGNOSIS — M542 Cervicalgia: Secondary | ICD-10-CM | POA: Diagnosis not present

## 2019-07-29 DIAGNOSIS — M5416 Radiculopathy, lumbar region: Secondary | ICD-10-CM | POA: Diagnosis not present

## 2019-07-29 MED ORDER — CYCLOBENZAPRINE HCL 5 MG PO TABS: 5.0000 mg | ORAL_TABLET | Freq: Three times a day (TID) | ORAL | 0 refills | Status: DC | PRN

## 2019-07-29 NOTE — Telephone Encounter (Signed)
Spoken and notified patient of Kate Clark's comments. Patient verbalized understanding.  

## 2019-07-29 NOTE — Telephone Encounter (Signed)
Patient called back. Patient said she's waiting find out if a muscle relaxant can be called in [facility].

## 2019-07-29 NOTE — Telephone Encounter (Signed)
Please notify patient that prescription for Flexeril has been sent to her pharmacy.  This may cause drowsiness, so use caution.

## 2019-07-29 NOTE — Telephone Encounter (Signed)
Patient contacted the office stating that she threw her back out yesterday and she has been experiencing severe muscle spasms. Patient states she has taken Flexeril years ago for a back injury, and she would like an rx for this. Patient states she does not want to come in, nor do a virtual visit today. She states she just wants something to get her through the weekend until she can come in next week. I advised I would send a message to Anda Kraft and see what we could do.

## 2019-08-01 DIAGNOSIS — M545 Low back pain: Secondary | ICD-10-CM | POA: Diagnosis not present

## 2019-08-01 DIAGNOSIS — M5416 Radiculopathy, lumbar region: Secondary | ICD-10-CM | POA: Diagnosis not present

## 2019-08-01 DIAGNOSIS — M542 Cervicalgia: Secondary | ICD-10-CM | POA: Diagnosis not present

## 2019-08-01 DIAGNOSIS — M5412 Radiculopathy, cervical region: Secondary | ICD-10-CM | POA: Diagnosis not present

## 2019-08-02 ENCOUNTER — Other Ambulatory Visit: Payer: Self-pay

## 2019-08-02 ENCOUNTER — Encounter: Payer: Self-pay | Admitting: Internal Medicine

## 2019-08-02 ENCOUNTER — Ambulatory Visit (INDEPENDENT_AMBULATORY_CARE_PROVIDER_SITE_OTHER): Payer: BC Managed Care – PPO | Admitting: Internal Medicine

## 2019-08-02 VITALS — BP 130/78 | HR 74 | Ht 65.0 in | Wt 213.0 lb

## 2019-08-02 DIAGNOSIS — Z794 Long term (current) use of insulin: Secondary | ICD-10-CM | POA: Diagnosis not present

## 2019-08-02 DIAGNOSIS — E785 Hyperlipidemia, unspecified: Secondary | ICD-10-CM

## 2019-08-02 DIAGNOSIS — E1165 Type 2 diabetes mellitus with hyperglycemia: Secondary | ICD-10-CM

## 2019-08-02 DIAGNOSIS — E669 Obesity, unspecified: Secondary | ICD-10-CM | POA: Diagnosis not present

## 2019-08-02 MED ORDER — LANTUS SOLOSTAR 100 UNIT/ML ~~LOC~~ SOPN: 35.0000 [IU] | PEN_INJECTOR | Freq: Every day | SUBCUTANEOUS | 3 refills | Status: DC

## 2019-08-02 NOTE — Progress Notes (Signed)
Patient ID: Molly Wall, female   DOB: 1946-01-20, 74 y.o.   MRN: [id number]   This visit occurred during the SARS-CoV-2 public health emergency.  Safety protocols were in place, including screening questions prior to the visit, additional usage of staff PPE, and extensive cleaning of exam room while observing appropriate contact time as indicated for disinfecting solutions.   HPI: Molly Wall is a 74 y.o.-year-old female, initially referred by her PCP, Molly Friendly, MD, returning for follow-up for DM2, dx at 15 (2017), h/o reactive hypoglycemia, insulin-dependent, uncontrolled, without long-term complications.  Last visit 3 months ago.  Reviewed HbA1c levels: Lab Results  Component Value Date   HGBA1C 9.4 (H) 06/06/2019   HGBA1C 9.1 (A) 02/08/2019   HGBA1C 9.6 (A) 10/27/2018   HGBA1C 8.2 (A) 07/30/2018   HGBA1C 11.1 (A) 04/27/2018   HGBA1C 11.3 (H) 12/09/2017   HGBA1C 6.8 (H) 04/10/2016   HGBA1C 6.6 (H) 01/09/2016   HGBA1C 6.4 07/16/2015   She is on: - Metformin ER 1000 mg with breakfast - Ozempic 0.5 >> 1 mg weekly - Lantus 35 >> 25 units in am - Novolog 7-9 units before 15 min before meals  If sugars before meals <60, do not take Novolog before that meal. If sugars before meals <90, take 50% of the Novolog before that meal At bedtime, only correct sugars >250, and only with up to 5 units of Novolog.  Pt checks her sugars 4 times a day: - am: 82-374 >> 141-239, 333 >> 159-338 - 2h after b'fast: 240 >> n/c >> 197 - before lunch: 70-240, 376 >> 84-269 >> 68-325, 506 - 2h after lunch: 100-126 >> n/c - before dinner: 87-448, 578 >> 100-306, 419 >> 95-356 - 2h after dinner: 267, 289 >> n/c - bedtime:55, 166,  286 >> 191-436 (ave 200s) >> 227-431 - nighttime: n/c Lowest sugar was 55 >> 84 >> 47; she has hypoglycemia awareness in the 80s. Highest sugar was 578  >>  436 >> 591.  Glucometer: One Touch Verio  Pt's meals are: - Breakfast: Apple + peanut butter; English  muffin/cream cheese - Lunch: Soup (homemade) or 1 meat + 2 veggies - Dinner: 1 meat + 2 veggies or stew + bread - Snacks: 1 snack at bedtime-nuts  -No CKD, last BUN/creatinine:  Lab Results  Component Value Date   BUN 21 01/06/2019   BUN 17 12/30/2018   CREATININE 0.82 01/06/2019   CREATININE 0.71 12/30/2018  She is not on ACE inhibitor or ARB.  -No HL; last set of lipids: Lab Results  Component Value Date   CHOL 174 06/06/2019   HDL 74.50 06/06/2019   LDLCALC 83 06/06/2019   TRIG 84.0 06/06/2019   CHOLHDL 2 06/06/2019  On Lipitor 20.  - last eye exam was in 06/2019: No DR  -She denies numbness and tingling in her feet.  Pt has FH of DM in [place].  She has OSA and started CPAP just before last visit >> she feels much better after starting this.  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  She also has a h/o thyrotoxicosis many years ago.  She had RAI treatment>> now hypothyroidism.  On levothyroxine 88 mcg 6 out of 7 days and 1.5 tablets 1 out of 7 days.    Latest TSH level was normal: Lab Results  Component Value Date   TSH 1.88 06/06/2019   She lives alone.  ROS:  Constitutional: no weight gain/no weight loss, no fatigue, no subjective hyperthermia, no subjective hypothermia Eyes: no blurry vision, no xerophthalmia ENT: no sore throat, no nodules palpated in neck, no dysphagia, no odynophagia, no hoarseness Cardiovascular: no CP/no SOB/no palpitations/no leg swelling Respiratory: no cough/no SOB/no wheezing Gastrointestinal: no N/no V/no D/no C/no acid reflux Musculoskeletal: no muscle aches/no joint aches Skin: no rashes, no hair loss Neurological: no tremors/no numbness/no tingling/no dizziness  I reviewed pt's medications, allergies, PMH, social hx, family hx, and changes were documented in the history of present illness. Otherwise, unchanged from my initial visit note.  Past Medical History:  Diagnosis  Date  . Allergic rhinitis   . Allergy   . Asthma   . Atypical ductal hyperplasia of right breast   . 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 Breast cancer ([place])   . Diabetes mellitus without complication ([place])   . Family history of breast cancer   . Family history of colon cancer   . Hypercholesteremia   . Hypothyroidism   . Lumbar herniated disc   . OSA (obstructive sleep apnea)    used to use CPAP, not any more over 1 1/2 yrs  . Salivary stone    left side  . Sleep apnea    no cpap in 2 yrs   . Vitamin D deficiency    Past Surgical History:  Procedure Laterality Date  . APPENDECTOMY    . BREAST LUMPECTOMY WITH RADIOACTIVE SEED LOCALIZATION Right 08/12/2017   Procedure: RIGHT BREAST LUMPECTOMY X'S 2 WITH RADIOACTIVE SEED LOCALIZATION X'S 2;  Surgeon: Fanny Skates, MD;  Location: [facility];  Service: General;  Laterality: Right;  . COLONOSCOPY     11 yrs ago in [place] but given a 10 yr recall per pt   . GANGLION CYST EXCISION     74 years old  . INCISION AND DRAINAGE ABSCESS ANAL    . KNEE SURGERY  2014  . SHOULDER SURGERY  1976   Social History   Socioeconomic History  . Marital status: Divorced    Spouse name: Not on file  . Number of children: 2  . Years of education: Not on file  . Highest education level: Not on file  Occupational History  . Not on file  Tobacco Use  . Smoking status: Former Smoker    Packs/day: 1.00    Years: 2.00    Pack years: 2.00    Types: Cigarettes  . Smokeless tobacco: Never Used  . Tobacco comment: quite smoker at age 5  Substance and Sexual Activity  . Alcohol use: Yes    Alcohol/week: 0.0 - 1.0 standard drinks    Comment: 0-1 per month  . Drug use: No  . Sexual activity: Not Currently  Other Topics Concern  . Not on file  Social History Narrative   Single.   Moved from [place] to [place].   Family lives in [place].   Professor of sociology and psychology.   Enjoys spending time  on the computer and teaching online, spending time with her family.   Social Determinants of Health   Financial Resource Strain:   . Difficulty of Paying Living Expenses: Not on file  Food Insecurity:   . Worried About Charity fundraiser in the Last Year: Not on file  . Ran Out of Food in the Last Year: Not on file  Transportation Needs:   . Lack of Transportation (Medical): Not on file  . Lack of  Transportation (Non-Medical): Not on file  Physical Activity:   . Days of Exercise per Week: Not on file  . Minutes of Exercise per Session: Not on file  Stress:   . Feeling of Stress : Not on file  Social Connections:   . Frequency of Communication with Friends and Family: Not on file  . Frequency of Social Gatherings with Friends and Family: Not on file  . Attends Religious Services: Not on file  . Active Member of Clubs or Organizations: Not on file  . Attends Archivist Meetings: Not on file  . Marital Status: Not on file  Intimate Partner Violence:   . Fear of Current or Ex-Partner: Not on file  . Emotionally Abused: Not on file  . Physically Abused: Not on file  . Sexually Abused: Not on file   Current Outpatient Medications on File Prior to Visit  Medication Sig Dispense Refill  . albuterol (PROAIR HFA) 108 (90 Base) MCG/ACT inhaler Inhale 2 puffs into the lungs every 6 (six) hours as needed for wheezing or shortness of breath. 18 g 0  . anastrozole (ARIMIDEX) 1 MG tablet TAKE 1 TABLET BY MOUTH EVERY DAY 30 tablet 14  . atorvastatin (LIPITOR) 20 MG tablet TAKE 1 TABLET BY MOUTH EVERY DAY FOR CHOLESTEROL 90 tablet 3  . B-D UF III MINI PEN NEEDLES 31G X 5 MM MISC USE AS DIRECTED USE DAILY TO ADMINISTER VICTOZA 200 each 2  . Continuous Blood Gluc Transmit (DEXCOM G6 TRANSMITTER) MISC 1 Device by Does not apply route every 3 (three) months. (Patient not taking: Reported on 05/05/2019) 1 each 3  . cyclobenzaprine (FLEXERIL) 5 MG tablet Take 1 tablet (5 mg total) by mouth 3  (three) times daily as needed for muscle spasms. 15 tablet 0  . fluticasone (FLONASE) 50 MCG/ACT nasal spray USE 1 SPRAY IN BOTH  NOSTRILS TWICE DAILY AS  NEEDED FOR ALLERGIES OR  RHINITIS 48 g 2  . Fluticasone-Salmeterol (ADVAIR DISKUS) 250-50 MCG/DOSE AEPB Inhale 1 puff into the lungs 2 (two) times daily. 60 each 5  . insulin aspart (NOVOLOG FLEXPEN) 100 UNIT/ML FlexPen Inject 9-12 units before meals 30 mL 3  . Insulin Glargine (LANTUS SOLOSTAR) 100 UNIT/ML Solostar Pen Inject 35 Units into the skin daily. 45 mL 2  . Lancets (ONETOUCH DELICA PLUS 123XX123) MISC Inject 1 Device into the skin 3 (three) times daily. To check blood sugar 300 each 2  . levothyroxine (SYNTHROID) 88 MCG tablet TAKE 1 TABLET BY MOUTH DAILY MONDAY THROUGH SATURDAY AND 1 AND 1/2 TABLETS EVERY SUNDAY. TAKE ON AN EMPTY STOMACH WITH WATER ONLY. NO FOOD OR OTHER MEDICATIONS FOR 30 MINUTES 98 tablet 3  . meloxicam (MOBIC) 15 MG tablet Take 1 tablet (15 mg total) by mouth daily. 30 tablet 1  . metFORMIN (GLUCOPHAGE-XR) 500 MG 24 hr tablet TAKE 2 TABLETS BY MOUTH EVERY MORNING 180 tablet 3  . montelukast (SINGULAIR) 10 MG tablet Take 1 tablet (10 mg total) by mouth at bedtime. For allergies and asthma. 90 tablet 1  . ONETOUCH VERIO test strip USE AS DIRECTED TO CHECK FASTING BLOOD SUGARS THREE TIMES DAILY. Insulin Dependent Dx Code E11.9 100 strip 10  . Semaglutide, 1 MG/DOSE, (OZEMPIC, 1 MG/DOSE,) 2 MG/1.5ML SOPN Inject 1 mg into the skin once a week. 6 pen 3  . WIXELA INHUB 500-50 MCG/DOSE AEPB TAKE 1 PUFF BY MOUTH TWICE A DAY 60 each 0   No current facility-administered medications on file prior to visit.  Allergies  Allergen Reactions  . 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 She described it was a small amount in a milk carton in [place]. She states they stopped using it shortly after because the trial wasn't successful. SPM   Family History   Problem Relation Age of Onset  . Alzheimer's disease Mother   . Asthma Mother   . Dementia Mother        cause of death  . Arthritis Father   . Heart attack Father 64       cause of death  . Breast cancer Sister 67  . Colon cancer Paternal Grandmother   . Liver cancer Maternal Uncle   . Colon polyps Neg Hx   . Rectal cancer Neg Hx   . Stomach cancer Neg Hx    PE: BP 130/78   Pulse 74   Ht 5\' 5"  (1.651 m)   Wt 213 lb (96.6 kg)   SpO2 99%   BMI 35.45 kg/m  Wt Readings from Last 3 Encounters:  08/02/19 213 lb (96.6 kg)  03/01/19 210 lb (95.3 kg)  02/08/19 208 lb 12 oz (94.7 kg)   Constitutional: overweight, in NAD Eyes: PERRLA, EOMI, no exophthalmos ENT: moist mucous membranes, no thyromegaly, no cervical lymphadenopathy Cardiovascular: RRR, No MRG Respiratory: CTA B Gastrointestinal: abdomen soft, NT, ND, BS+ Musculoskeletal: no deformities, strength intact in all 4 Skin: moist, warm, no rashes Neurological: no tremor with outstretched hands, DTR normal in all 4  ASSESSMENT: 1. DM2, insulin-dependent, uncontrolled, without long-term complications  2. HL  3. Obesity class 1  PLAN:  1. Patient with longstanding, uncontrolled, type 2 diabetes, on basal-bolus insulin regimen, and also Metformin ER and weekly GLP-1 receptor agonist, increased at last visit.  In the past, we tried to start her on a CGM.  She is not on this now. -At last visit, she has not been taking the recommended NovoLog doses, but the lower amount.  Sugars are high although they were improving later in the day.  We increase the NovoLog doses and we also increased her Ozempic dose, especially since she complained about weight gain. -She had another HbA1c level since last visit and this was slightly higher than the previous, at 9.4% -At this visit, sugars are worse than before, very fluctuating, between 40s and 500s.  She had sugars in the 500s when she was in vacation and went to [place]. -Her sugars are highest in the morning so we will add a lower dose of Lantus at night.  We have to be very careful since she can also drop her sugar significantly with higher doses of insulin.  We will continue the same doses of NovoLog especially as she is preparing to go on a plant-based diet.  She is reading Dr. Emilio Math book program for reversing diabetes and planning to start this diet as soon as possible. -We discussed that she is most likely insulin deficient and will most likely need to continue insulin, but I am hoping for less fluctuation after she starts the diet - I suggested to:  Patient Instructions  Please continue: - Metformin ER 1000 mg with breakfast - Ozempic 1 mg weekly - Novolog 7-9 units before 15 min before meals  If sugars before meals <60, do not take Novolog before that meal. If sugars before meals <90, take 50% of the Novolog before that meal At bedtime, only correct sugars >250, and  only with up to 5 units of Novolog.  Please increase: - Lantus 25 units in am and 10 units at bedtime  Please return in 3 months with your sugar log.   - advised to check sugars at different times of the day - 3-4x a day, rotating check times - advised for yearly eye exams >> she is not UTD - return to clinic in 3 months  2. HL -Reviewed latest lipid panel from 05/2019: LDL at goal as are the rest of her lipid fractions: Lab Results  Component Value Date   CHOL 174 06/06/2019   HDL 74.50 06/06/2019   LDLCALC 83 06/06/2019   TRIG 84.0 06/06/2019   CHOLHDL 2 06/06/2019  -Continues Lipitor without side effects  3.  Obesity class I -Continue Ozempic which should also help with weight loss -Before last visit, she gained 10 pounds, now gained 3 lbs since last OV -Starting a plant-based diet will also help   Philemon Kingdom, MD PhD [facility] Endocrinology

## 2019-08-02 NOTE — Patient Instructions (Addendum)
Please continue: - Metformin ER 1000 mg with breakfast - Ozempic 1 mg weekly - Novolog 7-9 units before 15 min before meals  If sugars before meals <60, do not take Novolog before that meal. If sugars before meals <90, take 50% of the Novolog before that meal At bedtime, only correct sugars >250, and only with up to 5 units of Novolog.  Please increase: - Lantus 25 units in am and 10 units at bedtime  Please return in 3 months with your sugar log.

## 2019-08-03 DIAGNOSIS — M542 Cervicalgia: Secondary | ICD-10-CM | POA: Diagnosis not present

## 2019-08-03 DIAGNOSIS — M5412 Radiculopathy, cervical region: Secondary | ICD-10-CM | POA: Diagnosis not present

## 2019-08-03 DIAGNOSIS — M5416 Radiculopathy, lumbar region: Secondary | ICD-10-CM | POA: Diagnosis not present

## 2019-08-03 DIAGNOSIS — M545 Low back pain: Secondary | ICD-10-CM | POA: Diagnosis not present

## 2019-08-08 ENCOUNTER — Other Ambulatory Visit: Payer: Self-pay | Admitting: Primary Care

## 2019-08-08 DIAGNOSIS — M545 Low back pain: Secondary | ICD-10-CM | POA: Diagnosis not present

## 2019-08-08 DIAGNOSIS — M5416 Radiculopathy, lumbar region: Secondary | ICD-10-CM | POA: Diagnosis not present

## 2019-08-08 DIAGNOSIS — E039 Hypothyroidism, unspecified: Secondary | ICD-10-CM

## 2019-08-08 DIAGNOSIS — M542 Cervicalgia: Secondary | ICD-10-CM | POA: Diagnosis not present

## 2019-08-08 DIAGNOSIS — M5412 Radiculopathy, cervical region: Secondary | ICD-10-CM | POA: Diagnosis not present

## 2019-08-11 ENCOUNTER — Inpatient Hospital Stay: Payer: BC Managed Care – PPO | Admitting: Oncology

## 2019-08-11 DIAGNOSIS — M5412 Radiculopathy, cervical region: Secondary | ICD-10-CM | POA: Diagnosis not present

## 2019-08-11 DIAGNOSIS — M545 Low back pain: Secondary | ICD-10-CM | POA: Diagnosis not present

## 2019-08-11 DIAGNOSIS — M542 Cervicalgia: Secondary | ICD-10-CM | POA: Diagnosis not present

## 2019-08-11 DIAGNOSIS — M5416 Radiculopathy, lumbar region: Secondary | ICD-10-CM | POA: Diagnosis not present

## 2019-08-16 ENCOUNTER — Other Ambulatory Visit: Payer: Self-pay | Admitting: Primary Care

## 2019-08-16 DIAGNOSIS — E785 Hyperlipidemia, unspecified: Secondary | ICD-10-CM

## 2019-08-18 ENCOUNTER — Other Ambulatory Visit: Payer: Self-pay

## 2019-08-18 ENCOUNTER — Ambulatory Visit: Payer: BC Managed Care – PPO | Admitting: Orthotics

## 2019-08-18 DIAGNOSIS — M722 Plantar fascial fibromatosis: Secondary | ICD-10-CM

## 2019-08-18 NOTE — Progress Notes (Signed)
Patient came in today to pick up custom made foot orthotics.  The goals were accomplished and the patient reported no dissatisfaction with said orthotics.  Patient was advised of breakin period and how to report any issues. 

## 2019-08-24 DIAGNOSIS — E1165 Type 2 diabetes mellitus with hyperglycemia: Secondary | ICD-10-CM | POA: Diagnosis not present

## 2019-09-06 ENCOUNTER — Other Ambulatory Visit: Payer: Self-pay | Admitting: Primary Care

## 2019-09-07 ENCOUNTER — Other Ambulatory Visit (INDEPENDENT_AMBULATORY_CARE_PROVIDER_SITE_OTHER): Payer: BC Managed Care – PPO

## 2019-09-07 DIAGNOSIS — Z794 Long term (current) use of insulin: Secondary | ICD-10-CM

## 2019-09-07 DIAGNOSIS — E1165 Type 2 diabetes mellitus with hyperglycemia: Secondary | ICD-10-CM | POA: Diagnosis not present

## 2019-09-07 LAB — POCT GLYCOSYLATED HEMOGLOBIN (HGB A1C): Hemoglobin A1C: 8.2 % — AB (ref 4.0–5.6)

## 2019-09-13 DIAGNOSIS — G4733 Obstructive sleep apnea (adult) (pediatric): Secondary | ICD-10-CM | POA: Diagnosis not present

## 2019-09-29 DIAGNOSIS — M542 Cervicalgia: Secondary | ICD-10-CM | POA: Diagnosis not present

## 2019-09-29 DIAGNOSIS — M545 Low back pain: Secondary | ICD-10-CM | POA: Diagnosis not present

## 2019-09-29 DIAGNOSIS — M5412 Radiculopathy, cervical region: Secondary | ICD-10-CM | POA: Diagnosis not present

## 2019-09-29 DIAGNOSIS — M5416 Radiculopathy, lumbar region: Secondary | ICD-10-CM | POA: Diagnosis not present

## 2019-10-04 DIAGNOSIS — M545 Low back pain: Secondary | ICD-10-CM | POA: Diagnosis not present

## 2019-10-04 DIAGNOSIS — M542 Cervicalgia: Secondary | ICD-10-CM | POA: Diagnosis not present

## 2019-10-04 DIAGNOSIS — M5412 Radiculopathy, cervical region: Secondary | ICD-10-CM | POA: Diagnosis not present

## 2019-10-04 DIAGNOSIS — M5416 Radiculopathy, lumbar region: Secondary | ICD-10-CM | POA: Diagnosis not present

## 2019-10-10 DIAGNOSIS — E1165 Type 2 diabetes mellitus with hyperglycemia: Secondary | ICD-10-CM | POA: Diagnosis not present

## 2019-10-13 ENCOUNTER — Telehealth: Payer: Self-pay | Admitting: Dietician

## 2019-10-13 DIAGNOSIS — M5416 Radiculopathy, lumbar region: Secondary | ICD-10-CM | POA: Diagnosis not present

## 2019-10-13 DIAGNOSIS — M545 Low back pain: Secondary | ICD-10-CM | POA: Diagnosis not present

## 2019-10-13 DIAGNOSIS — M542 Cervicalgia: Secondary | ICD-10-CM | POA: Diagnosis not present

## 2019-10-13 DIAGNOSIS — M5412 Radiculopathy, cervical region: Secondary | ICD-10-CM | POA: Diagnosis not present

## 2019-10-13 NOTE — Telephone Encounter (Signed)
Patient returned call. She explained she has been following a vegan diet in recent weeks in effort to improve high BGs (200s-300s despite continuing to follow recommendations given in diabetes classes). She noticed marked improvement with BG control, but is now having issues with low BGs 1-2x daily, often quick drops in blood sugars. She reports eating beans, plant based meat alternatives, as well as low-carb vegetables; sometimes thick forms of pasta ie bowties in small portions. She is avoiding saturated fat and sugar. Encouraged inclusion of a protein source with each meal, small amounts of healthy fats, and small-moderate amounts of carbohydrate with meals. Mentioned Mediterranean eating pattern as a healthy pattern for diabetes as well as heart health. Patient will check in to [place] eating, and will review materials from the diabetes classes to help ensure adequate nutritional intake and reduce BG fluctuations.  Encouraged patient to call back with any further questions or concerns.

## 2019-10-13 NOTE — Telephone Encounter (Signed)
Returned message from patient requesting clarification on nutrition information she received during diabetes education program. Unable to leave a message due to full voicemail.

## 2019-10-14 ENCOUNTER — Telehealth: Payer: Self-pay | Admitting: Primary Care

## 2019-10-14 ENCOUNTER — Telehealth: Payer: Self-pay | Admitting: Internal Medicine

## 2019-10-14 NOTE — Telephone Encounter (Signed)
Are you able to contact this pt to help her with sharing her Dexcom info?

## 2019-10-14 NOTE — Telephone Encounter (Signed)
Thank you! Yes, please show me once you can see her results.

## 2019-10-14 NOTE — Telephone Encounter (Signed)
Patient requests to be called at ph# [phone] or send a MyChart message re: Patient needs the Clinic Generated Share Code for Dexcom so that Dr. Cruzita Lederer can be connected to patient's Dexcom account

## 2019-10-14 NOTE — Telephone Encounter (Signed)
Patient called in stating she has been placed on a dexcom monitor. Patient stated she spoke with the [facility] herself and was told to ask for a clinic generated share code. Please advise.

## 2019-10-14 NOTE — Telephone Encounter (Signed)
Sent an invite with share code.

## 2019-10-16 ENCOUNTER — Other Ambulatory Visit: Payer: Self-pay | Admitting: Primary Care

## 2019-10-16 DIAGNOSIS — Z794 Long term (current) use of insulin: Secondary | ICD-10-CM

## 2019-10-17 NOTE — Telephone Encounter (Signed)
Pt has still not signed up for dexcom download. Contacted pt and she reports she did not receive email. She requested it be sent to  [email or web address]  Resent to email above. Advised if any problems to contact office. Pt verbalized understanding.

## 2019-10-18 NOTE — Telephone Encounter (Signed)
Tried again to contact patient Molly Wall that I had emailed her the sharing code

## 2019-10-18 NOTE — Telephone Encounter (Signed)
Sharing code was emailed to her.  Her voice mail is full and will not accept messages

## 2019-10-19 NOTE — Telephone Encounter (Signed)
Tried again to lvm but voice mail is full.

## 2019-10-21 NOTE — Telephone Encounter (Signed)
Pt has signed up. Downloaded info and will bring to [place] when back in office.

## 2019-11-02 NOTE — Telephone Encounter (Signed)
Engineer, structural brought to [place].

## 2019-11-07 ENCOUNTER — Other Ambulatory Visit: Payer: Self-pay

## 2019-11-07 ENCOUNTER — Encounter: Payer: Self-pay | Admitting: Internal Medicine

## 2019-11-07 MED ORDER — DEXCOM G6 SENSOR MISC: 11 refills | Status: DC

## 2019-11-07 NOTE — Telephone Encounter (Signed)
Already address with patient.

## 2019-11-08 ENCOUNTER — Other Ambulatory Visit: Payer: Self-pay

## 2019-11-08 MED ORDER — DEXCOM G6 SENSOR MISC: 11 refills | Status: DC

## 2019-11-15 NOTE — Patient Instructions (Signed)
Please continue: - Metformin ER 1000 mg with breakfast - Ozempic 1 mg weekly - Lantus 20 units in am and 10 units at bedtime  Please change: - Novolog 15 min before the meals B'fast: 6-8 units  Lunch: 10-11 units Dinner: 6-7 units If sugars before meals <60, do not take Novolog before that meal. If sugars before meals <90, take 50% of the Novolog before that meal At bedtime, only correct sugars >250, and only with up to 5 units of Novolog.  Please return in 3 months with your sugar log.

## 2019-11-15 NOTE — Progress Notes (Signed)
Patient ID: Molly Wall, female   DOB: Apr 25, 1945, 74 y.o.   MRN: [id number]   This visit occurred during the SARS-CoV-2 public health emergency.  Safety protocols were in place, including screening questions prior to the visit, additional usage of staff PPE, and extensive cleaning of exam room while observing appropriate contact time as indicated for disinfecting solutions.   HPI: Molly Wall is a 74 y.o.-year-old female, initially referred by her PCP, Alma Friendly, MD, returning for follow-up for DM2, dx at 85 (2017), h/o reactive hypoglycemia, insulin-dependent, uncontrolled, without long-term complications.  Last visit 3 months ago.  Since last OV, she started on a low fat whole food plant based diet. She loves it.  Sugars improved, but they are still quite variable  Reviewed HbA1c levels: Lab Results  Component Value Date   HGBA1C 8.2 (A) 09/07/2019   HGBA1C 9.4 (H) 06/06/2019   HGBA1C 9.1 (A) 02/08/2019   HGBA1C 9.6 (A) 10/27/2018   HGBA1C 8.2 (A) 07/30/2018   HGBA1C 11.1 (A) 04/27/2018   HGBA1C 11.3 (H) 12/09/2017   HGBA1C 6.8 (H) 04/10/2016   HGBA1C 6.6 (H) 01/09/2016   HGBA1C 6.4 07/16/2015   She is on: - Metformin ER 1000 mg with breakfast - Ozempic 0.5 >> 1 mg weekly - Lantus 35 >> 25 units in am >> 25 units in a.m. and 10 units at bedtime >> 20 units in am and 10 units at night - Novolog 7-9 units before 15 min before meals  If sugars before meals <60, do not take Novolog before that meal. If sugars before meals <90, take 50% of the Novolog before that meal At bedtime, only correct sugars >250, and only with up to 5 units of Novolog.  CGM parameters: - Average from CGM: 197+/-69  Time in range:  - very low (<54): 0% - low (<70): 0.4% - normal range (70-180): 43.7% - high sugars (>180): 55.9% - very high sugars (>250): 23.4%     Prev.: - am: 82-374 >> 141-239, 333 >> 159-338 - 2h after b'fast: 240 >> n/c >> 197 - before lunch: 70-240, 376 >>  84-269 >> 68-325, 506 - 2h after lunch: 100-126 >> n/c - before dinner: 87-448, 578 >> 100-306, 419 >> 95-356 - 2h after dinner: 267, 289 >> n/c - bedtime:55, 166,  286 >> 191-436 (ave 200s) >> 227-431 - nighttime: n/c Lowest sugar was 55 >> 84 >> 47; she has hypoglycemia awareness in the 80s. Highest sugar was 578  >>  436 >> 591.  Glucometer: One Touch Verio  -No CKD, last BUN/creatinine:  Lab Results  Component Value Date   BUN 21 01/06/2019   BUN 17 12/30/2018   CREATININE 0.82 01/06/2019   CREATININE 0.71 12/30/2018  Not on ACE inhibitor/ARB.  -No HL; last set of lipids: Lab Results  Component Value Date   CHOL 174 06/06/2019   HDL 74.50 06/06/2019   LDLCALC 83 06/06/2019   TRIG 84.0 06/06/2019   CHOLHDL 2 06/06/2019  On Lipitor 20.  - last eye exam was in 06/2019: No DR  -No numbness and tingling in her feet.  Pt has FH of DM in [place].  She has OSA and started CPAP just before last visit >> she feels much better after starting this.  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  She has a history of thyrotoxicosis for many years.  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    She takes levothyroxine 88 mcg 1 tablet 6 out of 7 days and 1.5 tablets 1 out of 7 days.    Latest TSH was normal: Lab Results  Component Value Date   TSH 1.88 06/06/2019   She lives alone.  ROS: Constitutional: no weight gain/no weight loss, no fatigue, no subjective hyperthermia, no subjective hypothermia Eyes: no blurry vision, no xerophthalmia ENT: no sore throat, no nodules palpated in neck, no dysphagia, no odynophagia, no hoarseness Cardiovascular: no CP/no SOB/no palpitations/no leg swelling Respiratory: no cough/no SOB/no wheezing Gastrointestinal: no N/no V/no D/no C/no acid reflux Musculoskeletal: no muscle aches/no joint aches Skin: no rashes, no hair loss Neurological: no tremors/no numbness/no  tingling/no dizziness  I reviewed pt's medications, allergies, PMH, social hx, family hx, and changes were documented in the history of present illness. Otherwise, unchanged from my initial visit note.  Past Medical History:  Diagnosis Date  . Allergic rhinitis   . Allergy   . Asthma   . Atypical ductal hyperplasia of right breast   . 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 Breast cancer ([place])   . Diabetes mellitus without complication ([place])   . Family history of breast cancer   . Family history of colon cancer   . Hypercholesteremia   . Hypothyroidism   . Lumbar herniated disc   . OSA (obstructive sleep apnea)    used to use CPAP, not any more over 1 1/2 yrs  . Salivary stone    left side  . Sleep apnea    no cpap in 2 yrs   . Vitamin D deficiency    Past Surgical History:  Procedure Laterality Date  . APPENDECTOMY    . BREAST LUMPECTOMY WITH RADIOACTIVE SEED LOCALIZATION Right 08/12/2017   Procedure: RIGHT BREAST LUMPECTOMY X'S 2 WITH RADIOACTIVE SEED LOCALIZATION X'S 2;  Surgeon: Fanny Skates, MD;  Location: [facility];  Service: General;  Laterality: Right;  . COLONOSCOPY     11 yrs ago in [place] but given a 10 yr recall per pt   . GANGLION CYST EXCISION     74 years old  . INCISION AND DRAINAGE ABSCESS ANAL    . KNEE SURGERY  2014  . SHOULDER SURGERY  1976   Social History   Socioeconomic History  . Marital status: Divorced    Spouse name: Not on file  . Number of children: 2  . Years of education: Not on file  . Highest education level: Not on file  Occupational History  . Not on file  Tobacco Use  . Smoking status: Former Smoker    Packs/day: 1.00    Years: 2.00    Pack years: 2.00    Types: Cigarettes  . Smokeless tobacco: Never Used  . Tobacco comment: quite smoker at age 62  Substance and Sexual Activity  . Alcohol use: Yes    Alcohol/week: 0.0 - 1.0 standard drinks    Comment: 0-1 per month  . Drug  use: No  . Sexual activity: Not Currently  Other Topics Concern  . Not on file  Social History Narrative   Single.   Moved from [place] to [place].   Family lives in [place].   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   Social Determinants of Health   Financial Resource Strain:   . Difficulty of Paying Living Expenses: Not on file  Food Insecurity:   . Worried About Charity fundraiser in the Last Year: Not on file  . Ran Out of Food in the Last Year: Not on file  Transportation Needs:   . Lack of Transportation (Medical): Not on file  . Lack of Transportation (Non-Medical): Not on file  Physical Activity:   . Days of Exercise per Week: Not on file  . Minutes of Exercise per Session: Not on file  Stress:   . Feeling of Stress : Not on file  Social Connections:   . Frequency of Communication with Friends and Family: Not on file  . Frequency of Social Gatherings with Friends and Family: Not on file  . Attends Religious Services: Not on file  . Active Member of Clubs or Organizations: Not on file  . Attends Archivist Meetings: Not on file  . Marital Status: Not on file  Intimate Partner Violence:   . Fear of Current or Ex-Partner: Not on file  . Emotionally Abused: Not on file  . Physically Abused: Not on file  . Sexually Abused: Not on file   Current Outpatient Medications on File Prior to Visit  Medication Sig Dispense Refill  . albuterol (PROAIR HFA) 108 (90 Base) MCG/ACT inhaler Inhale 2 puffs into the lungs every 6 (six) hours as needed for wheezing or shortness of breath. 18 g 0  . anastrozole (ARIMIDEX) 1 MG tablet TAKE 1 TABLET BY MOUTH EVERY DAY 30 tablet 14  . atorvastatin (LIPITOR) 20 MG tablet TAKE 1 TABLET BY MOUTH EVERY DAY FOR CHOLESTEROL 90 tablet 1  . B-D UF III MINI PEN NEEDLES 31G X 5 MM MISC USE AS DIRECTED USE DAILY TO ADMINISTER VICTOZA 200 each 5  . Continuous  Blood Gluc Sensor (DEXCOM G6 SENSOR) MISC For continuous blood glucose monitoring 3 each 11  . Continuous Blood Gluc Transmit (DEXCOM G6 TRANSMITTER) MISC 1 Device by Does not apply route every 3 (three) months. (Patient not taking: Reported on 05/05/2019) 1 each 3  . cyclobenzaprine (FLEXERIL) 5 MG tablet Take 1 tablet (5 mg total) by mouth 3 (three) times daily as needed for muscle spasms. 15 tablet 0  . fluticasone (FLONASE) 50 MCG/ACT nasal spray USE 1 SPRAY IN BOTH  NOSTRILS TWICE DAILY AS  NEEDED FOR ALLERGIES OR  RHINITIS 48 g 2  . Fluticasone-Salmeterol (ADVAIR DISKUS) 250-50 MCG/DOSE AEPB Inhale 1 puff into the lungs 2 (two) times daily. 60 each 5  . insulin aspart (NOVOLOG FLEXPEN) 100 UNIT/ML FlexPen Inject 9-12 units before meals 30 mL 3  . insulin glargine (LANTUS SOLOSTAR) 100 UNIT/ML Solostar Pen Inject 35 Units into the skin daily. 45 mL 3  . Lancets (ONETOUCH DELICA PLUS [id number]) MISC Inject 1 Device into the skin 3 (three) times daily. To check blood sugar 300 each 2  . levothyroxine (SYNTHROID) 88 MCG tablet TAKE 1 AND 1/2 TABLET BY MOUTH EVERY SUNDAY. TAKE 1 TABLET BY MOUTH MON THROUGH SAT. TAKE ON AN EMPTY STOMACH WITH WATER ONLY. NO FOOD OR OTHER MEDICATIONS FOR 30 MINUTES. 98 tablet 1  . meloxicam (MOBIC) 15 MG tablet Take 1 tablet (15 mg total) by mouth daily. 30 tablet 1  . metFORMIN (GLUCOPHAGE-XR) 500 MG 24 hr tablet TAKE 2 TABLETS BY MOUTH EVERY MORNING 180 tablet 3  . montelukast (SINGULAIR) 10 MG tablet Take 1 tablet (10 mg total) by mouth at bedtime. For allergies and asthma. 90 tablet 1  . ONETOUCH VERIO test strip USE AS DIRECTED TO CHECK FASTING  BLOOD SUGARS THREE TIMES DAILY. Insulin Dependent Dx Code E11.9 100 strip 10  . Semaglutide, 1 MG/DOSE, (OZEMPIC, 1 MG/DOSE,) 2 MG/1.5ML SOPN Inject 1 mg into the skin once a week. 6 pen 3  . WIXELA INHUB 500-50 MCG/DOSE AEPB TAKE 1 PUFF BY MOUTH TWICE A DAY 60 each 0   No current facility-administered medications on file  prior to visit.   Allergies  Allergen Reactions  . 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 She described it was a small amount in a milk carton in [place]. She states they stopped using it shortly after because the trial wasn't successful. SPM   Family History  Problem Relation Age of Onset  . Alzheimer's disease Mother   . Asthma Mother   . Dementia Mother        cause of death  . Arthritis Father   . Heart attack Father 93       cause of death  . Breast cancer Sister 68  . Colon cancer Paternal Grandmother   . Liver cancer Maternal Uncle   . Colon polyps Neg Hx   . Rectal cancer Neg Hx   . Stomach cancer Neg Hx    PE: BP (!) 160/80   Pulse 79   Ht 5\' 5"  (1.651 m)   Wt 206 lb (93.4 kg)   SpO2 98%   BMI 34.28 kg/m  Wt Readings from Last 3 Encounters:  11/16/19 206 lb (93.4 kg)  08/02/19 213 lb (96.6 kg)  03/01/19 210 lb (95.3 kg)   Constitutional: overweight, in NAD Eyes: PERRLA, EOMI, no exophthalmos ENT: moist mucous membranes, no thyromegaly, no cervical lymphadenopathy Cardiovascular: RRR, No MRG Respiratory: CTA B Gastrointestinal: abdomen soft, NT, ND, BS+ Musculoskeletal: no deformities, strength intact in all 4 Skin: moist, warm, no rashes Neurological: no tremor with outstretched hands, DTR normal in all 4  ASSESSMENT: 1. DM2, insulin-dependent, uncontrolled, without long-term complications  2. HL  3. Obesity class 1  PLAN:  1. Patient with longstanding, uncontrolled, type 2 diabetes, on basal-bolus insulin regimen, also, Metformin ER and weekly GLP-1 receptor agonist with still poor control.  In the past, we tried a CGM but she was not able to start this before, but she started this since last visit.   -At last visit, her HbA1c was slightly better but sugars appeared worse, very fluctuating, between 47 500s.  Blood sugars are highest in the morning so we discussed about  adding a lower dose of Lantus at night.  We have to be very careful since she can also drop her sugars significantly with higher doses of insulin.  We did not change the dose of NovoLog as she was preparing to go on a plant-based diet.  She was reading Dr. Emilio Math book: Program for reversing diabetes-and was planning to start this diet as soon as possible.  At this visit, she tells me that she did switch to this diet as she loves it. CGM interpretation: -At today's visit, we reviewed together her Dexcom CGM download and it appears that her sugars still uncontrolled, but less fluctuating than before.  Her sugars appear to be approximately 43.7% in target range, with 55.9% of the values above 180.  There are clear patterns identified: Sugars are increasing after lunch almost every day and they are the highest at dinnertime.  They are dropping after dinner, sometimes too much and then they increase precipitously and stay high the entire  night but trending down after 2 AM.  Upon questioning, she is correcting the low blood sugars after dinner and I believe that this is the reason why her sugars are high throughout the night.  Therefore, we discussed about reducing her insulin with dinner and increasing her insulin for lunch.  Her insulin in the morning before her breakfast appears to be adequate but she is sometimes trending lower in the middle of the day and I advised her to stay with the lower dose of NovoLog.  -She did decrease the am dose of Lantus slightly since last visit and for now we can continue the lower dose.  We will also continue Metformin and Ozempic.  Ultimately, I feel that she would be a good candidate for an insulin pump.  We also may need carb counting teaching for better NovoLog dosing.  I plan to refer her to nutrition at next visit. - I suggested to:  Patient Instructions  Please continue: - Metformin ER 1000 mg with breakfast - Ozempic 1 mg weekly - Lantus 20 units in am and 10 units  at bedtime  Please change: - Novolog 15 min before the meals B'fast: 6-8 units  Lunch: 10-11 units Dinner: 6-7 units If sugars before meals <60, do not take Novolog before that meal. If sugars before meals <90, take 50% of the Novolog before that meal At bedtime, only correct sugars >250, and only with up to 5 units of Novolog.  Please return in 3 months with your sugar log.   - advised to check sugars at different times of the day - 3-4x a day, rotating check times - advised for yearly eye exams >> she is UTD - return to clinic in 3 months  2. HL -Reviewed latest lipid panel from 05/2019: Fractions at goal: Lab Results  Component Value Date   CHOL 174 06/06/2019   HDL 74.50 06/06/2019   LDLCALC 83 06/06/2019   TRIG 84.0 06/06/2019   CHOLHDL 2 06/06/2019  -Continues Lipitor without side effects  3.  Obesity class I -Continue Ozempic which should also help with weight loss -We discussed about starting a plant-based diet at last visit >> she adopted this and lost 7 pounds since last visit!   Philemon Kingdom, MD PhD [facility] Endocrinology

## 2019-11-16 ENCOUNTER — Encounter: Payer: Self-pay | Admitting: Internal Medicine

## 2019-11-16 ENCOUNTER — Other Ambulatory Visit: Payer: Self-pay

## 2019-11-16 ENCOUNTER — Ambulatory Visit (INDEPENDENT_AMBULATORY_CARE_PROVIDER_SITE_OTHER): Payer: Medicare Other | Admitting: Internal Medicine

## 2019-11-16 VITALS — BP 160/80 | HR 79 | Ht 65.0 in | Wt 206.0 lb

## 2019-11-16 DIAGNOSIS — E669 Obesity, unspecified: Secondary | ICD-10-CM | POA: Diagnosis not present

## 2019-11-16 DIAGNOSIS — E1165 Type 2 diabetes mellitus with hyperglycemia: Secondary | ICD-10-CM

## 2019-11-16 DIAGNOSIS — E785 Hyperlipidemia, unspecified: Secondary | ICD-10-CM

## 2019-11-16 DIAGNOSIS — Z794 Long term (current) use of insulin: Secondary | ICD-10-CM | POA: Diagnosis not present

## 2019-11-29 ENCOUNTER — Ambulatory Visit (INDEPENDENT_AMBULATORY_CARE_PROVIDER_SITE_OTHER): Payer: Medicare Other | Admitting: Primary Care

## 2019-11-29 ENCOUNTER — Encounter: Payer: Self-pay | Admitting: Primary Care

## 2019-11-29 ENCOUNTER — Other Ambulatory Visit: Payer: Self-pay

## 2019-11-29 ENCOUNTER — Ambulatory Visit (INDEPENDENT_AMBULATORY_CARE_PROVIDER_SITE_OTHER)
Admission: RE | Admit: 2019-11-29 | Discharge: 2019-11-29 | Disposition: A | Discharge status: Home / Self Care | Payer: Medicare Other | Source: Ambulatory Visit | Attending: Primary Care | Admitting: Primary Care

## 2019-11-29 VITALS — BP 122/74 | HR 84 | Temp 97.4°F | Ht 65.0 in | Wt 205.0 lb

## 2019-11-29 DIAGNOSIS — M25541 Pain in joints of right hand: Secondary | ICD-10-CM

## 2019-11-29 DIAGNOSIS — M79641 Pain in right hand: Secondary | ICD-10-CM | POA: Diagnosis not present

## 2019-11-29 DIAGNOSIS — E119 Type 2 diabetes mellitus without complications: Secondary | ICD-10-CM | POA: Diagnosis not present

## 2019-11-29 DIAGNOSIS — E039 Hypothyroidism, unspecified: Secondary | ICD-10-CM

## 2019-11-29 DIAGNOSIS — Z23 Encounter for immunization: Secondary | ICD-10-CM | POA: Diagnosis not present

## 2019-11-29 DIAGNOSIS — M25542 Pain in joints of left hand: Secondary | ICD-10-CM | POA: Diagnosis not present

## 2019-11-29 DIAGNOSIS — E2839 Other primary ovarian failure: Secondary | ICD-10-CM

## 2019-11-29 DIAGNOSIS — J302 Other seasonal allergic rhinitis: Secondary | ICD-10-CM | POA: Diagnosis not present

## 2019-11-29 DIAGNOSIS — G8929 Other chronic pain: Secondary | ICD-10-CM | POA: Diagnosis not present

## 2019-11-29 DIAGNOSIS — J45909 Unspecified asthma, uncomplicated: Secondary | ICD-10-CM

## 2019-11-29 DIAGNOSIS — E1165 Type 2 diabetes mellitus with hyperglycemia: Secondary | ICD-10-CM

## 2019-11-29 DIAGNOSIS — Z794 Long term (current) use of insulin: Secondary | ICD-10-CM

## 2019-11-29 LAB — C-REACTIVE PROTEIN: CRP: <1 mg/dL (ref 0.5–20.0)

## 2019-11-29 LAB — SEDIMENTATION RATE: Sed Rate: 17 mm/hr (ref 0–30)

## 2019-11-29 LAB — URIC ACID: Uric Acid, Serum: 4.6 mg/dL (ref 2.4–7.0)

## 2019-11-29 MED ORDER — FLUTICASONE PROPIONATE 50 MCG/ACT NA SUSP: NASAL | 0 refills | Status: DC

## 2019-11-29 MED ORDER — LEVOTHYROXINE SODIUM 88 MCG PO TABS: ORAL_TABLET | ORAL | 1 refills | Status: DC

## 2019-11-29 MED ORDER — DICLOFENAC SODIUM 1 % EX GEL: 2.0000 g | Freq: Three times a day (TID) | CUTANEOUS | 0 refills | Status: DC | PRN

## 2019-11-29 NOTE — Patient Instructions (Signed)
Stop by the lab and xray prior to leaving today. I will notify you of your results once received.   You may apply the diclofenac gel three times daily as needed for pain to the joints of the hands.  Schedule a visit with Dr. Lorelei Pont as discussed.  It was a pleasure to see you today!

## 2019-11-29 NOTE — Progress Notes (Signed)
Subjective:    Patient ID: Molly Wall, female    DOB: Jan 10, 1946, 74 y.o.   MRN: [id number]  HPI  This visit occurred during the SARS-CoV-2 public health emergency.  Safety protocols were in place, including screening questions prior to the visit, additional usage of staff PPE, and extensive cleaning of exam room while observing appropriate contact time as indicated for disinfecting solutions.   Molly Wall is a 74 year old female with a history of asthma, hypothyroidism, type 2 diabetes, chronic back pain who presents today with a chief complaint of hand pain.  She is also needing some medication refills.  Her pain is located to the bilateral thumbs, and right 2nd and 5th digits.  Symptoms initially began 1 year ago to the left 1st digit, but over the last 3 months she has noted symptoms to her right hand.  Symptoms include stiffness, decrease in range of motion, inability to straighten fingers. She has pain when applying weight to her hands, opening bottles. Her right thumb will lock often.  She's not taken anything OTC for symptoms.  She believes she has noticed some swelling and mild redness at times.  Wt Readings from Last 3 Encounters:  11/29/19 205 lb (93 kg)  11/16/19 206 lb (93.4 kg)  08/02/19 213 lb (96.6 kg)     Review of Systems  Musculoskeletal: Positive for arthralgias and joint swelling.  Skin: Positive for color change.  Neurological: Negative for numbness.       Past Medical History:  Diagnosis Date  . Allergic rhinitis   . Allergy   . Asthma   . Atypical ductal hyperplasia of right breast   . 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 Breast cancer ([facility])   . Diabetes mellitus without complication ([place])   . Family history of breast cancer   . Family history of colon cancer   . Hypercholesteremia   . Hypothyroidism   . Lumbar herniated disc   . OSA (obstructive sleep apnea)    used to use CPAP, not any more over 1 1/2 yrs  . Salivary  stone    left side  . Sleep apnea    no cpap in 2 yrs   . Vitamin D deficiency      Social History   Socioeconomic History  . Marital status: Divorced    Spouse name: Not on file  . Number of children: 2  . Years of education: Not on file  . Highest education level: Not on file  Occupational History  . Not on file  Tobacco Use  . Smoking status: Former Smoker    Packs/day: 1.00    Years: 2.00    Pack years: 2.00    Types: Cigarettes  . Smokeless tobacco: Never Used  . Tobacco comment: quite smoker at age 62  Vaping Use  . Vaping Use: Never used  Substance and Sexual Activity  . Alcohol use: Yes    Alcohol/week: 0.0 - 1.0 standard drinks    Comment: 0-1 per month  . Drug use: No  . Sexual activity: Not Currently  Other Topics Concern  . Not on file  Social History Narrative   Single.   Moved from [place] to [place] several weeks ago.   Family lives in [place].   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   Social Determinants of Health   Financial Resource Strain:   .  Difficulty of Paying Living Expenses: Not on file  Food Insecurity:   . Worried About Charity fundraiser in the Last Year: Not on file  . Ran Out of Food in the Last Year: Not on file  Transportation Needs:   . Lack of Transportation (Medical): Not on file  . Lack of Transportation (Non-Medical): Not on file  Physical Activity:   . Days of Exercise per Week: Not on file  . Minutes of Exercise per Session: Not on file  Stress:   . Feeling of Stress : Not on file  Social Connections:   . Frequency of Communication with Friends and Family: Not on file  . Frequency of Social Gatherings with Friends and Family: Not on file  . Attends Religious Services: Not on file  . Active Member of Clubs or Organizations: Not on file  . Attends Archivist Meetings: Not on file  . Marital Status: Not on file  Intimate  Partner Violence:   . Fear of Current or Ex-Partner: Not on file  . Emotionally Abused: Not on file  . Physically Abused: Not on file  . Sexually Abused: Not on file    Past Surgical History:  Procedure Laterality Date  . APPENDECTOMY    . BREAST LUMPECTOMY WITH RADIOACTIVE SEED LOCALIZATION Right 08/12/2017   Procedure: RIGHT BREAST LUMPECTOMY X'S 2 WITH RADIOACTIVE SEED LOCALIZATION X'S 2;  Surgeon: Fanny Skates, MD;  Location: [facility];  Service: General;  Laterality: Right;  . COLONOSCOPY     11 yrs ago in [place] but given a 10 yr recall per pt   . GANGLION CYST EXCISION     74 years old  . INCISION AND DRAINAGE ABSCESS ANAL    . KNEE SURGERY  2014  . SHOULDER SURGERY  1976    Family History  Problem Relation Age of Onset  . Alzheimer's disease Mother   . Asthma Mother   . Dementia Mother        cause of death  . Arthritis Father   . Heart attack Father 6       cause of death  . Breast cancer Sister 73  . Colon cancer Paternal Grandmother   . Liver cancer Maternal Uncle   . Colon polyps Neg Hx   . Rectal cancer Neg Hx   . Stomach cancer Neg Hx     Allergies  Allergen Reactions  . 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 She described it was a small amount in a milk carton in [place]. She states they stopped using it shortly after because the trial wasn't successful. SPM    Current Outpatient Medications on File Prior to Visit  Medication Sig Dispense Refill  . albuterol (PROAIR HFA) 108 (90 Base) MCG/ACT inhaler Inhale 2 puffs into the lungs every 6 (six) hours as needed for wheezing or shortness of breath. 18 g 0  . anastrozole (ARIMIDEX) 1 MG tablet TAKE 1 TABLET BY MOUTH EVERY DAY 30 tablet 14  . atorvastatin (LIPITOR) 20 MG tablet TAKE 1 TABLET BY MOUTH EVERY DAY FOR CHOLESTEROL 90 tablet 1  . Fluticasone-Salmeterol (ADVAIR DISKUS) 250-50 MCG/DOSE AEPB  Inhale 1 puff into the lungs 2 (two) times daily. 60 each 5  . insulin aspart (NOVOLOG FLEXPEN) 100 UNIT/ML FlexPen Inject 9-12 units before meals (Patient taking differently: 7-9 Units. Inject 9-12 units before meals) 30 mL 3  . insulin glargine (  LANTUS SOLOSTAR) 100 UNIT/ML Solostar Pen Inject 35 Units into the skin daily. (Patient taking differently: Inject 30 Units into the skin daily. ) 45 mL 3  . meloxicam (MOBIC) 15 MG tablet Take 1 tablet (15 mg total) by mouth daily. 30 tablet 1  . metFORMIN (GLUCOPHAGE-XR) 500 MG 24 hr tablet TAKE 2 TABLETS BY MOUTH EVERY MORNING 180 tablet 3  . montelukast (SINGULAIR) 10 MG tablet Take 1 tablet (10 mg total) by mouth at bedtime. For allergies and asthma. 90 tablet 1  . Semaglutide, 1 MG/DOSE, (OZEMPIC, 1 MG/DOSE,) 2 MG/1.5ML SOPN Inject 1 mg into the skin once a week. 6 pen 3  . WIXELA INHUB 500-50 MCG/DOSE AEPB TAKE 1 PUFF BY MOUTH TWICE A DAY 60 each 0   No current facility-administered medications on file prior to visit.    BP 122/74   Pulse 84   Temp (!) 97.4 F (36.3 C) (Temporal)   Ht 5\' 5"  (1.651 m)   Wt 205 lb (93 kg)   SpO2 97%   BMI 34.11 kg/m    Objective:   Physical Exam Pulmonary:     Effort: Pulmonary effort is normal.  Musculoskeletal:     Right hand: Swelling present. No bony tenderness. Decreased range of motion.     Left hand: No bony tenderness. Decreased range of motion.     Comments: Stiffness to right 1st PIP joint with decrease in range of motion.  Mild swelling without erythema.  Other joints without erythema swelling, but with mild decrease in range of motion to PIP and DIP joints.  Skin:    General: Skin is warm and dry.     Findings: No erythema.  Neurological:     Mental Status: She is alert.            Assessment & Plan:

## 2019-11-29 NOTE — Assessment & Plan Note (Signed)
Following with endocrinology, overall improved. She may have her repeat A1c done in our office next month.  Will order at that time.

## 2019-11-29 NOTE — Assessment & Plan Note (Signed)
Refill provided for Flonase.

## 2019-11-29 NOTE — Assessment & Plan Note (Signed)
Chronic to left hand for 1 year, now chronic to right hand x3 months.  Exam today consistent for osteoarthritis, but will rule out autoimmune arthritis and gout given history.  Labs pending. Checking x-ray of the right hand today.  Prescription for diclofenac gel sent to pharmacy. She will schedule a visit with our sports medicine doctor to discuss trigger finger.

## 2019-11-30 LAB — RHEUMATOID FACTOR: Rheumatoid fact SerPl-aCnc: <14 IU/mL (ref <14)

## 2019-11-30 LAB — CYCLIC CITRUL PEPTIDE ANTIBODY, IGG: Cyclic Citrullin Peptide Ab: 104 UNITS — ABNORMAL HIGH

## 2019-12-01 DIAGNOSIS — M9903 Segmental and somatic dysfunction of lumbar region: Secondary | ICD-10-CM | POA: Diagnosis not present

## 2019-12-01 DIAGNOSIS — M9904 Segmental and somatic dysfunction of sacral region: Secondary | ICD-10-CM | POA: Diagnosis not present

## 2019-12-01 DIAGNOSIS — M9901 Segmental and somatic dysfunction of cervical region: Secondary | ICD-10-CM | POA: Diagnosis not present

## 2019-12-01 DIAGNOSIS — M542 Cervicalgia: Secondary | ICD-10-CM | POA: Diagnosis not present

## 2019-12-03 DIAGNOSIS — M25542 Pain in joints of left hand: Secondary | ICD-10-CM

## 2019-12-03 DIAGNOSIS — M0580 Other rheumatoid arthritis with rheumatoid factor of unspecified site: Secondary | ICD-10-CM

## 2019-12-03 DIAGNOSIS — M25541 Pain in joints of right hand: Secondary | ICD-10-CM

## 2019-12-13 ENCOUNTER — Encounter: Payer: Self-pay | Admitting: Internal Medicine

## 2019-12-13 ENCOUNTER — Other Ambulatory Visit: Payer: Self-pay | Admitting: Internal Medicine

## 2019-12-13 MED ORDER — GLUCAGON 3 MG/DOSE NA POWD: 3.0000 mg | Freq: Once | NASAL | 99 refills | Status: DC | PRN

## 2019-12-14 DIAGNOSIS — G4733 Obstructive sleep apnea (adult) (pediatric): Secondary | ICD-10-CM | POA: Diagnosis not present

## 2019-12-15 DIAGNOSIS — M9903 Segmental and somatic dysfunction of lumbar region: Secondary | ICD-10-CM | POA: Diagnosis not present

## 2019-12-15 DIAGNOSIS — M9904 Segmental and somatic dysfunction of sacral region: Secondary | ICD-10-CM | POA: Diagnosis not present

## 2019-12-15 DIAGNOSIS — M9901 Segmental and somatic dysfunction of cervical region: Secondary | ICD-10-CM | POA: Diagnosis not present

## 2019-12-15 DIAGNOSIS — M542 Cervicalgia: Secondary | ICD-10-CM | POA: Diagnosis not present

## 2019-12-19 ENCOUNTER — Other Ambulatory Visit: Payer: Self-pay | Admitting: *Deleted

## 2019-12-19 ENCOUNTER — Other Ambulatory Visit: Payer: Self-pay | Admitting: Internal Medicine

## 2019-12-19 MED ORDER — INSULIN PEN NEEDLE 32G X 4 MM MISC: 3 refills | Status: DC

## 2019-12-19 NOTE — Progress Notes (Signed)
Pt left VM stating she needs to have MRI done.  This RN noted order in place and [facility] to verify order as viewable.  Per Littley at the [facility]- she states they can see the order and called her Friday 10/ to schedule- left her a message.  Per this conversation - Ledell Noss will call pt again.

## 2019-12-29 ENCOUNTER — Telehealth: Payer: Self-pay | Admitting: Primary Care

## 2019-12-29 NOTE — Telephone Encounter (Signed)
Patient's requesting Molly Wall call her back.  She wouldn't give any details.

## 2019-12-30 ENCOUNTER — Other Ambulatory Visit: Payer: Self-pay

## 2019-12-30 ENCOUNTER — Encounter: Payer: Self-pay | Admitting: Primary Care

## 2019-12-30 ENCOUNTER — Other Ambulatory Visit (INDEPENDENT_AMBULATORY_CARE_PROVIDER_SITE_OTHER): Payer: Medicare Other

## 2019-12-30 DIAGNOSIS — Z1152 Encounter for screening for COVID-19: Secondary | ICD-10-CM

## 2019-12-30 NOTE — Telephone Encounter (Signed)
Called patient she requested letter that she had covid and requested an antibody test. Both were approved by Anda Kraft. App for lab made to day and copy of letter left at reception for patient to pick up.

## 2020-01-02 LAB — SARS-COV-2 ANTIBODY(IGG)SPIKE,SEMI-QUANTITATIVE: SARS COV1 AB(IGG)SPIKE,SEMI QN: <1 index (ref <1.00)

## 2020-01-10 ENCOUNTER — Other Ambulatory Visit: Payer: BLUE CROSS/BLUE SHIELD

## 2020-01-12 ENCOUNTER — Ambulatory Visit: Payer: BC Managed Care – PPO | Admitting: Oncology

## 2020-01-18 ENCOUNTER — Ambulatory Visit
Admission: RE | Admit: 2020-01-18 | Discharge: 2020-01-18 | Disposition: A | Discharge status: Home / Self Care | Payer: Medicare Other | Source: Ambulatory Visit | Attending: Oncology | Admitting: Oncology

## 2020-01-18 ENCOUNTER — Other Ambulatory Visit: Payer: Self-pay

## 2020-01-18 DIAGNOSIS — N6091 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: Secondary | ICD-10-CM

## 2020-01-18 MED ORDER — GADOBUTROL 1 MMOL/ML IV SOLN
10.0000 mL | Freq: Once | INTRAVENOUS | Status: AC | PRN
  Administered 2020-01-18: 10 mL via INTRAVENOUS

## 2020-02-11 ENCOUNTER — Other Ambulatory Visit: Payer: Self-pay | Admitting: Primary Care

## 2020-02-11 DIAGNOSIS — E785 Hyperlipidemia, unspecified: Secondary | ICD-10-CM

## 2020-02-16 ENCOUNTER — Other Ambulatory Visit: Payer: Self-pay | Admitting: Primary Care

## 2020-02-16 DIAGNOSIS — E119 Type 2 diabetes mellitus without complications: Secondary | ICD-10-CM

## 2020-02-16 DIAGNOSIS — Z794 Long term (current) use of insulin: Secondary | ICD-10-CM

## 2020-02-17 NOTE — Telephone Encounter (Signed)
Pharmacy requests refill on: Metformin HCL ER 500 mg   LAST REFILL: 02/03/2019 (Q-180, R-3) LAST OV: 11/29/2019 NEXT OV: Not Scheduled  PHARMACY: [id number] [place], [place]  Hgb A1C (06/06/2019): 9.4

## 2020-02-20 ENCOUNTER — Other Ambulatory Visit: Payer: Self-pay | Admitting: Primary Care

## 2020-02-20 DIAGNOSIS — J302 Other seasonal allergic rhinitis: Secondary | ICD-10-CM

## 2020-02-21 ENCOUNTER — Other Ambulatory Visit: Payer: Self-pay

## 2020-02-21 ENCOUNTER — Encounter: Payer: Self-pay | Admitting: Primary Care

## 2020-02-21 ENCOUNTER — Telehealth (INDEPENDENT_AMBULATORY_CARE_PROVIDER_SITE_OTHER): Payer: Medicare Other | Admitting: Primary Care

## 2020-02-21 ENCOUNTER — Other Ambulatory Visit (INDEPENDENT_AMBULATORY_CARE_PROVIDER_SITE_OTHER): Payer: Medicare Other

## 2020-02-21 VITALS — Ht 65.0 in | Wt 205.0 lb

## 2020-02-21 DIAGNOSIS — E538 Deficiency of other specified B group vitamins: Secondary | ICD-10-CM

## 2020-02-21 DIAGNOSIS — E039 Hypothyroidism, unspecified: Secondary | ICD-10-CM | POA: Diagnosis not present

## 2020-02-21 DIAGNOSIS — J019 Acute sinusitis, unspecified: Secondary | ICD-10-CM | POA: Diagnosis not present

## 2020-02-21 DIAGNOSIS — Z789 Other specified health status: Secondary | ICD-10-CM | POA: Insufficient documentation

## 2020-02-21 DIAGNOSIS — E559 Vitamin D deficiency, unspecified: Secondary | ICD-10-CM

## 2020-02-21 DIAGNOSIS — R4789 Other speech disturbances: Secondary | ICD-10-CM | POA: Diagnosis not present

## 2020-02-21 DIAGNOSIS — J0110 Acute frontal sinusitis, unspecified: Secondary | ICD-10-CM | POA: Insufficient documentation

## 2020-02-21 DIAGNOSIS — R413 Other amnesia: Secondary | ICD-10-CM | POA: Insufficient documentation

## 2020-02-21 DIAGNOSIS — Z1152 Encounter for screening for COVID-19: Secondary | ICD-10-CM

## 2020-02-21 MED ORDER — AMOXICILLIN-POT CLAVULANATE 875-125 MG PO TABS: 1.0000 | ORAL_TABLET | Freq: Two times a day (BID) | ORAL | 0 refills | Status: DC

## 2020-02-21 NOTE — Progress Notes (Signed)
Subjective:    Patient ID: Molly Wall, female    DOB: [birth year], 74 y.o.   MRN: [id number]  HPI  Virtual Visit via Video Note  I connected with Molly Wall on 02/21/20 at  9:20 AM EST by a video enabled telemedicine application and verified that I am speaking with the correct person using two identifiers.  Location: Patient: Home Provider: Office Participants: Patient and myself   I discussed the limitations of evaluation and management by telemedicine and the availability of in person appointments. The patient expressed understanding and agreed to proceed.  History of Present Illness:  Molly Wall is a 74 year old female with a history of asthma, type 2 diabetes, seasonal allergies, Covid-19 infection who presents today with a chief complaint of sinus pressure. She would also like to discuss other symptoms.    About six weeks ago she started to feel "fuzzy headed" that has been intermittent. She's had a hard time finding words, thoughts, using inappropriate words in conversation, decreased short term memory. She's concerned as she had a strong family history of dementia.   Also with brittle, weak, soft fingernails that break off in "layers". She's questioning whether her vegan diet has caused the "fuzzy headed" feeling, drops in glucose readings, brittle nails, etc. She is compliant to her levothyroxine, takes every morning on an empty stomach with water only, doesn't take or eat anything for 30 min.  She's been vegan for the last 6 months in an attempt to improve her diabetes. She's found drops in glucose readings during the night with a gradual rise when she wakes during the morning. She's unsure of why she has rises in glucose levels in the morning before eating. She does follow with endocrinology.   About 2 weeks ago she began to notice sinus headaches which are located to the bilateral frontal lobes. She's been using her Neti Pot but the rinse is blocked. Now her  symptoms have progressed, she's feeling more fatigued and tired. Her granddaughter had the same sort of symptoms, was treated for sinusitis.   She denies loss of taste/smell, diarrhea. She had Covid-19 virus in November 2020, is not vaccinated against Covid-19.   Observations/Objective:  Alert and oriented. Appears well, not sickly. No distress. Speaking in complete sentences. No cough.   Assessment and Plan:  See problem based charting.  Follow Up Instructions:  Start Augmentin antibiotics for the infection Take 1 tablet by mouth twice daily for 10 days.  Continue Flonase and neti pot rinses.  Call the office to set up a lab appointment as discussed.  It was a pleasure to see you today! Allie Bossier, NP-C    I discussed the assessment and treatment plan with the patient. The patient was provided an opportunity to ask questions and all were answered. The patient agreed with the plan and demonstrated an understanding of the instructions.   The patient was advised to call back or seek an in-person evaluation if the symptoms worsen or if the condition fails to improve as anticipated.    [name], NP    Review of Systems  Constitutional: Negative for chills, fatigue and fever.  HENT: Positive for congestion, ear pain, sinus pressure and sinus pain. Negative for postnasal drip and sore throat.   Respiratory: Negative for cough.   Gastrointestinal: Negative for diarrhea.  Allergic/Immunologic: Positive for environmental allergies.       Past Medical History:  Diagnosis Date  . Allergic rhinitis   . Allergy   .  Asthma   . Atypical ductal hyperplasia of right breast   . 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 Breast cancer ([place])   . Diabetes mellitus without complication ([place])   . Family history of breast cancer   . Family history of colon cancer   . Hypercholesteremia   . Hypothyroidism   . Lumbar herniated disc   . OSA (obstructive sleep  apnea)    used to use CPAP, not any more over 1 1/2 yrs  . Salivary stone    left side  . Sleep apnea    no cpap in 2 yrs   . Vitamin D deficiency      Social History   Socioeconomic History  . Marital status: Divorced    Spouse name: Not on file  . Number of children: 2  . Years of education: Not on file  . Highest education level: Not on file  Occupational History  . Not on file  Tobacco Use  . Smoking status: Former Smoker    Packs/day: 1.00    Years: 2.00    Pack years: 2.00    Types: Cigarettes  . Smokeless tobacco: Never Used  . Tobacco comment: quite smoker at age 5  Vaping Use  . Vaping Use: Never used  Substance and Sexual Activity  . Alcohol use: Yes    Alcohol/week: 0.0 - 1.0 standard drinks    Comment: 0-1 per month  . Drug use: No  . Sexual activity: Not Currently  Other Topics Concern  . Not on file  Social History Narrative   Single.   Moved from [place] to [place] several weeks ago.   Family lives in [place].   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   Social Determinants of Health   Financial Resource Strain:   . Difficulty of Paying Living Expenses: Not on file  Food Insecurity:   . Worried About Charity fundraiser in the Last Year: Not on file  . Ran Out of Food in the Last Year: Not on file  Transportation Needs:   . Lack of Transportation (Medical): Not on file  . Lack of Transportation (Non-Medical): Not on file  Physical Activity:   . Days of Exercise per Week: Not on file  . Minutes of Exercise per Session: Not on file  Stress:   . Feeling of Stress : Not on file  Social Connections:   . Frequency of Communication with Friends and Family: Not on file  . Frequency of Social Gatherings with Friends and Family: Not on file  . Attends Religious Services: Not on file  . Active Member of Clubs or Organizations: Not on file  . Attends Theatre manager Meetings: Not on file  . Marital Status: Not on file  Intimate Partner Violence:   . Fear of Current or Ex-Partner: Not on file  . Emotionally Abused: Not on file  . Physically Abused: Not on file  . Sexually Abused: Not on file    Past Surgical History:  Procedure Laterality Date  . APPENDECTOMY    . BREAST LUMPECTOMY WITH RADIOACTIVE SEED LOCALIZATION Right 08/12/2017   Procedure: RIGHT BREAST LUMPECTOMY X'S 2 WITH RADIOACTIVE SEED LOCALIZATION X'S 2;  Surgeon: Fanny Skates, MD;  Location: [facility];  Service: General;  Laterality: Right;  . COLONOSCOPY     11 yrs ago in [place] but given a 10  yr recall per pt   . GANGLION CYST EXCISION     74 years old  . INCISION AND DRAINAGE ABSCESS ANAL    . KNEE SURGERY  2014  . SHOULDER SURGERY  1976    Family History  Problem Relation Age of Onset  . Alzheimer's disease Mother   . Asthma Mother   . Dementia Mother        cause of death  . Arthritis Father   . Heart attack Father 97       cause of death  . Breast cancer Sister 66  . Colon cancer Paternal Grandmother   . Liver cancer Maternal Uncle   . Colon polyps Neg Hx   . Rectal cancer Neg Hx   . Stomach cancer Neg Hx     Allergies  Allergen Reactions  . 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 She described it was a small amount in a milk carton in [place]. She states they stopped using it shortly after because the trial wasn't successful. SPM    Current Outpatient Medications on File Prior to Visit  Medication Sig Dispense Refill  . albuterol (PROAIR HFA) 108 (90 Base) MCG/ACT inhaler Inhale 2 puffs into the lungs every 6 (six) hours as needed for wheezing or shortness of breath. 18 g 0  . anastrozole (ARIMIDEX) 1 MG tablet TAKE 1 TABLET BY MOUTH EVERY DAY 30 tablet 14  . atorvastatin (LIPITOR) 20 MG tablet TAKE 1 TABLET BY MOUTH EVERY DAY FOR  CHOLESTEROL 90 tablet 1  . fluticasone (FLONASE) 50 MCG/ACT nasal spray INSTILL 1 SPRAY IN EACH NOSTRIL TWICE A DAY AS NEEDED FOR ALLERGIES OR RHINITIS 48 mL 0  . Fluticasone-Salmeterol (ADVAIR DISKUS) 250-50 MCG/DOSE AEPB Inhale 1 puff into the lungs 2 (two) times daily. 60 each 5  . Glucagon 3 MG/DOSE POWD Place 3 mg into the nose once as needed for up to 1 dose. 1 each prn  . insulin aspart (NOVOLOG FLEXPEN) 100 UNIT/ML FlexPen Inject 9-12 units before meals (Patient taking differently: 7-9 Units. Inject 9-12 units before meals) 30 mL 3  . insulin glargine (LANTUS SOLOSTAR) 100 UNIT/ML Solostar Pen Inject 35 Units into the skin daily. (Patient taking differently: Inject 30 Units into the skin daily. ) 45 mL 3  . Insulin Pen Needle 32G X 4 MM MISC Use 5-6x a day for insulin injections 500 each 3  . levothyroxine (SYNTHROID) 88 MCG tablet TAKE 1 AND 1/2 TABLET BY MOUTH EVERY SUNDAY. TAKE 1 TABLET BY MOUTH MON THROUGH SAT. TAKE ON AN EMPTY STOMACH WITH WATER ONLY. NO FOOD OR OTHER MEDICATIONS FOR 30 MINUTES. 98 tablet 1  . metFORMIN (GLUCOPHAGE-XR) 500 MG 24 hr tablet TAKE 2 TABLETS BY MOUTH EVERY DAY IN THE MORNING 60 tablet 5  . montelukast (SINGULAIR) 10 MG tablet Take 1 tablet (10 mg total) by mouth at bedtime. For allergies and asthma. 90 tablet 1  . Semaglutide, 1 MG/DOSE, (OZEMPIC, 1 MG/DOSE,) 2 MG/1.5ML SOPN Inject 1 mg into the skin once a week. 6 pen 3  . WIXELA INHUB 500-50 MCG/DOSE AEPB TAKE 1 PUFF BY MOUTH TWICE A DAY (Patient taking differently: Inhale 1 puff into the lungs daily. ) 60 each 0  . diclofenac Sodium (VOLTAREN) 1 % GEL Apply 2 g topically 3 (three) times daily as needed. (Patient not taking: Reported on 02/21/2020) 100 g 0   No current facility-administered medications on file prior to  visit.    Ht 5\' 5"  (1.651 m)   Wt 205 lb (93 kg)   BMI 34.11 kg/m    Objective:   Physical Exam Constitutional:      General: She is not in acute distress.    Appearance: She is not  ill-appearing.  Pulmonary:     Effort: Pulmonary effort is normal.     Comments: No cough during visit Neurological:     Mental Status: She is alert and oriented to person, place, and time.     Comments: Carried on conversation well, one time could not find a word she intended to use, otherwise normal conversation   Psychiatric:        Mood and Affect: Mood normal.            Assessment & Plan:

## 2020-02-21 NOTE — Assessment & Plan Note (Signed)
For about 6 months, not taking B12, is taking vitamin D.  Labs for CBC, B12 and D pending.

## 2020-02-21 NOTE — Assessment & Plan Note (Signed)
She is taking levothyroxine with water on empty stomach, no other meds or food for 30 min, repeat TSH pending.

## 2020-02-21 NOTE — Assessment & Plan Note (Signed)
Two weeks of symptoms that are progressing. Rx for Augmentin course sent to pharmacy. She will update.

## 2020-02-21 NOTE — Assessment & Plan Note (Signed)
On a vegan diet, is taking Vitamin D3, repeat levels pending.

## 2020-02-21 NOTE — Patient Instructions (Signed)
Start Augmentin antibiotics for the infection Take 1 tablet by mouth twice daily for 10 days.  Continue Flonase and neti pot rinses.  Call the office to set up a lab appointment as discussed.  It was a pleasure to see you today! Allie Bossier, NP-C

## 2020-02-21 NOTE — Assessment & Plan Note (Signed)
Not significant during exam today, no other major neurological deficit noted during our video visit.  Checking labs today. Doesn't appear to be stressed/anxious otherwise.

## 2020-02-22 LAB — VITAMIN D 25 HYDROXY (VIT D DEFICIENCY, FRACTURES): VITD: 23.16 ng/mL — ABNORMAL LOW (ref 30.00–100.00)

## 2020-02-22 LAB — CBC
HCT: 42.5 % (ref 36.0–46.0)
Hemoglobin: 14 g/dL (ref 12.0–15.0)
MCHC: 33 g/dL (ref 30.0–36.0)
MCV: 84.9 fl (ref 78.0–100.0)
Platelets: 249 10*3/uL (ref 150.0–400.0)
RBC: 5 Mil/uL (ref 3.87–5.11)
RDW: 13 % (ref 11.5–15.5)
WBC: 5.9 10*3/uL (ref 4.0–10.5)

## 2020-02-22 LAB — TSH: TSH: 0.76 u[IU]/mL (ref 0.35–4.50)

## 2020-02-22 LAB — VITAMIN B12: Vitamin B-12: 318 pg/mL (ref 211–911)

## 2020-02-28 ENCOUNTER — Encounter: Payer: Self-pay | Admitting: Internal Medicine

## 2020-02-28 NOTE — Telephone Encounter (Signed)
I called numbers provided.  The first was for [facility]- which patient is not a member of.  The second was a Advertising account executive for [facility]- I left a message on the line for her to call me back to discuss what was needed for the patient to receive her transmitter.   Left call back number.

## 2020-02-28 NOTE — Telephone Encounter (Signed)
Please call patient at [phone] when documentation is sent to [facility] so that she can call them and pay to expedite delivery

## 2020-02-28 NOTE — Telephone Encounter (Signed)
Patient called to provide contact information for [facility] in order to get her Dexcom transmitter.  Contact numbers are:   [phone]

## 2020-03-19 ENCOUNTER — Encounter: Payer: Self-pay | Admitting: Internal Medicine

## 2020-03-19 ENCOUNTER — Ambulatory Visit (INDEPENDENT_AMBULATORY_CARE_PROVIDER_SITE_OTHER): Payer: Medicare Other | Admitting: Internal Medicine

## 2020-03-19 ENCOUNTER — Other Ambulatory Visit: Payer: Self-pay

## 2020-03-19 VITALS — BP 162/88 | HR 71 | Ht 65.0 in | Wt 197.8 lb

## 2020-03-19 DIAGNOSIS — E785 Hyperlipidemia, unspecified: Secondary | ICD-10-CM | POA: Diagnosis not present

## 2020-03-19 DIAGNOSIS — E66811 Obesity, class 1: Secondary | ICD-10-CM

## 2020-03-19 DIAGNOSIS — E669 Obesity, unspecified: Secondary | ICD-10-CM

## 2020-03-19 DIAGNOSIS — E1165 Type 2 diabetes mellitus with hyperglycemia: Secondary | ICD-10-CM

## 2020-03-19 DIAGNOSIS — Z794 Long term (current) use of insulin: Secondary | ICD-10-CM

## 2020-03-19 LAB — POCT GLYCOSYLATED HEMOGLOBIN (HGB A1C): Hemoglobin A1C: 8.7 % — AB (ref 4.0–5.6)

## 2020-03-19 MED ORDER — V-GO 30 KIT: PACK | 3 refills | Status: DC

## 2020-03-19 MED ORDER — INSULIN ASPART 100 UNIT/ML ~~LOC~~ SOLN: SUBCUTANEOUS | 99 refills | Status: DC

## 2020-03-19 NOTE — Patient Instructions (Addendum)
Please continue: - Metformin ER 1000 mg with breakfast - Ozempic 1 mg weekly - Lantus 20 units in am and 10 units at bedtime  Please increase: - Novolog 15 min before the meals B'fast: 8-10 units  Lunch: 10-11 units Dinner: 8-10 units If sugars before meals <60, do not take Novolog before that meal. If sugars before meals <90, try to move the bolus right before the meal At bedtime, only correct sugars >250, and only with up to 5 units of Novolog.  Please schedule an appt with Cristy Folks for diabetes education for VGo use.  When you switch to VGo, use 4-5 clicks per meal and 2-3 clicks for correction at night.  Please return in 3 months with your sugar log.

## 2020-03-19 NOTE — Progress Notes (Signed)
Patient ID: Molly Wall, female   DOB: [birth year], 75 y.o.   MRN: [id number]   This visit occurred during the SARS-CoV-2 public health emergency.  Safety protocols were in place, including screening questions prior to the visit, additional usage of staff PPE, and extensive cleaning of exam room while observing appropriate contact time as indicated for disinfecting solutions.   HPI: Molly Wall is a 75 y.o.-year-old female, initially referred by her PCP, Alma Friendly, MD, returning for follow-up for DM2, dx at 70 (2017), h/o reactive hypoglycemia, insulin-dependent, uncontrolled, without long-term complications.  Last visit 4 months ago.  Before last visit, she started on a low-fat mostly plant-based diet and was feeling much better.  She lost weight and sugars improved, but they were still variable.  Reviewed HbA1c levels: Lab Results  Component Value Date   HGBA1C 8.2 (A) 09/07/2019   HGBA1C 9.4 (H) 06/06/2019   HGBA1C 9.1 (A) 02/08/2019   HGBA1C 9.6 (A) 10/27/2018   HGBA1C 8.2 (A) 07/30/2018   HGBA1C 11.1 (A) 04/27/2018   HGBA1C 11.3 (H) 12/09/2017   HGBA1C 6.8 (H) 04/10/2016   HGBA1C 6.6 (H) 01/09/2016   HGBA1C 6.4 07/16/2015   She is on: - Metformin ER 1000 mg with breakfast - Ozempic 0.5 >> 1 mg weekly - Lantus 35 >> 25 units in am >> 25 units in a.m. and 10 units at bedtime >> 20 units in a.m. and 10 units at night - Novolog 7-9 units before 15 min before meals >> B'fast: 6-8 units  Lunch: 10-11 units Dinner: 6-7 units If sugars before meals <60, do not take Novolog before that meal. If sugars before meals <90, take 50% of the Novolog before that meal At bedtime, only correct sugars >250, and only with up to 5 units of Novolog.  She checks her sugars more than 4 times a day with her CGM.  CGM parameters: - Average from CGM: 197+/-69 >> 221 +/-77 - Percent time CGM active: 97.7 - Coefficient of variation 35%  Time in range:  - very low (<54): 0% >> 0% -  low (<70): 0.4% >> 0.1% - normal range (70-180): 43.7% >> 36.5% - high sugars (>180): 55.9% >> 63.4% - very high sugars (>250): 23.4% >> 36.1      Previously:   Previously: - am: 82-374 >> 141-239, 333 >> 159-338 - 2h after b'fast: 240 >> n/c >> 197 - before lunch: 70-240, 376 >> 84-269 >> 68-325, 506 - 2h after lunch: 100-126 >> n/c - before dinner: 87-448, 578 >> 100-306, 419 >> 95-356 - 2h after dinner: 267, 289 >> n/c - bedtime:55, 166,  286 >> 191-436 (ave 200s) >> 227-431 - nighttime: n/c Lowest sugar was 55 >> 84 >> 47 >> 42 x1; she has hypoglycemia awareness in the 80s. Highest sugar was 578  >>  436 >> 591 >> 400s.  Glucometer: One Touch Verio  -No CKD, last BUN/creatinine:  Lab Results  Component Value Date   BUN 21 01/06/2019   BUN 17 12/30/2018   CREATININE 0.82 01/06/2019   CREATININE 0.71 12/30/2018  Not on ACE inhibitor/ARB.  -No HL; last set of lipids: Lab Results  Component Value Date   CHOL 174 06/06/2019   HDL 74.50 06/06/2019   LDLCALC 83 06/06/2019   TRIG 84.0 06/06/2019   CHOLHDL 2 06/06/2019  On Lipitor 20.  - last eye exam was in 06/2019: No DR  -No numbness and tingling in her feet.  Pt has FH of DM in [place].  She has OSA and started a CPAP after which she felt much better.  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    She is on levothyroxine 88 mcg 1 tablet 6/7 days and 1.5 tablets 1/7 days.    Latest TSH was normal: Lab Results  Component Value Date   TSH 0.76 02/21/2020   She lives alone.  ROS: Constitutional: no weight gain/no weight loss, no fatigue, no subjective hyperthermia, no subjective hypothermia Eyes: no blurry vision, no xerophthalmia ENT: no sore throat, no nodules palpated in neck, no dysphagia, no odynophagia, no  hoarseness Cardiovascular: no CP/no SOB/no palpitations/no leg swelling Respiratory: no cough/no SOB/no wheezing Gastrointestinal: no N/no V/no D/no C/no acid reflux Musculoskeletal: no muscle aches/no joint aches Skin: no rashes, no hair loss Neurological: no tremors/no numbness/no tingling/no dizziness  I reviewed pt's medications, allergies, PMH, social hx, family hx, and changes were documented in the history of present illness. Otherwise, unchanged from my initial visit note.  Past Medical History:  Diagnosis Date  . Allergic rhinitis   . Allergy   . Asthma   . Atypical ductal hyperplasia of right breast   . 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 Breast cancer ([facility])   . Diabetes mellitus without complication ([facility])   . Family history of breast cancer   . Family history of colon cancer   . Hypercholesteremia   . Hypothyroidism   . Lumbar herniated disc   . OSA (obstructive sleep apnea)    used to use CPAP, not any more over 1 1/2 yrs  . Salivary stone    left side  . Sleep apnea    no cpap in 2 yrs   . Vitamin D deficiency    Past Surgical History:  Procedure Laterality Date  . APPENDECTOMY    . BREAST LUMPECTOMY WITH RADIOACTIVE SEED LOCALIZATION Right 08/12/2017   Procedure: RIGHT BREAST LUMPECTOMY X'S 2 WITH RADIOACTIVE SEED LOCALIZATION X'S 2;  Surgeon: Fanny Skates, MD;  Location: [facility];  Service: General;  Laterality: Right;  . COLONOSCOPY     11 yrs ago in [place] but given a 10 yr recall per pt   . GANGLION CYST EXCISION     75 years old  . INCISION AND DRAINAGE ABSCESS ANAL    . KNEE SURGERY  2014  . SHOULDER SURGERY  1976   Social History   Socioeconomic History  . Marital status: Divorced    Spouse name: Not on file  . Number of children: 2  . Years of education: Not on file  . Highest education level: Not on file  Occupational History  . Not on file  Tobacco Use  . Smoking status: Former Smoker     Packs/day: 1.00    Years: 2.00    Pack years: 2.00    Types: Cigarettes  . Smokeless tobacco: Never Used  . Tobacco comment: quite smoker at age 75  Substance and Sexual Activity  . Alcohol use: Yes    Alcohol/week: 0.0 - 1.0 standard drinks    Comment: 0-1 per month  . Drug use: No  . Sexual activity: Not Currently  Other Topics Concern  . Not on file  Social History Narrative   Single.   Moved from [place] to [place].   Family  lives in [place].   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   Social Determinants of Health   Financial Resource Strain:   . Difficulty of Paying Living Expenses: Not on file  Food Insecurity:   . Worried About Charity fundraiser in the Last Year: Not on file  . Ran Out of Food in the Last Year: Not on file  Transportation Needs:   . Lack of Transportation (Medical): Not on file  . Lack of Transportation (Non-Medical): Not on file  Physical Activity:   . Days of Exercise per Week: Not on file  . Minutes of Exercise per Session: Not on file  Stress:   . Feeling of Stress : Not on file  Social Connections:   . Frequency of Communication with Friends and Family: Not on file  . Frequency of Social Gatherings with Friends and Family: Not on file  . Attends Religious Services: Not on file  . Active Member of Clubs or Organizations: Not on file  . Attends Archivist Meetings: Not on file  . Marital Status: Not on file  Intimate Partner Violence:   . Fear of Current or Ex-Partner: Not on file  . Emotionally Abused: Not on file  . Physically Abused: Not on file  . Sexually Abused: Not on file   Current Outpatient Medications on File Prior to Visit  Medication Sig Dispense Refill  . albuterol (PROAIR HFA) 108 (90 Base) MCG/ACT inhaler Inhale 2 puffs into the lungs every 6 (six) hours as needed for wheezing or shortness of breath. 18 g 0  .  amoxicillin-clavulanate (AUGMENTIN) 875-125 MG tablet Take 1 tablet by mouth 2 (two) times daily. 20 tablet 0  . anastrozole (ARIMIDEX) 1 MG tablet TAKE 1 TABLET BY MOUTH EVERY DAY 30 tablet 14  . atorvastatin (LIPITOR) 20 MG tablet TAKE 1 TABLET BY MOUTH EVERY DAY FOR CHOLESTEROL 90 tablet 1  . diclofenac Sodium (VOLTAREN) 1 % GEL Apply 2 g topically 3 (three) times daily as needed. (Patient not taking: Reported on 02/21/2020) 100 g 0  . fluticasone (FLONASE) 50 MCG/ACT nasal spray INSTILL 1 SPRAY IN EACH NOSTRIL TWICE A DAY AS NEEDED FOR ALLERGIES OR RHINITIS 48 mL 0  . Fluticasone-Salmeterol (ADVAIR DISKUS) 250-50 MCG/DOSE AEPB Inhale 1 puff into the lungs 2 (two) times daily. 60 each 5  . Glucagon 3 MG/DOSE POWD Place 3 mg into the nose once as needed for up to 1 dose. 1 each prn  . insulin aspart (NOVOLOG FLEXPEN) 100 UNIT/ML FlexPen Inject 9-12 units before meals (Patient taking differently: 7-9 Units. Inject 9-12 units before meals) 30 mL 3  . insulin glargine (LANTUS SOLOSTAR) 100 UNIT/ML Solostar Pen Inject 35 Units into the skin daily. (Patient taking differently: Inject 30 Units into the skin daily. ) 45 mL 3  . Insulin Pen Needle 32G X 4 MM MISC Use 5-6x a day for insulin injections 500 each 3  . levothyroxine (SYNTHROID) 88 MCG tablet TAKE 1 AND 1/2 TABLET BY MOUTH EVERY SUNDAY. TAKE 1 TABLET BY MOUTH MON THROUGH SAT. TAKE ON AN EMPTY STOMACH WITH WATER ONLY. NO FOOD OR OTHER MEDICATIONS FOR 30 MINUTES. 98 tablet 1  . metFORMIN (GLUCOPHAGE-XR) 500 MG 24 hr tablet TAKE 2 TABLETS BY MOUTH EVERY DAY IN THE MORNING 60 tablet 5  . montelukast (SINGULAIR) 10 MG tablet Take 1 tablet (10 mg total) by mouth at bedtime. For allergies  and asthma. 90 tablet 1  . Semaglutide, 1 MG/DOSE, (OZEMPIC, 1 MG/DOSE,) 2 MG/1.5ML SOPN Inject 1 mg into the skin once a week. 6 pen 3  . WIXELA INHUB 500-50 MCG/DOSE AEPB TAKE 1 PUFF BY MOUTH TWICE A DAY (Patient taking differently: Inhale 1 puff into the lungs daily.  ) 60 each 0   No current facility-administered medications on file prior to visit.   Allergies  Allergen Reactions  . 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 She described it was a small amount in a milk carton in [place]. She states they stopped using it shortly after because the trial wasn't successful. SPM   Family History  Problem Relation Age of Onset  . Alzheimer's disease Mother   . Asthma Mother   . Dementia Mother        cause of death  . Arthritis Father   . Heart attack Father 40       cause of death  . Breast cancer Sister 54  . Colon cancer Paternal Grandmother   . Liver cancer Maternal Uncle   . Colon polyps Neg Hx   . Rectal cancer Neg Hx   . Stomach cancer Neg Hx    PE: BP (!) 162/88   Pulse 71   Ht 5\' 5"  (1.651 m)   Wt 197 lb 12.8 oz (89.7 kg)   SpO2 96%   BMI 32.92 kg/m  Wt Readings from Last 3 Encounters:  03/19/20 197 lb 12.8 oz (89.7 kg)  02/21/20 205 lb (93 kg)  11/29/19 205 lb (93 kg)   Constitutional: overweight, in NAD Eyes: PERRLA, EOMI, no exophthalmos ENT: moist mucous membranes, no thyromegaly, no cervical lymphadenopathy Cardiovascular: RRR, No MRG Respiratory: CTA B Gastrointestinal: abdomen soft, NT, ND, BS+ Musculoskeletal: no deformities, strength intact in all 4 Skin: moist, warm, no rashes Neurological: no tremor with outstretched hands, DTR normal in all 4  ASSESSMENT: 1. DM2, insulin-dependent, uncontrolled, without long-term complications, but with hyperglycemia  2. HL  3. Obesity class 1  PLAN:  1. Patient with longstanding, as well, type 2 diabetes, on basal/bolus insulin regimen, also Metformin ER and weekly GLP-1 receptor agonist, with improved control visit after she started on hold based diet.  However, sugars were still uncontrolled, fluctuating although slightly less than before.  Patterns identified at that time: Sugars increasing after  lunch almost every day and they were highest at bedtime.  They were dropping after dinner, some times not much and then increasing gradually mostly yesterday 9 AM high at night but trending down after 2 AM.  Upon questioning, she was correcting low blood sugars after dinner and I advised her to reduce her insulin with dinner and increase her insulin for lunch.  We also discussed about the possible referral to nutrition for carb counting in the near future.  Before last visit, HbA1c was 8.2%, better. CGM interpretation: -At today's visit, we reviewed her CGM downloads: It appears that 36.5% of values are in target range (goal >70%), lower than before, while 63.4% are higher than 180 (goal <25%), and 0.1% are lower than 70 (goal <4%).  The calculated average blood sugar is 221.  The projected HbA1c for the next 3 months ([facility]) is 8.6%. -Reviewing the CGM trends, sugars decrease overnight but they increase significantly after the first meal of the day and they continue to increase with subsequent meals.  Upon questioning, she is afraid to take the  recommended dose of NovoLog with meals due to possible dose.  I reassured her that, with the CGM, she gets alarms when the sugars are dropping so she can definitely try to increase the NovoLog safely.  We also discussed about ways to improve her meals so that she does not drop her sugars later on, including adding healthy fats with meals.  As of now, she is eating oatmeal in the morning but she does add she chia seeds, flaxseeds, nuts.  In that case, I recommended to increase her NovoLog with breakfast.  I also advised her to try to inch up the dose with lunch and dinner. 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 -For now, I advised her to try to switch to a VGo patch pump and referred her to diabetes education to show her how to use it.  Given  brochure about the device.  She agrees to try this.  Supplies sent to her pharmacy. -We also discussed about taking Metformin consistently.  She occasionally forgets to take it with breakfast and I advised her that she can take it all at night, if easier for her. - I suggested to:  Patient Instructions  Please continue: - Metformin ER 1000 mg with breakfast - Ozempic 1 mg weekly - Lantus 20 units in am and 10 units at bedtime  Please increase: - Novolog 15 min before the meals B'fast: 8-10 units  Lunch: 10-11 units Dinner: 8-10 units If sugars before meals <60, do not take Novolog before that meal. If sugars before meals <90, try to move the bolus right before the meal At bedtime, only correct sugars >250, and only with up to 5 units of Novolog.  Please schedule an appt with Leonia Reader for diabetes education for VGo use.  When you switch to VGo, use 4-5 clicks per meal and 2-3 clicks for correction at night.  Please return in 3 months with your sugar log.   - we checked her HbA1c: 8.7% (higher) - advised to check sugars at different times of the day - 3x a day, rotating check times - advised for yearly eye exams >> she is UTD - return to clinic in 3 months  2. HL -Reviewed latest lipid panel from 05/2019: All fractions at goal Lab Results  Component Value Date   CHOL 174 06/06/2019   HDL 74.50 06/06/2019   LDLCALC 83 06/06/2019   TRIG 84.0 06/06/2019   CHOLHDL 2 06/06/2019  -She continues Lipitor 20 without side effects  3.  Obesity class I -Continues Ozempic which should also help with weight loss -She started a plant-based diet before last visit and she lost 7 pounds! -At this visit, she lost 8 lbs  Philemon Kingdom, MD PhD [facility]

## 2020-03-27 ENCOUNTER — Encounter: Payer: Medicare Other | Attending: Internal Medicine | Admitting: Nutrition

## 2020-03-27 ENCOUNTER — Other Ambulatory Visit: Payer: Self-pay

## 2020-03-27 DIAGNOSIS — Z794 Long term (current) use of insulin: Secondary | ICD-10-CM | POA: Diagnosis not present

## 2020-03-27 DIAGNOSIS — E1165 Type 2 diabetes mellitus with hyperglycemia: Secondary | ICD-10-CM | POA: Diagnosis present

## 2020-03-28 ENCOUNTER — Telehealth: Payer: Self-pay | Admitting: Nutrition

## 2020-03-28 NOTE — Patient Instructions (Signed)
1.  Stop all Lantus insulin. 2.  Fill and attach a new V-go each day at the same time. 3.Take 4-5 button presses before meals, and 2-3 button presses at bedtime if blood sugar readings are still high 4.  Call office if blood sugars are low or remain over 200. 5.  Call office on Friday to discuss blood sugar readings with Dr. Cruzita Lederer 6 Call V-Go help line if quesitons 7. Read over all information given

## 2020-03-28 NOTE — Telephone Encounter (Signed)
Torianna, how many clicks did she take before the meal?  She can go ahead and give herself 2 more clicks now.

## 2020-03-28 NOTE — Telephone Encounter (Signed)
No longer any dizziness now.  She gave 5 clicks before breakfast.  Blood sugar now is 266 2hr. PcB.  She was told to take 2 extra clicks now.  She agreed to do this and was very apologetic for having "bothered [place]".

## 2020-03-28 NOTE — Telephone Encounter (Signed)
Patient reported that she started her V-Go 30 today at 9:30AM.  Her blood sugar was 211 fasting. She ate breakfast cereal and milk with nuts) and blood sugar is now 288 (65 min.pc).  She reports feeling light headed, dizzy and shakey.  She is using Novolog insulin in her V-Go.   Please advise.

## 2020-03-28 NOTE — Progress Notes (Addendum)
Patient was trained on how to fill, apply and use the V-Go 30. She was given a V-Go 30 starter kit with 6 V-Gos and directions for use.    She filled 3 V-Gos (Lot# [id number] Exp. 9/22) correctly with Novolog insulin (Lot #: [id number] exp. 8/22),  without any difficulty. She did  not attache a V-Go due to having taken her Lantus this morning and last night. She was told to stop her lantus tonght and apply the V-Go tomorrow morning.  Written instructions were given to give 4-5 button presses before meals and 2-3 at HS if blood sugar is over 200.  She reported good understanding of this with no final questions.   She will use her Dexcom to review blood sugar readings, and call the office on Friday to see if Dr. Cruzita Lederer wants to make any changes to her premeal insulin doses.

## 2020-04-04 ENCOUNTER — Other Ambulatory Visit: Payer: Self-pay

## 2020-04-18 ENCOUNTER — Other Ambulatory Visit: Payer: Self-pay | Admitting: Oncology

## 2020-04-19 ENCOUNTER — Other Ambulatory Visit: Payer: Self-pay | Admitting: Primary Care

## 2020-04-19 DIAGNOSIS — J45909 Unspecified asthma, uncomplicated: Secondary | ICD-10-CM

## 2020-04-20 NOTE — Telephone Encounter (Signed)
Refill request albuterol Last refill 05/31/19  18 G Last office visit 02/21/20

## 2020-04-20 NOTE — Telephone Encounter (Signed)
Requested Prescriptions   Signed Prescriptions Disp Refills  . albuterol (VENTOLIN HFA) 108 (90 Base) MCG/ACT inhaler 8.5 each 0    Sig: INHALE 2 PUFFS BY MOUTH EVERY 6 HOURS AS NEEDED FOR WHEEZE OR SHORTNESS OF BREATH    Authorizing Provider: [name]   Refill(s) sent to pharmacy.
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
## 2020-05-07 ENCOUNTER — Other Ambulatory Visit: Payer: Self-pay

## 2020-05-07 ENCOUNTER — Ambulatory Visit
Admission: RE | Admit: 2020-05-07 | Discharge: 2020-05-07 | Disposition: A | Discharge status: Home / Self Care | Payer: Medicare Other | Source: Ambulatory Visit | Attending: Primary Care | Admitting: Primary Care

## 2020-05-07 DIAGNOSIS — E2839 Other primary ovarian failure: Secondary | ICD-10-CM

## 2020-05-10 ENCOUNTER — Ambulatory Visit (INDEPENDENT_AMBULATORY_CARE_PROVIDER_SITE_OTHER): Payer: Medicare Other | Admitting: Rheumatology

## 2020-05-10 ENCOUNTER — Other Ambulatory Visit: Payer: Self-pay

## 2020-05-10 ENCOUNTER — Encounter: Payer: Self-pay | Admitting: Rheumatology

## 2020-05-10 ENCOUNTER — Telehealth: Payer: Self-pay

## 2020-05-10 VITALS — BP 115/66 | HR 81 | Resp 17 | Ht 64.0 in | Wt 201.6 lb

## 2020-05-10 DIAGNOSIS — K1120 Sialoadenitis, unspecified: Secondary | ICD-10-CM

## 2020-05-10 DIAGNOSIS — M47816 Spondylosis without myelopathy or radiculopathy, lumbar region: Secondary | ICD-10-CM

## 2020-05-10 DIAGNOSIS — Z794 Long term (current) use of insulin: Secondary | ICD-10-CM

## 2020-05-10 DIAGNOSIS — M0609 Rheumatoid arthritis without rheumatoid factor, multiple sites: Secondary | ICD-10-CM

## 2020-05-10 DIAGNOSIS — E1165 Type 2 diabetes mellitus with hyperglycemia: Secondary | ICD-10-CM

## 2020-05-10 DIAGNOSIS — M25542 Pain in joints of left hand: Secondary | ICD-10-CM

## 2020-05-10 DIAGNOSIS — M25541 Pain in joints of right hand: Secondary | ICD-10-CM | POA: Diagnosis not present

## 2020-05-10 DIAGNOSIS — M65312 Trigger thumb, left thumb: Secondary | ICD-10-CM | POA: Diagnosis not present

## 2020-05-10 DIAGNOSIS — M65311 Trigger thumb, right thumb: Secondary | ICD-10-CM

## 2020-05-10 DIAGNOSIS — E559 Vitamin D deficiency, unspecified: Secondary | ICD-10-CM

## 2020-05-10 DIAGNOSIS — M4310 Spondylolisthesis, site unspecified: Secondary | ICD-10-CM

## 2020-05-10 DIAGNOSIS — Z803 Family history of malignant neoplasm of breast: Secondary | ICD-10-CM

## 2020-05-10 DIAGNOSIS — Z800 Family history of malignant neoplasm of digestive organs: Secondary | ICD-10-CM

## 2020-05-10 DIAGNOSIS — G894 Chronic pain syndrome: Secondary | ICD-10-CM

## 2020-05-10 DIAGNOSIS — N6091 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: Secondary | ICD-10-CM

## 2020-05-10 DIAGNOSIS — Z8639 Personal history of other endocrine, nutritional and metabolic disease: Secondary | ICD-10-CM

## 2020-05-10 DIAGNOSIS — R768 Other specified abnormal immunological findings in serum: Secondary | ICD-10-CM | POA: Diagnosis not present

## 2020-05-10 DIAGNOSIS — J4521 Mild intermittent asthma with (acute) exacerbation: Secondary | ICD-10-CM

## 2020-05-10 DIAGNOSIS — Q667 Congenital pes cavus, unspecified foot: Secondary | ICD-10-CM

## 2020-05-10 NOTE — Telephone Encounter (Signed)
Inbound fax from [facility] requesting clinical notes from the past 6 months be faxed to [phone]. Clinical notes routed via Epic

## 2020-05-10 NOTE — Addendum Note (Signed)
Addended by: Earnestine Mealing on: 05/10/2020 02:01 PM   Modules accepted: Orders

## 2020-05-10 NOTE — Patient Instructions (Signed)
Hand Exercises Hand exercises can be helpful for almost anyone. These exercises can strengthen the hands, improve flexibility and movement, and increase blood flow to the hands. These results can make work and daily tasks easier. Hand exercises can be especially helpful for people who have joint pain from arthritis or have nerve damage from overuse (carpal tunnel syndrome). These exercises can also help people who have injured a hand. Exercises Most of these hand exercises are gentle stretching and motion exercises. It is usually safe to do them often throughout the day. Warming up your hands before exercise may help to reduce stiffness. You can do this with gentle massage or by placing your hands in warm water for 10-15 minutes. It is normal to feel some stretching, pulling, tightness, or mild discomfort as you begin new exercises. This will gradually improve. Stop an exercise right away if you feel sudden, severe pain or your pain gets worse. Ask your health care provider which exercises are best for you. Knuckle bend or "claw" fist 1. Stand or sit with your arm, hand, and all five fingers pointed straight up. Make sure to keep your wrist straight during the exercise. 2. Gently bend your fingers down toward your palm until the tips of your fingers are touching the top of your palm. Keep your big knuckle straight and just bend the small knuckles in your fingers. 3. Hold this position for __________ seconds. 4. Straighten (extend) your fingers back to the starting position. Repeat this exercise 5-10 times with each hand. Full finger fist 1. Stand or sit with your arm, hand, and all five fingers pointed straight up. Make sure to keep your wrist straight during the exercise. 2. Gently bend your fingers into your palm until the tips of your fingers are touching the middle of your palm. 3. Hold this position for __________ seconds. 4. Extend your fingers back to the starting position, stretching every  joint fully. Repeat this exercise 5-10 times with each hand. Straight fist 1. Stand or sit with your arm, hand, and all five fingers pointed straight up. Make sure to keep your wrist straight during the exercise. 2. Gently bend your fingers at the big knuckle, where your fingers meet your hand, and the middle knuckle. Keep the knuckle at the tips of your fingers straight and try to touch the bottom of your palm. 3. Hold this position for __________ seconds. 4. Extend your fingers back to the starting position, stretching every joint fully. Repeat this exercise 5-10 times with each hand. Tabletop 1. Stand or sit with your arm, hand, and all five fingers pointed straight up. Make sure to keep your wrist straight during the exercise. 2. Gently bend your fingers at the big knuckle, where your fingers meet your hand, as far down as you can while keeping the small knuckles in your fingers straight. Think of forming a tabletop with your fingers. 3. Hold this position for __________ seconds. 4. Extend your fingers back to the starting position, stretching every joint fully. Repeat this exercise 5-10 times with each hand. Finger spread 1. Place your hand flat on a table with your palm facing down. Make sure your wrist stays straight as you do this exercise. 2. Spread your fingers and thumb apart from each other as far as you can until you feel a gentle stretch. Hold this position for __________ seconds. 3. Bring your fingers and thumb tight together again. Hold this position for __________ seconds. Repeat this exercise 5-10 times with each hand.   Making circles 1. Stand or sit with your arm, hand, and all five fingers pointed straight up. Make sure to keep your wrist straight during the exercise. 2. Make a circle by touching the tip of your thumb to the tip of your index finger. 3. Hold for __________ seconds. Then open your hand wide. 4. Repeat this motion with your thumb and each finger on your  hand. Repeat this exercise 5-10 times with each hand. Thumb motion 1. Sit with your forearm resting on a table and your wrist straight. Your thumb should be facing up toward the ceiling. Keep your fingers relaxed as you move your thumb. 2. Lift your thumb up as high as you can toward the ceiling. Hold for __________ seconds. 3. Bend your thumb across your palm as far as you can, reaching the tip of your thumb for the small finger (pinkie) side of your palm. Hold for __________ seconds. Repeat this exercise 5-10 times with each hand. Grip strengthening 1. Hold a stress ball or other soft ball in the middle of your hand. 2. Slowly increase the pressure, squeezing the ball as much as you can without causing pain. Think of bringing the tips of your fingers into the middle of your palm. All of your finger joints should bend when doing this exercise. 3. Hold your squeeze for __________ seconds, then relax. Repeat this exercise 5-10 times with each hand.   Contact a health care provider if:  Your hand pain or discomfort gets much worse when you do an exercise.  Your hand pain or discomfort does not improve within 2 hours after you exercise. If you have any of these problems, stop doing these exercises right away. Do not do them again unless your health care provider says that you can. Get help right away if:  You develop sudden, severe hand pain or swelling. If this happens, stop doing these exercises right away. Do not do them again unless your health care provider says that you can. This information is not intended to replace advice given to you by your health care provider. Make sure you discuss any questions you have with your health care provider. Document Revised: 06/24/2018 Document Reviewed: 03/04/2018 Elsevier Patient Education  2021 [employer]vier Inc.  

## 2020-05-14 ENCOUNTER — Other Ambulatory Visit: Payer: Self-pay | Admitting: Primary Care

## 2020-05-14 DIAGNOSIS — J4520 Mild intermittent asthma, uncomplicated: Secondary | ICD-10-CM

## 2020-05-14 NOTE — Telephone Encounter (Signed)
Ok to fill 

## 2020-05-15 ENCOUNTER — Telehealth: Payer: Self-pay | Admitting: Podiatry

## 2020-05-15 NOTE — Telephone Encounter (Signed)
Pt left message stating she is starting to get a hammertoe and was seen by another office and they recommended her to come to [place] to adjust the orthotic for the hammertoe.  I returned call and left message for pt to call to discuss further and I possibly do not have an open appt until next month in [place] office.

## 2020-05-15 NOTE — Telephone Encounter (Signed)
Refills sent to pharmacy. 

## 2020-05-16 NOTE — Telephone Encounter (Signed)
Pt returned call and left message.  I returned call and left message for pt to call to discuss appt possibly in [place] or [place].Marland Kitchen
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
## 2020-05-31 ENCOUNTER — Other Ambulatory Visit: Payer: Self-pay

## 2020-05-31 ENCOUNTER — Encounter: Payer: Self-pay | Admitting: Rheumatology

## 2020-05-31 ENCOUNTER — Ambulatory Visit (INDEPENDENT_AMBULATORY_CARE_PROVIDER_SITE_OTHER): Payer: Medicare Other | Admitting: Rheumatology

## 2020-05-31 VITALS — BP 125/78 | HR 74 | Resp 15 | Ht 64.0 in | Wt 201.0 lb

## 2020-05-31 DIAGNOSIS — M65311 Trigger thumb, right thumb: Secondary | ICD-10-CM | POA: Diagnosis not present

## 2020-05-31 DIAGNOSIS — E1165 Type 2 diabetes mellitus with hyperglycemia: Secondary | ICD-10-CM

## 2020-05-31 DIAGNOSIS — E559 Vitamin D deficiency, unspecified: Secondary | ICD-10-CM

## 2020-05-31 DIAGNOSIS — M65312 Trigger thumb, left thumb: Secondary | ICD-10-CM

## 2020-05-31 DIAGNOSIS — Z803 Family history of malignant neoplasm of breast: Secondary | ICD-10-CM

## 2020-05-31 DIAGNOSIS — R768 Other specified abnormal immunological findings in serum: Secondary | ICD-10-CM

## 2020-05-31 DIAGNOSIS — Z794 Long term (current) use of insulin: Secondary | ICD-10-CM

## 2020-05-31 DIAGNOSIS — Z8639 Personal history of other endocrine, nutritional and metabolic disease: Secondary | ICD-10-CM

## 2020-05-31 DIAGNOSIS — N6091 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: Secondary | ICD-10-CM

## 2020-05-31 DIAGNOSIS — J4521 Mild intermittent asthma with (acute) exacerbation: Secondary | ICD-10-CM

## 2020-05-31 DIAGNOSIS — M47816 Spondylosis without myelopathy or radiculopathy, lumbar region: Secondary | ICD-10-CM

## 2020-05-31 DIAGNOSIS — M19041 Primary osteoarthritis, right hand: Secondary | ICD-10-CM

## 2020-05-31 DIAGNOSIS — Q667 Congenital pes cavus, unspecified foot: Secondary | ICD-10-CM

## 2020-05-31 DIAGNOSIS — G894 Chronic pain syndrome: Secondary | ICD-10-CM

## 2020-05-31 DIAGNOSIS — M4310 Spondylolisthesis, site unspecified: Secondary | ICD-10-CM

## 2020-05-31 DIAGNOSIS — Z800 Family history of malignant neoplasm of digestive organs: Secondary | ICD-10-CM

## 2020-05-31 DIAGNOSIS — M19042 Primary osteoarthritis, left hand: Secondary | ICD-10-CM

## 2020-05-31 DIAGNOSIS — R7681 Abnormal rheumatoid factor and anti-citrullinated protein antibody without rheumatoid arthritis: Secondary | ICD-10-CM

## 2020-05-31 NOTE — Patient Instructions (Signed)
Hand Exercises Hand exercises can be helpful for almost anyone. These exercises can strengthen the hands, improve flexibility and movement, and increase blood flow to the hands. These results can make work and daily tasks easier. Hand exercises can be especially helpful for people who have joint pain from arthritis or have nerve damage from overuse (carpal tunnel syndrome). These exercises can also help people who have injured a hand. Exercises Most of these hand exercises are gentle stretching and motion exercises. It is usually safe to do them often throughout the day. Warming up your hands before exercise may help to reduce stiffness. You can do this with gentle massage or by placing your hands in warm water for 10-15 minutes. It is normal to feel some stretching, pulling, tightness, or mild discomfort as you begin new exercises. This will gradually improve. Stop an exercise right away if you feel sudden, severe pain or your pain gets worse. Ask your health care provider which exercises are best for you. Knuckle bend or "claw" fist 1. Stand or sit with your arm, hand, and all five fingers pointed straight up. Make sure to keep your wrist straight during the exercise. 2. Gently bend your fingers down toward your palm until the tips of your fingers are touching the top of your palm. Keep your big knuckle straight and just bend the small knuckles in your fingers. 3. Hold this position for __________ seconds. 4. Straighten (extend) your fingers back to the starting position. Repeat this exercise 5-10 times with each hand. Full finger fist 1. Stand or sit with your arm, hand, and all five fingers pointed straight up. Make sure to keep your wrist straight during the exercise. 2. Gently bend your fingers into your palm until the tips of your fingers are touching the middle of your palm. 3. Hold this position for __________ seconds. 4. Extend your fingers back to the starting position, stretching every  joint fully. Repeat this exercise 5-10 times with each hand. Straight fist 1. Stand or sit with your arm, hand, and all five fingers pointed straight up. Make sure to keep your wrist straight during the exercise. 2. Gently bend your fingers at the big knuckle, where your fingers meet your hand, and the middle knuckle. Keep the knuckle at the tips of your fingers straight and try to touch the bottom of your palm. 3. Hold this position for __________ seconds. 4. Extend your fingers back to the starting position, stretching every joint fully. Repeat this exercise 5-10 times with each hand. Tabletop 1. Stand or sit with your arm, hand, and all five fingers pointed straight up. Make sure to keep your wrist straight during the exercise. 2. Gently bend your fingers at the big knuckle, where your fingers meet your hand, as far down as you can while keeping the small knuckles in your fingers straight. Think of forming a tabletop with your fingers. 3. Hold this position for __________ seconds. 4. Extend your fingers back to the starting position, stretching every joint fully. Repeat this exercise 5-10 times with each hand. Finger spread 1. Place your hand flat on a table with your palm facing down. Make sure your wrist stays straight as you do this exercise. 2. Spread your fingers and thumb apart from each other as far as you can until you feel a gentle stretch. Hold this position for __________ seconds. 3. Bring your fingers and thumb tight together again. Hold this position for __________ seconds. Repeat this exercise 5-10 times with each hand.   Making circles 1. Stand or sit with your arm, hand, and all five fingers pointed straight up. Make sure to keep your wrist straight during the exercise. 2. Make a circle by touching the tip of your thumb to the tip of your index finger. 3. Hold for __________ seconds. Then open your hand wide. 4. Repeat this motion with your thumb and each finger on your  hand. Repeat this exercise 5-10 times with each hand. Thumb motion 1. Sit with your forearm resting on a table and your wrist straight. Your thumb should be facing up toward the ceiling. Keep your fingers relaxed as you move your thumb. 2. Lift your thumb up as high as you can toward the ceiling. Hold for __________ seconds. 3. Bend your thumb across your palm as far as you can, reaching the tip of your thumb for the small finger (pinkie) side of your palm. Hold for __________ seconds. Repeat this exercise 5-10 times with each hand. Grip strengthening 1. Hold a stress ball or other soft ball in the middle of your hand. 2. Slowly increase the pressure, squeezing the ball as much as you can without causing pain. Think of bringing the tips of your fingers into the middle of your palm. All of your finger joints should bend when doing this exercise. 3. Hold your squeeze for __________ seconds, then relax. Repeat this exercise 5-10 times with each hand.   Contact a health care provider if:  Your hand pain or discomfort gets much worse when you do an exercise.  Your hand pain or discomfort does not improve within 2 hours after you exercise. If you have any of these problems, stop doing these exercises right away. Do not do them again unless your health care provider says that you can. Get help right away if:  You develop sudden, severe hand pain or swelling. If this happens, stop doing these exercises right away. Do not do them again unless your health care provider says that you can. This information is not intended to replace advice given to you by your health care provider. Make sure you discuss any questions you have with your health care provider. Document Revised: 06/24/2018 Document Reviewed: 03/04/2018 Elsevier Patient Education  2021 [employer]vier Inc.  

## 2020-06-08 ENCOUNTER — Ambulatory Visit (INDEPENDENT_AMBULATORY_CARE_PROVIDER_SITE_OTHER): Payer: Medicare Other | Admitting: Podiatry

## 2020-06-08 ENCOUNTER — Ambulatory Visit (INDEPENDENT_AMBULATORY_CARE_PROVIDER_SITE_OTHER): Payer: Medicare Other

## 2020-06-08 ENCOUNTER — Encounter: Payer: Self-pay | Admitting: Podiatry

## 2020-06-08 ENCOUNTER — Other Ambulatory Visit: Payer: Self-pay

## 2020-06-08 DIAGNOSIS — M722 Plantar fascial fibromatosis: Secondary | ICD-10-CM | POA: Diagnosis not present

## 2020-06-08 DIAGNOSIS — M2042 Other hammer toe(s) (acquired), left foot: Secondary | ICD-10-CM | POA: Diagnosis not present

## 2020-06-08 DIAGNOSIS — M2040 Other hammer toe(s) (acquired), unspecified foot: Secondary | ICD-10-CM

## 2020-06-08 DIAGNOSIS — M2041 Other hammer toe(s) (acquired), right foot: Secondary | ICD-10-CM | POA: Diagnosis not present

## 2020-06-08 NOTE — Progress Notes (Signed)
   HPI: 75 y.o. female presenting today as a referral from her rheumatologist for evaluation of possible hammertoes to bilateral feet.  She does have a history of plantar fasciitis to the right heel which is currently asymptomatic with her orthotics.  She wears orthotics daily and she says they helped significantly.  Past Medical History:  Diagnosis Date  . Allergic rhinitis   . Allergy   . Asthma   . Atypical ductal hyperplasia of right breast   . 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 Breast cancer ([place])   . Diabetes mellitus without complication ([place])   . Family history of breast cancer   . Family history of colon cancer   . Hypercholesteremia   . Hypothyroidism   . Lumbar herniated disc   . OSA (obstructive sleep apnea)    used to use CPAP, not any more over 1 1/2 yrs  . Salivary stone    left side  . Sleep apnea    no cpap in 2 yrs   . Vitamin D deficiency      Physical Exam: General: The patient is alert and oriented x3 in no acute distress.  Dermatology: Skin is warm, dry and supple bilateral lower extremities. Negative for open lesions or macerations.  Vascular: Palpable pedal pulses bilaterally. No edema or erythema noted. Capillary refill within normal limits.  Neurological: Epicritic and protective threshold grossly intact bilaterally.   Musculoskeletal Exam: Range of motion within normal limits to all pedal and ankle joints bilateral. Muscle strength 5/5 in all groups bilateral.  Very mild reducible hammertoes noted 2-5 bilateral.  Negative for any significant pain on palpation  Radiographic Exam:  Normal osseous mineralization. Joint spaces preserved. No fracture/dislocation/boney destruction.    Assessment: 1.  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  H/0 plantar fasciitis right   Plan of Care:  1. Patient evaluated. X-Rays reviewed.  2.  Continue custom molded orthotics 3.  Continue wearing good supportive shoes and  sneakers 4.  Upon evaluation today I do not believe the patient, at least in the foreseeable future, should need any surgical intervention for the hammertoe deformities.  They are completely asymptomatic and reducible during weightbearing. 5.  Return to clinic as needed  *Brother does archaeological towards in [place]     Edrick Kins, [facility]  Dr. Edrick Kins, DPM    [address],  [address]                Office [phone]  Fax [phone]

## 2020-06-09 ENCOUNTER — Other Ambulatory Visit: Payer: Self-pay | Admitting: Primary Care

## 2020-06-09 DIAGNOSIS — J45909 Unspecified asthma, uncomplicated: Secondary | ICD-10-CM

## 2020-06-10 NOTE — Telephone Encounter (Signed)
Shouldn't need a refill of her albuterol this soon. What's going on?

## 2020-06-12 NOTE — Telephone Encounter (Signed)
Called patient she has a trip out of the county and she wanted to have extra for trip incase of issue with travel and one gets lost or broken. She is going to [place] and it will be warm and a lot of walking and those are her triggers.

## 2020-06-13 NOTE — Telephone Encounter (Signed)
Refills sent to pharmacy. 

## 2020-06-19 ENCOUNTER — Telehealth: Payer: Self-pay | Admitting: Internal Medicine

## 2020-06-19 NOTE — Telephone Encounter (Signed)
Please advised  

## 2020-06-19 NOTE — Telephone Encounter (Signed)
Patent requests to be called at ph# [phone] re: Extremely High Blood Sugars-in the 500's (Dexcom quits reading at over 400). Patient states she has called twice today with no call back and states she has been waiting next to the phone since 8:30 am this morning. and will continue to wait for call back. Patient states she is feeling dizzy and wobbly.

## 2020-06-19 NOTE — Telephone Encounter (Signed)
Called and d/w pt. Advised her how to correct her sugars going further. She already changed her VGo.

## 2020-06-19 NOTE — Telephone Encounter (Signed)
Dr Cruzita Lederer called and spoke with pt personally.

## 2020-06-19 NOTE — Telephone Encounter (Signed)
Pt called the office to let us know her Blood Sugars are currently 554. She states she hasn't had anything to eat yet and and took 4 units of her breakfast insulin (she usually takes 3). Pt reports no symptoms and states she feels fine right now.  Her BS have been ranging from 40-50s about 3x a day and last night they were 41 so she had some vegan chili with some scoops tortilla chips (to hopefully bring the BS up). Pt stayed up to watch her BS because they kept rising and at around midnight they were in the 300s. 3 hrs later at 3:00am her meter went off to let her know her BS were high and so she finger pricked and the readings said she was at 440. Pt states she stays eating vegan, drinking water, and exercising. Since pt still hasn't eaten she would like to know if she could eat her keto cereal since it doesn't seem to do anything to her BS? Pt also has a big event today with her children and wants to know if it would be safe for her to drive and go?  Callback ph# [phone]

## 2020-06-21 ENCOUNTER — Encounter: Payer: Self-pay | Admitting: Internal Medicine

## 2020-06-21 ENCOUNTER — Other Ambulatory Visit: Payer: Self-pay

## 2020-06-21 ENCOUNTER — Ambulatory Visit (INDEPENDENT_AMBULATORY_CARE_PROVIDER_SITE_OTHER): Payer: Medicare Other | Admitting: Internal Medicine

## 2020-06-21 VITALS — BP 132/74 | HR 94 | Ht 64.0 in | Wt 202.0 lb

## 2020-06-21 DIAGNOSIS — Z794 Long term (current) use of insulin: Secondary | ICD-10-CM

## 2020-06-21 DIAGNOSIS — E1165 Type 2 diabetes mellitus with hyperglycemia: Secondary | ICD-10-CM

## 2020-06-21 DIAGNOSIS — E669 Obesity, unspecified: Secondary | ICD-10-CM

## 2020-06-21 DIAGNOSIS — E785 Hyperlipidemia, unspecified: Secondary | ICD-10-CM

## 2020-06-21 LAB — POCT GLYCOSYLATED HEMOGLOBIN (HGB A1C): Hemoglobin A1C: 8.1 % — AB (ref 4.0–5.6)

## 2020-06-21 MED ORDER — DEXCOM G6 TRANSMITTER MISC: 1.0000 | 3 refills | Status: DC

## 2020-06-21 NOTE — Patient Instructions (Addendum)
Please continue: - Metformin ER 1000 mg with breakfast - Ozempic 1 mg weekly  Increase: - VGo: 4-5 clicks per meal (and 1 click for correction at night - if absolutely needed).  If sugars before meals <60, do not take Novolog before that meal. If sugars before meals <90, try to move the bolus right before the meal At bedtime, only correct sugars >959, and only 1 click of Novolog.  Please return in 1 month.

## 2020-06-21 NOTE — Progress Notes (Signed)
Patient ID: Molly Wall, female   DOB: 02/17/1946, 75 y.o.   MRN: [id number]   This visit occurred during the SARS-CoV-2 public health emergency.  Safety protocols were in place, including screening questions prior to the visit, additional usage of staff PPE, and extensive cleaning of exam room while observing appropriate contact time as indicated for disinfecting solutions.   HPI: Molly Wall is a 75 y.o.-year-old female, initially referred by her PCP, Alma Friendly, MD, returning for follow-up for DM2, dx at 20 (2017), h/o reactive hypoglycemia, insulin-dependent, uncontrolled, without long-term complications.  Last visit 4 months ago.  Interim history: She continues on a plant-based diet.  After she starting this, she lost weight and sugars improved. She called several days ago with low blood sugars in the 40s and 50s, which then she try to correct by eating a higher carb meal.  Sugars increased to 400s and were very difficult to improve afterwards despite repeated boluses.  Reviewed HbA1c levels: Lab Results  Component Value Date   HGBA1C 8.7 (A) 03/19/2020   HGBA1C 8.2 (A) 09/07/2019   HGBA1C 9.4 (H) 06/06/2019   HGBA1C 9.1 (A) 02/08/2019   HGBA1C 9.6 (A) 10/27/2018   HGBA1C 8.2 (A) 07/30/2018   HGBA1C 11.1 (A) 04/27/2018   HGBA1C 11.3 (H) 12/09/2017   HGBA1C 6.8 (H) 04/10/2016   HGBA1C 6.6 (H) 01/09/2016   She is on: - Metformin ER 1000 mg with breakfast - Ozempic 0.5 >> 1 mg weekly - VGo 30 4-5 >> 3-4 clicks per meal and 2-3 clicks >> 1 clicks for correction at night.  Previously on: - Lantus 35 >> 25 units in am >> 25 units in a.m. and 10 units at bedtime >> 20 units in a.m. and 10 units at night - Novolog 7-9 units before 15 min before meals >> B'fast: 6-8 units  Lunch: 10-11 units Dinner: 6-7 units If sugars before meals <60, do not take Novolog before that meal. If sugars before meals <90, take 50% of the Novolog before that meal At bedtime, only correct  sugars >250, and only with up to 5 units of Novolog.  She checks her sugars more than 4 times a day with her CGM.   Previously:   Lowest sugar was 55 >> 84 >> 47 >> 42 x1 >> 40s; she has hypoglycemia awareness in the 80s. Highest sugar was 578  >>  436 >> 591 >> 400s >> 500s.  Glucometer: One Touch Verio  -No CKD, last BUN/creatinine:  Lab Results  Component Value Date   BUN 21 01/06/2019   BUN 17 12/30/2018   CREATININE 0.82 01/06/2019   CREATININE 0.71 12/30/2018  Not on ACE inhibitor/ARB.  -No HL; last set of lipids: Lab Results  Component Value Date   CHOL 174 06/06/2019   HDL 74.50 06/06/2019   LDLCALC 83 06/06/2019   TRIG 84.0 06/06/2019   CHOLHDL 2 06/06/2019  On Lipitor 20.  - last eye exam was in 06/2019: No DR  -No numbness and tingling in her feet.  Pt has FH of DM in [place].  She has OSA and started a CPAP after which she felt much better.  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    She is on levothyroxine 88 mcg 1 tablet 6/7 days and 1.5 tablets 1/7 days.  Latest TSH was normal: Lab Results  Component Value Date   TSH 0.76 02/21/2020   She lives alone.  ROS: Constitutional: no weight gain/no weight loss, no fatigue, no subjective hyperthermia, no subjective hypothermia Eyes: no blurry vision, no xerophthalmia ENT: no sore throat, no nodules palpated in neck, no dysphagia, no odynophagia, no hoarseness Cardiovascular: no CP/no SOB/no palpitations/no leg swelling Respiratory: no cough/no SOB/no wheezing Gastrointestinal: no N/no V/no D/no C/no acid reflux Musculoskeletal: no muscle aches/no joint aches Skin: no rashes, no hair loss Neurological: no tremors/no numbness/no tingling/no dizziness  I reviewed pt's medications, allergies, PMH, social hx, family hx, and changes  were documented in the history of present illness. Otherwise, unchanged from my initial visit note.  Past Medical History:  Diagnosis Date  . Allergic rhinitis   . Allergy   . Asthma   . Atypical ductal hyperplasia of right breast   . 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 Breast cancer ([place])   . Diabetes mellitus without complication ([place])   . Family history of breast cancer   . Family history of colon cancer   . Hypercholesteremia   . Hypothyroidism   . Lumbar herniated disc   . OSA (obstructive sleep apnea)    used to use CPAP, not any more over 1 1/2 yrs  . Salivary stone    left side  . Sleep apnea    no cpap in 2 yrs   . Vitamin D deficiency    Past Surgical History:  Procedure Laterality Date  . APPENDECTOMY    . BREAST LUMPECTOMY WITH RADIOACTIVE SEED LOCALIZATION Right 08/12/2017   Procedure: RIGHT BREAST LUMPECTOMY X'S 2 WITH RADIOACTIVE SEED LOCALIZATION X'S 2;  Surgeon: Fanny Skates, MD;  Location: [facility];  Service: General;  Laterality: Right;  . COLONOSCOPY     11 yrs ago in [place] but given a 10 yr recall per pt   . GANGLION CYST EXCISION     75 years old  . INCISION AND DRAINAGE ABSCESS ANAL    . KNEE SURGERY  2014  . [place]  . TONSILLECTOMY     age 75   Social History   Socioeconomic History  . Marital status: Divorced    Spouse name: Not on file  . Number of children: 2  . Years of education: Not on file  . Highest education level: Not on file  Occupational History  . Not on file  Tobacco Use  . Smoking status: Former Smoker    Packs/day: 1.00    Years: 2.00    Pack years: 2.00    Types: Cigarettes  . Smokeless tobacco: Never Used  . Tobacco comment: quite smoker at age 75  Substance and Sexual Activity  . Alcohol use: Yes    Alcohol/week: 0.0 - 1.0 standard drinks    Comment: 0-1 per month  . Drug use: No  . Sexual activity: Not Currently  Other Topics Concern  . Not  on file  Social History Narrative   Single.   Moved from [place] to [place].   Family lives in [place].   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   Social Determinants of Health   Financial Resource Strain:   . Difficulty of Paying Living Expenses: Not on file  Food Insecurity:   . Worried About Charity fundraiser in the Last Year: Not on  file  . [place] in the Last Year: Not on file  Transportation Needs:   . Lack of Transportation (Medical): Not on file  . Lack of Transportation (Non-Medical): Not on file  Physical Activity:   . Days of Exercise per Week: Not on file  . Minutes of Exercise per Session: Not on file  Stress:   . Feeling of Stress : Not on file  Social Connections:   . Frequency of Communication with Friends and Family: Not on file  . Frequency of Social Gatherings with Friends and Family: Not on file  . Attends Religious Services: Not on file  . Active Member of Clubs or Organizations: Not on file  . Attends Archivist Meetings: Not on file  . Marital Status: Not on file  Intimate Partner Violence:   . Fear of Current or Ex-Partner: Not on file  . Emotionally Abused: Not on file  . Physically Abused: Not on file  . Sexually Abused: Not on file   Current Outpatient Medications on File Prior to Visit  Medication Sig Dispense Refill  . albuterol (VENTOLIN HFA) 108 (90 Base) MCG/ACT inhaler INHALE 2 PUFFS BY MOUTH EVERY 6 HOURS AS NEEDED FOR WHEEZE OR SHORTNESS OF BREATH 8 g 0  . anastrozole (ARIMIDEX) 1 MG tablet TAKE 1 TABLET BY MOUTH EVERY DAY 90 tablet 4  . atorvastatin (LIPITOR) 20 MG tablet TAKE 1 TABLET BY MOUTH EVERY DAY FOR CHOLESTEROL 90 tablet 1  . fluticasone (FLONASE) 50 MCG/ACT nasal spray INSTILL 1 SPRAY IN EACH NOSTRIL TWICE A DAY AS NEEDED FOR ALLERGIES OR RHINITIS 48 mL 0  . insulin aspart (NOVOLOG) 100 UNIT/ML injection Use 66 units of insulin  in the VGo pump daily. 30 mL PRN  . Insulin Disposable Pump (V-GO 30) KIT Use 1 kit a day 90 kit 3  . Insulin Pen Needle 32G X 4 MM MISC Use 5-6x a day for insulin injections 500 each 3  . levothyroxine (SYNTHROID) 88 MCG tablet TAKE 1 AND 1/2 TABLET BY MOUTH EVERY SUNDAY. TAKE 1 TABLET BY MOUTH MON THROUGH SAT. TAKE ON AN EMPTY STOMACH WITH WATER ONLY. NO FOOD OR OTHER MEDICATIONS FOR 30 MINUTES. 98 tablet 1  . metFORMIN (GLUCOPHAGE-XR) 500 MG 24 hr tablet TAKE 2 TABLETS BY MOUTH EVERY DAY IN THE MORNING 60 tablet 5  . montelukast (SINGULAIR) 10 MG tablet Take 1 tablet (10 mg total) by mouth at bedtime. For allergies and asthma. 90 tablet 1  . Semaglutide, 1 MG/DOSE, (OZEMPIC, 1 MG/DOSE,) 2 MG/1.5ML SOPN Inject 1 mg into the skin once a week. 6 pen 3  . WIXELA INHUB 250-50 MCG/DOSE AEPB TAKE 1 PUFF BY MOUTH TWICE A DAY 60 each 3  . WIXELA INHUB 500-50 MCG/DOSE AEPB TAKE 1 PUFF BY MOUTH TWICE A DAY (Patient taking differently: Inhale 1 puff into the lungs daily.) 60 each 0   No current facility-administered medications on file prior to visit.   Allergies  Allergen Reactions  . 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 She described it was a small amount in a milk carton in [place]. She states they stopped using it shortly after because the trial wasn't successful. SPM   Family History  Problem Relation Age of Onset  . Alzheimer's disease Mother   . Asthma Mother   . Dementia Mother        cause of death  . Arthritis Father   .  Heart attack Father 60       cause of death  . Breast cancer Sister 54  . Healthy Daughter   . Colon cancer Paternal Grandmother   . Liver cancer Maternal Uncle   . Healthy Daughter   . Healthy Brother   . Healthy Sister   . Colon polyps Neg Hx   . Rectal cancer Neg Hx   . Stomach cancer Neg Hx    PE: BP 132/74   Pulse 94   Ht 5' 4" (1.626 m)   Wt 202 lb (91.6 kg)   SpO2 97%    BMI 34.67 kg/m  Wt Readings from Last 3 Encounters:  06/21/20 202 lb (91.6 kg)  05/31/20 201 lb (91.2 kg)  05/10/20 201 lb 9.6 oz (91.4 kg)   Constitutional: overweight, in NAD Eyes: PERRLA, EOMI, no exophthalmos ENT: moist mucous membranes, no thyromegaly, no cervical lymphadenopathy Cardiovascular: RRR, No MRG Respiratory: CTA B Gastrointestinal: abdomen soft, NT, ND, BS+ Musculoskeletal: no deformities, strength intact in all 4 Skin: moist, warm, no rashes Neurological: no tremor with outstretched hands, DTR normal in all 4  ASSESSMENT: 1. DM2, insulin-dependent, uncontrolled, without long-term complications, but with hyperglycemia  2. HL  3. Obesity class 1  PLAN:  1. Patient with longstanding insulin-dependent diabetes, on basal/bolus insulin regimen, also Metformin ER and weekly GLP-1 receptor agonist, with improved control after she started on a plant-based diet.  However, sugars remain very fluctuating per review of her CGM downloads.  At last visit, sugars were decreasing overnight but they were increasing significantly after the first meal of the day and they continued to increase with subsequent meals.  At that time she was afraid to take the recommended dose of NovoLog with meals due to possible lows.  I did suggest to switch to VGo patch pump, which she did since last visit.  We also discussed at that time about improving her diet and adding healthy fats with meals. She most likely has insulin deficiency based on her blood sugar fluctuations.  HbA1c was 8.7%, higher at last check. -Since last visit, she developed low blood sugars, 40s to 50s on her plant-based diet and few days ago she ate chili with tortilla chips and sugars increased to 300s and 400s and she was unable to bring them down.  We discussed on the phone and advised her to use repeated boluses to bring the blood sugars down. CGM interpretation: -At today's visit, we reviewed her CGM downloads: It appears that  45.3% of values are in target range (goal >70%), while 52.9% are higher than 180 (goal <25%), and 1.8% are lower than 70 (goal <4%).  The calculated average blood sugar is 198.  The projected HbA1c for the next 3 months ([facility]MI) is 8.1%. -Reviewing the CGM trends, sugars appear to be only slightly better than before, with more values in target range compared to last visit.  However, she still has the same pattern of decreased blood sugars during the night with a nadir around 5 AM after which sugars start to increase.  Upon questioning, she is still not bolusing the recommended dose of NovoLog before meals as she is afraid that she may drop her blood sugars.  However, upon questioning, she is not injecting the insulin at the right time, she sometimes injects the full dose after meals, after forgetting to take it before the meal.  Also, she does correct blood sugars at bedtime which I feel may precipitate her drops in blood sugars during the  night.  At this visit, I again explained why she needs to bolus before every meal and to increase the number of boluses if the sugars are not controlled after the meal.  I hope by doing this she will not need to correct blood sugars at night and therefore prevent low blood sugars in the early morning hours.  If the sugars improved after this measures, will need to switch to VGo20 pump.  If the sugars do not change much at next visit, I would suggest to switch to a regular insulin pump. -I sent a transmitter prescription to her pharmacy per her request today - I suggested to:  Patient Instructions  Please continue: - Metformin ER 1000 mg with breakfast - Ozempic 1 mg weekly  Increase: - VGo: 4-5 clicks per meal (and 1 click for correction at night - if absolutely needed).  If sugars before meals <60, do not take Novolog before that meal. If sugars before meals <90, try to move the bolus right before the meal At bedtime, only correct sugars >433, and only 1 click of  Novolog.  Please return in 3 months.   - we checked her HbA1c: 8.1% (lower) - advised to check sugars at different times of the day - 4x a day, rotating check times - advised for yearly eye exams >> she is UTD - return to clinic in 1 month  2. HL -Reviewed latest lipid panel from a year ago: Fractions at goal: Lab Results  Component Value Date   CHOL 174 06/06/2019   HDL 74.50 06/06/2019   LDLCALC 83 06/06/2019   TRIG 84.0 06/06/2019   CHOLHDL 2 06/06/2019  -She is on Lipitor 20, without side effects  3.  Obesity class I -Continues Ozempic which should also help with weight loss -She continues on a plant-based diet -At last visit, she lost 8 pounds from the previous visit -Weight is approximately stable since last visit.  Philemon Kingdom, MD PhD [facility] Endocrinology

## 2020-06-25 ENCOUNTER — Telehealth: Payer: Self-pay

## 2020-06-25 NOTE — Telephone Encounter (Signed)
Called and left a message advising pt Dr Cruzita Lederer has a 4:00 pm appt available 07/13/2020. Requested pt call back to confirm availability.

## 2020-06-27 ENCOUNTER — Telehealth: Payer: Self-pay

## 2020-06-27 NOTE — Telephone Encounter (Signed)
Inbound fax from [facility] requesting clinical notes from pt's last OV. Notes faxed to [phone]

## 2020-07-03 ENCOUNTER — Telehealth: Payer: Self-pay | Admitting: Internal Medicine

## 2020-07-03 NOTE — Telephone Encounter (Signed)
MEDICATION: DEXCOM G6 Sensors & Transmitters  PHARMACY:  [facility] [address], [address] [place]  HAS THE PATIENT CONTACTED THEIR PHARMACY?  yes  IS THIS A 90 DAY SUPPLY :  YES  IS PATIENT OUT OF MEDICATION: almost  IF NOT; HOW MUCH IS LEFT:   LAST APPOINTMENT DATE: @4 /13/2022  NEXT APPOINTMENT DATE:@4 /29/2022  DO WE HAVE YOUR PERMISSION TO LEAVE A DETAILED MESSAGE?:  OTHER COMMENTS:  Name on [place] prescriptions have to read Molly Wall because of how her Medicare card shows this name    **Let patient know to contact pharmacy at the end of the day to make sure medication is ready. **  ** Please notify patient to allow 48-72 hours to process**  **Encourage patient to contact the pharmacy for refills or they can request refills through [facility]**

## 2020-07-06 ENCOUNTER — Other Ambulatory Visit: Payer: Self-pay | Admitting: *Deleted

## 2020-07-06 ENCOUNTER — Other Ambulatory Visit: Payer: Self-pay | Admitting: Internal Medicine

## 2020-07-06 MED ORDER — DEXCOM G6 TRANSMITTER MISC
1.0000 | 3 refills | Status: DC
Start: 2020-07-06 — End: 2020-07-06

## 2020-07-06 MED ORDER — DEXCOM G6 SENSOR MISC
1 refills | Status: DC
Start: 2020-07-06 — End: 2020-08-02

## 2020-07-10 ENCOUNTER — Other Ambulatory Visit: Payer: Self-pay | Admitting: Primary Care

## 2020-07-10 DIAGNOSIS — J4520 Mild intermittent asthma, uncomplicated: Secondary | ICD-10-CM

## 2020-07-10 DIAGNOSIS — J45909 Unspecified asthma, uncomplicated: Secondary | ICD-10-CM

## 2020-07-10 NOTE — Telephone Encounter (Signed)
Pt left v/m that singulair was requested last wk from [facility] [place]; pt request refill for singulair to [facility] [place] due to allergies. Pt request cb when refilled.

## 2020-07-10 NOTE — Telephone Encounter (Signed)
Spoke with patient about this again this morning. I advised patient that I did not see another refill request for Singulair in the sytem just for Albuterol. Patient was told by the pharmacy they sent it over and it was denied, she thinks maybe because pharmacy has her in the system with high-finated last name that may have caused the issue. Patient needs this medication filled today please. Her symptoms flaring up without taking it daily. Thank you!

## 2020-07-10 NOTE — Telephone Encounter (Signed)
Patient advised.

## 2020-07-11 ENCOUNTER — Telehealth: Payer: Self-pay | Admitting: Internal Medicine

## 2020-07-11 NOTE — Telephone Encounter (Signed)
Patient called re: Patient states her blood sugars have   dropped suddenly 3 times today (average blood sugar level 58, went down to 45 earlier-lowest was at 6:30 am with finger prick was 45-monitor said 59). Patient states her blood sugar levels start at 58, then she will drink grape juice and eat a Snickers bar then her blood sugars go up to between 199 and 230, then blood sugars start going down again. Patient requests to be called asap at ph# [phone] to be advised. Patient states she does not know what to do.

## 2020-07-11 NOTE — Telephone Encounter (Signed)
Called and spoke with pt and she advised: Over the past few days she has gotten low alerts on her Dexcom overnight early morning and when testing her blood sugar due to fatigue and dizziness there have been in the 60's 50's and even 45. Pt was reminded of last instructions of:  Increase: - VGo: 4-5 clicks per meal (and 1 click for correction at night - if absolutely needed).  If sugars before meals <60, do not take Novolog before that meal. If sugars before meals <90, try to move the bolus right before the meal At bedtime, only correct sugars >300, and only 1 click of Novolog. Pt says she may have been correcting sugars over 220-230 not 250 and she will correct that but when her sugars dropped overnight 07/10/20 she had not administered a click to correct 12/07/28 and her sugars still dropped below 50. Pt wanting to know what she may need to do avoid the dangerous lows.

## 2020-07-12 NOTE — Telephone Encounter (Signed)
T, if her sugars are dropping overnight without correction at bedtime, we need to change the VGo from 30 to 20.  Can you send 30 VGo pods to her pharmacy with 5 refills?

## 2020-07-13 ENCOUNTER — Other Ambulatory Visit: Payer: Self-pay

## 2020-07-13 ENCOUNTER — Ambulatory Visit (INDEPENDENT_AMBULATORY_CARE_PROVIDER_SITE_OTHER): Payer: Medicare Other | Admitting: Internal Medicine

## 2020-07-13 ENCOUNTER — Encounter: Payer: Self-pay | Admitting: Internal Medicine

## 2020-07-13 VITALS — BP 128/82 | HR 87 | Ht 64.0 in | Wt 206.8 lb

## 2020-07-13 DIAGNOSIS — E1165 Type 2 diabetes mellitus with hyperglycemia: Secondary | ICD-10-CM | POA: Diagnosis not present

## 2020-07-13 DIAGNOSIS — E785 Hyperlipidemia, unspecified: Secondary | ICD-10-CM

## 2020-07-13 DIAGNOSIS — E669 Obesity, unspecified: Secondary | ICD-10-CM

## 2020-07-13 DIAGNOSIS — Z794 Long term (current) use of insulin: Secondary | ICD-10-CM | POA: Diagnosis not present

## 2020-07-13 MED ORDER — V-GO 20 KIT: PACK | 11 refills | Status: DC

## 2020-07-13 NOTE — Progress Notes (Signed)
Patient ID: Molly Wall, female   DOB: 19-Feb-1946, 75 y.o.   MRN: [id number]   This visit occurred during the SARS-CoV-2 public health emergency.  Safety protocols were in place, including screening questions prior to the visit, additional usage of staff PPE, and extensive cleaning of exam room while observing appropriate contact time as indicated for disinfecting solutions.   HPI: Molly Wall is a 75 y.o.-year-old female, initially referred by her PCP, Alma Friendly, MD, returning for follow-up for DM2, dx at 75 (2017), h/o reactive hypoglycemia, insulin-dependent, uncontrolled, without long-term complications.  Last visit 1 month ago.  Interim history: She continues on a plant-based diet. 2 days ago she called with low blood sugars overnight and we switched her from [place] to a VGo20. She is preparing to go on a trip to [place] soon.  Reviewed HbA1c levels: Lab Results  Component Value Date   HGBA1C 8.1 (A) 06/21/2020   HGBA1C 8.7 (A) 03/19/2020   HGBA1C 8.2 (A) 09/07/2019   HGBA1C 9.4 (H) 06/06/2019   HGBA1C 9.1 (A) 02/08/2019   HGBA1C 9.6 (A) 10/27/2018   HGBA1C 8.2 (A) 07/30/2018   HGBA1C 11.1 (A) 04/27/2018   HGBA1C 11.3 (H) 12/09/2017   HGBA1C 6.8 (H) 04/10/2016   She is on: - Metformin ER 1000 mg with breakfast - Ozempic 0.5 >> 1 mg weekly -however, at this visit, upon questioning she is actually taking much less than this, by mistake - VGo 30 4-5 >> 3-4 clicks per meal and 2-3 clicks >> 1 clicks for correction at night >>   4-5 clicks per meal (she did not switch to VGo20 yet)  Previously on: - Lantus 35 >> 25 units in am >> 25 units in a.m. and 10 units at bedtime >> 20 units in a.m. and 10 units at night - Novolog 7-9 units before 15 min before meals >> B'fast: 6-8 units  Lunch: 10-11 units Dinner: 6-7 units If sugars before meals <60, do not take Novolog before that meal. If sugars before meals <90, take 50% of the Novolog before that meal At bedtime, only  correct sugars >250, and only with up to 5 units of Novolog.  She checks her sugars more than 4 times a day with her CGM.   Prev.:   Lowest sugar was  42 x1 >> 40s >> 45 (fingerstick); she has hypoglycemia awareness in the 80s. Highest sugar was 591 >> 400s >> 500s >> 300s.  Glucometer: One Touch Verio  -No CKD, last BUN/creatinine:  Lab Results  Component Value Date   BUN 21 01/06/2019   BUN 17 12/30/2018   CREATININE 0.82 01/06/2019   CREATININE 0.71 12/30/2018  Not on ACE inhibitor/ARB.  -No HL; last set of lipids: Lab Results  Component Value Date   CHOL 174 06/06/2019   HDL 74.50 06/06/2019   LDLCALC 83 06/06/2019   TRIG 84.0 06/06/2019   CHOLHDL 2 06/06/2019  On Lipitor 20.  - last eye exam was in 06/2019: No DR  -No numbness and tingling in her feet.  Pt has FH of DM in GF.  She has OSA and started a CPAP after which she felt much better.  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    She is on levothyroxine 88 mcg 1 tablet 6/7 days and 1.5 tablets  1/7 days.    Latest TSH was normal: Lab Results  Component Value Date   TSH 0.76 02/21/2020   She lives alone.  ROS: Constitutional: no weight gain/no weight loss, no fatigue, no subjective hyperthermia, no subjective hypothermia Eyes: no blurry vision, no xerophthalmia ENT: no sore throat, no nodules palpated in neck, no dysphagia, no odynophagia, no hoarseness Cardiovascular: no CP/no SOB/no palpitations/no leg swelling Respiratory: no cough/no SOB/no wheezing Gastrointestinal: no N/no V/no D/no C/no acid reflux Musculoskeletal: no muscle aches/no joint aches Skin: no rashes, no hair loss Neurological: no tremors/no numbness/no tingling/no dizziness  I reviewed pt's medications, allergies, PMH, social hx, family hx, and changes  were documented in the history of present illness. Otherwise, unchanged from my initial visit note.  Past Medical History:  Diagnosis Date  . Allergic rhinitis   . Allergy   . Asthma   . Atypical ductal hyperplasia of right breast   . 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 Breast cancer ([place])   . Diabetes mellitus without complication ([place])   . Family history of breast cancer   . Family history of colon cancer   . Hypercholesteremia   . Hypothyroidism   . Lumbar herniated disc   . OSA (obstructive sleep apnea)    used to use CPAP, not any more over 1 1/2 yrs  . Salivary stone    left side  . Sleep apnea    no cpap in 2 yrs   . Vitamin D deficiency    Past Surgical History:  Procedure Laterality Date  . APPENDECTOMY    . BREAST LUMPECTOMY WITH RADIOACTIVE SEED LOCALIZATION Right 08/12/2017   Procedure: RIGHT BREAST LUMPECTOMY X'S 2 WITH RADIOACTIVE SEED LOCALIZATION X'S 2;  Surgeon: Fanny Skates, MD;  Location: [facility];  Service: General;  Laterality: Right;  . COLONOSCOPY     11 yrs ago in [place] but given a 10 yr recall per pt   . GANGLION CYST EXCISION     75 years old  . INCISION AND DRAINAGE ABSCESS ANAL    . KNEE SURGERY  2014  . [place]  . TONSILLECTOMY     age 75   Social History   Socioeconomic History  . Marital status: Divorced    Spouse name: Not on file  . Number of children: 2  . Years of education: Not on file  . Highest education level: Not on file  Occupational History  . Not on file  Tobacco Use  . Smoking status: Former Smoker    Packs/day: 1.00    Years: 2.00    Pack years: 2.00    Types: Cigarettes  . Smokeless tobacco: Never Used  . Tobacco comment: quite smoker at age 13  Substance and Sexual Activity  . Alcohol use: Yes    Alcohol/week: 0.0 - 1.0 standard drinks    Comment: 0-1 per month  . Drug use: No  . Sexual activity: Not Currently  Other Topics Concern  . Not  on file  Social History Narrative   Single.   Moved from [place] to [place].   Family lives in [place].   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   Social Determinants of Health   Financial Resource Strain:   . Difficulty of Paying Living Expenses: Not on file  Food Insecurity:   . Worried About Charity fundraiser in  the Last Year: Not on file  . Ran Out of Food in the Last Year: Not on file  Transportation Needs:   . Lack of Transportation (Medical): Not on file  . Lack of Transportation (Non-Medical): Not on file  Physical Activity:   . Days of Exercise per Week: Not on file  . Minutes of Exercise per Session: Not on file  Stress:   . Feeling of Stress : Not on file  Social Connections:   . Frequency of Communication with Friends and Family: Not on file  . Frequency of Social Gatherings with Friends and Family: Not on file  . Attends Religious Services: Not on file  . Active Member of Clubs or Organizations: Not on file  . Attends Archivist Meetings: Not on file  . Marital Status: Not on file  Intimate Partner Violence:   . Fear of Current or Ex-Partner: Not on file  . Emotionally Abused: Not on file  . Physically Abused: Not on file  . Sexually Abused: Not on file   Current Outpatient Medications on File Prior to Visit  Medication Sig Dispense Refill  . albuterol (VENTOLIN HFA) 108 (90 Base) MCG/ACT inhaler INHALE 2 PUFFS BY MOUTH EVERY 6 HOURS AS NEEDED FOR WHEEZE OR SHORTNESS OF BREATH 8 g 0  . anastrozole (ARIMIDEX) 1 MG tablet TAKE 1 TABLET BY MOUTH EVERY DAY 90 tablet 4  . atorvastatin (LIPITOR) 20 MG tablet TAKE 1 TABLET BY MOUTH EVERY DAY FOR CHOLESTEROL 90 tablet 1  . Continuous Blood Gluc Sensor (DEXCOM G6 SENSOR) MISC Use one sensor every 10 days 9 each 1  . Continuous Blood Gluc Transmit (DEXCOM G6 TRANSMITTER) MISC 1 DEVICE BY DOES NOT APPLY ROUTE EVERY 3 (THREE)  MONTHS. 1 each 3  . fluticasone (FLONASE) 50 MCG/ACT nasal spray INSTILL 1 SPRAY IN EACH NOSTRIL TWICE A DAY AS NEEDED FOR ALLERGIES OR RHINITIS 48 mL 0  . insulin aspart (NOVOLOG) 100 UNIT/ML injection Use 66 units of insulin in the VGo pump daily. 30 mL PRN  . Insulin Disposable Pump (V-GO 30) KIT Use 1 kit a day 90 kit 3  . Insulin Pen Needle 32G X 4 MM MISC Use 5-6x a day for insulin injections 500 each 3  . levothyroxine (SYNTHROID) 88 MCG tablet TAKE 1 AND 1/2 TABLET BY MOUTH EVERY SUNDAY. TAKE 1 TABLET BY MOUTH MON THROUGH SAT. TAKE ON AN EMPTY STOMACH WITH WATER ONLY. NO FOOD OR OTHER MEDICATIONS FOR 30 MINUTES. 98 tablet 1  . metFORMIN (GLUCOPHAGE-XR) 500 MG 24 hr tablet TAKE 2 TABLETS BY MOUTH EVERY DAY IN THE MORNING 60 tablet 5  . montelukast (SINGULAIR) 10 MG tablet TAKE 1 TABLET (10 MG TOTAL) BY MOUTH AT BEDTIME. FOR ALLERGIES AND ASTHMA. 90 tablet 1  . Semaglutide, 1 MG/DOSE, (OZEMPIC, 1 MG/DOSE,) 2 MG/1.5ML SOPN Inject 1 mg into the skin once a week. 6 pen 3  . WIXELA INHUB 250-50 MCG/DOSE AEPB TAKE 1 PUFF BY MOUTH TWICE A DAY 60 each 3  . WIXELA INHUB 500-50 MCG/DOSE AEPB TAKE 1 PUFF BY MOUTH TWICE A DAY (Patient taking differently: Inhale 1 puff into the lungs daily.) 60 each 0   No current facility-administered medications on file prior to visit.   Allergies  Allergen Reactions  . 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 She described it was a small amount in a milk carton in [place]. She states they stopped using it  shortly after because the trial wasn't successful. SPM   Family History  Problem Relation Age of Onset  . Alzheimer's disease Mother   . Asthma Mother   . Dementia Mother        cause of death  . Arthritis Father   . Heart attack Father 73       cause of death  . Breast cancer Sister 42  . Healthy Daughter   . Colon cancer Paternal Grandmother   . Liver cancer Maternal Uncle    . Healthy Daughter   . Healthy Brother   . Healthy Sister   . Colon polyps Neg Hx   . Rectal cancer Neg Hx   . Stomach cancer Neg Hx    PE: BP 128/82 (BP Location: Right Arm, Patient Position: Sitting, Cuff Size: Normal)   Pulse 87   Ht _0  (1.626 m)   Wt 206 lb 12.8 oz (93.8 kg)   SpO2 96%   BMI 35.50 kg/m  Wt Readings from Last 3 Encounters:  07/13/20 206 lb 12.8 oz (93.8 kg)  06/21/20 202 lb (91.6 kg)  05/31/20 201 lb (91.2 kg)   Constitutional: overweight, in NAD Eyes: PERRLA, EOMI, no exophthalmos ENT: moist mucous membranes, no thyromegaly, no cervical lymphadenopathy Cardiovascular: RRR, No MRG Respiratory: CTA B Gastrointestinal: abdomen soft, NT, ND, BS+ Musculoskeletal: no deformities, strength intact in all 4 Skin: moist, warm, no rashes Neurological: no tremor with outstretched hands, DTR normal in all 4  ASSESSMENT: 1. DM2, insulin-dependent, uncontrolled, without long-term complications, but with hyperglycemia  2. HL  3. Obesity class 1  PLAN:  1. Patient with longstanding, insulin-dependent diabetes, on basal-bolus insulin regimen and also metformin and weekly GLP-1 receptor agonist, with improved control after she started a plant-based diet, however, we still fluctuating blood sugars.  We started VGo patch pump before last visit.  HbA1c decreased from 8.7% to 8.1% at last visit.  At that time she had low blood sugars, in the 40s and 50s, followed by a period with high blood sugars in the 300s and 400s right before our last visit.  At last visit sugars were only slightly better than before with more values within target range but her sugars were increasing throughout the day mostly as she was not injecting the insulin correctly 15 minutes before meals.  We discussed about the importance of doing so and I also advised her to use a slightly higher dose of insulin (increased the number of clicks) before meals. -She called 2 days ago with lower blood sugars  overnight without correcting hypoglycemia at that time so I advised her to switch to a VGo20 pump.  She did not do so yet. CGM interpretation: -At today's visit, we reviewed her CGM downloads: It appears that 53.3% of values are in target range (goal >70%), while 45.2% are higher than 180 (goal <25%), and 1.5% are lower than 70 (goal <4%).  The calculated average blood sugar is 183.  The projected HbA1c for the next 3 months (GMI) is 7.7%. -Reviewing the CGM trends, sugars are dropping abruptly beyond 2 AM with a nadir of around 8-9 AM and they are increasing precipitously after 1 PM. -Upon questioning, she is only using a fraction of the Ozempic dose (turns the pen by only 1 click, rather than going all the way up to 1 mg dose).  I advised her that this is a homeopathic dose.  We will increase the dose to 0.5 mg weekly and then to 1 mg  weekly as tolerated.  I think this will greatly help with her increasing blood sugars throughout the day.  I advised her that she may need less clicks after increasing the Ozempic dose to avoid hypoglycemia.  I still advised her to decrease the VGo size from 30 to 20 due to the abrupt decrease in blood sugars overnight and especially with her upcoming trip in which most likely she will be more active.  - I suggested to:  Patient Instructions  Please continue: - Metformin ER 1000 mg with breakfast - [id number] with 4-5 clicks before meal (and 1 click for correction at night - if absolutely needed).  If sugars before meals <60, do not take Novolog before that meal. If sugars before meals <90, try to move the bolus right before the meal At bedtime, only correct sugars >864, and only 1 click of Novolog.  Increase: - Ozempic to 0.5 mg weekly and then to 1 mg weekly when tolerated  Please return in 3 months.    - advised to check sugars at different times of the day - 4x a day, rotating check times - advised for yearly eye exams >> she is UTD - return to clinic in 3  months  2. HL -Reviewed latest lipid panel from 05/2019: All fractions at goal: Lab Results  Component Value Date   CHOL 174 06/06/2019   HDL 74.50 06/06/2019   LDLCALC 83 06/06/2019   TRIG 84.0 06/06/2019   CHOLHDL 2 06/06/2019  -She continues on Lipitor 20 mg daily, without side effects -She is due for another lipid panel-we will check this at next visit  3.  Obesity class I -She continues Ozempic, which should also help with weight loss -She continues on a plant-based diet -Weight was stable at last visit  Philemon Kingdom, MD PhD [facility] Endocr[facility]ology

## 2020-07-13 NOTE — Patient Instructions (Addendum)
Please continue: - Metformin ER 1000 mg with breakfast - [id number] with 4-5 clicks before meal (and 1 click for correction at night - if absolutely needed).  If sugars before meals <60, do not take Novolog before that meal. If sugars before meals <90, try to move the bolus right before the meal At bedtime, only correct sugars >993, and only 1 click of Novolog.  Increase: - Ozempic to 0.5 mg weekly and then to 1 mg weekly when tolerated  Please return in 3 months.

## 2020-07-17 ENCOUNTER — Telehealth: Payer: Self-pay | Admitting: Internal Medicine

## 2020-07-17 DIAGNOSIS — E1165 Type 2 diabetes mellitus with hyperglycemia: Secondary | ICD-10-CM

## 2020-07-17 DIAGNOSIS — Z794 Long term (current) use of insulin: Secondary | ICD-10-CM

## 2020-07-17 NOTE — Telephone Encounter (Signed)
It should say:one V-Go 20/day  Dosage form: Device

## 2020-07-17 NOTE — Telephone Encounter (Signed)
Pt would like a call back regarding about her ozempic. Pt is going out of country and needs a call back either today  from the nurse. Call pt back as soon as possible

## 2020-07-17 NOTE — Telephone Encounter (Signed)
Rx sent to preferred pharmacy.

## 2020-07-18 ENCOUNTER — Telehealth: Payer: Self-pay | Admitting: Internal Medicine

## 2020-07-18 MED ORDER — OZEMPIC (1 MG/DOSE) 2 MG/1.5ML ~~LOC~~ SOPN: 1.0000 mg | PEN_INJECTOR | SUBCUTANEOUS | 3 refills | Status: DC

## 2020-07-18 NOTE — Telephone Encounter (Signed)
Rx sent to preferred pharmacy.

## 2020-07-18 NOTE — Telephone Encounter (Signed)
Pt called to request we send another box of her V-GO 20 to the pharmacy below since she will be leaving the country next Wednesday. Pt says the reason why she isnt able to get it is because of an insurance issue. She states we need to call the insurance [facility] [employer] [phone]) and tell them why she needs another box (which she says is because she realized she was using too much?). Pt also states we also need to tell them she takes the V-GO 20 instead of the V-GO 30 because that one caused troubles.  Any questions she says the nurse can give her a call.  [id number] - [place], [place] Phone:  [phone]  Fax:  [phone]

## 2020-07-19 ENCOUNTER — Encounter: Payer: Self-pay | Admitting: Internal Medicine

## 2020-07-19 NOTE — Telephone Encounter (Signed)
Attempted to call pt's insurance at [facility] [phone]) redirected to another department and provided another number to call [phone] at the time of call the office was closed. Will attempt tomorrow 07/20/20 during business hours

## 2020-07-19 NOTE — Telephone Encounter (Signed)
Patient called to follow up on My Chart message - states really just needs a call to be made to Medicare to verbally ok the V-GO 30

## 2020-07-20 ENCOUNTER — Telehealth: Payer: Self-pay

## 2020-07-20 NOTE — Telephone Encounter (Signed)
Patient called again re: Patient's name for Medicare is [facility]. Patient states she has been calling and sending MyChart messages since last week. Patient has received 1 request each time she has called (total of 4 RX's and has received 3 RX's) but Patient has not received her VGO 70 (Patient is leaving the Country on Jul 25, 2020) and patient states the VGO 20 RX is at the [facility] but is unable to get it until Dr. Kelton Pillar tells [facility] that Patient is getting too much therefore, it is causing dangerous blood sugar drops.

## 2020-07-20 NOTE — Telephone Encounter (Signed)
Patient left a voicemail stating Dr. Estanislado Pandy recommended an over the counter cream for her trigger finger and she cannot remember the name.  Patient states she is going out of town and wants to purchase a tube before she leaves.  Patient requested a return call.

## 2020-07-20 NOTE — Telephone Encounter (Signed)
I called patient, Voltaren gel.

## 2020-07-23 ENCOUNTER — Encounter: Payer: Self-pay | Admitting: Internal Medicine

## 2020-07-23 DIAGNOSIS — Z794 Long term (current) use of insulin: Secondary | ICD-10-CM

## 2020-07-23 DIAGNOSIS — E1165 Type 2 diabetes mellitus with hyperglycemia: Secondary | ICD-10-CM

## 2020-07-23 MED ORDER — DEXCOM G6 TRANSMITTER MISC: 1.0000 | 3 refills | Status: DC

## 2020-07-23 NOTE — Telephone Encounter (Signed)
Called and advised pt rx has been sent. Also advised samples of VGO 20 are here at the office ready for pick up until we can get her insurance to cover them.

## 2020-07-24 ENCOUNTER — Telehealth: Payer: Self-pay | Admitting: Internal Medicine

## 2020-07-24 NOTE — Telephone Encounter (Signed)
Patient came in saying she has 3 problems.  1. She is not able to get the V-Go 20s, because she got a 3 month supply of 30s, and she "must use those up first"  She is leaving the country on Thursday morning for 2 weeks. 2.  Her insurance will not send her a transmitter for her Dexcom, saying she has to get another receiver???? 3.  She was ordered a 3 month supply of Novolog, but it will not be here before she leaves on Thursday. Solutions: 1.  She was given a 4 weeks sample of V-go 20s. 2.  She was given a new Dexcom transmtter, and reminded to put that new transmitter number in her phone and reader before starting this. 3.  We have no more samples of Novolog vials, so I called the representative, and she will bring some by tomorrow. The patient was told to come by tomorrow at 4:30PM to pick up the Novolog sample.

## 2020-07-24 NOTE — Telephone Encounter (Signed)
Pt given samples until we can get her insurance to cover VGO 20.

## 2020-07-24 NOTE — Telephone Encounter (Signed)
Error

## 2020-07-24 NOTE — Telephone Encounter (Signed)
Patient picked up VGO 20 Samples, on 07/24/2020 at 1:55pm.

## 2020-07-24 NOTE — Telephone Encounter (Signed)
Thank you, Molly Wall- you are a lifesaver!

## 2020-07-25 ENCOUNTER — Telehealth: Payer: Self-pay | Admitting: Internal Medicine

## 2020-07-25 NOTE — Telephone Encounter (Signed)
Pt came to pick up novolog at 4:50pm on 07/25/2020

## 2020-07-31 ENCOUNTER — Other Ambulatory Visit: Payer: Self-pay | Admitting: Internal Medicine

## 2020-08-02 ENCOUNTER — Other Ambulatory Visit: Payer: Self-pay | Admitting: *Deleted

## 2020-08-02 DIAGNOSIS — E1165 Type 2 diabetes mellitus with hyperglycemia: Secondary | ICD-10-CM

## 2020-08-02 MED ORDER — DEXCOM G6 SENSOR MISC
1 refills | Status: DC
Start: 2020-08-02 — End: 2020-11-07

## 2020-08-02 MED ORDER — DEXCOM G6 TRANSMITTER MISC: 1.0000 | 3 refills | Status: DC

## 2020-08-02 MED ORDER — DEXCOM G6 RECEIVER DEVI
0 refills | Status: DC
Start: 2020-08-02 — End: 2020-11-07

## 2020-08-07 ENCOUNTER — Encounter: Payer: Self-pay | Admitting: Internal Medicine

## 2020-08-08 ENCOUNTER — Ambulatory Visit: Payer: Self-pay

## 2020-08-08 ENCOUNTER — Other Ambulatory Visit: Payer: Self-pay | Admitting: Internal Medicine

## 2020-08-08 ENCOUNTER — Other Ambulatory Visit: Payer: Self-pay

## 2020-08-08 ENCOUNTER — Ambulatory Visit (INDEPENDENT_AMBULATORY_CARE_PROVIDER_SITE_OTHER): Payer: Medicare Other | Admitting: Rheumatology

## 2020-08-08 DIAGNOSIS — M19041 Primary osteoarthritis, right hand: Secondary | ICD-10-CM

## 2020-08-08 DIAGNOSIS — M25541 Pain in joints of right hand: Secondary | ICD-10-CM | POA: Diagnosis not present

## 2020-08-08 DIAGNOSIS — M19042 Primary osteoarthritis, left hand: Secondary | ICD-10-CM

## 2020-08-08 DIAGNOSIS — R768 Other specified abnormal immunological findings in serum: Secondary | ICD-10-CM

## 2020-08-08 DIAGNOSIS — M25542 Pain in joints of left hand: Secondary | ICD-10-CM | POA: Diagnosis not present

## 2020-08-08 MED ORDER — OZEMPIC (0.25 OR 0.5 MG/DOSE) 2 MG/1.5ML ~~LOC~~ SOPN: 0.5000 mg | PEN_INJECTOR | SUBCUTANEOUS | 3 refills | Status: DC

## 2020-08-11 ENCOUNTER — Other Ambulatory Visit: Payer: Self-pay | Admitting: Primary Care

## 2020-08-11 DIAGNOSIS — E785 Hyperlipidemia, unspecified: Secondary | ICD-10-CM

## 2020-08-11 NOTE — Telephone Encounter (Signed)
Office visit required in September 2022 for further refills after September 2022.

## 2020-08-15 ENCOUNTER — Other Ambulatory Visit: Payer: Self-pay

## 2020-08-15 DIAGNOSIS — E039 Hypothyroidism, unspecified: Secondary | ICD-10-CM

## 2020-08-15 MED ORDER — LEVOTHYROXINE SODIUM 88 MCG PO TABS: ORAL_TABLET | ORAL | 0 refills | Status: DC

## 2020-08-15 NOTE — Telephone Encounter (Signed)
Called patient follow up made and at patient request levothyroxine has been called in as well.

## 2020-08-17 ENCOUNTER — Other Ambulatory Visit: Payer: Self-pay | Admitting: Internal Medicine

## 2020-08-17 MED ORDER — V-GO 20 KIT: PACK | 3 refills | Status: DC

## 2020-08-19 IMAGING — MR MR BREAST BX W LOC DEV 1ST LESION IMAGE BX SPEC MR GUIDE*R*
8 of 10 series · 34 of 48 positions shown · IV contrast (9 ml gadavist)
Comparison: Previous exams.

Addendum:
CLINICAL DATA: 72-year-old female for tissue sampling of abnormal
non masslike enhancement within the INNER RIGHT breast along the
surgical bed of ADH/ALH excision..

EXAM:
MRI GUIDED CORE NEEDLE BIOPSY OF THE RIGHT BREAST
TECHNIQUE: Multiplanar, multisequence MR imaging of the RIGHT breast was
performed both before and after administration of intravenous
contrast.
CONTRAST:  9 cc MultiHance

[Series 2: fiducial unilateral · sagittal · 2.0mm · 1.33mm/px · 3 of 52 slices shown]
[im 1/52]
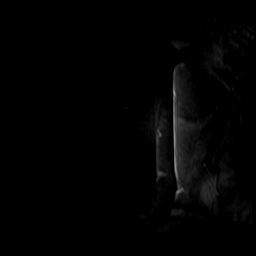
[im 26/52]
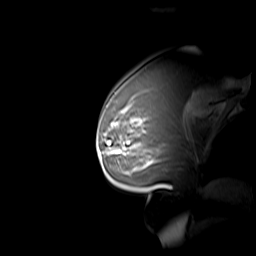
[im 52/52]
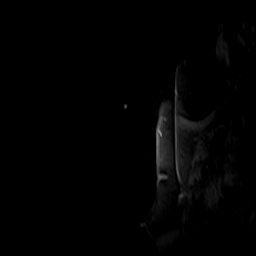

[Series 3: dynamic pre · axial · non-contrast · 1.3mm · 0.73mm/px · z∈[-92,+93]mm · 5 of 144 slices shown]
[im 1/144]
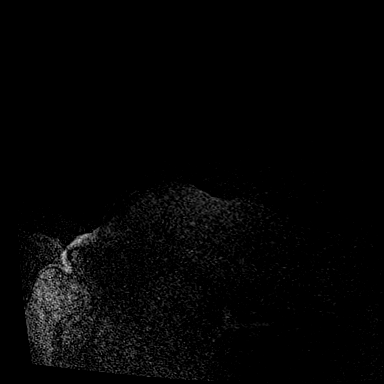
[im 36/144]
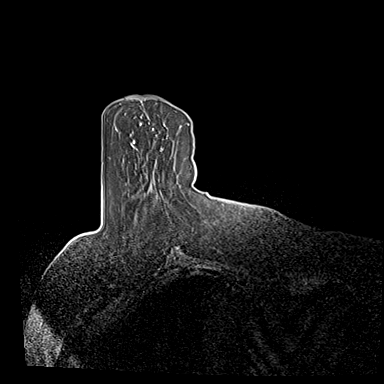
[im 72/144]
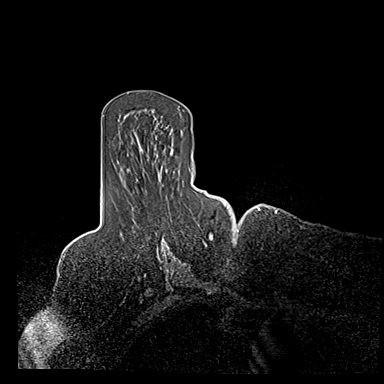
[im 108/144]
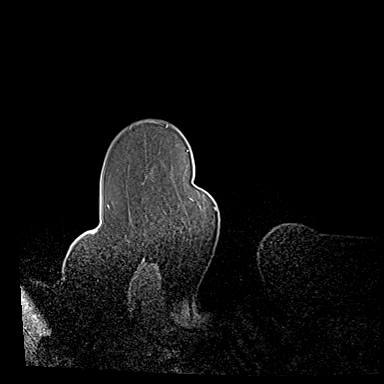
[im 144/144]
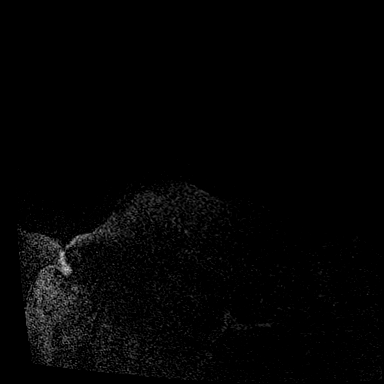

[Series 4: dynamic post 20 · axial · 1.3mm · 0.73mm/px · z∈[-92,+93]mm · 5 of 144 slices shown (1 of 2)]
[im 1/144]
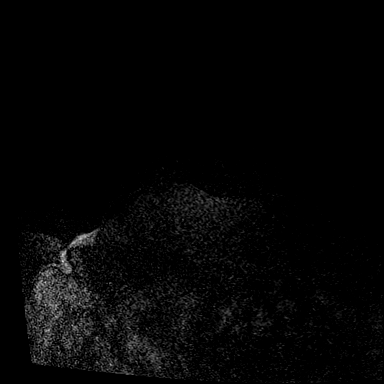
[im 36/144]
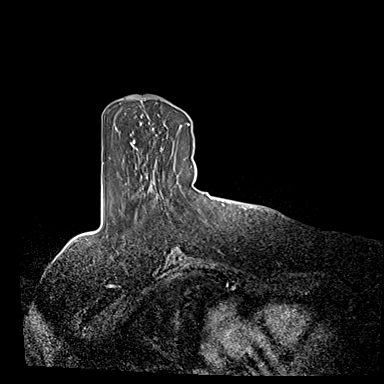
[im 72/144]
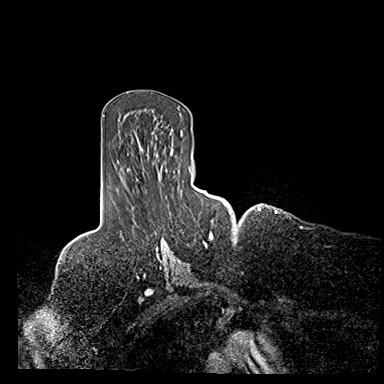
[im 108/144]
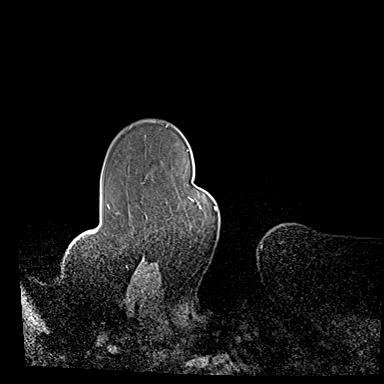
[im 144/144]
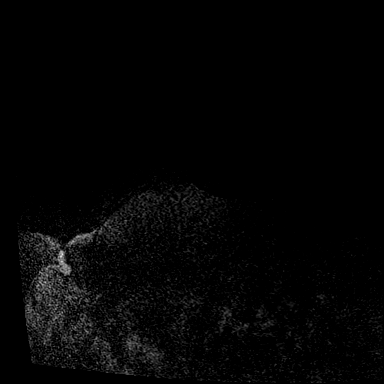

[Series 5: dynamic post 20 · axial · 1.3mm · 0.73mm/px · z∈[-92,+93]mm · 5 of 144 slices shown (2 of 2)]
[im 1/144]
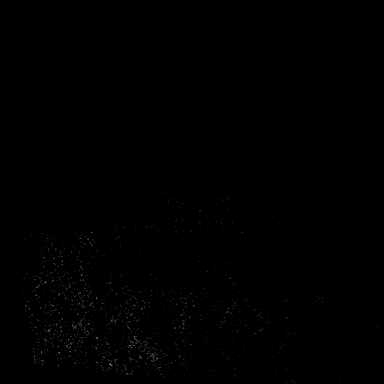
[im 36/144]
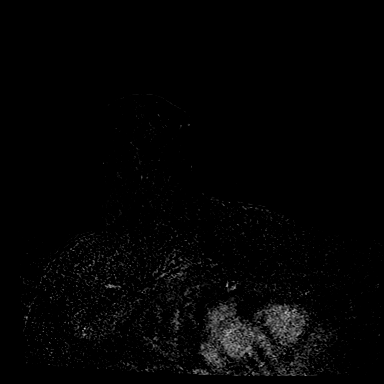
[im 72/144]
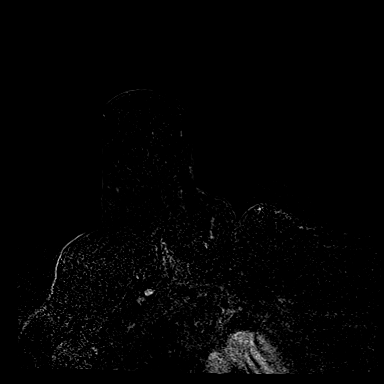
[im 108/144]
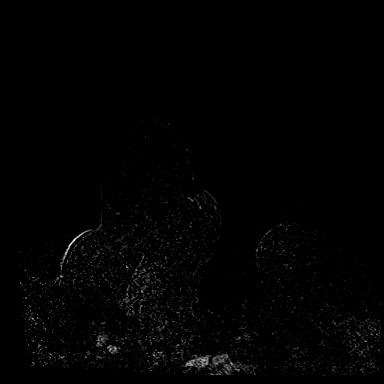
[im 144/144]
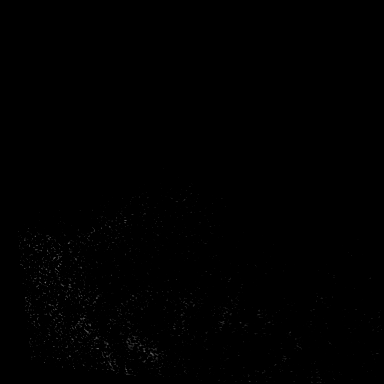

[Series 6: dynamic post 3 · axial · 1.3mm · 0.73mm/px · z∈[-92,+93]mm · 5 of 144 slices shown (1 of 2)]
[im 1/144]
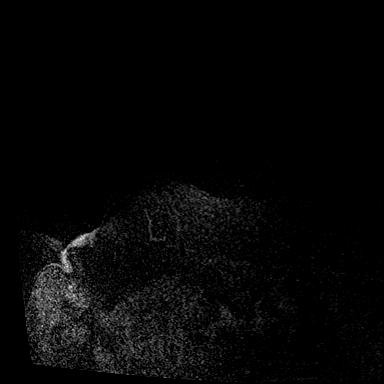
[im 36/144]
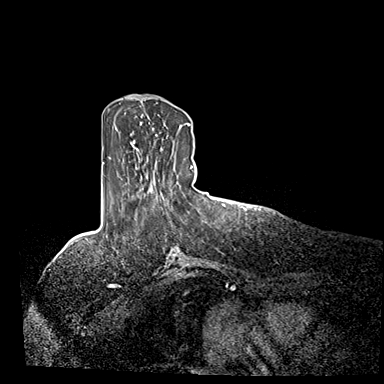
[im 72/144]
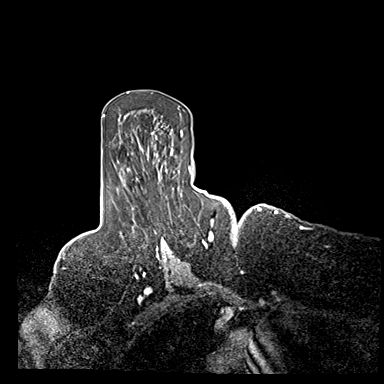
[im 108/144]
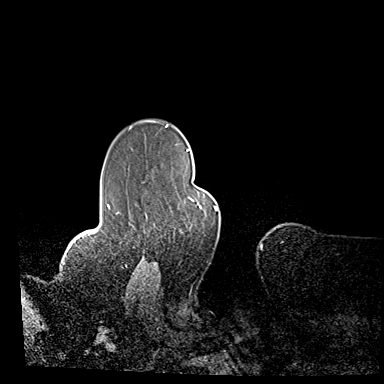
[im 144/144]
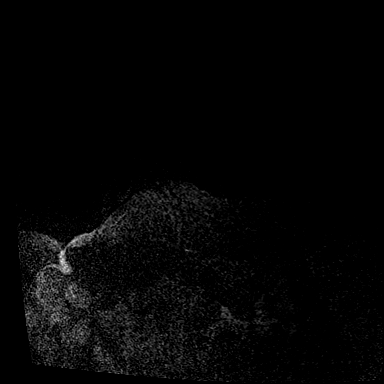

[Series 7: dynamic post 3 · axial · 1.3mm · 0.73mm/px · z∈[-92,+93]mm · 5 of 144 slices shown (2 of 2)]
[im 1/144]
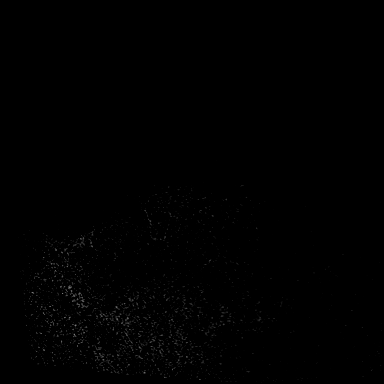
[im 36/144]
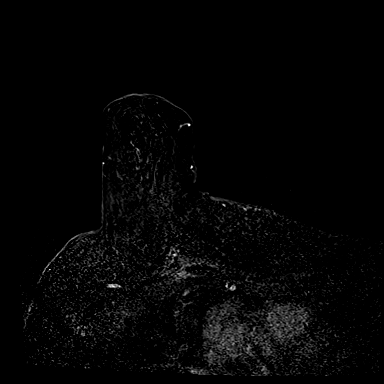
[im 72/144]
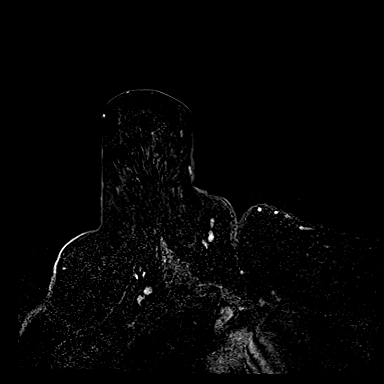
[im 108/144]
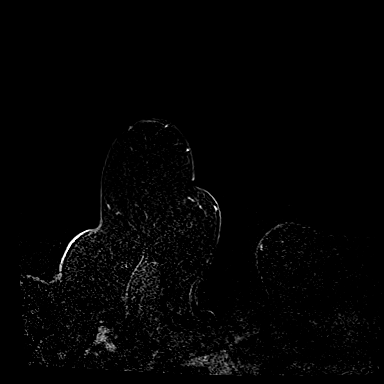
[im 144/144]
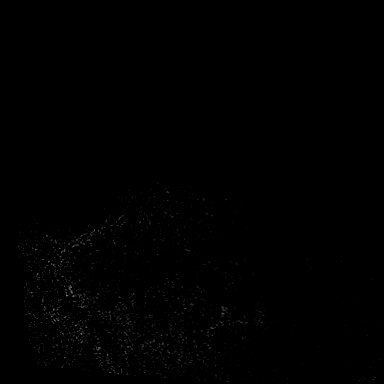

[Series 8: needle confirmation · axial · 1.3mm · 0.73mm/px · z∈[-92,+93]mm · 5 of 144 slices shown]
[im 1/144]
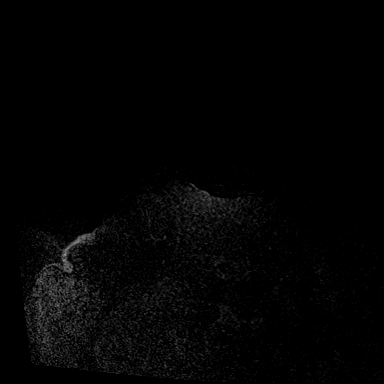
[im 36/144]
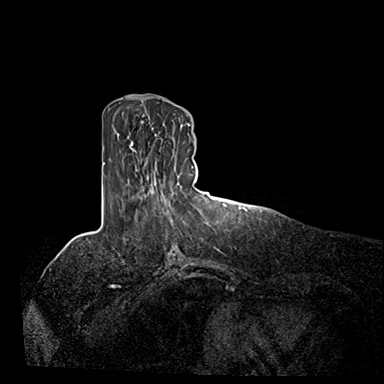
[im 72/144]
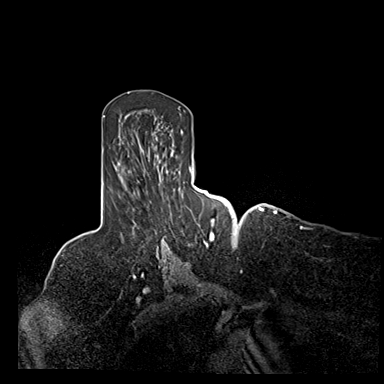
[im 108/144]
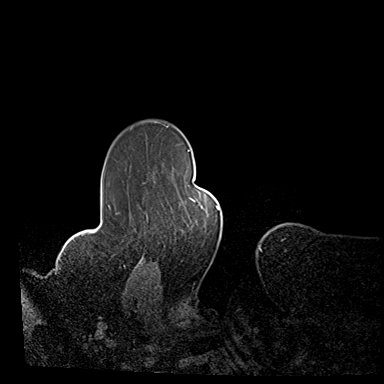
[im 144/144]
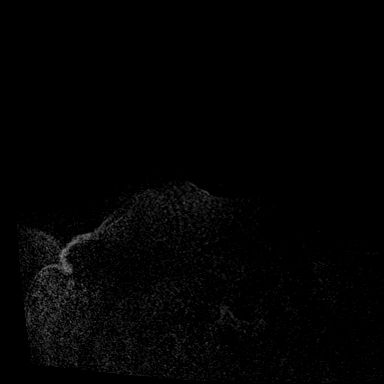

[Series 9: needle confirmation_sub · axial · 1.3mm · 0.73mm/px · 1 of 144 slices shown]
[im 1/144]
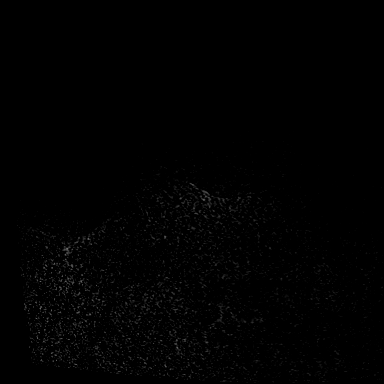

[34 of 48 positions shown; findings below may reference images not displayed]

FINDINGS: I met with the patient, and we discussed the procedure of MRI guided
biopsy, including risks, benefits, and alternatives. Specifically,
we discussed the risks of infection, bleeding, tissue injury, clip
migration, and inadequate sampling. Informed, written consent was
given. The usual time out protocol was performed immediately prior
to the procedure.

Using sterile technique, 1% Lidocaine, MRI guidance, and a 9 gauge
vacuum assisted device, biopsy was performed of non masslike
enhancement within the INNER RIGHT breast along the LATERAL aspect
of the surgical bed using a MEDIAL approach. At the conclusion of
the procedure, a dumbbell tissue marker clip was deployed into the
biopsy cavity. Follow-up 2-view mammogram was performed and dictated
separately.
IMPRESSION: MRI guided biopsy of non masslike enhancement within the INNER RIGHT
breast along the surgical bed. No apparent complications.

ADDENDUM:
Pathology revealed FIBROSIS AND PRIOR RESECTION SITE CHANGES,
FIBROCYSTIC CHANGE of the RIGHT breast, inner. This was found to be
concordant by Dr. Lorraine Jim.

Pathology results were discussed with the patient by telephone. The
patient reported doing well after the biopsy with tenderness at the
site. Post biopsy instructions and care were reviewed and questions
were answered. The patient was encouraged to call The [REDACTED]

The patient was instructed to return for a bilateral breast MRI in 6
months and to continue with annual diagnostic mammography.

Patient's last mammogram was preoperative in Thursday June, 2017.
Bilateral diagnostic mammogram and ultrasound of both breast is
recommended. Clinical and sonographic evaluation of the LEFT nipple
and subareolar region is recommended. Punch biopsy of the LEFT
breast skin may be warranted.

Dr. Nguveren Kasarachi and Dr. Lars Peter Piu were informed of the
results and recommendations via [REDACTED] message on February 24, 2018.

Pathology results reported by Kaki Jim, RN on 02/25/2018.

*** End of Addendum ***

## 2020-08-22 ENCOUNTER — Telehealth: Payer: Self-pay | Admitting: Pharmacy Technician

## 2020-08-22 NOTE — Telephone Encounter (Addendum)
Patient Advocate Encounter   Received notification from [place] that prior authorization for [facility] RECEIVER is required.   PA submitted on 08/22/2020 Ref:  [id number] Status is denied.  I called the patient's plan and currently [facility] AND FREESTYLE glucose monitoring systems are formulary exclusions.  They cover the more traditional Cont[employer].   Pharmacy PA phone number is [phone]    [facility] will continue to follow.   Venida Jarvis. Nadara Mustard, CPhT Patient Advocate [facility] Phone: [phone] Fax:  [phone]

## 2020-08-23 NOTE — Telephone Encounter (Addendum)
Mailed APPEAL letter and office notes to:  [employer] [facility] [employer] [address] [place]  [address]  DENIED.  I spoke with patient and she has one.  Venida Jarvis. Nadara Mustard, CPhT Patient Advocate [facility] Phone: [phone] Fax:  [phone]

## 2020-08-25 ENCOUNTER — Encounter: Payer: Self-pay | Admitting: Internal Medicine

## 2020-09-07 ENCOUNTER — Encounter: Payer: Self-pay | Admitting: Internal Medicine

## 2020-09-12 ENCOUNTER — Other Ambulatory Visit: Payer: Self-pay | Admitting: Primary Care

## 2020-09-12 DIAGNOSIS — E119 Type 2 diabetes mellitus without complications: Secondary | ICD-10-CM

## 2020-09-12 DIAGNOSIS — Z794 Long term (current) use of insulin: Secondary | ICD-10-CM

## 2020-09-12 DIAGNOSIS — J4520 Mild intermittent asthma, uncomplicated: Secondary | ICD-10-CM

## 2020-09-20 ENCOUNTER — Encounter: Payer: Self-pay | Admitting: Internal Medicine

## 2020-09-20 ENCOUNTER — Other Ambulatory Visit: Payer: Self-pay

## 2020-09-20 ENCOUNTER — Ambulatory Visit (INDEPENDENT_AMBULATORY_CARE_PROVIDER_SITE_OTHER): Payer: Medicare Other | Admitting: Internal Medicine

## 2020-09-20 VITALS — BP 128/80 | HR 75 | Ht 64.0 in | Wt 194.8 lb

## 2020-09-20 DIAGNOSIS — E785 Hyperlipidemia, unspecified: Secondary | ICD-10-CM

## 2020-09-20 DIAGNOSIS — Z794 Long term (current) use of insulin: Secondary | ICD-10-CM | POA: Diagnosis not present

## 2020-09-20 DIAGNOSIS — E669 Obesity, unspecified: Secondary | ICD-10-CM | POA: Diagnosis not present

## 2020-09-20 DIAGNOSIS — E1165 Type 2 diabetes mellitus with hyperglycemia: Secondary | ICD-10-CM | POA: Diagnosis not present

## 2020-09-20 LAB — POCT GLYCOSYLATED HEMOGLOBIN (HGB A1C): Hemoglobin A1C: 6.8 % — AB (ref 4.0–5.6)

## 2020-09-20 LAB — LIPID PANEL
Cholesterol: 175 mg/dL (ref 0–200)
HDL: 71.5 mg/dL (ref >39.00)
LDL Cholesterol: 88 mg/dL (ref 0–99)
NonHDL: 103.45
Total CHOL/HDL Ratio: 2
Triglycerides: 76 mg/dL (ref 0.0–149.0)
VLDL: 15.2 mg/dL (ref 0.0–40.0)

## 2020-09-20 LAB — COMPREHENSIVE METABOLIC PANEL
ALT: 11 U/L (ref 0–35)
AST: 14 U/L (ref 0–37)
Albumin: 4.3 g/dL (ref 3.5–5.2)
Alkaline Phosphatase: 98 U/L (ref 39–117)
BUN: 13 mg/dL (ref 6–23)
CO2: 29 mEq/L (ref 19–32)
Calcium: 9.7 mg/dL (ref 8.4–10.5)
Chloride: 102 mEq/L (ref 96–112)
Creatinine, Ser: 0.7 mg/dL (ref 0.40–1.20)
GFR: 86.26 mL/min (ref >60.00)
Glucose, Bld: 85 mg/dL (ref 70–99)
Potassium: 4.2 mEq/L (ref 3.5–5.1)
Sodium: 139 mEq/L (ref 135–145)
Total Bilirubin: 0.4 mg/dL (ref 0.2–1.2)
Total Protein: 7 g/dL (ref 6.0–8.3)

## 2020-09-20 LAB — MICROALBUMIN / CREATININE URINE RATIO
Creatinine,U: 79.5 mg/dL
Microalb Creat Ratio: 1.1 mg/g (ref 0.0–30.0)
Microalb, Ur: 0.8 mg/dL (ref 0.0–1.9)

## 2020-09-20 NOTE — Patient Instructions (Addendum)
Please continue: - Metformin ER 1000 mg with breakfast - Ozempic 1 mg weekly - AOZ30 with 4-5 clicks before meal (and 1 click for correction at night - if absolutely needed). If you have a late dinner, please use 5-6 units. If sugars before meals <60, do not take Novolog before that meal. If sugars before meals <90, try to move the bolus right before the meal At bedtime, only correct sugars >865, and only 1 click of Novolog.  Please return in 3 months.

## 2020-09-20 NOTE — Progress Notes (Addendum)
Patient ID: Molly Wall, female   DOB: 20-Sep-1945, 75 y.o.   MRN: [id number]   This visit occurred during the SARS-CoV-2 public health emergency.  Safety protocols were in place, including screening questions prior to the visit, additional usage of staff PPE, and extensive cleaning of exam room while observing appropriate contact time as indicated for disinfecting solutions.   HPI: Molly Wall is a 75 y.o.-year-old female, initially referred by her PCP, Alma Friendly, MD, returning for follow-up for DM2, dx at 35 (2017), h/o reactive hypoglycemia, insulin-dependent, uncontrolled, without long-term complications.  Last visit 3 month ago.  Interim history: She went to a trip to [place] since last visit. She is preparing to go to [place] in 11/2020 and then [place] next year.  Her brother is a Engineer, technical sales. Since last visit, he has been doing well, without complaints. No increased urination, blurry vision, nausea, chest pain.  Reviewed HbA1c levels: Lab Results  Component Value Date   HGBA1C 8.1 (A) 06/21/2020   HGBA1C 8.7 (A) 03/19/2020   HGBA1C 8.2 (A) 09/07/2019   HGBA1C 9.4 (H) 06/06/2019   HGBA1C 9.1 (A) 02/08/2019   HGBA1C 9.6 (A) 10/27/2018   HGBA1C 8.2 (A) 07/30/2018   HGBA1C 11.1 (A) 04/27/2018   HGBA1C 11.3 (H) 12/09/2017   HGBA1C 6.8 (H) 04/10/2016   She is on: - Metformin ER 1000 mg with breakfast - Ozempic 0.5 >> 1 mg weekly  - VGo 30 4-5 >> 3-4 clicks per meal and 2-3 clicks >> 1 clicks for correction at night >>  MWN02 4-5 clicks per meal (she did not switch to VGo20 yet)  Previously on: - Lantus 35 >> 25 units in am >> 25 units in a.m. and 10 units at bedtime >> 20 units in a.m. and 10 units at night - Novolog 7-9 units before 15 min before meals >> B'fast: 6-8 units  Lunch: 10-11 units Dinner: 6-7 units If sugars before meals <60, do not take Novolog before that meal. If sugars before meals <90, take 50% of the Novolog before that meal At bedtime, only  correct sugars >250, and only with up to 5 units of Novolog.  She checks her sugars more than 4 times a day with her CGM:   Prev.:   Prev.:   Lowest sugar was  42 x1 >> 40s >> 45 (fingerstick) >> 50s (in [place]); she has hypoglycemia awareness in the 50s. Highest sugar was 591 >> 400s >> 500s >> 300s >> 300s.  Glucometer: One Touch Verio  -No CKD, last BUN/creatinine:  Lab Results  Component Value Date   BUN 21 01/06/2019   BUN 17 12/30/2018   CREATININE 0.82 01/06/2019   CREATININE 0.71 12/30/2018  Not on ACE inhibitor/ARB.  -No HL; last set of lipids: Lab Results  Component Value Date   CHOL 174 06/06/2019   HDL 74.50 06/06/2019   LDLCALC 83 06/06/2019   TRIG 84.0 06/06/2019   CHOLHDL 2 06/06/2019  On Lipitor 20.  - last eye exam was in 06/2019: No DR  -No numbness and tingling in her feet.  Pt has FH of DM in [place].  She has OSA and started a CPAP after which she felt much better.  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    She is on levothyroxine 88 mcg  1 tablet 6/7 days and 1.5 tablets 1/7 days.    Latest TSH was normal: Lab Results  Component Value Date   TSH 0.76 02/21/2020   She lives alone.  ROS: + See HPI  I reviewed pt's medications, allergies, PMH, social hx, family hx, and changes were documented in the history of present illness. Otherwise, unchanged from my initial visit note.  Past Medical History:  Diagnosis Date   Allergic rhinitis    Allergy    Asthma    Atypical ductal hyperplasia of right breast    Atypical ductal hyperplasia of right breast 73/29/2019   Breast cancer ([place])    Diabetes mellitus without complication ([place])    Family history of breast cancer    Family history of colon cancer    Hypercholesteremia    Hypothyroidism    Lumbar herniated disc     OSA (obstructive sleep apnea)    used to use CPAP, not any more over 1 1/2 yrs   Salivary stone    left side   Sleep apnea    no cpap in 2 yrs    Vitamin D deficiency    Past Surgical History:  Procedure Laterality Date   APPENDECTOMY     BREAST LUMPECTOMY WITH RADIOACTIVE SEED LOCALIZATION Right 08/12/2017   Procedure: RIGHT BREAST LUMPECTOMY X'S 2 WITH RADIOACTIVE SEED LOCALIZATION X'S 2;  Surgeon: Fanny Skates, MD;  Location: [facility];  Service: General;  Laterality: Right;   COLONOSCOPY     11 yrs ago in [place] but given a 10 yr recall per pt    GANGLION CYST EXCISION     75 years old   [place]  2014   [place]   TONSILLECTOMY     age 62   Social History   Socioeconomic History   Marital status: Divorced    Spouse name: Not on file   Number of children: 2   Years of education: Not on file   Highest education level: Not on file  Occupational History   Not on file  Tobacco Use   Smoking status: Former Smoker    Packs/day: 1.00    Years: 2.00    Pack years: 2.00    Types: Cigarettes   Smokeless tobacco: Never Used   Tobacco comment: quite smoker at age 77  Substance and Sexual Activity   Alcohol use: Yes    Alcohol/week: 0.0 - 1.0 standard drinks    Comment: 0-1 per month   Drug use: No   Sexual activity: Not Currently  Other Topics Concern   Not on file  Social History Narrative   Single.   Moved from [place] to [place].   Family lives in [place].   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   Social Determinants of Health   Financial Resource Strain:    Difficulty of Paying Living Expenses: Not on file  Food Insecurity:    Worried About Charity fundraiser in the Last Year: Not on file   [employer] of Food in the Last Year: Not on file  Transportation Needs:    Lack of Transportation  (Medical): Not on file   Lack of Transportation (Non-Medical): Not on file  Physical Activity:    Days of Exercise per Week: Not on file   Minutes of Exercise per Session: Not  on file  Stress:    Feeling of Stress : Not on file  Social Connections:    Frequency of Communication with Friends and Family: Not on file   Frequency of Social Gatherings with Friends and Family: Not on file   Attends Religious Services: Not on file   Active Member of Clubs or Organizations: Not on file   Attends Archivist Meetings: Not on file   Marital Status: Not on file  Intimate Partner Violence:    Fear of Current or Ex-Partner: Not on file   Emotionally Abused: Not on file   Physically Abused: Not on file   Sexually Abused: Not on file   Current Outpatient Medications on File Prior to Visit  Medication Sig Dispense Refill   albuterol (VENTOLIN HFA) 108 (90 Base) MCG/ACT inhaler INHALE 2 PUFFS BY MOUTH EVERY 6 HOURS AS NEEDED FOR WHEEZE OR SHORTNESS OF BREATH 8 g 0   anastrozole (ARIMIDEX) 1 MG tablet TAKE 1 TABLET BY MOUTH EVERY DAY 90 tablet 4   atorvastatin (LIPITOR) 20 MG tablet TAKE 1 TABLET BY MOUTH EVERY DAY FOR CHOLESTEROL 90 tablet 0   Continuous Blood Gluc Receiver (DEXCOM G6 RECEIVER) DEVI Use to monitor blood sugar 1 each 0   Continuous Blood Gluc Sensor (DEXCOM G6 SENSOR) MISC Use as instructed change sensor every 10 days E11.65 9 each 1   Continuous Blood Gluc Transmit (DEXCOM G6 TRANSMITTER) MISC 1 Device by Does not apply route every 3 (three) months. 1 each 3   fluticasone (FLONASE) 50 MCG/ACT nasal spray INSTILL 1 SPRAY IN EACH NOSTRIL TWICE A DAY AS NEEDED FOR ALLERGIES OR RHINITIS 48 mL 0   fluticasone-salmeterol (WIXELA INHUB) 250-50 MCG/ACT AEPB INHALE 1 PUFF BY MOUTH TWICE A DAY 60 each 2   insulin aspart (NOVOLOG) 100 UNIT/ML injection Use 66 units of insulin in the VGo pump daily. 30 mL PRN   Insulin Disposable Pump (V-GO 20) KIT Use 1x a day 90 kit 3   Insulin Pen  Needle 32G X 4 MM MISC Use 5-6x a day for insulin injections 500 each 3   levothyroxine (SYNTHROID) 88 MCG tablet TAKE 1 AND 1/2 TABLET BY MOUTH EVERY SUNDAY. TAKE 1 TABLET BY MOUTH MON THROUGH SAT. TAKE ON AN EMPTY STOMACH WITH WATER ONLY. NO FOOD OR OTHER MEDICATIONS FOR 30 MINUTES. 98 tablet 0   metFORMIN (GLUCOPHAGE-XR) 500 MG 24 hr tablet Take 2 tablets (1,000 mg total) by mouth daily with breakfast. For diabetes. 180 tablet 0   montelukast (SINGULAIR) 10 MG tablet TAKE 1 TABLET (10 MG TOTAL) BY MOUTH AT BEDTIME. FOR ALLERGIES AND ASTHMA. 90 tablet 1   Semaglutide,0.25 or 0.5MG/DOS, (OZEMPIC, 0.25 OR 0.5 MG/DOSE,) 2 MG/1.5ML SOPN Inject 0.5 mg into the skin once a week. 4.5 mL 3   No current facility-administered medications on file prior to visit.   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 She described it was a small amount in a milk carton in [place]. She states they stopped using it shortly after because the trial wasn't successful. SPM   Family History  Problem Relation Age of Onset   Alzheimer's disease Mother    Asthma Mother    Dementia Mother        cause of death   Arthritis Father    Heart attack Father 6       cause of death  Breast cancer Sister 41   Healthy Daughter    Colon cancer Paternal Grandmother    Liver cancer Maternal Uncle    Healthy Daughter    Healthy Brother    Healthy Sister    Colon polyps Neg Hx    Rectal cancer Neg Hx    Stomach cancer Neg Hx    PE: BP 128/80 (BP Location: Left Arm, Patient Position: Sitting, Cuff Size: Normal)   Pulse 75   Ht '5\' 4"'  (1.626 m)   Wt 194 lb 12.8 oz (88.4 kg)   SpO2 97%   BMI 33.44 kg/m  Wt Readings from Last 3 Encounters:  09/20/20 194 lb 12.8 oz (88.4 kg)  07/13/20 206 lb 12.8 oz (93.8 kg)  06/21/20 202 lb (91.6 kg)   Constitutional: overweight, in NAD Eyes: PERRLA, EOMI, no exophthalmos ENT:  moist mucous membranes, no thyromegaly, no cervical lymphadenopathy Cardiovascular: RRR, No MRG Respiratory: CTA B Gastrointestinal: abdomen soft, NT, ND, BS+ Musculoskeletal: no deformities, strength intact in all 4 Skin: moist, warm, no rashes Neurological: no tremor with outstretched hands, DTR normal in all 4  ASSESSMENT: 1. DM2, insulin-dependent, uncontrolled, without long-term complications, but with hyperglycemia  2. HL  3. Obesity class 1  PLAN:  1. Patient with longstanding, insulin-dependent diabetes, on basal-bolus insulin regimen and metformin, and weekly GLP-1 receptor agonist, with improved control after she started a plant-based diet, but still with fluctuating blood sugars.  We started with the V-Go patch pump earlier in the year and sugars improved, but she contacted me approximately 1 month ago with higher blood sugars.  We called her back to find out more information about them, but she did not respond. -Unfortunately, her Dexcom CGM was not approved even after we sent the preauthorization and an appeal. -At last visit, HbA1c was slightly lower, at 8.1%.  At that time, reviewing her CGM tracings, sugars were increasing throughout the day and then dropping abruptly after 2 AM with a nadir around 8-9 AM.  At that time I advised her to increase Ozempic to 1 mg and we also backed off from [place] to a VGo20 especially as she was planning a trip out of the country at that time. CGM interpretation: -At today's visit, we reviewed her CGM downloads: It appears that 59.8% of values are in target range (goal >70%), while 39.6% are higher than 180 (goal <25%), and 0.6% are lower than 70 (goal <4%).  The calculated average blood sugar is 174.  The projected HbA1c for the next 3 months (GMI) is 7.5%. -Reviewing the CGM trends, her sugars are significantly better compared to last visit.  They are not increasing as much after lunch that they did before.  However, there is definitely still a  trend of increasing blood sugars as the day goes by, a sign of not enough mealtime insulin.  Upon questioning, she is using usually 4 clicks and occasionally 5 clicks of insulin before meals, but only 2 clicks if the sugars are low before the meal.  She has been off and on metformin as she had the impression that this is lowering her blood sugars too much, but I advised that this is not the case, it just renders her more sensitive to the insulin.  Also, she did not have a good understanding of why her sugars are lower when she was more active, in fact, she felt that her sugar should increase with increased activity.  We discussed about these concepts and for now I only advised  her to increase her insulin with dinner, since her sugars increase the most after she had a late dinner.  I advised her that in the situations, she might need up to 6 units of NovoLog. -Otherwise, we will continue the current regimen.  - I suggested to:  Patient Instructions  Please continue: - Metformin ER 1000 mg with breakfast - Ozempic 1 mg weekly - [id number] with 4-5 clicks before meal (and 1 click for correction at night - if absolutely needed). If you have a late dinner, please use 5-6 units. If sugars before meals <60, do not take Novolog before that meal. If sugars before meals <90, try to move the bolus right before the meal At bedtime, only correct sugars >761, and only 1 click of Novolog.  Please return in 3 months.   - we checked her HbA1c: 6.8% (improved) - advised to check sugars at different times of the day - 4x a day, rotating check times - advised for yearly eye exams >> she is not UTD but will get this scheduled during the summer - will check annual labs today - return to clinic in 3 months  2. HL -Reviewed latest lipid panel from 05/2019: All fractions at goal: Lab Results  Component Value Date   CHOL 174 06/06/2019   HDL 74.50 06/06/2019   LDLCALC 83 06/06/2019   TRIG 84.0 06/06/2019   CHOLHDL 2  06/06/2019  -She continues on Lipitor 20 mg daily without side effects -She is due for another lipid panel-we will check today  3.  Obesity class I -She continues on Ozempic, which should also help with weight loss -She also continues on a more plant-based diet -She lost 12 pounds since last visit!  Component     Latest Ref Rng & Units 09/20/2020  Sodium     135 - 145 mEq/L 139  Potassium     3.5 - 5.1 mEq/L 4.2  Chloride     96 - 112 mEq/L 102  CO2     19 - 32 mEq/L 29  Glucose     70 - 99 mg/dL 85  BUN     6 - 23 mg/dL 13  Creatinine     0.40 - 1.20 mg/dL 0.70  Total Bilirubin     0.2 - 1.2 mg/dL 0.4  Alkaline Phosphatase     39 - 117 U/L 98  AST     0 - 37 U/L 14  ALT     0 - 35 U/L 11  Total Protein     6.0 - 8.3 g/dL 7.0  Albumin     3.5 - 5.2 g/dL 4.3  Calcium     8.4 - 10.5 mg/dL 9.7  GFR     >60.00 mL/min 86.26  Cholesterol     0 - 200 mg/dL 175  Triglycerides     0.0 - 149.0 mg/dL 76.0  HDL Cholesterol     >39.00 mg/dL 71.50  VLDL     0.0 - 40.0 mg/dL 15.2  LDL (calc)     0 - 99 mg/dL 88  Total CHOL/HDL Ratio      2  NonHDL      103.45  Microalb, Ur     0.0 - 1.9 mg/dL 0.8  Creatinine,U     mg/dL 79.5  MICROALB/CREAT RATIO     0.0 - 30.0 mg/g 1.1    Philemon Kingdom, MD PhD [facility] Endocrinology

## 2020-10-17 ENCOUNTER — Other Ambulatory Visit: Payer: Self-pay | Admitting: Internal Medicine

## 2020-10-18 ENCOUNTER — Other Ambulatory Visit: Payer: Self-pay | Admitting: Internal Medicine

## 2020-11-04 ENCOUNTER — Other Ambulatory Visit: Payer: Self-pay | Admitting: Internal Medicine

## 2020-11-06 ENCOUNTER — Telehealth: Payer: Self-pay | Admitting: Internal Medicine

## 2020-11-06 DIAGNOSIS — Z794 Long term (current) use of insulin: Secondary | ICD-10-CM

## 2020-11-06 DIAGNOSIS — E1165 Type 2 diabetes mellitus with hyperglycemia: Secondary | ICD-10-CM

## 2020-11-06 NOTE — Telephone Encounter (Signed)
Pt would like to speak to a nurse as well

## 2020-11-06 NOTE — Telephone Encounter (Signed)
Pt recently has gotten Dexom supplies through [employer]. Which pt no longer wants to use this company anymore  Pt is wanting to [facility].  She is needing the   Continuous Blood Gluc Receiver (DEXCOM G6 RECEIVER) DEVI Continuous Blood Gluc Sensor (DEXCOM G6 SENSOR) MISC Continuous Blood Gluc Transmit (DEXCOM G6 TRANSMITTER) MISC  Pt is needing these sent in a 3 month supply.    [id number] [place] [place] - [address]

## 2020-11-07 ENCOUNTER — Other Ambulatory Visit: Payer: Self-pay | Admitting: Primary Care

## 2020-11-07 DIAGNOSIS — E039 Hypothyroidism, unspecified: Secondary | ICD-10-CM

## 2020-11-07 MED ORDER — DEXCOM G6 RECEIVER DEVI: 0 refills | Status: DC

## 2020-11-07 MED ORDER — DEXCOM G6 TRANSMITTER MISC: 1.0000 | 2 refills | Status: DC

## 2020-11-07 MED ORDER — DEXCOM G6 SENSOR MISC: 1 refills | Status: DC

## 2020-11-07 NOTE — Addendum Note (Signed)
Addended by: Lauralyn Primes on: 11/07/2020 08:56 AM   Modules accepted: Orders

## 2020-11-07 NOTE — Telephone Encounter (Signed)
Called and advised pt Rx sent to preferred pharmacy.

## 2020-11-07 NOTE — Telephone Encounter (Signed)
Patient called re: [id number] [place] [place]  told Patient they sent request for Diagnosis Code and Medicare Part B forms required by Medicare in order for Patient to receive the following RX's:  Continuous Blood Gluc Receiver (DEXCOM G6 RECEIVER) DEVI  Continuous Blood Gluc Sensor (DEXCOM G6 SENSOR) MISC  Continuous Blood Gluc Transmit (DEXCOM G6 TRANSMITTER) MISC  Patient states she is unable to receive the above RX's until the [facility] request paperwork listed above is filled out and [facility].  Patient is going [place] in 2 weeks and if the above request is not completed in a timely manner, [place] and requests the above be done asap so that does not happen.  If questions please contact Patient at ph# [phone]

## 2020-11-08 ENCOUNTER — Other Ambulatory Visit: Payer: Self-pay | Admitting: Internal Medicine

## 2020-11-08 DIAGNOSIS — E1165 Type 2 diabetes mellitus with hyperglycemia: Secondary | ICD-10-CM

## 2020-11-09 NOTE — Telephone Encounter (Signed)
Pt is going out of the country and needs help as soon as possible

## 2020-11-09 NOTE — Telephone Encounter (Signed)
Pt calling voiced that [facility] is requiring  diagnosis code for  paperwork for medicare and also some papers that has been faxed to [place]. This needs to be for a 90 day supply for the sensors.

## 2020-11-11 ENCOUNTER — Other Ambulatory Visit: Payer: Self-pay | Admitting: Primary Care

## 2020-11-11 DIAGNOSIS — E785 Hyperlipidemia, unspecified: Secondary | ICD-10-CM

## 2020-11-12 NOTE — Telephone Encounter (Signed)
Diabetic educator made aware of situation and is assisting

## 2020-11-12 NOTE — Telephone Encounter (Signed)
I spoke with the pharmacist.  She is needing to bill this under part B Medicare (durable medical equipment and they are requiring that we send a script for reader and a 3 month supply of sensors. (9), and 1 transmitter.  They are refaxing the paperwork to  [phone] today.  I am leaving at 2.  I will call the patient to update her.

## 2020-11-12 NOTE — Telephone Encounter (Signed)
Patient contacted and told her I spoke with pharmacist and had her refax the paperwork for the Dexcom.  This must be filed under part B medicare, which requires a reader, a 3 month supply of sensors and a transmitter.   She was very Patent attorney.

## 2020-11-13 NOTE — Telephone Encounter (Signed)
Inbound fax requesting recent clinical notes. Forms faxed to [phone].

## 2020-11-14 NOTE — Telephone Encounter (Signed)
Called and spoke with pharmacy. Rx was processed and pt to be contacted by pharmacy to get refund on previously purchased dexcom sensors.

## 2020-11-15 ENCOUNTER — Telehealth: Payer: Self-pay | Admitting: Dietician

## 2020-11-15 NOTE — Telephone Encounter (Signed)
Patient left message to return her call related to [facility] coverage issues.  Returned call.  Patient was not available and left voice mail that a message will be left for Avigail who will return her call on next week.  Antonieta Iba, RD, LDN, CDCES

## 2020-11-16 ENCOUNTER — Other Ambulatory Visit: Payer: Self-pay

## 2020-11-16 ENCOUNTER — Ambulatory Visit (INDEPENDENT_AMBULATORY_CARE_PROVIDER_SITE_OTHER): Payer: Medicare Other | Admitting: Primary Care

## 2020-11-16 VITALS — BP 120/76 | HR 97 | Temp 97.6°F | Ht 64.0 in | Wt 191.0 lb

## 2020-11-16 DIAGNOSIS — Z794 Long term (current) use of insulin: Secondary | ICD-10-CM

## 2020-11-16 DIAGNOSIS — J4520 Mild intermittent asthma, uncomplicated: Secondary | ICD-10-CM

## 2020-11-16 DIAGNOSIS — E785 Hyperlipidemia, unspecified: Secondary | ICD-10-CM

## 2020-11-16 DIAGNOSIS — Z1159 Encounter for screening for other viral diseases: Secondary | ICD-10-CM

## 2020-11-16 DIAGNOSIS — E1165 Type 2 diabetes mellitus with hyperglycemia: Secondary | ICD-10-CM | POA: Diagnosis not present

## 2020-11-16 DIAGNOSIS — E039 Hypothyroidism, unspecified: Secondary | ICD-10-CM

## 2020-11-16 DIAGNOSIS — J302 Other seasonal allergic rhinitis: Secondary | ICD-10-CM | POA: Diagnosis not present

## 2020-11-16 DIAGNOSIS — N6091 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: Secondary | ICD-10-CM

## 2020-11-16 DIAGNOSIS — J019 Acute sinusitis, unspecified: Secondary | ICD-10-CM

## 2020-11-16 MED ORDER — AMOXICILLIN-POT CLAVULANATE 875-125 MG PO TABS: 1.0000 | ORAL_TABLET | Freq: Two times a day (BID) | ORAL | 0 refills | Status: DC
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
## 2020-11-16 NOTE — Assessment & Plan Note (Signed)
Well controlled.   Continue Wixela daily seasonally. Continue PRN albuterol.  Continue Singulair 10 mg.   No wheezing on exam.

## 2020-11-16 NOTE — Patient Instructions (Signed)
Stop by the lab prior to leaving today. I will notify you of your results once received.   Start Augmentin antibiotics for the infection Take 1 tablet by mouth twice daily for 7 days.  It was a pleasure to see you today!

## 2020-11-16 NOTE — Assessment & Plan Note (Signed)
Compliant to anastrozole 1 mg daily, continue same.  Following with oncology. Mammogram UTD, due for MR breasts this year.

## 2020-11-16 NOTE — Assessment & Plan Note (Signed)
Four weeks of symptoms, no improvement. She is stable today.  Trial of Augmentin course sent to pharmacy, BID x 7 days. She will update if no improvement.

## 2020-11-16 NOTE — Assessment & Plan Note (Signed)
Doing well on Singulair 10 mg daily, and PRN Flonase, continue same.

## 2020-11-16 NOTE — Assessment & Plan Note (Signed)
She is taking levothyroxine 88 mcg correctly.   Continue 1.5 tablets once weekly, continue 1 tablet 6 days weekly.   Repeat TSH pending.

## 2020-11-16 NOTE — Assessment & Plan Note (Signed)
Compliant to atorvastatin 20 mg, lipid panel reviewed from July 2022. Continue current regimen.

## 2020-11-16 NOTE — Assessment & Plan Note (Addendum)
Well controlled in July 2022, follows with endocrinology,  A1C of 6.8 in July 2022. Last office visit notes reviewed.  Continue Insulin pump, Metformin XR 1000 mg daily, Ozempic 0.5 mg weekly.

## 2020-11-20 DIAGNOSIS — J019 Acute sinusitis, unspecified: Secondary | ICD-10-CM

## 2020-11-20 DIAGNOSIS — E1165 Type 2 diabetes mellitus with hyperglycemia: Secondary | ICD-10-CM

## 2020-11-20 LAB — TSH: TSH: 0.97 mIU/L (ref 0.40–4.50)

## 2020-11-20 LAB — HEPATITIS C ANTIBODY
Hepatitis C Ab: NONREACTIVE
SIGNAL TO CUT-OFF: 0.01 (ref <1.00)

## 2020-11-20 MED ORDER — AMOXICILLIN-POT CLAVULANATE 875-125 MG PO TABS: 1.0000 | ORAL_TABLET | Freq: Two times a day (BID) | ORAL | 0 refills | Status: DC

## 2020-11-21 NOTE — Telephone Encounter (Signed)
Please call [facility] and cancel Augmentin Rx.

## 2020-11-23 NOTE — Telephone Encounter (Signed)
Have called and canceled that prescription

## 2020-11-29 ENCOUNTER — Ambulatory Visit: Payer: BLUE CROSS/BLUE SHIELD | Admitting: Rheumatology
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
## 2020-12-07 ENCOUNTER — Other Ambulatory Visit: Payer: Self-pay | Admitting: Primary Care

## 2020-12-07 DIAGNOSIS — E785 Hyperlipidemia, unspecified: Secondary | ICD-10-CM

## 2020-12-07 DIAGNOSIS — E039 Hypothyroidism, unspecified: Secondary | ICD-10-CM

## 2020-12-09 ENCOUNTER — Other Ambulatory Visit: Payer: Self-pay | Admitting: Primary Care

## 2020-12-09 DIAGNOSIS — J4520 Mild intermittent asthma, uncomplicated: Secondary | ICD-10-CM

## 2020-12-10 ENCOUNTER — Other Ambulatory Visit: Payer: Self-pay | Admitting: Primary Care

## 2020-12-10 DIAGNOSIS — Z794 Long term (current) use of insulin: Secondary | ICD-10-CM

## 2020-12-10 NOTE — Telephone Encounter (Signed)
Patient reports that her family doctor has and will continue to order her Dexcoms from [facility] and does not need anything from [place] at this time.

## 2020-12-11 MED ORDER — DEXCOM G6 TRANSMITTER MISC: 1.0000 | 0 refills | Status: DC

## 2020-12-11 MED ORDER — DEXCOM G6 RECEIVER DEVI: 0 refills | Status: DC

## 2020-12-11 MED ORDER — DEXCOM G6 SENSOR MISC: 1 refills | Status: DC

## 2020-12-13 ENCOUNTER — Ambulatory Visit (INDEPENDENT_AMBULATORY_CARE_PROVIDER_SITE_OTHER): Payer: Medicare Other | Admitting: Rheumatology

## 2020-12-13 ENCOUNTER — Other Ambulatory Visit: Payer: Self-pay

## 2020-12-13 ENCOUNTER — Encounter: Payer: Self-pay | Admitting: Rheumatology

## 2020-12-13 VITALS — BP 138/85 | HR 80 | Ht 64.0 in | Wt 190.8 lb

## 2020-12-13 DIAGNOSIS — M47816 Spondylosis without myelopathy or radiculopathy, lumbar region: Secondary | ICD-10-CM

## 2020-12-13 DIAGNOSIS — R768 Other specified abnormal immunological findings in serum: Secondary | ICD-10-CM | POA: Diagnosis not present

## 2020-12-13 DIAGNOSIS — N6091 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: Secondary | ICD-10-CM

## 2020-12-13 DIAGNOSIS — M19041 Primary osteoarthritis, right hand: Secondary | ICD-10-CM

## 2020-12-13 DIAGNOSIS — Z8639 Personal history of other endocrine, nutritional and metabolic disease: Secondary | ICD-10-CM

## 2020-12-13 DIAGNOSIS — G894 Chronic pain syndrome: Secondary | ICD-10-CM

## 2020-12-13 DIAGNOSIS — Q667 Congenital pes cavus, unspecified foot: Secondary | ICD-10-CM

## 2020-12-13 DIAGNOSIS — E1165 Type 2 diabetes mellitus with hyperglycemia: Secondary | ICD-10-CM

## 2020-12-13 DIAGNOSIS — M25561 Pain in right knee: Secondary | ICD-10-CM

## 2020-12-13 DIAGNOSIS — Z794 Long term (current) use of insulin: Secondary | ICD-10-CM

## 2020-12-13 DIAGNOSIS — M65311 Trigger thumb, right thumb: Secondary | ICD-10-CM

## 2020-12-13 DIAGNOSIS — M19042 Primary osteoarthritis, left hand: Secondary | ICD-10-CM

## 2020-12-13 DIAGNOSIS — Z800 Family history of malignant neoplasm of digestive organs: Secondary | ICD-10-CM

## 2020-12-13 DIAGNOSIS — M4310 Spondylolisthesis, site unspecified: Secondary | ICD-10-CM

## 2020-12-13 DIAGNOSIS — M25562 Pain in left knee: Secondary | ICD-10-CM

## 2020-12-13 DIAGNOSIS — E559 Vitamin D deficiency, unspecified: Secondary | ICD-10-CM

## 2020-12-13 DIAGNOSIS — J4521 Mild intermittent asthma with (acute) exacerbation: Secondary | ICD-10-CM

## 2020-12-13 DIAGNOSIS — M65312 Trigger thumb, left thumb: Secondary | ICD-10-CM

## 2020-12-13 DIAGNOSIS — Z803 Family history of malignant neoplasm of breast: Secondary | ICD-10-CM

## 2020-12-13 NOTE — Patient Instructions (Signed)
Knee Exercises Ask your health care provider which exercises are safe for you. Do exercises exactly as told by your health care provider and adjust them as directed. It is normal to feel mild stretching, pulling, tightness, or discomfort as you do these exercises. Stop right away if you feel sudden pain or your pain gets worse. Do not begin these exercises until told by your health care provider. Stretching and range-of-motion exercises These exercises warm up your muscles and joints and improve the movement and flexibility of your knee. These exercises also help to relieve pain and swelling. Knee extension, prone Lie on your abdomen (prone position) on a bed. Place your left / right knee just beyond the edge of the surface so your knee is not on the bed. You can put a towel under your left / right thigh just above your kneecap for comfort. Relax your leg muscles and allow gravity to straighten your knee (extension). You should feel a stretch behind your left / right knee. Hold this position for __________ seconds. Scoot up so your knee is supported between repetitions. Repeat __________ times. Complete this exercise __________ times a day. Knee flexion, active  Lie on your back with both legs straight. If this causes back discomfort, bend your left / right knee so your foot is flat on the floor. Slowly slide your left / right heel back toward your buttocks. Stop when you feel a gentle stretch in the front of your knee or thigh (flexion). Hold this position for __________ seconds. Slowly slide your left / right heel back to the starting position. Repeat __________ times. Complete this exercise __________ times a day. Quadriceps stretch, prone  Lie on your abdomen on a firm surface, such as a bed or padded floor. Bend your left / right knee and hold your ankle. If you cannot reach your ankle or pant leg, loop a belt around your foot and grab the belt instead. Gently pull your heel toward your  buttocks. Your knee should not slide out to the side. You should feel a stretch in the front of your thigh and knee (quadriceps). Hold this position for __________ seconds. Repeat __________ times. Complete this exercise __________ times a day. Hamstring, supine Lie on your back (supine position). Loop a belt or towel over the ball of your left / right foot. The ball of your foot is on the walking surface, right under your toes. Straighten your left / right knee and slowly pull on the belt to raise your leg until you feel a gentle stretch behind your knee (hamstring). Do not let your knee bend while you do this. Keep your other leg flat on the floor. Hold this position for __________ seconds. Repeat __________ times. Complete this exercise __________ times a day. Strengthening exercises These exercises build strength and endurance in your knee. Endurance is the ability to use your muscles for a long time, even after they get tired. Quadriceps, isometric This exercise stretches the muscles in front of your thigh (quadriceps) without moving your knee joint (isometric). Lie on your back with your left / right leg extended and your other knee bent. Put a rolled towel or small pillow under your knee if told by your health care provider. Slowly tense the muscles in the front of your left / right thigh. You should see your kneecap slide up toward your hip or see increased dimpling just above the knee. This motion will push the back of the knee toward the floor. For __________   seconds, hold the muscle as tight as you can without increasing your pain. Relax the muscles slowly and completely. Repeat __________ times. Complete this exercise __________ times a day. Straight leg raises This exercise stretches the muscles in front of your thigh (quadriceps) and the muscles that move your hips (hip flexors). Lie on your back with your left / right leg extended and your other knee bent. Tense the muscles in  the front of your left / right thigh. You should see your kneecap slide up or see increased dimpling just above the knee. Your thigh may even shake a bit. Keep these muscles tight as you raise your leg 4-6 inches (10-15 cm) off the floor. Do not let your knee bend. Hold this position for __________ seconds. Keep these muscles tense as you lower your leg. Relax your muscles slowly and completely after each repetition. Repeat __________ times. Complete this exercise __________ times a day. Hamstring, isometric Lie on your back on a firm surface. Bend your left / right knee about __________ degrees. Dig your left / right heel into the surface as if you are trying to pull it toward your buttocks. Tighten the muscles in the back of your thighs (hamstring) to "dig" as hard as you can without increasing any pain. Hold this position for __________ seconds. Release the tension gradually and allow your muscles to relax completely for __________ seconds after each repetition. Repeat __________ times. Complete this exercise __________ times a day. Hamstring curls If told by your health care provider, do this exercise while wearing ankle weights. Begin with __________ lb weights. Then increase the weight by 1 lb (0.5 kg) increments. Do not wear ankle weights that are more than __________ lb. Lie on your abdomen with your legs straight. Bend your left / right knee as far as you can without feeling pain. Keep your hips flat against the floor. Hold this position for __________ seconds. Slowly lower your leg to the starting position. Repeat __________ times. Complete this exercise __________ times a day. Squats This exercise strengthens the muscles in front of your thigh and knee (quadriceps). Stand in front of a table, with your feet and knees pointing straight ahead. You may rest your hands on the table for balance but not for support. Slowly bend your knees and lower your hips like you are going to sit in a  chair. Keep your weight over your heels, not over your toes. Keep your lower legs upright so they are parallel with the table legs. Do not let your hips go lower than your knees. Do not bend lower than told by your health care provider. If your knee pain increases, do not bend as low. Hold the squat position for __________ seconds. Slowly push with your legs to return to standing. Do not use your hands to pull yourself to standing. Repeat __________ times. Complete this exercise __________ times a day. Wall slides This exercise strengthens the muscles in front of your thigh and knee (quadriceps). Lean your back against a smooth wall or door, and walk your feet out 18-24 inches (46-61 cm) from it. Place your feet hip-width apart. Slowly slide down the wall or door until your knees bend __________ degrees. Keep your knees over your heels, not over your toes. Keep your knees in line with your hips. Hold this position for __________ seconds. Repeat __________ times. Complete this exercise __________ times a day. Straight leg raises This exercise strengthens the muscles that rotate the leg at the hip and   move it away from your body (hip abductors). Lie on your side with your left / right leg in the top position. Lie so your head, shoulder, knee, and hip line up. You may bend your bottom knee to help you keep your balance. Roll your hips slightly forward so your hips are stacked directly over each other and your left / right knee is facing forward. Leading with your heel, lift your top leg 4-6 inches (10-15 cm). You should feel the muscles in your outer hip lifting. Do not let your foot drift forward. Do not let your knee roll toward the ceiling. Hold this position for __________ seconds. Slowly return your leg to the starting position. Let your muscles relax completely after each repetition. Repeat __________ times. Complete this exercise __________ times a day. Straight leg raises This  exercise stretches the muscles that move your hips away from the front of the pelvis (hip extensors). Lie on your abdomen on a firm surface. You can put a pillow under your hips if that is more comfortable. Tense the muscles in your buttocks and lift your left / right leg about 4-6 inches (10-15 cm). Keep your knee straight as you lift your leg. Hold this position for __________ seconds. Slowly lower your leg to the starting position. Let your leg relax completely after each repetition. Repeat __________ times. Complete this exercise __________ times a day. This information is not intended to replace advice given to you by your health care provider. Make sure you discuss any questions you have with your health care provider. Document Revised: 12/22/2017 Document Reviewed: 12/22/2017 [employer] Patient Education  [place] Exercises Hand exercises can be helpful for almost anyone. These exercises can strengthen the hands, improve flexibility and movement, and increase blood flow to the hands. These results can make work and daily tasks easier. Hand exercises can be especially helpful for people who have joint pain from arthritis or have nerve damage from overuse (carpal tunnel syndrome). These exercises can also help people who have injured a hand. Exercises Most of these hand exercises are gentle stretching and motion exercises. It is usually safe to do them often throughout the day. Warming up your hands before exercise may help to reduce stiffness. You can do this with gentle massage or by placing your hands in warm water for 10-15 minutes. It is normal to feel some stretching, pulling, tightness, or mild discomfort as you begin new exercises. This will gradually improve. Stop an exercise right away if you feel sudden, severe pain or your pain gets worse. Ask your health care provider which exercises are best for you. Knuckle bend or "claw" fist  Stand or sit with your arm, hand,  and all five fingers pointed straight up. Make sure to keep your wrist straight during the exercise. Gently bend your fingers down toward your palm until the tips of your fingers are touching the top of your palm. Keep your big knuckle straight and just bend the small knuckles in your fingers. Hold this position for __________ seconds. Straighten (extend) your fingers back to the starting position. Repeat this exercise 5-10 times with each hand. Full finger fist  Stand or sit with your arm, hand, and all five fingers pointed straight up. Make sure to keep your wrist straight during the exercise. Gently bend your fingers into your palm until the tips of your fingers are touching the middle of your palm. Hold this position for __________ seconds. Extend your fingers back to the  starting position, stretching every joint fully. Repeat this exercise 5-10 times with each hand. Straight fist Stand or sit with your arm, hand, and all five fingers pointed straight up. Make sure to keep your wrist straight during the exercise. Gently bend your fingers at the big knuckle, where your fingers meet your hand, and the middle knuckle. Keep the knuckle at the tips of your fingers straight and try to touch the bottom of your palm. Hold this position for __________ seconds. Extend your fingers back to the starting position, stretching every joint fully. Repeat this exercise 5-10 times with each hand. Tabletop  Stand or sit with your arm, hand, and all five fingers pointed straight up. Make sure to keep your wrist straight during the exercise. Gently bend your fingers at the big knuckle, where your fingers meet your hand, as far down as you can while keeping the small knuckles in your fingers straight. Think of forming a tabletop with your fingers. Hold this position for __________ seconds. Extend your fingers back to the starting position, stretching every joint fully. Repeat this exercise 5-10 times with each  hand. Finger spread  Place your hand flat on a table with your palm facing down. Make sure your wrist stays straight as you do this exercise. Spread your fingers and thumb apart from each other as far as you can until you feel a gentle stretch. Hold this position for __________ seconds. Bring your fingers and thumb tight together again. Hold this position for __________ seconds. Repeat this exercise 5-10 times with each hand. Making circles  Stand or sit with your arm, hand, and all five fingers pointed straight up. Make sure to keep your wrist straight during the exercise. Make a circle by touching the tip of your thumb to the tip of your index finger. Hold for __________ seconds. Then open your hand wide. Repeat this motion with your thumb and each finger on your hand. Repeat this exercise 5-10 times with each hand. Thumb motion  Sit with your forearm resting on a table and your wrist straight. Your thumb should be facing up toward the ceiling. Keep your fingers relaxed as you move your thumb. Lift your thumb up as high as you can toward the ceiling. Hold for __________ seconds. Bend your thumb across your palm as far as you can, reaching the tip of your thumb for the small finger (pinkie) side of your palm. Hold for __________ seconds. Repeat this exercise 5-10 times with each hand. Grip strengthening  Hold a stress ball or other soft ball in the middle of your hand. Slowly increase the pressure, squeezing the ball as much as you can without causing pain. Think of bringing the tips of your fingers into the middle of your palm. All of your finger joints should bend when doing this exercise. Hold your squeeze for __________ seconds, then relax. Repeat this exercise 5-10 times with each hand. Contact a health care provider if: Your hand pain or discomfort gets much worse when you do an exercise. Your hand pain or discomfort does not improve within 2 hours after you exercise. If you have  any of these problems, stop doing these exercises right away. Do not do them again unless your health care provider says that you can. Get help right away if: You develop sudden, severe hand pain or swelling. If this happens, stop doing these exercises right away. Do not do them again unless your health care provider says that you can. This information is  not intended to replace advice given to you by your health care provider. Make sure you discuss any questions you have with your health care provider. Document Revised: 06/21/2020 Document Reviewed: 06/21/2020 Elsevier Patient Education  [place].

## 2020-12-19 IMAGING — MG DIGITAL DIAGNOSTIC BILATERAL MAMMOGRAM WITH TOMO AND CAD
8 series · 8 of 24 positions shown · non-contrast
Comparison: Previous exam(s).

CLINICAL DATA: 72-year-old female status post right breast
excisional biopsy for atypia and benign MRI guided right breast
biopsy. Patient is high risk for breast cancer.

EXAM:
DIGITAL DIAGNOSTIC BILATERAL MAMMOGRAM WITH CAD AND TOMO

[R CC synth-2D]
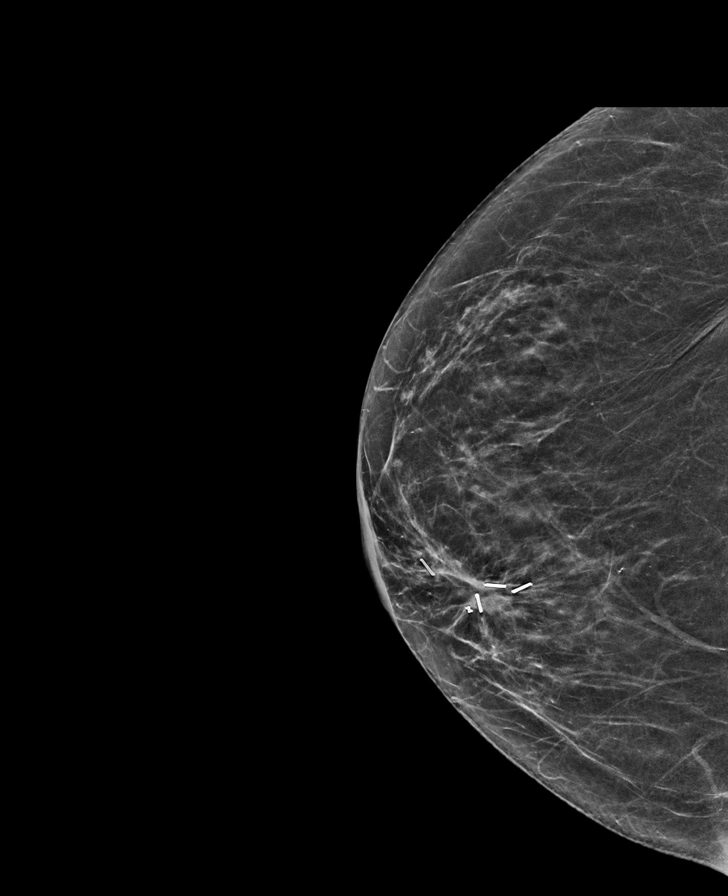

[L CC synth-2D]
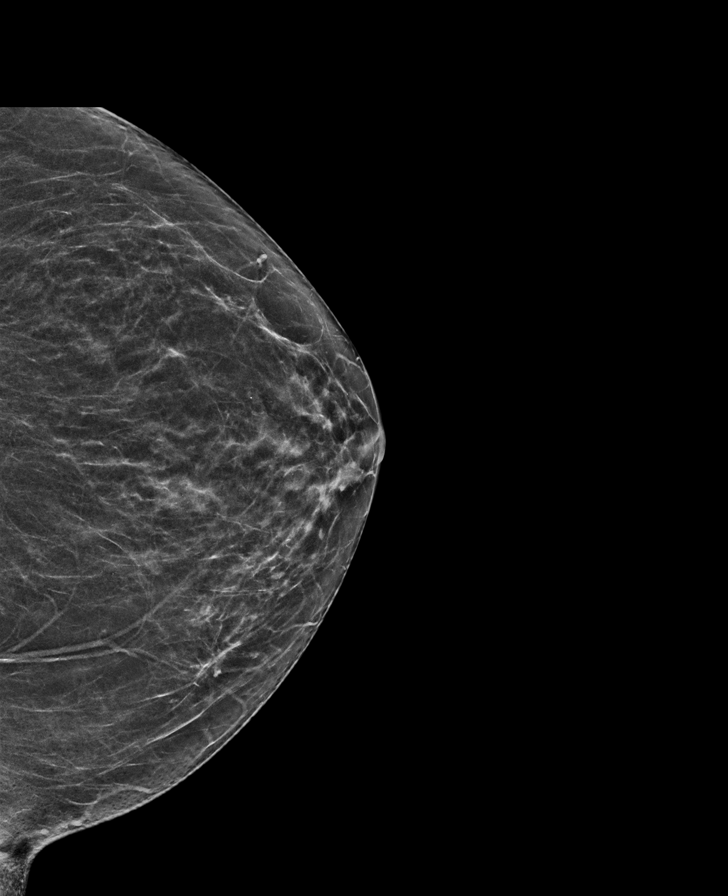

[L MLO synth-2D]
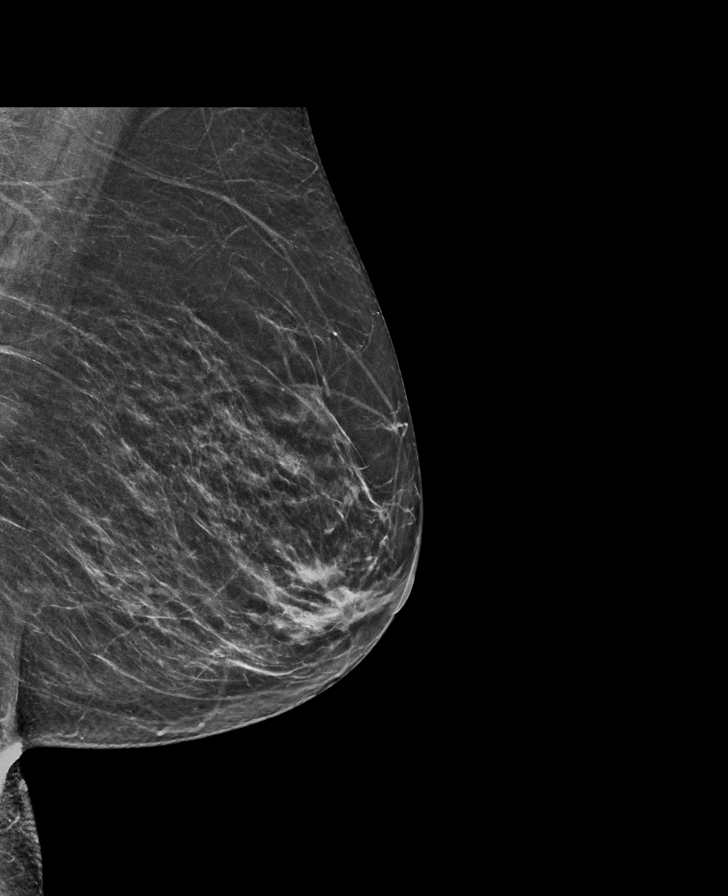

[R MLO synth-2D]
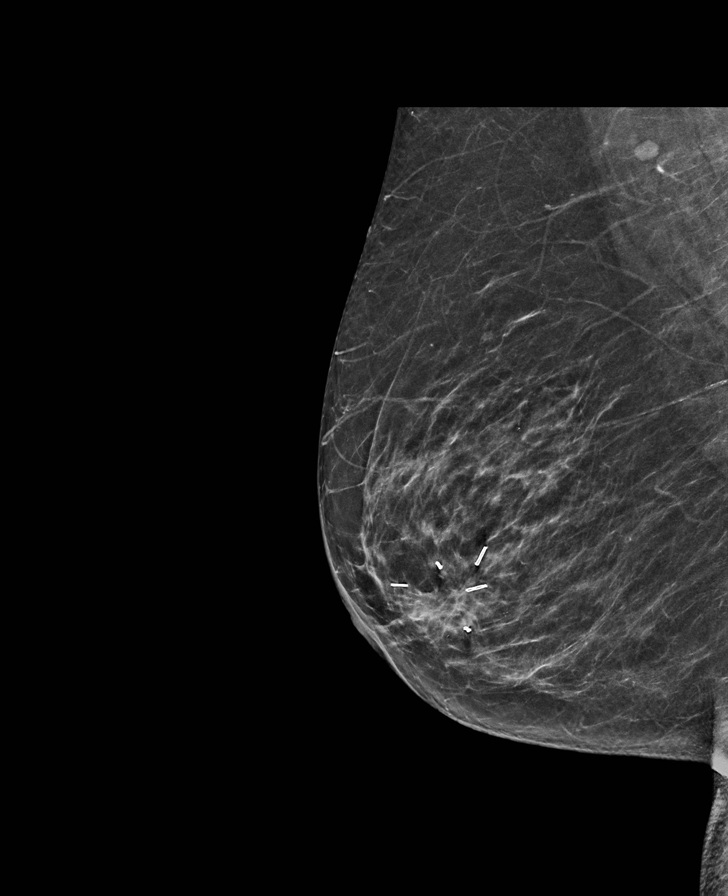

[L MLO tomo · tomo slice 33/65.0]
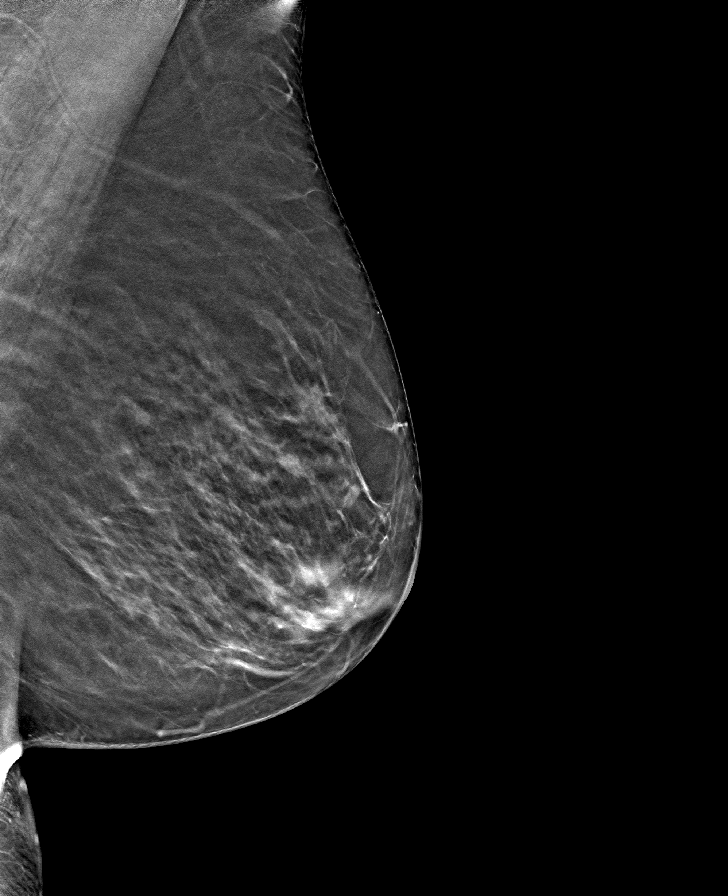

[R MLO tomo · tomo slice 35/68.0]
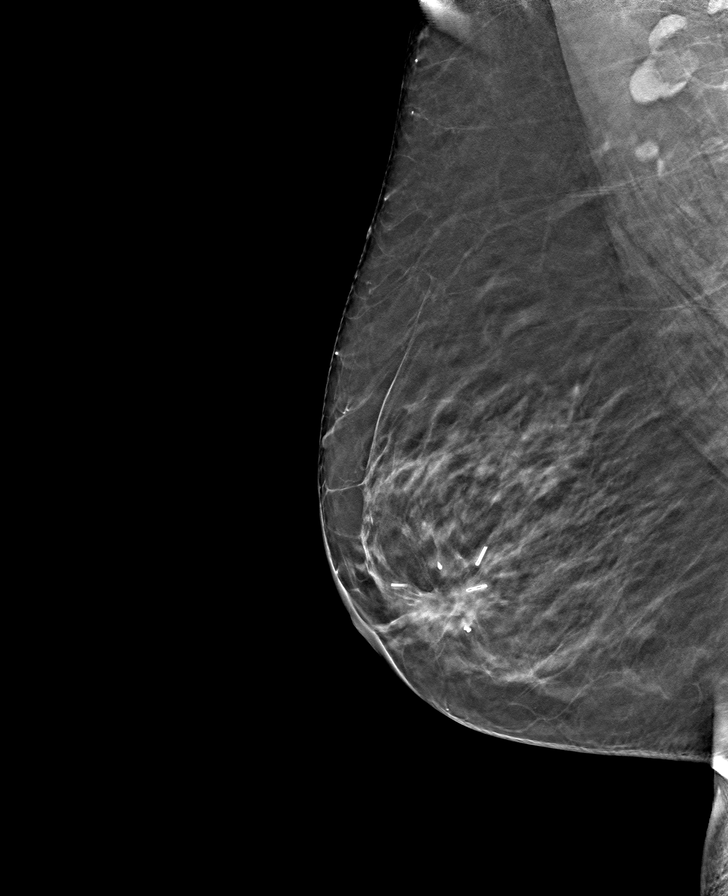

[R CC tomo · tomo slice 32/63.0]
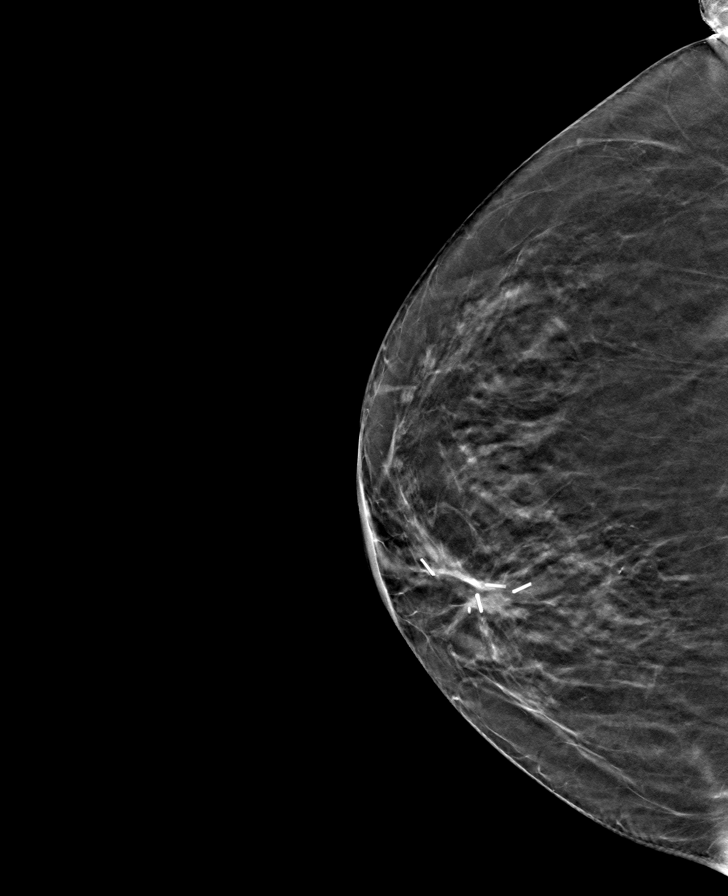

[L CC tomo · tomo slice 31/61.0]
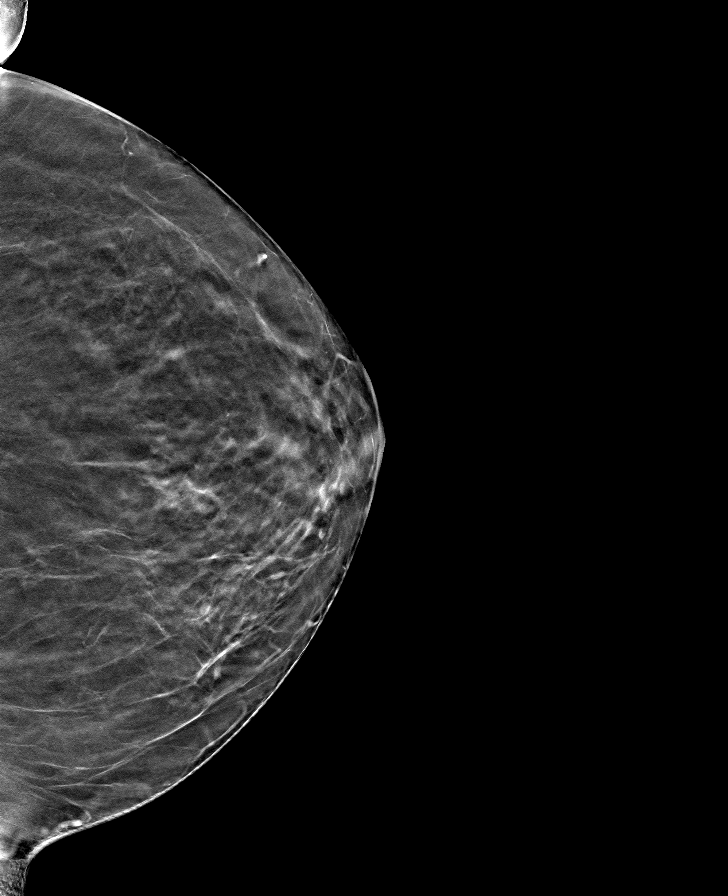

[8 of 24 positions shown; findings below may reference images not displayed]

ACR Breast Density Category b: There are scattered areas of
fibroglandular density.
FINDINGS: Postoperative changes are again noted in the subareolar right
breast. No new or suspicious findings are identified in either
breast. The parenchymal pattern is stable.

Mammographic images were processed with CAD.
IMPRESSION: No mammographic evidence of malignancy.

RECOMMENDATION:
1. Recommendation stands for follow-up breast MRI in July 2018 as
recommended on study dated 02/23/2018.
[DATE].  Screening mammogram in one year.(Code:[id number])

I have discussed the findings and recommendations with the patient.
Results were also provided in writing at the conclusion of the
visit. If applicable, a reminder letter will be sent to the patient
regarding the next appointment.

BI-RADS CATEGORY  2: Benign.

## 2020-12-22 ENCOUNTER — Encounter: Payer: Self-pay | Admitting: Internal Medicine

## 2020-12-26 NOTE — Telephone Encounter (Signed)
Should this request go to Endocrinology? Or are you handling her testing supplies? I see where you sent some in last month.

## 2021-01-01 ENCOUNTER — Ambulatory Visit (INDEPENDENT_AMBULATORY_CARE_PROVIDER_SITE_OTHER): Payer: Medicare Other | Admitting: Internal Medicine

## 2021-01-01 ENCOUNTER — Encounter: Payer: Self-pay | Admitting: Internal Medicine

## 2021-01-01 ENCOUNTER — Other Ambulatory Visit: Payer: Self-pay

## 2021-01-01 VITALS — BP 120/68 | HR 81 | Ht 64.0 in | Wt 189.6 lb

## 2021-01-01 DIAGNOSIS — Z794 Long term (current) use of insulin: Secondary | ICD-10-CM

## 2021-01-01 DIAGNOSIS — E66811 Obesity, class 1: Secondary | ICD-10-CM

## 2021-01-01 DIAGNOSIS — E669 Obesity, unspecified: Secondary | ICD-10-CM | POA: Diagnosis not present

## 2021-01-01 DIAGNOSIS — E785 Hyperlipidemia, unspecified: Secondary | ICD-10-CM

## 2021-01-01 DIAGNOSIS — E1165 Type 2 diabetes mellitus with hyperglycemia: Secondary | ICD-10-CM

## 2021-01-01 LAB — POCT GLYCOSYLATED HEMOGLOBIN (HGB A1C): Hemoglobin A1C: 7.1 % — AB (ref 4.0–5.6)

## 2021-01-01 NOTE — Progress Notes (Signed)
Patient ID: Molly Wall, female   DOB: 1946/01/16, 75 y.o.   MRN: [id number]   This visit occurred during the SARS-CoV-2 public health emergency.  Safety protocols were in place, including screening questions prior to the visit, additional usage of staff PPE, and extensive cleaning of exam room while observing appropriate contact time as indicated for disinfecting solutions.   HPI: Molly Wall is a 75 y.o.-year-old female, initially referred by her PCP, Molly Friendly, MD, returning for follow-up for DM2, dx at 89 (2017), h/o reactive hypoglycemia, insulin-dependent, uncontrolled, without long-term complications.  Last visit 3  month ago.  Interim history: She went to a trip to [place] since last visit. She is preparing to go to [place] in 11/2020 and then [place] next year.  Her brother is a Engineer, technical sales. No increased urination, blurry vision, nausea, chest pain. She just started Diabetic Doterra essential oils capsules Lyumjev in the last 2 days >> sugars improved.  Reviewed HbA1c levels: Lab Results  Component Value Date   HGBA1C 6.8 (A) 09/20/2020   HGBA1C 8.1 (A) 06/21/2020   HGBA1C 8.7 (A) 03/19/2020   HGBA1C 8.2 (A) 09/07/2019   HGBA1C 9.4 (H) 06/06/2019   HGBA1C 9.1 (A) 02/08/2019   HGBA1C 9.6 (A) 10/27/2018   HGBA1C 8.2 (A) 07/30/2018   HGBA1C 11.1 (A) 04/27/2018   HGBA1C 11.3 (H) 12/09/2017   She is on: - Metformin ER 1000 mg with breakfast - Ozempic 0.5 >> 1 mg weekly  - VGo 30 4-5 >> 3-4 clicks per meal and 2-3 clicks >> 1 clicks for correction at night >>  [id number] 4-5 clicks per meal but 5-6 clicks for a late dinner  Previously on: - Lantus 35 >> 25 units in am >> 25 units in a.m. and 10 units at bedtime >> 20 units in a.m. and 10 units at night - Novolog 7-9 units before 15 min before meals >> B'fast: 6-8 units  Lunch: 10-11 units Dinner: 6-7 units If sugars before meals <60, do not take Novolog before that meal. If sugars before meals <90,  take 50% of the Novolog before that meal At bedtime, only correct sugars >250, and only with up to 5 units of Novolog.  She checks her sugars more than 4 times a day with her CGM:   Prev.:   Prev.:   Prev.:   Lowest sugar was  40s >> 45 (fingerstick) >> 50s (in [place]) >> 40s; she has hypoglycemia awareness in the 75s. Highest sugar was 500s >> 300s >> 300s >> 40q1.  Glucometer: One Touch Verio  -No CKD, last BUN/creatinine:  Lab Results  Component Value Date   BUN 13 09/20/2020   BUN 21 01/06/2019   CREATININE 0.70 09/20/2020   CREATININE 0.82 01/06/2019  Not on ACE inhibitor/ARB.  -No HL; last set of lipids: Lab Results  Component Value Date   CHOL 175 09/20/2020   HDL 71.50 09/20/2020   LDLCALC 88 09/20/2020   TRIG 76.0 09/20/2020   CHOLHDL 2 09/20/2020  On Lipitor 20.  - last eye exam was in 06/2019: No DR. Coming up.  -No numbness and tingling in her feet.  Pt has FH of DM in [place].  She has OSA and started a CPAP after which she felt much better.  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    She is on levothyroxine 88 mcg 1 tablet 6/7 days and 1.5 tablets 1/7 days.    Latest TSH was normal: Lab Results  Component Value Date   TSH 0.97 11/16/2020   She lives alone.  ROS: + See HPI  I reviewed pt's medications, allergies, PMH, social hx, family hx, and changes were documented in the history of present illness. Otherwise, unchanged from my initial visit note.  Past Medical History:  Diagnosis Date   Allergic rhinitis    Allergy    Asthma    Atypical ductal hyperplasia of right breast    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   Breast cancer ([place])    Diabetes mellitus without complication ([place])    Family history of breast cancer    Family history of colon  cancer    Hypercholesteremia    Hypothyroidism    Lumbar herniated disc    OSA (obstructive sleep apnea)    used to use CPAP, not any more over 1 1/2 yrs   Salivary stone    left side   Sleep apnea    no cpap in 2 yrs    Vitamin D deficiency    Past Surgical History:  Procedure Laterality Date   APPENDECTOMY     BREAST LUMPECTOMY WITH RADIOACTIVE SEED LOCALIZATION Right 08/12/2017   Procedure: RIGHT BREAST LUMPECTOMY X'S 2 WITH RADIOACTIVE SEED LOCALIZATION X'S 2;  Surgeon: Fanny Skates, MD;  Location: [facility];  Service: General;  Laterality: Right;   COLONOSCOPY     11 yrs ago in [place] but given a 10 yr recall per pt    GANGLION CYST EXCISION     75 years old   [place]  2014   [place]   TONSILLECTOMY     age 75   Social History   Socioeconomic History   Marital status: Divorced    Spouse name: Not on file   Number of children: 2   Years of education: Not on file   Highest education level: Not on file  Occupational History   Not on file  Tobacco Use   Smoking status: Former Smoker    Packs/day: 1.00    Years: 2.00    Pack years: 2.00    Types: Cigarettes   Smokeless tobacco: Never Used   Tobacco comment: quite smoker at age 25  Substance and Sexual Activity   Alcohol use: Yes    Alcohol/week: 0.0 - 1.0 standard drinks    Comment: 0-1 per month   Drug use: No   Sexual activity: Not Currently  Other Topics Concern   Not on file  Social History Narrative   Single.   Moved from [place] to [place].   Family lives in [place].   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   Social Determinants of Health   Financial Resource Strain:    Difficulty of Paying Living Expenses: Not on file  Food Insecurity:    Worried About Charity fundraiser in the Last Year: Not on file   [employer] of Food in  the Last Year: Not on file  Transportation Needs:    Lack of Transportation (Medical): Not on file   Lack of Transportation (Non-Medical): Not on file  Physical Activity:    Days of Exercise per  Week: Not on file   Minutes of Exercise per Session: Not on file  Stress:    Feeling of Stress : Not on file  Social Connections:    Frequency of Communication with Friends and Family: Not on file   Frequency of Social Gatherings with Friends and Family: Not on file   Attends Religious Services: Not on file   Active Member of [place] or Organizations: Not on file   Attends Archivist Meetings: Not on file   Marital Status: Not on file  Intimate Partner Violence:    Fear of Current or Ex-Partner: Not on file   Emotionally Abused: Not on file   Physically Abused: Not on file   Sexually Abused: Not on file   Current Outpatient Medications on File Prior to Visit  Medication Sig Dispense Refill   albuterol (VENTOLIN HFA) 108 (90 Base) MCG/ACT inhaler INHALE 2 PUFFS BY MOUTH EVERY 6 HOURS AS NEEDED FOR WHEEZE OR SHORTNESS OF BREATH 8 g 0   amoxicillin-clavulanate (AUGMENTIN) 875-125 MG tablet Take 1 tablet by mouth 2 (two) times daily. (Patient not taking: Reported on 12/13/2020) 14 tablet 0   anastrozole (ARIMIDEX) 1 MG tablet TAKE 1 TABLET BY MOUTH EVERY DAY 90 tablet 4   atorvastatin (LIPITOR) 20 MG tablet TAKE 1 TABLET BY MOUTH EVERY DAY FOR CHOLESTEROL 90 tablet 0   Continuous Blood Gluc Receiver (DEXCOM G6 RECEIVER) DEVI Use to monitor blood sugar 1 each 0   Continuous Blood Gluc Sensor (DEXCOM G6 SENSOR) MISC Use as instructed change sensor every 10 days E11.65 9 each 1   Continuous Blood Gluc Transmit (DEXCOM G6 TRANSMITTER) MISC 1 Device by Does not apply route every 3 (three) months. 1 each 0   fluticasone (FLONASE) 50 MCG/ACT nasal spray INSTILL 1 SPRAY IN EACH NOSTRIL TWICE A DAY AS NEEDED FOR ALLERGIES OR RHINITIS 48 mL 0   fluticasone-salmeterol (WIXELA INHUB) 250-50 MCG/ACT  AEPB TAKE 1 PUFF BY MOUTH TWICE A DAY 60 each 11   insulin aspart (NOVOLOG) 100 UNIT/ML injection Inject 66 Units into the skin once for 1 dose. Via pump 60 mL 1   Insulin Disposable Pump (V-GO 20) KIT Use 1x a day 90 kit 3   Insulin Pen Needle 32G X 4 MM MISC Use 5-6x a day for insulin injections 500 each 3   levothyroxine (SYNTHROID) 88 MCG tablet TAKE 1 AND 1/2 TABLET BY MOUTH EVERY SUNDAY. TAKE 1 TABLET BY MOUTH MON THROUGH SAT. TAKE ON AN EMPTY STOMACH WITH WATER ONLY. NO FOOD OR OTHER MEDICATIONS FOR 30 MINUTES. 90 tablet 3   metFORMIN (GLUCOPHAGE-XR) 500 MG 24 hr tablet TAKE 2 TABLETS (1,000 MG TOTAL) BY MOUTH DAILY WITH BREAKFAST. FOR DIABETES. 180 tablet 1   montelukast (SINGULAIR) 10 MG tablet TAKE 1 TABLET (10 MG TOTAL) BY MOUTH AT BEDTIME. FOR ALLERGIES AND ASTHMA. 90 tablet 1   OZEMPIC, 0.25 OR 0.5 MG/DOSE, 2 MG/1.5ML SOPN INJECT 0.5 MG INTO THE SKIN ONCE A WEEK. 4.5 mL 3   No current facility-administered medications on file prior to visit.   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 She described it was a small amount in a milk carton in [place]. She states they stopped using it shortly after because the trial wasn't successful. SPM   Family History  Problem Relation Age of Onset   Alzheimer's disease Mother    Asthma Mother  Dementia Mother        cause of death   Arthritis Father    Heart attack Father 19       cause of death   Breast cancer Sister 70   Healthy Daughter    Colon cancer Paternal Grandmother    Liver cancer Maternal Uncle    Healthy Daughter    Healthy Brother    Healthy Sister    Colon polyps Neg Hx    Rectal cancer Neg Hx    Stomach cancer Neg Hx    PE: BP 120/68 (BP Location: Left Arm, Patient Position: Sitting, Cuff Size: Normal)   Pulse 81   Ht '5\' 4"'  (1.626 m)   Wt 189 lb 9.6 oz (86 kg)   SpO2 98%   BMI 32.54 kg/m  Wt Readings  from Last 3 Encounters:  01/01/21 189 lb 9.6 oz (86 kg)  12/13/20 190 lb 12.8 oz (86.5 kg)  11/16/20 191 lb (86.6 kg)   Constitutional: overweight, in NAD Eyes: PERRLA, EOMI, no exophthalmos ENT: moist mucous membranes, no thyromegaly, no cervical lymphadenopathy Cardiovascular: RRR, No MRG Respiratory: CTA B Gastrointestinal: abdomen soft, NT, ND, BS+ Musculoskeletal: no deformities, strength intact in all 4 Skin: moist, warm, no rashes Neurological: no tremor with outstretched hands, DTR normal in all 4  ASSESSMENT: 1. DM2, insulin-dependent, uncontrolled, without long-term complications, but with hyperglycemia  2. HL  3. Obesity class 1  PLAN:  1. Patient with longstanding, insulin-dependent diabetes, on basal-bolus insulin regimen, metformin, and also weekly GLP-1 receptor agonist, with improved control after she started a more plant-based diet but with still very fluctuating blood sugars.  Sugars improved also after starting the V-Go patch pump earlier in the year, but she continues to have significant swings in blood sugars after the 300s and even 400s.  At last visit, HbA1c was 6.8%, improved.  At that time, sugars are higher after late dinners and I advised him to increase the insulin if she has to have a late meal.  I also advised her against correcting blood sugars at night as she was dropping her sugars too much after 2 AM, with a nadir around 8-9 AM.  Sugars were significantly better at last visit and not increasing as much after lunch as they did before. -She just returned from a trip to [place] last month. CGM interpretation: -At today's visit, we reviewed her CGM downloads: It appears that 64.6% of values are in target range (goal >70%), while 34.3% are higher than 180 (goal <25%), and 1.1% are lower than 70 (goal <4%).  The calculated average blood sugar is 167.  The projected HbA1c for the next 3 months ([facility]MI) is 7.3%. -Reviewing the CGM trends, it appears that her sugars  increase after every meal, especially so after breakfast and dinner and also in the middle of the night, again with a peak around 2 AM.  They appear to have improved, however, in the last 2 weeks, compared to the previous 2 weeks.  Also, she has occasional days in which the sugars are mostly at goal, however, in the majority of the days, sugars are very fluctuating. -She is very sensitive to insulin, indicating very low insulin production.  She has instances in which she misses insulin before a meal and sugars increased to 300s and they take a long time to improve.  I suspect that the low blood sugars overnight are due to correcting blood sugars at bedtime.  She is actually correcting her sugars higher than  200s and at this visit we discussed again about only correcting if sugars are higher than 250.   -Also, upon questioning, she is over correcting low blood sugars -with juice and to Snickers bar.  She is following improvement in blood sugars with a CGM and I advised her that this is not recommended, but she needs to check it with her glucometer.  Also, we discussed about the 15-15 hypoglycemia correction rule and gave her written instruction about this -We also discussed about how to bolus for meals if the sugars are low before a meal.  I advised her to only skip insulin completely if sugars are lower than 60 before the meal but if between 60 and 90 to still take the insulin but at the start of the meal. -For now we will continue the rest of the regimen -If she continues to drop her blood sugars, we may need to stop the V-Go and go back to basal insulin. - I suggested to:  Patient Instructions  Please continue: - Metformin ER 1000 mg with breakfast - Ozempic 1 mg weekly - [id number] with 4-5 clicks before meal (and 1 click for correction at night). If you have a late dinner, please use 5-6 units. If sugars before meals <60, do not take Novolog before that meal. If sugars before meals <90, try to move the  bolus right before the meal At bedtime, only correct sugars >245, and only 1 click of Novolog.  When you correct a low, try to limit carbs to 15-30 g carbs and check sugars with the glucometer to see if improving.  Please return in 3-4 months.   - we checked her HbA1c: 7.3% (higher) - advised to check sugars at different times of the day - 4x a day, rotating check times - advised for yearly eye exams >> she is not UTD - return to clinic in 3-4 months  2. HL -Reviewed latest lipid panel from 09/2020: Fractions at goal: Lab Results  Component Value Date   CHOL 175 09/20/2020   HDL 71.50 09/20/2020   LDLCALC 88 09/20/2020   TRIG 76.0 09/20/2020   CHOLHDL 2 09/20/2020  -Continues on Lipitor 20 mg daily without side effects  3.  Obesity class I -She continues on Ozempic, which should also help with weight loss -She also follows a more plant-based diet -She lost 12 pounds before last visit  -She lost 5 more pounds since then  Philemon Kingdom, MD PhD [facility]

## 2021-01-01 NOTE — Patient Instructions (Addendum)
Please continue: - Metformin ER 1000 mg with breakfast - Ozempic 1 mg weekly - [id number] with 4-5 clicks before meal (and 1 click for correction at night). If you have a late dinner, please use 5-6 units. If sugars before meals <60, do not take Novolog before that meal. If sugars before meals <90, try to move the bolus right before the meal At bedtime, only correct sugars >315, and only 1 click of Novolog.  When you correct a low, try to limit carbs to 15-30 g carbs and check sugars with the glucometer to see if improving.  Please return in 3-4 months.

## 2021-01-02 ENCOUNTER — Other Ambulatory Visit: Payer: Self-pay | Admitting: Primary Care

## 2021-01-02 DIAGNOSIS — J4520 Mild intermittent asthma, uncomplicated: Secondary | ICD-10-CM

## 2021-01-03 ENCOUNTER — Other Ambulatory Visit: Payer: Self-pay

## 2021-01-03 ENCOUNTER — Telehealth: Payer: Self-pay | Admitting: Internal Medicine

## 2021-01-03 ENCOUNTER — Other Ambulatory Visit: Payer: Self-pay | Admitting: Primary Care

## 2021-01-03 DIAGNOSIS — E1165 Type 2 diabetes mellitus with hyperglycemia: Secondary | ICD-10-CM

## 2021-01-03 DIAGNOSIS — Z794 Long term (current) use of insulin: Secondary | ICD-10-CM

## 2021-01-03 MED ORDER — DEXCOM G6 SENSOR MISC: 1 refills | Status: DC

## 2021-01-03 NOTE — Telephone Encounter (Signed)
Pt would like a call back she is going on Vacation and needs this refilled, she said that the pharmacy [place] and is wanting to know know what her next option is

## 2021-01-03 NOTE — Telephone Encounter (Signed)
Pt calling in to request refill of Continuous Blood Gluc Sensor (DEXCOM G6 SENSOR) . Pt has none left. If cannot receive refill are there any samples she can have?  [id number] - [place], [place] - [address]  Pt contact [phone]

## 2021-01-04 NOTE — Telephone Encounter (Signed)
Left message to return call to our office.  

## 2021-01-21 LAB — HM DIABETES EYE EXAM

## 2021-01-26 ENCOUNTER — Other Ambulatory Visit: Payer: Self-pay | Admitting: Primary Care

## 2021-01-26 DIAGNOSIS — J302 Other seasonal allergic rhinitis: Secondary | ICD-10-CM

## 2021-02-19 ENCOUNTER — Encounter: Payer: Self-pay | Admitting: Internal Medicine

## 2021-02-27 ENCOUNTER — Other Ambulatory Visit: Payer: Self-pay | Admitting: Family Medicine

## 2021-02-27 DIAGNOSIS — E785 Hyperlipidemia, unspecified: Secondary | ICD-10-CM

## 2021-04-04 ENCOUNTER — Ambulatory Visit (INDEPENDENT_AMBULATORY_CARE_PROVIDER_SITE_OTHER): Payer: Medicare Other | Admitting: Internal Medicine

## 2021-04-04 ENCOUNTER — Encounter: Payer: Self-pay | Admitting: Internal Medicine

## 2021-04-04 ENCOUNTER — Other Ambulatory Visit: Payer: Self-pay

## 2021-04-04 VITALS — BP 110/72 | HR 88 | Ht 64.0 in | Wt 188.0 lb

## 2021-04-04 DIAGNOSIS — E669 Obesity, unspecified: Secondary | ICD-10-CM | POA: Diagnosis not present

## 2021-04-04 DIAGNOSIS — E1165 Type 2 diabetes mellitus with hyperglycemia: Secondary | ICD-10-CM

## 2021-04-04 DIAGNOSIS — Z794 Long term (current) use of insulin: Secondary | ICD-10-CM | POA: Diagnosis not present

## 2021-04-04 DIAGNOSIS — E785 Hyperlipidemia, unspecified: Secondary | ICD-10-CM

## 2021-04-04 LAB — POCT GLYCOSYLATED HEMOGLOBIN (HGB A1C): Hemoglobin A1C: 7.2 % — AB (ref 4.0–5.6)

## 2021-04-04 MED ORDER — DEXCOM G6 SENSOR MISC: 3 refills | Status: DC

## 2021-04-04 NOTE — Progress Notes (Signed)
Patient ID: Molly Wall, female   DOB: 06-08-1945, 76 y.o.   MRN: [id number]   This visit occurred during the SARS-CoV-2 public health emergency.  Safety protocols were in place, including screening questions prior to the visit, additional usage of staff PPE, and extensive cleaning of exam room while observing appropriate contact time as indicated for disinfecting solutions.   HPI: Molly Wall is a 76 y.o.-year-old female, initially referred by her PCP, Alma Friendly, MD, returning for follow-up for DM2, dx at 52 (2017), h/o reactive hypoglycemia, insulin-dependent, uncontrolled, without long-term complications.  Last visit 3 month ago.  Interim history: She denies increased urination, blurry vision, nausea, chest pain. She c/o poor memory. Before last visit, she just started diabetic Doterra essential oils capsules and sugars started to improve. She continues on these but they are causing increase gas  and abdominal discomfort.  She also has nausea. She had Covid19 last month.  She recovered well. She will be traveling to [place] for 2 months to volunteer - in March.  Her insurance does not allow her to fill more than 1 month of Dexcom at the time.  She is worried that she would be in another country without the sensor.  Reviewed HbA1c levels: Lab Results  Component Value Date   HGBA1C 7.1 (A) 01/01/2021   HGBA1C 6.8 (A) 09/20/2020   HGBA1C 8.1 (A) 06/21/2020   HGBA1C 8.7 (A) 03/19/2020   HGBA1C 8.2 (A) 09/07/2019   HGBA1C 9.4 (H) 06/06/2019   HGBA1C 9.1 (A) 02/08/2019   HGBA1C 9.6 (A) 10/27/2018   HGBA1C 8.2 (A) 07/30/2018   HGBA1C 11.1 (A) 04/27/2018   She is on: - Metformin ER 1000 mg with breakfast - Ozempic 0.5 >> 1 mg weekly  - VGo 30 4-5 >> 3-4 clicks per meal and 2-3 clicks >> 1 clicks for correction at night >>  [id number] 4-5 clicks per meal but 5-6 clicks for a late dinner >> 4 clicks for B and L and 5-6 clicks for dinner  Previously on: - Lantus  35 >> 25 units in am >> 25 units in a.m. and 10 units at bedtime >> 20 units in a.m. and 10 units at night - Novolog 7-9 units before 15 min before meals >> B'fast: 6-8 units  Lunch: 10-11 units Dinner: 6-7 units  She checks her sugars more than 4 times a day with her CGM:   Prev.:   Prev.:   Lowest sugar was  50s (in [place]) >> 40s >> 55 after b'fast today; she has hypoglycemia awareness in the 82s. Highest sugar was 500s >> 300s >> 300s >> 400 >> 300s.  Glucometer: One Touch Verio  -No CKD, last BUN/creatinine:  Lab Results  Component Value Date   BUN 13 09/20/2020   BUN 21 01/06/2019   CREATININE 0.70 09/20/2020   CREATININE 0.82 01/06/2019  Not on ACE inhibitor/ARB.  -No HL; last set of lipids: Lab Results  Component Value Date   CHOL 175 09/20/2020   HDL 71.50 09/20/2020   LDLCALC 88 09/20/2020   TRIG 76.0 09/20/2020   CHOLHDL 2 09/20/2020  On Lipitor 20.  - last eye exam was in 01/2021: No DR.   -No numbness and tingling in her feet.  Pt has FH of DM in [place].  She has OSA and started a CPAP after which she felt much better.  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    She is on levothyroxine 88 mcg 1 tablet 6/7 days and 1.5 tablets 1/7 days.    Latest TSH was normal: Lab Results  Component Value Date   TSH 0.97 11/16/2020   She lives alone.  ROS: + See HPI  I reviewed pt's medications, allergies, PMH, social hx, family hx, and changes were documented in the history of present illness. Otherwise, unchanged from my initial visit note.  Past Medical History:  Diagnosis Date   Allergic rhinitis    Allergy    Asthma    Atypical ductal hyperplasia of right breast    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   Breast cancer ([place])    Diabetes mellitus without  complication ([place])    Family history of breast cancer    Family history of colon cancer    Hypercholesteremia    Hypothyroidism    Lumbar herniated disc    OSA (obstructive sleep apnea)    used to use CPAP, not any more over 1 1/2 yrs   Salivary stone    left side   Sleep apnea    no cpap in 2 yrs    Vitamin D deficiency    Past Surgical History:  Procedure Laterality Date   APPENDECTOMY     BREAST LUMPECTOMY WITH RADIOACTIVE SEED LOCALIZATION Right 08/12/2017   Procedure: RIGHT BREAST LUMPECTOMY X'S 2 WITH RADIOACTIVE SEED LOCALIZATION X'S 2;  Surgeon: Fanny Skates, MD;  Location: [facility];  Service: General;  Laterality: Right;   COLONOSCOPY     11 yrs ago in [place] but given a 10 yr recall per pt    GANGLION CYST EXCISION     76 years old   [place]  2014   [place]   TONSILLECTOMY     age 36   Social History   Socioeconomic History   Marital status: Divorced    Spouse name: Not on file   Number of children: 2   Years of education: Not on file   Highest education level: Not on file  Occupational History   Not on file  Tobacco Use   Smoking status: Former Smoker    Packs/day: 1.00    Years: 2.00    Pack years: 2.00    Types: Cigarettes   Smokeless tobacco: Never Used   Tobacco comment: quite smoker at age 51  Substance and Sexual Activity   Alcohol use: Yes    Alcohol/week: 0.0 - 1.0 standard drinks    Comment: 0-1 per month   Drug use: No   Sexual activity: Not Currently  Other Topics Concern   Not on file  Social History Narrative   Single.   Moved from [place] to [place].   Family lives in [place].   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   Social Determinants of Health   Financial Resource Strain:    Difficulty of Paying Living Expenses: Not on file  Food Insecurity:     Worried About Charity fundraiser in the Last Year: Not on file   [employer] of Food in the Last Year: Not on file  Transportation Needs:    Lack of Transportation (Medical): Not on file   Lack  of Transportation (Non-Medical): Not on file  Physical Activity:    Days of Exercise per Week: Not on file   Minutes of Exercise per Session: Not on file  Stress:    Feeling of Stress : Not on file  Social Connections:    Frequency of Communication with Friends and Family: Not on file   Frequency of Social Gatherings with Friends and Family: Not on file   Attends Religious Services: Not on file   Active Member of [employer] or Organizations: Not on file   Attends Archivist Meetings: Not on file   Marital Status: Not on file  Intimate Partner Violence:    Fear of Current or Ex-Partner: Not on file   Emotionally Abused: Not on file   Physically Abused: Not on file   Sexually Abused: Not on file   Current Outpatient Medications on File Prior to Visit  Medication Sig Dispense Refill   albuterol (VENTOLIN HFA) 108 (90 Base) MCG/ACT inhaler INHALE 2 PUFFS BY MOUTH EVERY 6 HOURS AS NEEDED FOR WHEEZE OR SHORTNESS OF BREATH 8 g 0   amoxicillin-clavulanate (AUGMENTIN) 875-125 MG tablet Take 1 tablet by mouth 2 (two) times daily. 14 tablet 0   anastrozole (ARIMIDEX) 1 MG tablet TAKE 1 TABLET BY MOUTH EVERY DAY 90 tablet 4   atorvastatin (LIPITOR) 20 MG tablet TAKE 1 TABLET BY MOUTH EVERY DAY FOR CHOLESTEROL 90 tablet 1   Continuous Blood Gluc Receiver (DEXCOM G6 RECEIVER) DEVI Use to monitor blood sugar 1 each 0   Continuous Blood Gluc Sensor (DEXCOM G6 SENSOR) MISC Use as instructed change sensor every 10 days E11.65 9 each 1   Continuous Blood Gluc Transmit (DEXCOM G6 TRANSMITTER) MISC 1 Device by Does not apply route every 3 (three) months. 1 each 0   fluticasone (FLONASE) 50 MCG/ACT nasal spray INSTILL 1 SPRAY IN EACH NOSTRIL TWICE A DAY AS NEEDED FOR ALLERGIES OR RHINITIS 48 mL 0    fluticasone-salmeterol (WIXELA INHUB) 250-50 MCG/ACT AEPB TAKE 1 PUFF BY MOUTH TWICE A DAY 60 each 11   glucose blood (ONETOUCH VERIO) test strip USE AS DIRECTED TO CHECK FASTING BLOOD SUGARS THREE TIMES DAILY. INSULIN DEPENDENT DX CODE E11.9 300 strip 1   insulin aspart (NOVOLOG) 100 UNIT/ML injection Inject 66 Units into the skin once for 1 dose. Via pump 60 mL 1   Insulin Disposable Pump (V-GO 20) KIT Use 1x a day 90 kit 3   Insulin Pen Needle 32G X 4 MM MISC Use 5-6x a day for insulin injections 500 each 3   levothyroxine (SYNTHROID) 88 MCG tablet TAKE 1 AND 1/2 TABLET BY MOUTH EVERY SUNDAY. TAKE 1 TABLET BY MOUTH MON THROUGH SAT. TAKE ON AN EMPTY STOMACH WITH WATER ONLY. NO FOOD OR OTHER MEDICATIONS FOR 30 MINUTES. 90 tablet 3   metFORMIN (GLUCOPHAGE-XR) 500 MG 24 hr tablet TAKE 2 TABLETS (1,000 MG TOTAL) BY MOUTH DAILY WITH BREAKFAST. FOR DIABETES. 180 tablet 1   montelukast (SINGULAIR) 10 MG tablet TAKE 1 TABLET (10 MG TOTAL) BY MOUTH AT BEDTIME. FOR ALLERGIES AND ASTHMA. 90 tablet 2   OZEMPIC, 0.25 OR 0.5 MG/DOSE, 2 MG/1.5ML SOPN INJECT 0.5 MG INTO THE SKIN ONCE A WEEK. 4.5 mL 3   No current facility-administered medications on file prior to visit.   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 She described it was a small amount in a milk carton  in [place]. She states they stopped using it shortly after because the trial wasn't successful. SPM   Family History  Problem Relation Age of Onset   Alzheimer's disease Mother    Asthma Mother    Dementia Mother        cause of death   Arthritis Father    Heart attack Father 10       cause of death   Breast cancer Sister 55   Healthy Daughter    Colon cancer Paternal Grandmother    Liver cancer Maternal Uncle    Healthy Daughter    Healthy Brother    Healthy Sister    Colon polyps Neg Hx    Rectal cancer Neg Hx    Stomach  cancer Neg Hx    PE: BP 110/72 (BP Location: Left Arm, Patient Position: Sitting, Cuff Size: Normal)    Pulse 88    Ht '5\' 4"'  (1.626 m)    Wt 188 lb (85.3 kg)    SpO2 96%    BMI 32.27 kg/m  Wt Readings from Last 3 Encounters:  04/04/21 188 lb (85.3 kg)  01/01/21 189 lb 9.6 oz (86 kg)  12/13/20 190 lb 12.8 oz (86.5 kg)   Constitutional: overweight, in NAD Eyes: PERRLA, EOMI, no exophthalmos ENT: moist mucous membranes, no thyromegaly, no cervical lymphadenopathy Cardiovascular: RRR, No MRG Respiratory: CTA B Musculoskeletal: no deformities, strength intact in all 4 Skin: moist, warm, no rashes Neurological: no tremor with outstretched hands, DTR normal in all 4  ASSESSMENT: 1. DM2, insulin-dependent, uncontrolled, without long-term complications, but with hyperglycemia  2. HL  3. Obesity class 1  PLAN:  1. Patient with longstanding, insulin-dependent diabetes, on basal-bolus insulin regimen, metformin, and weekly GLP-1 receptor agonist, with improved control after she started a more plant-based diet but still with very fluctuating blood sugars.  Sugars also improved after starting the VGo patch pump but at last visit, they were still increasing to 300s.  At that time, HbA1c was slightly higher, at 7.1%.  She is very insulin sensitive, indicating very low insulin production.  If she misses insulin before a meal, sugars increased to 300s and they take a long time to improve.  At last visit, she was over correcting low blood sugars and we discussed about how to do this correctly.  We also discussed about how to bolus for meals if the sugars were low before meals and I gave her specific return instructions.  I advised her to only skip insulin completely if sugars are lower than 60 before the meal and to decrease the lack time if the sugars are between 60 and 90.  We did discuss that if she continued to drop her blood sugars, we may need to stop the VGo and go back to basal insulin. CGM  interpretation: -At today's visit, we reviewed her CGM downloads: It appears that 60.9% of values are in target range (goal >70%), while 37.2% are higher than 180 (goal <25%), and 1.9% are lower than 70 (goal <4%).  The calculated average blood sugar is 169.  The projected HbA1c for the next 3 months ([facility]) is 7.4%. -Reviewing the CGM trends, it appears that her sugars are still dropping overnight after an initial hypoglycemic peak around midnight.  As we discussed at last visit, she corrects blood sugars above 200s with 1 unit of NovoLog.  To avoid further drops of blood sugars overnight, I advised her not to correct these sugars anymore.  Also, we discussed about improving  blood sugars after dinner.  As of now, she is using 5-6 clicks before dinner, but we discussed about increasing to 7 units occasionally, for a larger meal, as needed.  She is using fixed doses, 4 clicks before breakfast and lunch and we discussed that she sometimes needs to use more than this for age, but especially for lunch, after which she experiences a significant hyperglycemic peak. -Before today's visit, she had a low blood sugar at 55.  It is unclear why she dropped her sugars but possibly due to the fact that she only had 1 egg this morning -we discussed that for such small meals, without carbs, she may need to bolus less insulin. -She describes that occasionally her blood sugars are increasing in the morning before drinking coffee or eating -I explained that this is possibly related to phenomenon.  Unfortunately, this issue can only be solved with a regular insulin pump, but as of now, even the VGo pump is very expensive for her and we cannot switch to another pump. -At today's visit, so that she does not run out of sensors while [place] of the country, I gave her 2 samples of freestyle libre 3 and discussed about how this works.  This will cover her for an extra months.  I also sent another prescription for Dexcom to her pharmacy. - I  suggested to:  Patient Instructions  Please continue: - Metformin ER 1000 mg with breakfast - Ozempic 1 mg weekly  Please change: - [id number] with 4-5 clicks before meal and 5-7 clicks before dinner Do not correct high blood sugars at night. If you have a late dinner, please use 5-6 units. If sugars before meals <60, do not take Novolog before that meal. If sugars before meals <90, try to move the bolus right before the meal  Please return in 3-4 months.   - we checked her HbA1c: 7.6% (higher) - advised to check sugars at different times of the day - 4x a day, rotating check times - advised for yearly eye exams >> she is UTD - return to clinic in 3-4 months  2. HL -reviewed the latest lipid panel from 09/2020: Fractions at goal: Lab Results  Component Value Date   CHOL 175 09/20/2020   HDL 71.50 09/20/2020   LDLCALC 88 09/20/2020   TRIG 76.0 09/20/2020   CHOLHDL 2 09/20/2020  -She continues on Lipitor 20 mg daily without side effects  3.  Obesity class I -She continues on Ozempic which should also help with weight loss -She follows a more plant-based diet -She lost 17 pounds before the last 2 visits combined -She lost 1 more pound since last visit  Total time spent for the visit: 40 min, in reviewing her insulin doses, discussing her hypo- and hyper-glycemic episodes, reviewing previous labs and also reviewing her dietary habits, and developing a plan to avoid hypo- and hyper-glycemia.  I also advised her to use a freestyle libre 3 CGM.  Philemon Kingdom, MD PhD [facility]

## 2021-04-04 NOTE — Patient Instructions (Addendum)
Please continue: - Metformin ER 1000 mg with breakfast - Ozempic 1 mg weekly  Please change: - [id number] with 4-5 clicks before meal and 5-7 clicks before dinner Do not correct high blood sugars at night. If you have a late dinner, please use 5-6 units. If sugars before meals <60, do not take Novolog before that meal. If sugars before meals <90, try to move the bolus right before the meal  Please return in 3-4 months.

## 2021-04-11 ENCOUNTER — Other Ambulatory Visit: Payer: Self-pay | Admitting: Internal Medicine

## 2021-04-15 ENCOUNTER — Other Ambulatory Visit: Payer: Self-pay | Admitting: Internal Medicine

## 2021-04-15 MED ORDER — INSULIN ASPART 100 UNIT/ML IJ SOLN: INTRAMUSCULAR | 3 refills | Status: DC

## 2021-04-16 ENCOUNTER — Inpatient Hospital Stay: Payer: Medicare Other

## 2021-04-16 ENCOUNTER — Inpatient Hospital Stay: Payer: Medicare Other | Attending: Adult Health | Admitting: Adult Health

## 2021-04-16 ENCOUNTER — Other Ambulatory Visit: Payer: Self-pay

## 2021-04-16 ENCOUNTER — Encounter: Payer: Self-pay | Admitting: Adult Health

## 2021-04-16 VITALS — BP 153/71 | HR 110 | Temp 97.7°F | Resp 18 | Wt 191.8 lb

## 2021-04-16 DIAGNOSIS — Z1231 Encounter for screening mammogram for malignant neoplasm of breast: Secondary | ICD-10-CM

## 2021-04-16 DIAGNOSIS — N6091 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: Secondary | ICD-10-CM

## 2021-04-16 DIAGNOSIS — Z79899 Other long term (current) drug therapy: Secondary | ICD-10-CM | POA: Insufficient documentation

## 2021-04-16 DIAGNOSIS — Z1239 Encounter for other screening for malignant neoplasm of breast: Secondary | ICD-10-CM

## 2021-04-16 DIAGNOSIS — M8580 Other specified disorders of bone density and structure, unspecified site: Secondary | ICD-10-CM | POA: Diagnosis not present

## 2021-04-16 LAB — CBC WITH DIFFERENTIAL (CANCER CENTER ONLY)
Abs Immature Granulocytes: 0.01 10*3/uL (ref 0.00–0.07)
Basophils Absolute: 0 10*3/uL (ref 0.0–0.1)
Basophils Relative: 1 %
Eosinophils Absolute: 0.3 10*3/uL (ref 0.0–0.5)
Eosinophils Relative: 5 %
HCT: 42.5 % (ref 36.0–46.0)
Hemoglobin: 13.8 g/dL (ref 12.0–15.0)
Immature Granulocytes: 0 %
Lymphocytes Relative: 23 %
Lymphs Abs: 1.3 10*3/uL (ref 0.7–4.0)
MCH: 28.3 pg (ref 26.0–34.0)
MCHC: 32.5 g/dL (ref 30.0–36.0)
MCV: 87.1 fL (ref 80.0–100.0)
Monocytes Absolute: 0.4 10*3/uL (ref 0.1–1.0)
Monocytes Relative: 6 %
Neutro Abs: 3.8 10*3/uL (ref 1.7–7.7)
Neutrophils Relative %: 65 %
Platelet Count: 238 10*3/uL (ref 150–400)
RBC: 4.88 MIL/uL (ref 3.87–5.11)
RDW: 13.3 % (ref 11.5–15.5)
WBC Count: 5.8 10*3/uL (ref 4.0–10.5)
nRBC: 0 % (ref 0.0–0.2)

## 2021-04-16 LAB — CMP (CANCER CENTER ONLY)
ALT: 9 U/L (ref 0–44)
AST: 13 U/L — ABNORMAL LOW (ref 15–41)
Albumin: 4.2 g/dL (ref 3.5–5.0)
Alkaline Phosphatase: 94 U/L (ref 38–126)
Anion gap: 6 (ref 5–15)
BUN: 15 mg/dL (ref 8–23)
CO2: 31 mmol/L (ref 22–32)
Calcium: 9.8 mg/dL (ref 8.9–10.3)
Chloride: 103 mmol/L (ref 98–111)
Creatinine: 0.67 mg/dL (ref 0.44–1.00)
GFR, Estimated: >60 mL/min (ref >60)
Glucose, Bld: 64 mg/dL — ABNORMAL LOW (ref 70–99)
Potassium: 4.1 mmol/L (ref 3.5–5.1)
Sodium: 140 mmol/L (ref 135–145)
Total Bilirubin: 0.4 mg/dL (ref 0.3–1.2)
Total Protein: 6.9 g/dL (ref 6.5–8.1)

## 2021-04-16 NOTE — Progress Notes (Signed)
[facility] Follow up:    [name], [place] [address]   DIAGNOSIS: ADH  SUMMARY OF HIGH RISK HISTORY: 76 y.o. [place], [place] woman status post right breast biopsy x2 06/26/2017 showing atypical ductal hyperplasia and atypical lobular hyperplasia   (1) right lumpectomy x2 on 08/12/2017 shows no evidence of malignancy at either site   (2) genetics testing 11/23/2017 through the Hereditary Gene Panel offered by [facility] found no deleterious mutations in APC, ATM, AXIN2, BARD1, BMPR1A, BRCA1, BRCA2, BRIP1, CDH1, CDK4, CDKN2A (p14ARF), CDKN2A (p16INK4a), CHEK2, CTNNA1, DICER1, EPCAM (Deletion/duplication testing only), GREM1 (promoter region deletion/duplication testing only), KIT, MEN1, MLH1, MSH2, MSH3, MSH6, MUTYH, NBN, NF1, NHTL1, PALB2, PDGFRA, PMS2, POLD1, POLE, PTEN, RAD50, RAD51C, RAD51D, SDHB, SDHC, SDHD, SMAD4, SMARCA4. STK11, TP53, TSC1, TSC2, and VHL.  The following genes were evaluated for sequence changes only: SDHA and HOXB13 c.251G>A variant only.   (3) breast cancer high risk: intensified screening:             (a) yearly mammography in May             (b) yearly breast MRI in November             (c) biannual MD breast exam   (4) breast cancer high risk: risk reduction             (a) anastrozole started 01/05/2018  CURRENT THERAPY:Anastrozole  INTERVAL HISTORY: Anieya Helman Crosslin McDill 76 y.o. female returns for evaluation of her high risk of breast cancer.  She underwent bone density testing on May 08, 2020 that showed osteopenia with a T score of -1.8 in the spine.  Her most recent breast MRI was completed on January 19, 2020 that showed no evidence of malignancy and recommended repeat mammogram in April 2022 and high rescreening MRI in 1 year.  Mell's health maintenance is up-to-date with the exception of the shingles vaccine and Tdap.  She believes she had these done with primary care.    Rubyann notes that she  is behind in her breast imaging because she was lost to follow-up with [place] and would like to get back to following up in our office for her increased risk of breast cancer.   Patient Active Problem List   Diagnosis Date Noted   Vegan diet 02/21/2020   Word finding difficulty 02/21/2020   Acute non-recurrent sinusitis 02/21/2020   Arthralgia of both hands 11/29/2019   Seasonal allergic rhinitis 11/29/2019   Chronic knee pain (intermittent) (Right) 11/01/2018   Chronic pain syndrome 10/31/2018   Flank pain 10/13/2018   Shortness of breath 10/13/2018   History iodine allergy 09/28/2018    Class: History of   DDD (degenerative disc disease), lumbar 09/27/2018   Grade 1 Anterolisthesis of L5/S1 09/16/2018   Lumbar facet arthropathy 09/16/2018   Arthropathy of lumbosacral facet joint 09/16/2018   Sialadenitis 08/24/2018   Genetic testing 11/27/2017   Atypical lobular hyperplasia [facility]) of right breast 11/06/2017   Family history of breast cancer    Family history of colon cancer    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   Constipation 04/13/2017   Chronic low back pain (Secondary area of Pain) (Bilateral) (R>L) 02/13/2016   Chronic lower extremity pain (Primary Area of Pain) (Right) 02/13/2016   Chronic lumbar radicular pain (Right) 02/13/2016   Type 2 diabetes mellitus with hyperglycemia, with long-term current use of insulin ([place]) 07/18/2015   Preventative health care 07/18/2015  Vitamin D deficiency 07/18/2015   Hyperlipidemia 12/13/2014   Asthma, chronic 12/13/2014   Hypothyroidism 12/13/2014   Lumbar herniated disc 12/13/2014    is allergic to iodine-131.  MEDICAL HISTORY: Past Medical History:  Diagnosis Date   Allergic rhinitis    Allergy    Asthma    Atypical ductal hyperplasia of right breast    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   Breast cancer ([place])    Diabetes mellitus without complication ([place])    Family history of breast  cancer    Family history of colon cancer    Hypercholesteremia    Hypothyroidism    Lumbar herniated disc    OSA (obstructive sleep apnea)    used to use CPAP, not any more over 1 1/2 yrs   Salivary stone    left side   Sleep apnea    no cpap in 2 yrs    Vitamin D deficiency     SURGICAL HISTORY: Past Surgical History:  Procedure Laterality Date   APPENDECTOMY     BREAST LUMPECTOMY WITH RADIOACTIVE SEED LOCALIZATION Right 08/12/2017   Procedure: RIGHT BREAST LUMPECTOMY X'S 2 WITH RADIOACTIVE SEED LOCALIZATION X'S 2;  Surgeon: Fanny Skates, MD;  Location: [facility];  Service: General;  Laterality: Right;   COLONOSCOPY     11 yrs ago in [place] - polyps but given a 10 yr recall per pt    GANGLION CYST EXCISION     76 years old   [place]  2014   [place]     age 39    SOCIAL HISTORY: Social History   Socioeconomic History   Marital status: Divorced    Spouse name: Not on file   Number of children: 2   Years of education: Not on file   Highest education level: Not on file  Occupational History   Not on file  Tobacco Use   Smoking status: Never   Smokeless tobacco: Never  Vaping Use   Vaping Use: Never used  Substance and Sexual Activity   Alcohol use: Yes    Alcohol/week: 0.0 - 1.0 standard drinks    Comment: 0-1 per month   Drug use: No   Sexual activity: Not Currently  Other Topics Concern   Not on file  Social History Narrative   Single.   Moved from [place] to [place] several weeks ago.   Family lives in [place].   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   Social Determinants of Health   Financial Resource Strain: Not on file  Food Insecurity: Not on file  Transportation Needs: Not on file  Physical Activity: Not on file  Stress: Not on file  Social Connections: Not on file   Intimate Partner Violence: Not on file    FAMILY HISTORY: Family History  Problem Relation Age of Onset   Alzheimer's disease Mother    Asthma Mother    Dementia Mother        cause of death   Arthritis Father    Heart attack Father 73       cause of death   Breast cancer Sister 52   Healthy Daughter    Colon cancer Paternal Grandmother    Liver cancer Maternal Interior and spatial designer    Healthy Daughter    Healthy Brother  Healthy Sister    Colon polyps Neg Hx    Rectal cancer Neg Hx    Stomach cancer Neg Hx     Review of Systems  Constitutional:  Negative for appetite change, chills, fatigue, fever and unexpected weight change.  HENT:   Negative for hearing loss, lump/mass and trouble swallowing.   Eyes:  Negative for eye problems and icterus.  Respiratory:  Negative for chest tightness, cough and shortness of breath.   Cardiovascular:  Negative for chest pain, leg swelling and palpitations.  Gastrointestinal:  Negative for abdominal distention, abdominal pain, constipation, diarrhea, nausea and vomiting.  Endocrine: Negative for hot flashes.  Genitourinary:  Negative for difficulty urinating.   Musculoskeletal:  Negative for arthralgias.  Skin:  Negative for itching and rash.  Neurological:  Negative for dizziness, extremity weakness, headaches and numbness.  Hematological:  Negative for adenopathy. Does not bruise/bleed easily.  Psychiatric/Behavioral:  Negative for depression. The patient is not nervous/anxious.      PHYSICAL EXAMINATION  ECOG PERFORMANCE STATUS: 1 - Symptomatic but completely ambulatory  Vitals:   04/16/21 1414  BP: (!) 153/71  Pulse: (!) 110  Resp: 18  Temp: 97.7 F (36.5 C)  SpO2: 97%    Physical Exam Constitutional:      General: She is not in acute distress.    Appearance: Normal appearance. She is not toxic-appearing.  HENT:     Head: Normocephalic and atraumatic.  Eyes:     General: No scleral icterus. Cardiovascular:     Rate and Rhythm:  Normal rate and regular rhythm.     Pulses: Normal pulses.     Heart sounds: Normal heart sounds.  Pulmonary:     Effort: Pulmonary effort is normal.     Breath sounds: Normal breath sounds.  Chest:     Comments: Bilateral breasts and axilla are negative for nodules masses or any other signs of breast cancer. Abdominal:     General: Abdomen is flat. Bowel sounds are normal. There is no distension.     Palpations: Abdomen is soft.     Tenderness: There is no abdominal tenderness.  Musculoskeletal:        General: No swelling.     Cervical back: Neck supple.  Lymphadenopathy:     Cervical: No cervical adenopathy.  Skin:    General: Skin is warm and dry.     Findings: No rash.  Neurological:     General: No focal deficit present.     Mental Status: She is alert.  Psychiatric:        Mood and Affect: Mood normal.        Behavior: Behavior normal.    LABORATORY DATA:  CBC    Component Value Date/Time   WBC 5.8 04/16/2021 1353   WBC 5.9 02/21/2020 1434   RBC 4.88 04/16/2021 1353   HGB 13.8 04/16/2021 1353   HCT 42.5 04/16/2021 1353   PLT 238 04/16/2021 1353   MCV 87.1 04/16/2021 1353   MCH 28.3 04/16/2021 1353   MCHC 32.5 04/16/2021 1353   RDW 13.3 04/16/2021 1353   LYMPHSABS 1.3 04/16/2021 1353   MONOABS 0.4 04/16/2021 1353   EOSABS 0.3 04/16/2021 1353   BASOSABS 0.0 04/16/2021 1353    CMP     Component Value Date/Time   NA 140 04/16/2021 1353   K 4.1 04/16/2021 1353   CL 103 04/16/2021 1353   CO2 31 04/16/2021 1353   GLUCOSE 64 (L) 04/16/2021 1353   BUN 15 04/16/2021 1353  CREATININE 0.67 04/16/2021 1353   CALCIUM 9.8 04/16/2021 1353   PROT 6.9 04/16/2021 1353   ALBUMIN 4.2 04/16/2021 1353   AST 13 (L) 04/16/2021 1353   ALT 9 04/16/2021 1353   ALKPHOS 94 04/16/2021 1353   BILITOT 0.4 04/16/2021 1353   GFRNONAA >60 04/16/2021 1353   GFRAA >60 01/06/2019 1416     ASSESSMENT and THERAPY PLAN:   Atypical lobular hyperplasia (ALH) of right  breast Geralynn is here for follow-up of her atypical lobular and atypical ductal hyperplasia.  She has no clinical or radiographic sign of breast cancer at today's visit.  Today we recommended the following.  1.  Intensified screening: She will continue with yearly mammograms alternating with yearly breast MRI due to her lifetime risk greater than 20% of developing breast cancer.  She was also recommended to continue to undergo a biannual clinical breast exam.  2.  Risk reduction: She continues on antiestrogen therapy with anastrozole that began in 2019.  She will continue this to complete 5 years of treatment which will go through 2024.  We also discussed healthy diet and exercise which can also reduce her risk.  3. Bone demineralization: Recent bone density test was completed on May 08, 2020.  This showed osteopenia with a T score of -1.8 in the spine.  We will continue to monitor this every 2 years and we discussed bone health today.  We will see Quyen back in about 6 months for follow-up of her elevated risk.  I noted her elevated blood pressure and slight tachycardia during today's visit.  She let me know that she is constipated and she sometimes develops tachycardia and elevated blood pressure when her constipation makes her uncomfortable.  She is starting to see results with her at home bowel regimen *  All questions were answered. The patient knows to call the clinic with any problems, questions or concerns. We can certainly see the patient much sooner if necessary.  Total encounter time: 30 minutes in face-to-face visit time, chart review, lab review, care coordination, and documentation of the encounter.  Wilber Bihari, NP 04/17/21 8:07 PM Medical Oncology and Hematology [facility] [place], [place] [address] Tel. [phone]    Fax. [phone]  *Total Encounter Time as defined by the Centers for Medicare and Medicaid Services includes, in addition  to the face-to-face time of a patient visit (documented in the note above) non-face-to-face time: obtaining and reviewing outside history, ordering and reviewing medications, tests or procedures, care coordination (communications with other health care professionals or caregivers) and documentation in the medical record.

## 2021-04-17 NOTE — Assessment & Plan Note (Addendum)
Molly Wall is here for follow-up of her atypical lobular and atypical ductal hyperplasia.  She has no clinical or radiographic sign of breast cancer at today's visit.  Today we recommended the following.  1.  Intensified screening: She will continue with yearly mammograms alternating with yearly breast MRI due to her lifetime risk greater than 20% of developing breast cancer.  She was also recommended to continue to undergo a biannual clinical breast exam.  2.  Risk reduction: She continues on antiestrogen therapy with anastrozole that began in 2019.  She will continue this to complete 5 years of treatment which will go through 2024.  We also discussed healthy diet and exercise which can also reduce her risk.  3. Bone demineralization: Recent bone density test was completed on May 08, 2020.  This showed osteopenia with a T score of -1.8 in the spine.  We will continue to monitor this every 2 years and we discussed bone health today.  We will see [name] back in about 6 months for follow-up of her elevated risk.

## 2021-04-25 ENCOUNTER — Encounter: Payer: Self-pay | Admitting: Primary Care

## 2021-04-25 ENCOUNTER — Other Ambulatory Visit: Payer: Self-pay

## 2021-04-25 ENCOUNTER — Ambulatory Visit (INDEPENDENT_AMBULATORY_CARE_PROVIDER_SITE_OTHER): Payer: Medicare Other | Admitting: Primary Care

## 2021-04-25 DIAGNOSIS — K219 Gastro-esophageal reflux disease without esophagitis: Secondary | ICD-10-CM

## 2021-04-25 DIAGNOSIS — E1165 Type 2 diabetes mellitus with hyperglycemia: Secondary | ICD-10-CM

## 2021-04-25 DIAGNOSIS — K5900 Constipation, unspecified: Secondary | ICD-10-CM

## 2021-04-25 MED ORDER — FREESTYLE LIBRE 3 SENSOR MISC: 1.0000 | 1 refills | Status: DC

## 2021-04-25 NOTE — Assessment & Plan Note (Signed)
Symptoms today seem representative.   Discussed use of famotidine 20 mg daily.  Discussed triggers for GERD including food, beverages, laying flat after eating.

## 2021-04-25 NOTE — Assessment & Plan Note (Addendum)
Improving with pre-pro biotic.  Encouraged to increase water intake. Encouraged continued regular exercise.   Discussed use of Colace daily.   She will update if symptoms return.   She had several questions regarding constipation, took about 20 mins of office visit time to discuss.

## 2021-04-25 NOTE — Patient Instructions (Signed)
Consider using docusate sodium (Colace) daily for firm stools.  Consider trying famotidine (Pepcid) 20 mg daily for heartburn/indigestion.  Continue the pre and probiotic.  Let me know which FreeStyle Libre sensor you have at home. Either the 2 or 3.   It was a pleasure to see you today!  Food Choices for Gastroesophageal Reflux Disease, Adult When you have gastroesophageal reflux disease (GERD), the foods you eat and your eating habits are very important. Choosing the right foods can help ease your discomfort. Think about working with a food expert (dietitian) to help you make good choices. What are tips for following this plan? Reading food labels Look for foods that are low in saturated fat. Foods that may help with your symptoms include: Foods that have less than 5% of daily value (DV) of fat. Foods that have 0 grams of trans fat. Cooking Do not fry your food. Cook your food by baking, steaming, grilling, or broiling. These are all methods that do not need a lot of fat for cooking. To add flavor, try to use herbs that are low in spice and acidity. Meal planning  Choose healthy foods that are low in fat, such as: Fruits and vegetables. Whole grains. Low-fat dairy products. Lean meats, fish, and poultry. Eat small meals often instead of eating 3 large meals each day. Eat your meals slowly in a place where you are relaxed. Avoid bending over or lying down until 2-3 hours after eating. Limit high-fat foods such as fatty meats or fried foods. Limit your intake of fatty foods, such as oils, butter, and shortening. Avoid the following as told by your doctor: Foods that cause symptoms. These may be different for different people. Keep a food diary to keep track of foods that cause symptoms. Alcohol. Drinking a lot of liquid with meals. Eating meals during the 2-3 hours before bed. Lifestyle Stay at a healthy weight. Ask your doctor what weight is healthy for you. If you need to lose  weight, work with your doctor to do so safely. Exercise for at least 30 minutes on 5 or more days each week, or as told by your doctor. Wear loose-fitting clothes. Do not smoke or use any products that contain nicotine or tobacco. If you need help quitting, ask your doctor. Sleep with the head of your bed higher than your feet. Use a wedge under the mattress or blocks under the bed frame to raise the head of the bed. Chew sugar-free gum after meals. What foods should eat? Eat a healthy, well-balanced diet of fruits, vegetables, whole grains, low-fat dairy products, lean meats, fish, and poultry. Each person is different. Foods that may cause symptoms in one person may not cause any symptoms in another person. Work with your doctor to find foods that are safe for you. The items listed above may not be a complete list of what you can eat and drink. Contact a food expert for more options. What foods should I avoid? Limiting some of these foods may help in managing the symptoms of GERD. Everyone is different. Talk with a food expert or your doctor to help you find the exact foods to avoid, if any. Fruits Any fruits prepared with added fat. Any fruits that cause symptoms. For some people, this may include citrus fruits, such as oranges, grapefruit, pineapple, and lemons. Vegetables Deep-fried vegetables. [place] fries. Any vegetables prepared with added fat. Any vegetables that cause symptoms. For some people, this may include tomatoes and tomato products, chili peppers,  onions and garlic, and horseradish. Grains Pastries or quick breads with added fat. Meats and other proteins High-fat meats, such as fatty beef or pork, hot dogs, ribs, ham, sausage, salami, and bacon. Fried meat or protein, including fried fish and fried chicken. Nuts and nut butters, in large amounts. Dairy Whole milk and chocolate milk. Sour cream. Cream. Ice cream. Cream cheese. Milkshakes. Fats and oils Butter. Margarine.  Shortening. Ghee. Beverages Coffee and tea, with or without caffeine. Carbonated beverages. Sodas. Energy drinks. Fruit juice made with acidic fruits, such as orange or grapefruit. Tomato juice. Alcoholic drinks. Sweets and desserts Chocolate and cocoa. Donuts. Seasonings and condiments Pepper. Peppermint and spearmint. Added salt. Any condiments, herbs, or seasonings that cause symptoms. For some people, this may include curry, hot sauce, or vinegar-based salad dressings. The items listed above may not be a complete list of what you should not eat and drink. Contact a food expert for more options. Questions to ask your doctor Diet and lifestyle changes are often the first steps that are taken to manage symptoms of GERD. If diet and lifestyle changes do not help, talk with your doctor about taking medicines. Where to find more information International Foundation for Gastrointestinal Disorders: aboutgerd.org Summary When you have GERD, food and lifestyle choices are very important in easing your symptoms. Eat small meals often instead of 3 large meals a day. Eat your meals slowly and in a place where you are relaxed. Avoid bending over or lying down until 2-3 hours after eating. Limit high-fat foods such as fatty meats or fried foods. This information is not intended to replace advice given to you by your health care provider. Make sure you discuss any questions you have with your health care provider. Document Revised: 09/12/2019 Document Reviewed: 09/12/2019 Elsevier Patient Education  [employer].

## 2021-04-25 NOTE — Progress Notes (Signed)
Subjective:    Patient ID: Molly Wall, female    DOB: 02-25-1946, 76 y.o.   MRN: [id number]  HPI  Molly Wall is a very pleasant 76 y.o. female with a history of type 2 diabetes, hyperlipidemia, chronic back pain, constipation, flank pain who presents today to discuss constipation and epigastric burning  Constipation episode began 2-3 weeks ago, had gone five days without a bowel movement despite use of stool softener. She completed an enema at home with evacuation but with continued heaviness to her upper abdomen. Also with epigastric burning and esophageal burning.   Today she endorses bowel movements occur every morning, but are still firm and not her usual movements. She does notice epigastric burning, esophageal burning, and gas, overall improvement. She has a history of GERD and hasn't taken medication in years.   She has been taking both a pre and pro probiotics for the last 1-2 weeks and has noticed improvement in symptoms. She endorses a healthy diet and drinks 32-48 ounces of water daily.   She denies bloody stools, vomiting, decreased activity level. She is leaving for [place] in early March 2023.    BP Readings from Last 3 Encounters:  04/25/21 110/62  04/16/21 (!) 153/71  04/04/21 110/72       Review of Systems  Respiratory:  Negative for shortness of breath.   Cardiovascular:  Negative for chest pain.  Gastrointestinal:  Positive for constipation. Negative for abdominal pain, diarrhea and vomiting.        Past Medical History:  Diagnosis Date   Allergic rhinitis    Allergy    Asthma    Atypical ductal hyperplasia of right breast    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   Breast cancer ([facility])    Diabetes mellitus without complication (HCC)    Family history of breast cancer    Family history of colon cancer    Hypercholesteremia    Hypothyroidism    Lumbar herniated disc    OSA (obstructive sleep apnea)     used to use CPAP, not any more over 1 1/2 yrs   Salivary stone    left side   Sleep apnea    no cpap in 2 yrs    Vitamin D deficiency     Social History   Socioeconomic History   Marital status: Divorced    Spouse name: Not on file   Number of children: 2   Years of education: Not on file   Highest education level: Not on file  Occupational History   Not on file  Tobacco Use   Smoking status: Never   Smokeless tobacco: Never  Vaping Use   Vaping Use: Never used  Substance and Sexual Activity   Alcohol use: Yes    Alcohol/week: 0.0 - 1.0 standard drinks    Comment: 0-1 per month   Drug use: No   Sexual activity: Not Currently  Other Topics Concern   Not on file  Social History Narrative   Single.   Moved from [place] to [place] several weeks ago.   Family lives in [place].   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   Social Determinants of Health   Financial Resource Strain: Not on file  Food Insecurity: Not on file  Transportation Needs: Not on file  Physical Activity: Not on file  Stress: Not on file  Social Connections: Not  on file  Intimate Partner Violence: Not on file    Past Surgical History:  Procedure Laterality Date   APPENDECTOMY     BREAST LUMPECTOMY WITH RADIOACTIVE SEED LOCALIZATION Right 08/12/2017   Procedure: RIGHT BREAST LUMPECTOMY X'S 2 WITH RADIOACTIVE SEED LOCALIZATION X'S 2;  Surgeon: Fanny Skates, MD;  Location: [facility];  Service: General;  Laterality: Right;   COLONOSCOPY     11 yrs ago in [place] but given a 10 yr recall per pt    GANGLION CYST EXCISION     76 years old   [place]  2014   [place]     age 23    Family History  Problem Relation Age of Onset   Alzheimer's disease Mother    Asthma Mother    Dementia Mother        cause of death    Arthritis Father    Heart attack Father 29       cause of death   Breast cancer Sister 68   Healthy Daughter    Colon cancer Paternal Grandmother    Liver cancer Maternal Uncle    Healthy Daughter    Healthy Brother    Healthy Sister    Colon polyps Neg Hx    Rectal cancer Neg Hx    Stomach cancer Neg Hx     Allergies  Allergen Reactions   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 She described it was a small amount in a milk carton in [place]. She states they stopped using it shortly after because the trial wasn't successful. SPM    Current Outpatient Medications on File Prior to Visit  Medication Sig Dispense Refill   albuterol (VENTOLIN HFA) 108 (90 Base) MCG/ACT inhaler INHALE 2 PUFFS BY MOUTH EVERY 6 HOURS AS NEEDED FOR WHEEZE OR SHORTNESS OF BREATH 8 g 0   anastrozole (ARIMIDEX) 1 MG tablet TAKE 1 TABLET BY MOUTH EVERY DAY 90 tablet 4   atorvastatin (LIPITOR) 20 MG tablet TAKE 1 TABLET BY MOUTH EVERY DAY FOR CHOLESTEROL 90 tablet 1   Continuous Blood Gluc Receiver (DEXCOM G6 RECEIVER) DEVI Use to monitor blood sugar 1 each 0   Continuous Blood Gluc Sensor (DEXCOM G6 SENSOR) MISC Use as instructed change sensor every 10 days E11.65 9 each 3   Continuous Blood Gluc Transmit (DEXCOM G6 TRANSMITTER) MISC 1 Device by Does not apply route every 3 (three) months. 1 each 0   fluticasone (FLONASE) 50 MCG/ACT nasal spray INSTILL 1 SPRAY IN EACH NOSTRIL TWICE A DAY AS NEEDED FOR ALLERGIES OR RHINITIS 48 mL 0   fluticasone-salmeterol (WIXELA INHUB) 250-50 MCG/ACT AEPB TAKE 1 PUFF BY MOUTH TWICE A DAY 60 each 11   glucose blood (ONETOUCH VERIO) test strip USE AS DIRECTED TO CHECK FASTING BLOOD SUGARS THREE TIMES DAILY. 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 VIA PUMP 90 mL 3   Insulin Disposable Pump (V-GO  20) KIT Use 1x a day 90 kit 3   Insulin Pen Needle 32G X 4 MM MISC Use 5-6x a day for insulin injections 500 each 3   levothyroxine (SYNTHROID) 88 MCG tablet TAKE 1 AND 1/2 TABLET BY MOUTH EVERY SUNDAY. TAKE 1 TABLET  BY MOUTH MON THROUGH SAT. TAKE ON AN EMPTY STOMACH WITH WATER ONLY. NO FOOD OR OTHER MEDICATIONS FOR 30 MINUTES. 90 tablet 3   metFORMIN (GLUCOPHAGE-XR) 500 MG 24 hr tablet TAKE 2 TABLETS (1,000 MG TOTAL) BY MOUTH DAILY WITH BREAKFAST. FOR DIABETES. 180 tablet 1   montelukast (SINGULAIR) 10 MG tablet TAKE 1 TABLET (10 MG TOTAL) BY MOUTH AT BEDTIME. FOR ALLERGIES AND ASTHMA. 90 tablet 2   OZEMPIC, 0.25 OR 0.5 MG/DOSE, 2 MG/1.5ML SOPN INJECT 0.5 MG INTO THE SKIN ONCE A WEEK. 4.5 mL 3   No current facility-administered medications on file prior to visit.    BP 110/62    Pulse 60    Temp 98.6 F (37 C) (Temporal)    Ht '5\' 4"'  (1.626 m)    Wt 194 lb (88 kg)    SpO2 96%    BMI 33.30 kg/m  Objective:   Physical Exam Constitutional:      General: She is not in acute distress. Cardiovascular:     Rate and Rhythm: Normal rate and regular rhythm.  Pulmonary:     Effort: Pulmonary effort is normal.     Breath sounds: Normal breath sounds.  Abdominal:     General: Bowel sounds are normal.     Palpations: Abdomen is soft.     Tenderness: There is no abdominal tenderness.  Musculoskeletal:     Cervical back: Neck supple.  Skin:    General: Skin is warm and dry.          Assessment & Plan:      This visit occurred during the SARS-CoV-2 public health emergency.  Safety protocols were in place, including screening questions prior to the visit, additional usage of staff PPE, and extensive cleaning of exam room while observing appropriate contact time as indicated for disinfecting solutions.

## 2021-05-01 ENCOUNTER — Other Ambulatory Visit: Payer: Self-pay

## 2021-05-01 ENCOUNTER — Ambulatory Visit
Admission: RE | Admit: 2021-05-01 | Discharge: 2021-05-01 | Disposition: A | Discharge status: Home / Self Care | Payer: Medicare Other | Source: Ambulatory Visit | Attending: Adult Health | Admitting: Adult Health

## 2021-05-01 DIAGNOSIS — Z1231 Encounter for screening mammogram for malignant neoplasm of breast: Secondary | ICD-10-CM

## 2021-05-01 DIAGNOSIS — N6091 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: Secondary | ICD-10-CM

## 2021-05-01 DIAGNOSIS — Z1239 Encounter for other screening for malignant neoplasm of breast: Secondary | ICD-10-CM

## 2021-05-03 ENCOUNTER — Encounter: Payer: Self-pay | Admitting: Internal Medicine

## 2021-06-21 ENCOUNTER — Other Ambulatory Visit: Payer: Self-pay | Admitting: Primary Care

## 2021-06-21 DIAGNOSIS — E119 Type 2 diabetes mellitus without complications: Secondary | ICD-10-CM

## 2021-06-22 ENCOUNTER — Other Ambulatory Visit: Payer: Self-pay | Admitting: Primary Care

## 2021-06-22 DIAGNOSIS — J302 Other seasonal allergic rhinitis: Secondary | ICD-10-CM

## 2021-07-01 ENCOUNTER — Other Ambulatory Visit: Payer: Self-pay

## 2021-07-01 DIAGNOSIS — E1165 Type 2 diabetes mellitus with hyperglycemia: Secondary | ICD-10-CM

## 2021-07-01 MED ORDER — ANASTROZOLE 1 MG PO TABS: 1.0000 mg | ORAL_TABLET | Freq: Every day | ORAL | 3 refills | Status: DC

## 2021-07-01 MED ORDER — DEXCOM G6 SENSOR MISC: 3 refills | Status: DC

## 2021-07-01 MED ORDER — DEXCOM G6 TRANSMITTER MISC: 1.0000 | 3 refills | Status: DC

## 2021-07-01 NOTE — Telephone Encounter (Signed)
Pts daughter requested a refill for Dexcom supplies. Rx sent to preferred pharmacy. ?

## 2021-07-03 ENCOUNTER — Other Ambulatory Visit: Payer: Self-pay | Admitting: Primary Care

## 2021-07-03 DIAGNOSIS — J4520 Mild intermittent asthma, uncomplicated: Secondary | ICD-10-CM

## 2021-07-04 ENCOUNTER — Other Ambulatory Visit: Payer: Self-pay | Admitting: Internal Medicine

## 2021-07-07 ENCOUNTER — Other Ambulatory Visit: Payer: Self-pay | Admitting: Primary Care

## 2021-07-07 DIAGNOSIS — E1165 Type 2 diabetes mellitus with hyperglycemia: Secondary | ICD-10-CM

## 2021-08-08 ENCOUNTER — Encounter: Payer: Self-pay | Admitting: Internal Medicine

## 2021-08-08 ENCOUNTER — Ambulatory Visit (INDEPENDENT_AMBULATORY_CARE_PROVIDER_SITE_OTHER): Payer: Medicare Other | Admitting: Internal Medicine

## 2021-08-08 VITALS — BP 120/76 | HR 72 | Ht 64.0 in | Wt 198.0 lb

## 2021-08-08 DIAGNOSIS — E785 Hyperlipidemia, unspecified: Secondary | ICD-10-CM | POA: Diagnosis not present

## 2021-08-08 DIAGNOSIS — Z794 Long term (current) use of insulin: Secondary | ICD-10-CM

## 2021-08-08 DIAGNOSIS — E66811 Obesity, class 1: Secondary | ICD-10-CM

## 2021-08-08 DIAGNOSIS — E1165 Type 2 diabetes mellitus with hyperglycemia: Secondary | ICD-10-CM | POA: Diagnosis not present

## 2021-08-08 DIAGNOSIS — E669 Obesity, unspecified: Secondary | ICD-10-CM | POA: Diagnosis not present

## 2021-08-08 LAB — POCT GLYCOSYLATED HEMOGLOBIN (HGB A1C): Hemoglobin A1C: 7.4 % — AB (ref 4.0–5.6)

## 2021-08-08 NOTE — Progress Notes (Signed)
Patient ID: Molly Wall, female   DOB: May 06, 1945, 76 y.o.   MRN: [id number]   This visit occurred during the SARS-CoV-2 public health emergency.  Safety protocols were in place, including screening questions prior to the visit, additional usage of staff PPE, and extensive cleaning of exam room while observing appropriate contact time as indicated for disinfecting solutions.   HPI: Molly Wall is a 76 y.o.-year-old female, initially referred by her PCP, Alma Friendly, MD, returning for follow-up for DM2, dx at 62 (2017), h/o reactive hypoglycemia, insulin-dependent, uncontrolled, without long-term complications.  Last visit 4 months ago.  Interim history: She denies increased urination, blurry vision, nausea, chest pain. She has constipation. 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  She was very active there.  Since she has returned, she had to work on Haematologist and was staying late, eating dinners late, having erratic meals.  Sugars have been much more variable  Reviewed HbA1c levels: Lab Results  Component Value Date   HGBA1C 7.2 (A) 04/04/2021   HGBA1C 7.1 (A) 01/01/2021   HGBA1C 6.8 (A) 09/20/2020   HGBA1C 8.1 (A) 06/21/2020   HGBA1C 8.7 (A) 03/19/2020   HGBA1C 8.2 (A) 09/07/2019   HGBA1C 9.4 (H) 06/06/2019   HGBA1C 9.1 (A) 02/08/2019   HGBA1C 9.6 (A) 10/27/2018   HGBA1C 8.2 (A) 07/30/2018   She is on: - Metformin ER 1000 mg with breakfast - Ozempic 0.5 >> 1 mg weekly  - VGo 30 >> ... >> NIO27 with 4-5 clicks before meal and 5-7 clicks before dinner Do not correct high blood sugars at night. If you have a late dinner, please use 5-6 units. If sugars before meals <60, do not take Novolog before that meal. If sugars before meals <90, try to move the bolus right before the meal  Previously on: - Lantus 35 >> 25 units in am >> 25 units in a.m. and 10 units at bedtime >> 20 units in a.m. and 10 units at night -  Novolog 7-9 units before 15 min before meals >> B'fast: 6-8 units  Lunch: 10-11 units Dinner: 6-7 units  She checks her sugars more than 4 times a day with her CGM:   Previously:   Prev.:   Lowest sugar was  40s >> 55 after b'fast today; she has hypoglycemia awareness in the 80s. Highest sugar was 500s ...>> 300s.  Glucometer: One Touch Verio  -No CKD, last BUN/creatinine:  Lab Results  Component Value Date   BUN 15 04/16/2021   BUN 13 09/20/2020   CREATININE 0.67 04/16/2021   CREATININE 0.70 09/20/2020  Not on ACE inhibitor/ARB.  -No HL; last set of lipids: Lab Results  Component Value Date   CHOL 175 09/20/2020   HDL 71.50 09/20/2020   LDLCALC 88 09/20/2020   TRIG 76.0 09/20/2020   CHOLHDL 2 09/20/2020  On Lipitor 20.  - last eye exam was in 01/2021: No DR.   -No numbness and tingling in her feet. Last foot exam: 11/2020.  Pt has FH of DM in [place].  She has OSA and started a CPAP after which she felt much better. 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   She is on levothyroxine 88 mcg 1 tablet 6/7 days and 1.5 tablets  1/7 days.   Latest TSH was normal: Lab Results  Component Value Date   TSH 0.97 11/16/2020   She lives alone.  ROS: + See HPI  I reviewed pt's medications, allergies, PMH, social hx, family hx, and changes were documented in the history of present illness. Otherwise, unchanged from my initial visit note.  Past Medical History:  Diagnosis Date   Allergic rhinitis    Allergy    Asthma    Atypical ductal hyperplasia of right breast    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   Breast cancer ([facility])    Diabetes mellitus without complication ([facility])    Family history of breast cancer    Family history of colon cancer    Hypercholesteremia     Hypothyroidism    Lumbar herniated disc    OSA (obstructive sleep apnea)    used to use CPAP, not any more over 1 1/2 yrs   Salivary stone    left side   Sleep apnea    no cpap in 2 yrs    Vitamin D deficiency    Past Surgical History:  Procedure Laterality Date   APPENDECTOMY     BREAST LUMPECTOMY WITH RADIOACTIVE SEED LOCALIZATION Right 08/12/2017   Procedure: RIGHT BREAST LUMPECTOMY X'S 2 WITH RADIOACTIVE SEED LOCALIZATION X'S 2;  Surgeon: Fanny Skates, MD;  Location: [facility];  Service: General;  Laterality: Right;   COLONOSCOPY     11 yrs ago in [place] but given a 10 yr recall per pt    GANGLION CYST EXCISION     76 years old   [place]  2014   [place]   TONSILLECTOMY     age 76   Social History   Socioeconomic History   Marital status: Divorced    Spouse name: Not on file   Number of children: 2   Years of education: Not on file   Highest education level: Not on file  Occupational History   Not on file  Tobacco Use   Smoking status: Former Smoker    Packs/day: 1.00    Years: 2.00    Pack years: 2.00    Types: Cigarettes   Smokeless tobacco: Never Used   Tobacco comment: quite smoker at age 76  Substance and Sexual Activity   Alcohol use: Yes    Alcohol/week: 0.0 - 1.0 standard drinks    Comment: 0-1 per month   Drug use: No   Sexual activity: Not Currently  Other Topics Concern   Not on file  Social History Narrative   Single.   Moved from [place] to [place].   Family lives in [place].   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   Social Determinants of Health   Financial Resource Strain:    Difficulty of Paying Living Expenses: Not on file  Food Insecurity:    Worried About Charity fundraiser in the Last Year: Not on file   [employer] of Food in the Last Year: Not on file   Transportation Needs:    Lack of Transportation (Medical): Not on file   Lack of Transportation (Non-Medical): Not on file  Physical Activity:    Days of Exercise per Week: Not on file   Minutes of Exercise per Session: Not on file  Stress:    Feeling  of Stress : Not on file  Social Connections:    Frequency of Communication with Friends and Family: Not on file   Frequency of Social Gatherings with Friends and Family: Not on file   Attends Religious Services: Not on file   Active Member of Clubs or Organizations: Not on file   Attends Archivist Meetings: Not on file   Marital Status: Not on file  Intimate Partner Violence:    Fear of Current or Ex-Partner: Not on file   Emotionally Abused: Not on file   Physically Abused: Not on file   Sexually Abused: Not on file   Current Outpatient Medications on File Prior to Visit  Medication Sig Dispense Refill   albuterol (VENTOLIN HFA) 108 (90 Base) MCG/ACT inhaler INHALE 2 PUFFS BY MOUTH EVERY 6 HOURS AS NEEDED FOR WHEEZE OR SHORTNESS OF BREATH 8 g 0   anastrozole (ARIMIDEX) 1 MG tablet Take 1 tablet (1 mg total) by mouth daily. 90 tablet 3   atorvastatin (LIPITOR) 20 MG tablet TAKE 1 TABLET BY MOUTH EVERY DAY FOR CHOLESTEROL 90 tablet 1   Continuous Blood Gluc Receiver (DEXCOM G6 RECEIVER) DEVI Use to monitor blood sugar 1 each 0   Continuous Blood Gluc Sensor (DEXCOM G6 SENSOR) MISC Use as instructed change sensor every 10 days E11.65 9 each 3   Continuous Blood Gluc Transmit (DEXCOM G6 TRANSMITTER) MISC 1 Device by Does not apply route every 3 (three) months. 1 each 3   fluticasone (FLONASE) 50 MCG/ACT nasal spray INSTILL 1 SPRAY IN EACH NOSTRIL TWICE A DAY AS NEEDED FOR ALLERGIES OR RHINITIS 48 mL 0   fluticasone-salmeterol (WIXELA INHUB) 250-50 MCG/ACT AEPB TAKE 1 PUFF BY MOUTH TWICE A DAY 60 each 11   insulin aspart (NOVOLOG) 100 UNIT/ML injection INJECT up to 70 UNITS INTO THE SKIN ONCE FOR 1 DOSE. VIA PUMP 90 mL 3    Insulin Disposable Pump (V-GO 20) KIT Use 1x a day 90 kit 3   Insulin Pen Needle 32G X 4 MM MISC Use 5-6x a day for insulin injections 500 each 3   levothyroxine (SYNTHROID) 88 MCG tablet TAKE 1 AND 1/2 TABLET BY MOUTH EVERY SUNDAY. TAKE 1 TABLET BY MOUTH MON THROUGH SAT. TAKE ON AN EMPTY STOMACH WITH WATER ONLY. NO FOOD OR OTHER MEDICATIONS FOR 30 MINUTES. 90 tablet 3   metFORMIN (GLUCOPHAGE-XR) 500 MG 24 hr tablet TAKE 2 TABLETS (1,000 MG TOTAL) BY MOUTH DAILY WITH BREAKFAST. FOR DIABETES. 180 tablet 1   montelukast (SINGULAIR) 10 MG tablet TAKE 1 TABLET (10 MG TOTAL) BY MOUTH AT BEDTIME. FOR ALLERGIES AND ASTHMA. 90 tablet 2   ONETOUCH VERIO test strip USE AS DIRECTED TO CHECK FASTING BLOOD SUGARS THREE TIMES DAILY. 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 4.5 mL 3   OZEMPIC, 1 MG/DOSE, 4 MG/3ML SOPN INJECT 1MG INTO THE SKIN ONCE A WEEK 9 mL 4   No current facility-administered medications on file prior to visit.   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 She described it was a small amount in a milk carton in [place]. She states they stopped using it shortly after because the trial wasn't successful. SPM   Family History  Problem Relation Age of Onset   Alzheimer's disease Mother  Asthma Mother    Dementia Mother        cause of death   Arthritis Father    Heart attack Father 19       cause of death   Breast cancer Sister 49   Healthy Daughter    Colon cancer Paternal Grandmother    Liver cancer Maternal Uncle    Healthy Daughter    Healthy Brother    Healthy Sister    Colon polyps Neg Hx    Rectal cancer Neg Hx    Stomach cancer Neg Hx    PE: BP 120/76 (BP Location: Right Arm, Patient Position: Sitting, Cuff Size: Normal)   Pulse 72   Ht '5\' 4"'  (1.626 m)   Wt 198 lb (89.8  kg)   SpO2 98%   BMI 33.99 kg/m  Wt Readings from Last 3 Encounters:  08/08/21 198 lb (89.8 kg)  04/25/21 194 lb (88 kg)  04/16/21 191 lb 12.8 oz (87 kg)   Constitutional: overweight, in NAD Eyes: PERRLA, EOMI, no exophthalmos ENT: moist mucous membranes, no thyromegaly, no cervical lymphadenopathy Cardiovascular: RRR, No MRG Respiratory: CTA B Musculoskeletal: no deformities, strength intact in all 4 Skin: moist, warm, no rashes Neurological: no tremor with outstretched hands, DTR normal in all 4  ASSESSMENT: 1. DM2, insulin-dependent, uncontrolled, without long-term complications, but with hyperglycemia  2. HL  3. Obesity class 1  PLAN:  1. Patient with longstanding, insulin-dependent diabetes, on basal/bolus insulin regimen with metformin and GLP-1 receptor agonist, also, and improved control after she started a more plant-based diet, but still very fluctuating blood sugars.  Sugars improved after starting a V-Go patch pump.  At last visit HbA1c was 7.2%, improved.  At that time her sugars were still high after meals and dropping overnight.  She was still correcting sugars at bedtime and I advised her not to do so.  I also advised her to vary more the boluses before meals.  The VGo pump was still expensive for her and we were not able to switch to switch to another pump. CGM interpretation: -At today's visit, we reviewed her CGM downloads: It appears that 54% of values are in target range (goal >70%), while 45% are higher than 180 (goal <25%), and 1% are lower than 70 (goal <4%).  The calculated average blood sugar is 186.  The projected HbA1c for the next 3 months (GMI) is 7.8%. -Reviewing the CGM trends, sugars appear to be more controlled during the day, but they do increase especially after dinner and remain high the entire night.  She is still correcting blood sugars at bedtime and sugars are occasionally plummeting by morning.  She feels that her pattern is consistent with  erratic meals especially at night.  She does not feel that she needs a change in regimen, especially now that she is back from her trip (she arrived 2 weeks ago), and her life is getting back to normal. -We did discuss about possibly increasing Ozempic dose, but she had significant constipation until recently, and for now, I would not suggest to go up on the dose since she just got some relief from this.  I advised her to try not to correct with more than 2-3 units of insulin at night, but otherwise, we can continue the same regimen. - I suggested to:  Patient Instructions  Please continue: - Metformin ER 1000 mg with breakfast - Ozempic 1 mg weekly - [id number] with 4-5 clicks before meal and 5-7 clicks before dinner  Do not correct high blood sugars at bedtime. If you have a late dinner, please use 5-6 units. If sugars before meals <60, do not take Novolog before that meal. If sugars before meals <90, try to move the bolus right before the meal  Please return in 3-4 months.   - we checked her HbA1c: 7.4% (higher) - advised to check sugars at different times of the day - 4x a day, rotating check times - advised for yearly eye exams >> she is UTD - return to clinic in 3-4 months  2. HL -Reviewed latest lipid panel from 09/2020: Fractions at goal with the exception of a slightly high LDL, above our goal of less than 70 Lab Results  Component Value Date   CHOL 175 09/20/2020   HDL 71.50 09/20/2020   LDLCALC 88 09/20/2020   TRIG 76.0 09/20/2020   CHOLHDL 2 09/20/2020  -She continues on Lipitor 20 mg daily without side effects  3.  Obesity class I -She continues on Ozempic, which is also help with weight loss -She follows a more plant-based diet -She lost 18 pounds before the last 3 visits combined (1 pound before last visit)  Philemon Kingdom, MD PhD [facility] Endocrinology

## 2021-08-08 NOTE — Patient Instructions (Addendum)
Please continue: - Metformin ER 1000 mg with breakfast - Ozempic 1 mg weekly - [id number] with 4-5 clicks before meal and 5-7 clicks before dinner Do not correct high blood sugars at bedtime. If you have a late dinner, please use 5-6 units. If sugars before meals <60, do not take Novolog before that meal. If sugars before meals <90, try to move the bolus right before the meal  Please return in 3-4 months.

## 2021-08-27 ENCOUNTER — Other Ambulatory Visit: Payer: Self-pay | Admitting: Primary Care

## 2021-08-27 DIAGNOSIS — E785 Hyperlipidemia, unspecified: Secondary | ICD-10-CM

## 2021-09-26 ENCOUNTER — Other Ambulatory Visit: Payer: Self-pay | Admitting: Primary Care

## 2021-09-26 DIAGNOSIS — J302 Other seasonal allergic rhinitis: Secondary | ICD-10-CM

## 2021-10-11 ENCOUNTER — Other Ambulatory Visit: Payer: Self-pay

## 2021-10-11 DIAGNOSIS — N6091 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: Secondary | ICD-10-CM

## 2021-10-15 ENCOUNTER — Inpatient Hospital Stay (HOSPITAL_BASED_OUTPATIENT_CLINIC_OR_DEPARTMENT_OTHER): Payer: Medicare Other | Admitting: Adult Health

## 2021-10-15 ENCOUNTER — Inpatient Hospital Stay: Payer: Medicare Other | Attending: Adult Health

## 2021-10-15 ENCOUNTER — Other Ambulatory Visit: Payer: Self-pay

## 2021-10-15 ENCOUNTER — Encounter: Payer: Self-pay | Admitting: Adult Health

## 2021-10-15 VITALS — BP 142/70 | HR 72 | Temp 97.7°F | Resp 18 | Ht 64.0 in | Wt 203.0 lb

## 2021-10-15 DIAGNOSIS — Z79899 Other long term (current) drug therapy: Secondary | ICD-10-CM | POA: Insufficient documentation

## 2021-10-15 DIAGNOSIS — M8580 Other specified disorders of bone density and structure, unspecified site: Secondary | ICD-10-CM | POA: Insufficient documentation

## 2021-10-15 DIAGNOSIS — N6091 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: Secondary | ICD-10-CM

## 2021-10-15 DIAGNOSIS — E2839 Other primary ovarian failure: Secondary | ICD-10-CM | POA: Diagnosis not present

## 2021-10-15 LAB — CBC WITH DIFFERENTIAL (CANCER CENTER ONLY)
Abs Immature Granulocytes: 0.01 10*3/uL (ref 0.00–0.07)
Basophils Absolute: 0.1 10*3/uL (ref 0.0–0.1)
Basophils Relative: 1 %
Eosinophils Absolute: 0.3 10*3/uL (ref 0.0–0.5)
Eosinophils Relative: 5 %
HCT: 42.4 % (ref 36.0–46.0)
Hemoglobin: 14.1 g/dL (ref 12.0–15.0)
Immature Granulocytes: 0 %
Lymphocytes Relative: 29 %
Lymphs Abs: 1.4 10*3/uL (ref 0.7–4.0)
MCH: 28.5 pg (ref 26.0–34.0)
MCHC: 33.3 g/dL (ref 30.0–36.0)
MCV: 85.8 fL (ref 80.0–100.0)
Monocytes Absolute: 0.4 10*3/uL (ref 0.1–1.0)
Monocytes Relative: 7 %
Neutro Abs: 2.8 10*3/uL (ref 1.7–7.7)
Neutrophils Relative %: 58 %
Platelet Count: 236 10*3/uL (ref 150–400)
RBC: 4.94 MIL/uL (ref 3.87–5.11)
RDW: 12.8 % (ref 11.5–15.5)
WBC Count: 4.9 10*3/uL (ref 4.0–10.5)
nRBC: 0 % (ref 0.0–0.2)

## 2021-10-15 LAB — CMP (CANCER CENTER ONLY)
ALT: 11 U/L (ref 0–44)
AST: 15 U/L (ref 15–41)
Albumin: 4.4 g/dL (ref 3.5–5.0)
Alkaline Phosphatase: 87 U/L (ref 38–126)
Anion gap: 6 (ref 5–15)
BUN: 14 mg/dL (ref 8–23)
CO2: 31 mmol/L (ref 22–32)
Calcium: 9.7 mg/dL (ref 8.9–10.3)
Chloride: 101 mmol/L (ref 98–111)
Creatinine: 0.78 mg/dL (ref 0.44–1.00)
GFR, Estimated: >60 mL/min (ref >60)
Glucose, Bld: 98 mg/dL (ref 70–99)
Potassium: 4.4 mmol/L (ref 3.5–5.1)
Sodium: 138 mmol/L (ref 135–145)
Total Bilirubin: 0.5 mg/dL (ref 0.3–1.2)
Total Protein: 7.3 g/dL (ref 6.5–8.1)

## 2021-10-15 NOTE — Progress Notes (Signed)
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 STK11, TP53, TSC1, TSC2, and VHL.  The following genes were evaluated for sequence changes only: SDHA and HOXB13 c.251G>A variant only.   (3) breast cancer high risk: intensified screening:             (a) yearly mammography in May             (b) yearly breast MRI in November             (c) biannual MD breast exam  Per: [email or web address]   (4) breast cancer high risk: risk reduction             (a) anastrozole started 01/05/2018  Per: [email or web address]  CURRENT THERAPY: Anastrozole  INTERVAL HISTORY: Molly Wall 76 y.o. female returns for follow-up and evaluation of her history of increased breast cancer risk due to atypical ductal hyperplasia and atypical hyperplasia that was diagnosed in April 2019.  She continues on anastrozole daily with good tolerance.  Her most recent bone density test was completed on May 08, 2020 and showed mild osteopenia with a T score of  -1.8.  [name] underwent a bilateral breast screening mammogram on May 01, 2021 that showed no mammographic evidence of malignancy and breast density category B.  She is scheduled for a breast MRI later this month.   Patient Active Problem List   Diagnosis Date Noted  . GERD (gastroesophageal reflux disease) 04/25/2021  . Vegan diet 02/21/2020  . Word finding difficulty 02/21/2020  . Acute non-recurrent sinusitis 02/21/2020  . Arthralgia of both hands 11/29/2019  . Seasonal allergic rhinitis 11/29/2019  . Chronic knee pain (intermittent) (Right) 11/01/2018  . Chronic pain syndrome 10/31/2018  . Flank pain 10/13/2018  . Shortness of breath 10/13/2018  . History iodine allergy 09/28/2018    Class: History of  . DDD (degenerative disc disease), lumbar 09/27/2018  . Grade 1 Anterolisthesis of L5/S1 09/16/2018  . Lumbar facet arthropathy 09/16/2018  . Arthropathy of lumbosacral facet joint 09/16/2018  . Sialadenitis 08/24/2018  . Genetic testing 11/27/2017  . Atypical lobular hyperplasia [facility]) of right breast 11/06/2017  . Family history of breast cancer   . Family history of colon cancer   . 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 Constipation 04/13/2017  . Chronic low back pain (Secondary area of Pain) (Bilateral) (R>L) 02/13/2016  . Chronic lower extremity pain (Primary Area of Pain) (Right) 02/13/2016  . Chronic lumbar radicular pain (Right) 02/13/2016  . Type 2 diabetes mellitus with hyperglycemia, with long-term current use of insulin ([place]) 07/18/2015  . Preventative health care 07/18/2015  . Vitamin D deficiency 07/18/2015  . Hyperlipidemia 12/13/2014  . Asthma, chronic 12/13/2014  . Hypothyroidism 12/13/2014  .  Lumbar herniated disc 12/13/2014    is allergic to iodine-131.  MEDICAL HISTORY: Past Medical History:  Diagnosis Date  . Allergic rhinitis   . Allergy   . Asthma   . Atypical ductal hyperplasia of right breast   . Atypical ductal  hyperplasia of right breast 08/12/2017  . Breast cancer ([place])   . Diabetes mellitus without complication ([place])   . Family history of breast cancer   . Family history of colon cancer   . Hypercholesteremia   . Hypothyroidism   . Lumbar herniated disc   . OSA (obstructive sleep apnea)    used to use CPAP, not any more over 1 1/2 yrs  . Salivary stone    left side  . Sleep apnea    no cpap in 2 yrs   . Vitamin D deficiency     SURGICAL HISTORY: Past Surgical History:  Procedure Laterality Date  . APPENDECTOMY    . BREAST LUMPECTOMY WITH RADIOACTIVE SEED LOCALIZATION Right 08/12/2017   Procedure: RIGHT BREAST LUMPECTOMY X'S 2 WITH RADIOACTIVE SEED LOCALIZATION X'S 2;  Surgeon: Fanny Skates, MD;  Location: [facility];  Service: General;  Laterality: Right;  . COLONOSCOPY     11 yrs ago in [place] but given a 10 yr recall per pt   . GANGLION CYST EXCISION     76 years old  . INCISION AND DRAINAGE ABSCESS ANAL    . KNEE SURGERY  2014  . [place]  . TONSILLECTOMY     age 49    SOCIAL HISTORY: Social History   Socioeconomic History  . Marital status: Divorced    Spouse name: Not on file  . Number of children: 2  . Years of education: Not on file  . Highest education level: Not on file  Occupational History  . Not on file  Tobacco Use  . Smoking status: Never  . Smokeless tobacco: Never  Vaping Use  . Vaping Use: Never used  Substance and Sexual Activity  . Alcohol use: Yes    Alcohol/week: 0.0 - 1.0 standard drinks of alcohol    Comment: 0-1 per month  . Drug use: No  . Sexual activity: Not Currently  Other Topics Concern  . Not on file  Social History Narrative   Single.   Moved from [place] to [place] several weeks ago.   Family lives in [place].   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   Social Determinants of Health   Financial  Resource Strain: Not on file  Food Insecurity: Not on file  Transportation Needs: Not on file  Physical Activity: Not on file  Stress: Not on file  Social Connections: Not on file  Intimate Partner Violence: Not on file    FAMILY HISTORY: Family History  Problem Relation Age of Onset  . Alzheimer's disease Mother   . Asthma Mother   . Dementia Mother        cause of death  . Arthritis Father   . Heart attack Father 70       cause of death  . Breast cancer Sister 66  . Healthy Daughter   . Colon cancer Paternal Grandmother   . Liver cancer Maternal Uncle   . Healthy Daughter   . Healthy Brother   . Healthy Sister   . Colon polyps Neg Hx   . Rectal cancer Neg Hx   .  Stomach cancer Neg Hx     Review of Systems - Oncology    PHYSICAL EXAMINATION  ECOG PERFORMANCE STATUS: {CHL ONC ECOG VE:[id number]}  Vitals:   10/15/21 1324  BP: (!) 142/70  Pulse: 72  Resp: 18  Temp: 97.7 F (36.5 C)  SpO2: 98%    Physical Exam  LABORATORY DATA:  CBC    Component Value Date/Time   WBC 4.9 10/15/2021 1310   WBC 5.9 02/21/2020 1434   RBC 4.94 10/15/2021 1310   HGB 14.1 10/15/2021 1310   HCT 42.4 10/15/2021 1310   PLT 236 10/15/2021 1310   MCV 85.8 10/15/2021 1310   MCH 28.5 10/15/2021 1310   MCHC 33.3 10/15/2021 1310   RDW 12.8 10/15/2021 1310   LYMPHSABS 1.4 10/15/2021 1310   MONOABS 0.4 10/15/2021 1310   EOSABS 0.3 10/15/2021 1310   BASOSABS 0.1 10/15/2021 1310    CMP     Component Value Date/Time   NA 138 10/15/2021 1310   K 4.4 10/15/2021 1310   CL 101 10/15/2021 1310   CO2 31 10/15/2021 1310   GLUCOSE 98 10/15/2021 1310   BUN 14 10/15/2021 1310   CREATININE 0.78 10/15/2021 1310   CALCIUM 9.7 10/15/2021 1310   PROT 7.3 10/15/2021 1310   ALBUMIN 4.4 10/15/2021 1310   AST 15 10/15/2021 1310   ALT 11 10/15/2021 1310   ALKPHOS 87 10/15/2021 1310   BILITOT 0.5 10/15/2021 1310   GFRNONAA >60 10/15/2021 1310   GFRAA >60 01/06/2019 1416        PENDING LABS:   RADIOGRAPHIC STUDIES:  No results found.   PATHOLOGY:     ASSESSMENT and THERAPY PLAN:   No problem-specific Assessment & Plan notes found for this encounter.   No orders of the defined types were placed in this encounter.   All questions were answered. The patient knows to call the clinic with any problems, questions or concerns. We can certainly see the patient much sooner if necessary. This note was electronically signed. Scot Dock, NP 10/15/2021

## 2021-10-17 NOTE — Assessment & Plan Note (Signed)
Molly Wall is a 76 year old woman here today for f/u of her increased breast cancer risk.  She has no signs of cancer during today's visit and continues on Anastrozole with good tolerance.  I recommended she continue with anastrozole, and intensified breast cancer screening with breast MRI and mammogram.  We discussed modifiable risk reduction through healthy diet and exercise which she will continue.     Molly Wall will return in 6 months for f/u.  She will be due for bone density around that time as well.

## 2021-10-18 ENCOUNTER — Ambulatory Visit (INDEPENDENT_AMBULATORY_CARE_PROVIDER_SITE_OTHER): Payer: Medicare Other

## 2021-10-18 ENCOUNTER — Other Ambulatory Visit: Payer: Self-pay | Admitting: Internal Medicine

## 2021-10-18 VITALS — Ht 65.0 in | Wt 204.0 lb

## 2021-10-18 DIAGNOSIS — Z0000 Encounter for general adult medical examination without abnormal findings: Secondary | ICD-10-CM

## 2021-10-18 NOTE — Progress Notes (Signed)
I connected with Molly Wall today by telephone and verified that I am speaking with the correct person using two identifiers. Location patient: home Location provider: work Persons participating in the virtual visit: [employer] Lee, Glenna Durand LPN.   I discussed the limitations, risks, security and privacy concerns of performing an evaluation and management service by telephone and the availability of in person appointments. I also discussed with the patient that there may be a patient responsible charge related to this service. The patient expressed understanding and verbally consented to this telephonic visit.    Interactive audio and video telecommunications were attempted between this provider and patient, however failed, due to patient having technical difficulties OR patient did not have access to video capability.  We continued and completed visit with audio only.     Vital signs may be patient reported or missing.  Subjective:   Molly Wall is a 76 y.o. female who presents for an Initial Medicare Annual Wellness Visit.  Review of Systems     Cardiac Risk Factors include: advanced age (>79mn, >>81women);diabetes mellitus;dyslipidemia;obesity (BMI >30kg/m2)     Objective:    Today's Vitals   10/18/21 0827  Weight: 204 lb (92.5 kg)  Height: 5' 5" (1.651 m)   Body mass index is 33.95 kg/m.     10/18/2021    8:38 AM 05/17/2018    1:43 PM 09/15/2017    7:46 AM 08/12/2017    8:32 AM 08/05/2017    3:16 PM 06/12/2016    2:11 PM 04/09/2016    9:06 AM  Advanced Directives  Does Patient Have a Medical Advance Directive? Yes Yes Yes No No Yes Yes  Type of [place] Living will;Healthcare Power of Attorney Living will;Healthcare Power of Attorney   Living will;Healthcare Power of [place]  Copy of [place]? Yes - validated most recent  copy scanned in chart (See row information)  No - copy requested      Would patient like information on creating a medical advance directive?     Yes (MAU/Ambulatory/Procedural Areas - Information given)      Current Medications (verified) Outpatient Encounter Medications as of 10/18/2021  Medication Sig   albuterol (VENTOLIN HFA) 108 (90 Base) MCG/ACT inhaler INHALE 2 PUFFS BY MOUTH EVERY 6 HOURS AS NEEDED FOR WHEEZE OR SHORTNESS OF BREATH   anastrozole (ARIMIDEX) 1 MG tablet Take 1 tablet (1 mg total) by mouth daily.   atorvastatin (LIPITOR) 20 MG tablet TAKE 1 TABLET BY MOUTH EVERY DAY FOR CHOLESTEROL. Office visit required for further refills.   Continuous Blood Gluc Receiver (DEXCOM G6 RECEIVER) DEVI Use to monitor blood sugar   Continuous Blood Gluc Sensor (DEXCOM G6 SENSOR) MISC Use as instructed change sensor every 10 days E11.65   Continuous Blood Gluc Transmit (DEXCOM G6 TRANSMITTER) MISC 1 Device by Does not apply route every 3 (three) months.   fluticasone (FLONASE) 50 MCG/ACT nasal spray INSTILL 1 SPRAY IN EACH NOSTRIL TWICE A DAY AS NEEDED FOR ALLERGIES OR RHINITIS   fluticasone-salmeterol (WIXELA INHUB) 250-50 MCG/ACT AEPB TAKE 1 PUFF BY MOUTH TWICE A DAY   insulin aspart (NOVOLOG) 100 UNIT/ML injection INJECT up to 70 UNITS INTO THE SKIN ONCE FOR 1 DOSE. VIA PUMP   Insulin Disposable Pump (V-GO 20) KIT Use 1x a day   levothyroxine (SYNTHROID) 88 MCG tablet TAKE 1 AND 1/2 TABLET BY MOUTH EVERY SUNDAY. TAKE 1  TABLET BY MOUTH MON THROUGH SAT. TAKE ON AN EMPTY STOMACH WITH WATER ONLY. NO FOOD OR OTHER MEDICATIONS FOR 30 MINUTES.   metFORMIN (GLUCOPHAGE-XR) 500 MG 24 hr tablet TAKE 2 TABLETS (1,000 MG TOTAL) BY MOUTH DAILY WITH BREAKFAST. FOR DIABETES.   montelukast (SINGULAIR) 10 MG tablet TAKE 1 TABLET (10 MG TOTAL) BY MOUTH AT BEDTIME. FOR ALLERGIES AND ASTHMA.   ONETOUCH VERIO test strip USE AS DIRECTED TO CHECK FASTING BLOOD SUGARS THREE TIMES DAILY. 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    Allergies (verified) Iodine-131   History: Past Medical History:  Diagnosis Date   Allergic rhinitis    Allergy    Asthma    Atypical ductal hyperplasia of right breast    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   Breast cancer ([place])    Diabetes mellitus without complication ([place])    Family history of breast cancer    Family history of colon cancer    Hypercholesteremia    Hypothyroidism    Lumbar herniated disc    OSA (obstructive sleep apnea)    used to use CPAP, not any more over 1 1/2 yrs   Salivary stone    left side   Sleep apnea    no cpap in 2 yrs    Vitamin D deficiency    Past Surgical History:  Procedure Laterality Date   APPENDECTOMY     BREAST LUMPECTOMY WITH RADIOACTIVE SEED LOCALIZATION Right 08/12/2017   Procedure: RIGHT BREAST LUMPECTOMY X'S 2 WITH RADIOACTIVE SEED LOCALIZATION X'S 2;  Surgeon: Fanny Skates, MD;  Location: [facility];  Service: General;  Laterality: Right;   COLONOSCOPY     11 yrs ago in [place] - polyps but given a 10 yr recall per pt    GANGLION CYST EXCISION     76 years old   [place]  2014   [place]     age 65   Family History  Problem Relation Age of Onset   Alzheimer's disease Mother    Asthma Mother    Dementia Mother        cause of death   Arthritis Father    Heart attack Father 52       cause of death   Breast cancer Sister 62   Healthy Daughter    Colon cancer Paternal Grandmother    Liver cancer Maternal Uncle    Healthy Daughter    Healthy Brother    Healthy Sister    Colon polyps Neg Hx    Rectal cancer Neg Hx    Stomach cancer Neg Hx    Social History   Socioeconomic History   Marital status: Divorced    Spouse name: Not on file   Number of  children: 2   Years of education: Not on file   Highest education level: Not on file  Occupational History   Not on file  Tobacco Use   Smoking status: Never   Smokeless tobacco: Never  Vaping Use   Vaping Use: Never used  Substance and Sexual Activity   Alcohol use: Yes    Alcohol/week: 0.0 - 1.0 standard drinks of alcohol    Comment: 0-1 per month   Drug use: No   Sexual activity:  Not Currently  Other Topics Concern   Not on file  Social History Narrative   Single.   Moved from [place] to [place] several weeks ago.   Family lives in [place].   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   Social Determinants of Health   Financial Resource Strain: Low Risk  (10/18/2021)   Overall Financial Resource Strain (CARDIA)    Difficulty of Paying Living Expenses: Not hard at all  Food Insecurity: No Food Insecurity (10/18/2021)   Hunger Vital Sign    Worried About Running Out of Food in the Last Year: Never true    Ran Out of Food in the Last Year: Never true  Transportation Needs: No Transportation Needs (10/18/2021)   PRAPARE - Hydrologist (Medical): No    Lack of Transportation (Non-Medical): No  Physical Activity: Inactive (10/18/2021)   Exercise Vital Sign    Days of Exercise per Week: 0 days    Minutes of Exercise per Session: 0 min  Stress: No Stress Concern Present (10/18/2021)   [place]    Feeling of Stress : Not at all  Social Connections: Not on file    Tobacco Counseling Counseling given: Not Answered   Clinical Intake:  Pre-visit preparation completed: Yes  Pain : No/denies pain     Nutritional Status: BMI > 30  Obese Nutritional Risks: None Diabetes: Yes  How often do you need to have someone help you when you read instructions, pamphlets, or other written materials from your doctor  or pharmacy?: 1 - Never What is the last grade level you completed in school?: masters degree  Diabetic? Yes Nutrition Risk Assessment:  Has the patient had any N/V/D within the last 2 months?  No  Does the patient have any non-healing wounds?  No  Has the patient had any unintentional weight loss or weight gain?  Yes   Diabetes:  Is the patient diabetic?  Yes  If diabetic, was a CBG obtained today?  No  Did the patient bring in their glucometer from home?  No  How often do you monitor your CBG's? constantly.   Financial Strains and Diabetes Management:  Are you having any financial strains with the device, your supplies or your medication? No .  Does the patient want to be seen by Chronic Care Management for management of their diabetes?  No  Would the patient like to be referred to a Nutritionist or for Diabetic Management?  No   Diabetic Exams:  Diabetic Eye Exam: Completed 01/21/2021 Diabetic Foot Exam: Completed 11/16/2020   Interpreter Needed?: No  Information entered by :: NAllen LPN   Activities of Daily Living    10/18/2021    8:40 AM 10/17/2021   12:43 PM  In your present state of health, do you have any difficulty performing the following activities:  Hearing? 0 0  Vision? 0 0  Difficulty concentrating or making decisions? 0 0  Walking or climbing stairs? 0 0  Dressing or bathing? 0 0  Doing errands, shopping? 0 0  Preparing Food and eating ? N N  Using the Toilet? N N  In the past six months, have you accidently leaked urine? N N  Do you have problems with loss of bowel control? N N  Managing your Medications? N N  Managing your Finances? N N  Housekeeping or  managing your Housekeeping? N N    Patient Care Team: [name], NP as PCP - General (Nurse Practitioner) Doran Stabler, MD as Consulting Physician (Gastroenterology) Delice Bison Charlestine Massed, NP as Nurse Practitioner (Hematology and Oncology)  Indicate any recent Medical Services  you may have received from other than [facility] providers in the past year (date may be approximate).     Assessment:   This is a routine wellness examination for [place].  Hearing/Vision screen Vision Screening - Comments:: Regular eye exams, Dr. Ellin Mayhew  Dietary issues and exercise activities discussed: Current Exercise Habits: The patient does not participate in regular exercise at present   Goals Addressed             This Visit's Progress    Patient Stated       10/18/2021, get diabetes under control       Depression Screen    10/18/2021    8:40 AM 04/25/2021    2:58 PM 11/29/2019   10:33 AM 05/17/2018    1:43 PM 06/12/2016    2:23 PM 04/09/2016    9:13 AM 03/26/2016    1:52 PM  PHQ 2/9 Scores  PHQ - 2 Score 0 0 0 0 0 0 0  PHQ- 9 Score  0 0        Fall Risk    10/18/2021    8:40 AM 10/17/2021   12:43 PM 04/25/2021    2:57 PM 11/29/2019   10:33 AM 07/08/2018    9:28 AM  [place] in the past year? 0 0 1 0 0  Number falls in past yr: 0  0 0   Injury with Fall? 0  1    Risk for fall due to : Medication side effect;Impaired balance/gait  No Fall Risks    Follow up Falls evaluation completed;Education provided;Falls prevention discussed        FALL RISK PREVENTION PERTAINING TO THE HOME:  Any stairs in or around the home? Yes  If so, are there any without handrails? No  Home free of loose throw rugs in walkways, pet beds, electrical cords, etc? Yes  Adequate lighting in your home to reduce risk of falls? Yes   ASSISTIVE DEVICES UTILIZED TO PREVENT FALLS:  Life alert? No  Use of a cane, walker or w/c? No  Grab bars in the bathroom? No  Shower chair or bench in shower? No  Elevated toilet seat or a handicapped toilet? Yes   TIMED UP AND GO:  Was the test performed? No .       Cognitive Function:        10/18/2021    8:41 AM  6CIT Screen  What Year? 0 points  What month? 0 points  What time? 0 points  Count back from 20 0 points  Months in reverse 0  points  Repeat phrase 8 points  Total Score 8 points    Immunizations Immunization History  Administered Date(s) Administered   Fluad Quad(high Dose 65+) 12/01/2018, 11/29/2019   Influenza,inj,Quad PF,6+ Mos 01/19/2015, 12/28/2015, 12/25/2016, 12/09/2017   Pneumococcal Conjugate-13 12/01/2018   Pneumococcal Polysaccharide-23 03/17/2014   Zoster, Live 07/18/2015    TDAP status: Due, Education has been provided regarding the importance of this vaccine. Advised may receive this vaccine at local pharmacy or Health Dept. Aware to provide a copy of the vaccination record if obtained from local pharmacy or Health Dept. Verbalized acceptance and understanding.  Flu Vaccine status: Declined, Education has been  provided regarding the importance of this vaccine but patient still declined. Advised may receive this vaccine at local pharmacy or Health Dept. Aware to provide a copy of the vaccination record if obtained from local pharmacy or Health Dept. Verbalized acceptance and understanding.  Pneumococcal vaccine status: Up to date  Covid-19 vaccine status: Completed vaccines  Qualifies for Shingles Vaccine? Yes   Zostavax completed Yes   Shingrix Completed?: No.    Education has been provided regarding the importance of this vaccine. Patient has been advised to call insurance company to determine out of pocket expense if they have not yet received this vaccine. Advised may also receive vaccine at local pharmacy or Health Dept. Verbalized acceptance and understanding.  Screening Tests Health Maintenance  Topic Date Due   TETANUS/TDAP  Never done   Zoster Vaccines- Shingrix (1 of 2) Never done   URINE MICROALBUMIN  09/20/2021   INFLUENZA VACCINE  10/15/2021   FOOT EXAM  11/16/2021   OPHTHALMOLOGY EXAM  01/21/2022   HEMOGLOBIN A1C  02/08/2022   MAMMOGRAM  05/01/2022   COLONOSCOPY (Pts 45-30yr Insurance coverage will need to be confirmed)  09/16/2022   Pneumonia Vaccine 76 Years old   Completed   DEXA SCAN  Completed   Hepatitis C Screening  Completed   HPV VACCINES  Aged Out   COVID-19 Vaccine  Discontinued    Health Maintenance  Health Maintenance Due  Topic Date Due   TETANUS/TDAP  Never done   Zoster Vaccines- Shingrix (1 of 2) Never done   URINE MICROALBUMIN  09/20/2021   INFLUENZA VACCINE  10/15/2021    Colorectal cancer screening: Type of screening: Colonoscopy. Completed 09/15/2017. Repeat every 5 years  Mammogram status: Completed 05/01/2021. Repeat every year  Bone Density status: Completed 05/07/2020.   Lung Cancer Screening: (Low Dose CT Chest recommended if Age 74-80years, 30 pack-year currently smoking OR have quit w/in 15years.) does not qualify.   Lung Cancer Screening Referral: no  Additional Screening:  Hepatitis C Screening: does qualify; Completed 11/16/2020  Vision Screening: Recommended annual ophthalmology exams for early detection of glaucoma and other disorders of the eye. Is the patient up to date with their annual eye exam?  Yes  Who is the provider or what is the name of the office in which the patient attends annual eye exams? Dr. WEllin MayhewIf pt is not established with a provider, would they like to be referred to a provider to establish care? No .   Dental Screening: Recommended annual dental exams for proper oral hygiene  Community Resource Referral / Chronic Care Management: CRR required this visit?  No   CCM required this visit?  No      Plan:     I have personally reviewed and noted the following in the patient's chart:   Medical and social history Use of alcohol, tobacco or illicit drugs  Current medications and supplements including opioid prescriptions. Patient is not currently taking opioid prescriptions. Functional ability and status Nutritional status Physical activity Advanced directives List of other physicians Hospitalizations, surgeries, and ER visits in previous 12 months Vitals Screenings to  include cognitive, depression, and falls Referrals and appointments  In addition, I have reviewed and discussed with patient certain preventive protocols, quality metrics, and best practice recommendations. A written personalized care plan for preventive services as well as general preventive health recommendations were provided to patient.     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 Appointment made with Anda Kraft.  Due to this being a virtual visit, the after visit summary with patients personalized plan was offered to patient via mail or my-chart. Patient would like to access on my-chart

## 2021-10-18 NOTE — Patient Instructions (Signed)
[name] , Thank you for taking time to come for your Medicare Wellness Visit. I appreciate your ongoing commitment to your health goals. Please review the following plan we discussed and let me know if I can assist you in the future.   Screening recommendations/referrals: Colonoscopy: completed 09/15/2017, due 09/16/2022 Mammogram: completed 05/01/2021 Bone Density: completed 05/07/2020 Recommended yearly ophthalmology/optometry visit for glaucoma screening and checkup Recommended yearly dental visit for hygiene and checkup  Vaccinations: Influenza vaccine: decline Pneumococcal vaccine: completed 12/01/2018 Tdap vaccine: due Shingles vaccine: discussed   Covid-19: decline  Advanced directives: copy in chart  Conditions/risks identified: none  Next appointment: Follow up in one year for your annual wellness visit    Preventive Care 74 Years and Older, Female Preventive care refers to lifestyle choices and visits with your health care provider that can promote health and wellness. What does preventive care include? A yearly physical exam. This is also called an annual well check. Dental exams once or twice a year. Routine eye exams. Ask your health care provider how often you should have your eyes checked. Personal lifestyle choices, including: Daily care of your teeth and gums. Regular physical activity. Eating a healthy diet. Avoiding tobacco and drug use. Limiting alcohol use. Practicing safe sex. Taking low-dose aspirin every day. Taking vitamin and mineral supplements as recommended by your health care provider. What happens during an annual well check? The services and screenings done by your health care provider during your annual well check will depend on your age, overall health, lifestyle risk factors, and family history of disease. Counseling  Your health care provider may ask you questions about your: Alcohol use. Tobacco use. Drug use. Emotional  well-being. Home and relationship well-being. Sexual activity. Eating habits. History of falls. Memory and ability to understand (cognition). Work and work Statistician. Reproductive health. Screening  You may have the following tests or measurements: Height, weight, and BMI. Blood pressure. Lipid and cholesterol levels. These may be checked every 5 years, or more frequently if you are over 9 years old. Skin check. Lung cancer screening. You may have this screening every year starting at age 74 if you have a 30-pack-year history of smoking and currently smoke or have quit within the past 15 years. Fecal occult blood test (FOBT) of the stool. You may have this test every year starting at age 54. Flexible sigmoidoscopy or colonoscopy. You may have a sigmoidoscopy every 5 years or a colonoscopy every 10 years starting at age 74. Hepatitis C blood test. Hepatitis B blood test. Sexually transmitted disease (STD) testing. Diabetes screening. This is done by checking your blood sugar (glucose) after you have not eaten for a while (fasting). You may have this done every 1-3 years. Bone density scan. This is done to screen for osteoporosis. You may have this done starting at age 74. Mammogram. This may be done every 1-2 years. Talk to your health care provider about how often you should have regular mammograms. Talk with your health care provider about your test results, treatment options, and if necessary, the need for more tests. Vaccines  Your health care provider may recommend certain vaccines, such as: Influenza vaccine. This is recommended every year. Tetanus, diphtheria, and acellular pertussis (Tdap, Td) vaccine. You may need a Td booster every 10 years. Zoster vaccine. You may need this after age 74. Pneumococcal 13-valent conjugate (PCV13) vaccine. One dose is recommended after age 74. Pneumococcal polysaccharide (PPSV23) vaccine. One dose is recommended after age 74. Talk to  your  health care provider about which screenings and vaccines you need and how often you need them. This information is not intended to replace advice given to you by your health care provider. Make sure you discuss any questions you have with your health care provider. Document Released: 03/30/2015 Document Revised: 11/21/2015 Document Reviewed: 01/02/2015 Elsevier Interactive Patient Education  2017 [place] Prevention in the Home Falls can cause injuries. They can happen to people of all ages. There are many things you can do to make your home safe and to help prevent falls. What can I do on the outside of my home? Regularly fix the edges of walkways and driveways and fix any cracks. Remove anything that might make you trip as you walk through a door, such as a raised step or threshold. Trim any bushes or trees on the path to your home. Use bright outdoor lighting. Clear any walking paths of anything that might make someone trip, such as rocks or tools. Regularly check to see if handrails are loose or broken. Make sure that both sides of any steps have handrails. Any raised decks and porches should have guardrails on the edges. Have any leaves, snow, or ice cleared regularly. Use sand or salt on walking paths during winter. Clean up any spills in your garage right away. This includes oil or grease spills. What can I do in the bathroom? Use night lights. Install grab bars by the toilet and in the tub and shower. Do not use towel bars as grab bars. Use non-skid mats or decals in the tub or shower. If you need to sit down in the shower, use a plastic, non-slip stool. Keep the floor dry. Clean up any water that spills on the floor as soon as it happens. Remove soap buildup in the tub or shower regularly. Attach bath mats securely with double-sided non-slip rug tape. Do not have throw rugs and other things on the floor that can make you trip. What can I do in the bedroom? Use night  lights. Make sure that you have a light by your bed that is easy to reach. Do not use any sheets or blankets that are too big for your bed. They should not hang down onto the floor. Have a firm chair that has side arms. You can use this for support while you get dressed. Do not have throw rugs and other things on the floor that can make you trip. What can I do in the kitchen? Clean up any spills right away. Avoid walking on wet floors. Keep items that you use a lot in easy-to-reach places. If you need to reach something above you, use a strong step stool that has a grab bar. Keep electrical cords out of the way. Do not use floor polish or wax that makes floors slippery. If you must use wax, use non-skid floor wax. Do not have throw rugs and other things on the floor that can make you trip. What can I do with my stairs? Do not leave any items on the stairs. Make sure that there are handrails on both sides of the stairs and use them. Fix handrails that are broken or loose. Make sure that handrails are as long as the stairways. Check any carpeting to make sure that it is firmly attached to the stairs. Fix any carpet that is loose or worn. Avoid having throw rugs at the top or bottom of the stairs. If you do have throw rugs, attach  them to the floor with carpet tape. Make sure that you have a light switch at the top of the stairs and the bottom of the stairs. If you do not have them, ask someone to add them for you. What else can I do to help prevent falls? Wear shoes that: Do not have high heels. Have rubber bottoms. Are comfortable and fit you well. Are closed at the toe. Do not wear sandals. If you use a stepladder: Make sure that it is fully opened. Do not climb a closed stepladder. Make sure that both sides of the stepladder are locked into place. Ask someone to hold it for you, if possible. Clearly mark and make sure that you can see: Any grab bars or handrails. First and last  steps. Where the edge of each step is. Use tools that help you move around (mobility aids) if they are needed. These include: Canes. Walkers. Scooters. Crutches. Turn on the lights when you go into a dark area. Replace any light bulbs as soon as they burn out. Set up your furniture so you have a clear path. Avoid moving your furniture around. If any of your floors are uneven, fix them. If there are any pets around you, be aware of where they are. Review your medicines with your doctor. Some medicines can make you feel dizzy. This can increase your chance of falling. Ask your doctor what other things that you can do to help prevent falls. This information is not intended to replace advice given to you by your health care provider. Make sure you discuss any questions you have with your health care provider. Document Released: 12/28/2008 Document Revised: 08/09/2015 Document Reviewed: 04/07/2014 Elsevier Interactive Patient Education  2017 [employer]..

## 2021-10-23 ENCOUNTER — Other Ambulatory Visit: Payer: Medicare Other

## 2021-10-31 ENCOUNTER — Ambulatory Visit (INDEPENDENT_AMBULATORY_CARE_PROVIDER_SITE_OTHER): Payer: Medicare Other | Admitting: Primary Care

## 2021-10-31 ENCOUNTER — Other Ambulatory Visit: Payer: Self-pay | Admitting: Primary Care

## 2021-10-31 ENCOUNTER — Encounter: Payer: Self-pay | Admitting: Primary Care

## 2021-10-31 VITALS — BP 134/78 | HR 78 | Temp 98.6°F | Ht 65.0 in | Wt 204.0 lb

## 2021-10-31 DIAGNOSIS — Z794 Long term (current) use of insulin: Secondary | ICD-10-CM

## 2021-10-31 DIAGNOSIS — J302 Other seasonal allergic rhinitis: Secondary | ICD-10-CM

## 2021-10-31 DIAGNOSIS — G5793 Unspecified mononeuropathy of bilateral lower limbs: Secondary | ICD-10-CM

## 2021-10-31 DIAGNOSIS — E039 Hypothyroidism, unspecified: Secondary | ICD-10-CM

## 2021-10-31 DIAGNOSIS — J4520 Mild intermittent asthma, uncomplicated: Secondary | ICD-10-CM

## 2021-10-31 DIAGNOSIS — E1165 Type 2 diabetes mellitus with hyperglycemia: Secondary | ICD-10-CM

## 2021-10-31 DIAGNOSIS — E785 Hyperlipidemia, unspecified: Secondary | ICD-10-CM

## 2021-10-31 DIAGNOSIS — J45909 Unspecified asthma, uncomplicated: Secondary | ICD-10-CM

## 2021-10-31 DIAGNOSIS — N6091 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: Secondary | ICD-10-CM

## 2021-10-31 MED ORDER — ALBUTEROL SULFATE HFA 108 (90 BASE) MCG/ACT IN AERS: INHALATION_SPRAY | RESPIRATORY_TRACT | 0 refills | Status: DC

## 2021-10-31 MED ORDER — GABAPENTIN 100 MG PO CAPS: ORAL_CAPSULE | ORAL | 0 refills | Status: DC

## 2021-10-31 NOTE — Assessment & Plan Note (Addendum)
Monofilament testing today unremarkable.  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    Will start Gabapentin 100 mg 1-3 capsules by mouth once to twice daily for pain.   Will continue to monitor.  We have asked her to update in 1 to 2 weeks.  I evaluated patient, was consulted regarding treatment, and agree with assessment and plan per Tinnie Gens, RN, DNP student.   Allie Bossier, NP-C

## 2021-10-31 NOTE — Progress Notes (Signed)
Established Patient Office Visit  Subjective   Patient ID: Molly Wall, female    DOB: Jul 01, 1945  Age: 76 y.o. MRN: [id number]  Chief Complaint  Patient presents with   Numbness    B/l feet increased over time.     HPI Molly Wall is a very pleasant 76 y.o. female with a history of type 2 diabetes, hyperlipidemia and chronic back pain presents to the office for worsening numbness and tingling in her lower extremities that has gotten worse since end of May. It is worse at night or when she is sitting for long period of time. She has not tried any medications. She get some relief with walking and worse when she has candy.   1) Type 2 Diabetes mellitus: Followed by endocrinologist. Her last visit was on 08/08/21. Her last A1c was 7.4. No changes were made to her medication. She is taking Metformin ER 1000 mg, Ozempic 1 mg weekly and Insulin via insulin pump. She takes Atorvastatin 20 mg for statin therapy. She has noticed increased numbness and tingling in lower extremities, bilaterally. She has not been exercising due to the heat. She manages her lows with a piece of candy and orange juice. She has had her yearly eye exam. She was told that she has cataracts and she will follow up with them regarding that.   2) Hypothyroidism: Currently taking Levothyroxine 88 mcg with no adverse effects. Last TSH was in 2022 which was within normal limits.   3) Asthma: Currently on Fluticasone-salmeterol 250-50 mcg 1-2 times a week, Montelukast 10 mg HS and Albuterol PRN.   4) Breast cancer: Followed by Oncology. Currently on Anastrozole daily. She had a mammogram in February, 2023. She is scheduled for breast MRI next week .   Patient Active Problem List   Diagnosis Date Noted   Neuropathic pain of both feet 10/31/2021   GERD (gastroesophageal reflux disease) 04/25/2021   Vegan diet 02/21/2020   Word finding difficulty 02/21/2020   Arthralgia of both hands 11/29/2019    Seasonal allergic rhinitis 11/29/2019   Chronic knee pain (intermittent) (Right) 11/01/2018   Chronic pain syndrome 10/31/2018   Flank pain 10/13/2018   Shortness of breath 10/13/2018   History iodine allergy 09/28/2018    Class: History of   DDD (degenerative disc disease), lumbar 09/27/2018   Grade 1 Anterolisthesis of L5/S1 09/16/2018   Lumbar facet arthropathy 09/16/2018   Arthropathy of lumbosacral facet joint 09/16/2018   Sialadenitis 08/24/2018   Genetic testing 11/27/2017   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 11/06/2017   Family history of breast cancer    Family history of colon cancer    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   Constipation 04/13/2017   Chronic low back pain (Secondary area of Pain) (Bilateral) (R>L) 02/13/2016   Chronic lower extremity pain (Primary Area of Pain) (Right) 02/13/2016   Chronic lumbar radicular pain (Right) 02/13/2016   Type 2 diabetes mellitus with hyperglycemia, with long-term current use of insulin ([place]) 07/18/2015   Preventative health care 07/18/2015   Vitamin D deficiency 07/18/2015   Hyperlipidemia 12/13/2014   Asthma, chronic 12/13/2014   Hypothyroidism 12/13/2014   Lumbar herniated disc 12/13/2014   Past Medical History:  Diagnosis Date   Allergic rhinitis    Allergy    Asthma    Atypical ductal hyperplasia of right breast    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   Breast cancer ([place])    Diabetes mellitus  without complication ([place])    Family history of breast cancer    Family history of colon cancer    Hypercholesteremia    Hypothyroidism    Lumbar herniated disc    OSA (obstructive sleep apnea)    used to use CPAP, not any more over 1 1/2 yrs   Salivary stone    left side   Sleep apnea    no cpap in 2 yrs    Vitamin D deficiency    Past Surgical History:  Procedure Laterality Date   APPENDECTOMY     BREAST LUMPECTOMY WITH RADIOACTIVE SEED LOCALIZATION Right  08/12/2017   Procedure: RIGHT BREAST LUMPECTOMY X'S 2 WITH RADIOACTIVE SEED LOCALIZATION X'S 2;  Surgeon: Fanny Skates, MD;  Location: [facility];  Service: General;  Laterality: Right;   COLONOSCOPY     11 yrs ago in [place] but given a 10 yr recall per pt    GANGLION CYST EXCISION     76 years old   [place]  2014   [place]   TONSILLECTOMY     age 87   Social History   Tobacco Use   Smoking status: Never   Smokeless tobacco: Never  Vaping Use   Vaping Use: Never used  Substance Use Topics   Alcohol use: Yes    Alcohol/week: 0.0 - 1.0 standard drinks of alcohol    Comment: 0-1 per month   Drug use: No   Allergies  Allergen Reactions   Iodine-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 She described it was a small amount in a milk carton in [place]. She states they stopped using it shortly after because the trial wasn't successful. SPM      Review of Systems  Constitutional:  Negative for chills and fever.  HENT:  Negative for hearing loss.   Eyes:  Negative for blurred vision.  Respiratory:  Positive for shortness of breath.        Due to allergies.  Cardiovascular:  Negative for chest pain.  Gastrointestinal:  Negative for abdominal pain, nausea and vomiting.  Musculoskeletal:  Positive for joint pain.       Generalized  Neurological:  Positive for tingling. Negative for sensory change, speech change and weakness.  Psychiatric/Behavioral:  The patient is nervous/anxious.       Objective:     BP 134/78   Pulse 78   Temp 98.6 F (37 C) (Oral)   Ht '5\' 5"'$  (1.651 m)   Wt 204 lb (92.5 kg)   SpO2 98%   BMI 33.95 kg/m  BP Readings from Last 3 Encounters:  10/31/21 134/78  10/15/21 (!) 142/70  08/08/21 120/76   Wt Readings from Last 3 Encounters:  10/31/21 204 lb (92.5 kg)  10/18/21 204 lb (92.5 kg)   10/15/21 203 lb (92.1 kg)      Physical Exam Vitals and nursing note reviewed.  Constitutional:      Appearance: Normal appearance.  Cardiovascular:     Rate and Rhythm: Normal rate and regular rhythm.     Pulses: Normal pulses.     Heart sounds: Normal heart sounds.  Pulmonary:     Effort: Pulmonary effort is normal.     Breath sounds: Normal breath sounds.  Musculoskeletal:        General: No swelling or tenderness.  Skin:  General: Skin is warm.  Neurological:     Mental Status: She is alert and oriented to person, place, and time.  Psychiatric:        Behavior: Behavior normal.      No results found for any visits on 10/31/21.  Last CBC Lab Results  Component Value Date   WBC 4.9 10/15/2021   HGB 14.1 10/15/2021   HCT 42.4 10/15/2021   MCV 85.8 10/15/2021   MCH 28.5 10/15/2021   RDW 12.8 10/15/2021   PLT 236 39/05/90   Last metabolic panel Lab Results  Component Value Date   GLUCOSE 98 10/15/2021   NA 138 10/15/2021   K 4.4 10/15/2021   CL 101 10/15/2021   CO2 31 10/15/2021   BUN 14 10/15/2021   CREATININE 0.78 10/15/2021   GFRNONAA >60 10/15/2021   CALCIUM 9.7 10/15/2021   PROT 7.3 10/15/2021   ALBUMIN 4.4 10/15/2021   BILITOT 0.5 10/15/2021   ALKPHOS 87 10/15/2021   AST 15 10/15/2021   ALT 11 10/15/2021   ANIONGAP 6 10/15/2021   Last hemoglobin A1c Lab Results  Component Value Date   HGBA1C 7.4 (A) 08/08/2021      The 10-year ASCVD risk score (Arnett DK, et al., 2019) is: 24.7%    Assessment & Plan:   Problem List Items Addressed This Visit       Respiratory   Asthma, chronic    Controlled.   Continue Flticason-salmeterol 250-50 mcg daily, Montelukast 10 mg HS and Albuterol PRN. Refill sent for Albuterol.       Relevant Medications   albuterol (VENTOLIN HFA) 108 (90 Base) MCG/ACT inhaler     Endocrine   Hypothyroidism    Controlled. Continue Levothyroxine 88 mcg daily.   TSH Pending.      Relevant Orders   TSH    Type 2 diabetes mellitus with hyperglycemia, with long-term current use of insulin (HCC)    Controlled. Last A1C 5/23- 7.4 at endocrinology.   Last office visit and labs reviewed.  Continue Metformin ER 1000 mg, Ozempic 1 mg weekly and Insulin via insulin pump.       Relevant Orders   Lipid Panel   Microalbumin/Creatinine Ratio, Urine     Other   Neuropathic pain of both feet - Primary    Foot exam done today.   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    Will start Gabapentin 100 mg 1-3 capsules by mouth once to twice daily for pain.   Will continue to monitor.       Relevant Medications   gabapentin (NEURONTIN) 100 MG capsule    No follow-ups on file.    Tinnie Gens, BSN-RN, DNP STUDENT

## 2021-10-31 NOTE — Assessment & Plan Note (Addendum)
Controlled.   Continue Flticason-salmeterol 250-50 mcg daily, Montelukast 10 mg HS and Albuterol PRN. Refill sent for Albuterol.   I evaluated patient, was consulted regarding treatment, and agree with assessment and plan per Tinnie Gens, RN, DNP student.   Allie Bossier, NP-C

## 2021-10-31 NOTE — Assessment & Plan Note (Signed)
Repeat lipid panel pending.  Continue atorvastatin 20 mg daily. 

## 2021-10-31 NOTE — Assessment & Plan Note (Signed)
Following with oncology, office notes reviewed from August 2023. Mammogram reviewed from February 2023. Agree with MRI breasts for which is pending.  Continue anastrozole 1 mg daily.

## 2021-10-31 NOTE — Assessment & Plan Note (Signed)
Controlled. Continue montelukast 10 mg daily.

## 2021-10-31 NOTE — Assessment & Plan Note (Addendum)
She is taking levothyroxine correctly.  Continue Levothyroxine 88 mcg daily.   TSH Pending.  I evaluated patient, was consulted regarding treatment, and agree with assessment and plan per Tinnie Gens, RN, DNP student.   Allie Bossier, NP-C

## 2021-10-31 NOTE — Progress Notes (Signed)
Subjective:    Patient ID: Molly Wall, female    DOB: Aug 21, 1945, 76 y.o.   MRN: [id number]  HPI   Molly Wall is a very pleasant 76 y.o. female with a history of hypothyroidism, type 2 diabetes, GERD, chronic asthma, breast cancer, chronic back/knee pain, hyperlipidemia, who presents today for follow up of chronic conditions and to discuss foot pain.   1) Type 2 Diabetes: Following with endocrinology, managed on insulin pump, metformin 1000 mg BID, Ozempic 1 mg weekly. Last office visit was in May 2023 with A1C of 7.4.    She's noticed increased bilateral dorsal and plantar foot pain with tingling and numbness since May 2023. Worse with higher glucose readings and sitting for prolonged periods of time, improved with walking.  She denies injury or trauma, prolonged walking, radiation of symptoms of her legs.   2) Asthma: Currently prescribed fluticasone-salmeterol 250-50 mcg BID, albuterol inhaler PRN, Singulair 10 mg HS.    She is not using either inhaler, typically uses fluticasone-salmeterol during seasonal changes. She uses her albuterol inhaler infrequently. Overall feels well managed.   3) Hypothyroidism: managed on levothyroxine 88 mcg. She takes every morning on an empty stomach with water only. No food or other medications for 30 minutes. No heartburn medication, iron pills, calcium, vitamin D, or magnesium pills within four hours of taking levothyroxine.     Review of Systems  Respiratory:  Negative for shortness of breath and wheezing.   Cardiovascular:  Negative for chest pain.  Musculoskeletal:  Positive for arthralgias.  Skin:  Negative for color change.  Neurological:  Positive for numbness.  Psychiatric/Behavioral:  The patient is not nervous/anxious.          Past Medical History:  Diagnosis Date   Allergic rhinitis    Allergy    Asthma    Atypical ductal hyperplasia of right breast    Atypical ductal hyperplasia of right breast  08/12/2017   Breast cancer ([place])    Diabetes mellitus without complication (HCC)    Family history of breast cancer    Family history of colon cancer    Hypercholesteremia    Hypothyroidism    Lumbar herniated disc    OSA (obstructive sleep apnea)    used to use CPAP, not any more over 1 1/2 yrs   Salivary stone    left side   Sleep apnea    no cpap in 2 yrs    Vitamin D deficiency     Social History   Socioeconomic History   Marital status: Divorced    Spouse name: Not on file   Number of children: 2   Years of education: Not on file   Highest education level: Not on file  Occupational History   Not on file  Tobacco Use   Smoking status: Never   Smokeless tobacco: Never  Vaping Use   Vaping Use: Never used  Substance and Sexual Activity   Alcohol use: Yes    Alcohol/week: 0.0 - 1.0 standard drinks of alcohol    Comment: 0-1 per month   Drug use: No   Sexual activity: Not Currently  Other Topics Concern   Not on file  Social History Narrative   Single.   Moved from [place] to [place] several weeks ago.   Family lives in [place].   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   Social Determinants of  Health   Financial Resource Strain: Low Risk  (10/18/2021)   Overall Financial Resource Strain (CARDIA)    Difficulty of Paying Living Expenses: Not hard at all  Food Insecurity: No Food Insecurity (10/18/2021)   Hunger Vital Sign    Worried About Running Out of Food in the Last Year: Never true    Ran Out of Food in the Last Year: Never true  Transportation Needs: No Transportation Needs (10/18/2021)   PRAPARE - Hydrologist (Medical): No    Lack of Transportation (Non-Medical): No  Physical Activity: Inactive (10/18/2021)   Exercise Vital Sign    Days of Exercise per Week: 0 days    Minutes of Exercise per Session: 0 min  Stress: No Stress Concern Present  (10/18/2021)   [place]    Feeling of Stress : Not at all  Social Connections: Not on file  Intimate Partner Violence: Not on file    Past Surgical History:  Procedure Laterality Date   APPENDECTOMY     BREAST LUMPECTOMY WITH RADIOACTIVE SEED LOCALIZATION Right 08/12/2017   Procedure: RIGHT BREAST LUMPECTOMY X'S 2 WITH RADIOACTIVE SEED LOCALIZATION X'S 2;  Surgeon: Fanny Skates, MD;  Location: [facility];  Service: General;  Laterality: Right;   COLONOSCOPY     11 yrs ago in [place] - polyps but given a 10 yr recall per pt    GANGLION CYST EXCISION     76 years old   [name]  2014   [place]     age 52    Family History  Problem Relation Age of Onset   Alzheimer's disease Mother    Asthma Mother    Dementia Mother        cause of death   Arthritis Father    Heart attack Father 53       cause of death   Breast cancer Sister 45   Healthy Daughter    Colon cancer Paternal Grandmother    Liver cancer Maternal Uncle    Healthy Daughter    Healthy Brother    Healthy Sister    Colon polyps Neg Hx    Rectal cancer Neg Hx    Stomach cancer Neg Hx     Allergies  Allergen Reactions   Iodine-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 She described it was a small amount in a milk carton in [place]. She states they stopped using it shortly after because the trial wasn't successful. SPM    Current Outpatient Medications on File Prior to Visit  Medication Sig Dispense Refill   anastrozole (ARIMIDEX) 1 MG tablet Take 1 tablet (1 mg total) by mouth daily. 90 tablet 3   atorvastatin (LIPITOR) 20 MG tablet TAKE 1 TABLET BY MOUTH EVERY DAY FOR CHOLESTEROL. Office visit required for further refills. 90 tablet 0   Continuous Blood Gluc Receiver (DEXCOM G6  RECEIVER) DEVI Use to monitor blood sugar 1 each 0   Continuous Blood Gluc Sensor (DEXCOM G6 SENSOR) MISC Use as instructed change sensor every 10 days E11.65 9 each 3   Continuous Blood Gluc Transmit (DEXCOM G6 TRANSMITTER) MISC 1 Device by Does not apply route every 3 (three) months. 1 each 3   fluticasone (FLONASE) 50 MCG/ACT nasal spray INSTILL 1 SPRAY IN [facility]  NOSTRIL TWICE A DAY AS NEEDED FOR ALLERGIES OR RHINITIS 48 mL 0   fluticasone-salmeterol (WIXELA INHUB) 250-50 MCG/ACT AEPB TAKE 1 PUFF BY MOUTH TWICE A DAY 60 each 11   insulin aspart (NOVOLOG) 100 UNIT/ML injection INJECT up to 70 UNITS INTO THE SKIN ONCE FOR 1 DOSE. VIA PUMP 90 mL 3   Insulin Disposable Pump (V-GO 20) 20 UNIT/24HR KIT USE 1X A DAY 90 kit 3   levothyroxine (SYNTHROID) 88 MCG tablet TAKE 1 AND 1/2 TABLET BY MOUTH EVERY SUNDAY. TAKE 1 TABLET BY MOUTH MON THROUGH SAT. TAKE ON AN EMPTY STOMACH WITH WATER ONLY. NO FOOD OR OTHER MEDICATIONS FOR 30 MINUTES. 90 tablet 3   metFORMIN (GLUCOPHAGE-XR) 500 MG 24 hr tablet TAKE 2 TABLETS (1,000 MG TOTAL) BY MOUTH DAILY WITH BREAKFAST. FOR DIABETES. 180 tablet 1   montelukast (SINGULAIR) 10 MG tablet TAKE 1 TABLET (10 MG TOTAL) BY MOUTH AT BEDTIME. FOR ALLERGIES AND ASTHMA. 90 tablet 2   ONETOUCH VERIO test strip USE AS DIRECTED TO CHECK FASTING BLOOD SUGARS THREE TIMES DAILY. 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    BP 134/78   Pulse 78   Temp 98.6 F (37 C) (Oral)   Ht '5\' 5"'  (1.651 m)   Wt 204 lb (92.5 kg)   SpO2 98%   BMI 33.95 kg/m  Objective:   Physical Exam Cardiovascular:     Rate and Rhythm: Normal rate and regular rhythm.  Pulmonary:     Effort: Pulmonary effort is normal.     Breath sounds: Normal breath sounds.  Abdominal:     General: Bowel sounds are normal.     Palpations: Abdomen is soft.      Tenderness: There is no abdominal tenderness.  Musculoskeletal:     Cervical back: Neck supple.  Skin:    General: Skin is warm and dry.  Neurological:     Mental Status: She is alert and oriented to person, place, and time.  Psychiatric:        Mood and Affect: Mood normal.           Assessment & Plan:   Problem List Items Addressed This Visit       Respiratory   Asthma, chronic    Controlled.   Continue Flticason-salmeterol 250-50 mcg daily, Montelukast 10 mg HS and Albuterol PRN. Refill sent for Albuterol.   I evaluated patient, was consulted regarding treatment, and agree with assessment and plan per Tinnie Gens, RN, DNP student.   Allie Bossier, NP-C       Relevant Medications   albuterol (VENTOLIN HFA) 108 (90 Base) MCG/ACT inhaler   Seasonal allergic rhinitis    Controlled. Continue montelukast 10 mg daily.        Endocrine   Hypothyroidism    She is taking levothyroxine correctly.  Continue Levothyroxine 88 mcg daily.   TSH Pending.  I evaluated patient, was consulted regarding treatment, and agree with assessment and plan per Tinnie Gens, RN, DNP student.   Allie Bossier, NP-C       Relevant Orders   TSH   Type 2 diabetes mellitus with hyperglycemia, with long-term current use of insulin ([place])    Controlled.  Follows with endocrinology.  Last office visit and labs reviewed.  Continue Metformin ER 1000 mg, Ozempic 1 mg weekly  and Insulin via insulin pump.   I evaluated patient, was consulted regarding treatment, and agree with assessment and plan per Tinnie Gens, RN, DNP student.   Allie Bossier, NP-C       Relevant Orders   Lipid Panel   Microalbumin/Creatinine Ratio, Urine     Other   Hyperlipidemia    Repeat lipid panel pending. Continue atorvastatin 20 mg daily.      Atypical ductal hyperplasia of right breast    Following with oncology, office notes reviewed from August 2023. Mammogram reviewed from February 2023. Agree with MRI  breasts for which is pending.  Continue anastrozole 1 mg daily.      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    Will start Gabapentin 100 mg 1-3 capsules by mouth once to twice daily for pain.   Will continue to monitor.  We have asked her to update in 1 to 2 weeks.  I evaluated patient, was consulted regarding treatment, and agree with assessment and plan per Tinnie Gens, RN, DNP student.   Allie Bossier, NP-C       Relevant Medications   gabapentin (NEURONTIN) 100 MG capsule       [name], NP

## 2021-10-31 NOTE — Patient Instructions (Signed)
Stop by the lab prior to leaving today. I will notify you of your results once received.   Start Gabapentin 100 mg Take 1-3 capsules by mouth once to twice daily for pain. Update me in couple of weeks on my chart.   Refill sent for Albuterol.  It was a pleasure to see you today!

## 2021-10-31 NOTE — Assessment & Plan Note (Addendum)
Controlled.  Follows with endocrinology.  Last office visit and labs reviewed.  Continue Metformin ER 1000 mg, Ozempic 1 mg weekly and Insulin via insulin pump.   I evaluated patient, was consulted regarding treatment, and agree with assessment and plan per Tinnie Gens, RN, DNP student.   Allie Bossier, NP-C

## 2021-11-01 LAB — LIPID PANEL
Cholesterol: 187 mg/dL (ref 0–200)
HDL: 79.1 mg/dL (ref >39.00)
LDL Cholesterol: 92 mg/dL (ref 0–99)
NonHDL: 108.24
Total CHOL/HDL Ratio: 2
Triglycerides: 79 mg/dL (ref 0.0–149.0)
VLDL: 15.8 mg/dL (ref 0.0–40.0)

## 2021-11-01 LAB — MICROALBUMIN / CREATININE URINE RATIO
Creatinine,U: 113.7 mg/dL
Microalb Creat Ratio: 0.8 mg/g (ref 0.0–30.0)
Microalb, Ur: 0.9 mg/dL (ref 0.0–1.9)

## 2021-11-01 LAB — TSH: TSH: 2.86 u[IU]/mL (ref 0.35–5.50)

## 2021-11-06 ENCOUNTER — Ambulatory Visit
Admission: RE | Admit: 2021-11-06 | Discharge: 2021-11-06 | Disposition: A | Discharge status: Home / Self Care | Payer: Medicare Other | Source: Ambulatory Visit | Attending: Adult Health | Admitting: Adult Health

## 2021-11-06 DIAGNOSIS — Z1239 Encounter for other screening for malignant neoplasm of breast: Secondary | ICD-10-CM

## 2021-11-06 DIAGNOSIS — N6091 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: Secondary | ICD-10-CM

## 2021-11-06 MED ORDER — GADOBUTROL 1 MMOL/ML IV SOLN
9.0000 mL | Freq: Once | INTRAVENOUS | Status: AC | PRN
  Administered 2021-11-06: 9 mL via INTRAVENOUS

## 2021-11-11 ENCOUNTER — Telehealth: Payer: Self-pay | Admitting: *Deleted

## 2021-11-11 NOTE — Telephone Encounter (Signed)
-----   Message from Gardenia Phlegm, NP sent at 11/10/2021 10:31 PM EDT ----- Regarding: FW: Mri negative for malignancy, please notify patient of results ----- Message ----- From: Interface, Rad Results In Sent: 11/07/2021   9:57 AM EDT To: Gardenia Phlegm, NP

## 2021-11-20 ENCOUNTER — Encounter: Payer: Self-pay | Admitting: Nurse Practitioner

## 2021-11-20 ENCOUNTER — Ambulatory Visit (INDEPENDENT_AMBULATORY_CARE_PROVIDER_SITE_OTHER): Payer: Medicare Other | Admitting: Nurse Practitioner

## 2021-11-20 ENCOUNTER — Ambulatory Visit (INDEPENDENT_AMBULATORY_CARE_PROVIDER_SITE_OTHER)
Admission: RE | Admit: 2021-11-20 | Discharge: 2021-11-20 | Disposition: A | Discharge status: Home / Self Care | Payer: Medicare Other | Source: Ambulatory Visit | Attending: Nurse Practitioner | Admitting: Nurse Practitioner

## 2021-11-20 VITALS — BP 138/76 | HR 75 | Temp 97.8°F | Resp 16 | Ht 65.0 in | Wt 204.2 lb

## 2021-11-20 DIAGNOSIS — R0689 Other abnormalities of breathing: Secondary | ICD-10-CM

## 2021-11-20 DIAGNOSIS — J0110 Acute frontal sinusitis, unspecified: Secondary | ICD-10-CM | POA: Diagnosis not present

## 2021-11-20 HISTORY — DX: Other abnormalities of breathing: R06.89

## 2021-11-20 MED ORDER — AMOXICILLIN-POT CLAVULANATE 875-125 MG PO TABS: 1.0000 | ORAL_TABLET | Freq: Two times a day (BID) | ORAL | 0 refills | Status: AC

## 2021-11-20 NOTE — Assessment & Plan Note (Signed)
Did hear crackles in bilateral lower lobes.  Given the symptoms going on for 3 weeks without improvement and patient's age and comorbidities will like to get chest x-ray to rule out pneumonia.  We will go ahead and start patient on Augmentin 875-125 mg twice daily for 7 days.  Pending chest x-ray in office

## 2021-11-20 NOTE — Patient Instructions (Signed)
Nice to see you today Get an over the counter second generation antihistamine like: Claritin, allegra, xyzal, or zyrtec. Take it in the evening for approx 2 weeks Follow up if no improvement or symptoms worsen

## 2021-11-20 NOTE — Assessment & Plan Note (Addendum)
We will elect to treat with Augmentin 875-125 mg twice daily for 7 days.  Follow-up if no improvement.  Also encourage patient to get second-generation antihistamine to help with some of the secretions using Flonase and over-the-counter medications as beneficial.

## 2021-11-20 NOTE — Progress Notes (Signed)
Acute Office Visit  Subjective:     Patient ID: Molly Wall, female    DOB: Jul 31, 1945, 76 y.o.   MRN: [id number]  Chief Complaint  Patient presents with   Sinus Problem    X 3 weeks, head/sinus congestion and pressure, constant post nasal drip and makes it hard to sleep, some dizziness, fatigue, sx are up and down through out the day. No fever. Has had some chills though. Covid test negative last time on 11/18/21. Has used nasal spray, singulair, used rescue inhaler a couple of time, Emergen C, cough drops.     Patient is in today for Sinus  Symptoms started approx 3 weeks No sick contacts. States that she has a young man that rents a room that had the similar symptoms.  But since he is fully recovered. Covid test that was negative. She tested 3 days ago No covid vaccine Has been using some OTC that has given some temporry relief.  See above for list of medications patient's been using over-the-counter.  Review of Systems  Constitutional:  Positive for chills and malaise/fatigue. Negative for fever.  HENT:  Positive for ear pain (fullness) and sinus pain. Negative for ear discharge and sore throat (raw feeling).        PND "+"  Respiratory:  Positive for shortness of breath.   Cardiovascular:  Negative for chest pain.  Musculoskeletal:  Negative for joint pain and myalgias.  Neurological:  Positive for dizziness and headaches (improved).        Objective:    BP 138/76   Pulse 75   Temp 97.8 F (36.6 C) (Temporal)   Resp 16   Ht '5\' 5"'$  (1.651 m)   Wt 204 lb 4 oz (92.6 kg)   SpO2 96%   BMI 33.99 kg/m    Physical Exam Vitals and nursing note reviewed.  Constitutional:      Appearance: Normal appearance.  HENT:     Right Ear: Tympanic membrane, ear canal and external ear normal.     Left Ear: Tympanic membrane, ear canal and external ear normal.     Nose:     Right Sinus: Frontal sinus tenderness present. No maxillary sinus tenderness.     Left  Sinus: No maxillary sinus tenderness or frontal sinus tenderness.     Mouth/Throat:     Mouth: Mucous membranes are moist.     Pharynx: Oropharynx is clear.  Cardiovascular:     Rate and Rhythm: Normal rate and regular rhythm.     Heart sounds: Normal heart sounds.  Pulmonary:     Effort: Pulmonary effort is normal.     Breath sounds: Rales present.     Comments: Bilateral lower lobes Neurological:     Mental Status: She is alert.     No results found for any visits on 11/20/21.      Assessment & Plan:   Problem List Items Addressed This Visit       Respiratory   Acute non-recurrent frontal sinusitis    We will elect to treat with Augmentin 875-125 mg twice daily for 7 days.  Follow-up if no improvement.  Also encourage patient to get second-generation antihistamine to help with some of the secretions using Flonase and over-the-counter medications as beneficial.      Relevant Medications   amoxicillin-clavulanate (AUGMENTIN) 875-125 MG tablet     Other   Adventitious breath sounds - Primary    Did hear crackles in bilateral lower lobes.  Given the  symptoms going on for 3 weeks without improvement and patient's age and comorbidities will like to get chest x-ray to rule out pneumonia.  We will go ahead and start patient on Augmentin 875-125 mg twice daily for 7 days.  Pending chest x-ray in office      Relevant Orders   DG Chest 2 View    Meds ordered this encounter  Medications   amoxicillin-clavulanate (AUGMENTIN) 875-125 MG tablet    Sig: Take 1 tablet by mouth 2 (two) times daily for 7 days.    Dispense:  14 tablet    Refill:  0    Order Specific Question:   Supervising Provider    Answer:   TOWER, MARNE A [[id number]    Return if symptoms worsen or fail to improve.  Romilda Garret, NP

## 2021-11-22 ENCOUNTER — Other Ambulatory Visit: Payer: Self-pay | Admitting: Primary Care

## 2021-11-22 DIAGNOSIS — E785 Hyperlipidemia, unspecified: Secondary | ICD-10-CM

## 2021-11-26 ENCOUNTER — Other Ambulatory Visit: Payer: Self-pay

## 2021-11-26 DIAGNOSIS — Z794 Long term (current) use of insulin: Secondary | ICD-10-CM

## 2021-11-26 MED ORDER — INSULIN ASPART 100 UNIT/ML IJ SOLN: INTRAMUSCULAR | 3 refills | Status: DC
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
## 2021-12-06 ENCOUNTER — Ambulatory Visit (INDEPENDENT_AMBULATORY_CARE_PROVIDER_SITE_OTHER): Payer: Medicare Other | Admitting: Primary Care

## 2021-12-06 ENCOUNTER — Ambulatory Visit (INDEPENDENT_AMBULATORY_CARE_PROVIDER_SITE_OTHER)
Admission: RE | Admit: 2021-12-06 | Discharge: 2021-12-06 | Disposition: A | Discharge status: Home / Self Care | Payer: Medicare Other | Source: Ambulatory Visit | Attending: Primary Care | Admitting: Primary Care

## 2021-12-06 ENCOUNTER — Encounter: Payer: Self-pay | Admitting: Primary Care

## 2021-12-06 VITALS — BP 126/66 | HR 83 | Temp 97.9°F | Ht 65.0 in | Wt 203.0 lb

## 2021-12-06 DIAGNOSIS — R0689 Other abnormalities of breathing: Secondary | ICD-10-CM | POA: Diagnosis not present

## 2021-12-06 DIAGNOSIS — J0110 Acute frontal sinusitis, unspecified: Secondary | ICD-10-CM | POA: Diagnosis not present

## 2021-12-06 MED ORDER — PREDNISONE 20 MG PO TABS: ORAL_TABLET | ORAL | 0 refills | Status: DC

## 2021-12-06 NOTE — Assessment & Plan Note (Signed)
Wheezing throughout, question asthma exacerbation. Reviewed chest xray from 11/20/21. Repeat chest xray pending today.  Start prednisone 40 mg daily x 5 days.

## 2021-12-06 NOTE — Patient Instructions (Signed)
Start prednisone 20 mg tablets. Take 2 tablets by mouth once daily in the morning for 5 days.  Complete xray(s) prior to leaving today. I will notify you of your results once received.  It was a pleasure to see you today!

## 2021-12-06 NOTE — Progress Notes (Signed)
Subjective:    Patient ID: Molly Wall, female    DOB: [birth year], 76 y.o.   MRN: [id number]  HPI  Molly Wall is a very pleasant 76 y.o. female with a history of asthma, seasonal allergic rhinitis, GERD who presents today to discuss nasal congestion.   Evaluated on 11/20/21 by Romilda Garret, NP for a three week history of sinus congestion, sinus pressure, post nasal drip, dizziness, fatigue. Covid test was negative. No improvement with OTC and Rx meds. During this visit she was prescribed Augmentin BID x 7 days and instructed to start antihistamine and Flonase.  Today she endorses a slight improvement in her symptoms after she took her Augmentin, but she feels her symptoms "coming back". Over the last 3-4 days she's noticed increased post nasal drip, ear pressure, mid frontal sinus pressure, chest tightness, mild cough. She retested for Covid-19 which was negative.  She's been taking a daily antihistamine and Flonase nightly. Overall she's better than when she first started with symptoms.    Review of Systems  Constitutional:  Positive for fatigue.  HENT:  Positive for congestion, postnasal drip and sinus pressure.   Respiratory:  Positive for cough, chest tightness and wheezing. Negative for shortness of breath.          Past Medical History:  Diagnosis Date   Allergic rhinitis    Allergy    Asthma    Atypical ductal hyperplasia of right breast    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   Breast cancer ([place])    Diabetes mellitus without complication (HCC)    Family history of breast cancer    Family history of colon cancer    Hypercholesteremia    Hypothyroidism    Lumbar herniated disc    OSA (obstructive sleep apnea)    used to use CPAP, not any more over 1 1/2 yrs   Salivary stone    left side   Sleep apnea    no cpap in 2 yrs    Vitamin D deficiency     Social History   Socioeconomic History   Marital status: Divorced     Spouse name: Not on file   Number of children: 2   Years of education: Not on file   Highest education level: Not on file  Occupational History   Not on file  Tobacco Use   Smoking status: Never   Smokeless tobacco: Never  Vaping Use   Vaping Use: Never used  Substance and Sexual Activity   Alcohol use: Yes    Alcohol/week: 0.0 - 1.0 standard drinks of alcohol    Comment: 0-1 per month   Drug use: No   Sexual activity: Not Currently  Other Topics Concern   Not on file  Social History Narrative   Single.   Moved from [place] to [place] several weeks ago.   Family lives in [place].   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   Social Determinants of Health   Financial Resource Strain: Low Risk  (10/18/2021)   Overall Financial Resource Strain (CARDIA)    Difficulty of Paying Living Expenses: Not hard at all  Food Insecurity: No Food Insecurity (10/18/2021)   Hunger Vital Sign    Worried About Running Out of Food in the Last Year: Never true    Ran Out of Food in the Last Year: Never true  Transportation Needs: No  Transportation Needs (10/18/2021)   PRAPARE - Hydrologist (Medical): No    Lack of Transportation (Non-Medical): No  Physical Activity: Inactive (10/18/2021)   Exercise Vital Sign    Days of Exercise per Week: 0 days    Minutes of Exercise per Session: 0 min  Stress: No Stress Concern Present (10/18/2021)   [place]    Feeling of Stress : Not at all  Social Connections: Not on file  Intimate Partner Violence: Not on file    Past Surgical History:  Procedure Laterality Date   APPENDECTOMY     BREAST LUMPECTOMY WITH RADIOACTIVE SEED LOCALIZATION Right 08/12/2017   Procedure: RIGHT BREAST LUMPECTOMY X'S 2 WITH RADIOACTIVE SEED LOCALIZATION X'S 2;  Surgeon: Fanny Skates, MD;  Location:  [facility];  Service: General;  Laterality: Right;   COLONOSCOPY     11 yrs ago in [place] - polyps but given a 10 yr recall per pt    GANGLION CYST EXCISION     76 years old   [place]  2014   [place]     age 50    Family History  Problem Relation Age of Onset   Alzheimer's disease Mother    Asthma Mother    Dementia Mother        cause of death   Arthritis Father    Heart attack Father 50       cause of death   Breast cancer Sister 94   Healthy Daughter    Colon cancer Paternal Grandmother    Liver cancer Maternal Uncle    Healthy Daughter    Healthy Brother    Healthy Sister    Colon polyps Neg Hx    Rectal cancer Neg Hx    Stomach cancer Neg Hx     Allergies  Allergen Reactions   Iodine-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 She described it was a small amount in a milk carton in [place]. She states they stopped using it shortly after because the trial wasn't successful. SPM    Current Outpatient Medications on File Prior to Visit  Medication Sig Dispense Refill   albuterol (VENTOLIN HFA) 108 (90 Base) MCG/ACT inhaler INHALE 2 PUFFS BY MOUTH EVERY 6 HOURS AS NEEDED FOR WHEEZE OR SHORTNESS OF BREATH 8 g 0   anastrozole (ARIMIDEX) 1 MG tablet Take 1 tablet (1 mg total) by mouth daily. 90 tablet 3   atorvastatin (LIPITOR) 20 MG tablet TAKE 1 TABLET BY MOUTH EVERY DAY FOR CHOLESTEROL. 90 tablet 3   Continuous Blood Gluc Receiver (DEXCOM G6 RECEIVER) DEVI Use to monitor blood sugar 1 each 0   Continuous Blood Gluc Sensor (DEXCOM G6 SENSOR) MISC Use as instructed change sensor every 10 days E11.65 9 each 3   Continuous Blood Gluc Transmit (DEXCOM G6 TRANSMITTER) MISC 1 Device by Does not apply route every 3 (three) months. 1 each 3   fluticasone (FLONASE) 50 MCG/ACT nasal spray INSTILL 1 SPRAY IN EACH NOSTRIL  TWICE A DAY AS NEEDED FOR ALLERGIES OR RHINITIS 48 mL 0   fluticasone-salmeterol (WIXELA INHUB) 250-50 MCG/ACT AEPB TAKE 1 PUFF BY MOUTH TWICE A DAY 60 each 11   gabapentin (NEURONTIN) 100 MG capsule Take 1-3 capsules by mouth once to twice daily for pain.  90 capsule 0   insulin aspart (NOVOLOG) 100 UNIT/ML injection INJECT up to 70 UNITS INTO THE SKIN ONCE FOR 1 DOSE. VIA PUMP 90 mL 3   Insulin Disposable Pump (V-GO 20) 20 UNIT/24HR KIT USE 1X A DAY 90 kit 3   levothyroxine (SYNTHROID) 88 MCG tablet TAKE 1 AND 1/2 TABLET BY MOUTH EVERY SUNDAY. TAKE 1 TABLET BY MOUTH MON THROUGH SAT. TAKE ON AN EMPTY STOMACH WITH WATER ONLY. NO FOOD OR OTHER MEDICATIONS FOR 30 MINUTES. 96 tablet 3   metFORMIN (GLUCOPHAGE-XR) 500 MG 24 hr tablet TAKE 2 TABLETS (1,000 MG TOTAL) BY MOUTH DAILY WITH BREAKFAST. FOR DIABETES. 180 tablet 1   montelukast (SINGULAIR) 10 MG tablet TAKE 1 TABLET (10 MG TOTAL) BY MOUTH AT BEDTIME. FOR ALLERGIES AND ASTHMA. 90 tablet 2   ONETOUCH VERIO test strip USE AS DIRECTED TO CHECK FASTING BLOOD SUGARS THREE TIMES DAILY. 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    BP 126/66   Pulse 83   Temp 97.9 F (36.6 C) (Temporal)   Ht '5\' 5"'  (1.651 m)   Wt 203 lb (92.1 kg)   SpO2 98%   BMI 33.78 kg/m  Objective:   Physical Exam HENT:     Right Ear: Tympanic membrane and ear canal normal.     Left Ear: Tympanic membrane and ear canal normal.     Nose:     Right Sinus: No maxillary sinus tenderness or frontal sinus tenderness.     Left Sinus: No maxillary sinus tenderness or frontal sinus tenderness.     Mouth/Throat:     Pharynx: No posterior oropharyngeal erythema.  Eyes:     Conjunctiva/sclera: Conjunctivae normal.  Cardiovascular:     Rate and Rhythm: Normal rate and regular rhythm.  Pulmonary:     Effort: Pulmonary effort is  normal. No accessory muscle usage or respiratory distress.     Breath sounds: No decreased air movement. Examination of the right-upper field reveals wheezing. Examination of the left-upper field reveals wheezing. Examination of the right-lower field reveals wheezing. Examination of the left-lower field reveals wheezing. Wheezing present. No rhonchi or rales.  Musculoskeletal:     Cervical back: Neck supple.  Lymphadenopathy:     Cervical: No cervical adenopathy.  Skin:    General: Skin is warm and dry.           Assessment & Plan:   Problem List Items Addressed This Visit       Respiratory   Acute non-recurrent frontal sinusitis    Suspect viral etiology at this point. Start prednisone 40 mg daily x 5 days.  Await chest xray results.      Relevant Medications   predniSONE (DELTASONE) 20 MG tablet     Other   Adventitious breath sounds - Primary    Wheezing throughout, question asthma exacerbation. Reviewed chest xray from 11/20/21. Repeat chest xray pending today.  Start prednisone 40 mg daily x 5 days.      Relevant Orders   DG Chest 2 View       [name], NP

## 2021-12-06 NOTE — Assessment & Plan Note (Signed)
Suspect viral etiology at this point. Start prednisone 40 mg daily x 5 days.  Await chest xray results.

## 2021-12-09 ENCOUNTER — Telehealth: Payer: Self-pay

## 2021-12-09 ENCOUNTER — Telehealth: Payer: Self-pay | Admitting: Family Medicine

## 2021-12-09 ENCOUNTER — Encounter: Payer: Self-pay | Admitting: Internal Medicine

## 2021-12-09 ENCOUNTER — Ambulatory Visit (INDEPENDENT_AMBULATORY_CARE_PROVIDER_SITE_OTHER): Payer: Medicare Other | Admitting: Internal Medicine

## 2021-12-09 VITALS — BP 122/70 | HR 78 | Ht 65.0 in | Wt 203.4 lb

## 2021-12-09 DIAGNOSIS — E1165 Type 2 diabetes mellitus with hyperglycemia: Secondary | ICD-10-CM

## 2021-12-09 DIAGNOSIS — E669 Obesity, unspecified: Secondary | ICD-10-CM | POA: Diagnosis not present

## 2021-12-09 DIAGNOSIS — Z794 Long term (current) use of insulin: Secondary | ICD-10-CM | POA: Diagnosis not present

## 2021-12-09 DIAGNOSIS — E785 Hyperlipidemia, unspecified: Secondary | ICD-10-CM

## 2021-12-09 LAB — POCT GLYCOSYLATED HEMOGLOBIN (HGB A1C): Hemoglobin A1C: 8.4 % — AB (ref 4.0–5.6)

## 2021-12-09 MED ORDER — INSULIN PEN NEEDLE 32G X 4 MM MISC: 3 refills | Status: DC

## 2021-12-09 NOTE — Telephone Encounter (Signed)
I spoke with pt and she said today her FBS was 135; pt is feeling OK and already has an appt to see Dr Cruzita Lederer this afternoon at 1:20. Pt will ask Dr Cruzita Lederer if should take prednisone or not and if needed pt will call [facility] back. Sending note to Gentry Fitz NP who is out of office and Dr Einar Pheasant who is in office and [employer].

## 2021-12-09 NOTE — Patient Instructions (Addendum)
Please continue: - Metformin ER 1000 mg with breakfast - Ozempic 1 mg weekly - PFR33 with 6 clicks before meal  Do not correct high blood sugars at bedtime. If you have a late dinner, please use 5-6 units. If sugars before meals <60, do not take Novolog before that meal. If sugars before meals <90, try to move the bolus right before the meal  Please use NovoLog 6-12 units along with the VGo boluses before meals. You can also use this for correction.  Please return in 3 months.

## 2021-12-09 NOTE — Telephone Encounter (Signed)
Noted. Okay to discontinue prednisone.

## 2021-12-09 NOTE — Telephone Encounter (Signed)
Called patient she had just left app with endo. They had advised her to d/c as well. But was told by her that it was good she was on for a little while. She wanted to make sure Molly Wall was aware of what Endo told her in appointment today.

## 2021-12-09 NOTE — Telephone Encounter (Signed)
Noted and agree. 

## 2021-12-09 NOTE — Telephone Encounter (Signed)
[place] on call  Received call on Sunday. Sugars as high as 400 on prednisone despite V-Go use on Saturday. Mostly 300s but by time of call in 200s (221 I believe)- patient wanted to hold prednisone- I said that was very reasonable- call PCP back on Monday and can get in contact with endocrine- seemed to have stable to improved respiratory status

## 2021-12-09 NOTE — Telephone Encounter (Signed)
[place] Night - Client TELEPHONE ADVICE RECORD [facility] Patient Name: LARINE MCD ILL Gender: Female DOB: [birth year] Age: 76 Y 10 M 15 D Return Phone Number: [phone] (Primary) Address: City/ State/ Zip: [place] [address] Client [facility] Night - Client Client Site [place] - Night Provider Alma Friendly - NP Contact Type Call Who Is Calling Patient / Member / Family / Caregiver Call Type Triage / Clinical Relationship To Patient Self Return Phone Number [phone] (Primary) Chief Complaint Medication reaction Reason for Call Symptomatic / Request for Sw[place] states she had a sinus infection and she saw a NP that put her on antibiotics that cleared up most of her symptoms but the symptoms began recurring so she went back in and was told that she was having crackling in her lungs so she was prescribed prednisone. She took her first 2 doses yesterday morning and last night she woke up and her blood sugar was at 465. Currently it is at 320 and she would like to speak to a nurse to see if she should keep taking the prednisone because that is what is causing her blood sugar to spike. Translation No Nurse Assessment Nurse: Delphina Cahill, RN, Santiago Glad Date/Time Eilene Ghazi Time): 12/08/2021 9:45:23 AM Confirm and document reason for call. If symptomatic, describe symptoms. ---Caller states she had a sinus infection and she saw a NP that put her on antibiotics that cleared up most of her symptoms but the symptoms began recurring so she went back in and was told that she was having crackling in her lungs so she was prescribed prednisone. She took her first 2 doses yesterday morning and last night she woke up and her blood sugar was at 465. Currently it is at 320 and she would like to speak to a nurse to see if she should keep taking the prednisone because that is  what is causing her blood sugar to spike. States still has sinus infection sx plus crackling in her lungs. Np thought going into asthmatic episode. Diabetes 4+ years now. Wears an insulin pump and a CGM. About MN was 465, gave two boluses. Gave one every hour. 379, 454, 373, has been up with it. At 02am was down below 300, went to sleep. Then back up. Right now 221. Has four days left of Prednisone. Does the patient have any new or worsening symptoms? ---Yes Will a triage be completed? ---Yes PLEASE NOTE: All timestamps contained within this report are represented as [place] Standard Time. CONFIDENTIALTY NOTICE: This fax transmission is intended only for the addressee. It contains information that is legally privileged, confidential or otherwise protected from use or disclosure. If you are not the intended recipient, you are strictly prohibited from reviewing, disclosing, copying using or disseminating any of this information or taking any action in reliance on or regarding this information. If you have received this fax in error, please notify us immediately by telephone so that we can arrange for its return to [place]. Phone: [phone], Toll-Free: [phone], Fax: [phone] Page: 2 of 3 Call Id: [id number] Nurse Assessment Related visit to physician within the last 2 weeks? ---Yes Does the PT have any chronic conditions? (i.e. diabetes, asthma, this includes High risk factors for pregnancy, etc.) ---Yes List chronic conditions. ---Diabetes, Asthma Is this a behavioral health or substance abuse call? ---No Guidelines Guideline Title Affirmed Question Affirmed Notes Nurse Date/Time (Eastern Time) Diabetes - High Blood Sugar [1] Caller has  URGENT medication or insulin pump question AND [2] triager unable to answer question Wandra Arthurs 12/08/2021 9:54:38 AM Disp. Time Eilene Ghazi Time) Disposition Final User 12/08/2021 9:07:05 AM Attempt made - message left Wandra Arthurs 12/08/2021 9:19:42 AM Send To RN Personal Delphina Cahill, RN, Santiago Glad 12/08/2021 9:30:28 AM Attempt made - message left Delphina Cahill, RN, Santiago Glad 12/08/2021 10:02:50 AM Call PCP Now Yes Delphina Cahill, RN, Santiago Glad 12/08/2021 10:10:28 AM Called On-Call Provider Delphina Cahill, RN, Santiago Glad Final Disposition 12/08/2021 10:02:50 AM Call PCP Now Yes Delphina Cahill, RN, York Pellant Disagree/Comply Comply Caller Understands Yes PreDisposition Did not know what to do Care Advice Given Per Guideline CALL PCP NOW: * You need to discuss this with your doctor (or NP/PA). * I'll page the on-call provider now. If you haven't heard from the provider (or me) within 30 minutes, call again. CALL BACK IF: * You have more questions. * You become worse CARE ADVICE given per Diabetes - High Blood Sugar (Adult) guideline. Comments User: Theophilus Kinds, RN Date/Time Eilene Ghazi Time): 12/08/2021 10:15:05 AM Caller wears the Vgo pump/patch, 20U/24 hours. PLEASE NOTE: All timestamps contained within this report are represented as [place] Standard Time. CONFIDENTIALTY NOTICE: This fax transmission is intended only for the addressee. It contains information that is legally privileged, confidential or otherwise protected from use or disclosure. If you are not the intended recipient, you are strictly prohibited from reviewing, disclosing, copying using or disseminating any of this information or taking any action in reliance on or regarding this information. If you have received this fax in error, please notify us immediately by telephone so that we can arrange for its return to [place]. Phone: [phone], Toll-Free: [phone], Fax: [phone] Page: 3 of 3 Call Id: [id number] Comments User: Theophilus Kinds, RN Date/Time Eilene Ghazi Time): 12/08/2021 10:17:29 AM Let caller know that the on call MD said she can skip her Prednisone dose today and call tomorrow and update NP and decide how to proceed. States she has an endo she can consult regarding blood sugars. If her only  concern is about taking or not taking Prednisone, she can hold it today, she will get some lasting effects from the Prednisone. Caller verbalized understanding, said she is seeing her Endo tomorrow. Paging DoctorName Phone DateTime Result/ Outcome Message Type Notes Garret Reddish- MD [phone] 12/08/2021 10:10:28 AM Called On Call Provider - Reached Doctor Paged Garret Reddish- MD 12/08/2021 10:14:29 AM Spoke with On Call - General Message Result Notified MD that pt is on Prednisone and her blood sugars are high, highest 465, now 220's. MD said pt can skip her prednisone dose today and call tomorrow and update NP and decide how to proceed. States she has an endo she can consult regarding blood sugars. If her only concern is about taking or not taking prednisone, she can hold it today, she will get some lasting effects from the Prednisone

## 2021-12-09 NOTE — Telephone Encounter (Signed)
Noted.  If her wheezing/breathing gets worse then I recommend a lower dose daily for a few more days. Have her update this if this is the case.

## 2021-12-09 NOTE — Progress Notes (Signed)
Patient ID: Molly Wall, female   DOB: May 03, 1945, 76 y.o.   MRN: [id number]   HPI: Molly Wall is a 76 y.o.-year-old female, initially referred by her PCP, Alma Friendly, MD, returning for follow-up for DM2, dx at 18 (2017), h/o reactive hypoglycemia, insulin-dependent, uncontrolled, without long-term complications.  Last visit 4 months ago.  Interim history: She denies increased urination, blurry vision, nausea, chest pain.  She was on ABx for sinusitis prednisone over the weekend and sugars increased to 565.  She called the after-hours line wanting to stop the prednisone.  This was held.  This morning, sugars were in the 130s but they increase to 300s after she ate breakfast.  Reviewed HbA1c levels: Lab Results  Component Value Date   HGBA1C 7.4 (A) 08/08/2021   HGBA1C 7.2 (A) 04/04/2021   HGBA1C 7.1 (A) 01/01/2021   HGBA1C 6.8 (A) 09/20/2020   HGBA1C 8.1 (A) 06/21/2020   HGBA1C 8.7 (A) 03/19/2020   HGBA1C 8.2 (A) 09/07/2019   HGBA1C 9.4 (H) 06/06/2019   HGBA1C 9.1 (A) 02/08/2019   HGBA1C 9.6 (A) 10/27/2018   She is on: - Metformin ER 1000 mg with breakfast - Ozempic 0.5 >> 1 mg weekly  - VGo 30 >> ... >> TRV20 with 4-5 clicks before meal and 5-7 clicks before dinner Do not correct high blood sugars at night. If you have a late dinner, please use 5-6 units. If sugars before meals <60, do not take Novolog before that meal. If sugars before meals <90, try to move the bolus right before the meal  Previously on: - Lantus 35 >> 25 units in am >> 25 units in a.m. and 10 units at bedtime >> 20 units in a.m. and 10 units at night - Novolog 7-9 units before 15 min before meals >> B'fast: 6-8 units  Lunch: 10-11 units Dinner: 6-7 units  She checks her sugars more than 4 times a day with her CGM:   Prev.:    Lowest sugar was  40s >> 55 after b'fast >> 80s; she has hypoglycemia awareness in the 74s. Highest sugar was 500s ...>> 300s >>  565.  Glucometer: One Touch Verio  -No CKD, last BUN/creatinine:  Lab Results  Component Value Date   BUN 14 10/15/2021   BUN 15 04/16/2021   CREATININE 0.78 10/15/2021   CREATININE 0.67 04/16/2021  Not on ACE inhibitor/ARB.  -No HL; last set of lipids: Lab Results  Component Value Date   CHOL 187 10/31/2021   HDL 79.10 10/31/2021   LDLCALC 92 10/31/2021   TRIG 79.0 10/31/2021   CHOLHDL 2 10/31/2021  On Lipitor 20.  - last eye exam was in 10/2020: No DR.   -No numbness but has tingling in her feet. Last foot exam: 11/2020.  Pt has FH of DM in [place].  She has OSA and started a CPAP after which she felt much better. 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   She is on levothyroxine 88 mcg 1 tablet 6/7 days and 1.5 tablets 1/7 days.   Latest TSH was normal: Lab Results  Component Value Date   TSH 2.86 10/31/2021   She lives alone.  ROS: + See HPI  I reviewed pt's medications, allergies, PMH, social hx, family hx, and changes were documented in the  history of present illness. Otherwise, unchanged from my initial visit note.  Past Medical History:  Diagnosis Date   Allergic rhinitis    Allergy    Asthma    Atypical ductal hyperplasia of right breast    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   Breast cancer ([place])    Diabetes mellitus without complication ([place])    Family history of breast cancer    Family history of colon cancer    Hypercholesteremia    Hypothyroidism    Lumbar herniated disc    OSA (obstructive sleep apnea)    used to use CPAP, not any more over 1 1/2 yrs   Salivary stone    left side   Sleep apnea    no cpap in 2 yrs    Vitamin D deficiency    Past Surgical History:  Procedure Laterality Date   APPENDECTOMY     BREAST LUMPECTOMY WITH RADIOACTIVE SEED  LOCALIZATION Right 08/12/2017   Procedure: RIGHT BREAST LUMPECTOMY X'S 2 WITH RADIOACTIVE SEED LOCALIZATION X'S 2;  Surgeon: Fanny Skates, MD;  Location: [facility];  Service: General;  Laterality: Right;   COLONOSCOPY     11 yrs ago in [place] but given a 10 yr recall per pt    GANGLION CYST EXCISION     76 years old   [place]  2014   [place]   TONSILLECTOMY     age 57   Social History   Socioeconomic History   Marital status: Divorced    Spouse name: Not on file   Number of children: 2   Years of education: Not on file   Highest education level: Not on file  Occupational History   Not on file  Tobacco Use   Smoking status: Former Smoker    Packs/day: 1.00    Years: 2.00    Pack years: 2.00    Types: Cigarettes   Smokeless tobacco: Never Used   Tobacco comment: quite smoker at age 76  Substance and Sexual Activity   Alcohol use: Yes    Alcohol/week: 0.0 - 1.0 standard drinks    Comment: 0-1 per month   Drug use: No   Sexual activity: Not Currently  Other Topics Concern   Not on file  Social History Narrative   Single.   Moved from [place] to [place].   Family lives in [place].   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   Social Determinants of Health   Financial Resource Strain:    Difficulty of Paying Living Expenses: Not on file  Food Insecurity:    Worried About Charity fundraiser in the Last Year: Not on file   [employer] of Food in the Last Year: Not on file  Transportation Needs:    Lack of Transportation (Medical): Not on file   Lack of Transportation (Non-Medical): Not on file  Physical Activity:    Days of Exercise per Week: Not on file   Minutes of Exercise per Session: Not on file  Stress:    Feeling of Stress : Not on file  Social Connections:    Frequency of Communication with  Friends and Family: Not on file   Frequency of Social Gatherings with Friends and Family: Not on file   Attends Religious Services: Not on file   Active  Member of Clubs or Organizations: Not on file   Attends Archivist Meetings: Not on file   Marital Status: Not on file  Intimate Partner Violence:    Fear of Current or Ex-Partner: Not on file   Emotionally Abused: Not on file   Physically Abused: Not on file   Sexually Abused: Not on file   Current Outpatient Medications on File Prior to Visit  Medication Sig Dispense Refill   albuterol (VENTOLIN HFA) 108 (90 Base) MCG/ACT inhaler INHALE 2 PUFFS BY MOUTH EVERY 6 HOURS AS NEEDED FOR WHEEZE OR SHORTNESS OF BREATH 8 g 0   anastrozole (ARIMIDEX) 1 MG tablet Take 1 tablet (1 mg total) by mouth daily. 90 tablet 3   atorvastatin (LIPITOR) 20 MG tablet TAKE 1 TABLET BY MOUTH EVERY DAY FOR CHOLESTEROL. 90 tablet 3   Continuous Blood Gluc Receiver (DEXCOM G6 RECEIVER) DEVI Use to monitor blood sugar 1 each 0   Continuous Blood Gluc Sensor (DEXCOM G6 SENSOR) MISC Use as instructed change sensor every 10 days E11.65 9 each 3   Continuous Blood Gluc Transmit (DEXCOM G6 TRANSMITTER) MISC 1 Device by Does not apply route every 3 (three) months. 1 each 3   fluticasone (FLONASE) 50 MCG/ACT nasal spray INSTILL 1 SPRAY IN EACH NOSTRIL TWICE A DAY AS NEEDED FOR ALLERGIES OR RHINITIS 48 mL 0   fluticasone-salmeterol (WIXELA INHUB) 250-50 MCG/ACT AEPB TAKE 1 PUFF BY MOUTH TWICE A DAY 60 each 11   gabapentin (NEURONTIN) 100 MG capsule Take 1-3 capsules by mouth once to twice daily for pain. 90 capsule 0   insulin aspart (NOVOLOG) 100 UNIT/ML injection INJECT up to 70 UNITS INTO THE SKIN ONCE FOR 1 DOSE. VIA PUMP 90 mL 3   Insulin Disposable Pump (V-GO 20) 20 UNIT/24HR KIT USE 1X A DAY 90 kit 3   levothyroxine (SYNTHROID) 88 MCG tablet TAKE 1 AND 1/2 TABLET BY MOUTH EVERY SUNDAY. TAKE 1 TABLET BY MOUTH MON THROUGH SAT. TAKE ON AN EMPTY STOMACH WITH WATER  ONLY. NO FOOD OR OTHER MEDICATIONS FOR 30 MINUTES. 96 tablet 3   metFORMIN (GLUCOPHAGE-XR) 500 MG 24 hr tablet TAKE 2 TABLETS (1,000 MG TOTAL) BY MOUTH DAILY WITH BREAKFAST. FOR DIABETES. 180 tablet 1   montelukast (SINGULAIR) 10 MG tablet TAKE 1 TABLET (10 MG TOTAL) BY MOUTH AT BEDTIME. FOR ALLERGIES AND ASTHMA. 90 tablet 2   ONETOUCH VERIO test strip USE AS DIRECTED TO CHECK FASTING BLOOD SUGARS THREE TIMES DAILY. 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 10 tablet 0   No current facility-administered medications on file prior to visit.   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 She described it was a small amount in a milk carton in [place]. She states they stopped using it shortly after because the trial wasn't successful. SPM   Family History  Problem Relation Age of Onset   Alzheimer's disease Mother    Asthma Mother    Dementia Mother        cause of death   Arthritis Father    Heart attack Father 13       cause of death   Breast cancer Sister  39   Healthy Daughter    Colon cancer Paternal Grandmother    Liver cancer Maternal Uncle    Healthy Daughter    Healthy Brother    Healthy Sister    Colon polyps Neg Hx    Rectal cancer Neg Hx    Stomach cancer Neg Hx    PE: BP 122/70 (BP Location: Left Arm, Patient Position: Sitting, Cuff Size: Normal)   Pulse 78   Ht '5\' 5"'  (1.651 m)   Wt 203 lb 6.4 oz (92.3 kg)   SpO2 98%   BMI 33.85 kg/m  Wt Readings from Last 3 Encounters:  12/09/21 203 lb 6.4 oz (92.3 kg)  12/06/21 203 lb (92.1 kg)  11/20/21 204 lb 4 oz (92.6 kg)   Constitutional: overweight, in NAD Eyes:  EOMI, no exophthalmos ENT: no neck masses, no cervical  lymphadenopathy Cardiovascular: RRR, No MRG Respiratory: CTA B except very faint wheezes in the right lower lung Musculoskeletal: no deformities Skin:no rashes Neurological: no tremor with outstretched hands Diabetic Foot Exam - Simple   Simple Foot Form Diabetic Foot exam was performed with the following findings: Yes 12/09/2021  1:42 PM  Visual Inspection No deformities, no ulcerations, no other skin breakdown bilaterally: Yes Sensation Testing Intact to touch and monofilament testing bilaterally: Yes Pulse Check Posterior Tibialis and Dorsalis pulse intact bilaterally: Yes Comments    ASSESSMENT: 1. DM2, insulin-dependent, uncontrolled, without long-term complications, but with hyperglycemia  2. HL  3. Obesity class 1  PLAN:  1. Patient with longstanding, insulin-dependent, diabetes, on basal/bolus insulin regimen and weekly GLP-1 receptor agonist with very fluctuating blood sugars, and a higher HbA1c at last visit, at 7.4%.  We have her on a V-Go insulin pump.  Other plans were too expensive.  At last visit sugars appear to be more controlled during the day but they were increasing especially after dinner and remained high the entire night.  She was still correcting blood sugars at bedtime and sugars were occasionally plummeting by morning.  I advised her not to correct with more than 2 to 3 units of insulin.  She feels that her patterns were consistent with erratic meals, especially at night.  She did not feel that she needed a change in her regimen but was planning to improve diet.  We discussed about possibly increasing Ozempic dose, but we did not do so due to constipation. CGM interpretation: -At today's visit, we reviewed her CGM downloads: It appears that 35% of values are in target range (goal >70%), while the 5% are higher than 180 (goal <25%), and 0% are lower than 70 (goal <4%).  The calculated average blood sugar is 222.  The projected HbA1c for the next 3 months ([facility]) is  8.6%. -Reviewing the CGM trends, sugars are improving overnight but then increasing significantly after breakfast and especially later in the afternoon.  They improve after dinner.  She has been on high-dose steroids for only 1 day apparently, but this was enough to increase her blood sugars to the 500s.  The sugars were higher even before starting prednisone, most likely due to her sinusitis.  However, she mentions that after our last visit, sugars definitely improved and I can see that 4 weeks ago, they were much better, only increasing after dinner. -At today's visit, I advised her to use the maximum amount of VGo insulin, 6 clicks (12 units) per meal but to add extra NovoLog to cover her meals especially now that she is on steroids.  We  gave her a sample pen.  She may need to continue with this even beyond this hyperglycemic period. -We will continue metformin and Ozempic for now - I suggested to:  Patient Instructions  Please continue: - Metformin ER 1000 mg with breakfast - Ozempic 1 mg weekly - MIT94 with 6 clicks before meal  Do not correct high blood sugars at bedtime. If you have a late dinner, please use 5-6 units. If sugars before meals <60, do not take Novolog before that meal. If sugars before meals <90, try to move the bolus right before the meal  Please use NovoLog 6-12 units along with the VGo boluses before meals. You can also use this for correction.  Please return in 3 months.   - we checked her HbA1c: 8.4% (higher) - advised to check sugars at different times of the day - 4x a day, rotating check times - advised for yearly eye exams >> she is UTD - return to clinic in 3-4 months  2. HL -Reviewed her latest lipid panel from 10/2021: At goal: Lab Results  Component Value Date   CHOL 187 10/31/2021   HDL 79.10 10/31/2021   LDLCALC 92 10/31/2021   TRIG 79.0 10/31/2021   CHOLHDL 2 10/31/2021  -She continues on Lipitor 20 mg daily without side effects  3.  Obesity  class I -Continues on Ozempic which should also help with weight loss -She follows up on a more plant-based diet -She lost approximately 18 pounds in the  year prior to our last visit -She gained 5 pounds since last visit  Philemon Kingdom, MD PhD [facility] End[facility]

## 2021-12-09 NOTE — Telephone Encounter (Signed)
Patient was advised to discontinue by endocrinology. Spoke with patient and informed her to call back if symptoms continue.

## 2021-12-12 ENCOUNTER — Ambulatory Visit: Payer: Medicare Other | Attending: Rheumatology | Admitting: Rheumatology

## 2021-12-12 ENCOUNTER — Encounter: Payer: Self-pay | Admitting: Rheumatology

## 2021-12-12 VITALS — BP 118/65 | HR 84 | Resp 16 | Ht 65.0 in | Wt 203.6 lb

## 2021-12-12 DIAGNOSIS — M65331 Trigger finger, right middle finger: Secondary | ICD-10-CM | POA: Diagnosis present

## 2021-12-12 DIAGNOSIS — M25562 Pain in left knee: Secondary | ICD-10-CM | POA: Insufficient documentation

## 2021-12-12 DIAGNOSIS — M4310 Spondylolisthesis, site unspecified: Secondary | ICD-10-CM

## 2021-12-12 DIAGNOSIS — Z794 Long term (current) use of insulin: Secondary | ICD-10-CM | POA: Diagnosis present

## 2021-12-12 DIAGNOSIS — E559 Vitamin D deficiency, unspecified: Secondary | ICD-10-CM

## 2021-12-12 DIAGNOSIS — N6091 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: Secondary | ICD-10-CM

## 2021-12-12 DIAGNOSIS — M47816 Spondylosis without myelopathy or radiculopathy, lumbar region: Secondary | ICD-10-CM

## 2021-12-12 DIAGNOSIS — J4521 Mild intermittent asthma with (acute) exacerbation: Secondary | ICD-10-CM | POA: Diagnosis present

## 2021-12-12 DIAGNOSIS — M65311 Trigger thumb, right thumb: Secondary | ICD-10-CM

## 2021-12-12 DIAGNOSIS — M19041 Primary osteoarthritis, right hand: Secondary | ICD-10-CM | POA: Insufficient documentation

## 2021-12-12 DIAGNOSIS — G894 Chronic pain syndrome: Secondary | ICD-10-CM

## 2021-12-12 DIAGNOSIS — R768 Other specified abnormal immunological findings in serum: Secondary | ICD-10-CM | POA: Diagnosis present

## 2021-12-12 DIAGNOSIS — Z8639 Personal history of other endocrine, nutritional and metabolic disease: Secondary | ICD-10-CM

## 2021-12-12 DIAGNOSIS — M25561 Pain in right knee: Secondary | ICD-10-CM | POA: Diagnosis present

## 2021-12-12 DIAGNOSIS — Q667 Congenital pes cavus, unspecified foot: Secondary | ICD-10-CM

## 2021-12-12 DIAGNOSIS — E1165 Type 2 diabetes mellitus with hyperglycemia: Secondary | ICD-10-CM | POA: Diagnosis present

## 2021-12-12 DIAGNOSIS — Z800 Family history of malignant neoplasm of digestive organs: Secondary | ICD-10-CM

## 2021-12-12 DIAGNOSIS — M65312 Trigger thumb, left thumb: Secondary | ICD-10-CM

## 2021-12-12 DIAGNOSIS — M19042 Primary osteoarthritis, left hand: Secondary | ICD-10-CM

## 2021-12-12 DIAGNOSIS — Z803 Family history of malignant neoplasm of breast: Secondary | ICD-10-CM

## 2021-12-12 NOTE — Patient Instructions (Signed)
Hand Exercises Hand exercises can be helpful for almost anyone. These exercises can strengthen the hands, improve flexibility and movement, and increase blood flow to the hands. These results can make work and daily tasks easier. Hand exercises can be especially helpful for people who have joint pain from arthritis or have nerve damage from overuse (carpal tunnel syndrome). These exercises can also help people who have injured a hand. Exercises Most of these hand exercises are gentle stretching and motion exercises. It is usually safe to do them often throughout the day. Warming up your hands before exercise may help to reduce stiffness. You can do this with gentle massage or by placing your hands in warm water for 10-15 minutes. It is normal to feel some stretching, pulling, tightness, or mild discomfort as you begin new exercises. This will gradually improve. Stop an exercise right away if you feel sudden, severe pain or your pain gets worse. Ask your health care provider which exercises are best for you. Knuckle bend or "claw" fist  Stand or sit with your arm, hand, and all five fingers pointed straight up. Make sure to keep your wrist straight during the exercise. Gently bend your fingers down toward your palm until the tips of your fingers are touching the top of your palm. Keep your big knuckle straight and just bend the small knuckles in your fingers. Hold this position for __________ seconds. Straighten (extend) your fingers back to the starting position. Repeat this exercise 5-10 times with each hand. Full finger fist  Stand or sit with your arm, hand, and all five fingers pointed straight up. Make sure to keep your wrist straight during the exercise. Gently bend your fingers into your palm until the tips of your fingers are touching the middle of your palm. Hold this position for __________ seconds. Extend your fingers back to the starting position, stretching every joint fully. Repeat  this exercise 5-10 times with each hand. Straight fist Stand or sit with your arm, hand, and all five fingers pointed straight up. Make sure to keep your wrist straight during the exercise. Gently bend your fingers at the big knuckle, where your fingers meet your hand, and the middle knuckle. Keep the knuckle at the tips of your fingers straight and try to touch the bottom of your palm. Hold this position for __________ seconds. Extend your fingers back to the starting position, stretching every joint fully. Repeat this exercise 5-10 times with each hand. Tabletop  Stand or sit with your arm, hand, and all five fingers pointed straight up. Make sure to keep your wrist straight during the exercise. Gently bend your fingers at the big knuckle, where your fingers meet your hand, as far down as you can while keeping the small knuckles in your fingers straight. Think of forming a tabletop with your fingers. Hold this position for __________ seconds. Extend your fingers back to the starting position, stretching every joint fully. Repeat this exercise 5-10 times with each hand. Finger spread  Place your hand flat on a table with your palm facing down. Make sure your wrist stays straight as you do this exercise. Spread your fingers and thumb apart from each other as far as you can until you feel a gentle stretch. Hold this position for __________ seconds. Bring your fingers and thumb tight together again. Hold this position for __________ seconds. Repeat this exercise 5-10 times with each hand. Making circles  Stand or sit with your arm, hand, and all five fingers pointed   straight up. Make sure to keep your wrist straight during the exercise. Make a circle by touching the tip of your thumb to the tip of your index finger. Hold for __________ seconds. Then open your hand wide. Repeat this motion with your thumb and each finger on your hand. Repeat this exercise 5-10 times with each hand. Thumb  motion  Sit with your forearm resting on a table and your wrist straight. Your thumb should be facing up toward the ceiling. Keep your fingers relaxed as you move your thumb. Lift your thumb up as high as you can toward the ceiling. Hold for __________ seconds. Bend your thumb across your palm as far as you can, reaching the tip of your thumb for the small finger (pinkie) side of your palm. Hold for __________ seconds. Repeat this exercise 5-10 times with each hand. Grip strengthening  Hold a stress ball or other soft ball in the middle of your hand. Slowly increase the pressure, squeezing the ball as much as you can without causing pain. Think of bringing the tips of your fingers into the middle of your palm. All of your finger joints should bend when doing this exercise. Hold your squeeze for __________ seconds, then relax. Repeat this exercise 5-10 times with each hand. Contact a health care provider if: Your hand pain or discomfort gets much worse when you do an exercise. Your hand pain or discomfort does not improve within 2 hours after you exercise. If you have any of these problems, stop doing these exercises right away. Do not do them again unless your health care provider says that you can. Get help right away if: You develop sudden, severe hand pain or swelling. If this happens, stop doing these exercises right away. Do not do them again unless your health care provider says that you can. This information is not intended to replace advice given to you by your health care provider. Make sure you discuss any questions you have with your health care provider. Document Revised: 06/21/2020 Document Reviewed: 06/21/2020 Elsevier Patient Education  2023 [employer]  

## 2021-12-24 ENCOUNTER — Telehealth: Payer: Self-pay

## 2021-12-24 NOTE — Telephone Encounter (Signed)
Inbound fax from [facility] requesting recent clinical notes. Notes routed via Epic to [phone].

## 2021-12-25 ENCOUNTER — Other Ambulatory Visit: Payer: Self-pay | Admitting: Internal Medicine

## 2021-12-25 ENCOUNTER — Other Ambulatory Visit: Payer: Self-pay | Admitting: Primary Care

## 2021-12-25 DIAGNOSIS — J4520 Mild intermittent asthma, uncomplicated: Secondary | ICD-10-CM

## 2021-12-25 DIAGNOSIS — E119 Type 2 diabetes mellitus without complications: Secondary | ICD-10-CM

## 2021-12-26 ENCOUNTER — Other Ambulatory Visit: Payer: Self-pay | Admitting: Primary Care

## 2021-12-26 DIAGNOSIS — J302 Other seasonal allergic rhinitis: Secondary | ICD-10-CM

## 2021-12-27 NOTE — Telephone Encounter (Signed)
Inbound fax from [facility] requesting recent clinical notes. Notes routed via Epic to [phone].

## 2022-01-21 ENCOUNTER — Telehealth: Payer: Self-pay

## 2022-01-21 NOTE — Telephone Encounter (Signed)
Pt contacted and advised provider reviewed Dexcom readings and suggest she replace her V-Go immediately and administer 8 units for correction with a syringe. Pt advised to check her blood sugar in 2 hours to monitor. If at the time her sugars are still high she can administer 6 additional units. Pt advised to contact the office tomorrow if her readings do not improve overnight. Mychart message sent as well

## 2022-01-21 NOTE — Telephone Encounter (Signed)
Pt left a vm advising hetr blood sugars have been running high since last night. It started at 471  most of the night pt reports her readings were between 430-470. This morning it was 524 and today her readings have been fluctuating. Pt advised she is taking her insulin as prescribed and she is not sure why her sugars are running so high. Pt is connected to Dexcom. She has verified readings with finger sticks. Pt also advised she took some stool softener and suppositories.

## 2022-01-23 ENCOUNTER — Telehealth: Payer: Self-pay | Admitting: Primary Care

## 2022-01-23 ENCOUNTER — Ambulatory Visit (INDEPENDENT_AMBULATORY_CARE_PROVIDER_SITE_OTHER)
Admission: RE | Admit: 2022-01-23 | Discharge: 2022-01-23 | Disposition: A | Discharge status: Home / Self Care | Payer: Medicare Other | Source: Ambulatory Visit | Attending: Family | Admitting: Family

## 2022-01-23 ENCOUNTER — Ambulatory Visit (INDEPENDENT_AMBULATORY_CARE_PROVIDER_SITE_OTHER): Payer: Medicare Other | Admitting: Family

## 2022-01-23 ENCOUNTER — Encounter: Payer: Self-pay | Admitting: Family

## 2022-01-23 VITALS — BP 122/60 | HR 78 | Temp 98.2°F | Resp 16 | Ht 65.0 in | Wt 202.0 lb

## 2022-01-23 DIAGNOSIS — R1111 Vomiting without nausea: Secondary | ICD-10-CM

## 2022-01-23 DIAGNOSIS — K5900 Constipation, unspecified: Secondary | ICD-10-CM

## 2022-01-23 DIAGNOSIS — R1011 Right upper quadrant pain: Secondary | ICD-10-CM

## 2022-01-23 DIAGNOSIS — R300 Dysuria: Secondary | ICD-10-CM

## 2022-01-23 DIAGNOSIS — R1013 Epigastric pain: Secondary | ICD-10-CM | POA: Diagnosis not present

## 2022-01-23 LAB — AMYLASE: Amylase: 27 U/L (ref 27–131)

## 2022-01-23 LAB — COMPREHENSIVE METABOLIC PANEL
ALT: 11 U/L (ref 0–35)
AST: 14 U/L (ref 0–37)
Albumin: 4.2 g/dL (ref 3.5–5.2)
Alkaline Phosphatase: 95 U/L (ref 39–117)
BUN: 12 mg/dL (ref 6–23)
CO2: 31 mEq/L (ref 19–32)
Calcium: 9.3 mg/dL (ref 8.4–10.5)
Chloride: 96 mEq/L (ref 96–112)
Creatinine, Ser: 0.71 mg/dL (ref 0.40–1.20)
GFR: 84.01 mL/min (ref >60.00)
Glucose, Bld: 289 mg/dL — ABNORMAL HIGH (ref 70–99)
Potassium: 4.7 mEq/L (ref 3.5–5.1)
Sodium: 133 mEq/L — ABNORMAL LOW (ref 135–145)
Total Bilirubin: 0.5 mg/dL (ref 0.2–1.2)
Total Protein: 6.8 g/dL (ref 6.0–8.3)

## 2022-01-23 LAB — CBC
HCT: 43.2 % (ref 36.0–46.0)
Hemoglobin: 13.9 g/dL (ref 12.0–15.0)
MCHC: 32.1 g/dL (ref 30.0–36.0)
MCV: 86.2 fl (ref 78.0–100.0)
Platelets: 222 10*3/uL (ref 150.0–400.0)
RBC: 5.01 Mil/uL (ref 3.87–5.11)
RDW: 13.7 % (ref 11.5–15.5)
WBC: 5 10*3/uL (ref 4.0–10.5)

## 2022-01-23 LAB — LIPASE: Lipase: 12 U/L (ref 11.0–59.0)

## 2022-01-23 NOTE — Progress Notes (Signed)
Established Patient Office Visit  Subjective:  Patient ID: Molly Wall, female    DOB: [birth year]  Age: 76 y.o. MRN: [id number]  CC:  Chief Complaint  Patient presents with   Constipation    Been taking stool softener and they are not helping. Pt has pain, gas, and cramping.     HPI Molly Wall is here today with concerns.   Recent episode of constipation, started about one week ago. She is having very small episodes of bowel movement, but very small and not full relief. She wil have diarrhea at times and or very small amount. She will go 2-3 days without this as well. She has a lot of gas burping and farting. She did throw up last night. She does have pretty good amount of abdominal pain.   She is drinking a good amount of water. She was taking a stool softener, stopped after 7 days.    Past Medical History:  Diagnosis Date   Allergic rhinitis    Allergy    Asthma    Atypical ductal hyperplasia of right breast    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   Breast cancer ([place])    Diabetes mellitus without complication ([place])    Family history of breast cancer    Family history of colon cancer    Hypercholesteremia    Hypothyroidism    Lumbar herniated disc    OSA (obstructive sleep apnea)    used to use CPAP, not any more over 1 1/2 yrs   Salivary stone    left side   Sleep apnea    no cpap in 2 yrs    Vitamin D deficiency     Past Surgical History:  Procedure Laterality Date   APPENDECTOMY     BREAST LUMPECTOMY WITH RADIOACTIVE SEED LOCALIZATION Right 08/12/2017   Procedure: RIGHT BREAST LUMPECTOMY X'S 2 WITH RADIOACTIVE SEED LOCALIZATION X'S 2;  Surgeon: Fanny Skates, MD;  Location: [facility];  Service: General;  Laterality: Right;   COLONOSCOPY     11 yrs ago in [place] - polyps but given a 10 yr recall per pt    GANGLION CYST EXCISION     76 years old   [place]  2014   [place]     age 71    Family History  Problem Relation Age of Onset   Alzheimer's disease Mother    Asthma Mother    Dementia Mother        cause of death   Arthritis Father    Heart attack Father 84       cause of death   Breast cancer Sister 49   Healthy Daughter    Colon cancer Paternal Grandmother    Liver cancer Maternal Uncle    Healthy Daughter    Healthy Brother    Healthy Sister    Colon polyps Neg Hx    Rectal cancer Neg Hx    Stomach cancer Neg Hx     Social History   Socioeconomic History   Marital status: Divorced    Spouse name: Not on file   Number of children: 2   Years of education: Not on file   Highest education level: Not on file  Occupational History   Not on file  Tobacco Use   Smoking status: Never    Passive exposure: Never  Smokeless tobacco: Never  Vaping Use   Vaping Use: Never used  Substance and Sexual Activity   Alcohol use: Yes    Alcohol/week: 0.0 - 1.0 standard drinks of alcohol    Comment: rarely   Drug use: No   Sexual activity: Not Currently  Other Topics Concern   Not on file  Social History Narrative   Single.   Moved from [place] to [place] several weeks ago.   Family lives in [place].   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   Social Determinants of Health   Financial Resource Strain: Low Risk  (10/18/2021)   Overall Financial Resource Strain (CARDIA)    Difficulty of Paying Living Expenses: Not hard at all  Food Insecurity: No Food Insecurity (10/18/2021)   Hunger Vital Sign    Worried About Running Out of Food in the Last Year: Never true    Ran Out of Food in the Last Year: Never true  Transportation Needs: No Transportation Needs (10/18/2021)   PRAPARE - Hydrologist (Medical): No    Lack of Transportation (Non-Medical): No  Physical Activity: Inactive  (10/18/2021)   Exercise Vital Sign    Days of Exercise per Week: 0 days    Minutes of Exercise per Session: 0 min  Stress: No Stress Concern Present (10/18/2021)   [place]    Feeling of Stress : Not at all  Social Connections: Not on file  Intimate Partner Violence: Not on file    Outpatient Medications Prior to Visit  Medication Sig Dispense Refill   albuterol (VENTOLIN HFA) 108 (90 Base) MCG/ACT inhaler INHALE 2 PUFFS BY MOUTH EVERY 6 HOURS AS NEEDED FOR WHEEZE OR SHORTNESS OF BREATH 8 g 0   anastrozole (ARIMIDEX) 1 MG tablet Take 1 tablet (1 mg total) by mouth daily. 90 tablet 3   atorvastatin (LIPITOR) 20 MG tablet TAKE 1 TABLET BY MOUTH EVERY DAY FOR CHOLESTEROL. 90 tablet 3   Continuous Blood Gluc Receiver (DEXCOM G6 RECEIVER) DEVI Use to monitor blood sugar 1 each 0   Continuous Blood Gluc Sensor (DEXCOM G6 SENSOR) MISC Use as instructed change sensor every 10 days E11.65 9 each 3   Continuous Blood Gluc Transmit (DEXCOM G6 TRANSMITTER) MISC 1 Device by Does not apply route every 3 (three) months. 1 each 3   fluticasone (FLONASE) 50 MCG/ACT nasal spray INSTILL 1 SPRAY IN EACH NOSTRIL TWICE A DAY AS NEEDED FOR ALLERGIES OR RHINITIS 48 mL 1   fluticasone-salmeterol (WIXELA INHUB) 250-50 MCG/ACT AEPB INHALE 1 PUFF BY MOUTH TWICE A DAY 60 each 9   gabapentin (NEURONTIN) 100 MG capsule Take 1-3 capsules by mouth once to twice daily for pain. 90 capsule 0   insulin aspart (NOVOLOG) 100 UNIT/ML injection INJECT up to 70 UNITS INTO THE SKIN ONCE FOR 1 DOSE. VIA PUMP 90 mL 3   Insulin Disposable Pump (V-GO 20) 20 UNIT/24HR KIT USE 1X A DAY 90 kit 3   Insulin Pen Needle 32G X 4 MM MISC Use 3x a day 100 each 3   levothyroxine (SYNTHROID) 88 MCG tablet TAKE 1 AND 1/2 TABLET BY MOUTH EVERY SUNDAY. TAKE 1 TABLET BY MOUTH MON THROUGH SAT. TAKE ON AN EMPTY STOMACH WITH WATER ONLY. NO FOOD OR OTHER MEDICATIONS FOR 30 MINUTES. 96 tablet 3    metFORMIN (GLUCOPHAGE-XR) 500 MG 24 hr tablet  TAKE 2 TABLETS (1,000 MG TOTAL) BY MOUTH DAILY WITH BREAKFAST. FOR DIABETES. 180 tablet 1   montelukast (SINGULAIR) 10 MG tablet TAKE 1 TABLET (10 MG TOTAL) BY MOUTH AT BEDTIME. FOR ALLERGIES AND ASTHMA. 90 tablet 2   ONETOUCH VERIO test strip USE AS DIRECTED TO CHECK FASTING BLOOD SUGARS THREE TIMES DAILY. 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 (Patient not taking: Reported on 01/23/2022) 10 tablet 0   No facility-administered medications prior to visit.    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 She described it was a small amount in a milk carton in [place]. She states they stopped using it shortly after because the trial wasn't successful. SPM   Prednisone     Make sugar jump way up.        Objective:    Physical Exam Constitutional:      Appearance: Normal appearance. She is obese.  Cardiovascular:     Rate and Rhythm: Normal rate and regular rhythm.  Pulmonary:     Effort: Pulmonary effort is normal.     Breath sounds: Normal breath sounds.  Abdominal:     General: Abdomen is flat. Bowel sounds are increased.     Tenderness: There is abdominal tenderness in the right upper quadrant, epigastric area and left upper quadrant.     Comments: Decreased if absent bowel sounds ruq  Moderate tenderness to epigastric area  Neurological:     Mental Status: She is alert.     BP 122/60   Pulse 78   Temp 98.2 F (36.8 C)   Resp 16   Ht _0  (1.651 m)   Wt 202 lb (91.6 kg)   SpO2 98%   BMI 33.61 kg/m  Wt Readings from Last 3 Encounters:  01/23/22 202 lb (91.6 kg)  12/12/21 203 lb 9.6 oz (92.4 kg)  12/09/21 203 lb 6.4 oz (92.3 kg)      Health Maintenance Due  Topic Date Due   OPHTHALMOLOGY EXAM  01/21/2022   [place]  There are no preventive care reminders to display for this patient.  Lab Results  Component Value Date   TSH 2.86 10/31/2021   Lab Results  Component Value Date   WBC 5.0 01/23/2022   HGB 13.9 01/23/2022   HCT 43.2 01/23/2022   MCV 86.2 01/23/2022   PLT 222.0 01/23/2022   Lab Results  Component Value Date   NA 133 (L) 01/23/2022   K 4.7 01/23/2022   CO2 31 01/23/2022   GLUCOSE 289 (H) 01/23/2022   BUN 12 01/23/2022   CREATININE 0.71 01/23/2022   BILITOT 0.5 01/23/2022   ALKPHOS 95 01/23/2022   AST 14 01/23/2022   ALT 11 01/23/2022   PROT 6.8 01/23/2022   ALBUMIN 4.2 01/23/2022   CALCIUM 9.3 01/23/2022   ANIONGAP 6 10/15/2021   GFR 84.01 01/23/2022   Lab Results  Component Value Date   HGBA1C 8.4 (A) 12/09/2021      Assessment & Plan:   Problem List Items Addressed This Visit       Other   Constipation   RUQ abdominal pain    Lab order workup in place Urine culture  to r/o uti R/o liver/gallbladder or pancreatic etiology Constipation/r/o obstruction ordering kub as well  D/w pt on regimen for constipation  Recommended daily stool softener and miralax  Mag citrate if no relief.  D/w pt red flag symptoms If neg workup may consider Ct abd pelvis      Relevant Orders   CBC (Completed)   Comprehensive metabolic panel (Completed)   Amylase (Completed)   Lipase (Completed)   Urine Culture (Completed)   DG Abd 1 View (Completed)   Other Visit Diagnoses     Dysuria    -  Primary   Relevant Orders   Urine Culture (Completed)   Epigastric pain       Relevant Orders   CBC (Completed)   Comprehensive metabolic panel (Completed)   Amylase (Completed)   Lipase (Completed)   Urine Culture (Completed)   DG Abd 1 View (Completed)   Vomiting without nausea, unspecified vomiting type       Relevant Orders   DG Abd 1 View (Completed)       No orders of the defined  types were placed in this encounter.   Follow-up: Return in about 1 week (around 01/30/2022) for with Allie Bossier for f/u constipation .    Eugenia Pancoast, FNP

## 2022-01-23 NOTE — Telephone Encounter (Signed)
Patient called to get lab results.

## 2022-01-23 NOTE — Patient Instructions (Signed)
Stop by the lab prior to leaving today. I will notify you of your results once received.  Complete xray(s) prior to leaving today. I will notify you of your results once received.   Stop by the lab prior to leaving today. I will notify you of your results once received.   Add fiber supplement once daily.  Add a probiotic (such as Florastor) daily. Drink 64 oz of water a day. Eat lots of fresh fruit and veggies. Ensure regular exercise.    If you are not able to have regular BM's with the above regimen, you may add miralax 1 tablespoon daily.  Increase or decrease amount/frequency as needed to ensure 1 soft BM/day.  If still not with relief get magnesium citrate, take 1/2 bottle then one hour later take other half.    Regards,   Eugenia Pancoast FNP-C

## 2022-01-23 NOTE — Telephone Encounter (Signed)
Pt was seen in office today. Her results will show on her mychart before we get the results. Will call when Lawerance Bach looks at them.

## 2022-01-24 LAB — URINE CULTURE
MICRO NUMBER:: 14167532
SPECIMEN QUALITY:: ADEQUATE

## 2022-01-24 NOTE — Progress Notes (Signed)
Sodium on lower end, but not terrible.  Pancreatic enzymes normal.  No elevation wbc to suggest infection  Pt with constipation, ask if she has had any relief? Has she started miralax? Is she still having pains?

## 2022-01-24 NOTE — Progress Notes (Signed)
Glad to hear this. Please advise pt that if over the weekend no complete bowel movement may also try magnesium citrate which she would take 1/2 bottle, if no relief of bowels, take other half in the next one hour.

## 2022-01-31 DIAGNOSIS — R1011 Right upper quadrant pain: Secondary | ICD-10-CM | POA: Insufficient documentation

## 2022-01-31 NOTE — Assessment & Plan Note (Signed)
Lab order workup in place Urine culture to r/o uti R/o liver/gallbladder or pancreatic etiology Constipation/r/o obstruction ordering kub as well  D/w pt on regimen for constipation  Recommended daily stool softener and miralax  Mag citrate if no relief.  D/w pt red flag symptoms If neg workup may consider Ct abd pelvis

## 2022-04-07 ENCOUNTER — Other Ambulatory Visit: Payer: Self-pay | Admitting: Primary Care

## 2022-04-07 DIAGNOSIS — J4520 Mild intermittent asthma, uncomplicated: Secondary | ICD-10-CM

## 2022-04-15 ENCOUNTER — Encounter: Payer: Self-pay | Admitting: Internal Medicine

## 2022-04-15 ENCOUNTER — Ambulatory Visit (INDEPENDENT_AMBULATORY_CARE_PROVIDER_SITE_OTHER): Payer: Medicare Other | Admitting: Internal Medicine

## 2022-04-15 ENCOUNTER — Other Ambulatory Visit: Payer: Self-pay

## 2022-04-15 ENCOUNTER — Telehealth: Payer: Self-pay | Admitting: Internal Medicine

## 2022-04-15 VITALS — BP 124/76 | HR 71 | Ht 65.0 in | Wt 206.4 lb

## 2022-04-15 DIAGNOSIS — E1165 Type 2 diabetes mellitus with hyperglycemia: Secondary | ICD-10-CM

## 2022-04-15 DIAGNOSIS — E669 Obesity, unspecified: Secondary | ICD-10-CM

## 2022-04-15 DIAGNOSIS — Z794 Long term (current) use of insulin: Secondary | ICD-10-CM | POA: Diagnosis not present

## 2022-04-15 DIAGNOSIS — E785 Hyperlipidemia, unspecified: Secondary | ICD-10-CM | POA: Diagnosis not present

## 2022-04-15 LAB — POCT GLYCOSYLATED HEMOGLOBIN (HGB A1C): Hemoglobin A1C: 7.7 % — AB (ref 4.0–5.6)

## 2022-04-15 MED ORDER — NOVOLOG FLEXPEN 100 UNIT/ML ~~LOC~~ SOPN: 25.0000 [IU] | PEN_INJECTOR | Freq: Three times a day (TID) | SUBCUTANEOUS | 99 refills | Status: DC

## 2022-04-15 NOTE — Patient Instructions (Addendum)
Please continue: - Metformin ER 1000 mg with breakfast - Ozempic 1 mg weekly - [id number] with 5-7 clicks before meals +/- Novolog 2-4 units for correction  For dinner, take 7-8 clicks (53-96 units). If sugars before meals <60, do not take Novolog before that meal. If sugars before meals <90, try to move the bolus right before the meal  Please return in 3 months.

## 2022-04-15 NOTE — Progress Notes (Signed)
Patient ID: Molly Wall, female   DOB: Nov 13, 1945, 77 y.o.   MRN: [id number]   HPI: Molly Wall is a 77 y.o.-year-old female, initially referred by her PCP, Alma Friendly, MD, returning for follow-up for DM2, dx at 13 (2017), h/o reactive hypoglycemia, insulin-dependent, uncontrolled, without long-term complications.  Last visit 4 months ago.  Interim history: She denies increased urination, blurry vision, nausea, chest pain. She has constipation. Tried metamucin, dulcolax, enemas, now using a supplement by Dr. Ardeth Perfect >> helping.  She had significant stress in the last 2 days as she found out that her 50-year-old niece is very sick.  Sugar increased to 500s.  Reviewed HbA1c levels: Lab Results  Component Value Date   HGBA1C 8.4 (A) 12/09/2021   HGBA1C 7.4 (A) 08/08/2021   HGBA1C 7.2 (A) 04/04/2021   HGBA1C 7.1 (A) 01/01/2021   HGBA1C 6.8 (A) 09/20/2020   HGBA1C 8.1 (A) 06/21/2020   HGBA1C 8.7 (A) 03/19/2020   HGBA1C 8.2 (A) 09/07/2019   HGBA1C 9.4 (H) 06/06/2019   HGBA1C 9.1 (A) 02/08/2019   She is on: - Metformin ER 1000 mg with breakfast - Ozempic 0.5 >> 1 mg weekly  - VGo 30 >> ... >> [id number] with 4-5 clicks before meal and 5-7 clicks before dinner; also use 6-12 units of NovoLog before meals - with pen (for when on steroids). Do not correct high blood sugars at night. If you have a late dinner, please use 5-6 units. If sugars before meals <60, do not take Novolog before that meal. If sugars before meals <90, try to move the bolus right before the meal  Previously on: - Lantus 35 >> 25 units in am >> 25 units in a.m. and 10 units at bedtime >> 20 units in a.m. and 10 units at night - Novolog 7-9 units before 15 min before meals >> B'fast: 6-8 units  Lunch: 10-11 units Dinner: 6-7 units  She checks her sugars more than 4 times a day with her CGM:  Previously:  Prev.:  Lowest sugar was  40s >> 55 after b'fast >> 80s >> 40s; she has hypoglycemia  awareness in the 80s. Highest sugar was 500s ...>> 300s >> 565 >> 500s.  Glucometer: One Touch Verio  -No CKD, last BUN/creatinine:  Lab Results  Component Value Date   BUN 12 01/23/2022   BUN 14 10/15/2021   CREATININE 0.71 01/23/2022   CREATININE 0.78 10/15/2021  Not on ACE inhibitor/ARB.  -No HL; last set of lipids: Lab Results  Component Value Date   CHOL 187 10/31/2021   HDL 79.10 10/31/2021   LDLCALC 92 10/31/2021   TRIG 79.0 10/31/2021   CHOLHDL 2 10/31/2021  On Lipitor 20.  - last eye exam was in 10/2021: No DR reportedly, + cataracts.   -No numbness but has tingling in her feet. Last foot exam: 12/09/2021.  Pt has FH of DM in [place].  She has OSA and started a CPAP after which she felt much better. 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   She is on levothyroxine 88 mcg 1 tablet 6/7 days and 1.5 tablets 1/7 days.   Latest TSH was normal: Lab Results  Component Value Date   TSH 2.86 10/31/2021   She lives alone.  ROS: + See HPI  I  reviewed pt's medications, allergies, PMH, social hx, family hx, and changes were documented in the history of present illness. Otherwise, unchanged from my initial visit note.  Past Medical History:  Diagnosis Date   Allergic rhinitis    Allergy    Asthma    Atypical ductal hyperplasia of right breast    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   Breast cancer ([place])    Diabetes mellitus without complication ([place])    Family history of breast cancer    Family history of colon cancer    Hypercholesteremia    Hypothyroidism    Lumbar herniated disc    OSA (obstructive sleep apnea)    used to use CPAP, not any more over 1 1/2 yrs   Salivary stone    left side   Sleep apnea    no cpap in 2 yrs    Vitamin D deficiency    Past Surgical  History:  Procedure Laterality Date   APPENDECTOMY     BREAST LUMPECTOMY WITH RADIOACTIVE SEED LOCALIZATION Right 08/12/2017   Procedure: RIGHT BREAST LUMPECTOMY X'S 2 WITH RADIOACTIVE SEED LOCALIZATION X'S 2;  Surgeon: Fanny Skates, MD;  Location: [facility];  Service: General;  Laterality: Right;   COLONOSCOPY     11 yrs ago in [place] but given a 10 yr recall per pt    GANGLION CYST EXCISION     77 years old   [place]  2014   [place]   TONSILLECTOMY     age 78   Social History   Socioeconomic History   Marital status: Divorced    Spouse name: Not on file   Number of children: 2   Years of education: Not on file   Highest education level: Not on file  Occupational History   Not on file  Tobacco Use   Smoking status: Former Smoker    Packs/day: 1.00    Years: 2.00    Pack years: 2.00    Types: Cigarettes   Smokeless tobacco: Never Used   Tobacco comment: quite smoker at age 10  Substance and Sexual Activity   Alcohol use: Yes    Alcohol/week: 0.0 - 1.0 standard drinks    Comment: 0-1 per month   Drug use: No   Sexual activity: Not Currently  Other Topics Concern   Not on file  Social History Narrative   Single.   Moved from [place] to [place].   Family lives in [place].   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   Social Determinants of Health   Financial Resource Strain:    Difficulty of Paying Living Expenses: Not on file  Food Insecurity:    Worried About Charity fundraiser in the Last Year: Not on file   [employer] of Food in the Last Year: Not on file  Transportation Needs:    Lack of Transportation (Medical): Not on file   Lack of Transportation (Non-Medical): Not on file  Physical Activity:    Days of Exercise per Week: Not on file   Minutes of Exercise per Session: Not on file   Stress:    Feeling of Stress : Not on file  Social Connections:    Frequency of Communication with Friends and Family: Not on file   Frequency of Social Gatherings with Friends and  Family: Not on file   Attends Religious Services: Not on file   Active Member of Clubs or Organizations: Not on file   Attends Club or Organization Meetings: Not on file   Marital Status: Not on file  Intimate Partner Violence:    Fear of Current or Ex-Partner: Not on file   Emotionally Abused: Not on file   Physically Abused: Not on file   Sexually Abused: Not on file   Current Outpatient Medications on File Prior to Visit  Medication Sig Dispense Refill   albuterol (VENTOLIN HFA) 108 (90 Base) MCG/ACT inhaler INHALE 2 PUFFS BY MOUTH EVERY 6 HOURS AS NEEDED FOR WHEEZE OR SHORTNESS OF BREATH 8 g 0   anastrozole (ARIMIDEX) 1 MG tablet Take 1 tablet (1 mg total) by mouth daily. 90 tablet 3   atorvastatin (LIPITOR) 20 MG tablet TAKE 1 TABLET BY MOUTH EVERY DAY FOR CHOLESTEROL. 90 tablet 3   Continuous Blood Gluc Receiver (DEXCOM G6 RECEIVER) DEVI Use to monitor blood sugar 1 each 0   Continuous Blood Gluc Sensor (DEXCOM G6 SENSOR) MISC Use as instructed change sensor every 10 days E11.65 9 each 3   Continuous Blood Gluc Transmit (DEXCOM G6 TRANSMITTER) MISC 1 Device by Does not apply route every 3 (three) months. 1 each 3   fluticasone (FLONASE) 50 MCG/ACT nasal spray INSTILL 1 SPRAY IN EACH NOSTRIL TWICE A DAY AS NEEDED FOR ALLERGIES OR RHINITIS 48 mL 1   fluticasone-salmeterol (WIXELA INHUB) 250-50 MCG/ACT AEPB INHALE 1 PUFF BY MOUTH TWICE A DAY 60 each 9   gabapentin (NEURONTIN) 100 MG capsule Take 1-3 capsules by mouth once to twice daily for pain. 90 capsule 0   insulin aspart (NOVOLOG) 100 UNIT/ML injection INJECT up to 70 UNITS INTO THE SKIN ONCE FOR 1 DOSE. VIA PUMP 90 mL 3   Insulin Disposable Pump (V-GO 20) 20 UNIT/24HR KIT USE 1X A DAY 90 kit 3   Insulin Pen Needle 32G X 4 MM MISC Use 3x a day 100  each 3   levothyroxine (SYNTHROID) 88 MCG tablet TAKE 1 AND 1/2 TABLET BY MOUTH EVERY SUNDAY. TAKE 1 TABLET BY MOUTH MON THROUGH SAT. TAKE ON AN EMPTY STOMACH WITH WATER ONLY. NO FOOD OR OTHER MEDICATIONS FOR 30 MINUTES. 96 tablet 3   metFORMIN (GLUCOPHAGE-XR) 500 MG 24 hr tablet TAKE 2 TABLETS (1,000 MG TOTAL) BY MOUTH DAILY WITH BREAKFAST. FOR DIABETES. 180 tablet 1   montelukast (SINGULAIR) 10 MG tablet TAKE 1 TABLET (10 MG TOTAL) BY MOUTH AT BEDTIME. FOR ALLERGIES AND ASTHMA. 90 tablet 1   ONETOUCH VERIO test strip USE AS DIRECTED TO CHECK FASTING BLOOD SUGARS THREE TIMES DAILY. INSULIN DEPENDENT DX CODE E11.9 300 strip 1   OZEMPIC, 1 MG/DOSE, 4 MG/3ML SOPN INJECT '1MG'$  INTO THE SKIN ONCE A WEEK 9 mL 4   predniSONE (DELTASONE) 20 MG tablet Take 2 tablets by mouth once daily in the morning for 5 days. (Patient not taking: Reported on 01/23/2022) 10 tablet 0   No current facility-administered medications on file prior to visit.   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 She described it was a small amount in a milk carton in [place]. She states they stopped using it shortly after because the trial wasn't successful. SPM   Prednisone     Make sugar jump way up.   Family History  Problem Relation Age of Onset  Alzheimer's disease Mother    Asthma Mother    Dementia Mother        cause of death   Arthritis Father    Heart attack Father 54       cause of death   Breast cancer Sister 70   Healthy Daughter    Colon cancer Paternal Grandmother    Liver cancer Maternal Uncle    Healthy Daughter    Healthy Brother    Healthy Sister    Colon polyps Neg Hx    Rectal cancer Neg Hx    Stomach cancer Neg Hx    PE: BP 124/76 (BP Location: Right Arm, Patient Position: Sitting, Cuff Size: Normal)   Pulse 71   Ht '5\' 5"'$  (1.651 m)   Wt 206 lb 6.4 oz (93.6 kg)   SpO2 97%   BMI 34.35  kg/m  Wt Readings from Last 3 Encounters:  04/15/22 206 lb 6.4 oz (93.6 kg)  01/23/22 202 lb (91.6 kg)  12/12/21 203 lb 9.6 oz (92.4 kg)   Constitutional: overweight, in NAD Eyes:  EOMI, no exophthalmos ENT: no neck masses, no cervical lymphadenopathy Cardiovascular: RRR, No MRG Respiratory: CTA B  Musculoskeletal: no deformities Skin:no rashes Neurological: no tremor with outstretched hands  ASSESSMENT: 1. DM2, insulin-dependent, uncontrolled, without long-term complications, but with hyperglycemia  2. HL  3. Obesity class 1  PLAN:  1. Patient with longstanding, insulin-dependent diabetes, on basal/bolus insulin regimen and weekly GLP-1 receptor agonist, with very fluctuating blood sugars and an HbA1c of 8.4%, increased, at last visit.  She is on the V-Go patch insulin pump.  Other pumps were too expensive for her.  At last visit, we continued her VGo and the rest of the regimen, but I also advised her to use extra doses of NovoLog along with a VGo boluses before meals -as she was on steroids and sugars were even higher.  We also discussed about trying to have more organized meals. -Of note, we cannot increase the Ozempic dose due to constipation. CGM interpretation: -At today's visit, we reviewed her CGM downloads: It appears that 50% of values are in target range (goal >70%), while 0.9% are higher than 180 (goal <25%), and 1% are lower than 70 (goal <4%).  The calculated average blood sugar is 189.  The projected HbA1c for the next 3 months ([facility]) is 7.8%. -Reviewing the CGM trends, sugars are dropping overnight and stayed mostly within the target range in the second half of the night, afterwards increasing slightly after breakfast and then more significantly after lunch and dinner.  Upon questioning, she is taking a fixed dose of insulin before lunch, 10 units and before dinner, 12 units, despite the higher blood sugars seen after these meals.  We discussed about decreasing these and  supplementing with her NovoLog pen if needed.  Also, she corrects with 5 units of insulin at bedtime, which is likely the reason why the sugars are dropping fairly abruptly after 12 AM.  We discussed about not correcting it so much insulin but, ideally, getting the blood sugars after dinner more controlled so she would not have to correct at all.  We can continue the rest of the regimen for now. -At today's visit, she mentions constipation with Ozempic, now improved on the supplement by Dr. Ardeth Perfect.  We also discussed about how to use MiraLAX correctly.  She will also try this. - I suggested to:  Patient Instructions  Please continue: - Metformin ER 1000 mg with breakfast -  Ozempic 1 mg weekly - [id number] with 5-7 clicks before meals +/- Novolog 2-4 units for correction  For dinner, take 7-8 clicks (93-81 units). If sugars before meals <60, do not take Novolog before that meal. If sugars before meals <90, try to move the bolus right before the meal  Please return in 3 months.   - we checked her HbA1c: 7.7% (lower) - advised to check sugars at different times of the day - 4x a day, rotating check times - advised for yearly eye exams >> she is UTD - return to clinic in 3 months  2. HL -Reviewed latest lipid panel from 10/2021: LDL above target, otherwise fractions at goal: Lab Results  Component Value Date   CHOL 187 10/31/2021   HDL 79.10 10/31/2021   LDLCALC 92 10/31/2021   TRIG 79.0 10/31/2021   CHOLHDL 2 10/31/2021  -She continues on Lipitor 20 mg daily without side effects  3.  Obesity class I -She continues on as an inpatient also help with weight loss -She follows up on a more plant-based diet -She gained 5 pounds before last visit and 3 pounds since then  Philemon Kingdom, MD PhD [facility]

## 2022-04-15 NOTE — Telephone Encounter (Signed)
Patient requested a single pen Novolog be called into the [facility] on [address], [place]

## 2022-04-15 NOTE — Telephone Encounter (Signed)
Rx sent 

## 2022-04-22 ENCOUNTER — Telehealth: Payer: Self-pay | Admitting: Primary Care

## 2022-04-22 NOTE — Telephone Encounter (Signed)
Patient called in and stated that she think she may have Hantavirus. She stated that they are doing construction around her house and they are cutting down trees. She stated that she has had an infestation of mice in her home since than. She has found droplets all around and she has been cleaning them up. She stated that she is experiencing headache, tiredness, and her nostrils are drying out. She was wanting to know is this a test that can be done to see if she has this virus. Please advise. Thank you!

## 2022-04-23 NOTE — Telephone Encounter (Signed)
Spoke with patient and she stated that right now she is feeling fine. She stated that if she was to start experiencing more symptoms she will schedule something then. She just wanted to make sure that it was possible to have that test done here.

## 2022-04-23 NOTE — Telephone Encounter (Signed)
Noted  

## 2022-04-23 NOTE — Telephone Encounter (Signed)
There is a test that can be done. She will need an office visit to discuss and for testing. Thanks!

## 2022-04-24 ENCOUNTER — Inpatient Hospital Stay: Payer: Medicare Other | Attending: Hematology and Oncology | Admitting: Hematology and Oncology

## 2022-04-24 VITALS — BP 139/65 | HR 80 | Temp 97.9°F | Resp 16 | Ht 65.0 in | Wt 206.3 lb

## 2022-04-24 DIAGNOSIS — Z1231 Encounter for screening mammogram for malignant neoplasm of breast: Secondary | ICD-10-CM | POA: Diagnosis not present

## 2022-04-24 DIAGNOSIS — M8588 Other specified disorders of bone density and structure, other site: Secondary | ICD-10-CM

## 2022-04-24 DIAGNOSIS — M8580 Other specified disorders of bone density and structure, unspecified site: Secondary | ICD-10-CM | POA: Insufficient documentation

## 2022-04-24 DIAGNOSIS — Z800 Family history of malignant neoplasm of digestive organs: Secondary | ICD-10-CM | POA: Insufficient documentation

## 2022-04-24 DIAGNOSIS — Z79811 Long term (current) use of aromatase inhibitors: Secondary | ICD-10-CM | POA: Diagnosis not present

## 2022-04-24 DIAGNOSIS — Z803 Family history of malignant neoplasm of breast: Secondary | ICD-10-CM | POA: Insufficient documentation

## 2022-04-24 DIAGNOSIS — N6091 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: Secondary | ICD-10-CM | POA: Diagnosis not present

## 2022-04-24 NOTE — Progress Notes (Signed)
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 STK11, TP53, TSC1, TSC2, and VHL.  The following genes were evaluated for sequence changes only: SDHA and HOXB13 c.251G>A variant only.   (3) breast Wall high risk: intensified screening:             (a) yearly mammography in May             (b) yearly breast MRI in November             (c) biannual MD breast exam  Per: [email or web address]   (4) breast Wall high risk: risk reduction             (a) anastrozole started 01/05/2018  Per: [email or web address]  CURRENT THERAPY: Anastrozole  INTERVAL HISTORY:  Molly Wall 76 y.o. female returns for follow-up and evaluation of her history of increased breast Wall risk due to atypical ductal hyperplasia and atypical hyperplasia that was diagnosed in October 2019.  She continues on anastrozole daily with good tolerance.  Her most recent bone density test was completed on May 08, 2020 and showed mild osteopenia with a T score of  -1.8. She is due for another one. She is due for another bone density  MR breast bilateral with unremarkable evidence of malignancy back in Aug 2023.  She continues with MRIs alternating with mammograms for breast Wall screening. She is excited to be done with the bills soon.  She is not a pill taker.  She lost a sister to metastatic breast Wall.   Rest of the pertinent 10 point ROS reviewed and neg  Patient Active Problem List   Diagnosis Date Noted   RUQ abdominal pain 01/31/2022   Adventitious breath sounds 11/20/2021   Neuropathic pain of both feet 10/31/2021   GERD (gastroesophageal reflux disease) 04/25/2021   Vegan diet 02/21/2020   Word finding difficulty 02/21/2020   Arthralgia of both hands 11/29/2019   Seasonal allergic rhinitis 11/29/2019   Chronic knee pain (intermittent) (Right) 11/01/2018   Chronic pain syndrome 10/31/2018   Flank pain 10/13/2018   Shortness of breath 10/13/2018   History iodine allergy 09/28/2018    Class: History of   DDD (degenerative disc disease), lumbar 09/27/2018   Grade 1 Anterolisthesis of L5/S1 09/16/2018   Lumbar facet arthropathy 09/16/2018   Arthropathy of lumbosacral facet joint 09/16/2018   Sialadenitis 08/24/2018   Genetic testing 11/27/2017   Atypical lobular hyperplasia [facility]) of right breast 11/06/2017   Family history of breast Wall    Family history of colon Wall    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   Constipation 04/13/2017   Chronic low back pain (Secondary area of Pain) (Bilateral) (R>L) 02/13/2016   Chronic lower extremity pain (Primary  Area of Pain) (Right) 02/13/2016   Chronic lumbar radicular pain (Right) 02/13/2016   Type 2 diabetes mellitus with hyperglycemia, with long-term current use of insulin ([place]) 07/18/2015   Vitamin D deficiency 07/18/2015   Hyperlipidemia 12/13/2014   Asthma, chronic 12/13/2014   Hypothyroidism 12/13/2014   Lumbar herniated disc 12/13/2014    is allergic to  iodine-131 and prednisone.  MEDICAL HISTORY: Past Medical History:  Diagnosis Date   Allergic rhinitis    Allergy    Asthma    Atypical ductal hyperplasia of right breast    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   Breast Wall ([place])    Diabetes mellitus without complication ([place])    Family history of breast Wall    Family history of colon Wall    Hypercholesteremia    Hypothyroidism    Lumbar herniated disc    OSA (obstructive sleep apnea)    used to use CPAP, not any more over 1 1/2 yrs   Salivary stone    left side   Sleep apnea    no cpap in 2 yrs    Vitamin D deficiency     SURGICAL HISTORY: Past Surgical History:  Procedure Laterality Date   APPENDECTOMY     BREAST LUMPECTOMY WITH RADIOACTIVE SEED LOCALIZATION Right 08/12/2017   Procedure: RIGHT BREAST LUMPECTOMY X'S 2 WITH RADIOACTIVE SEED LOCALIZATION X'S 2;  Surgeon: Fanny Skates, MD;  Location: [place];  Service: General;  Laterality: Right;   COLONOSCOPY     11 yrs ago in [place] - polyps but given a 10 yr recall per pt    GANGLION CYST EXCISION     77 years old   [place]  2014   [place]     age 60    SOCIAL HISTORY: Social History   Socioeconomic History   Marital status: Divorced    Spouse name: Not on file   Number of children: 2   Years of education: Not on file   Highest education level: Not on file  Occupational History   Not on file  Tobacco Use   Smoking status: Never    Passive exposure: Never   Smokeless tobacco: Never  Vaping Use   Vaping Use: Never used  Substance and Sexual Activity   Alcohol use: Yes    Alcohol/week: 0.0 - 1.0 standard drinks of alcohol    Comment: rarely   Drug use: No   Sexual activity: Not Currently  Other Topics Concern   Not on file  Social History Narrative   Single.   Moved from [place] to [place] several weeks ago.   Family lives in  [place].   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   Social Determinants of Health   Financial Resource Strain: Low Risk  (10/18/2021)   Overall Financial Resource Strain (CARDIA)    Difficulty of Paying Living Expenses: Not hard at all  Food Insecurity: No Food Insecurity (10/18/2021)   Hunger Vital Sign    Worried About Running Out of Food in the Last Year: Never true    Ran Out of Food in the Last Year: Never true  Transportation Needs: No Transportation Needs (10/18/2021)   PRAPARE - Hydrologist (Medical): No    Lack of Transportation (Non-Medical): No  Physical Activity: Inactive (  10/18/2021)   Exercise Vital Sign    Days of Exercise per Week: 0 days    Minutes of Exercise per Session: 0 min  Stress: No Stress Concern Present (10/18/2021)   [place]    Feeling of Stress : Not at all  Social Connections: Not on file  Intimate Partner Violence: Not on file    FAMILY HISTORY: Family History  Problem Relation Age of Onset   Alzheimer's disease Mother    Asthma Mother    Dementia Mother        cause of death   Arthritis Father    Heart attack Father 69       cause of death   Breast Wall Sister 100   Healthy Daughter    Colon Wall Paternal Grandmother    Liver Wall Maternal Uncle    Healthy Daughter    Healthy Brother    Healthy Sister    Colon polyps Neg Hx    Rectal Wall Neg Hx    Stomach Wall Neg Hx     Review of Systems  Constitutional:  Negative for appetite change, chills, fatigue, fever and unexpected weight change.  HENT:   Negative for hearing loss, lump/mass and trouble swallowing.   Eyes:  Negative for eye problems and icterus.  Respiratory:  Negative for chest tightness, cough and shortness of breath.   Cardiovascular:  Negative for chest pain, leg swelling and  palpitations.  Gastrointestinal:  Negative for abdominal distention, abdominal pain, constipation, diarrhea, nausea and vomiting.  Endocrine: Negative for hot flashes.  Genitourinary:  Negative for difficulty urinating.   Musculoskeletal:  Negative for arthralgias.  Skin:  Negative for itching and rash.  Neurological:  Negative for dizziness, extremity weakness, headaches and numbness.  Hematological:  Negative for adenopathy. Does not bruise/bleed easily.  Psychiatric/Behavioral:  Negative for depression. The patient is not nervous/anxious.       PHYSICAL EXAMINATION  ECOG PERFORMANCE STATUS: 0 - Asymptomatic  Vitals:   04/24/22 1332  BP: 139/65  Pulse: 80  Resp: 16  Temp: 97.9 F (36.6 C)  SpO2: 96%    Physical Exam Constitutional:      General: She is not in acute distress.    Appearance: Normal appearance. She is not toxic-appearing.  HENT:     Head: Normocephalic and atraumatic.  Eyes:     General: No scleral icterus. Cardiovascular:     Rate and Rhythm: Normal rate and regular rhythm.     Pulses: Normal pulses.     Heart sounds: Normal heart sounds.  Pulmonary:     Effort: Pulmonary effort is normal.     Breath sounds: Normal breath sounds.  Chest:     Comments: Right breast s/p lumpectomy, both breasts are benign Abdominal:     General: Abdomen is flat. Bowel sounds are normal. There is no distension.     Palpations: Abdomen is soft.     Tenderness: There is no abdominal tenderness.  Musculoskeletal:        General: No swelling.     Cervical back: Neck supple.  Lymphadenopathy:     Cervical: No cervical adenopathy.  Skin:    General: Skin is warm and dry.     Findings: No rash.  Neurological:     General: No focal deficit present.     Mental Status: She is alert.  Psychiatric:        Mood and Affect: Mood normal.  Behavior: Behavior normal.     LABORATORY DATA:  CBC    Component Value Date/Time   WBC 5.0 01/23/2022 1008   RBC 5.01  01/23/2022 1008   HGB 13.9 01/23/2022 1008   HGB 14.1 10/15/2021 1310   HCT 43.2 01/23/2022 1008   PLT 222.0 01/23/2022 1008   PLT 236 10/15/2021 1310   MCV 86.2 01/23/2022 1008   MCH 28.5 10/15/2021 1310   MCHC 32.1 01/23/2022 1008   RDW 13.7 01/23/2022 1008   LYMPHSABS 1.4 10/15/2021 1310   MONOABS 0.4 10/15/2021 1310   EOSABS 0.3 10/15/2021 1310   BASOSABS 0.1 10/15/2021 1310    CMP     Component Value Date/Time   NA 133 (L) 01/23/2022 1008   K 4.7 01/23/2022 1008   CL 96 01/23/2022 1008   CO2 31 01/23/2022 1008   GLUCOSE 289 (H) 01/23/2022 1008   BUN 12 01/23/2022 1008   CREATININE 0.71 01/23/2022 1008   CREATININE 0.78 10/15/2021 1310   CALCIUM 9.3 01/23/2022 1008   PROT 6.8 01/23/2022 1008   ALBUMIN 4.2 01/23/2022 1008   AST 14 01/23/2022 1008   AST 15 10/15/2021 1310   ALT 11 01/23/2022 1008   ALT 11 10/15/2021 1310   ALKPHOS 95 01/23/2022 1008   BILITOT 0.5 01/23/2022 1008   BILITOT 0.5 10/15/2021 1310   GFRNONAA >60 10/15/2021 1310   GFRAA >60 01/06/2019 1416      ASSESSMENT and THERAPY PLAN:    Kelse is a 77 year old woman here today for f/u of her increased breast Wall risk.  She has no signs of Wall during today's visit and continues on Anastrozole with good tolerance.  I recommended she continue with anastrozole, and intensified breast Wall screening with breast MRI and mammogram. I think it is reasonable to continue MRIs alternating with mammograms for breast Wall screening. No concerning findings on physical examination today.  She is due for mammogram and bone density, these have been ordered.  I have encouraged her to continue anastrozole until October 2024 when she will finish 5 years.  She can discontinue after this.  She will return to clinic in 6 months to see Mendel Ryder in 1 year to follow-up with me.   Total encounter time:30 minutes*in face-to-face visit time, chart review, lab review, care coordination, order entry, and documentation of  the encounter time.   *Total Encounter Time as defined by the Centers for Medicare and Medicaid Services includes, in addition to the face-to-face time of a patient visit (documented in the note above) non-face-to-face time: obtaining and reviewing outside history, ordering and reviewing medications, tests or procedures, care coordination (communications with other health care professionals or caregivers) and documentation in the medical record.

## 2022-05-02 ENCOUNTER — Other Ambulatory Visit: Payer: Self-pay | Admitting: Internal Medicine

## 2022-05-21 ENCOUNTER — Other Ambulatory Visit (HOSPITAL_COMMUNITY): Payer: Self-pay

## 2022-05-29 ENCOUNTER — Other Ambulatory Visit (HOSPITAL_COMMUNITY): Payer: Self-pay

## 2022-05-30 ENCOUNTER — Telehealth: Payer: Self-pay | Admitting: Pharmacy Technician

## 2022-05-30 ENCOUNTER — Other Ambulatory Visit (HOSPITAL_COMMUNITY): Payer: Self-pay

## 2022-05-30 NOTE — Telephone Encounter (Signed)
-----   Message from Lauralyn Primes, [place] sent at 05/20/2022  4:48 PM EST ----- Regarding: PA Can we get an expedited PA for Ozempic pt rx is at pharmacy waiting for approval to pick up. Thank you.

## 2022-05-30 NOTE — Telephone Encounter (Signed)
Pharmacy Patient Advocate Encounter   Received notification from staff msgs/rma that prior authorization for Ozempic 1mg  is required/requested.   PA submitted on 05/30/22 to (ins) [employer] via CoverMyMeds via fax Key or (Medicaid) confirmation # [id number] Status is pending

## 2022-05-31 ENCOUNTER — Other Ambulatory Visit: Payer: Self-pay | Admitting: Primary Care

## 2022-05-31 DIAGNOSIS — J302 Other seasonal allergic rhinitis: Secondary | ICD-10-CM

## 2022-06-02 NOTE — Telephone Encounter (Signed)
Patient Advocate Encounter  Prior Authorization for Ozempic (1 MG/DOSE) 4MG /3ML pen-injectors has been approved through [employer].    Key: [id number]  Effective: 05-30-2022 to 05-30-2023

## 2022-06-13 ENCOUNTER — Other Ambulatory Visit: Payer: Medicare Other

## 2022-06-13 ENCOUNTER — Inpatient Hospital Stay: Admission: RE | Admit: 2022-06-13 | Payer: Medicare Other | Source: Ambulatory Visit

## 2022-06-17 ENCOUNTER — Ambulatory Visit
Admission: RE | Admit: 2022-06-17 | Discharge: 2022-06-17 | Disposition: A | Discharge status: Home / Self Care | Payer: Medicare Other | Source: Ambulatory Visit | Attending: Primary Care | Admitting: Primary Care

## 2022-06-17 DIAGNOSIS — N6091 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: Secondary | ICD-10-CM

## 2022-06-17 DIAGNOSIS — Z1231 Encounter for screening mammogram for malignant neoplasm of breast: Secondary | ICD-10-CM

## 2022-06-26 ENCOUNTER — Telehealth: Payer: Self-pay

## 2022-06-26 DIAGNOSIS — E1165 Type 2 diabetes mellitus with hyperglycemia: Secondary | ICD-10-CM

## 2022-06-26 MED ORDER — INSULIN ASPART 100 UNIT/ML IJ SOLN: INTRAMUSCULAR | 3 refills | Status: DC

## 2022-06-26 NOTE — Telephone Encounter (Signed)
Pt called to advise Tuesday her blood sugar rose to over 600. The Dexcom stopped reading at 400 and the glucose reader stopped reading over 600. Pt was thinking she may have had a bad batch of insulin. She does not think it got too hot or anything but she is wondering if she could of had bad batch of insulin like from the pharmacy. Pt is weak and dizzy. Wanting to know what she can do to get her sugars down. She is checking her sugar and administering correction doses about every hour.

## 2022-06-27 NOTE — Telephone Encounter (Signed)
It is difficult to know without an evaluation. Please see pcp asap for this but if she cannot get in, she may need to go to urgent care or ED. Ty! C

## 2022-07-01 NOTE — Telephone Encounter (Signed)
Lvm for pt following up on blood sugars and how pt is currently feeling. Requested a call back.

## 2022-07-02 ENCOUNTER — Other Ambulatory Visit: Payer: Self-pay | Admitting: Adult Health

## 2022-07-02 ENCOUNTER — Other Ambulatory Visit: Payer: Self-pay | Admitting: Internal Medicine

## 2022-07-02 DIAGNOSIS — E119 Type 2 diabetes mellitus without complications: Secondary | ICD-10-CM

## 2022-07-02 NOTE — Telephone Encounter (Signed)
She will complete antiestrogen therapy in 12/2022, please do not send further refill requests after this date.

## 2022-07-04 ENCOUNTER — Other Ambulatory Visit: Payer: Self-pay | Admitting: Internal Medicine

## 2022-07-04 DIAGNOSIS — E1165 Type 2 diabetes mellitus with hyperglycemia: Secondary | ICD-10-CM

## 2022-07-14 LAB — HM DIABETES EYE EXAM

## 2022-07-15 ENCOUNTER — Encounter: Payer: Self-pay | Admitting: Internal Medicine

## 2022-07-17 ENCOUNTER — Telehealth: Payer: Self-pay

## 2022-07-17 NOTE — Telephone Encounter (Signed)
Request for recent clinical note for Medicare Part B compliance audit. Clinical notes routed via Epic.

## 2022-07-18 ENCOUNTER — Encounter: Payer: Self-pay | Admitting: Gastroenterology

## 2022-07-18 ENCOUNTER — Other Ambulatory Visit: Payer: Self-pay | Admitting: Primary Care

## 2022-07-18 DIAGNOSIS — J0110 Acute frontal sinusitis, unspecified: Secondary | ICD-10-CM

## 2022-07-21 ENCOUNTER — Encounter: Payer: Self-pay | Admitting: Family Medicine

## 2022-07-21 ENCOUNTER — Ambulatory Visit (INDEPENDENT_AMBULATORY_CARE_PROVIDER_SITE_OTHER): Payer: Medicare Other | Admitting: Family Medicine

## 2022-07-21 VITALS — BP 118/62 | HR 81 | Temp 97.2°F | Ht 65.0 in | Wt 216.0 lb

## 2022-07-21 DIAGNOSIS — J22 Unspecified acute lower respiratory infection: Secondary | ICD-10-CM | POA: Diagnosis not present

## 2022-07-21 DIAGNOSIS — E1165 Type 2 diabetes mellitus with hyperglycemia: Secondary | ICD-10-CM

## 2022-07-21 DIAGNOSIS — J45909 Unspecified asthma, uncomplicated: Secondary | ICD-10-CM

## 2022-07-21 DIAGNOSIS — R0600 Dyspnea, unspecified: Secondary | ICD-10-CM

## 2022-07-21 DIAGNOSIS — Z794 Long term (current) use of insulin: Secondary | ICD-10-CM

## 2022-07-21 LAB — POC COVID19 BINAXNOW: SARS Coronavirus 2 Ag: NEGATIVE

## 2022-07-21 MED ORDER — CHERATUSSIN AC 100-10 MG/5ML PO SOLN: 5.0000 mL | Freq: Two times a day (BID) | ORAL | 0 refills | Status: DC | PRN

## 2022-07-21 MED ORDER — ALBUTEROL SULFATE HFA 108 (90 BASE) MCG/ACT IN AERS: INHALATION_SPRAY | RESPIRATORY_TRACT | 1 refills | Status: DC

## 2022-07-21 NOTE — Assessment & Plan Note (Addendum)
Chronic, anticipate mildly exacerbated with current respiratory infection. Declines oral prednisone due to hyperglycemia. Albuterol rescue inhaler refilled, discussed use.  She's only using wixela once daily - advised increase to BID at least while ill.

## 2022-07-21 NOTE — Patient Instructions (Addendum)
You have a viral respiratory infection. Antibiotics are not needed for this. Viral infections usually take 7-10 days to resolve.  The cough can last a few weeks to go away.  Use medication as prescribed: cheratussin  May take plain mucinex or fast relief guaifenesin with glass of water to help mobilize mucous Push fluids and plenty of rest.  Please return if you are not improving as expected, or if you have high fevers (>101.5) or difficulty swallowing or worsening productive cough instead of improvement.  Call clinic with questions.  Good to see you today. I hope you start feeling better soon.

## 2022-07-21 NOTE — Assessment & Plan Note (Addendum)
Anticipate viral bronchitis.  Supportive measures reviewed as per instructions - Rx sugar free cheratussin, discussed guaifenesin use.  Reviewed anticipated course of improvement.  Update if not improving, low threshold to consider abx given comorbidities.  COVID negative today - consider retesting if develops fever or worsening symptoms.

## 2022-07-21 NOTE — Progress Notes (Signed)
Ph: [phone]       Fax: [phone]   Patient ID: Molly Wall, female    DOB: Oct 19, 1945, 77 y.o.   MRN: [id number]  This visit was conducted in person.  BP 118/62   Pulse 81   Temp (!) 97.2 F (36.2 C) (Temporal)   Ht 5\' 5"  (1.651 m)   Wt 216 lb (98 kg)   SpO2 95%   BMI 35.94 kg/m    CC: lower respiratory infection symptoms  Subjective:   HPI: Molly Wall is a 77 y.o. female presenting on 07/21/2022 for Chest Pain (C/o sorness and burning sensation in chest, SOB, fatigue, sinus congestion/drainage- yellow. Sxs started 07/19/52.)   1d h/o chest tightness and soreness, burning pain, shortness of breath, fatigue, with sinus congestion/drainage, ear congestion. Predominant symptom has been increased mucous production. Currently head > chest congestion. + PNDrainage.  No fevers/chills, wheezing, HA, ST.   Albuterol rescue inhaler did help last night.  Chest symptoms and dyspnea have resolved overnight.  Avoids steroids due to hyperglycemia.   Daughter to have double mastectomy next Tuesday 07/29/2022 - she is going to be her caregiver. Wants to ensure she feels well prior to this.   Known h/o asthma, allergic rhinitis on albuterol PRN as well as Wixela, flonase and singulair daily. No recent trouble with asthma.  Known insulin dependent diabetic followed by endocrinologist Dr Elvera Lennox on novolog insulin via pump, as well as metformin and ozempic. Upcoming appt tomorrow  Lab Results  Component Value Date   HGBA1C 7.7 (A) 04/15/2022   No flu shots, no COVID shots.      Relevant past medical, surgical, family and social history reviewed and updated as indicated. Interim medical history since our last visit reviewed. Allergies and medications reviewed and updated. Outpatient Medications Prior to Visit  Medication Sig Dispense Refill   anastrozole (ARIMIDEX) 1 MG tablet TAKE 1 TABLET BY MOUTH EVERY DAY 90 tablet 1   atorvastatin (LIPITOR) 20 MG  tablet TAKE 1 TABLET BY MOUTH EVERY DAY FOR CHOLESTEROL. 90 tablet 3   BD PEN NEEDLE NANO 2ND GEN 32G X 4 MM MISC USE 3 TIMES A DAY 100 each 3   Continuous Blood Gluc Receiver (DEXCOM G6 RECEIVER) DEVI Use to monitor blood sugar 1 each 0   Continuous Blood Gluc Transmit (DEXCOM G6 TRANSMITTER) MISC 1 Device by Does not apply route every 3 (three) months. 1 each 3   Continuous Glucose Sensor (DEXCOM G6 SENSOR) MISC USE AS INSTRUCTED CHANGE SENSOR EVERY 10 DAYS E11.65 9 each 3   fluticasone (FLONASE) 50 MCG/ACT nasal spray INSTILL 1 SPRAY IN EACH NOSTRIL TWICE A DAY AS NEEDED FOR ALLERGIES OR RHINITIS 48 mL 0   fluticasone-salmeterol (WIXELA INHUB) 250-50 MCG/ACT AEPB INHALE 1 PUFF BY MOUTH TWICE A DAY 60 each 9   insulin aspart (NOVOLOG FLEXPEN) 100 UNIT/ML FlexPen Inject 25 Units into the skin 3 (three) times daily with meals. 3 mL PRN   insulin aspart (NOVOLOG) 100 UNIT/ML injection INJECT up to 70 UNITS INTO THE SKIN ONCE FOR 1 DOSE. VIA PUMP 90 mL 3   Insulin Disposable Pump (V-GO 20) 20 UNIT/24HR KIT USE 1X A DAY 90 kit 3   levothyroxine (SYNTHROID) 88 MCG tablet TAKE 1 AND 1/2 TABLET BY MOUTH EVERY SUNDAY. TAKE 1 TABLET BY MOUTH MON THROUGH SAT. TAKE ON AN EMPTY STOMACH WITH WATER ONLY. NO FOOD OR OTHER MEDICATIONS FOR 30 MINUTES. 96 tablet 3   metFORMIN (GLUCOPHAGE-XR) 500  MG 24 hr tablet TAKE 2 TABLETS (1,000 MG TOTAL) BY MOUTH DAILY WITH BREAKFAST. FOR DIABETES. 180 tablet 1   montelukast (SINGULAIR) 10 MG tablet TAKE 1 TABLET (10 MG TOTAL) BY MOUTH AT BEDTIME. FOR ALLERGIES AND ASTHMA. 90 tablet 1   ONETOUCH VERIO test strip USE AS DIRECTED TO CHECK FASTING BLOOD SUGARS THREE TIMES DAILY. INSULIN DEPENDENT DX CODE E11.9 300 strip 1   albuterol (VENTOLIN HFA) 108 (90 Base) MCG/ACT inhaler INHALE 2 PUFFS BY MOUTH EVERY 6 HOURS AS NEEDED FOR WHEEZE OR SHORTNESS OF BREATH 8 g 0   gabapentin (NEURONTIN) 100 MG capsule Take 1-3 capsules by mouth once to twice daily for pain. (Patient not taking:  Reported on 07/21/2022) 90 capsule 0   OZEMPIC, 1 MG/DOSE, 4 MG/3ML SOPN INJECT 1MG  INTO THE SKIN ONCE A WEEK (Patient not taking: Reported on 07/21/2022) 9 mL 4   predniSONE (DELTASONE) 20 MG tablet Take 2 tablets by mouth once daily in the morning for 5 days. 10 tablet 0   No facility-administered medications prior to visit.     Per HPI unless specifically indicated in ROS section below Review of Systems  Objective:  BP 118/62   Pulse 81   Temp (!) 97.2 F (36.2 C) (Temporal)   Ht 5\' 5"  (1.651 m)   Wt 216 lb (98 kg)   SpO2 95%   BMI 35.94 kg/m   Wt Readings from Last 3 Encounters:  07/21/22 216 lb (98 kg)  04/24/22 206 lb 4.8 oz (93.6 kg)  04/15/22 206 lb 6.4 oz (93.6 kg)      Physical Exam Vitals and nursing note reviewed.  Constitutional:      Appearance: Normal appearance. She is not ill-appearing.  HENT:     Head: Normocephalic and atraumatic.     Right Ear: Tympanic membrane, ear canal and external ear normal. There is no impacted cerumen.     Left Ear: Tympanic membrane, ear canal and external ear normal. There is no impacted cerumen.     Nose: Congestion and rhinorrhea present.     Mouth/Throat:     Mouth: Mucous membranes are moist.     Pharynx: Oropharynx is clear. Posterior oropharyngeal erythema (posterior oropharynx) present. No oropharyngeal exudate.  Eyes:     Extraocular Movements: Extraocular movements intact.     Conjunctiva/sclera: Conjunctivae normal.     Pupils: Pupils are equal, round, and reactive to light.  Cardiovascular:     Rate and Rhythm: Normal rate and regular rhythm.     Pulses: Normal pulses.     Heart sounds: Normal heart sounds. No murmur heard. Pulmonary:     Effort: Pulmonary effort is normal. No respiratory distress.     Breath sounds: No wheezing, rhonchi or rales.     Comments: Bibasilar crackles, largely clear with deep cough Musculoskeletal:     Right lower leg: No edema.     Left lower leg: No edema.  Lymphadenopathy:      Head:     Right side of head: No submental, submandibular, tonsillar, preauricular or posterior auricular adenopathy.     Left side of head: No submental, submandibular, tonsillar, preauricular or posterior auricular adenopathy.     Cervical: No cervical adenopathy.     Right cervical: No superficial cervical adenopathy.    Left cervical: No superficial cervical adenopathy.     Upper Body:     Right upper body: No supraclavicular adenopathy.     Left upper body: No supraclavicular adenopathy.  Skin:  Findings: No rash.  Neurological:     Mental Status: She is alert.  Psychiatric:        Mood and Affect: Mood normal.        Behavior: Behavior normal.       Results for orders placed or performed in visit on 07/21/22  POC COVID-19 BinaxNow  Result Value Ref Range   SARS Coronavirus 2 Ag Negative Negative    Assessment & Plan:   Problem List Items Addressed This Visit     Asthma    Chronic, anticipate mildly exacerbated with current respiratory infection. Declines oral prednisone due to hyperglycemia. Albuterol rescue inhaler refilled, discussed use.  She's only using wixela once daily - advised increase to BID at least while ill.      Relevant Medications   albuterol (VENTOLIN HFA) 108 (90 Base) MCG/ACT inhaler   Type 2 diabetes mellitus with hyperglycemia, with long-term current use of insulin (HCC)   Acute respiratory infection - Primary    Anticipate viral bronchitis.  Supportive measures reviewed as per instructions - Rx sugar free cheratussin, discussed guaifenesin use.  Reviewed anticipated course of improvement.  Update if not improving, low threshold to consider abx given comorbidities.  COVID negative today - consider retesting if develops fever or worsening symptoms.       Other Visit Diagnoses     Dyspnea, unspecified type       Relevant Orders   POC COVID-19 BinaxNow (Completed)        Meds ordered this encounter  Medications   guaiFENesin-codeine  (CHERATUSSIN AC) 100-10 MG/5ML syrup    Sig: Take 5 mLs by mouth 2 (two) times daily as needed for cough (sedation precautions).    Dispense:  120 mL    Refill:  0    Sugar free cough syrup if able   albuterol (VENTOLIN HFA) 108 (90 Base) MCG/ACT inhaler    Sig: INHALE 2 PUFFS BY MOUTH EVERY 6 HOURS AS NEEDED FOR WHEEZE OR SHORTNESS OF BREATH    Dispense:  16 g    Refill:  1    2 8g inhalers    Orders Placed This Encounter  Procedures   POC COVID-19 BinaxNow    Order Specific Question:   Previously tested for COVID-19    Answer:   Yes    Order Specific Question:   Resident in a congregate (group) care setting    Answer:   No    Order Specific Question:   Employed in healthcare setting    Answer:   No    Order Specific Question:   Pregnant    Answer:   No    Patient Instructions  You have a viral respiratory infection. Antibiotics are not needed for this. Viral infections usually take 7-10 days to resolve.  The cough can last a few weeks to go away.  Use medication as prescribed: cheratussin  May take plain mucinex or fast relief guaifenesin with glass of water to help mobilize mucous Push fluids and plenty of rest.  Please return if you are not improving as expected, or if you have high fevers (>101.5) or difficulty swallowing or worsening productive cough instead of improvement.  Call clinic with questions.  Good to see you today. I hope you start feeling better soon.   Follow up plan: No follow-ups on file.  Eustaquio Boyden, MD

## 2022-07-22 ENCOUNTER — Telehealth (INDEPENDENT_AMBULATORY_CARE_PROVIDER_SITE_OTHER): Payer: Medicare Other | Admitting: Internal Medicine

## 2022-07-22 ENCOUNTER — Telehealth: Payer: Self-pay

## 2022-07-22 ENCOUNTER — Encounter: Payer: Self-pay | Admitting: Internal Medicine

## 2022-07-22 DIAGNOSIS — E119 Type 2 diabetes mellitus without complications: Secondary | ICD-10-CM | POA: Diagnosis not present

## 2022-07-22 DIAGNOSIS — E1165 Type 2 diabetes mellitus with hyperglycemia: Secondary | ICD-10-CM | POA: Diagnosis not present

## 2022-07-22 DIAGNOSIS — E669 Obesity, unspecified: Secondary | ICD-10-CM

## 2022-07-22 DIAGNOSIS — E785 Hyperlipidemia, unspecified: Secondary | ICD-10-CM | POA: Diagnosis not present

## 2022-07-22 DIAGNOSIS — Z7985 Long-term (current) use of injectable non-insulin antidiabetic drugs: Secondary | ICD-10-CM

## 2022-07-22 DIAGNOSIS — E66811 Obesity, class 1: Secondary | ICD-10-CM

## 2022-07-22 DIAGNOSIS — Z794 Long term (current) use of insulin: Secondary | ICD-10-CM | POA: Diagnosis not present

## 2022-07-22 DIAGNOSIS — Z7984 Long term (current) use of oral hypoglycemic drugs: Secondary | ICD-10-CM

## 2022-07-22 MED ORDER — OZEMPIC (1 MG/DOSE) 4 MG/3ML ~~LOC~~ SOPN
PEN_INJECTOR | SUBCUTANEOUS | 4 refills | Status: DC
Start: 2022-07-22 — End: 2023-08-14

## 2022-07-22 NOTE — Patient Instructions (Signed)
Please continue: - Metformin ER 1000 mg with breakfast - VGo20 with 6-7 clicks before b'fast and lunch; for dinner, take 7-8 clicks If sugars before meals <60, do not take Novolog before that meal. If sugars before meals <90, try to move the bolus right before the meal  Try to restart: - Ozempic 1 mg weekly (start with 36 clicks)  Please return in 3-4 months.

## 2022-07-22 NOTE — Progress Notes (Signed)
Patient ID: Molly Wall, female   DOB: 09-11-1945, 77 y.o.   MRN: [id number]   Virtual Visit via Video Note  Patient location: Home My location: Office  Participants: patient, provider  I connected with Yuvette Postal [name] on 07/22/22 at  1:42 PM EDT by a video enabled telemedicine application and verified that I am speaking with the correct person using two identifiers.   I discussed the limitations of evaluation and management by telemedicine and the availability of in person appointments. The patient expressed understanding and agreed to proceed.  HPI: Adaobi Biagioni [name] is a 77 y.o.-year-old female, initially referred by her PCP, Vernona Rieger, MD, returning for follow-up for DM2, dx at 21 (2017), h/o reactive hypoglycemia, insulin-dependent, uncontrolled, without long-term complications.  Last visit 3.5 months ago.  Interim history: She denies increased urination, blurry vision, nausea, chest pain. She has constipation. Tried metamucin, dulcolax, enemas, now using a supplement by Dr. Ethelene Browns >> helping.  She gained weight after running out of Ozempic. She recently had very high blood sugars, up to 600, after getting an upper respiratory infection. She also had increased with her daughter being diagnosed with breast cancer.  Reviewed HbA1c levels: Lab Results  Component Value Date   HGBA1C 7.7 (A) 04/15/2022   HGBA1C 8.4 (A) 12/09/2021   HGBA1C 7.4 (A) 08/08/2021   HGBA1C 7.2 (A) 04/04/2021   HGBA1C 7.1 (A) 01/01/2021   HGBA1C 6.8 (A) 09/20/2020   HGBA1C 8.1 (A) 06/21/2020   HGBA1C 8.7 (A) 03/19/2020   HGBA1C 8.2 (A) 09/07/2019   HGBA1C 9.4 (H) 06/06/2019   She is on: - Metformin ER 1000 mg with breakfast - Ozempic 0.5 >> 1 mg weekly >> off now for 2-3 mo - VGo 30 >> ... >>- VGo20 with 5-7 clicks before meals +/- Novolog 2-4 units for correction (when on steroids) For dinner, take 7-8 clicks (14-16 units). If sugars before meals <60, do not  take Novolog before that meal. If sugars before meals <90, try to move the bolus right before the meal  Previously on: - Lantus 35 >> 25 units in am >> 25 units in a.m. and 10 units at bedtime >> 20 units in a.m. and 10 units at night - Novolog 7-9 units before 15 min before meals >> B'fast: 6-8 units  Lunch: 10-11 units Dinner: 6-7 units  She checks her sugars more than 4 times a day with her CGM:   Previously:  Previously:  Lowest sugar was 80s >> 40s >> 40s; she has hypoglycemia awareness in the 80s. Highest sugar was 565 >> 500s >> >600.  Glucometer: One Touch Verio  -No CKD, last BUN/creatinine:  Lab Results  Component Value Date   BUN 12 01/23/2022   BUN 14 10/15/2021   CREATININE 0.71 01/23/2022   CREATININE 0.78 10/15/2021  Not on ACE inhibitor/ARB.  -No HL; last set of lipids: Lab Results  Component Value Date   CHOL 187 10/31/2021   HDL 79.10 10/31/2021   LDLCALC 92 10/31/2021   TRIG 79.0 10/31/2021   CHOLHDL 2 10/31/2021  On Lipitor 20.  - last eye exam was in 10/2021: No DR reportedly, + cataracts.   -No numbness but has tingling in her feet. Last foot exam: 12/09/2021.  Pt has FH of DM in [place].  She has OSA and started a CPAP after which she felt much better. 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   She is on levothyroxine 88 mcg 1 tablet 6/7 days and 1.5 tablets 1/7 days.   Latest TSH was normal: Lab Results  Component Value Date   TSH 2.86 10/31/2021   She lives alone.  ROS: + See HPI  I reviewed pt's medications, allergies, PMH, social hx, family hx, and changes were documented in the history of present illness. Otherwise, unchanged from my initial visit note.  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   Breast cancer (HCC)    Diabetes mellitus without complication (HCC)    Family history of breast cancer    Family history of colon cancer    Hypercholesteremia    Hypothyroidism    Lumbar herniated disc    OSA (obstructive sleep apnea)    used to use CPAP, not any more over 1 1/2 yrs   Salivary stone    left side   Sleep apnea    no cpap in 2 yrs    Vitamin D deficiency    Past Surgical History:  Procedure Laterality Date   APPENDECTOMY     BREAST LUMPECTOMY Right    per pt - precancerous   BREAST LUMPECTOMY WITH RADIOACTIVE SEED LOCALIZATION Right 08/12/2017   Procedure: RIGHT BREAST LUMPECTOMY X'S 2 WITH RADIOACTIVE SEED LOCALIZATION X'S 2;  Surgeon: Claud Kelp, MD;  Location: [facility];  Service: General;  Laterality: Right;   COLONOSCOPY     11 yrs ago in [place] - polyps but given a 10 yr recall per pt    GANGLION CYST EXCISION     77 years old   INCISION AND DRAINAGE ABSCESS ANAL     KNEE SURGERY  2014   SHOULDER SURGERY  1976   TONSILLECTOMY     age 21   Social History   Socioeconomic History   Marital status: Divorced    Spouse name: Not on file   Number of children: 2   Years of education: Not on file   Highest education level: Not on file  Occupational History   Not on file  Tobacco Use   Smoking status: Former Smoker    Packs/day: 1.00    Years: 2.00    Pack years: 2.00    Types: Cigarettes   Smokeless tobacco: Never Used   Tobacco comment: quite smoker at age 77  Substance and Sexual Activity   Alcohol use: Yes    Alcohol/week: 0.0 - 1.0 standard drinks    Comment: 0-1 per month   Drug use: No   Sexual activity: Not Currently  Other Topics Concern   Not on file  Social History Narrative   Single.   Moved from [place] to [place].   Family lives in [place].   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    Social Determinants of Health   Financial Resource Strain:    Difficulty of Paying Living Expenses: Not on file  Food Insecurity:    Worried About Programme researcher, broadcasting/film/video in the Last Year: Not on file   [employer] of Food in the Last Year: Not on file  Transportation Needs:    Lack of Transportation (Medical): Not on file  Lack of Transportation (Non-Medical): Not on file  Physical Activity:    Days of Exercise per Week: Not on file   Minutes of Exercise per Session: Not on file  Stress:    Feeling of Stress : Not on file  Social Connections:    Frequency of Communication with Friends and Family: Not on file   Frequency of Social Gatherings with Friends and Family: Not on file   Attends Religious Services: Not on file   Active Member of Clubs or Organizations: Not on file   Attends Banker Meetings: Not on file   Marital Status: Not on file  Intimate Partner Violence:    Fear of Current or Ex-Partner: Not on file   Emotionally Abused: Not on file   Physically Abused: Not on file   Sexually Abused: Not on file   Current Outpatient Medications on File Prior to Visit  Medication Sig Dispense Refill   albuterol (VENTOLIN HFA) 108 (90 Base) MCG/ACT inhaler INHALE 2 PUFFS BY MOUTH EVERY 6 HOURS AS NEEDED FOR WHEEZE OR SHORTNESS OF BREATH 16 g 1   anastrozole (ARIMIDEX) 1 MG tablet TAKE 1 TABLET BY MOUTH EVERY DAY 90 tablet 1   atorvastatin (LIPITOR) 20 MG tablet TAKE 1 TABLET BY MOUTH EVERY DAY FOR CHOLESTEROL. 90 tablet 3   BD PEN NEEDLE NANO 2ND GEN 32G X 4 MM MISC USE 3 TIMES A DAY 100 each 3   Continuous Blood Gluc Receiver (DEXCOM G6 RECEIVER) DEVI Use to monitor blood sugar 1 each 0   Continuous Blood Gluc Transmit (DEXCOM G6 TRANSMITTER) MISC 1 Device by Does not apply route every 3 (three) months. 1 each 3   Continuous Glucose Sensor (DEXCOM G6 SENSOR) MISC USE AS INSTRUCTED CHANGE SENSOR EVERY 10 DAYS E11.65 9 each 3   fluticasone (FLONASE) 50 MCG/ACT nasal spray  INSTILL 1 SPRAY IN EACH NOSTRIL TWICE A DAY AS NEEDED FOR ALLERGIES OR RHINITIS 48 mL 0   fluticasone-salmeterol (WIXELA INHUB) 250-50 MCG/ACT AEPB INHALE 1 PUFF BY MOUTH TWICE A DAY 60 each 9   gabapentin (NEURONTIN) 100 MG capsule Take 1-3 capsules by mouth once to twice daily for pain. (Patient not taking: Reported on 07/21/2022) 90 capsule 0   guaiFENesin-codeine (CHERATUSSIN AC) 100-10 MG/5ML syrup Take 5 mLs by mouth 2 (two) times daily as needed for cough (sedation precautions). 120 mL 0   insulin aspart (NOVOLOG FLEXPEN) 100 UNIT/ML FlexPen Inject 25 Units into the skin 3 (three) times daily with meals. 3 mL PRN   insulin aspart (NOVOLOG) 100 UNIT/ML injection INJECT up to 70 UNITS INTO THE SKIN ONCE FOR 1 DOSE. VIA PUMP 90 mL 3   Insulin Disposable Pump (V-GO 20) 20 UNIT/24HR KIT USE 1X A DAY 90 kit 3   levothyroxine (SYNTHROID) 88 MCG tablet TAKE 1 AND 1/2 TABLET BY MOUTH EVERY SUNDAY. TAKE 1 TABLET BY MOUTH MON THROUGH SAT. TAKE ON AN EMPTY STOMACH WITH WATER ONLY. NO FOOD OR OTHER MEDICATIONS FOR 30 MINUTES. 96 tablet 3   metFORMIN (GLUCOPHAGE-XR) 500 MG 24 hr tablet TAKE 2 TABLETS (1,000 MG TOTAL) BY MOUTH DAILY WITH BREAKFAST. FOR DIABETES. 180 tablet 1   montelukast (SINGULAIR) 10 MG tablet TAKE 1 TABLET (10 MG TOTAL) BY MOUTH AT BEDTIME. FOR ALLERGIES AND ASTHMA. 90 tablet 1   ONETOUCH VERIO test strip USE AS DIRECTED TO CHECK FASTING BLOOD SUGARS THREE TIMES DAILY. INSULIN DEPENDENT DX CODE E11.9 300 strip 1   OZEMPIC, 1 MG/DOSE, 4 MG/3ML SOPN INJECT 1MG   INTO THE SKIN ONCE A WEEK (Patient not taking: Reported on 07/21/2022) 9 mL 4   No current facility-administered medications on file prior to visit.   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 She described it was a small amount in a milk carton in [place]. She states they stopped using it shortly after because the trial wasn't  successful. SPM   Prednisone     Make sugar jump way up.   Family History  Problem Relation Age of Onset   Alzheimer's disease Mother    Asthma Mother    Dementia Mother        cause of death   Arthritis Father    Heart attack Father 65       cause of death   Breast cancer Sister 51   Healthy Daughter    Colon cancer Paternal Grandmother    Liver cancer Maternal Uncle    Healthy Daughter    Healthy Brother    Healthy Sister    Colon polyps Neg Hx    Rectal cancer Neg Hx    Stomach cancer Neg Hx    PE: There were no vitals taken for this visit. Wt Readings from Last 3 Encounters:  07/21/22 216 lb (98 kg)  04/24/22 206 lb 4.8 oz (93.6 kg)  04/15/22 206 lb 6.4 oz (93.6 kg)   Constitutional:  in NAD  The physical exam was not performed (virtual visit).  ASSESSMENT: 1. DM2, insulin-dependent, uncontrolled, without long-term complications, but with hyperglycemia  2. HL  3. Obesity class 1  PLAN:  1. Patient with MEN Insulin-dependent type 2 diabetes, on basal-bolus insulin regimen and weekly GLP-1 receptor agonist along with metformin, with very fluctuating TFTs.  At last visit HbA1c was better, at 7.7%.  At that time, sugars were increasing slightly after breakfast and more significantly after lunch and dinner.  Upon questioning, she was taking a fixed dose of insulin before lunch, 10 units, and also before dinner, 12 units, and we discussed about changing the doses based on her blood sugars and the upcoming meal.  Also, she was correcting her with high blood sugars with 5 units of insulin at bedtime which was likely the reason why the sugars were dropping fairly abruptly after 12 AM.  I advised her to reduce the correction and ideally to bolus more for dinner so that the sugars would not increase as much postprandially. CGM interpretation: -At today's visit, we reviewed her CGM downloads: It appears that 55% of values are in target range (goal >70%), while 45% are higher  than 180 (goal <25%), and 0% are lower than 70 (goal <4%).  The calculated average blood sugar is 180.  The projected HbA1c for the next 3 months (GMI) is 7.6%, compared to the previous 2 weeks, with a predicted A1c was 7.2%. -Reviewing the CGM trends, sugars are improving o/n but then increasing after meals, especially after L and D Most likely 2/2 stress, URI, and coming off Ozempic.  At today's visit, we discussed about trying to get back on the Ozempic, now that it is much more available than before.  I advised her to start at half of the dose, 0.5 mg weekly and then increase to 1 mg weekly if tolerated well. -We will also try to increase her insulin before meals slightly. - I suggested to:  Patient Instructions  Please continue: - Metformin ER 1000  mg with breakfast - VGo20 with 6-7 clicks before b'fast and lunch; for dinner, take 7-8 clicks If sugars before meals <60, do not take Novolog before that meal. If sugars before meals <90, try to move the bolus right before the meal  Try to restart: - Ozempic 1 mg weekly (start with 36 clicks)  Please return in 3-4 months.   - advised to check sugars at different times of the day - 4x a day, rotating check times - advised for yearly eye exams >> she is UTD - return to clinic in 3-4 months  2. HL -Reviewed latest lipid panel from 10/2021: fractions at goal: Lab Results  Component Value Date   CHOL 187 10/31/2021   HDL 79.10 10/31/2021   LDLCALC 92 10/31/2021   TRIG 79.0 10/31/2021   CHOLHDL 2 10/31/2021  -She continues on Lipitor 20 mg daily without side effects  3.  Obesity class I -Will try to restart Ozempic which should also help with weight loss -She follows up on a more plant-based diet -She gained 8 pounds before the last 2 visits combined -she gained 10 lbs since last OV  Carlus Pavlov, MD PhD [facility]

## 2022-07-22 NOTE — Telephone Encounter (Signed)
Pt called to advise she has a viral infection and she wanted to know if you were ok with her coming in while she is still contagious or do you prefer switching her to virtual? She is ok with whichever you recommend.

## 2022-07-22 NOTE — Telephone Encounter (Signed)
Definitely virtual if she can connect through MyChart - video. I have her [facility] download.

## 2022-08-19 ENCOUNTER — Other Ambulatory Visit: Payer: Self-pay | Admitting: Internal Medicine

## 2022-09-29 ENCOUNTER — Other Ambulatory Visit: Payer: Self-pay | Admitting: Primary Care

## 2022-09-29 DIAGNOSIS — J302 Other seasonal allergic rhinitis: Secondary | ICD-10-CM

## 2022-09-29 NOTE — Telephone Encounter (Signed)
Patient is due for CPE/follow up in August, this will be required prior to any further refills.  Please schedule, thank you!   

## 2022-09-29 NOTE — Telephone Encounter (Signed)
Patient has been scheduled. Stated that she doesn't need a refill though.

## 2022-10-07 ENCOUNTER — Ambulatory Visit: Payer: Medicare Other | Admitting: Internal Medicine

## 2022-10-08 ENCOUNTER — Other Ambulatory Visit: Payer: Self-pay | Admitting: Family Medicine

## 2022-10-08 ENCOUNTER — Other Ambulatory Visit: Payer: Self-pay | Admitting: Primary Care

## 2022-10-08 DIAGNOSIS — J4520 Mild intermittent asthma, uncomplicated: Secondary | ICD-10-CM

## 2022-10-08 DIAGNOSIS — J45909 Unspecified asthma, uncomplicated: Secondary | ICD-10-CM

## 2022-10-08 DIAGNOSIS — E039 Hypothyroidism, unspecified: Secondary | ICD-10-CM

## 2022-10-08 DIAGNOSIS — G5793 Unspecified mononeuropathy of bilateral lower limbs: Secondary | ICD-10-CM

## 2022-10-12 ENCOUNTER — Other Ambulatory Visit: Payer: Self-pay | Admitting: Internal Medicine

## 2022-10-15 ENCOUNTER — Encounter (INDEPENDENT_AMBULATORY_CARE_PROVIDER_SITE_OTHER): Payer: Self-pay

## 2022-10-20 ENCOUNTER — Other Ambulatory Visit: Payer: Self-pay | Admitting: Internal Medicine

## 2022-10-20 DIAGNOSIS — E1165 Type 2 diabetes mellitus with hyperglycemia: Secondary | ICD-10-CM

## 2022-10-21 ENCOUNTER — Other Ambulatory Visit: Payer: Self-pay | Admitting: Internal Medicine

## 2022-10-21 ENCOUNTER — Telehealth: Payer: Self-pay

## 2022-10-21 ENCOUNTER — Other Ambulatory Visit (HOSPITAL_COMMUNITY): Payer: Self-pay

## 2022-10-21 DIAGNOSIS — E1165 Type 2 diabetes mellitus with hyperglycemia: Secondary | ICD-10-CM

## 2022-10-21 MED ORDER — INSULIN LISPRO (1 UNIT DIAL) 100 UNIT/ML (KWIKPEN): PEN_INJECTOR | SUBCUTANEOUS | 99 refills | Status: AC

## 2022-10-21 NOTE — Telephone Encounter (Signed)
Per test claims Brand Humalog is preferred and covered, please advise if patient has tried and failed preferred medication.

## 2022-10-21 NOTE — Telephone Encounter (Signed)
Request received from [facility] that Novolog needs PA

## 2022-10-21 NOTE — Telephone Encounter (Signed)
Humalog sent and patient notified

## 2022-10-23 ENCOUNTER — Ambulatory Visit (INDEPENDENT_AMBULATORY_CARE_PROVIDER_SITE_OTHER): Payer: Medicare Other

## 2022-10-23 ENCOUNTER — Inpatient Hospital Stay: Payer: Medicare Other | Attending: Adult Health | Admitting: Adult Health

## 2022-10-23 VITALS — Wt 209.0 lb

## 2022-10-23 DIAGNOSIS — N6091 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: Secondary | ICD-10-CM | POA: Insufficient documentation

## 2022-10-23 DIAGNOSIS — Z800 Family history of malignant neoplasm of digestive organs: Secondary | ICD-10-CM | POA: Diagnosis not present

## 2022-10-23 DIAGNOSIS — Z0000 Encounter for general adult medical examination without abnormal findings: Secondary | ICD-10-CM | POA: Diagnosis not present

## 2022-10-23 DIAGNOSIS — Z1211 Encounter for screening for malignant neoplasm of colon: Secondary | ICD-10-CM

## 2022-10-23 DIAGNOSIS — Z79811 Long term (current) use of aromatase inhibitors: Secondary | ICD-10-CM | POA: Insufficient documentation

## 2022-10-23 NOTE — Progress Notes (Signed)
Subjective:   Molly Wall is a 77 y.o. female who presents for Medicare Annual (Subsequent) preventive examination.  Visit Complete: Virtual  I connected with  Molly Wall on 10/23/22 by a audio enabled telemedicine application and verified that I am speaking with the correct person using two identifiers.  Patient Location: Home  Provider Location: Office/Clinic  I discussed the limitations of evaluation and management by telemedicine. The patient expressed understanding and agreed to proceed.  Vital Signs: Unable to obtain new vitals due to this being a telehealth visit.  Review of Systems     Cardiac Risk Factors include: advanced age (>8men, >54 women);diabetes mellitus;dyslipidemia;obesity (BMI >30kg/m2)     Objective:    Today's Vitals   10/23/22 0912  Weight: 209 lb (94.8 kg)   Body mass index is 34.78 kg/m.     10/23/2022    9:22 AM 10/18/2021    8:38 AM 05/17/2018    1:43 PM 09/15/2017    7:46 AM 08/12/2017    8:32 AM 08/05/2017    3:16 PM 06/12/2016    2:11 PM  Advanced Directives  Does Patient Have a Medical Advance Directive? Yes Yes Yes Yes No No Yes  Type of Estate agent of [place];Living will Healthcare Power of [place];Living will Living will;Healthcare Power of Attorney Living will;Healthcare Power of Attorney   Living will;Healthcare Power of Attorney  Does patient want to make changes to medical advance directive? No - Patient declined        Copy of Healthcare Power of Attorney in Chart? Yes - validated most recent copy scanned in chart (See row information) Yes - validated most recent copy scanned in chart (See row information)  No - copy requested     Would patient like information on creating a medical advance directive?      Yes (MAU/Ambulatory/Procedural Areas - Information given)     Current Medications (verified) Outpatient Encounter Medications as of 10/23/2022  Medication Sig   albuterol  (VENTOLIN HFA) 108 (90 Base) MCG/ACT inhaler INHALE 2 PUFFS BY MOUTH EVERY 6 HOURS AS NEEDED FOR WHEEZE OR SHORTNESS OF BREATH   anastrozole (ARIMIDEX) 1 MG tablet TAKE 1 TABLET BY MOUTH EVERY DAY   atorvastatin (LIPITOR) 20 MG tablet TAKE 1 TABLET BY MOUTH EVERY DAY FOR CHOLESTEROL.   BD PEN NEEDLE NANO 2ND GEN 32G X 4 MM MISC USE AS DIRECTED 3 TIMES A DAY   Continuous Blood Gluc Receiver (DEXCOM G6 RECEIVER) DEVI Use to monitor blood sugar   Continuous Blood Gluc Transmit (DEXCOM G6 TRANSMITTER) MISC 1 Device by Does not apply route every 3 (three) months.   Continuous Glucose Sensor (DEXCOM G6 SENSOR) MISC USE AS INSTRUCTED CHANGE SENSOR EVERY 10 DAYS E11.65   fluticasone (FLONASE) 50 MCG/ACT nasal spray INSTILL 1 SPRAY IN EACH NOSTRIL TWICE A DAY AS NEEDED FOR ALLERGIES OR RHINITIS   fluticasone-salmeterol (WIXELA INHUB) 250-50 MCG/ACT AEPB INHALE 1 PUFF BY MOUTH TWICE A DAY   insulin aspart (NOVOLOG) 100 UNIT/ML injection INJECT up to 70 UNITS INTO THE SKIN ONCE FOR 1 DOSE. VIA PUMP   Insulin Disposable Pump (V-GO 20) 20 UNIT/24HR KIT USE 1X A DAY   insulin lispro (HUMALOG KWIKPEN) 100 UNIT/ML KwikPen INJECT 25 UNITS INTO THE SKIN 3 (THREE) TIMES DAILY WITH MEALS   levothyroxine (SYNTHROID) 88 MCG tablet TAKE 1 AND 1/2 TABLET BY MOUTH EVERY SUNDAY. TAKE 1 TABLET BY MOUTH MON THROUGH SAT. TAKE ON AN EMPTY STOMACH WITH WATER ONLY.  NO FOOD OR OTHER MEDICATIONS FOR 30 MINUTES.   metFORMIN (GLUCOPHAGE-XR) 500 MG 24 hr tablet TAKE 2 TABLETS (1,000 MG TOTAL) BY MOUTH DAILY WITH BREAKFAST. FOR DIABETES.   montelukast (SINGULAIR) 10 MG tablet TAKE 1 TABLET (10 MG TOTAL) BY MOUTH AT BEDTIME. FOR ALLERGIES AND ASTHMA.   ONETOUCH VERIO test strip USE AS DIRECTED TO CHECK FASTING BLOOD SUGARS THREE TIMES DAILY. 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 (Patient not taking: Reported on 07/21/2022)   [DISCONTINUED] guaiFENesin-codeine (CHERATUSSIN AC) 100-10 MG/5ML syrup Take 5 mLs by mouth 2 (two) times daily as needed for cough (sedation precautions).   [DISCONTINUED] insulin aspart (NOVOLOG FLEXPEN) 100 UNIT/ML FlexPen INJECT 25 UNITS INTO THE SKIN 3 (THREE) TIMES DAILY WITH MEALS.   No facility-administered encounter medications on file as of 10/23/2022.    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   Breast cancer (HCC)    Diabetes mellitus without complication (HCC)    Family history of breast cancer    Family history of colon cancer    Hypercholesteremia    Hypothyroidism    Lumbar herniated disc    OSA (obstructive sleep apnea)    used to use CPAP, not any more over 1 1/2 yrs   Salivary stone    left side   Sleep apnea    no cpap in 2 yrs    Vitamin D deficiency    Past Surgical History:  Procedure Laterality Date   APPENDECTOMY     BREAST LUMPECTOMY Right    per pt - precancerous   BREAST LUMPECTOMY WITH RADIOACTIVE SEED LOCALIZATION Right 08/12/2017   Procedure: RIGHT BREAST LUMPECTOMY X'S 2 WITH RADIOACTIVE SEED LOCALIZATION X'S 2;  Surgeon: Claud Kelp, MD;  Location: [facility];  Service: General;  Laterality: Right;   COLONOSCOPY     11 yrs ago in [place] - polyps but given a 10 yr recall per pt    GANGLION CYST EXCISION     77 years old   INCISION AND DRAINAGE ABSCESS ANAL     KNEE SURGERY  2014   SHOULDER SURGERY  1976   TONSILLECTOMY     age 60   Family History  Problem Relation Age of Onset   Alzheimer's disease Mother    Asthma Mother    Dementia Mother        cause of death   Arthritis Father    Heart attack Father 75       cause of death   Breast cancer Sister 59   Healthy Daughter    Colon  cancer Paternal Grandmother    Liver cancer Maternal Uncle    Healthy Daughter    Healthy Brother    Healthy Sister    Colon polyps Neg Hx    Rectal cancer Neg Hx    Stomach cancer Neg Hx    Social History   Socioeconomic History   Marital status: Divorced    Spouse name: Not on file   Number of children: 2   Years of education: Not on file   Highest education level: Not on file  Occupational History  Not on file  Tobacco Use   Smoking status: Never    Passive exposure: Never   Smokeless tobacco: Never  Vaping Use   Vaping status: Never Used  Substance and Sexual Activity   Alcohol use: Yes    Alcohol/week: 0.0 - 1.0 standard drinks of alcohol    Comment: rarely   Drug use: No   Sexual activity: Not Currently  Other Topics Concern   Not on file  Social History Narrative   Single.   Moved from [place] to [place] several weeks ago.   Family lives in [place].   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   Social Determinants of Health   Financial Resource Strain: Low Risk  (10/23/2022)   Overall Financial Resource Strain (CARDIA)    Difficulty of Paying Living Expenses: Not hard at all  Food Insecurity: No Food Insecurity (10/23/2022)   Hunger Vital Sign    Worried About Running Out of Food in the Last Year: Never true    Ran Out of Food in the Last Year: Never true  Transportation Needs: No Transportation Needs (10/23/2022)   PRAPARE - Administrator, Civil Service (Medical): No    Lack of Transportation (Non-Medical): No  Physical Activity: Inactive (10/23/2022)   Exercise Vital Sign    Days of Exercise per Week: 0 days    Minutes of Exercise per Session: 0 min  Stress: No Stress Concern Present (10/23/2022)   [employer] of Occupational Health - Occupational Stress Questionnaire    Feeling of Stress : Not at all  Social Connections: Moderately Integrated (10/23/2022)    Social Connection and Isolation Panel [NHANES]    Frequency of Communication with Friends and Family: More than three times a week    Frequency of Social Gatherings with Friends and Family: More than three times a week    Attends Religious Services: More than 4 times per year    Active Member of [employer] or Organizations: Yes    Attends Banker Meetings: 1 to 4 times per year    Marital Status: Divorced    Tobacco Counseling Counseling given: Not Answered   Clinical Intake:  Pre-visit preparation completed: Yes  Pain : No/denies pain     BMI - recorded: 34.78 Nutritional Status: BMI > 30  Obese Nutritional Risks: None Diabetes: Yes CBG done?: Yes (per pt 109) CBG resulted in Enter/ Edit results?: No Did pt. bring in CBG monitor from home?: No  How often do you need to have someone help you when you read instructions, pamphlets, or other written materials from your doctor or pharmacy?: 1 - Never  Interpreter Needed?: No  Information entered by :: Lanier Ensign, LPN   Activities of Daily Living    10/23/2022    9:14 AM  In your present state of health, do you have any difficulty performing the following activities:  Hearing? 0  Vision? 0  Difficulty concentrating or making decisions? 0  Walking or climbing stairs? 0  Dressing or bathing? 0  Doing errands, shopping? 0  Preparing Food and eating ? N  Using the Toilet? N  In the past six months, have you accidently leaked urine? N  Do you have problems with loss of bowel control? N  Managing your Medications? N  Managing your Finances? N  Housekeeping or managing your Housekeeping? N    Patient Care Team: Doreene Nest, NP  as PCP - General (Nurse Practitioner) Sherrilyn Rist, MD as Consulting Physician (Gastroenterology) Axel Filler Larna Daughters, NP as Nurse Practitioner (Hematology and Oncology)  Indicate any recent Medical Services you may have received from other than [facility] providers in the  past year (date may be approximate).     Assessment:   This is a routine wellness examination for [place].  Hearing/Vision screen Hearing Screening - Comments:: Pt denies any hearing issues  Vision Screening - Comments:: Pt follows up with Dr Clydene Pugh for annual eye exams   Dietary issues and exercise activities discussed:     Goals Addressed             This Visit's Progress    Patient Stated       Get A1C down        Depression Screen    10/23/2022    9:20 AM 07/21/2022   10:58 AM 10/18/2021    8:40 AM 04/25/2021    2:58 PM 11/29/2019   10:33 AM 05/17/2018    1:43 PM 06/12/2016    2:23 PM  PHQ 2/9 Scores  PHQ - 2 Score 0 0 0 0 0 0 0  PHQ- 9 Score    0 0      Fall Risk    10/23/2022    9:22 AM 10/18/2021    8:40 AM 10/17/2021   12:43 PM 04/25/2021    2:57 PM 11/29/2019   10:33 AM  Fall Risk   Falls in the past year? 0 0 0 1 0  Number falls in past yr: 0 0  0 0  Injury with Fall? 0 0  1   Risk for fall due to : Impaired vision Medication side effect;Impaired balance/gait  No Fall Risks   Follow up Falls prevention discussed Falls evaluation completed;Education provided;Falls prevention discussed       MEDICARE RISK AT HOME:  Medicare Risk at Home - 10/23/22 0865     Any stairs in or around the home? Yes    If so, are there any without handrails? No    Home free of loose throw rugs in walkways, pet beds, electrical cords, etc? Yes    Adequate lighting in your home to reduce risk of falls? Yes    Life alert? No    Use of a cane, walker or w/c? No    Grab bars in the bathroom? No    Shower chair or bench in shower? No    Elevated toilet seat or a handicapped toilet? No             TIMED UP AND GO:  Was the test performed?  No    Cognitive Function:        10/23/2022    9:23 AM 10/18/2021    8:41 AM  6CIT Screen  What Year? 0 points 0 points  What month? 0 points 0 points  What time? 0 points 0 points  Count back from 20 0 points 0 points  Months in reverse  0 points 0 points  Repeat phrase 0 points 8 points  Total Score 0 points 8 points    Immunizations Immunization History  Administered Date(s) Administered   Fluad Quad(high Dose 65+) 12/01/2018, 11/29/2019   Influenza,inj,Quad PF,6+ Mos 01/19/2015, 12/28/2015, 12/25/2016, 12/09/2017   Pneumococcal Conjugate-13 12/01/2018   Pneumococcal Polysaccharide-23 03/17/2014   Zoster, Live 07/18/2015    TDAP status: Due, Education has been provided regarding the importance of this vaccine. Advised may receive this vaccine at  local pharmacy or Health Dept. Aware to provide a copy of the vaccination record if obtained from local pharmacy or Health Dept. Verbalized acceptance and understanding.  Flu Vaccine status: Due, Education has been provided regarding the importance of this vaccine. Advised may receive this vaccine at local pharmacy or Health Dept. Aware to provide a copy of the vaccination record if obtained from local pharmacy or Health Dept. Verbalized acceptance and understanding.  Pneumococcal vaccine status: Up to date  Covid-19 vaccine status: Declined, Education has been provided regarding the importance of this vaccine but patient still declined. Advised may receive this vaccine at local pharmacy or Health Dept.or vaccine clinic. Aware to provide a copy of the vaccination record if obtained from local pharmacy or Health Dept. Verbalized acceptance and understanding.  Qualifies for Shingles Vaccine? No  per pt   Screening Tests Health Maintenance  Topic Date Due   DTaP/Tdap/Td (1 - Tdap) Never done   Colonoscopy  09/16/2022   HEMOGLOBIN A1C  10/14/2022   INFLUENZA VACCINE  10/16/2022   Diabetic kidney evaluation - Urine ACR  11/01/2022   FOOT EXAM  12/10/2022   Diabetic kidney evaluation - eGFR measurement  01/24/2023   MAMMOGRAM  06/17/2023   OPHTHALMOLOGY EXAM  07/14/2023   Medicare Annual Wellness (AWV)  10/23/2023   Pneumonia Vaccine 100+ Years old  Completed   DEXA SCAN   Completed   Hepatitis C Screening  Completed   HPV VACCINES  Aged Out   COVID-19 Vaccine  Discontinued   Zoster Vaccines- Shingrix  Discontinued    Health Maintenance  Health Maintenance Due  Topic Date Due   DTaP/Tdap/Td (1 - Tdap) Never done   Colonoscopy  09/16/2022   HEMOGLOBIN A1C  10/14/2022   INFLUENZA VACCINE  10/16/2022   Diabetic kidney evaluation - Urine ACR  11/01/2022    Colorectal cancer screening: Referral to GI placed 10/23/22. Pt aware the office will call re: appt.  Mammogram status: Completed 06/17/22. Repeat every year  Bone Density status: Ordered scheduled 06/17/22 . Pt provided with contact info and advised to call to schedule appt.   Additional Screening:  Hepatitis C Screening: Completed 11/16/20  Vision Screening: Recommended annual ophthalmology exams for early detection of glaucoma and other disorders of the eye. Is the patient up to date with their annual eye exam?  Yes  Who is the provider or what is the name of the office in which the patient attends annual eye exams? Dr Clydene Pugh  If pt is not established with a provider, would they like to be referred to a provider to establish care? No .   Dental Screening: Recommended annual dental exams for proper oral hygiene  Diabetic Foot Exam: Diabetic Foot Exam: Completed 07/14/22  Community Resource Referral / Chronic Care Management: CRR required this visit?  No   CCM required this visit?  No     Plan:     I have personally reviewed and noted the following in the patient's chart:   Medical and social history Use of alcohol, tobacco or illicit drugs  Current medications and supplements including opioid prescriptions. Patient is not currently taking opioid prescriptions. Functional ability and status Nutritional status Physical activity Advanced directives List of other physicians Hospitalizations, surgeries, and ER visits in previous 12 months Vitals Screenings to include cognitive,  depression, and falls Referrals and appointments  In addition, I have reviewed and discussed with patient certain preventive protocols, quality metrics, and best practice recommendations. A written personalized care plan for preventive  services as well as general preventive health recommendations were provided to patient.     Marzella Schlein, LPN   07/21/4330   After Visit Summary: (MyChart) Due to this being a telephonic visit, the after visit summary with patients personalized plan was offered to patient via MyChart   Nurse Notes: none

## 2022-10-23 NOTE — Patient Instructions (Signed)
[name] , Thank you for taking time to come for your Medicare Wellness Visit. I appreciate your ongoing commitment to your health goals. Please review the following plan we discussed and let me know if I can assist you in the future.   Referrals/Orders/Follow-Ups/Clinician Recommendations: keep a1c down   This is a list of the screening recommended for you and due dates:  Health Maintenance  Topic Date Due   DTaP/Tdap/Td vaccine (1 - Tdap) Never done   Colon Cancer Screening  09/16/2022   Medicare Annual Wellness Visit  10/19/2022   Hemoglobin A1C  10/14/2022   Flu Shot  10/16/2022   Yearly kidney health urinalysis for diabetes  11/01/2022   Complete foot exam   12/10/2022   Yearly kidney function blood test for diabetes  01/24/2023   Mammogram  06/17/2023   Eye exam for diabetics  07/14/2023   Pneumonia Vaccine  Completed   DEXA scan (bone density measurement)  Completed   Hepatitis C Screening  Completed   HPV Vaccine  Aged Out   COVID-19 Vaccine  Discontinued   Zoster (Shingles) Vaccine  Discontinued    Advanced directives: (In Chart) A copy of your advanced directives are scanned into your chart should your provider ever need it.  Next Medicare Annual Wellness Visit scheduled for next year: Yes  Preventive Care 60 Years and Older, Female Preventive care refers to lifestyle choices and visits with your health care provider that can promote health and wellness. What does preventive care include? A yearly physical exam. This is also called an annual well check. Dental exams once or twice a year. Routine eye exams. Ask your health care provider how often you should have your eyes checked. Personal lifestyle choices, including: Daily care of your teeth and gums. Regular physical activity. Eating a healthy diet. Avoiding tobacco and drug use. Limiting alcohol use. Practicing safe sex. Taking low-dose aspirin every day. Taking vitamin and mineral supplements as  recommended by your health care provider. What happens during an annual well check? The services and screenings done by your health care provider during your annual well check will depend on your age, overall health, lifestyle risk factors, and family history of disease. Counseling  Your health care provider may ask you questions about your: Alcohol use. Tobacco use. Drug use. Emotional well-being. Home and relationship well-being. Sexual activity. Eating habits. History of falls. Memory and ability to understand (cognition). Work and work Astronomer. Reproductive health. Screening  You may have the following tests or measurements: Height, weight, and BMI. Blood pressure. Lipid and cholesterol levels. These may be checked every 5 years, or more frequently if you are over 26 years old. Skin check. Lung cancer screening. You may have this screening every year starting at age 58 if you have a 30-pack-year history of smoking and currently smoke or have quit within the past 15 years. Fecal occult blood test (FOBT) of the stool. You may have this test every year starting at age 67. Flexible sigmoidoscopy or colonoscopy. You may have a sigmoidoscopy every 5 years or a colonoscopy every 10 years starting at age 21. Hepatitis C blood test. Hepatitis B blood test. Sexually transmitted disease (STD) testing. Diabetes screening. This is done by checking your blood sugar (glucose) after you have not eaten for a while (fasting). You may have this done every 1-3 years. Bone density scan. This is done to screen for osteoporosis. You may have this done starting at age 33. Mammogram. This may be done  every 1-2 years. Talk to your health care provider about how often you should have regular mammograms. Talk with your health care provider about your test results, treatment options, and if necessary, the need for more tests. Vaccines  Your health care provider may recommend certain vaccines, such  as: Influenza vaccine. This is recommended every year. Tetanus, diphtheria, and acellular pertussis (Tdap, Td) vaccine. You may need a Td booster every 10 years. Zoster vaccine. You may need this after age 44. Pneumococcal 13-valent conjugate (PCV13) vaccine. One dose is recommended after age 31. Pneumococcal polysaccharide (PPSV23) vaccine. One dose is recommended after age 4. Talk to your health care provider about which screenings and vaccines you need and how often you need them. This information is not intended to replace advice given to you by your health care provider. Make sure you discuss any questions you have with your health care provider. Document Released: 03/30/2015 Document Revised: 11/21/2015 Document Reviewed: 01/02/2015 Elsevier Interactive Patient Education  2017 [employer].  Fall Prevention in the Home Falls can cause injuries. They can happen to people of all ages. There are many things you can do to make your home safe and to help prevent falls. What can I do on the outside of my home? Regularly fix the edges of walkways and driveways and fix any cracks. Remove anything that might make you trip as you walk through a door, such as a raised step or threshold. Trim any bushes or trees on the path to your home. Use bright outdoor lighting. Clear any walking paths of anything that might make someone trip, such as rocks or tools. Regularly check to see if handrails are loose or broken. Make sure that both sides of any steps have handrails. Any raised decks and porches should have guardrails on the edges. Have any leaves, snow, or ice cleared regularly. Use sand or salt on walking paths during winter. Clean up any spills in your garage right away. This includes oil or grease spills. What can I do in the bathroom? Use night lights. Install grab bars by the toilet and in the tub and shower. Do not use towel bars as grab bars. Use non-skid mats or decals in the tub or  shower. If you need to sit down in the shower, use a plastic, non-slip stool. Keep the floor dry. Clean up any water that spills on the floor as soon as it happens. Remove soap buildup in the tub or shower regularly. Attach bath mats securely with double-sided non-slip rug tape. Do not have throw rugs and other things on the floor that can make you trip. What can I do in the bedroom? Use night lights. Make sure that you have a light by your bed that is easy to reach. Do not use any sheets or blankets that are too big for your bed. They should not hang down onto the floor. Have a firm chair that has side arms. You can use this for support while you get dressed. Do not have throw rugs and other things on the floor that can make you trip. What can I do in the kitchen? Clean up any spills right away. Avoid walking on wet floors. Keep items that you use a lot in easy-to-reach places. If you need to reach something above you, use a strong step stool that has a grab bar. Keep electrical cords out of the way. Do not use floor polish or wax that makes floors slippery. If you must use wax, use  non-skid floor wax. Do not have throw rugs and other things on the floor that can make you trip. What can I do with my stairs? Do not leave any items on the stairs. Make sure that there are handrails on both sides of the stairs and use them. Fix handrails that are broken or loose. Make sure that handrails are as long as the stairways. Check any carpeting to make sure that it is firmly attached to the stairs. Fix any carpet that is loose or worn. Avoid having throw rugs at the top or bottom of the stairs. If you do have throw rugs, attach them to the floor with carpet tape. Make sure that you have a light switch at the top of the stairs and the bottom of the stairs. If you do not have them, ask someone to add them for you. What else can I do to help prevent falls? Wear shoes that: Do not have high heels. Have  rubber bottoms. Are comfortable and fit you well. Are closed at the toe. Do not wear sandals. If you use a stepladder: Make sure that it is fully opened. Do not climb a closed stepladder. Make sure that both sides of the stepladder are locked into place. Ask someone to hold it for you, if possible. Clearly mark and make sure that you can see: Any grab bars or handrails. First and last steps. Where the edge of each step is. Use tools that help you move around (mobility aids) if they are needed. These include: Canes. Walkers. Scooters. Crutches. Turn on the lights when you go into a dark area. Replace any light bulbs as soon as they burn out. Set up your furniture so you have a clear path. Avoid moving your furniture around. If any of your floors are uneven, fix them. If there are any pets around you, be aware of where they are. Review your medicines with your doctor. Some medicines can make you feel dizzy. This can increase your chance of falling. Ask your doctor what other things that you can do to help prevent falls. This information is not intended to replace advice given to you by your health care provider. Make sure you discuss any questions you have with your health care provider. Document Released: 12/28/2008 Document Revised: 08/09/2015 Document Reviewed: 04/07/2014 Elsevier Interactive Patient Education  2017 [employer].

## 2022-10-24 ENCOUNTER — Telehealth: Payer: Self-pay | Admitting: Adult Health

## 2022-10-30 ENCOUNTER — Encounter (INDEPENDENT_AMBULATORY_CARE_PROVIDER_SITE_OTHER): Payer: Self-pay

## 2022-11-06 ENCOUNTER — Encounter: Payer: Medicare Other | Admitting: Primary Care

## 2022-11-11 ENCOUNTER — Inpatient Hospital Stay (HOSPITAL_BASED_OUTPATIENT_CLINIC_OR_DEPARTMENT_OTHER): Payer: Medicare Other | Admitting: Adult Health

## 2022-11-11 ENCOUNTER — Encounter: Payer: Self-pay | Admitting: Adult Health

## 2022-11-11 VITALS — BP 128/67 | HR 79 | Temp 97.6°F | Resp 18 | Ht 65.0 in | Wt 217.0 lb

## 2022-11-11 DIAGNOSIS — Z9189 Other specified personal risk factors, not elsewhere classified: Secondary | ICD-10-CM | POA: Diagnosis not present

## 2022-11-11 DIAGNOSIS — N6091 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: Secondary | ICD-10-CM | POA: Diagnosis present

## 2022-11-11 DIAGNOSIS — Z79811 Long term (current) use of aromatase inhibitors: Secondary | ICD-10-CM | POA: Diagnosis not present

## 2022-11-11 NOTE — Progress Notes (Signed)
[facility] Cancer Follow up:    Molly Nest, NP [address] [place] [address]   DIAGNOSIS: At high risk for breast cancer: ADH and ALH  SUMMARY OF ONCOLOGIC HISTORY: Molly Wall, [place] woman status post right breast biopsy x2 06/26/2017 showing atypical ductal hyperplasia and atypical lobular hyperplasia   (1) right lumpectomy x2 on 08/12/2017 shows no evidence of malignancy at either site   (2) genetics testing 11/23/2017 through the Hereditary Gene Panel offered by [facility] found no deleterious mutations in APC, ATM, AXIN2, BARD1, BMPR1A, BRCA1, BRCA2, BRIP1, CDH1, CDK4, CDKN2A (p14ARF), CDKN2A (p16INK4a), CHEK2, CTNNA1, DICER1, EPCAM (Deletion/duplication testing only), GREM1 (promoter region deletion/duplication testing only), KIT, MEN1, MLH1, MSH2, MSH3, MSH6, MUTYH, NBN, NF1, NHTL1, PALB2, PDGFRA, PMS2, POLD1, POLE, PTEN, RAD50, RAD51C, RAD51D, SDHB, SDHC, SDHD, SMAD4, SMARCA4. STK11, TP53, TSC1, TSC2, and VHL.  The following genes were evaluated for sequence changes only: SDHA and HOXB13 c.251G>A variant only.   (3) breast cancer high risk: intensified screening:             (a) yearly mammography in May             (b) yearly breast MRI in November             (c) biannual MD breast exam             Per: [email or web address]   (4) breast cancer high risk: risk reduction             (a) anastrozole started 01/05/2018             Per: [email or web address]  CURRENT THERAPY: Anastrozole  INTERVAL HISTORY: Molly Wall 77 y.o. female returns for follow-up of her increased risk for breast cancer.  Her most recent mammogram occurred on June 17, 2022 demonstrating no mammographic evidence of malignancy and breast density category B.  Her most recent breast MRI occurred on November 06, 2021 demonstrating an unremarkable breast MRI without evidence of  malignancy and breast density category B.  She is feeling moderately well.  She follows up with her primary care provider regularly she sees endocrinology for her diabetes.   Patient Active Problem List   Diagnosis Date Noted   Acute respiratory infection 07/21/2022   RUQ abdominal pain 01/31/2022   Adventitious breath sounds 11/20/2021   Neuropathic pain of both feet 10/31/2021   GERD (gastroesophageal reflux disease) 04/25/2021   Vegan diet 02/21/2020   Word finding difficulty 02/21/2020   Arthralgia of both hands 11/29/2019   Seasonal allergic rhinitis 11/29/2019   Chronic knee pain (intermittent) (Right) 11/01/2018   Chronic pain syndrome 10/31/2018   Flank pain 10/13/2018   Shortness of breath 10/13/2018   History iodine allergy 09/28/2018    Class: History of   DDD (degenerative disc disease), lumbar 09/27/2018   Grade 1 Anterolisthesis of L5/S1 09/16/2018   Lumbar facet arthropathy 09/16/2018   Arthropathy of lumbosacral facet joint 09/16/2018   Sialadenitis 08/24/2018   Genetic testing 11/27/2017   Atypical lobular hyperplasia [facility]) of right breast 11/06/2017   Family history of breast cancer    Family history of colon cancer    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   Constipation 04/13/2017   Chronic low back pain (Secondary area of Pain) (Bilateral) (R>L) 02/13/2016   Chronic lower extremity pain (Primary Area of Pain) (Right) 02/13/2016   Chronic lumbar radicular pain (Right) 02/13/2016   Type 2 diabetes mellitus with hyperglycemia, with long-term current use  of insulin (HCC) 07/18/2015   Vitamin D deficiency 07/18/2015   Hyperlipidemia 12/13/2014   Asthma 12/13/2014   Hypothyroidism 12/13/2014   Lumbar herniated disc 12/13/2014    is allergic to iodine-131 and prednisone.  MEDICAL HISTORY: 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   Breast  cancer (HCC)    Diabetes mellitus without complication (HCC)    Family history of breast cancer    Family history of colon cancer    Hypercholesteremia    Hypothyroidism    Lumbar herniated disc    OSA (obstructive sleep apnea)    used to use CPAP, not any more over 1 1/2 yrs   Salivary stone    left side   Sleep apnea    no cpap in 2 yrs    Vitamin D deficiency     SURGICAL HISTORY: Past Surgical History:  Procedure Laterality Date   APPENDECTOMY     BREAST LUMPECTOMY Right    per pt - precancerous   BREAST LUMPECTOMY WITH RADIOACTIVE SEED LOCALIZATION Right 08/12/2017   Procedure: RIGHT BREAST LUMPECTOMY X'S 2 WITH RADIOACTIVE SEED LOCALIZATION X'S 2;  Surgeon: Claud Kelp, MD;  Location: [facility];  Service: General;  Laterality: Right;   COLONOSCOPY     11 yrs ago in [place] - polyps but given a 10 yr recall per pt    GANGLION CYST EXCISION     77 years old   INCISION AND DRAINAGE ABSCESS ANAL     KNEE SURGERY  2014   SHOULDER SURGERY  1976   TONSILLECTOMY     age 31    SOCIAL HISTORY: Social History   Socioeconomic History   Marital status: Divorced    Spouse name: Not on file   Number of children: 2   Years of education: Not on file   Highest education level: Not on file  Occupational History   Not on file  Tobacco Use   Smoking status: Never    Passive exposure: Never   Smokeless tobacco: Never  Vaping Use   Vaping status: Never Used  Substance and Sexual Activity   Alcohol use: Yes    Alcohol/week: 0.0 - 1.0 standard drinks of alcohol    Comment: rarely   Drug use: No   Sexual activity: Not Currently  Other Topics Concern   Not on file  Social History Narrative   Single.   Moved from [place] to [place] several weeks ago.   Family lives in [place].   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   Social Determinants of Health   Financial  Resource Strain: Low Risk  (10/23/2022)   Overall Financial Resource Strain (CARDIA)    Difficulty of Paying Living Expenses: Not hard at all  Food Insecurity: No Food Insecurity (10/23/2022)   Hunger Vital Sign    Worried About Running Out of Food in the Last Year: Never true    Ran Out of Food in the Last Year: Never true  Transportation Needs: No Transportation Needs (10/23/2022)   PRAPARE - Administrator, Civil Service (Medical): No    Lack of Transportation (Non-Medical): No  Physical Activity: Inactive (10/23/2022)   Exercise Vital Sign    Days of Exercise per Week: 0 days    Minutes of Exercise per  Session: 0 min  Stress: No Stress Concern Present (10/23/2022)   [employer] of Occupational Health - Occupational Stress Questionnaire    Feeling of Stress : Not at all  Social Connections: Moderately Integrated (10/23/2022)   Social Connection and Isolation Panel [NHANES]    Frequency of Communication with Friends and Family: More than three times a week    Frequency of Social Gatherings with Friends and Family: More than three times a week    Attends Religious Services: More than 4 times per year    Active Member of [employer] or Organizations: Yes    Attends Banker Meetings: 1 to 4 times per year    Marital Status: Divorced  Intimate Partner Violence: Not At Risk (10/23/2022)   Humiliation, Afraid, Rape, and Kick questionnaire    Fear of Current or Ex-Partner: No    Emotionally Abused: No    Physically Abused: No    Sexually Abused: No    FAMILY HISTORY: Family History  Problem Relation Age of Onset   Alzheimer's disease Mother    Asthma Mother    Dementia Mother        cause of death   Arthritis Father    Heart attack Father 100       cause of death   Breast cancer Sister 64   Healthy Daughter    Colon cancer Paternal Grandmother    Liver cancer Maternal Uncle    Healthy Daughter    Healthy Brother    Healthy Sister    Colon polyps Neg Hx     Rectal cancer Neg Hx    Stomach cancer Neg Hx     Review of Systems  Constitutional:  Negative for appetite change, chills, fatigue, fever and unexpected weight change.  HENT:   Negative for hearing loss, lump/mass and trouble swallowing.   Eyes:  Negative for eye problems and icterus.  Respiratory:  Negative for chest tightness, cough and shortness of breath.   Cardiovascular:  Negative for chest pain, leg swelling and palpitations.  Gastrointestinal:  Negative for abdominal distention, abdominal pain, constipation, diarrhea, nausea and vomiting.  Endocrine: Negative for hot flashes.  Genitourinary:  Negative for difficulty urinating.   Musculoskeletal:  Negative for arthralgias.  Skin:  Negative for itching and rash.  Neurological:  Negative for dizziness, extremity weakness, headaches and numbness.  Hematological:  Negative for adenopathy. Does not bruise/bleed easily.  Psychiatric/Behavioral:  Negative for depression. The patient is not nervous/anxious.       PHYSICAL EXAMINATION   Onc Performance Status - 11/11/22 1011       ECOG Perf Status   ECOG Perf Status Restricted in physically strenuous activity but ambulatory and able to carry out work of a light or sedentary nature, e.g., light house work, office work      KPS SCALE   KPS % SCORE Able to carry on normal activity, minor s/s of disease             Vitals:   11/11/22 1008  BP: 128/67  Pulse: 79  Resp: 18  Temp: 97.6 F (36.4 C)  SpO2: 97%    Physical Exam Constitutional:      General: She is not in acute distress.    Appearance: Normal appearance. She is not toxic-appearing.  HENT:     Head: Normocephalic and atraumatic.     Mouth/Throat:     Mouth: Mucous membranes are moist.     Pharynx: Oropharynx is clear. No oropharyngeal exudate or posterior  oropharyngeal erythema.  Eyes:     General: No scleral icterus. Cardiovascular:     Rate and Rhythm: Normal rate and regular rhythm.     Pulses:  Normal pulses.     Heart sounds: Normal heart sounds.  Pulmonary:     Effort: Pulmonary effort is normal.     Breath sounds: Normal breath sounds.  Chest:     Comments: Right breast status postlumpectomy, benign, left breast is benign.  Lateral axillae are benign. Abdominal:     General: Abdomen is flat. Bowel sounds are normal. There is no distension.     Palpations: Abdomen is soft.     Tenderness: There is no abdominal tenderness.  Musculoskeletal:        General: No swelling.     Cervical back: Neck supple.  Lymphadenopathy:     Cervical: No cervical adenopathy.  Skin:    General: Skin is warm and dry.     Findings: No rash.  Neurological:     General: No focal deficit present.     Mental Status: She is alert.  Psychiatric:        Mood and Affect: Mood normal.        Behavior: Behavior normal.         ASSESSMENT and THERAPY PLAN:   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 Molly Wall is a 77 year old woman here today for f/u of her increased breast cancer risk.  She has no signs of cancer during today's visit and continues on Anastrozole with good tolerance.    At high risk for breast cancer: She will complete anastrozole at the end of October 2024.  She will still be eligible for intensified breast cancer screening and will undergo breast MRI in October 2024 and mammogram in April 2025.  We will see her back in 1 year for continued high risk follow-up. Health maintenance: We discussed healthy diet and exercise.  She will continue to see her primary care provider regularly and endocrinology for diabetes.  I gave her handouts on exercise and nutrition from the [employer] of lifestyle medicine.  She sounds like she has a good handle on nutrition as she is following a diabetic diet closely.  Molly Wall will return in 1 year for continued high risk follow-up.    All questions were answered. The patient knows to call the clinic with any problems, questions or concerns.  We can certainly see the patient much sooner if necessary.  Total encounter time:30 minutes*in face-to-face visit time, chart review, lab review, care coordination, order entry, and documentation of the encounter time.    Lillard Anes, NP 11/11/22 10:46 AM Medical Oncology and Hematology [facility] [address] [place], [place] [address] Tel. [phone]    Fax. [phone]  *Total Encounter Time as defined by the Centers for Medicare and Medicaid Services includes, in addition to the face-to-face time of a patient visit (documented in the note above) non-face-to-face time: obtaining and reviewing outside history, ordering and reviewing medications, tests or procedures, care coordination (communications with other health care professionals or caregivers) and documentation in the medical record.

## 2022-11-11 NOTE — Assessment & Plan Note (Signed)
Molly Wall is a 77 year old woman here today for f/u of her increased breast cancer risk.  She has no signs of cancer during today's visit and continues on Anastrozole with good tolerance.    At high risk for breast cancer: She will complete anastrozole at the end of October 2024.  She will still be eligible for intensified breast cancer screening and will undergo breast MRI in October 2024 and mammogram in April 2025.  We will see her back in 1 year for continued high risk follow-up. Health maintenance: We discussed healthy diet and exercise.  She will continue to see her primary care provider regularly and endocrinology for diabetes.  I gave her handouts on exercise and nutrition from the [employer] of lifestyle medicine.  She sounds like she has a good handle on nutrition as she is following a diabetic diet closely.  Eimile will return in 1 year for continued high risk follow-up.

## 2022-11-18 ENCOUNTER — Encounter: Payer: Self-pay | Admitting: Internal Medicine

## 2022-11-18 ENCOUNTER — Ambulatory Visit (INDEPENDENT_AMBULATORY_CARE_PROVIDER_SITE_OTHER): Payer: Medicare Other | Admitting: Internal Medicine

## 2022-11-18 VITALS — BP 138/80 | HR 78 | Ht 65.0 in | Wt 217.6 lb

## 2022-11-18 DIAGNOSIS — Z7985 Long-term (current) use of injectable non-insulin antidiabetic drugs: Secondary | ICD-10-CM

## 2022-11-18 DIAGNOSIS — Z794 Long term (current) use of insulin: Secondary | ICD-10-CM | POA: Diagnosis not present

## 2022-11-18 DIAGNOSIS — E785 Hyperlipidemia, unspecified: Secondary | ICD-10-CM

## 2022-11-18 DIAGNOSIS — E1165 Type 2 diabetes mellitus with hyperglycemia: Secondary | ICD-10-CM | POA: Diagnosis not present

## 2022-11-18 DIAGNOSIS — E039 Hypothyroidism, unspecified: Secondary | ICD-10-CM

## 2022-11-18 DIAGNOSIS — Z7984 Long term (current) use of oral hypoglycemic drugs: Secondary | ICD-10-CM

## 2022-11-18 DIAGNOSIS — E669 Obesity, unspecified: Secondary | ICD-10-CM

## 2022-11-18 DIAGNOSIS — E66812 Obesity, class 2: Secondary | ICD-10-CM

## 2022-11-18 LAB — POCT GLYCOSYLATED HEMOGLOBIN (HGB A1C): Hemoglobin A1C: 7.4 % — AB (ref 4.0–5.6)

## 2022-11-18 LAB — GLUCOSE, POCT (MANUAL RESULT ENTRY)
POC Glucose: 267 mg/dL — AB (ref 70–99)
POC Glucose: 306 mg/dL — AB (ref 70–99)

## 2022-11-18 MED ORDER — V-GO 30 30 UNIT/24HR KIT: 1.0000 | PACK | Freq: Every day | 5 refills | Status: DC

## 2022-11-18 NOTE — Progress Notes (Addendum)
Patient ID: Molly Wall, female   DOB: 1945/11/15, 77 y.o.   MRN: [id number]   HPI: Molly Wall is a 77 y.o.-year-old female, initially referred by her PCP, Vernona Rieger, MD, returning for follow-up for DM2, dx at 77 (2017), h/o reactive hypoglycemia, insulin-dependent, uncontrolled, without long-term complications.  Last visit 3 months ago.  Interim history: She denies increased urination, blurry vision, nausea, chest pain. She has imbalance. Tried metamucin, dulcolax, enemas, now using a supplement by Dr. Ethelene Browns >> helping.  At today's visit, she was dizzy and blood sugar checked in the office was checked repeatedly: Sugars were 267 and 306.  Upon questioning, she thinks she forgot to take her insulin before lunch.  Patient was given water and she gave boluses of insulin (a total of 10 units) before leaving the office.  Reviewed HbA1c levels: Lab Results  Component Value Date   HGBA1C 7.7 (A) 04/15/2022   HGBA1C 8.4 (A) 12/09/2021   HGBA1C 7.4 (A) 08/08/2021   HGBA1C 7.2 (A) 04/04/2021   HGBA1C 7.1 (A) 01/01/2021   HGBA1C 6.8 (A) 09/20/2020   HGBA1C 8.1 (A) 06/21/2020   HGBA1C 8.7 (A) 03/19/2020   HGBA1C 8.2 (A) 09/07/2019   HGBA1C 9.4 (H) 06/06/2019   She is on: - Metformin ER 1000 mg with breakfast - Ozempic 0.5 >> 1 mg weekly >> off  for 2-3 mo >> restarted - VGo 30 >> ... >>- VGo20 with 5-7 clicks before meals +/- Novolog 2-4 units for correction (when on steroids) >> Humalog 6-7 clicks before b'fast and lunch; for dinner, take 7-8 clicks For dinner, take 7-8 clicks (14-16 units). If sugars before meals <60, do not take Novolog before that meal. If sugars before meals <90, try to move the bolus right before the meal  Previously on: - Lantus 35 >> 25 units in am >> 25 units in a.m. and 10 units at bedtime >> 20 units in a.m. and 10 units at night - Novolog 7-9 units before 15 min before meals >> B'fast: 6-8 units  Lunch: 10-11 units Dinner: 6-7  units  She checks her sugars more than 4 times a day with her CGM:   Previously:   Previously:   Lowest sugar was 80s >> 40s >> 40s >> 40s; she has hypoglycemia awareness in the 80s. Highest sugar was 565 >> 500s >> >600 >> HI.  Glucometer: One Touch Verio  -No CKD, last BUN/creatinine:  Lab Results  Component Value Date   BUN 12 01/23/2022   BUN 14 10/15/2021   CREATININE 0.71 01/23/2022   CREATININE 0.78 10/15/2021  Not on ACE inhibitor/ARB.  -No HL; last set of lipids: Lab Results  Component Value Date   CHOL 187 10/31/2021   HDL 79.10 10/31/2021   LDLCALC 92 10/31/2021   TRIG 79.0 10/31/2021   CHOLHDL 2 10/31/2021  On Lipitor 20.  - last eye exam was 07/14/2022: No DR reportedly, + cataracts.   -No numbness but has tingling in her feet. She has pain in feet. Last foot exam: 12/09/2021.  Pt has FH of DM in [place].  She has OSA and started a CPAP after which she felt much better. 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   She is on levothyroxine 88 mcg 1 tablet 6/7 days and 1.5 tablets  1/7 days.   Latest TSH was normal: Lab Results  Component Value Date   TSH 2.86 10/31/2021   She lives alone.  ROS: + See HPI  I reviewed pt's medications, allergies, PMH, social hx, family hx, and changes were documented in the history of present illness. Otherwise, unchanged from my initial visit note.  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   Breast cancer (HCC)    Diabetes mellitus without complication (HCC)    Family history of breast cancer    Family history of colon cancer    Hypercholesteremia    Hypothyroidism    Lumbar herniated disc    OSA (obstructive sleep apnea)    used to use CPAP,  not any more over 1 1/2 yrs   Salivary stone    left side   Sleep apnea    no cpap in 2 yrs    Vitamin D deficiency    Past Surgical History:  Procedure Laterality Date   APPENDECTOMY     BREAST LUMPECTOMY Right    per pt - precancerous   BREAST LUMPECTOMY WITH RADIOACTIVE SEED LOCALIZATION Right 08/12/2017   Procedure: RIGHT BREAST LUMPECTOMY X'S 2 WITH RADIOACTIVE SEED LOCALIZATION X'S 2;  Surgeon: Claud Kelp, MD;  Location:  [facility];  Service: General;  Laterality: Right;   COLONOSCOPY     11 yrs ago in [place] - polyps but given a 10 yr recall per pt    GANGLION CYST EXCISION     77 years old   INCISION AND DRAINAGE ABSCESS ANAL     KNEE SURGERY  2014   SHOULDER SURGERY  1976   TONSILLECTOMY     age 23   Social History   Socioeconomic History   Marital status: Divorced    Spouse name: Not on file   Number of children: 2   Years of education: Not on file   Highest education level: Not on file  Occupational History   Not on file  Tobacco Use   Smoking status: Former Smoker    Packs/day: 1.00    Years: 2.00    Pack years: 2.00    Types: Cigarettes   Smokeless tobacco: Never Used   Tobacco comment: quite smoker at age 77  Substance and Sexual Activity   Alcohol use: Yes    Alcohol/week: 0.0 - 1.0 standard drinks    Comment: 0-1 per month   Drug use: No   Sexual activity: Not Currently  Other Topics Concern   Not on file  Social History Narrative   Single.   Moved from [place] to [place].   Family lives in [place].   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   Social Determinants of Health   Financial Resource Strain:    Difficulty of Paying Living Expenses: Not on file  Food Insecurity:    Worried About Programme researcher, broadcasting/film/video in the Last Year: Not on file   [employer] of Food in the Last Year: Not on file  Transportation Needs:    Lack of Transportation  (Medical): Not on file   Lack of Transportation (Non-Medical): Not on file  Physical Activity:    Days of Exercise per Week: Not on file   Minutes of Exercise per Session: Not on file  Stress:  Feeling of Stress : Not on file  Social Connections:    Frequency of Communication with Friends and Family: Not on file   Frequency of Social Gatherings with Friends and Family: Not on file   Attends Religious Services: Not on file   Active Member of Clubs or Organizations: Not on file   Attends Banker Meetings: Not on file   Marital Status: Not on file  Intimate Partner Violence:    Fear of Current or Ex-Partner: Not on file   Emotionally Abused: Not on file   Physically Abused: Not on file   Sexually Abused: Not on file   Current Outpatient Medications on File Prior to Visit  Medication Sig Dispense Refill   albuterol (VENTOLIN HFA) 108 (90 Base) MCG/ACT inhaler INHALE 2 PUFFS BY MOUTH EVERY 6 HOURS AS NEEDED FOR WHEEZE OR SHORTNESS OF BREATH 16 g 1   anastrozole (ARIMIDEX) 1 MG tablet TAKE 1 TABLET BY MOUTH EVERY DAY 90 tablet 1   atorvastatin (LIPITOR) 20 MG tablet TAKE 1 TABLET BY MOUTH EVERY DAY FOR CHOLESTEROL. 90 tablet 3   BD PEN NEEDLE NANO 2ND GEN 32G X 4 MM MISC USE AS DIRECTED 3 TIMES A DAY 100 each 3   Continuous Blood Gluc Receiver (DEXCOM G6 RECEIVER) DEVI Use to monitor blood sugar 1 each 0   Continuous Blood Gluc Transmit (DEXCOM G6 TRANSMITTER) MISC 1 Device by Does not apply route every 3 (three) months. 1 each 3   Continuous Glucose Sensor (DEXCOM G6 SENSOR) MISC USE AS INSTRUCTED CHANGE SENSOR EVERY 10 DAYS E11.65 9 each 3   fluticasone (FLONASE) 50 MCG/ACT nasal spray INSTILL 1 SPRAY IN EACH NOSTRIL TWICE A DAY AS NEEDED FOR ALLERGIES OR RHINITIS 48 mL 0   fluticasone-salmeterol (WIXELA INHUB) 250-50 MCG/ACT AEPB INHALE 1 PUFF BY MOUTH TWICE A DAY 60 each 9   gabapentin (NEURONTIN) 100 MG capsule Take 1-3 capsules by mouth once to twice daily for pain. 90  capsule 0   insulin aspart (NOVOLOG) 100 UNIT/ML injection INJECT up to 70 UNITS INTO THE SKIN ONCE FOR 1 DOSE. VIA PUMP 90 mL 3   Insulin Disposable Pump (V-GO 20) 20 UNIT/24HR KIT USE 1X A DAY 1 kit 3   insulin lispro (HUMALOG KWIKPEN) 100 UNIT/ML KwikPen INJECT 25 UNITS INTO THE SKIN 3 (THREE) TIMES DAILY WITH MEALS 15 mL PRN   levothyroxine (SYNTHROID) 88 MCG tablet TAKE 1 AND 1/2 TABLET BY MOUTH EVERY SUNDAY. TAKE 1 TABLET BY MOUTH MON THROUGH SAT. TAKE ON AN EMPTY STOMACH WITH WATER ONLY. NO FOOD OR OTHER MEDICATIONS FOR 30 MINUTES. 96 tablet 0   metFORMIN (GLUCOPHAGE-XR) 500 MG 24 hr tablet TAKE 2 TABLETS (1,000 MG TOTAL) BY MOUTH DAILY WITH BREAKFAST. FOR DIABETES. 180 tablet 1   montelukast (SINGULAIR) 10 MG tablet TAKE 1 TABLET (10 MG TOTAL) BY MOUTH AT BEDTIME. FOR ALLERGIES AND ASTHMA. 90 tablet 0   ONETOUCH VERIO test strip USE AS DIRECTED TO CHECK FASTING BLOOD SUGARS THREE TIMES DAILY. INSULIN DEPENDENT DX CODE E11.9 300 strip 1   Semaglutide, 1 MG/DOSE, (OZEMPIC, 1 MG/DOSE,) 4 MG/3ML SOPN INJECT 1MG  INTO THE SKIN ONCE A WEEK 9 mL 4   No current facility-administered medications on file prior to visit.   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 She described it was a small amount in a milk carton in [place]. She states they  stopped using it shortly after because the trial wasn't successful. SPM   Prednisone     Make sugar jump way up.   Family History  Problem Relation Age of Onset   Alzheimer's disease Mother    Asthma Mother    Dementia Mother        cause of death   Arthritis Father    Heart attack Father 71       cause of death   Breast cancer Sister 56   Healthy Daughter    Colon cancer Paternal Grandmother    Liver cancer Maternal Uncle    Healthy Daughter    Healthy Brother    Healthy Sister    Colon polyps Neg Hx    Rectal cancer Neg Hx    Stomach  cancer Neg Hx    PE: BP 138/80   Pulse 78   Ht 5\' 5"  (1.651 m)   Wt 217 lb 9.6 oz (98.7 kg)   SpO2 99%   BMI 36.21 kg/m   Wt Readings from Last 3 Encounters:  11/18/22 217 lb 9.6 oz (98.7 kg)  11/11/22 217 lb (98.4 kg)  10/23/22 209 lb (94.8 kg)   Constitutional: overweight, in NAD Eyes:  EOMI, no exophthalmos ENT: no neck masses, no cervical lymphadenopathy Cardiovascular: RRR, No MRG Respiratory: CTA B Musculoskeletal: no deformities Skin:no rashes Neurological: no tremor with outstretched hands Diabetic Foot Exam - Simple   Simple Foot Form Diabetic Foot exam was performed with the following findings: Yes 11/18/2022  2:24 PM  Visual Inspection No deformities, no ulcerations, no other skin breakdown bilaterally: Yes Sensation Testing Intact to touch and monofilament testing bilaterally: Yes Pulse Check Posterior Tibialis and Dorsalis pulse intact bilaterally: Yes Comments    ASSESSMENT: 1. DM2, insulin-dependent, uncontrolled, without long-term complications, but with hyperglycemia  2. HL  3. Obesity class 1  PLAN:  1. Patient with longstanding, insulin-dependent type 2 diabetes, basal-bolus insulin regimen, and weekly GLP-1 receptor agonist and metformin, with still poor control and greatly fluctuating blood sugars. At last visit, sugars were improved overnight but they were increasing after meals, especially after lunch and dinner, most likely due to stress, URI, and coming off Ozempic.  We discussed about trying to get back on Ozempic as it was better available.  We also discussed about increasing her insulin before meals slightly.  At that time, HbA1c was not checked due to the virtual nature of the visit.  Previous HbA1c was 7.7%, improved. CGM interpretation: -At today's visit, we reviewed her CGM downloads: It appears that 64% of values are in target range (goal >70%), while 34% are higher than 180 (goal <25%), and 2% are lower than 70 (goal <4%).  The calculated  average blood sugar is 163.  The projected HbA1c for the next 3 months (GMI) is 7.2%. -Reviewing the CGM trends, sugars appear to be better, fluctuating mostly within the target range but with significant increases in blood sugars after meals.  They are quite high in the first half of the night but then improve with the lowest values around 6 AM. -Overall, sugars appear to be improved, but she does describe that sugars are increasing in the morning even without her eating or drinking.  This is consistent with not enough basal insulin and we discussed today's visit to try to increase her basal insulin by switching to VGo30 pump.  I explained that this will infuse 30 units of insulin over 24 hours.  We did discuss that if the sugars  start dropping after this, she may need to reduce her mealtime boluses by 1-2 clicks per meal.  Will continue the rest of the regimen for now. - I suggested to:  Patient Instructions  Please continue: - Metformin ER 1000 mg with breakfast - Ozempic 1 mg weekly  Change to: - VGo30: 6-7 clicks before b'fast and lunch; for dinner, take 7-8 clicks If sugars before meals <60, do not take Novolog before that meal. If sugars before meals <90, try to move the bolus right before the meal  Please return in 3-4 months.   - we checked her HbA1c: 7.4% (lower) - advised to check sugars at different times of the day - 4x a day, rotating check times - advised for yearly eye exams >> she is UTD - return to clinic in 3-4 months  2. HL -Reviewed latest lipid panel from 10/2021: Fractions at goal: Lab Results  Component Value Date   CHOL 187 10/31/2021   HDL 79.10 10/31/2021   LDLCALC 92 10/31/2021   TRIG 79.0 10/31/2021   CHOLHDL 2 10/31/2021  -She is on Lipitor 20 mg daily without side effects  3.  Obesity class II -At last visit I recommended to restart Ozempic, which should also help with weight loss -She was previously on a plant-based diet but she felt that this did not  help with her diabetes control so she is now on amnio p.o. diet. -She gained 11 pounds since the last in person visit  Carlus Pavlov, MD PhD [facility]

## 2022-11-18 NOTE — Patient Instructions (Addendum)
Please continue: - Metformin ER 1000 mg with breakfast - Ozempic 1 mg weekly  Change to: - VGo30: 6-7 clicks before b'fast and lunch; for dinner, take 7-8 clicks If sugars before meals <60, do not take Novolog before that meal. If sugars before meals <90, try to move the bolus right before the meal  Please return in 3-4 months.

## 2022-11-20 ENCOUNTER — Telehealth: Payer: Self-pay | Admitting: Primary Care

## 2022-11-20 ENCOUNTER — Ambulatory Visit (INDEPENDENT_AMBULATORY_CARE_PROVIDER_SITE_OTHER): Payer: Medicare Other | Admitting: Primary Care

## 2022-11-20 ENCOUNTER — Encounter: Payer: Self-pay | Admitting: Primary Care

## 2022-11-20 VITALS — BP 120/62 | HR 80 | Temp 97.0°F | Ht 65.0 in | Wt 215.0 lb

## 2022-11-20 DIAGNOSIS — Z794 Long term (current) use of insulin: Secondary | ICD-10-CM | POA: Diagnosis not present

## 2022-11-20 DIAGNOSIS — J302 Other seasonal allergic rhinitis: Secondary | ICD-10-CM | POA: Diagnosis not present

## 2022-11-20 DIAGNOSIS — E039 Hypothyroidism, unspecified: Secondary | ICD-10-CM | POA: Diagnosis not present

## 2022-11-20 DIAGNOSIS — E785 Hyperlipidemia, unspecified: Secondary | ICD-10-CM

## 2022-11-20 DIAGNOSIS — R42 Dizziness and giddiness: Secondary | ICD-10-CM

## 2022-11-20 DIAGNOSIS — M5441 Lumbago with sciatica, right side: Secondary | ICD-10-CM

## 2022-11-20 DIAGNOSIS — N6091 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: Secondary | ICD-10-CM

## 2022-11-20 DIAGNOSIS — G8929 Other chronic pain: Secondary | ICD-10-CM

## 2022-11-20 DIAGNOSIS — G5793 Unspecified mononeuropathy of bilateral lower limbs: Secondary | ICD-10-CM

## 2022-11-20 DIAGNOSIS — Z1211 Encounter for screening for malignant neoplasm of colon: Secondary | ICD-10-CM

## 2022-11-20 DIAGNOSIS — E1165 Type 2 diabetes mellitus with hyperglycemia: Secondary | ICD-10-CM | POA: Diagnosis not present

## 2022-11-20 DIAGNOSIS — J4520 Mild intermittent asthma, uncomplicated: Secondary | ICD-10-CM

## 2022-11-20 DIAGNOSIS — H81399 Other peripheral vertigo, unspecified ear: Secondary | ICD-10-CM

## 2022-11-20 LAB — LIPID PANEL
Cholesterol: 182 mg/dL (ref 0–200)
HDL: 78.4 mg/dL (ref >39.00)
LDL Cholesterol: 85 mg/dL (ref 0–99)
NonHDL: 103.95
Total CHOL/HDL Ratio: 2
Triglycerides: 93 mg/dL (ref 0.0–149.0)
VLDL: 18.6 mg/dL (ref 0.0–40.0)

## 2022-11-20 LAB — COMPREHENSIVE METABOLIC PANEL
ALT: 9 U/L (ref 0–35)
AST: 10 U/L (ref 0–37)
Albumin: 3.8 g/dL (ref 3.5–5.2)
Alkaline Phosphatase: 105 U/L (ref 39–117)
BUN: 17 mg/dL (ref 6–23)
CO2: 29 meq/L (ref 19–32)
Calcium: 9.2 mg/dL (ref 8.4–10.5)
Chloride: 100 meq/L (ref 96–112)
Creatinine, Ser: 0.9 mg/dL (ref 0.40–1.20)
GFR: 62.84 mL/min (ref >60.00)
Glucose, Bld: 279 mg/dL — ABNORMAL HIGH (ref 70–99)
Potassium: 4.5 meq/L (ref 3.5–5.1)
Sodium: 136 meq/L (ref 135–145)
Total Bilirubin: 0.4 mg/dL (ref 0.2–1.2)
Total Protein: 6.2 g/dL (ref 6.0–8.3)

## 2022-11-20 LAB — MICROALBUMIN / CREATININE URINE RATIO
Creatinine,U: 154.1 mg/dL
Microalb Creat Ratio: 0.5 mg/g (ref 0.0–30.0)
Microalb, Ur: 0.8 mg/dL (ref 0.0–1.9)

## 2022-11-20 LAB — TSH: TSH: 4.82 u[IU]/mL (ref 0.35–5.50)

## 2022-11-20 LAB — T4, FREE: Free T4: 1.17 ng/dL (ref 0.60–1.60)

## 2022-11-20 MED ORDER — MECLIZINE HCL 12.5 MG PO TABS
12.5000 mg | ORAL_TABLET | Freq: Three times a day (TID) | ORAL | 0 refills | Status: DC | PRN
Start: 2022-11-20 — End: 2023-03-24

## 2022-11-20 NOTE — Assessment & Plan Note (Signed)
Controlled.  Continue Singulair 10 mg HS. 

## 2022-11-20 NOTE — Assessment & Plan Note (Signed)
She is taking levothyroxine correctly. Continue levothyroxine 88 mcg. Repeat TSH pending.

## 2022-11-20 NOTE — Patient Instructions (Signed)
You may take meclizine 12.5 mg tablets every 8 hours as needed for dizziness.  This may cause drowsiness.  Stop by the lab prior to leaving today. I will notify you of your results once received.   You will either be contacted via phone regarding your referral to ENT and GI, or you may receive a letter on your MyChart portal from our referral team with instructions for scheduling an appointment. Please let us know if you have not been contacted by anyone within two weeks.  It was a pleasure to see you today!

## 2022-11-20 NOTE — Assessment & Plan Note (Signed)
Improving.  Following with endocrinology, office notes reviewed from September 2024.  Continue metformin XR 1000 mg daily, Ozempic 1 mg weekly, insulin pump as prescribed. Urine microalbumin pending.

## 2022-11-20 NOTE — Assessment & Plan Note (Signed)
Stable. ? ?No concerns today. ?

## 2022-11-20 NOTE — Assessment & Plan Note (Signed)
 Repeat lipid panel pending.  Continue atorvastatin 20 mg daily. 

## 2022-11-20 NOTE — Assessment & Plan Note (Signed)
Neuroexam unremarkable.  Symptoms suggestive of vertigo/inner ear.  Referral placed to ENT. Labs pending. Start meclizine 12.5 mg 3 times daily as needed.  Drowsiness precautions provided.

## 2022-11-20 NOTE — Progress Notes (Signed)
Subjective:    Patient ID: Molly Wall, female    DOB: September 21, 1945, 77 y.o.   MRN: [id number]  HPI  Molly Wall is a very pleasant 77 y.o. female with a history of asthma, type 2 diabetes, hypothyroidism, chronic back pain, atypical ductal hyperplasia of right breast, who presents today for follow-up of chronic conditions.  1) Asthma: Currently managed on albuterol inhaler as needed, Wixela 250-50 mcg 1 puff twice daily, montelukast 10 mg at bedtime.  She uses her albuterol inhaler sparingly. She denies wheezing, chest pain, shortness of breath.   2) Type 2 Diabetes: Following with endocrinology and is managed on semaglutide 1 mg weekly, metformin XR 1000 mg daily, insulin pump.  Her last A1c was 7.4, 2 days ago.  She was off of Ozempic for a few months due to stock supply, has now resumed.  Also managed on gabapentin 100 mg as needed for neuropathic pain.   3) Hypothyroidism: Currently managed on levothyroxine 88 mcg tablets.  She is taking levothyroxine every morning on an empty stomach with water only.   No food or other medications for 30 minutes.   No heartburn medication, iron pills, calcium, vitamin D, or magnesium pills within four hours of taking levothyroxine.   4) Hyperlipidemia: Currently managed on atorvastatin 20 labs daily.  She is due for repeat lipid panel today.  5) Dizziness: Chronic for years. Dizziness occurs intermittently about 4 months out of the year. She recently was at a water park, rode a water slide, was submerged under water for about 40 seconds. Since then she's noticed intermittent dizziness has returned. She describes her dizziness like "things are moving or my equilibrium is off". She's not seen ENT in the past. She uses Flonase daily. She has not tried meclizine.   Immunizations:  -Influenza: Declines  -Shingles: Completed Zostavax  -Pneumonia: Completed Prevnar 13 in 2020, pneumovax in 2016  Mammogram: Completed in  April 2024, MRI pending. Bone Density Scan: Scheduled for October 2024  Colonoscopy: Completed in 2019, due now.   BP Readings from Last 3 Encounters:  11/20/22 120/62  11/18/22 138/80  11/11/22 128/67     Review of Systems  Constitutional:  Negative for unexpected weight change.  HENT:  Negative for rhinorrhea.   Respiratory:  Negative for cough and shortness of breath.   Cardiovascular:  Negative for chest pain.  Gastrointestinal:  Negative for constipation and diarrhea.  Genitourinary:  Negative for difficulty urinating.  Musculoskeletal:  Positive for arthralgias and back pain.  Skin:  Negative for rash.  Allergic/Immunologic: Positive for environmental allergies.  Neurological:  Positive for dizziness. Negative for headaches.  Psychiatric/Behavioral:  The patient is not nervous/anxious.          Past Medical History:  Diagnosis Date   Adventitious breath sounds 11/20/2021   Allergic rhinitis    Allergy    Asthma    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   Breast cancer (HCC)    Diabetes mellitus without complication (HCC)    Family history of breast cancer    Family history of colon cancer    Hypercholesteremia    Hypothyroidism    Lumbar herniated disc    OSA (obstructive sleep apnea)    used to use CPAP, not any more over 1 1/2 yrs   Salivary stone    left side   Sleep apnea    no cpap in 2 yrs    Vitamin D deficiency     Social  History   Socioeconomic History   Marital status: Divorced    Spouse name: Not on file   Number of children: 2   Years of education: Not on file   Highest education level: Not on file  Occupational History   Not on file  Tobacco Use   Smoking status: Never    Passive exposure: Never   Smokeless tobacco: Never  Vaping Use   Vaping status: Never Used  Substance and Sexual Activity   Alcohol use: Yes    Alcohol/week: 0.0 - 1.0 standard drinks of alcohol    Comment: rarely   Drug use: No   Sexual  activity: Not Currently  Other Topics Concern   Not on file  Social History Narrative   Single.   Moved from [place] to [place] several weeks ago.   Family lives in [place].   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   Social Determinants of Health   Financial Resource Strain: Low Risk  (10/23/2022)   Overall Financial Resource Strain (CARDIA)    Difficulty of Paying Living Expenses: Not hard at all  Food Insecurity: No Food Insecurity (10/23/2022)   Hunger Vital Sign    Worried About Running Out of Food in the Last Year: Never true    Ran Out of Food in the Last Year: Never true  Transportation Needs: No Transportation Needs (10/23/2022)   PRAPARE - Administrator, Civil Service (Medical): No    Lack of Transportation (Non-Medical): No  Physical Activity: Inactive (10/23/2022)   Exercise Vital Sign    Days of Exercise per Week: 0 days    Minutes of Exercise per Session: 0 min  Stress: No Stress Concern Present (10/23/2022)   [employer] of Occupational Health - Occupational Stress Questionnaire    Feeling of Stress : Not at all  Social Connections: Moderately Integrated (10/23/2022)   Social Connection and Isolation Panel [NHANES]    Frequency of Communication with Friends and Family: More than three times a week    Frequency of Social Gatherings with Friends and Family: More than three times a week    Attends Religious Services: More than 4 times per year    Active Member of [employer] or Organizations: Yes    Attends Banker Meetings: 1 to 4 times per year    Marital Status: Divorced  Intimate Partner Violence: Not At Risk (10/23/2022)   Humiliation, Afraid, Rape, and Kick questionnaire    Fear of Current or Ex-Partner: No    Emotionally Abused: No    Physically Abused: No    Sexually Abused: No    Past Surgical History:  Procedure Laterality Date   APPENDECTOMY     BREAST  LUMPECTOMY Right    per pt - precancerous   BREAST LUMPECTOMY WITH RADIOACTIVE SEED LOCALIZATION Right 08/12/2017   Procedure: RIGHT BREAST LUMPECTOMY X'S 2 WITH RADIOACTIVE SEED LOCALIZATION X'S 2;  Surgeon: Claud Kelp, MD;  Location: [facility];  Service: General;  Laterality: Right;   COLONOSCOPY     11 yrs ago in [place] - polyps but given a 10 yr recall per pt    GANGLION CYST EXCISION     77 years old   INCISION AND DRAINAGE ABSCESS ANAL     KNEE SURGERY  2014   SHOULDER SURGERY  1976   TONSILLECTOMY     age 36    Family History  Problem Relation Age of Onset   Alzheimer's disease Mother    Asthma Mother    Dementia Mother        cause of death   Arthritis Father    Heart attack Father 78       cause of death   Breast cancer Sister 82   Healthy Daughter    Colon cancer Paternal Grandmother    Liver cancer Maternal Uncle    Healthy Daughter    Healthy Brother    Healthy Sister    Colon polyps Neg Hx    Rectal cancer Neg Hx    Stomach cancer Neg Hx     Allergies  Allergen Reactions   Iodine-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 She described it was a small amount in a milk carton in [place]. She states they stopped using it shortly after because the trial wasn't successful. SPM   Prednisone     Make sugar jump way up.    Current Outpatient Medications on File Prior to Visit  Medication Sig Dispense Refill   albuterol (VENTOLIN HFA) 108 (90 Base) MCG/ACT inhaler INHALE 2 PUFFS BY MOUTH EVERY 6 HOURS AS NEEDED FOR WHEEZE OR SHORTNESS OF BREATH 16 g 1   anastrozole (ARIMIDEX) 1 MG tablet TAKE 1 TABLET BY MOUTH EVERY DAY 90 tablet 1   atorvastatin (LIPITOR) 20 MG tablet TAKE 1 TABLET BY MOUTH EVERY DAY FOR CHOLESTEROL. 90 tablet 3   BD PEN NEEDLE NANO 2ND GEN 32G X 4 MM MISC USE AS DIRECTED 3 TIMES A DAY 100 each 3   Continuous Blood Gluc Receiver (DEXCOM G6 RECEIVER) DEVI  Use to monitor blood sugar 1 each 0   Continuous Blood Gluc Transmit (DEXCOM G6 TRANSMITTER) MISC 1 Device by Does not apply route every 3 (three) months. 1 each 3   Continuous Glucose Sensor (DEXCOM G6 SENSOR) MISC USE AS INSTRUCTED CHANGE SENSOR EVERY 10 DAYS E11.65 9 each 3   fluticasone (FLONASE) 50 MCG/ACT nasal spray INSTILL 1 SPRAY IN EACH NOSTRIL TWICE A DAY AS NEEDED FOR ALLERGIES OR RHINITIS 48 mL 0   fluticasone-salmeterol (WIXELA INHUB) 250-50 MCG/ACT AEPB INHALE 1 PUFF BY MOUTH TWICE A DAY 60 each 9   gabapentin (NEURONTIN) 100 MG capsule Take 1-3 capsules by mouth once to twice daily for pain. 90 capsule 0   Insulin Disposable Pump (V-GO 30) 30 UNIT/24HR KIT 1 each by Does not apply route daily. 30 kit 5   insulin lispro (HUMALOG KWIKPEN) 100 UNIT/ML KwikPen INJECT 25 UNITS INTO THE SKIN 3 (THREE) TIMES DAILY WITH MEALS 15 mL PRN   levothyroxine (SYNTHROID) 88 MCG tablet TAKE 1 AND 1/2 TABLET BY MOUTH EVERY SUNDAY. TAKE 1 TABLET BY MOUTH MON THROUGH SAT. TAKE ON AN EMPTY STOMACH WITH WATER ONLY. NO FOOD OR OTHER MEDICATIONS FOR 30 MINUTES. 96 tablet 0   metFORMIN (GLUCOPHAGE-XR) 500 MG 24 hr tablet TAKE 2 TABLETS (1,000 MG TOTAL) BY MOUTH DAILY WITH BREAKFAST. FOR DIABETES. 180 tablet 1   montelukast (SINGULAIR) 10 MG tablet TAKE 1 TABLET (10 MG TOTAL) BY MOUTH AT BEDTIME. FOR ALLERGIES AND ASTHMA. 90 tablet 0   ONETOUCH VERIO test strip USE AS DIRECTED TO CHECK FASTING BLOOD SUGARS THREE TIMES DAILY. INSULIN DEPENDENT DX CODE E11.9 300 strip 1   Semaglutide, 1 MG/DOSE, (OZEMPIC, 1 MG/DOSE,) 4 MG/3ML SOPN INJECT 1MG  INTO THE SKIN ONCE A WEEK 9 mL 4   insulin aspart (  NOVOLOG) 100 UNIT/ML injection INJECT up to 70 UNITS INTO THE SKIN ONCE FOR 1 DOSE. VIA PUMP (Patient not taking: Reported on 11/20/2022) 90 mL 3   No current facility-administered medications on file prior to visit.    BP 120/62   Pulse 80   Temp (!) 97 F (36.1 C) (Temporal)   Ht 5\' 5"  (1.651 m)   Wt 215 lb (97.5 kg)    SpO2 98%   BMI 35.78 kg/m  Objective:   Physical Exam HENT:     Right Ear: Tympanic membrane and ear canal normal.     Left Ear: Tympanic membrane and ear canal normal.     Nose: Nose normal.  Eyes:     Extraocular Movements: Extraocular movements intact.     Conjunctiva/sclera: Conjunctivae normal.     Pupils: Pupils are equal, round, and reactive to light.  Neck:     Thyroid: No thyromegaly.  Cardiovascular:     Rate and Rhythm: Normal rate and regular rhythm.     Heart sounds: No murmur heard. Pulmonary:     Effort: Pulmonary effort is normal.     Breath sounds: Normal breath sounds. No rales.  Abdominal:     General: Bowel sounds are normal.     Palpations: Abdomen is soft.     Tenderness: There is no abdominal tenderness.  Musculoskeletal:        General: Normal range of motion.     Cervical back: Neck supple.  Lymphadenopathy:     Cervical: No cervical adenopathy.  Skin:    General: Skin is warm and dry.     Findings: No rash.  Neurological:     Mental Status: She is alert and oriented to person, place, and time.     Cranial Nerves: No cranial nerve deficit.     Deep Tendon Reflexes: Reflexes are normal and symmetric.  Psychiatric:        Mood and Affect: Mood normal.           Assessment & Plan:  Dizziness Assessment & Plan: Neuroexam unremarkable.  Symptoms suggestive of vertigo/inner ear.  Referral placed to ENT. Labs pending. Start meclizine 12.5 mg 3 times daily as needed.  Drowsiness precautions provided.  Orders: -     Ambulatory referral to ENT -     Meclizine HCl; Take 1 tablet (12.5 mg total) by mouth 3 (three) times daily as needed for dizziness.  Dispense: 30 tablet; Refill: 0  Screening for colon cancer -     Ambulatory referral to Gastroenterology  Mild intermittent asthma without complication Assessment & Plan: Controlled.  Continue Wixhela 250-50 mcg, 1 puff BID. Continue albuterol inhaler PRN Continue Singulair 10 mg  HS.   Seasonal allergic rhinitis, unspecified trigger Assessment & Plan: Controlled.  Continue Singulair 10 mg HS   Hypothyroidism, unspecified type Assessment & Plan: She is taking levothyroxine correctly. Continue levothyroxine 88 mcg.  Repeat TSH pending.  Orders: -     TSH -     T4, free  Type 2 diabetes mellitus with hyperglycemia, with long-term current use of insulin (HCC) Assessment & Plan: Improving.  Following with endocrinology, office notes reviewed from September 2024.  Continue metformin XR 1000 mg daily, Ozempic 1 mg weekly, insulin pump as prescribed. Urine microalbumin pending.  Orders: -     Microalbumin / creatinine urine ratio -     Comprehensive metabolic panel  Chronic low back pain (Secondary area of Pain) (Bilateral) (R>L) Assessment & Plan: Stable. No concerns today.  Atypical ductal hyperplasia of right breast Assessment & Plan: Following with oncology. Reviewed office notes from August 2024.  Continue anastrozole 1 mg daily, will complete in October. Proceed with breast MRI in October as scheduled. Mammogram up-to-date.   Hyperlipidemia, unspecified hyperlipidemia type Assessment & Plan: Repeat lipid panel pending. Continue atorvastatin 20 mg daily.  Orders: -     Lipid panel  Neuropathic pain of both feet Assessment & Plan: Stable.  Continue tart cherry juice. Continue gabapentin 100 mg as needed.         Doreene Nest, NP

## 2022-11-20 NOTE — Assessment & Plan Note (Signed)
Controlled.  Continue Wixhela 250-50 mcg, 1 puff BID. Continue albuterol inhaler PRN Continue Singulair 10 mg HS.

## 2022-11-20 NOTE — Telephone Encounter (Signed)
Please call patient:  Notify her that the ENT (ear nose throat) office will not accept her referral for the dizziness.  They recommend cardiology or neurology evaluation.  I am happy to refer her to neurology.  I would also be reasonable to obtain a CT scan of her head just to make sure there are no masses within the brain contributing to her dizziness.  Let me know if she would like to see neurology. Let me know if she would be interested in the CT scan of her head.

## 2022-11-20 NOTE — Assessment & Plan Note (Signed)
Stable.  Continue tart cherry juice. Continue gabapentin 100 mg as needed.

## 2022-11-20 NOTE — Assessment & Plan Note (Signed)
Following with oncology. Reviewed office notes from August 2024.  Continue anastrozole 1 mg daily, will complete in October. Proceed with breast MRI in October as scheduled. Mammogram up-to-date.

## 2022-11-21 NOTE — Telephone Encounter (Signed)
Called patient and reviewed all information. Patient verbalized understanding.  She is agreeable to the neurology referral and CT scan.  Advised she will be contacted to schedule both.

## 2022-11-21 NOTE — Telephone Encounter (Signed)
Noted. Orders placed.

## 2022-11-24 ENCOUNTER — Encounter: Payer: Self-pay | Admitting: Gastroenterology

## 2022-11-28 NOTE — Progress Notes (Deleted)
Office Visit Note  Patient: Molly Wall             Date of Birth: 08-11-1945           MRN: [id number]             PCP: Doreene Nest, NP Referring: Doreene Nest, NP Visit Date: 12/11/2022 Occupation: @GUAROCC @  Subjective:  No chief complaint on file.   History of Present Illness: Molly Wall is a 77 y.o. female ***     Activities of Daily Living:  Patient reports morning stiffness for *** {minute/hour:19697}.   Patient {ACTIONS;DENIES/REPORTS:21021675::"Denies"} nocturnal pain.  Difficulty dressing/grooming: {ACTIONS;DENIES/REPORTS:21021675::"Denies"} Difficulty climbing stairs: {ACTIONS;DENIES/REPORTS:21021675::"Denies"} Difficulty getting out of chair: {ACTIONS;DENIES/REPORTS:21021675::"Denies"} Difficulty using hands for taps, buttons, cutlery, and/or writing: {ACTIONS;DENIES/REPORTS:21021675::"Denies"}  No Rheumatology ROS completed.   PMFS History:  Patient Active Problem List   Diagnosis Date Noted   Dizziness 11/20/2022   Neuropathic pain of both feet 10/31/2021   GERD (gastroesophageal reflux disease) 04/25/2021   Vegan diet 02/21/2020   Word finding difficulty 02/21/2020   Arthralgia of both hands 11/29/2019   Seasonal allergic rhinitis 11/29/2019   Chronic knee pain (intermittent) (Right) 11/01/2018   Chronic pain syndrome 10/31/2018   History iodine allergy 09/28/2018    Class: History of   DDD (degenerative disc disease), lumbar 09/27/2018   Grade 1 Anterolisthesis of L5/S1 09/16/2018   Lumbar facet arthropathy 09/16/2018   Arthropathy of lumbosacral facet joint 09/16/2018   Sialadenitis 08/24/2018   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 11/06/2017   Family history of breast cancer    Family history of colon cancer    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   Constipation 04/13/2017   Chronic low back pain (Secondary area of Pain) (Bilateral) (R>L) 02/13/2016   Chronic lower  extremity pain (Primary Area of Pain) (Right) 02/13/2016   Chronic lumbar radicular pain (Right) 02/13/2016   Type 2 diabetes mellitus with hyperglycemia, with long-term current use of insulin (HCC) 07/18/2015   Vitamin D deficiency 07/18/2015   Hyperlipidemia 12/13/2014   Asthma 12/13/2014   Hypothyroidism 12/13/2014   Lumbar herniated disc 12/13/2014    Past Medical History:  Diagnosis Date   Adventitious breath sounds 11/20/2021   Allergic rhinitis    Allergy    Asthma    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   Breast cancer (HCC)    Diabetes mellitus without complication (HCC)    Family history of breast cancer    Family history of colon cancer    Hypercholesteremia    Hypothyroidism    Lumbar herniated disc    OSA (obstructive sleep apnea)    used to use CPAP, not any more over 1 1/2 yrs   Salivary stone    left side   Sleep apnea    no cpap in 2 yrs    Vitamin D deficiency     Family History  Problem Relation Age of Onset   Alzheimer's disease Mother    Asthma Mother    Dementia Mother        cause of death   Arthritis Father    Heart attack Father 59       cause of death   Breast cancer Sister 71   Healthy Daughter    Colon cancer Paternal Grandmother    Liver cancer Maternal Uncle    Healthy Daughter    Healthy Brother    Healthy Sister    Colon polyps Neg  Hx    Rectal cancer Neg Hx    Stomach cancer Neg Hx    Past Surgical History:  Procedure Laterality Date   APPENDECTOMY     BREAST LUMPECTOMY Right    per pt - precancerous   BREAST LUMPECTOMY WITH RADIOACTIVE SEED LOCALIZATION Right 08/12/2017   Procedure: RIGHT BREAST LUMPECTOMY X'S 2 WITH RADIOACTIVE SEED LOCALIZATION X'S 2;  Surgeon: Claud Kelp, MD;  Location: [facility];  Service: General;  Laterality: Right;   COLONOSCOPY     11 yrs ago in [place] - polyps but given a 10 yr recall per pt    GANGLION CYST EXCISION     77 years old   INCISION AND  DRAINAGE ABSCESS ANAL     KNEE SURGERY  2014   SHOULDER SURGERY  1976   TONSILLECTOMY     age 54   Social History   Social History Narrative   Single.   Moved from [place] to [place] several weeks ago.   Family lives in [place].   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   Immunization History  Administered Date(s) Administered   Fluad Quad(high Dose 65+) 12/01/2018, 11/29/2019   Influenza,inj,Quad PF,6+ Mos 01/19/2015, 12/28/2015, 12/25/2016, 12/09/2017   Pneumococcal Conjugate-13 12/01/2018   Pneumococcal Polysaccharide-23 03/17/2014   Zoster, Live 07/18/2015     Objective: Vital Signs: There were no vitals taken for this visit.   Physical Exam   Musculoskeletal Exam: ***  CDAI Exam: CDAI Score: -- Patient Global: --; Provider Global: -- Swollen: --; Tender: -- Joint Exam 12/11/2022   No joint exam has been documented for this visit   There is currently no information documented on the homunculus. Go to the Rheumatology activity and complete the homunculus joint exam.  Investigation: No additional findings.  Imaging: No results found.  Recent Labs: Lab Results  Component Value Date   WBC 5.0 01/23/2022   HGB 13.9 01/23/2022   PLT 222.0 01/23/2022   NA 136 11/20/2022   K 4.5 11/20/2022   CL 100 11/20/2022   CO2 29 11/20/2022   GLUCOSE 279 (H) 11/20/2022   BUN 17 11/20/2022   CREATININE 0.90 11/20/2022   BILITOT 0.4 11/20/2022   ALKPHOS 105 11/20/2022   AST 10 11/20/2022   ALT 9 11/20/2022   PROT 6.2 11/20/2022   ALBUMIN 3.8 11/20/2022   CALCIUM 9.2 11/20/2022   GFRAA >60 01/06/2019    Speciality Comments: No specialty comments available.  Procedures:  No procedures performed Allergies: Iodine-131 and Prednisone   Assessment / Plan:     Visit Diagnoses: No diagnosis found.  Orders: No orders of the defined types were placed in this encounter.  No orders of  the defined types were placed in this encounter.   Face-to-face time spent with patient was *** minutes. Greater than 50% of time was spent in counseling and coordination of care.  Follow-Up Instructions: No follow-ups on file.   Ellen Henri, CMA  Note - This record has been created using Animal nutritionist.  Chart creation errors have been sought, but may not always  have been located. Such creation errors do not reflect on  the standard of medical care.

## 2022-11-28 NOTE — Addendum Note (Signed)
Addended by: Pollie Meyer on: 11/28/2022 09:17 AM   Modules accepted: Orders

## 2022-12-02 ENCOUNTER — Ambulatory Visit
Admission: RE | Admit: 2022-12-02 | Discharge: 2022-12-02 | Disposition: A | Discharge status: Home / Self Care | Payer: Medicare Other | Source: Ambulatory Visit | Attending: Primary Care | Admitting: Primary Care

## 2022-12-02 DIAGNOSIS — R42 Dizziness and giddiness: Secondary | ICD-10-CM

## 2022-12-02 DIAGNOSIS — H81399 Other peripheral vertigo, unspecified ear: Secondary | ICD-10-CM

## 2022-12-04 ENCOUNTER — Other Ambulatory Visit: Payer: Self-pay | Admitting: Primary Care

## 2022-12-04 DIAGNOSIS — H938X3 Other specified disorders of ear, bilateral: Secondary | ICD-10-CM

## 2022-12-05 ENCOUNTER — Other Ambulatory Visit: Payer: Self-pay | Admitting: Primary Care

## 2022-12-05 DIAGNOSIS — J4520 Mild intermittent asthma, uncomplicated: Secondary | ICD-10-CM

## 2022-12-08 ENCOUNTER — Other Ambulatory Visit: Payer: Medicare Other

## 2022-12-09 ENCOUNTER — Telehealth: Payer: Self-pay

## 2022-12-09 NOTE — Telephone Encounter (Signed)
Molly Wall [birth year] [id number]   Dear Dr. Carlus Pavlov,     Dr. Myrtie Neither has scheduled the above individual for a colonoscopy at 11:30 am on Tuesday, 01/13/23.  Our records show that this patient is on insulin therapy via an insulin pump.  Our colonoscopy prep protocol requires that:   the patient must be on a clear liquid diet the entire day prior to the procedure date as well as the morning of the procedure  the patient must be NPO for 3 to 4 hours prior to the procedure   the patient must consume a PEG 3350 solution to prepare for the procedure.  Please advise [place] of any adjustments that need to be made to the patient's insulin pump therapy prior to the above procedure date.    Please route or fax your response to [phone] .  If you have any questions, please call me at [phone].  Thank you for your help with this matter.  Sincerely,  Chilton Greathouse, RN    Physician Recommendation:  _____________________________________________________________________  ______________________________________________________________________  ______________________________________________________________________  ______________________________________________________________________

## 2022-12-09 NOTE — Telephone Encounter (Signed)
Molly Wall, For patients on the pump, I usually recommend to continue the same basal rates, but not to bolus for meals unless they are eating regular meals and only do corrections if the sugars increase above 180.  They can resume taking boluses for meals when they start eating after the colonoscopy. Sincerely, Carlus Pavlov MD

## 2022-12-09 NOTE — Telephone Encounter (Signed)
[place],   In preparation for this patient PV can you please reach out to the prescribing MD for insulin pump instructions. PV is scheduled for 10/8 and colon with Dr. Myrtie Neither on Tuesday 01/13/23.   Thank you,  Previsit

## 2022-12-09 NOTE — Telephone Encounter (Signed)
New telephone encounter created to obtain insulin pump clearance from Dr. Carlus Pavlov with [facility].

## 2022-12-09 NOTE — Telephone Encounter (Signed)
[place],   In preparation for this patients PV can you please reach out to prescribing MD for insulin pump instructions. PV is 10/8 and colonoscopy with Dr. Myrtie Neither on 01/13/23  Thank you,  Previsit

## 2022-12-11 ENCOUNTER — Ambulatory Visit: Payer: Medicare Other | Admitting: Rheumatology

## 2022-12-11 DIAGNOSIS — M25561 Pain in right knee: Secondary | ICD-10-CM

## 2022-12-11 DIAGNOSIS — M47816 Spondylosis without myelopathy or radiculopathy, lumbar region: Secondary | ICD-10-CM

## 2022-12-11 DIAGNOSIS — Q667 Congenital pes cavus, unspecified foot: Secondary | ICD-10-CM

## 2022-12-11 DIAGNOSIS — N6091 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: Secondary | ICD-10-CM

## 2022-12-11 DIAGNOSIS — R768 Other specified abnormal immunological findings in serum: Secondary | ICD-10-CM

## 2022-12-11 DIAGNOSIS — Z8639 Personal history of other endocrine, nutritional and metabolic disease: Secondary | ICD-10-CM

## 2022-12-11 DIAGNOSIS — J4521 Mild intermittent asthma with (acute) exacerbation: Secondary | ICD-10-CM

## 2022-12-11 DIAGNOSIS — Z803 Family history of malignant neoplasm of breast: Secondary | ICD-10-CM

## 2022-12-11 DIAGNOSIS — Z794 Long term (current) use of insulin: Secondary | ICD-10-CM

## 2022-12-11 DIAGNOSIS — G894 Chronic pain syndrome: Secondary | ICD-10-CM

## 2022-12-11 DIAGNOSIS — E559 Vitamin D deficiency, unspecified: Secondary | ICD-10-CM

## 2022-12-11 DIAGNOSIS — Z800 Family history of malignant neoplasm of digestive organs: Secondary | ICD-10-CM

## 2022-12-11 DIAGNOSIS — M65311 Trigger thumb, right thumb: Secondary | ICD-10-CM

## 2022-12-11 DIAGNOSIS — M65312 Trigger thumb, left thumb: Secondary | ICD-10-CM

## 2022-12-11 DIAGNOSIS — M19042 Primary osteoarthritis, left hand: Secondary | ICD-10-CM

## 2022-12-11 DIAGNOSIS — M4310 Spondylolisthesis, site unspecified: Secondary | ICD-10-CM

## 2022-12-12 ENCOUNTER — Other Ambulatory Visit: Payer: Medicare Other
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
## 2022-12-16 ENCOUNTER — Ambulatory Visit: Payer: Medicare Other

## 2022-12-16 ENCOUNTER — Ambulatory Visit (INDEPENDENT_AMBULATORY_CARE_PROVIDER_SITE_OTHER): Payer: Medicare Other

## 2022-12-16 ENCOUNTER — Inpatient Hospital Stay: Admission: RE | Admit: 2022-12-16 | Payer: Medicare Other | Source: Ambulatory Visit

## 2022-12-16 ENCOUNTER — Ambulatory Visit: Payer: Medicare Other | Attending: Rheumatology | Admitting: Rheumatology

## 2022-12-16 ENCOUNTER — Encounter: Payer: Self-pay | Admitting: Rheumatology

## 2022-12-16 VITALS — BP 98/65 | HR 82 | Resp 14 | Ht 65.0 in | Wt 218.0 lb

## 2022-12-16 DIAGNOSIS — M19041 Primary osteoarthritis, right hand: Secondary | ICD-10-CM | POA: Diagnosis not present

## 2022-12-16 DIAGNOSIS — Z803 Family history of malignant neoplasm of breast: Secondary | ICD-10-CM | POA: Insufficient documentation

## 2022-12-16 DIAGNOSIS — Z8639 Personal history of other endocrine, nutritional and metabolic disease: Secondary | ICD-10-CM | POA: Diagnosis present

## 2022-12-16 DIAGNOSIS — M65331 Trigger finger, right middle finger: Secondary | ICD-10-CM | POA: Diagnosis present

## 2022-12-16 DIAGNOSIS — M65312 Trigger thumb, left thumb: Secondary | ICD-10-CM | POA: Diagnosis present

## 2022-12-16 DIAGNOSIS — N6091 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: Secondary | ICD-10-CM | POA: Diagnosis present

## 2022-12-16 DIAGNOSIS — M25562 Pain in left knee: Secondary | ICD-10-CM

## 2022-12-16 DIAGNOSIS — Z800 Family history of malignant neoplasm of digestive organs: Secondary | ICD-10-CM | POA: Diagnosis present

## 2022-12-16 DIAGNOSIS — M4310 Spondylolisthesis, site unspecified: Secondary | ICD-10-CM | POA: Diagnosis present

## 2022-12-16 DIAGNOSIS — J4521 Mild intermittent asthma with (acute) exacerbation: Secondary | ICD-10-CM | POA: Diagnosis present

## 2022-12-16 DIAGNOSIS — M65311 Trigger thumb, right thumb: Secondary | ICD-10-CM | POA: Insufficient documentation

## 2022-12-16 DIAGNOSIS — G894 Chronic pain syndrome: Secondary | ICD-10-CM | POA: Insufficient documentation

## 2022-12-16 DIAGNOSIS — E559 Vitamin D deficiency, unspecified: Secondary | ICD-10-CM | POA: Diagnosis present

## 2022-12-16 DIAGNOSIS — M47816 Spondylosis without myelopathy or radiculopathy, lumbar region: Secondary | ICD-10-CM | POA: Insufficient documentation

## 2022-12-16 DIAGNOSIS — E1165 Type 2 diabetes mellitus with hyperglycemia: Secondary | ICD-10-CM | POA: Diagnosis present

## 2022-12-16 DIAGNOSIS — M25561 Pain in right knee: Secondary | ICD-10-CM

## 2022-12-16 DIAGNOSIS — R768 Other specified abnormal immunological findings in serum: Secondary | ICD-10-CM | POA: Insufficient documentation

## 2022-12-16 DIAGNOSIS — M19042 Primary osteoarthritis, left hand: Secondary | ICD-10-CM | POA: Diagnosis present

## 2022-12-16 DIAGNOSIS — Q667 Congenital pes cavus, unspecified foot: Secondary | ICD-10-CM | POA: Insufficient documentation

## 2022-12-16 DIAGNOSIS — Z794 Long term (current) use of insulin: Secondary | ICD-10-CM | POA: Diagnosis present

## 2022-12-16 NOTE — Patient Instructions (Signed)
Hand Exercises Hand exercises can be helpful for almost anyone. They can strengthen your hands and improve flexibility and movement. The exercises can also increase blood flow to the hands. These results can make your work and daily tasks easier for you. Hand exercises can be especially helpful for people who have joint pain from arthritis or nerve damage from using their hands over and over. These exercises can also help people who injure a hand. Exercises Most of these hand exercises are gentle stretching and motion exercises. It is usually safe to do them often throughout the day. Warming up your hands before exercise may help reduce stiffness. You can do this with gentle massage or by placing your hands in warm water for 10-15 minutes. It is normal to feel some stretching, pulling, tightness, or mild discomfort when you begin new exercises. In time, this will improve. Remember to always be careful and stop right away if you feel sudden, very bad pain or your pain gets worse. You want to get better and be safe. Ask your health care provider which exercises are safe for you. Do exercises exactly as told by your provider and adjust them as told. Do not begin these exercises until told by your provider. Knuckle bend or "claw" fist  Stand or sit with your arm, hand, and all five fingers pointed straight up. Make sure to keep your wrist straight. Gently bend your fingers down toward your palm until the tips of your fingers are touching your palm. Keep your big knuckle straight and only bend the small knuckles in your fingers. Hold this position for 10 seconds. Straighten your fingers back to your starting position. Repeat this exercise 5-10 times with each hand. Full finger fist  Stand or sit with your arm, hand, and all five fingers pointed straight up. Make sure to keep your wrist straight. Gently bend your fingers into your palm until the tips of your fingers are touching the middle of your  palm. Hold this position for 10 seconds. Extend your fingers back to your starting position, stretching every joint fully. Repeat this exercise 5-10 times with each hand. Straight fist  Stand or sit with your arm, hand, and all five fingers pointed straight up. Make sure to keep your wrist straight. Gently bend your fingers at the big knuckle, where your fingers meet your hand, and at the middle knuckle. Keep the knuckle at the tips of your fingers straight and try to touch the bottom of your palm. Hold this position for 10 seconds. Extend your fingers back to your starting position, stretching every joint fully. Repeat this exercise 5-10 times with each hand. Tabletop  Stand or sit with your arm, hand, and all five fingers pointed straight up. Make sure to keep your wrist straight. Gently bend your fingers at the big knuckle, where your fingers meet your hand, as far down as you can. Keep the small knuckles in your fingers straight. Think of forming a tabletop with your fingers. Hold this position for 10 seconds. Extend your fingers back to your starting position, stretching every joint fully. Repeat this exercise 5-10 times with each hand. Finger spread  Place your hand flat on a table with your palm facing down. Make sure your wrist stays straight. Spread your fingers and thumb apart from each other as far as you can until you feel a gentle stretch. Hold this position for 10 seconds. Bring your fingers and thumb tight together again. Hold this position for 10 seconds. Repeat   this exercise 5-10 times with each hand. Making circles  Stand or sit with your arm, hand, and all five fingers pointed straight up. Make sure to keep your wrist straight. Make a circle by touching the tip of your thumb to the tip of your index finger. Hold for 10 seconds. Then open your hand wide. Repeat this motion with your thumb and each of your fingers. Repeat this exercise 5-10 times with each hand. Thumb  motion  Sit with your forearm resting on a table and your wrist straight. Your thumb should be facing up toward the ceiling. Keep your fingers relaxed as you move your thumb. Lift your thumb up as high as you can toward the ceiling. Hold for 10 seconds. Bend your thumb across your palm as far as you can, reaching the tip of your thumb for the small finger (pinkie) side of your palm. Hold for 10 seconds. Repeat this exercise 5-10 times with each hand. Grip strengthening  Hold a stress ball or other soft ball in the middle of your hand. Slowly increase the pressure, squeezing the ball as much as you can without causing pain. Think of bringing the tips of your fingers into the middle of your palm. All of your finger joints should bend when doing this exercise. Hold your squeeze for 10 seconds, then relax. Repeat this exercise 5-10 times with each hand. Contact a health care provider if: Your hand pain or discomfort gets much worse when you do an exercise. Your hand pain or discomfort does not improve within 2 hours after you exercise. If you have either of these problems, stop doing these exercises right away. Do not do them again unless your provider says that you can. Get help right away if: You develop sudden, severe hand pain or swelling. If this happens, stop doing these exercises right away. Do not do them again unless your provider says that you can. This information is not intended to replace advice given to you by your health care provider. Make sure you discuss any questions you have with your health care provider. Document Revised: 03/18/2022 Document Reviewed: 03/18/2022 Elsevier Patient Education  2024 [employer] Exercises for Chronic Knee Pain Chronic knee pain is pain that lasts longer than 3 months. For most people with chronic knee pain, exercise and weight loss is an important part of treatment. Your health care provider may want you to focus on: Making the muscles that  support your knee stronger. This can take pressure off your knee and reduce pain. Preventing knee stiffness. How far you can move your knee, keeping it there or making it farther. Losing weight (if this applies) to take pressure off your knee, lower your risk for injury, and make it easier for you to exercise. Your provider will help you make an exercise program that fits your needs and physical abilities. Below are simple, low-impact exercises you can do at home. Ask your provider or physical therapist how often you should do your exercise program and how many times to repeat each exercise. General safety tips  Get your provider's approval before doing any exercises. Start slowly and stop any time you feel pain. Do not exercise if your knee pain is flaring up. Warm up first. Stretching a cold muscle can cause an injury. Do 5-10 minutes of easy movement or light stretching before beginning your exercises. Do 5-10 minutes of low-impact activity (like walking or cycling) before starting strengthening exercises. Contact your provider any time you have pain during   or after exercising. Exercise can cause discomfort but should not be painful. It is normal to be a little stiff or sore after exercising. Stretching and range-of-motion exercises Front thigh stretch  Stand up straight and support your body by holding on to a chair or resting one hand on a wall. With your legs straight and close together, bend one knee to lift your heel up toward your butt. Using one hand for support, grab your ankle with your free hand. Pull your foot up closer toward your butt to feel the stretch in front of your thigh. Hold the stretch for 30 seconds. Repeat __________ times. Complete this exercise __________ times a day. Back thigh stretch  Sit on the floor with your back straight and your legs out straight in front of you. Place the palms of your hands on the floor and slide them toward your feet as you bend at the  hip. Try to touch your nose to your knees and feel the stretch in the back of your thighs. Hold for 30 seconds. Repeat __________ times. Complete this exercise __________ times a day. Calf stretch  Stand facing a wall. Place the palms of your hands flat against the wall, arms extended, and lean slightly against the wall. Get into a lunge position with one leg bent at the knee and the other leg stretched out straight behind you. Keep both feet facing the wall and increase the bend in your knee while keeping the heel of the other leg flat on the ground. You should feel the stretch in your calf. Hold for 30 seconds. Repeat __________ times. Complete this exercise __________ times a day. Strengthening exercises Straight leg lift  Lie on your back with one knee bent and the other leg out straight. Slowly lift the straight leg without bending the knee. Lift until your foot is about 12 inches (30 cm) off the floor. Hold for 3-5 seconds and slowly lower your leg. Repeat __________ times. Complete this exercise __________ times a day. Single leg dip  Stand between two chairs and put both hands on the backs of the chairs for support. Extend one leg out straight with your body weight resting on the heel of the standing leg. Slowly bend your standing knee to dip your body to the level that is comfortable for you. Hold for 3-5 seconds. Repeat __________ times. Complete this exercise __________ times a day. Hamstring curls  Stand straight, knees close together, facing the back of a chair. Hold on to the back of a chair with both hands. Keep one leg straight. Bend the other knee while bringing the heel up toward the butt until the knee is bent at a 90-degree angle (right angle). Hold for 3-5 seconds. Repeat __________ times. Complete this exercise __________ times a day. Wall squat  Stand straight with your back, hips, and head against a wall. Step forward one foot at a time with your back  still against the wall. Your feet should be 2 feet (61 cm) from the wall at shoulder width. Keeping your back, hips, and head against the wall, slide down the wall to as close to a sitting position as you can get. Hold for 5-10 seconds, then slowly slide back up. Repeat __________ times. Complete this exercise __________ times a day. Step-ups  Stand in front of a sturdy platform or stool that is about 6 inches (15 cm) high. Slowly step up with your left / right foot, keeping your knee in line with your hip   and foot. Do not let your knee bend so far that you cannot see your toes. Hold on to a chair for balance, but do not use it for support. Slowly unlock your knee and lower yourself to the starting position. Repeat __________ times. Complete this exercise __________ times a day. Contact a health care provider if: Your exercises cause pain. Your pain is worse after you exercise. Your pain prevents you from doing your exercises. This information is not intended to replace advice given to you by your health care provider. Make sure you discuss any questions you have with your health care provider. Document Revised: 03/18/2022 Document Reviewed: 03/18/2022 Elsevier Patient Education  2024 Elsevier Inc.  

## 2022-12-17 ENCOUNTER — Other Ambulatory Visit: Payer: Self-pay | Admitting: Primary Care

## 2022-12-17 ENCOUNTER — Other Ambulatory Visit: Payer: Self-pay | Admitting: Adult Health

## 2022-12-17 ENCOUNTER — Other Ambulatory Visit: Payer: Self-pay | Admitting: Family Medicine

## 2022-12-17 DIAGNOSIS — E785 Hyperlipidemia, unspecified: Secondary | ICD-10-CM

## 2022-12-17 DIAGNOSIS — J45909 Unspecified asthma, uncomplicated: Secondary | ICD-10-CM

## 2022-12-17 DIAGNOSIS — J4520 Mild intermittent asthma, uncomplicated: Secondary | ICD-10-CM

## 2022-12-17 DIAGNOSIS — E039 Hypothyroidism, unspecified: Secondary | ICD-10-CM

## 2022-12-21 ENCOUNTER — Other Ambulatory Visit: Payer: Self-pay | Admitting: Internal Medicine

## 2022-12-21 DIAGNOSIS — E119 Type 2 diabetes mellitus without complications: Secondary | ICD-10-CM

## 2022-12-23 ENCOUNTER — Encounter: Payer: Self-pay | Admitting: Gastroenterology

## 2022-12-23 ENCOUNTER — Ambulatory Visit (AMBULATORY_SURGERY_CENTER): Payer: Medicare Other

## 2022-12-23 VITALS — Ht 65.0 in | Wt 214.0 lb

## 2022-12-23 DIAGNOSIS — Z860100 Personal history of colon polyps, unspecified: Secondary | ICD-10-CM

## 2022-12-23 MED ORDER — NA SULFATE-K SULFATE-MG SULF 17.5-3.13-1.6 GM/177ML PO SOLN: 1.0000 | Freq: Once | ORAL | 0 refills | Status: AC

## 2022-12-23 NOTE — Progress Notes (Signed)
Pre visit completed via phone call; Patient verified name, DOB, and address; No egg or soy allergy known to patient;  No issues known to pt with past sedation with any surgeries or procedures; Patient denies ever being told they had issues or difficulty with intubation;  No FH of Malignant Hyperthermia; Pt is not on diet pills; Pt is not on home 02;  Pt is not on blood thinners;  Pt reports issues with constipation------------------------  No A fib or A flutter; Have any cardiac testing pending--NO Insurance verified during PV appt--- Medicare Pt can ambulate without assistance;  Pt denies use of chewing tobacco; Discussed diabetic/weight loss medication holds; Discussed NSAID holds; Checked BMI to be less than 50; Pt instructed to use Single[employer]com or GoodRx for a price reduction on prep;  Patient's chart reviewed by Cathlyn Parsons CNRA prior to previsit and patient appropriate for the [facility];  Pre visit completed and red dot placed by patient's name on their procedure day (on provider's schedule);    Instructions sent to MyChart per patient request;

## 2022-12-31 ENCOUNTER — Telehealth: Payer: Self-pay

## 2022-12-31 NOTE — Telephone Encounter (Signed)
Patient called stating she is going to have a colonoscopy on 01/13/23 and she is concerned about the fasting for 1 day since she does have Hypoglycemic episodes. She wants to know what should she do to keep her numbers up. Please advise,

## 2023-01-07 ENCOUNTER — Other Ambulatory Visit: Payer: Self-pay | Admitting: Primary Care

## 2023-01-07 DIAGNOSIS — J302 Other seasonal allergic rhinitis: Secondary | ICD-10-CM

## 2023-01-09 ENCOUNTER — Telehealth: Payer: Self-pay | Admitting: *Deleted

## 2023-01-09 ENCOUNTER — Ambulatory Visit
Admission: RE | Admit: 2023-01-09 | Discharge: 2023-01-09 | Disposition: A | Discharge status: Home / Self Care | Payer: Medicare Other | Source: Ambulatory Visit | Attending: Adult Health | Admitting: Adult Health

## 2023-01-09 DIAGNOSIS — N6091 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: Secondary | ICD-10-CM

## 2023-01-09 DIAGNOSIS — Z9189 Other specified personal risk factors, not elsewhere classified: Secondary | ICD-10-CM

## 2023-01-09 MED ORDER — GADOPICLENOL 0.5 MMOL/ML IV SOLN
9.0000 mL | Freq: Once | INTRAVENOUS | Status: AC | PRN
  Administered 2023-01-09: 9 mL via INTRAVENOUS

## 2023-01-09 NOTE — Telephone Encounter (Signed)
-----   Message from Noreene Filbert sent at 01/09/2023  1:50 PM EDT ----- Please let patient know MRI is negative. ----- Message ----- From: Interface, Rad Results In Sent: 01/09/2023  12:44 PM EDT To: Loa Socks, NP

## 2023-01-09 NOTE — Telephone Encounter (Signed)
RN placed call to pt with below information.  Pt verbalized understanding.

## 2023-01-13 ENCOUNTER — Ambulatory Visit (AMBULATORY_SURGERY_CENTER): Payer: Medicare Other | Admitting: Gastroenterology

## 2023-01-13 ENCOUNTER — Encounter: Payer: Self-pay | Admitting: Gastroenterology

## 2023-01-13 VITALS — BP 124/68 | HR 68 | Temp 97.1°F | Resp 13 | Ht 65.0 in | Wt 214.0 lb

## 2023-01-13 DIAGNOSIS — Z860100 Personal history of colon polyps, unspecified: Secondary | ICD-10-CM

## 2023-01-13 DIAGNOSIS — Z1211 Encounter for screening for malignant neoplasm of colon: Secondary | ICD-10-CM | POA: Diagnosis not present

## 2023-01-13 DIAGNOSIS — Z09 Encounter for follow-up examination after completed treatment for conditions other than malignant neoplasm: Secondary | ICD-10-CM

## 2023-01-13 MED ORDER — SODIUM CHLORIDE 0.9 % IV SOLN: 500.0000 mL | INTRAVENOUS | Status: DC

## 2023-01-13 NOTE — Patient Instructions (Addendum)
Molly Wall,   For the constipation you have been experiencing from your Ozempic, try taking a magnesium glycinate 240 mg capsule at bedtime.  If no improvement with that, try a quarter to half capful of miralax powder in a glass of liquid each day.  Can adjust dose as needed.  Hope that helps.  - Dr. Myrtie Neither  Resume previous diet Continue present medications  There were no colon polyps seen today!  You will NOT need another screening colonoscopy as these screenings end around the age of 18, however, please call us at [phone] if you have a change in bowel habits, change in family history of colo-rectal cancer, rectal bleeding or other GI concern in the future.  Handouts/information given for chronic constipation, diverticulosis and hemorrhoids  YOU HAD AN ENDOSCOPIC PROCEDURE TODAY AT THE [facility]:   Refer to the procedure report that was given to you for any specific questions about what was found during the examination.  If the procedure report does not answer your questions, please call your gastroenterologist to clarify.  If you requested that your care partner not be given the details of your procedure findings, then the procedure report has been included in a sealed envelope for you to review at your convenience later.  YOU SHOULD EXPECT: Some feelings of bloating in the abdomen. Passage of more gas than usual.  Walking can help get rid of the air that was put into your GI tract during the procedure and reduce the bloating. If you had a lower endoscopy (such as a colonoscopy or flexible sigmoidoscopy) you may notice spotting of blood in your stool or on the toilet paper. If you underwent a bowel prep for your procedure, you may not have a normal bowel movement for a few days.  Please Note:  You might notice some irritation and congestion in your nose or some drainage.  This is from the oxygen used during your procedure.  There is no need for concern and it should clear up in a  day or so.  SYMPTOMS TO REPORT IMMEDIATELY:  Following lower endoscopy (colonoscopy):  Excessive amounts of blood in the stool  Significant tenderness or worsening of abdominal pains  Swelling of the abdomen that is new, acute  Fever of 100F or higher For urgent or emergent issues, a gastroenterologist can be reached at any hour by calling [phone]. Do not use MyChart messaging for urgent concerns.   DIET:  We do recommend a small meal at first, but then you may proceed to your regular diet.  Drink plenty of fluids but you should avoid alcoholic beverages for 24 hours.  ACTIVITY:  You should plan to take it easy for the rest of today and you should NOT DRIVE or use heavy machinery until tomorrow (because of the sedation medicines used during the test).    FOLLOW UP: Our staff will call the number listed on your records the next business day following your procedure.  We will call around 7:15- 8:00 am to check on you and address any questions or concerns that you may have regarding the information given to you following your procedure. If we do not reach you, we will leave a message.     SIGNATURES/CONFIDENTIALITY: You and/or your care partner have signed paperwork which will be entered into your electronic medical record.  These signatures attest to the fact that that the information above on your After Visit Summary has been reviewed and is understood.  Full responsibility of  the confidentiality of this discharge information lies with you and/or your care-partner.  __________________________

## 2023-01-13 NOTE — Progress Notes (Signed)
Vss nad trans to pacu 

## 2023-01-13 NOTE — Op Note (Signed)
[facility] Patient Name: Molly Wall Procedure Date: 01/13/2023 10:44 AM MRN: [id number] Endoscopist: Sherilyn Cooter L. Myrtie Neither , MD, [id number] Age: 77 Referring MD:  Date of Birth: August 18, 1945 Gender: Female Account #: [id number] Procedure:                Colonoscopy Indications:              Surveillance: Personal history of adenomatous                            polyps on last colonoscopy 5 years ago                           Diminutive tubular adenoma July 2019 Medicines:                Monitored Anesthesia Care Procedure:                Pre-Anesthesia Assessment:                           - Prior to the procedure, a History and Physical                            was performed, and patient medications and                            allergies were reviewed. The patient's tolerance of                            previous anesthesia was also reviewed. The risks                            and benefits of the procedure and the sedation                            options and risks were discussed with the patient.                            All questions were answered, and informed consent                            was obtained. Prior Anticoagulants: The patient has                            taken no anticoagulant or antiplatelet agents. ASA                            Grade Assessment: III - A patient with severe                            systemic disease. After reviewing the risks and                            benefits, the patient was deemed in satisfactory  condition to undergo the procedure.                           After obtaining informed consent, the colonoscope                            was passed under direct vision. Throughout the                            procedure, the patient's blood pressure, pulse, and                            oxygen saturations were monitored continuously. The                            CF [id number] #[id number] was  introduced through the anus                            and advanced to the the cecum, identified by                            appendiceal orifice and ileocecal valve. The                            colonoscopy was performed without difficulty. The                            patient tolerated the procedure well. The quality                            of the bowel preparation was good. The ileocecal                            valve, appendiceal orifice, and rectum were                            photographed. Scope In: 11:20:47 AM Scope Out: 11:35:41 AM Scope Withdrawal Time: 0 hours 11 minutes 33 seconds  Total Procedure Duration: 0 hours 14 minutes 54 seconds  Findings:                 The perianal and digital rectal examinations were                            normal.                           Repeat examination of right colon under NBI                            performed.                           Multiple diverticula were found from transverse  colon to sigmoid colon.                           Internal hemorrhoids were found. The hemorrhoids                            were small and Grade I (internal hemorrhoids that                            do not prolapse).                           The exam was otherwise without abnormality on                            direct and retroflexion views. Complications:            No immediate complications. Estimated Blood Loss:     Estimated blood loss: none. Impression:               - Diverticulosis from transverse colon to sigmoid                            colon.                           - Internal hemorrhoids.                           - The examination was otherwise normal on direct                            and retroflexion views.                           - No specimens collected. Recommendation:           - Patient has a contact number available for                            emergencies. The signs and  symptoms of potential                            delayed complications were discussed with the                            patient. Return to normal activities tomorrow.                            Written discharge instructions were provided to the                            patient.                           - Resume previous diet.                           -  Continue present medications.                           - No repeat surveillance colonoscopy due to age,                            current guidelines and no polyps today. Kacey Vicuna L. Myrtie Neither, MD 01/13/2023 11:39:37 AM This report has been signed electronically.

## 2023-01-13 NOTE — Progress Notes (Signed)
History and Physical:  This patient presents for endoscopic testing for: Encounter Diagnosis  Name Primary?   History of colonic polyps Yes    Surveillance colonoscopy today Diminutive tubular adenoma July 2019 Patient denies chronic abdominal pain, rectal bleeding, or diarrhea. Constipation since starting GLP-1 agonist medicine   Patient is otherwise without complaints or active issues today.   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   Breast cancer (HCC)    Diabetes mellitus without complication (HCC)    Family history of breast cancer    Family history of colon cancer    Hypercholesteremia    Hypothyroidism    Lumbar herniated disc    OSA (obstructive sleep apnea)    used to use CPAP, not any more over 1 1/2 yrs   Osteoarthritis    Salivary stone    left side   Sleep apnea    no cpap in 2 yrs    Vitamin D deficiency      Past Surgical History: Past Surgical History:  Procedure Laterality Date   APPENDECTOMY     BREAST LUMPECTOMY Right    per pt - precancerous   BREAST LUMPECTOMY WITH RADIOACTIVE SEED LOCALIZATION Right 08/12/2017   Procedure: RIGHT BREAST LUMPECTOMY X'S 2 WITH RADIOACTIVE SEED LOCALIZATION X'S 2;  Surgeon: Claud Kelp, MD;  Location: [facility];  Service: General;  Laterality: Right;   COLONOSCOPY     11 yrs ago in [place] - polyps but given a 10 yr recall per pt    GANGLION CYST EXCISION     77 years old   INCISION AND DRAINAGE ABSCESS ANAL     KNEE SURGERY  2014   SHOULDER SURGERY  1976   TONSILLECTOMY     age 77    Allergies: Allergies  Allergen Reactions   Iodine-131 Anaphylaxis    Pt states when she was 21=she drank an experimental Iodinated Radioisotope for her thyroid. She described it was a small amount in a milk  carton in [place]. She states they stopped using it shortly after because the trial wasn't successful. SPM   Prednisone     Make sugar jump way up.    Outpatient Meds: Current Outpatient Medications  Medication Sig Dispense Refill   anastrozole (ARIMIDEX) 1 MG tablet TAKE 1 TABLET BY MOUTH EVERY DAY 90 tablet 1   atorvastatin (LIPITOR) 20 MG tablet TAKE 1 TABLET BY MOUTH EVERY DAY FOR CHOLESTEROL 90 tablet 2   BD PEN NEEDLE NANO 2ND GEN 32G X 4 MM MISC USE AS DIRECTED 3 TIMES A DAY 100 each 3   Continuous Blood Gluc Receiver (DEXCOM G6 RECEIVER) DEVI Use to monitor blood sugar 1 each 0   Continuous Blood Gluc Transmit (DEXCOM G6 TRANSMITTER) MISC 1 Device by Does not apply route every 3 (three) months. 1 each 3   Continuous Glucose Sensor (DEXCOM G6 SENSOR) MISC USE AS INSTRUCTED CHANGE SENSOR EVERY 10 DAYS E11.65 9 each 3   fluticasone (FLONASE) 50 MCG/ACT nasal spray INSTILL 1 SPRAY IN EACH NOSTRIL TWICE A DAY AS NEEDED FOR ALLERGIES OR RHINITIS 48 mL 2   gabapentin (NEURONTIN) 100 MG capsule Take 1-3 capsules by mouth once to twice daily for pain. 90 capsule 0   insulin aspart (NOVOLOG) 100 UNIT/ML injection INJECT  up to 70 UNITS INTO THE SKIN ONCE FOR 1 DOSE. VIA PUMP 90 mL 3   Insulin Disposable Pump (V-GO 30) 30 UNIT/24HR KIT 1 each by Does not apply route daily. 30 kit 5   insulin lispro (HUMALOG KWIKPEN) 100 UNIT/ML KwikPen INJECT 25 UNITS INTO THE SKIN 3 (THREE) TIMES DAILY WITH MEALS 15 mL PRN   levothyroxine (SYNTHROID) 88 MCG tablet TAKE 1 AND 1/2 TABLET BY MOUTH EVERY SUNDAY. TAKE 1 TABLET BY MOUTH MON THROUGH SAT. TAKE ON AN EMPTY STOMACH WITH WATER ONLY. NO FOOD OR OTHER MEDICATIONS FOR 30 MINUTES. 96 tablet 2   metFORMIN (GLUCOPHAGE-XR) 500 MG 24 hr tablet TAKE 2 TABLETS (1,000 MG TOTAL) BY MOUTH DAILY WITH BREAKFAST. FOR DIABETES. 180 tablet 1   montelukast (SINGULAIR) 10 MG tablet TAKE 1 TAB BY MOUTH AT BEDTIME FOR ALLERGIES AND ASTHMA. 90 tablet 2   ONETOUCH VERIO test  strip USE AS DIRECTED TO CHECK FASTING BLOOD SUGARS THREE TIMES DAILY. 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     WIXELA INHUB 250-50 MCG/ACT AEPB INHALE 1 PUFF BY MOUTH TWICE A DAY (Patient taking differently: Inhale 1 puff into the lungs daily.) 60 each 9   albuterol (VENTOLIN HFA) 108 (90 Base) MCG/ACT inhaler INHALE 2 PUFFS BY MOUTH EVERY 6 HOURS AS NEEDED FOR WHEEZE OR SHORTNESS OF BREATH 18 each 1   meclizine (ANTIVERT) 12.5 MG tablet Take 1 tablet (12.5 mg total) by mouth 3 (three) times daily as needed for dizziness. 30 tablet 0   Semaglutide, 1 MG/DOSE, (OZEMPIC, 1 MG/DOSE,) 4 MG/3ML SOPN INJECT 1MG  INTO THE SKIN ONCE A WEEK 9 mL 4   Current Facility-Administered Medications  Medication Dose Route Frequency Provider Last Rate Last Admin   0.9 %  sodium chloride infusion  500 mL Intravenous Continuous Danis, Starr Lake III, MD          ___________________________________________________________________ Objective   Exam:  BP 129/64   Pulse 74   Temp (!) 97.1 F (36.2 C)   Ht 5\' 5"  (1.651 m)   Wt 214 lb (97.1 kg)   SpO2 97%   BMI 35.61 kg/m   CV: regular , S1/S2 Resp: clear to auscultation bilaterally, normal RR and effort noted GI: soft, no tenderness, with active bowel sounds.   Assessment: Encounter Diagnosis  Name Primary?   History of colonic polyps Yes     Plan: Colonoscopy   The benefits and risks of the planned procedure were described in detail with the patient or (when appropriate) their health care proxy.  Risks were outlined as including, but not limited to, bleeding, infection, perforation, adverse medication reaction leading to cardiac or pulmonary decompensation, pancreatitis (if ERCP).  The limitation of incomplete mucosal visualization was also discussed.  No guarantees or warranties were given.  The patient is appropriate for an endoscopic procedure in the ambulatory setting.   - Amada Jupiter, MD

## 2023-01-13 NOTE — Progress Notes (Signed)
VS by DT  Pt's states no medical or surgical changes since previsit or office visit.  

## 2023-01-14 ENCOUNTER — Other Ambulatory Visit: Payer: Self-pay | Admitting: Family Medicine

## 2023-01-14 ENCOUNTER — Telehealth: Payer: Self-pay

## 2023-01-14 DIAGNOSIS — J45909 Unspecified asthma, uncomplicated: Secondary | ICD-10-CM

## 2023-01-14 NOTE — Telephone Encounter (Signed)
  Follow up Call-     01/13/2023   10:48 AM  Call back number  Post procedure Call Back phone  # [phone]  Permission to leave phone message Yes     Patient questions:  Do you have a fever, pain , or abdominal swelling? No. Pain Score  0 *  Have you tolerated food without any problems? Yes.    Have you been able to return to your normal activities? Yes.    Do you have any questions about your discharge instructions: Diet   No. Medications  No. Follow up visit  No.  Do you have questions or concerns about your Care? No.  Actions: * If pain score is 4 or above: No action needed, pain <4.

## 2023-01-16 ENCOUNTER — Other Ambulatory Visit: Payer: Self-pay | Admitting: Hematology and Oncology

## 2023-01-16 DIAGNOSIS — Z1231 Encounter for screening mammogram for malignant neoplasm of breast: Secondary | ICD-10-CM

## 2023-01-16 DIAGNOSIS — M8588 Other specified disorders of bone density and structure, other site: Secondary | ICD-10-CM

## 2023-01-16 DIAGNOSIS — N6091 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: Secondary | ICD-10-CM

## 2023-01-19 ENCOUNTER — Other Ambulatory Visit: Payer: Self-pay

## 2023-01-19 DIAGNOSIS — E1165 Type 2 diabetes mellitus with hyperglycemia: Secondary | ICD-10-CM

## 2023-01-19 MED ORDER — ONETOUCH VERIO VI STRP
ORAL_STRIP | 1 refills | Status: DC
Start: 2023-01-19 — End: 2023-02-24

## 2023-01-27 ENCOUNTER — Telehealth (INDEPENDENT_AMBULATORY_CARE_PROVIDER_SITE_OTHER): Payer: Self-pay | Admitting: Otolaryngology

## 2023-01-27 NOTE — Telephone Encounter (Signed)
Spoke with patient to confirm location and appointment

## 2023-01-28 ENCOUNTER — Encounter (INDEPENDENT_AMBULATORY_CARE_PROVIDER_SITE_OTHER): Payer: Self-pay

## 2023-01-28 ENCOUNTER — Ambulatory Visit (INDEPENDENT_AMBULATORY_CARE_PROVIDER_SITE_OTHER): Payer: Medicare Other | Admitting: Audiology

## 2023-01-28 ENCOUNTER — Ambulatory Visit (INDEPENDENT_AMBULATORY_CARE_PROVIDER_SITE_OTHER): Payer: Medicare Other | Admitting: Otolaryngology

## 2023-01-28 VITALS — Ht 65.0 in | Wt 214.0 lb

## 2023-01-28 DIAGNOSIS — H903 Sensorineural hearing loss, bilateral: Secondary | ICD-10-CM

## 2023-01-28 DIAGNOSIS — J342 Deviated nasal septum: Secondary | ICD-10-CM

## 2023-01-28 DIAGNOSIS — H608X3 Other otitis externa, bilateral: Secondary | ICD-10-CM

## 2023-01-28 DIAGNOSIS — J329 Chronic sinusitis, unspecified: Secondary | ICD-10-CM | POA: Diagnosis not present

## 2023-01-28 DIAGNOSIS — H938X3 Other specified disorders of ear, bilateral: Secondary | ICD-10-CM | POA: Diagnosis not present

## 2023-01-28 DIAGNOSIS — R42 Dizziness and giddiness: Secondary | ICD-10-CM

## 2023-01-28 DIAGNOSIS — J343 Hypertrophy of nasal turbinates: Secondary | ICD-10-CM

## 2023-01-28 DIAGNOSIS — R0981 Nasal congestion: Secondary | ICD-10-CM | POA: Diagnosis not present

## 2023-01-28 DIAGNOSIS — R2689 Other abnormalities of gait and mobility: Secondary | ICD-10-CM

## 2023-01-28 DIAGNOSIS — J324 Chronic pansinusitis: Secondary | ICD-10-CM

## 2023-01-28 MED ORDER — AMOXICILLIN-POT CLAVULANATE 875-125 MG PO TABS: 1.0000 | ORAL_TABLET | Freq: Two times a day (BID) | ORAL | 0 refills | Status: DC

## 2023-01-28 MED ORDER — AZELASTINE HCL 0.1 % NA SOLN: 2.0000 | Freq: Two times a day (BID) | NASAL | 12 refills | Status: DC

## 2023-01-28 MED ORDER — OFLOXACIN 0.3 % OP SOLN: 1.0000 [drp] | Freq: Two times a day (BID) | OPHTHALMIC | 1 refills | Status: AC

## 2023-01-28 NOTE — Patient Instructions (Addendum)
I have ordered an imaging study for you to complete prior to your next visit. Please call [facility] Scheduling at [phone] to schedule your imaging if you have not received a call within 24 hours. If you are unable to complete your imaging study prior to your next scheduled visit please call our office to let us know.   Take augmentin 1 tablet twice per day for 14 days Flonase two puffs twice daily Start astelin two puffs twice daily Daily Neilmed sinus rinses   Lloyd Huger Med Nasal Saline Rinse  - start nasal saline rinses with NeilMed Bottle available over the counter    Nasal Saline Irrigation instructions: If you choose to make your own salt water solution, You will need: Salt (kosher, canning, or pickling salt) Baking soda Nasal irrigation bottle (i.e. Lloyd Huger Med Sinus Rinse) Measuring spoon ( teaspoon) Distilled / boiled water   Mix solution Mix 1 teaspoon of salt, 1/2 teaspoon of baking soda and 1 cup of water into irrigation bottle ** May use saline packet instead of homemade recipe for this step if you prefer If medicine was prescribed to be mixed with solution, place this into bottle Examples 2 inches of 2% mupirocin ointment Budesonide solution Position your head: Lean over sink (about 45 degrees) Rotate head (about 45 degrees) so that one nostril is above the other Irrigate Insert tip of irrigation bottle into upper nostril so it forms a comfortable seal Irrigate while breathing through your mouth May remove the straw from the bottle in order to irrigate the entire solution (important if medicine was added) Exhale through nose when finished and blow nose as necessary  Repeat on opposite side with other 1/2 of solution (120 mL) or remake solution if all 240 mL was used on first side Wash irrigation bottle regularly, replace every 3 months

## 2023-01-28 NOTE — Progress Notes (Signed)
  [address], [address] [place], [place] [address] [phone]  Audiological Evaluation    Name: Molly Wall [facility]     DOB:   01/14/1946      MRN:   [id number]                                                                                     Service Date: 01/28/2023        Patient was referred today for a hearing evaluation by Dr. Allena Katz.  Symptoms Yes Details  Hearing loss  [x]  Patient reported perceiving a fullness/pressure in her ears.  Tinnitus  []  Patient denied experiencing tinnitus.  Balance problems  []  Patient reported intermittent vertigo sensations that started as slight episodes and developed into severe spinning episodes. She reported that she is currently not experiencing any vertigo sensations at this time.  Previous ear surgeries  []  Patient denied any previous ear surgeries.  Amplification  []  Patient denied the use of hearing aids.    Otoscopy: Right ear: Clear external ear canal; notable landmarks visualized on the tympanic membrane. Left ear: Clear external ear canal; notable landmarks visualized on the tympanic membrane.    Tympanogram: Right ear: Normal external ear canal volume with normal middle ear pressure and tympanic membrane compliance (Type A). Left ear: Normal external ear canal volume with normal middle ear pressure and tympanic membrane compliance (Type A).    Hearing Evaluation: The audiogram was completed using conventional audiometric techniques under headphones with good reliability.   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 Left ear: Normal hearing sensitivity from [phone] Hz sloping to severe sensorineural hearing loss from 3000-8000 Hz.  Speech Audiometry: Right ear- Speech Reception Threshold (SRT) was obtained at 25 dBHL. Left ear- Speech Reception Threshold (SRT) was obtained at 30 dBHL  masked.   Word Recognition Score Tested using NU-6 (MLV) Right ear: 100% was obtained at a presentation level of 65 dBHL which is deemed as excellent understanding. Left ear: 96% was obtained at a presentation level of 70 dBHL with contralateral masking which is deemed as excellent understanding.    Impression:  There is not a significant difference between word recognition scores between ears.   Recommendations: Repeat audiogram when changes are perceived or per MD. Patient is a candidate for hearing amplification, consider a hearing aid evaluation.   Conley Rolls Jeffren Dombek, AUD, CCC-A 01/28/23

## 2023-01-28 NOTE — Progress Notes (Signed)
Dear Dr. Chestine Spore, Here is my assessment for our mutual patient, Callee Druschel Wall. Thank you for allowing me the opportunity to care for your patient. Please do not hesitate to contact me should you have any other questions. Sincerely, Dr. Jovita Kussmaul  Otolaryngology Clinic Note  HISTORY: Molly Wall is a 77 y.o. female with kindly referred by Dr. Chestine Spore for evaluation of chronic sinusitis and ear complaints.  Patient reports: last two years have had bilateral ear fullness. Ears itch every day, and has been using q-tips to help. Hydrogen peroxide helps. She reports some unsteadiness (which is intermittent) but no vertigo consistently. She did have one episode of vertigo (which reportedly lasted two weeks) and some nausea, but this has resolved - she did not have tinnitus or hearing change or other symptoms during this. Did not try suppressants. No prior URI symptoms - primarily just lightheadedness now. She does report her sugars are quite labile.  Patient currently denies: ear pain, vertigo, drainage, tinnitus Patient additionally denies: deep pain in ear canal, eustachian tube symptoms Patient also denies barotrauma, vestibular suppressant use, ototoxic medication use Prior ear surgery: no  Never had eczema.  From sinus standpoint, She reports that she has had episodes of chronic sinusitis for two years, bilateral max pressure, frontal pressure, headaches. Some coughing (thick mucus), more recurrent now. No PND or discolored drainage anteriorly. Sense of smell comes and goes. No nasal obstruction. Symptom severity is moderate. Improvement occurred with flonase. Never tried astelin. She has tried Rinses in the past (Neti pot) with some benefit. Additional evaluation has included - CT. No typical AR symptoms. Allergy testing has not been done. No previous sinonasal surgery. Never had abx and steroids for this but has wanted some.  She is currently using Singulair, flonsae - it does  seem to help.  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  Audio (01/28/2023): reviewed independently and agree with read: A/A tymps Right ear: Normal hearing sensitivity from [phone] sloping to moderately-severe sensorineural hearing loss from 4000-8000 Hz. Left ear: Normal hearing sensitivity from [phone] sloping to severe sensorineural hearing loss from 3000-8000 Hz Appears symmetric, small ABG higher frequencies, slight (<10dB asymmetry)  Right ear: 100% was obtained at a presentation level of 65 dBHL which is deemed as excellent understanding. Left ear: 96% was obtained at a presentation level of 70 dBHL with contralateral masking which is deemed as excellent understanding  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   Breast cancer (HCC)    Diabetes mellitus without complication (HCC)    Family history of breast cancer    Family history of colon cancer    Hypercholesteremia    Hypothyroidism    Lumbar herniated disc    OSA (obstructive sleep apnea)    used to use CPAP, not any  more over 1 1/2 yrs   Osteoarthritis    Salivary stone    left side   Sleep apnea    no cpap in 2 yrs    Vitamin D deficiency    Past Surgical  History:  Procedure Laterality Date   APPENDECTOMY     BREAST LUMPECTOMY Right    per pt - precancerous   BREAST LUMPECTOMY WITH RADIOACTIVE SEED LOCALIZATION Right 08/12/2017   Procedure: RIGHT BREAST LUMPECTOMY X'S 2 WITH RADIOACTIVE SEED LOCALIZATION X'S 2;  Surgeon: Claud Kelp, MD;  Location: [facility];  Service: General;  Laterality: Right;   COLONOSCOPY     11 yrs ago in [place] - polyps but given a 10 yr recall per pt    GANGLION CYST EXCISION     77 years old   INCISION AND DRAINAGE ABSCESS ANAL     KNEE SURGERY  2014   SHOULDER SURGERY  1976   TONSILLECTOMY     age 3   Family History  Problem Relation Age of Onset   Alzheimer's disease Mother    Asthma Mother    Dementia Mother        cause of death   Arthritis Father    Heart attack Father 30       cause of death   Breast cancer Sister 40   Healthy Sister    Healthy Brother    Liver cancer Maternal Uncle    Colon cancer Paternal Grandmother    Healthy Daughter    Healthy Daughter    Colon polyps Neg Hx    Rectal cancer Neg Hx    Stomach cancer Neg Hx    Esophageal cancer Neg Hx    Social History   Tobacco Use   Smoking status: Never    Passive exposure: Never   Smokeless tobacco: Never  Substance Use Topics   Alcohol use: Not Currently    Comment: rarely   Allergies  Allergen Reactions   Iodine-131 Anaphylaxis    Pt states when she was 21=she drank an experimental Iodinated Radioisotope for her thyroid. She described it was a small amount in a milk carton in [place]. She states they stopped using it shortly after because the trial wasn't successful. SPM   Prednisone     Make sugar jump way up.   Current Outpatient Medications  Medication Sig Dispense Refill   albuterol (VENTOLIN HFA) 108 (90 Base) MCG/ACT inhaler INHALE 2 PUFFS BY MOUTH EVERY 6 HOURS AS NEEDED FOR WHEEZE OR SHORTNESS OF BREATH 18 each 1   amoxicillin-clavulanate (AUGMENTIN) 875-125 MG tablet Take 1 tablet  by mouth 2 (two) times daily for 14 days. 28 tablet 0   anastrozole (ARIMIDEX) 1 MG tablet TAKE 1 TABLET BY MOUTH EVERY DAY 90 tablet 1   atorvastatin (LIPITOR) 20 MG tablet TAKE 1 TABLET BY MOUTH EVERY DAY FOR CHOLESTEROL 90 tablet 2   azelastine (ASTELIN) 0.1 % nasal spray Place 2 sprays into both nostrils 2 (two) times daily. Use in each nostril as directed 30 mL 12   BD PEN NEEDLE NANO 2ND GEN 32G X 4 MM MISC USE AS DIRECTED 3 TIMES A DAY 100 each 3   Continuous Blood Gluc Receiver (DEXCOM G6 RECEIVER) DEVI Use to monitor blood sugar 1 each 0   Continuous Blood Gluc Transmit (DEXCOM G6 TRANSMITTER) MISC 1 Device by Does not apply route every 3 (three) months. 1 each 3   Continuous Glucose Sensor (DEXCOM G6 SENSOR) MISC USE AS INSTRUCTED  CHANGE SENSOR EVERY 10 DAYS E11.65 9 each 3   fluticasone (FLONASE) 50 MCG/ACT nasal spray INSTILL 1 SPRAY IN EACH NOSTRIL TWICE A DAY AS NEEDED FOR ALLERGIES OR RHINITIS 48 mL 2   gabapentin (NEURONTIN) 100 MG capsule Take 1-3 capsules by mouth once to twice daily for pain. 90 capsule 0   glucose blood (ONETOUCH VERIO) test strip Use as instructed 300 strip 1   insulin aspart (NOVOLOG) 100 UNIT/ML injection INJECT up to 70 UNITS INTO THE SKIN ONCE FOR 1 DOSE. VIA PUMP 90 mL 3   Insulin Disposable Pump (V-GO 30) 30 UNIT/24HR KIT 1 each by Does not apply route daily. 30 kit 5   insulin lispro (HUMALOG KWIKPEN) 100 UNIT/ML KwikPen INJECT 25 UNITS INTO THE SKIN 3 (THREE) TIMES DAILY WITH MEALS 15 mL PRN   levothyroxine (SYNTHROID) 88 MCG tablet TAKE 1 AND 1/2 TABLET BY MOUTH EVERY SUNDAY. TAKE 1 TABLET BY MOUTH MON THROUGH SAT. TAKE ON AN EMPTY STOMACH WITH WATER ONLY. NO FOOD OR OTHER MEDICATIONS FOR 30 MINUTES. 96 tablet 2   meclizine (ANTIVERT) 12.5 MG tablet Take 1 tablet (12.5 mg total) by mouth 3 (three) times daily as needed for dizziness. 30 tablet 0   metFORMIN (GLUCOPHAGE-XR) 500 MG 24 hr tablet TAKE 2 TABLETS (1,000 MG TOTAL) BY MOUTH DAILY WITH BREAKFAST.  FOR DIABETES. 180 tablet 1   montelukast (SINGULAIR) 10 MG tablet TAKE 1 TAB BY MOUTH AT BEDTIME FOR ALLERGIES AND ASTHMA. 90 tablet 2   ofloxacin (OCUFLOX) 0.3 % ophthalmic solution Place 1 drop into both ears in the morning and at bedtime for 14 days. 5 mL 1   PREBIOTIC PRODUCT PO Take 1 capsule by mouth daily.     Semaglutide, 1 MG/DOSE, (OZEMPIC, 1 MG/DOSE,) 4 MG/3ML SOPN INJECT 1MG  INTO THE SKIN ONCE A WEEK 9 mL 4   WIXELA INHUB 250-50 MCG/ACT AEPB INHALE 1 PUFF BY MOUTH TWICE A DAY (Patient taking differently: Inhale 1 puff into the lungs daily.) 60 each 9   No current facility-administered medications for this visit.   Ht 5\' 5"  (1.651 m)   Wt 214 lb (97.1 kg)   BMI 35.61 kg/m   PHYSICAL EXAM:  Ht 5\' 5"  (1.651 m)   Wt 214 lb (97.1 kg)   BMI 35.61 kg/m    Salient findings:  CN II-XII intact  Bilateral EAC clear and TM intact with well pneumatized middle ear spaces; mild flakey skin b/l EAC Weber 512: midline Rinne 512: AC > BC b/l  No gross nystagmus; EOM intact Nose: Anterior rhinoscopy reveals modest septal deviation and bilateral inferior turbinate hypertrophy.  Nasal endoscopy was indicated to better evaluate the nose and paranasal sinuses, given the patient's history and exam findings, and is detailed below. No lesions of oral cavity/oropharynx; dentition fair No obviously palpable neck masses/lymphadenopathy/thyromegaly No respiratory distress or stridor   PROCEDURE: Diagnostic Nasal Endoscopy Pre-procedure diagnosis: Concern for chronic sinusitis Post-procedure diagnosis: same Indication: See pre-procedure diagnosis and physical exam above Complications: None apparent EBL: 0 mL Anesthesia: Lidocaine 4% and topical decongestant was topically sprayed in each nasal cavity  Description of Procedure:  Patient was identified. A rigid 30 degree endoscope was utilized to evaluate the sinonasal cavities, mucosa, sinus ostia and turbinates and septum.  Overall, signs of  mucosal inflammation are noted with trace mucoid secretions over left MM.  No mucopurulence, polyps, or masses noted.   Right Middle meatus: mucoid secretions (some) Right SE Recess: clear Left MM: clear Left SE Recess:  clear   Photodocumentation was obtained.  CPT CODE -- 31231 - Mod 25   ASSESSMENT:  77 yo w/ T2DM on insulin pump with: 1. Chronic pansinusitis   2. Nasal congestion   3. Hypertrophy of both inferior nasal turbinates   4. Nasal septal deviation   5. Ear fullness, bilateral   6. Imbalance   7. Chronic eczematous otitis externa of both ears   8. Vertigo   9. Bilateral sensorineural hearing loss    Sinus complaints: She appears to have chronic pressure and cardinal chronic sinusitis symptoms as well as CT findings corroborating this. Never has had abx/steroids. No frequent typical AR symptoms. 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 She does not have Meniere's sx and her imbalance now appears to be intermittent and could be related to her labile sugars or orthostasis. Could be neuro or cardiology related as well; will observe for now, and if persists, consider vestib testing; consider neuro and cards referral  See below regarding exact medications prescribed this encounter including dosages and  route: Meds ordered this encounter  Medications   ofloxacin (OCUFLOX) 0.3 % ophthalmic solution    Sig: Place 1 drop into both ears in the morning and at bedtime for 14 days.    Dispense:  5 mL    Refill:  1   amoxicillin-clavulanate (AUGMENTIN) 875-125 MG tablet    Sig: Take 1 tablet by mouth 2 (two) times daily for 14 days.    Dispense:  28 tablet    Refill:  0   azelastine (ASTELIN) 0.1 % nasal spray    Sig: Place 2 sprays into both nostrils 2 (two) times daily. Use in each nostril as directed    Dispense:  30 mL    Refill:  12     Thank you for allowing me the opportunity to care for your patient. Please do not hesitate to contact me should you have any other questions.  Sincerely, Jovita Kussmaul, MD Otolarynoglogist (ENT), [facility] Phone: [phone] Fax: [phone]  01/28/2023, 2:22 PM   I have personally spent 65 minutes involved in face-to-face and non-face-to-face activities for this patient on the day of the visit.  Professional time spent includes the following activities, in addition to those noted in the documentation: preparing to see the patient (review of outside documentation and results), performing a medically appropriate examination and/or evaluation, counseling and educating the patient/family/caregiver, ordering medications, performing procedures (endoscopy), referring and communicating with other healthcare professionals, documenting clinical information in the electronic or other health record, independently interpreting results and communicating results with the patient

## 2023-01-29 ENCOUNTER — Other Ambulatory Visit: Payer: Self-pay | Admitting: Internal Medicine

## 2023-01-29 DIAGNOSIS — E1165 Type 2 diabetes mellitus with hyperglycemia: Secondary | ICD-10-CM

## 2023-02-06 ENCOUNTER — Ambulatory Visit: Payer: Medicare Other | Admitting: Rheumatology

## 2023-02-06 ENCOUNTER — Encounter: Payer: Self-pay | Admitting: Audiology

## 2023-02-11 ENCOUNTER — Encounter: Payer: Self-pay | Admitting: Family Medicine

## 2023-02-11 ENCOUNTER — Ambulatory Visit: Payer: Medicare Other | Admitting: Primary Care

## 2023-02-11 ENCOUNTER — Ambulatory Visit (INDEPENDENT_AMBULATORY_CARE_PROVIDER_SITE_OTHER): Payer: Medicare Other | Admitting: Family Medicine

## 2023-02-11 ENCOUNTER — Ambulatory Visit (INDEPENDENT_AMBULATORY_CARE_PROVIDER_SITE_OTHER)
Admission: RE | Admit: 2023-02-11 | Discharge: 2023-02-11 | Disposition: A | Discharge status: Home / Self Care | Payer: Medicare Other | Source: Ambulatory Visit | Attending: Family Medicine | Admitting: Family Medicine

## 2023-02-11 VITALS — BP 130/80 | HR 81 | Temp 98.7°F | Ht 65.0 in | Wt 222.1 lb

## 2023-02-11 DIAGNOSIS — R058 Other specified cough: Secondary | ICD-10-CM | POA: Insufficient documentation

## 2023-02-11 DIAGNOSIS — J4520 Mild intermittent asthma, uncomplicated: Secondary | ICD-10-CM

## 2023-02-11 DIAGNOSIS — J329 Chronic sinusitis, unspecified: Secondary | ICD-10-CM | POA: Diagnosis not present

## 2023-02-11 DIAGNOSIS — J209 Acute bronchitis, unspecified: Secondary | ICD-10-CM | POA: Insufficient documentation

## 2023-02-11 LAB — POC COVID19 BINAXNOW: SARS Coronavirus 2 Ag: NEGATIVE

## 2023-02-11 MED ORDER — BENZONATATE 200 MG PO CAPS: 200.0000 mg | ORAL_CAPSULE | Freq: Three times a day (TID) | ORAL | 0 refills | Status: DC | PRN

## 2023-02-11 NOTE — Progress Notes (Signed)
Subjective:    Patient ID: Molly Wall, female    DOB: 04-04-1945, 77 y.o.   MRN: [id number]  HPI  Wt Readings from Last 3 Encounters:  02/11/23 222 lb 2 oz (100.8 kg)  01/28/23 214 lb (97.1 kg)  01/13/23 214 lb (97.1 kg)   36.96 kg/m  Vitals:   02/11/23 1620  BP: 130/80  Pulse: 81  Temp: 98.7 F (37.1 C)  SpO2: 98%    77 yo pt of NP clark presents for cough with fatigue and generalized weakness and sinusitis     She has a history of chronic pansinusitis as well as OE and vertigo and hearing loss  Saw Dr Allena Katz (ENT) on 11/13  Flonase Netti pot  Singulair  Was prescribed augmentin and astelin ns   Did not help symptoms at all  Coughing all night  Productive   Thick mucous  Constant drainage  Blowing nose  Clearing throat   Very fatigued  Exhaustion    Of note-she avoids prednisone- it causes great increase in blood sugar   History of asthma  Wixhela 250-50 mcg 1 puff every day  (does occational bid)  Albuterol mdi prn  Singulair   Used a dulterra oil yesterday and it helped a little bit   Diabetic Sees endocrinology   Covid test is negative   DG Chest 2 View  Result Date: 02/11/2023 CLINICAL DATA:  Productive cough, weakness, and fatigue. EXAM: CHEST - 2 VIEW COMPARISON:  12/06/2021 FINDINGS: The heart size and mediastinal contours are within normal limits. Both lungs are clear. The visualized skeletal structures are unremarkable. IMPRESSION: No active cardiopulmonary disease. Electronically Signed   By: Danae Orleans M.D.   On: 02/11/2023 16:54      Patient Active Problem List   Diagnosis Date Noted   Productive cough 02/11/2023   Chronic sinusitis 02/11/2023   Acute bronchitis 02/11/2023   Dizziness 11/20/2022   Neuropathic pain of both feet 10/31/2021   GERD (gastroesophageal reflux disease) 04/25/2021   Vegan diet 02/21/2020   Word finding difficulty 02/21/2020   Arthralgia of both hands 11/29/2019   Seasonal allergic  rhinitis 11/29/2019   Chronic knee pain (intermittent) (Right) 11/01/2018   Chronic pain syndrome 10/31/2018   History iodine allergy 09/28/2018    Class: History of   DDD (degenerative disc disease), lumbar 09/27/2018   Grade 1 Anterolisthesis of L5/S1 09/16/2018   Lumbar facet arthropathy 09/16/2018   Arthropathy of lumbosacral facet joint 09/16/2018   Sialadenitis 08/24/2018   Atypical lobular hyperplasia [facility]) of right breast 11/06/2017   Family history of breast cancer    Family history of colon cancer    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   Constipation 04/13/2017   Chronic low back pain (Secondary area of Pain) (Bilateral) (R>L) 02/13/2016   Chronic lower extremity pain (Primary Area of Pain) (Right) 02/13/2016   Chronic lumbar radicular pain (Right) 02/13/2016   Type 2 diabetes mellitus with hyperglycemia, with long-term current use of insulin (HCC) 07/18/2015   Vitamin D deficiency 07/18/2015   Hyperlipidemia 12/13/2014   Asthma 12/13/2014   Hypothyroidism 12/13/2014   Lumbar herniated disc 12/13/2014   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   Breast cancer (HCC)    Diabetes mellitus without complication (HCC)  Family history of breast cancer    Family history of colon cancer    Hypercholesteremia    Hypothyroidism    Lumbar herniated disc    OSA (obstructive sleep apnea)    used to use CPAP, not any more over 1 1/2 yrs   Osteoarthritis    Salivary stone    left side   Sleep apnea    no cpap in 2 yrs    Vitamin D deficiency    Past Surgical History:  Procedure Laterality Date   APPENDECTOMY     BREAST LUMPECTOMY Right    per pt - precancerous   BREAST LUMPECTOMY WITH RADIOACTIVE SEED LOCALIZATION Right 08/12/2017   Procedure: RIGHT BREAST LUMPECTOMY X'S 2 WITH  RADIOACTIVE SEED LOCALIZATION X'S 2;  Surgeon: Claud Kelp, MD;  Location: [facility];  Service: General;  Laterality: Right;   COLONOSCOPY     11 yrs ago in [place] - polyps but given a 10 yr recall per pt    GANGLION CYST EXCISION     77 years old   INCISION AND DRAINAGE ABSCESS ANAL     KNEE SURGERY  2014   SHOULDER SURGERY  1976   TONSILLECTOMY     age 52   Social History   Tobacco Use   Smoking status: Never    Passive exposure: Never   Smokeless tobacco: Never  Vaping Use   Vaping status: Never Used  Substance Use Topics   Alcohol use: Not Currently    Comment: rarely   Drug use: No   Family History  Problem Relation Age of Onset   Alzheimer's disease Mother    Asthma Mother    Dementia Mother        cause of death   Arthritis Father    Heart attack Father 57       cause of death   Breast cancer Sister 22   Healthy Sister    Healthy Brother    Liver cancer Maternal Uncle    Colon cancer Paternal Grandmother    Healthy Daughter    Healthy Daughter    Colon polyps Neg Hx    Rectal cancer Neg Hx    Stomach cancer Neg Hx    Esophageal cancer Neg Hx    Allergies  Allergen Reactions   Iodine-131 Anaphylaxis    Pt states when she was 21=she drank an experimental Iodinated Radioisotope for her thyroid. She described it was a small amount in a milk carton in [place]. She states they stopped using it shortly after because the trial wasn't successful. SPM   Prednisone     Make sugar jump way up.   Current Outpatient Medications on File Prior to Visit  Medication Sig Dispense Refill   albuterol (VENTOLIN HFA) 108 (90 Base) MCG/ACT inhaler INHALE 2 PUFFS BY MOUTH EVERY 6 HOURS AS NEEDED FOR WHEEZE OR SHORTNESS OF BREATH 18 each 1   anastrozole (ARIMIDEX) 1 MG tablet TAKE 1 TABLET BY MOUTH EVERY DAY 90 tablet 1   atorvastatin (LIPITOR) 20 MG tablet TAKE 1 TABLET BY MOUTH EVERY DAY FOR CHOLESTEROL 90 tablet 2   azelastine (ASTELIN) 0.1 % nasal  spray Place 2 sprays into both nostrils 2 (two) times daily. Use in each nostril as directed 30 mL 12   BD PEN NEEDLE NANO 2ND GEN 32G X 4 MM MISC USE AS DIRECTED 3 TIMES A DAY 100 each 3   Continuous Blood Gluc Receiver (DEXCOM G6 RECEIVER) DEVI Use to monitor blood  sugar 1 each 0   Continuous Glucose Sensor (DEXCOM G6 SENSOR) MISC USE AS INSTRUCTED CHANGE SENSOR EVERY 10 DAYS E11.65 9 each 3   Continuous Glucose Transmitter (DEXCOM G6 TRANSMITTER) MISC REPLACE EVERY 3 (THREE) MONTHS. 1 each 3   fluticasone (FLONASE) 50 MCG/ACT nasal spray INSTILL 1 SPRAY IN EACH NOSTRIL TWICE A DAY AS NEEDED FOR ALLERGIES OR RHINITIS 48 mL 2   gabapentin (NEURONTIN) 100 MG capsule Take 1-3 capsules by mouth once to twice daily for pain. 90 capsule 0   glucose blood (ONETOUCH VERIO) test strip Use as instructed 300 strip 1   insulin aspart (NOVOLOG) 100 UNIT/ML injection INJECT up to 70 UNITS INTO THE SKIN ONCE FOR 1 DOSE. VIA PUMP 90 mL 3   Insulin Disposable Pump (V-GO 30) 30 UNIT/24HR KIT 1 each by Does not apply route daily. 30 kit 5   insulin lispro (HUMALOG KWIKPEN) 100 UNIT/ML KwikPen INJECT 25 UNITS INTO THE SKIN 3 (THREE) TIMES DAILY WITH MEALS 15 mL PRN   levothyroxine (SYNTHROID) 88 MCG tablet TAKE 1 AND 1/2 TABLET BY MOUTH EVERY SUNDAY. TAKE 1 TABLET BY MOUTH MON THROUGH SAT. TAKE ON AN EMPTY STOMACH WITH WATER ONLY. NO FOOD OR OTHER MEDICATIONS FOR 30 MINUTES. 96 tablet 2   meclizine (ANTIVERT) 12.5 MG tablet Take 1 tablet (12.5 mg total) by mouth 3 (three) times daily as needed for dizziness. 30 tablet 0   metFORMIN (GLUCOPHAGE-XR) 500 MG 24 hr tablet TAKE 2 TABLETS (1,000 MG TOTAL) BY MOUTH DAILY WITH BREAKFAST. FOR DIABETES. 180 tablet 1   montelukast (SINGULAIR) 10 MG tablet TAKE 1 TAB BY MOUTH AT BEDTIME FOR ALLERGIES AND ASTHMA. 90 tablet 2   PREBIOTIC PRODUCT PO Take 1 capsule by mouth daily.     Semaglutide, 1 MG/DOSE, (OZEMPIC, 1 MG/DOSE,) 4 MG/3ML SOPN INJECT 1MG  INTO THE SKIN ONCE A WEEK 9 mL  4   WIXELA INHUB 250-50 MCG/ACT AEPB INHALE 1 PUFF BY MOUTH TWICE A DAY (Patient taking differently: Inhale 1 puff into the lungs daily.) 60 each 9   No current facility-administered medications on file prior to visit.    Review of Systems     Objective:   Physical Exam Constitutional:      General: She is not in acute distress.    Appearance: Normal appearance. She is well-developed. She is obese. She is not ill-appearing, toxic-appearing or diaphoretic.  HENT:     Head: Normocephalic and atraumatic.     Comments: Nares are injected and congested    No facial tenderness    Right Ear: Tympanic membrane, ear canal and external ear normal.     Left Ear: Tympanic membrane, ear canal and external ear normal.     Nose: Congestion and rhinorrhea present.     Mouth/Throat:     Mouth: Mucous membranes are moist.     Pharynx: Oropharynx is clear. No oropharyngeal exudate or posterior oropharyngeal erythema.     Comments: Clear pnd  Eyes:     General:        Right eye: No discharge.        Left eye: No discharge.     Conjunctiva/sclera: Conjunctivae normal.     Pupils: Pupils are equal, round, and reactive to light.  Cardiovascular:     Rate and Rhythm: Normal rate.     Heart sounds: Normal heart sounds.  Pulmonary:     Effort: Pulmonary effort is normal. No respiratory distress.     Breath sounds: Normal breath sounds.  No stridor. No wheezing, rhonchi or rales.     Comments: Few scattered rhonchi  Scant wheeze only at end of forced expiration   No rales   Chest:     Chest wall: No tenderness.  Musculoskeletal:     Cervical back: Normal range of motion and neck supple.  Lymphadenopathy:     Cervical: No cervical adenopathy.  Skin:    General: Skin is warm and dry.     Capillary Refill: Capillary refill takes less than 2 seconds.     Findings: No rash.  Neurological:     Mental Status: She is alert.     Cranial Nerves: No cranial nerve deficit.  Psychiatric:        Mood  and Affect: Mood normal.           Assessment & Plan:   Problem List Items Addressed This Visit       Respiratory   Chronic sinusitis    Reviewed last ENT note and studies Her history is extensive   Took course of augmentin Still symptomatic / her mucous is clear however Cannot take steroids Instructed to touch base with ENT for next steps       Relevant Medications   benzonatate (TESSALON) 200 MG capsule   Asthma    Some exacerbation with current bronchitis  Cannot have systemic steroids  Instructed to increase wixela to bid from daily   Update if not starting to improve in a week or if worsening  Call back and Er precautions noted in detail today        Acute bronchitis - Primary    In the setting of recently treated acute on chronic sinusitis Suspect viral  Productive cough/ fatigue and some reactive airways  Per pt - cannot have oral or IM steroids due to brittle diabetes Discussed symptomatic care -see AVS  Cannot take very sedating medicine due to fear of not waking up when her CGM alerts her with low glucose Cxr is clear/ reassuring Covid test is negative  Sent in tessalon for cough Instructed to go up on wixhela to bid (taking daily)  Update if not starting to improve in a week or if worsening  Call back and Er precautions noted in detail today           Other   Productive cough   Relevant Orders   DG Chest 2 View (Completed)   POC COVID-19 BinaxNow (Completed)   Other Visit Diagnoses     Mild intermittent asthma, uncomplicated

## 2023-02-11 NOTE — Patient Instructions (Addendum)
Go up on the wixela to twice daily for now (until cough improves)  Continue your other medicines   Try over the counter delsym for cough  Tessalon for cough - to pharmacy   Check in with your ENT about the sinus issues

## 2023-02-13 NOTE — Assessment & Plan Note (Signed)
Some exacerbation with current bronchitis  Cannot have systemic steroids  Instructed to increase wixela to bid from daily   Update if not starting to improve in a week or if worsening  Call back and Er precautions noted in detail today

## 2023-02-13 NOTE — Assessment & Plan Note (Addendum)
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

## 2023-02-13 NOTE — Assessment & Plan Note (Addendum)
Reviewed last ENT note and studies Her history is extensive   Took course of augmentin Still symptomatic / her mucous is clear however Cannot take steroids Instructed to touch base with ENT for next steps

## 2023-02-24 ENCOUNTER — Other Ambulatory Visit: Payer: Self-pay | Admitting: Primary Care

## 2023-02-24 ENCOUNTER — Other Ambulatory Visit: Payer: Self-pay | Admitting: Family Medicine

## 2023-02-24 DIAGNOSIS — E1165 Type 2 diabetes mellitus with hyperglycemia: Secondary | ICD-10-CM

## 2023-02-24 DIAGNOSIS — J45909 Unspecified asthma, uncomplicated: Secondary | ICD-10-CM

## 2023-02-24 NOTE — Telephone Encounter (Signed)
Albuterol inh Last filled:  01/08/23, #18 ea Last OV:  02/11/23, acute bronchitis Next OV:  none

## 2023-02-25 ENCOUNTER — Other Ambulatory Visit: Payer: Self-pay | Admitting: Primary Care

## 2023-02-25 DIAGNOSIS — E1165 Type 2 diabetes mellitus with hyperglycemia: Secondary | ICD-10-CM

## 2023-03-19 ENCOUNTER — Ambulatory Visit: Payer: Self-pay | Admitting: Primary Care

## 2023-03-19 NOTE — Telephone Encounter (Signed)
 I last saw her for cough on 11/27, over a month ago  This sounds new  Do recommend a visit as planned  Will cc to pcp

## 2023-03-19 NOTE — Telephone Encounter (Signed)
 Copied from CRM [id number]. Topic: Clinical - Pink Word Triage >> Mar 19, 2023  4:21 PM Mercedes MATSU wrote: Reason for Triage: 78 yo patient called in stating she has a really bad cold, with a lot of mucus and phlegm. Is scheduled to be seen on 1/7, but is requesting to speak to a nurse to be prescribed something sooner. No chest pain, or difficulty breathing.   Chief Complaint: Cough Symptoms: Cough, rhinorrhea, nasal congestion  Frequency: Improving  Pertinent Negatives: Patient denies fever, difficulty breathing  Disposition: [] ED /[] Urgent Care (no appt availability in office) / [] Appointment(In office/virtual)/ []  [facility]/ [x] Home Care/ [] Refused Recommended Disposition /[] [facility]/ []  Follow-up with PCP Additional Notes: Patient reports a that began a week and a half ago. She reports that her cough has been productive of dark ochre colored sputum, which is more clear today. She reports that today her symptoms are improved and she is feeling better but promised her sister she would call. Patient has an appointment for 03/24/23 and is comfortable with waiting for that appointment. Home care advice given to the patient. Patient advised to follow up with urgent care or the ED if she her symptoms worsen, if she develops a fever, or shortness of breath. She understood and is agreeable with this plan.    Reason for Disposition  Cough with cold symptoms (e.g., runny nose, postnasal drip, throat clearing)  Answer Assessment - Initial Assessment Questions 1. ONSET: When did the cough begin?      1.5 weeks ago 2. SEVERITY: How bad is the cough today?      Improved from onset  3. SPUTUM: Describe the color of your sputum (none, dry cough; clear, white, yellow, green)     Dark ochre colored, but more clear today  4. HEMOPTYSIS: Are you coughing up any blood? If so ask: How much? (flecks, streaks, tablespoons, etc.)     No 5. DIFFICULTY BREATHING: Are you having  difficulty breathing? If Yes, ask: How bad is it? (e.g., mild, moderate, severe)    - MILD: No SOB at rest, mild SOB with walking, speaks normally in sentences, can lie down, no retractions, pulse < 100.    - MODERATE: SOB at rest, SOB with minimal exertion and prefers to sit, cannot lie down flat, speaks in phrases, mild retractions, audible wheezing, pulse 100-120.    - SEVERE: Very SOB at rest, speaks in single words, struggling to breathe, sitting hunched forward, retractions, pulse > 120      No 6. FEVER: Do you have a fever? If Yes, ask: What is your temperature, how was it measured, and when did it start?     No 7. CARDIAC HISTORY: Do you have any history of heart disease? (e.g., heart attack, congestive heart failure)      No 8. LUNG HISTORY: Do you have any history of lung disease?  (e.g., pulmonary embolus, asthma, emphysema)     Asthma  9. PE RISK FACTORS: Do you have a history of blood clots? (or: recent major surgery, recent prolonged travel, bedridden)     No 10. OTHER SYMPTOMS: Do you have any other symptoms? (e.g., runny nose, wheezing, chest pain)       Generalized weakness 11. PREGNANCY: Is there any chance you are pregnant? When was your last menstrual period?       No 12. TRAVEL: Have you traveled out of the country in the last month? (e.g., travel history, exposures)  No  Protocols used: Cough - Acute Productive-A-AH

## 2023-03-19 NOTE — Telephone Encounter (Signed)
 Routed to PCP and provider who she saw for cough.

## 2023-03-20 NOTE — Telephone Encounter (Signed)
 Noted. Glad she is feeling better. Okay to proceed with scheduled appointment if she continues to feel better.

## 2023-03-24 ENCOUNTER — Ambulatory Visit: Payer: Medicare Other | Admitting: Internal Medicine

## 2023-03-24 ENCOUNTER — Encounter: Payer: Self-pay | Admitting: Family Medicine

## 2023-03-24 ENCOUNTER — Encounter: Payer: Self-pay | Admitting: Internal Medicine

## 2023-03-24 ENCOUNTER — Ambulatory Visit (INDEPENDENT_AMBULATORY_CARE_PROVIDER_SITE_OTHER): Payer: Medicare Other | Admitting: Family Medicine

## 2023-03-24 ENCOUNTER — Other Ambulatory Visit: Payer: Self-pay | Admitting: Internal Medicine

## 2023-03-24 VITALS — BP 130/60 | HR 79 | Temp 98.2°F | Ht 65.0 in | Wt 234.5 lb

## 2023-03-24 VITALS — BP 130/74 | HR 78 | Ht 65.0 in | Wt 234.4 lb

## 2023-03-24 DIAGNOSIS — E1165 Type 2 diabetes mellitus with hyperglycemia: Secondary | ICD-10-CM

## 2023-03-24 DIAGNOSIS — J324 Chronic pansinusitis: Secondary | ICD-10-CM

## 2023-03-24 DIAGNOSIS — B999 Unspecified infectious disease: Secondary | ICD-10-CM | POA: Diagnosis not present

## 2023-03-24 DIAGNOSIS — E785 Hyperlipidemia, unspecified: Secondary | ICD-10-CM | POA: Diagnosis not present

## 2023-03-24 DIAGNOSIS — Z794 Long term (current) use of insulin: Secondary | ICD-10-CM

## 2023-03-24 LAB — LIPID PANEL
Cholesterol: 213 mg/dL — ABNORMAL HIGH (ref 0–200)
HDL: 93.4 mg/dL (ref >39.00)
LDL Cholesterol: 107 mg/dL — ABNORMAL HIGH (ref 0–99)
NonHDL: 119.58
Total CHOL/HDL Ratio: 2
Triglycerides: 65 mg/dL (ref 0.0–149.0)
VLDL: 13 mg/dL (ref 0.0–40.0)

## 2023-03-24 LAB — COMPREHENSIVE METABOLIC PANEL
ALT: 16 U/L (ref 0–35)
AST: 18 U/L (ref 0–37)
Albumin: 4.1 g/dL (ref 3.5–5.2)
Alkaline Phosphatase: 121 U/L — ABNORMAL HIGH (ref 39–117)
BUN: 16 mg/dL (ref 6–23)
CO2: 32 meq/L (ref 19–32)
Calcium: 9.2 mg/dL (ref 8.4–10.5)
Chloride: 99 meq/L (ref 96–112)
Creatinine, Ser: 0.88 mg/dL (ref 0.40–1.20)
GFR: 64.4 mL/min (ref >60.00)
Glucose, Bld: 211 mg/dL — ABNORMAL HIGH (ref 70–99)
Potassium: 5.1 meq/L (ref 3.5–5.1)
Sodium: 138 meq/L (ref 135–145)
Total Bilirubin: 0.3 mg/dL (ref 0.2–1.2)
Total Protein: 6.9 g/dL (ref 6.0–8.3)

## 2023-03-24 LAB — CBC WITH DIFFERENTIAL/PLATELET
Basophils Absolute: 0 10*3/uL (ref 0.0–0.1)
Basophils Relative: 0.8 % (ref 0.0–3.0)
Eosinophils Absolute: 0.3 10*3/uL (ref 0.0–0.7)
Eosinophils Relative: 5.1 % — ABNORMAL HIGH (ref 0.0–5.0)
HCT: 40.3 % (ref 36.0–46.0)
Hemoglobin: 13.1 g/dL (ref 12.0–15.0)
Lymphocytes Relative: 19 % (ref 12.0–46.0)
Lymphs Abs: 1.1 10*3/uL (ref 0.7–4.0)
MCHC: 32.4 g/dL (ref 30.0–36.0)
MCV: 87.5 fL (ref 78.0–100.0)
Monocytes Absolute: 0.5 10*3/uL (ref 0.1–1.0)
Monocytes Relative: 8.2 % (ref 3.0–12.0)
Neutro Abs: 3.9 10*3/uL (ref 1.4–7.7)
Neutrophils Relative %: 66.9 % (ref 43.0–77.0)
Platelets: 293 10*3/uL (ref 150.0–400.0)
RBC: 4.6 Mil/uL (ref 3.87–5.11)
RDW: 13.8 % (ref 11.5–15.5)
WBC: 5.9 10*3/uL (ref 4.0–10.5)

## 2023-03-24 LAB — POCT GLYCOSYLATED HEMOGLOBIN (HGB A1C): Hemoglobin A1C: 7.3 % — AB (ref 4.0–5.6)

## 2023-03-24 LAB — HEMOGLOBIN A1C: Hgb A1c MFr Bld: 7.5 % — ABNORMAL HIGH (ref 4.6–6.5)

## 2023-03-24 MED ORDER — FIASP 100 UNIT/ML IJ SOLN: INTRAMUSCULAR | 3 refills | Status: DC

## 2023-03-24 NOTE — Assessment & Plan Note (Signed)
 Discussed factors in detail that could be contributing to recent infections.  She is concerned about why she cannot seem to get better. Discussed age and diabetes contributing to infection issues. Today there is no clear viral or obvious bacterial infection but there is still some concern for chronic sinusitis.  Will evaluate with CBC

## 2023-03-24 NOTE — Assessment & Plan Note (Signed)
 Chronic, labile control.  Patient requests labs drawn today including A1c, complete metabolic panel and lipid panel.  She has appointment with endocrinology today

## 2023-03-24 NOTE — Assessment & Plan Note (Signed)
 Some continued symptoms possibly secondary to environmental allergies versus persistent chronic pansinusitis.  She has scheduled CT scan of sinuses followed by appointment with Dr. Allena Katz, ENT.

## 2023-03-24 NOTE — Progress Notes (Signed)
 Patient ID: Rock Redman Crosslin McDill, female   DOB: Apr 10, 1945, 78 y.o.   MRN: [id number]   HPI: Jagger Beahm McDill is a 78 y.o.-year-old female, initially referred by her PCP, Comer Gaskins, MD, returning for follow-up for DM2, dx at 78 (2017), h/o reactive hypoglycemia, insulin -dependent, uncontrolled, without long-term complications.  Last visit 4 months ago.  Interim history: She denies increased urination, blurry vision, nausea, chest pain.  She had a URI 2 mo ago x 3 weeks >> increased fatigue.This recurred 2 weeks ago.  She was very fatigued during this period.  Reviewed HbA1c levels: Lab Results  Component Value Date   HGBA1C 7.4 (A) 11/18/2022   HGBA1C 7.7 (A) 04/15/2022   HGBA1C 8.4 (A) 12/09/2021   HGBA1C 7.4 (A) 08/08/2021   HGBA1C 7.2 (A) 04/04/2021   HGBA1C 7.1 (A) 01/01/2021   HGBA1C 6.8 (A) 09/20/2020   HGBA1C 8.1 (A) 06/21/2020   HGBA1C 8.7 (A) 03/19/2020   HGBA1C 8.2 (A) 09/07/2019   She is on: - Metformin  ER 1000 mg with breakfast - Ozempic  0.5 >> 1 mg weekly  - VGo 30 >> ... >>- VGo20 with 5-7 clicks before meals +/- Novolog  2-4 units for correction (when on steroids) >> Humalog  6-7 clicks before b'fast and lunch; for dinner, take 7-8 clicks >> VGo30 For dinner, take 7-8 clicks (14-16 units). If sugars before meals <60, do not take Novolog  before that meal. If sugars before meals <90, try to move the bolus right before the meal  Previously on: - Lantus  35 >> 25 units in am >> 25 units in a.m. and 10 units at bedtime >> 20 units in a.m. and 10 units at night - Novolog  7-9 units before 15 min before meals >> B'fast: 6-8 units  Lunch: 10-11 units Dinner: 6-7 units  She checks her sugars more than 4 times a day with her CGM:  Previously:   Previously:   Lowest sugar was 80s >> 40s >> 40s >> 40s >> 39, 50s; she has hypoglycemia awareness in the 80s. Highest sugar was 565 >> 500s >> >600 >> HI >> 600.   Glucometer: One Touch Verio  -No CKD,  last BUN/creatinine:  Lab Results  Component Value Date   BUN 17 11/20/2022   BUN 12 01/23/2022   CREATININE 0.90 11/20/2022   CREATININE 0.71 01/23/2022   Lab Results  Component Value Date   MICRALBCREAT 0.5 11/20/2022   MICRALBCREAT 0.8 10/31/2021   MICRALBCREAT 1.1 09/20/2020   MICRALBCREAT 1.0 04/27/2018   MICRALBCREAT 1.9 04/10/2016  Not on ACE inhibitor/ARB.  -No HL; last set of lipids: Lab Results  Component Value Date   CHOL 182 11/20/2022   HDL 78.40 11/20/2022   LDLCALC 85 11/20/2022   TRIG 93.0 11/20/2022   CHOLHDL 2 11/20/2022  On Lipitor 20.  - last eye exam was 07/14/2022: No DR reportedly, + cataracts.   -No numbness but has tingling in her feet. She has pain in feet. Last foot exam: 06/04/2022.  Pt has FH of DM in [place].  She has OSA and started a CPAP after which she felt much better. 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   She is on levothyroxine  88 mcg 1 tablet 6/7 days and 1.5 tablets 1/7 days.   Latest TSH was normal: Lab Results  Component  Value Date   TSH 4.82 11/20/2022   She has a h/o constipation.  Tried metamucil, dulcolax, enemas, then using a supplement by Dr. Penelope >> helping.  She lives alone.  ROS: + See HPI  I reviewed pt's medications, allergies, PMH, social hx, family hx, and changes were documented in the history of present illness. Otherwise, unchanged from my initial visit note.  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   Breast cancer (HCC)    Diabetes mellitus without complication (HCC)    Family history of breast cancer    Family history of colon cancer    Hypercholesteremia     Hypothyroidism    Lumbar herniated disc    OSA (obstructive sleep apnea)    used to use CPAP, not any more over 1 1/2 yrs   Osteoarthritis    Salivary stone    left side   Sleep apnea    no cpap in 2 yrs    Vitamin D  deficiency    Past Surgical History:  Procedure Laterality Date   APPENDECTOMY     BREAST LUMPECTOMY Right    per pt - precancerous   BREAST LUMPECTOMY WITH RADIOACTIVE SEED LOCALIZATION Right 08/12/2017   Procedure: RIGHT BREAST LUMPECTOMY X'S 2 WITH RADIOACTIVE SEED LOCALIZATION X'S 2;  Surgeon: Gail Favorite, MD;  Location: [facility];  Service: General;  Laterality: Right;   COLON[place]     [place]  - polyps but given a 10 yr recall per pt    GANGLION CYST EXCISION     78 years old   INCISION AND DRAINAGE ABSCESS ANAL     KNEE SURGERY  2014   SHOULDER SURGERY  1976   TONSILLECTOMY     age 85   Social History   Socioeconomic History   Marital status: Divorced    Spouse name: Not on file   Number of children: 2   Years of education: Not on file   Highest education level: Not on file  Occupational History   Not on file  Tobacco Use   Smoking status: Former Smoker    Packs/day: 1.00    Years: 2.00    Pack years: 2.00    Types: Cigarettes   Smokeless tobacco: Never Used   Tobacco comment: quite smoker at age 68  Substance and Sexual Activity   Alcohol use: Yes    Alcohol/week: 0.0 - 1.0 standard drinks    Comment: 0-1 per month   Drug use: No   Sexual activity: Not Currently  Other Topics Concern   Not on file  Social History Narrative   Single.   Moved from [place]  to [place].   Family lives in [place].   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   Social Determinants of Health   Financial Resource Strain:    Difficulty of Paying Living Expenses: Not on file  Food Insecurity:    Worried About Programme Researcher, Broadcasting/film/video in the Last  Year: Not on file   [employer] of Food in the Last Year: Not on file  Transportation Needs:    Lack of Transportation (Medical): Not on file   Lack of Transportation (Non-Medical): Not on file  Physical Activity:    Days of Exercise per Week: Not on file   Minutes of Exercise per Session: Not on file  Stress:    Feeling of Stress : Not on file  Social Connections:    Frequency of Communication with Friends and Family: Not on file   Frequency of Social Gatherings with Friends and Family: Not on file   Attends Religious Services: Not on file   Active Member of Clubs or Organizations: Not on file   Attends Banker Meetings: Not on file   Marital Status: Not on file  Intimate Partner Violence:    Fear of Current or Ex-Partner: Not on file   Emotionally Abused: Not on file   Physically Abused: Not on file   Sexually Abused: Not on file   Current Outpatient Medications on File Prior to Visit  Medication Sig Dispense Refill   albuterol  (VENTOLIN  HFA) 108 (90 Base) MCG/ACT inhaler TAKE 2 PUFFS BY MOUTH EVERY 6 HOURS AS NEEDED FOR WHEEZE/SHORTNESS OF BREATH 8.5 each 1   anastrozole  (ARIMIDEX ) 1 MG tablet TAKE 1 TABLET BY MOUTH EVERY DAY 90 tablet 1   atorvastatin  (LIPITOR) 20 MG tablet TAKE 1 TABLET BY MOUTH EVERY DAY FOR CHOLESTEROL 90 tablet 2   BD PEN NEEDLE NANO 2ND GEN 32G X 4 MM MISC USE AS DIRECTED 3 TIMES A DAY 100 each 3   Continuous Blood Gluc Receiver (DEXCOM G6 RECEIVER) DEVI Use to monitor blood sugar 1 each 0   Continuous Glucose Sensor (DEXCOM G6 SENSOR) MISC USE AS INSTRUCTED CHANGE SENSOR EVERY 10 DAYS E11.65 9 each 3   Continuous Glucose Transmitter (DEXCOM G6 TRANSMITTER) MISC REPLACE EVERY 3 (THREE) MONTHS. 1 each 3   fluticasone  (FLONASE ) 50 MCG/ACT nasal spray INSTILL 1 SPRAY IN EACH NOSTRIL TWICE A DAY AS NEEDED FOR ALLERGIES OR RHINITIS 48 mL 2   gabapentin  (NEURONTIN ) 100 MG capsule Take 1-3 capsules by mouth once to twice daily for pain. 90 capsule 0    glucose blood (ONETOUCH VERIO) test strip Test blood sugar up to three times daily for diabetes. 300 strip 1   insulin  aspart (NOVOLOG ) 100 UNIT/ML injection INJECT up to 70 UNITS INTO THE SKIN ONCE FOR 1 DOSE. VIA PUMP 90 mL 3   Insulin  Disposable Pump (V-GO 30) 30 UNIT/24HR KIT 1 each by Does not apply route daily. 30 kit 5   insulin  lispro (HUMALOG  KWIKPEN) 100 UNIT/ML KwikPen INJECT 25 UNITS INTO THE SKIN 3 (THREE) TIMES DAILY WITH MEALS 15 mL PRN   levothyroxine  (SYNTHROID ) 88 MCG tablet TAKE 1 AND 1/2 TABLET BY MOUTH EVERY SUNDAY. TAKE 1 TABLET BY MOUTH MON THROUGH SAT. TAKE ON AN EMPTY STOMACH WITH WATER ONLY. NO FOOD OR OTHER MEDICATIONS FOR 30 MINUTES. 96 tablet 2   metFORMIN  (GLUCOPHAGE -XR) 500 MG 24 hr tablet TAKE 2 TABLETS (1,000 MG TOTAL) BY MOUTH DAILY WITH BREAKFAST. FOR DIABETES. 180 tablet 1   montelukast  (SINGULAIR ) 10 MG tablet TAKE 1 TAB BY MOUTH AT BEDTIME FOR ALLERGIES AND ASTHMA. 90 tablet 2   PREBIOTIC PRODUCT PO Take 1 capsule by mouth daily.     Semaglutide , 1 MG/DOSE, (OZEMPIC , 1 MG/DOSE,) 4 MG/3ML SOPN INJECT 1MG  INTO THE SKIN ONCE A WEEK 9 mL 4   WIXELA INHUB 250-50 MCG/ACT AEPB INHALE 1 PUFF BY MOUTH TWICE A DAY (Patient taking differently: Inhale 1 puff into the lungs daily.) 60 each 9   No current facility-administered medications on file prior to visit.   Allergies  Allergen  Reactions   Iodine-131 Anaphylaxis    Pt states when she was 21=she drank an experimental Iodinated Radioisotope for her thyroid . She described it was a small amount in a milk carton in [place] . She states they stopped using it shortly after because the trial wasn't successful. SPM   Prednisone      Make sugar jump way up.   Family History  Problem Relation Age of Onset   Alzheimer's disease Mother    Asthma Mother    Dementia Mother        cause of death   Arthritis Father    Heart attack Father 42       cause of death   Breast cancer Sister 100   Healthy Sister    Healthy Brother     Liver cancer Maternal Uncle    Colon cancer Paternal Grandmother    Healthy Daughter    Healthy Daughter    Colon polyps Neg Hx    Rectal cancer Neg Hx    Stomach cancer Neg Hx    Esophageal cancer Neg Hx    PE: BP 130/74   Pulse 78   Ht 5' 5 (1.651 m)   Wt 234 lb 6.4 oz (106.3 kg)   SpO2 96%   BMI 39.01 kg/m   Wt Readings from Last 10 Encounters:  03/24/23 234 lb 6.4 oz (106.3 kg)  03/24/23 234 lb 8 oz (106.4 kg)  02/11/23 222 lb 2 oz (100.8 kg)  01/28/23 214 lb (97.1 kg)  01/13/23 214 lb (97.1 kg)  12/23/22 214 lb (97.1 kg)  12/16/22 218 lb (98.9 kg)  11/20/22 215 lb (97.5 kg)  11/18/22 217 lb 9.6 oz (98.7 kg)  11/11/22 217 lb (98.4 kg)   Constitutional: overweight, in NAD Eyes:  EOMI, no exophthalmos ENT: no neck masses, no cervical lymphadenopathy Cardiovascular: RRR, No MRG Respiratory: CTA B Musculoskeletal: no deformities Skin:no rashes Neurological: no tremor with outstretched hands  ASSESSMENT: 1. DM2, insulin -dependent, uncontrolled, without long-term complications, but with hyperglycemia  2. HL  3. Obesity class 1  PLAN:  1. Patient with longstanding, insulin -dependent type 2 diabetes, on metformin , weekly GLP-1 receptor agonist and insulin  in the VGo mechanical pump, with still poor control and significant fluctuation in blood sugars.  At last visit, HbA1c was better, at 7.4%.  At that time sugars appears to be improved fluctuating mostly within the target range but with significant increases after meals.  They were quite high in the first half of the night and improving before 6 AM.  We discussed about increasing the insulin  in her pump so we switched from VGo20 to the VGo30 device.  We did discuss that if the sugars started to drop afterwards, to reduce her mealtime boluses by 1-2 clicks per meal.  We did not change the rest of the regimen. CGM interpretation: -At today's visit, we reviewed her CGM downloads: It appears that 55% of values are in  target range (goal >70%), while 43% are higher than 180 (goal <25%), and 2% are lower than 70 (goal <4%).  The calculated average blood sugar is 177.  The projected HbA1c for the next 3 months (GMI) is 7.5%. -Reviewing the CGM trends, sugars appear to be quite high postprandially, with improvement in blood sugars between meals.  We discussed that this can appear in 2 situations: Either not enough insulin  before meals or taking insulin  too late.  She admits that she forgets to take insulin  15-minute before the meals and only remembers after she is  a regular continue.  This is likely the cause for her hyperglycemic postprandial excursions and the decrease in blood sugars afterwards.  We discussed about possibly reaching to an ultra-rapid acting insulin  like Fiasp .  I sent a prescription for this to her pharmacy.  This is injected at the start of the meal.  We discussed about the fact that this may not be available since there is a sport and exercise psychologist of it, and in that case, it.  Continues on NovoLog  she needs to set an alarm to take it approximately 15 minutes before meals.  Unfortunately, on the VGo, we cannot increase the doses of insulin  before the meals.  However, she does corrections with the Humalog  pen, after the meals.  We discussed that if the sugars are consistently elevated after certain meals, she can inject the correction preemptively, before the meal.  However, I am hoping that she does not end up needing to do this as this can get quite confusing.  Another option would be to come off the pump, which for now I would like to avoid. -She forgets to take metformin  in the morning and we discussed that she can take it either in the morning or with dinner. - I suggested to:  Patient Instructions  Please continue: - Metformin  ER 1000 mg with breakfast or dinner - Ozempic  1 mg weekly  Try to change from NovoLog  to Fiasp  - start the boluses at the start of the meal: - VGo30: 6-7 clicks before b'fast and  lunch; for dinner, take 7-8 clicks If sugars before meals <60, do not take Novolog  before that meal. If sugars before meals <90, try to move the bolus right before the meal   Please return in 3-4 months.   - we checked her HbA1c: 7.3% (lower) - advised to check sugars at different times of the day - 4x a day, rotating check times - advised for yearly eye exams >> she is UTD - return to clinic in 3-4 months  2. HL -Latest lipid panel from 11/2022 was reviewed: At goal: Lab Results  Component Value Date   CHOL 182 11/20/2022   HDL 78.40 11/20/2022   LDLCALC 85 11/20/2022   TRIG 93.0 11/20/2022   CHOLHDL 2 11/20/2022  -She continues Lipitor 20 mg daily without side effects  3.  Obesity class II -She continues on Ozempic  which should also help with weight loss -She was previously on a plant-based diet but she felt that this did not help with her diabetes control so she is now on amnio p.o. diet. -She gained 11 pounds before last visit -She gained another 17 pounds since  Lela Fendt, MD PhD [facility] Endocrinology

## 2023-03-24 NOTE — Patient Instructions (Addendum)
 Please continue: - Metformin  ER 1000 mg with breakfast or dinner - Ozempic  1 mg weekly  Try to change from NovoLog  to Fiasp  - start the boluses at the start of the meal: - VGo30: 6-7 clicks before b'fast and lunch; for dinner, take 7-8 clicks If sugars before meals <60, do not take Novolog  before that meal. If sugars before meals <90, try to move the bolus right before the meal   Please return in 3-4 months.

## 2023-03-24 NOTE — Progress Notes (Signed)
 Patient ID: Molly Wall, female    DOB: [birth year], 78 y.o.   MRN: [id number]  This visit was conducted in person.  BP 130/60 (BP Location: Left Arm, Patient Position: Sitting, Cuff Size: Large)   Pulse 79   Temp 98.2 F (36.8 C) (Temporal)   Ht 5' 5 (1.651 m)   Wt 234 lb 8 oz (106.4 kg)   SpO2 99%   BMI 39.02 kg/m    CC:  Chief Complaint  Patient presents with   Cough    Started 2 weeks ago   Nasal Congestion   Post Nasal Drip   Fatigue         Subjective:   HPI: Molly Wall is a 78 y.o. female patient of Mallie Gaskins, NP with history of asthma and diabetes presenting on 03/24/2023 for Cough (Started 2 weeks ago), Nasal Congestion, Post Nasal Drip, and Fatigue (/)   11/27 she was treated for viral bronchitis x 3 weeks... resolved.. treated with  Augmentin  Dr. Oneta treating chronic sinus infection. Date of onset:  2 weeks Initial symptoms included  nasal congestion, post nasal drip Symptoms progressed to  dry cough.   She is feeling better overall but is still very tired.  Still post nasal drip and clearing throat.. clear nasal and post nasal drip.  Occ dizziness.  No fever.   NO SOB, no wheeze    CT scan pansinusitis  Sinus pressure in ears remains.  Has follow up appt with Dr. Tobie along with repeat CT scan.  Sick contacts:  none COVID testing:   none     She has history of asthma, moderate persistent maintained on Wixela 1 puff twice daily and Singulair  10 mg daily.  She treats allergies with Flonase  2 sprays per nostril daily Non-smoker.   Followed by endo Lab Results  Component Value Date   HGBA1C 7.4 (A) 11/18/2022    She request lab work  [facility] today.     Relevant past medical, surgical, family and social history reviewed and updated as indicated. Interim medical history since our last visit reviewed. Allergies and medications reviewed and updated. Outpatient Medications Prior to Visit  Medication Sig  Dispense Refill   albuterol  (VENTOLIN  HFA) 108 (90 Base) MCG/ACT inhaler TAKE 2 PUFFS BY MOUTH EVERY 6 HOURS AS NEEDED FOR WHEEZE/SHORTNESS OF BREATH 8.5 each 1   anastrozole  (ARIMIDEX ) 1 MG tablet TAKE 1 TABLET BY MOUTH EVERY DAY 90 tablet 1   atorvastatin  (LIPITOR) 20 MG tablet TAKE 1 TABLET BY MOUTH EVERY DAY FOR CHOLESTEROL 90 tablet 2   BD PEN NEEDLE NANO 2ND GEN 32G X 4 MM MISC USE AS DIRECTED 3 TIMES A DAY 100 each 3   Continuous Blood Gluc Receiver (DEXCOM G6 RECEIVER) DEVI Use to monitor blood sugar 1 each 0   Continuous Glucose Sensor (DEXCOM G6 SENSOR) MISC USE AS INSTRUCTED CHANGE SENSOR EVERY 10 DAYS E11.65 9 each 3   Continuous Glucose Transmitter (DEXCOM G6 TRANSMITTER) MISC REPLACE EVERY 3 (THREE) MONTHS. 1 each 3   fluticasone  (FLONASE ) 50 MCG/ACT nasal spray INSTILL 1 SPRAY IN EACH NOSTRIL TWICE A DAY AS NEEDED FOR ALLERGIES OR RHINITIS 48 mL 2   gabapentin  (NEURONTIN ) 100 MG capsule Take 1-3 capsules by mouth once to twice daily for pain. 90 capsule 0   glucose blood (ONETOUCH VERIO) test strip Test blood sugar up to three times daily for diabetes. 300 strip 1   insulin  aspart (NOVOLOG ) 100 UNIT/ML injection  INJECT up to 70 UNITS INTO THE SKIN ONCE FOR 1 DOSE. VIA PUMP 90 mL 3   Insulin  Disposable Pump (V-GO 30) 30 UNIT/24HR KIT 1 each by Does not apply route daily. 30 kit 5   insulin  lispro (HUMALOG  KWIKPEN) 100 UNIT/ML KwikPen INJECT 25 UNITS INTO THE SKIN 3 (THREE) TIMES DAILY WITH MEALS 15 mL PRN   levothyroxine  (SYNTHROID ) 88 MCG tablet TAKE 1 AND 1/2 TABLET BY MOUTH EVERY SUNDAY. TAKE 1 TABLET BY MOUTH MON THROUGH SAT. TAKE ON AN EMPTY STOMACH WITH WATER ONLY. NO FOOD OR OTHER MEDICATIONS FOR 30 MINUTES. 96 tablet 2   metFORMIN  (GLUCOPHAGE -XR) 500 MG 24 hr tablet TAKE 2 TABLETS (1,000 MG TOTAL) BY MOUTH DAILY WITH BREAKFAST. FOR DIABETES. 180 tablet 1   montelukast  (SINGULAIR ) 10 MG tablet TAKE 1 TAB BY MOUTH AT BEDTIME FOR ALLERGIES AND ASTHMA. 90 tablet 2   PREBIOTIC  PRODUCT PO Take 1 capsule by mouth daily.     Semaglutide , 1 MG/DOSE, (OZEMPIC , 1 MG/DOSE,) 4 MG/3ML SOPN INJECT 1MG  INTO THE SKIN ONCE A WEEK 9 mL 4   WIXELA INHUB 250-50 MCG/ACT AEPB INHALE 1 PUFF BY MOUTH TWICE A DAY (Patient taking differently: Inhale 1 puff into the lungs daily.) 60 each 9   azelastine  (ASTELIN ) 0.1 % nasal spray Place 2 sprays into both nostrils 2 (two) times daily. Use in each nostril as directed 30 mL 12   benzonatate  (TESSALON ) 200 MG capsule Take 1 capsule (200 mg total) by mouth 3 (three) times daily as needed for cough. Swallow whole 20 capsule 0   meclizine  (ANTIVERT ) 12.5 MG tablet Take 1 tablet (12.5 mg total) by mouth 3 (three) times daily as needed for dizziness. 30 tablet 0   No facility-administered medications prior to visit.     Per HPI unless specifically indicated in ROS section below Review of Systems  Constitutional:  Negative for fatigue, fever and unexpected weight change.  HENT:  Positive for congestion and postnasal drip. Negative for ear pain, sinus pressure, sneezing, sore throat and trouble swallowing.   Eyes:  Negative for pain and itching.  Respiratory:  Negative for cough, shortness of breath and wheezing.   Cardiovascular:  Negative for chest pain, palpitations and leg swelling.  Gastrointestinal:  Negative for abdominal pain, blood in stool, constipation, diarrhea and nausea.  Genitourinary:  Negative for difficulty urinating, dysuria, hematuria, menstrual problem, vaginal bleeding and vaginal discharge.  Musculoskeletal:  Negative for back pain.  Skin:  Negative for rash.  Neurological:  Positive for dizziness. Negative for syncope, weakness, light-headedness, numbness and headaches.  Psychiatric/Behavioral:  Negative for confusion and dysphoric mood. The patient is not nervous/anxious.    Objective:  BP 130/60 (BP Location: Left Arm, Patient Position: Sitting, Cuff Size: Large)   Pulse 79   Temp 98.2 F (36.8 C) (Temporal)   Ht 5'  5 (1.651 m)   Wt 234 lb 8 oz (106.4 kg)   SpO2 99%   BMI 39.02 kg/m   Wt Readings from Last 3 Encounters:  03/24/23 234 lb 8 oz (106.4 kg)  02/11/23 222 lb 2 oz (100.8 kg)  01/28/23 214 lb (97.1 kg)      Physical Exam Constitutional:      General: She is not in acute distress.    Appearance: Normal appearance. She is well-developed. She is not ill-appearing or toxic-appearing.  HENT:     Head: Normocephalic.     Right Ear: Hearing, tympanic membrane, ear canal and external ear normal. Tympanic membrane  is not erythematous, retracted or bulging.     Left Ear: Hearing, tympanic membrane, ear canal and external ear normal. Tympanic membrane is not erythematous, retracted or bulging.     Nose: No mucosal edema or rhinorrhea.     Right Sinus: No maxillary sinus tenderness or frontal sinus tenderness.     Left Sinus: No maxillary sinus tenderness or frontal sinus tenderness.     Mouth/Throat:     Pharynx: Uvula midline.  Eyes:     General: Lids are normal. Lids are everted, no foreign bodies appreciated.     Conjunctiva/sclera: Conjunctivae normal.     Pupils: Pupils are equal, round, and reactive to light.  Neck:     Thyroid : No thyroid  mass or thyromegaly.     Vascular: No carotid bruit.     Trachea: Trachea normal.  Cardiovascular:     Rate and Rhythm: Normal rate and regular rhythm.     Pulses: Normal pulses.     Heart sounds: Normal heart sounds, S1 normal and S2 normal. No murmur heard.    No friction rub. No gallop.  Pulmonary:     Effort: Pulmonary effort is normal. No tachypnea or respiratory distress.     Breath sounds: Normal breath sounds. No decreased breath sounds, wheezing, rhonchi or rales.  Abdominal:     General: Bowel sounds are normal.     Palpations: Abdomen is soft.     Tenderness: There is no abdominal tenderness.  Musculoskeletal:     Cervical back: Normal range of motion and neck supple.  Skin:    General: Skin is warm and dry.     Findings: No  rash.  Neurological:     Mental Status: She is alert.  Psychiatric:        Mood and Affect: Mood is not anxious or depressed.        Speech: Speech normal.        Behavior: Behavior normal. Behavior is cooperative.        Thought Content: Thought content normal.        Judgment: Judgment normal.       Results for orders placed or performed in visit on 02/11/23  POC COVID-19 BinaxNow   Collection Time: 02/11/23  4:46 PM  Result Value Ref Range   SARS Coronavirus 2 Ag Negative Negative    Assessment and Plan  Recurrent infections Assessment & Plan: Discussed factors in detail that could be contributing to recent infections.  She is concerned about why she cannot seem to get better. Discussed age and diabetes contributing to infection issues. Today there is no clear viral or obvious bacterial infection but there is still some concern for chronic sinusitis.  Will evaluate with CBC  Orders: -     CBC with Differential/Platelet  Type 2 diabetes mellitus with hyperglycemia, with long-term current use of insulin  [facility]) Assessment & Plan: Chronic, labile control.  Patient requests labs drawn today including A1c, complete metabolic panel and lipid panel.  She has appointment with endocrinology today  Orders: -     Hemoglobin A1c -     Lipid panel -     Comprehensive metabolic panel  Chronic pansinusitis Assessment & Plan: Some continued symptoms possibly secondary to environmental allergies versus persistent chronic pansinusitis.  She has scheduled CT scan of sinuses followed by appointment with Dr. Tobie, ENT.     No follow-ups on file.   Greig Ring, MD

## 2023-03-25 ENCOUNTER — Other Ambulatory Visit: Payer: Self-pay | Admitting: Internal Medicine

## 2023-03-26 ENCOUNTER — Ambulatory Visit (INDEPENDENT_AMBULATORY_CARE_PROVIDER_SITE_OTHER): Payer: Medicare Other

## 2023-04-01 ENCOUNTER — Telehealth: Payer: Self-pay

## 2023-04-01 ENCOUNTER — Other Ambulatory Visit (HOSPITAL_COMMUNITY): Payer: Self-pay

## 2023-04-01 NOTE — Telephone Encounter (Signed)
 Pharmacy Patient Advocate Encounter   Received notification from Pt Calls Messages that prior authorization for fiasp  is required/requested.   Insurance verification completed.   The patient is insured through [employer] .   Per test claim: PA required; PA submitted to above mentioned insurance via CoverMyMeds Key/confirmation #/EOC [id number] Status is pending

## 2023-04-01 NOTE — Telephone Encounter (Signed)
 Pt needs a PA for Fiasp  per Provider.

## 2023-04-06 ENCOUNTER — Ambulatory Visit (HOSPITAL_COMMUNITY): Payer: Medicare Other

## 2023-04-09 ENCOUNTER — Ambulatory Visit (HOSPITAL_COMMUNITY)
Admission: RE | Admit: 2023-04-09 | Discharge: 2023-04-09 | Disposition: A | Discharge status: Home / Self Care | Payer: Medicare Other | Source: Ambulatory Visit | Attending: Otolaryngology | Admitting: Otolaryngology

## 2023-04-09 DIAGNOSIS — J324 Chronic pansinusitis: Secondary | ICD-10-CM | POA: Insufficient documentation

## 2023-04-16 ENCOUNTER — Telehealth: Payer: Self-pay | Admitting: Rheumatology

## 2023-04-16 NOTE — Telephone Encounter (Signed)
Pt would like to schedule an appointment to get an injection in her knee. I asked pt if she meant a Cortizone or gel injections. Pt did not know which one and stated she was diabetic

## 2023-04-16 NOTE — Telephone Encounter (Signed)
Okay to apply for viscosupplement injections.  Patient is diabetic.  We obtained x-rays at the last visit.

## 2023-04-20 ENCOUNTER — Ambulatory Visit (INDEPENDENT_AMBULATORY_CARE_PROVIDER_SITE_OTHER): Payer: Medicare Other | Admitting: Family Medicine

## 2023-04-20 ENCOUNTER — Encounter: Payer: Self-pay | Admitting: Family Medicine

## 2023-04-20 VITALS — BP 134/66 | HR 100 | Temp 99.3°F | Ht 65.0 in | Wt 232.4 lb

## 2023-04-20 DIAGNOSIS — R1013 Epigastric pain: Secondary | ICD-10-CM | POA: Insufficient documentation

## 2023-04-20 DIAGNOSIS — E1165 Type 2 diabetes mellitus with hyperglycemia: Secondary | ICD-10-CM

## 2023-04-20 DIAGNOSIS — J329 Chronic sinusitis, unspecified: Secondary | ICD-10-CM | POA: Diagnosis not present

## 2023-04-20 DIAGNOSIS — R058 Other specified cough: Secondary | ICD-10-CM

## 2023-04-20 DIAGNOSIS — Z794 Long term (current) use of insulin: Secondary | ICD-10-CM

## 2023-04-20 DIAGNOSIS — J3489 Other specified disorders of nose and nasal sinuses: Secondary | ICD-10-CM | POA: Diagnosis not present

## 2023-04-20 DIAGNOSIS — J4520 Mild intermittent asthma, uncomplicated: Secondary | ICD-10-CM

## 2023-04-20 DIAGNOSIS — R051 Acute cough: Secondary | ICD-10-CM

## 2023-04-20 LAB — LIPASE: Lipase: 7 U/L — ABNORMAL LOW (ref 11.0–59.0)

## 2023-04-20 LAB — POCT INFLUENZA A/B
Influenza A, POC: NEGATIVE
Influenza B, POC: NEGATIVE

## 2023-04-20 LAB — POC COVID19 BINAXNOW: SARS Coronavirus 2 Ag: NEGATIVE

## 2023-04-20 MED ORDER — AMOXICILLIN-POT CLAVULANATE 875-125 MG PO TABS
1.0000 | ORAL_TABLET | Freq: Two times a day (BID) | ORAL | 0 refills | Status: AC
Start: 2023-04-20 — End: 2023-04-30

## 2023-04-20 NOTE — Assessment & Plan Note (Signed)
Not in exacerbation at this time - continue wixela, singulair, flonase and PRN albuterol

## 2023-04-20 NOTE — Assessment & Plan Note (Signed)
Some nausea, gassiness, indigestion, epigastric pressure in Ozempic use.  Discussed gas-x use as well as trial probiotic.  Rec limiting gas producing foods, list provided.  Will check lipase r/o pancreatitis.

## 2023-04-20 NOTE — Assessment & Plan Note (Signed)
Chronic, brittle diabetes  with fluctuating levels followed by endo. Avoid prednisone due to this

## 2023-04-20 NOTE — Assessment & Plan Note (Signed)
Acute exacerbation of chronic issue. Recent CT sinuses showed pansinusitis.  Ongoing worsening of symptoms for 5 days. Check COVID, flu swabs (negative) and RSV swab (pending).  Rx augmentin 10d course for possible bacterial sinusitis component. Encouraged she call ENT for f/u.

## 2023-04-20 NOTE — Progress Notes (Addendum)
Ph: [phone] Fax: [phone]   Patient ID: Molly Wall, female    DOB: [birth year], 78 y.o.   MRN: [id number]  This visit was conducted in person.  BP 134/66   Pulse 100   Temp 99.3 F (37.4 C) (Oral)   Ht 5\' 5"  (1.651 m)   Wt 232 lb 6 oz (105.4 kg)   SpO2 93%   BMI 38.67 kg/m    CC: cough Subjective:   HPI: Molly Wall is a 78 y.o. female presenting on 04/20/2023 for Cough (C/o cough, HA, fatigue, head/chest congestion, chills and facial pain. Sxs started 04/16/23. )   Pleasant patient of Dr Ophelia Charter with h/o breast cancer, insulin dependent type 2 diabetes followed by endocrinology, hypothyroid, OSA on CPAP, asthma on Wixela singulair and flonase, sleep apnea presents with below concerns.  Lab Results  Component Value Date   HGBA1C 7.3 (A) 03/24/2023    4-5d h/o fatigue, 2d developed productive cough of clear to brown phlegm, bad headache, fatigue, head and chest congestion, chills and ear and facial pain, chest burning and felt feverish. PNdrainage. Some exertional dyspnea without wheeze. Notes increased mucous production and rhinorrhea.   Daughter has been sick, as well as her renter.  No fevers, tooth pain, ST.  So far has tried nothing for symptoms. Asthmatic - did try albuterol for this.    Recent infections: Sees [facility] Dr Allena Katz treating chronic pansinusitis .  01/2023 - viral bronchitis with chronic sinusitis treated with augmentin.   By the way - increased constipation recently, also notes increased gassiness and epigastric pressure sensation. No anorexia, boring pain to back. She is on ozempic 1mg  weekly. Suggested gas-x (simethicone).       Relevant past medical, surgical, family and social history reviewed and updated as indicated. Interim medical history since our last visit reviewed. Allergies and medications reviewed and updated. Outpatient Medications Prior to Visit  Medication Sig Dispense Refill   albuterol  (VENTOLIN HFA) 108 (90 Base) MCG/ACT inhaler TAKE 2 PUFFS BY MOUTH EVERY 6 HOURS AS NEEDED FOR WHEEZE/SHORTNESS OF BREATH 8.5 each 1   anastrozole (ARIMIDEX) 1 MG tablet TAKE 1 TABLET BY MOUTH EVERY DAY 90 tablet 1   atorvastatin (LIPITOR) 20 MG tablet TAKE 1 TABLET BY MOUTH EVERY DAY FOR CHOLESTEROL 90 tablet 2   BD PEN NEEDLE NANO 2ND GEN 32G X 4 MM MISC USE AS DIRECTED 3 TIMES A DAY 100 each 3   Continuous Blood Gluc Receiver (DEXCOM G6 RECEIVER) DEVI Use to monitor blood sugar 1 each 0   Continuous Glucose Sensor (DEXCOM G6 SENSOR) MISC USE AS INSTRUCTED CHANGE SENSOR EVERY 10 DAYS E11.65 9 each 3   Continuous Glucose Transmitter (DEXCOM G6 TRANSMITTER) MISC REPLACE EVERY 3 (THREE) MONTHS. 1 each 3   FIASP 100 UNIT/ML SOLN USE UP TO 80 UNITS IN THE INSULIN PUMP DAILY 30 mL 3   fluticasone (FLONASE) 50 MCG/ACT nasal spray INSTILL 1 SPRAY IN EACH NOSTRIL TWICE A DAY AS NEEDED FOR ALLERGIES OR RHINITIS 48 mL 2   gabapentin (NEURONTIN) 100 MG capsule Take 1-3 capsules by mouth once to twice daily for pain. 90 capsule 0   glucose blood (ONETOUCH VERIO) test strip Test blood sugar up to three times daily for diabetes. 300 strip 1   Insulin Disposable Pump (V-GO 30) 30 UNIT/24HR KIT 1 each by Does not apply route daily. 30 kit 5   insulin lispro (HUMALOG KWIKPEN) 100 UNIT/ML KwikPen INJECT 25 UNITS INTO THE  SKIN 3 (THREE) TIMES DAILY WITH MEALS 15 mL PRN   levothyroxine (SYNTHROID) 88 MCG tablet TAKE 1 AND 1/2 TABLET BY MOUTH EVERY SUNDAY. TAKE 1 TABLET BY MOUTH MON THROUGH SAT. TAKE ON AN EMPTY STOMACH WITH WATER ONLY. NO FOOD OR OTHER MEDICATIONS FOR 30 MINUTES. 96 tablet 2   metFORMIN (GLUCOPHAGE-XR) 500 MG 24 hr tablet TAKE 2 TABLETS (1,000 MG TOTAL) BY MOUTH DAILY WITH BREAKFAST. FOR DIABETES. 180 tablet 1   montelukast (SINGULAIR) 10 MG tablet TAKE 1 TAB BY MOUTH AT BEDTIME FOR ALLERGIES AND ASTHMA. 90 tablet 2   PREBIOTIC PRODUCT PO Take 1 capsule by mouth daily.     Semaglutide, 1 MG/DOSE,  (OZEMPIC, 1 MG/DOSE,) 4 MG/3ML SOPN INJECT 1MG  INTO THE SKIN ONCE A WEEK 9 mL 4   WIXELA INHUB 250-50 MCG/ACT AEPB INHALE 1 PUFF BY MOUTH TWICE A DAY (Patient taking differently: Inhale 1 puff into the lungs daily.) 60 each 9   insulin aspart (NOVOLOG) 100 UNIT/ML injection INJECT up to 70 UNITS INTO THE SKIN ONCE FOR 1 DOSE. VIA PUMP 90 mL 3   No facility-administered medications prior to visit.     Per HPI unless specifically indicated in ROS section below Review of Systems  Objective:  BP 134/66   Pulse 100   Temp 99.3 F (37.4 C) (Oral)   Ht 5\' 5"  (1.651 m)   Wt 232 lb 6 oz (105.4 kg)   SpO2 93%   BMI 38.67 kg/m   Wt Readings from Last 3 Encounters:  04/20/23 232 lb 6 oz (105.4 kg)  03/24/23 234 lb 6.4 oz (106.3 kg)  03/24/23 234 lb 8 oz (106.4 kg)      Physical Exam Vitals and nursing note reviewed.  Constitutional:      Appearance: Normal appearance. She is not ill-appearing.  HENT:     Head: Normocephalic and atraumatic.     Right Ear: Tympanic membrane, ear canal and external ear normal. There is no impacted cerumen.     Left Ear: Tympanic membrane, ear canal and external ear normal. There is no impacted cerumen.     Nose:     Right Sinus: No maxillary sinus tenderness or frontal sinus tenderness.     Left Sinus: No maxillary sinus tenderness or frontal sinus tenderness.     Comments: Wearing mask Mild facial pain diffusely to percussion    Mouth/Throat:     Mouth: Mucous membranes are moist.     Pharynx: Oropharynx is clear. Posterior oropharyngeal erythema (mild oropharyngeal without exudates) present. No oropharyngeal exudate.  Eyes:     Extraocular Movements: Extraocular movements intact.     Conjunctiva/sclera: Conjunctivae normal.     Pupils: Pupils are equal, round, and reactive to light.  Cardiovascular:     Rate and Rhythm: Normal rate and regular rhythm.     Pulses: Normal pulses.     Heart sounds: Murmur (2/6 systolic RUSB) heard.  Pulmonary:      Effort: Pulmonary effort is normal. No respiratory distress.     Breath sounds: Normal breath sounds. No wheezing, rhonchi or rales.     Comments: Lungs clear Musculoskeletal:     Cervical back: Normal range of motion and neck supple.  Lymphadenopathy:     Head:     Right side of head: No submental, submandibular, tonsillar, preauricular or posterior auricular adenopathy.     Left side of head: No submental, submandibular, tonsillar, preauricular or posterior auricular adenopathy.     Cervical: No cervical adenopathy.  Right cervical: No superficial cervical adenopathy.    Left cervical: No superficial cervical adenopathy.     Upper Body:     Right upper body: No supraclavicular adenopathy.     Left upper body: No supraclavicular adenopathy.  Skin:    Findings: No rash.  Neurological:     Mental Status: She is alert.  Psychiatric:        Mood and Affect: Mood normal.        Behavior: Behavior normal.       Results for orders placed or performed in visit on 04/20/23  POC COVID-19 BinaxNow   Collection Time: 04/20/23 10:02 AM  Result Value Ref Range   SARS Coronavirus 2 Ag Negative Negative  POCT Influenza A/B   Collection Time: 04/20/23 10:02 AM  Result Value Ref Range   Influenza A, POC Negative Negative   Influenza B, POC Negative Negative    Assessment & Plan:   Problem List Items Addressed This Visit     Asthma   Not in exacerbation at this time - continue wixela, singulair, flonase and PRN albuterol       Type 2 diabetes mellitus with hyperglycemia, with long-term current use of insulin (HCC)   Chronic, brittle diabetes  with fluctuating levels followed by endo. Avoid prednisone due to this       Productive cough   Chronic sinusitis - Primary   Acute exacerbation of chronic issue. Recent CT sinuses showed pansinusitis.  Ongoing worsening of symptoms for 5 days. Check COVID, flu swabs (negative) and RSV swab (pending).  Rx augmentin 10d course for  possible bacterial sinusitis component. Encouraged she call ENT for f/u.       Relevant Medications   amoxicillin-clavulanate (AUGMENTIN) 875-125 MG tablet   Epigastric discomfort   Some nausea, gassiness, indigestion, epigastric pressure in Ozempic use.  Discussed gas-x use as well as trial probiotic.  Rec limiting gas producing foods, list provided.  Will check lipase r/o pancreatitis.       Relevant Orders   Lipase   Other Visit Diagnoses       Acute cough       Relevant Orders   POC COVID-19 BinaxNow (Completed)   POCT Influenza A/B (Completed)     Rhinorrhea       Relevant Orders   RSV screen (nasopharyngeal)not at [facility]        Meds ordered this encounter  Medications   amoxicillin-clavulanate (AUGMENTIN) 875-125 MG tablet    Sig: Take 1 tablet by mouth 2 (two) times daily for 10 days.    Dispense:  20 tablet    Refill:  0    Orders Placed This Encounter  Procedures   RSV screen (nasopharyngeal)not at [facility]   Lipase   POC COVID-19 BinaxNow    Previously tested for COVID-19:   Yes    Resident in a congregate (group) care setting:   No    Employed in healthcare setting:   No    Pregnant:   No   POCT Influenza A/B    Patient Instructions  Your CT showed significant mucosal inflammation/obstruction with chronic sinusitis - follow up with ENT about sinusitis.  Flu and COVID swabs returned normal. One more RSV swab today.  Take augmentin antibiotic for chronic sinusitis symptoms.   Lab today to check pancreas.  Try Gas-X (simethicone) as needed.  Limit gas producing foods (beans, onions, celery, carrots, raisins, bananas, apricots, prunes, brussel sprouts, wheat germ, pretzels). Could try probiotic like [place] or [employer]  or [employer] colon health pills.  Ok to continue pepcid for indigestion/reflux.   Follow up plan: Return if symptoms worsen or fail to improve.  Eustaquio Boyden, MD

## 2023-04-20 NOTE — Patient Instructions (Addendum)
Your CT showed significant mucosal inflammation/obstruction with chronic sinusitis - follow up with ENT about sinusitis.  Flu and COVID swabs returned normal. One more RSV swab today.  Take augmentin antibiotic for chronic sinusitis symptoms.   Lab today to check pancreas.  Try Gas-X (simethicone) as needed.  Limit gas producing foods (beans, onions, celery, carrots, raisins, bananas, apricots, prunes, brussel sprouts, wheat germ, pretzels). Could try probiotic like [place] or [employer] or [employer] colon health pills.  Ok to continue pepcid for indigestion/reflux.

## 2023-04-21 ENCOUNTER — Ambulatory Visit: Payer: Medicare Other | Admitting: Rheumatology

## 2023-04-21 ENCOUNTER — Encounter: Payer: Self-pay | Admitting: Family Medicine

## 2023-04-21 ENCOUNTER — Ambulatory Visit: Payer: Self-pay | Admitting: Primary Care

## 2023-04-21 LAB — RSV SCREEN (NASOPHARYNGEAL) NOT AT ARMC
MICRO NUMBER:: 16032864
RESULT:: NOT DETECTED
SPECIMEN QUALITY:: ADEQUATE

## 2023-04-21 NOTE — Telephone Encounter (Signed)
VOB submitted for Orthovisc, Bilateral knee(s) BV pending 

## 2023-04-21 NOTE — Telephone Encounter (Signed)
 Additional Notes: Pt calling for clarification about the result of her lipase drawn yesterday at an office visit. This RN shared with pt Dr. Talmadge message about the lab result. Pt is concerned, however, and states that it appears the lipase is low. This RN advised pt RN would relay concern to the appropriate people for follow-up and clarification.  Pt reports headache, nausea, blurry vision, gas, abdominal pain sometimes, and blurry vision. Pt states none of these symptoms are new. Pt states she has taken the prescribed Augmentin  twice now (once yesterday, once today) and feels slightly better, but still feels poorly overall. RN advised pt to call back if she begins feeling worse or if any new symptoms arise.   Copied from CRM [id number]. Topic: Clinical - Lab/Test Results >> Apr 21, 2023 12:09 PM Molly Wall wrote: Reason for CRM: Patient has further questions about recent lab results Reason for Disposition  General information question, no triage required and triager able to answer question  Answer Assessment - Initial Assessment Questions 1. REASON FOR CALL or QUESTION: What is your reason for calling today? or How can I best help you? or What question do you have that I can help answer?     Pt wants more information about her lipase lab result. RN shared with pt Dr. Talmadge note about her lipase. Pt still wanting more information, asks if she needs to be taking Wall different medication for her lipase. RN advised pt she will relay concerns to Dr. KANDICE for follow-up. Pt reports cough, blurry vision, nausea, headache, gas, abdominal pain sometimes, constipation. Prescribed Augmentin  yesterday and has taken it twice. Pt states she feels somewhat better than yesterday, but still feels quite sick. Pt states she feels no worse than she did before she saw Dr. KANDICE yesterday.  Protocols used: Information Only Call - No Triage-Wall-AH

## 2023-04-21 NOTE — Telephone Encounter (Signed)
Noted.  Keep taking Augmentin as prescribed. Lipase level was fine.  Did she ever schedule an appointment with ENT for dizziness?  We referred her back in September 2024.

## 2023-04-21 NOTE — Telephone Encounter (Signed)
 Called and spoke with patient, advised of Meredith message. Patient stated she saw ENT they gave her 2 weeks of antibiotics and then wanted her to do CAT scan and f/u in office. Pt has had CAT scan done, but has been sick recently and hasn't had f/u in office yet.

## 2023-04-21 NOTE — Telephone Encounter (Signed)
 Please call to schedule visco injections.  Approved for Orthovisc, Bilateral knee(s). [employer] Deductible applies Once the deductible has been met $257 (met $236.08) patient is responsible for a 20% coinsurance Prior authorization is not required Mzq:[id number]  Secondary plan follows Medicare guidelines.  It will pick up remaining eligible expenses at 100% Ref: [id number]

## 2023-04-21 NOTE — Telephone Encounter (Signed)
 Noted

## 2023-04-23 ENCOUNTER — Other Ambulatory Visit: Payer: Self-pay | Admitting: Family Medicine

## 2023-04-23 ENCOUNTER — Telehealth: Payer: Self-pay

## 2023-04-23 DIAGNOSIS — J45909 Unspecified asthma, uncomplicated: Secondary | ICD-10-CM

## 2023-04-23 NOTE — Telephone Encounter (Signed)
 It looks like she was following with pulmonology for sleep apnea.  They prescribed her CPAP machine.

## 2023-04-23 NOTE — Telephone Encounter (Signed)
 Called and advised patient of Onalee Bien message.

## 2023-04-23 NOTE — Telephone Encounter (Signed)
 Pharmacy Patient Advocate Encounter  Received notification from [facility] that Prior Authorization for Fiasp  has been DENIED.  Full denial letter will be uploaded to the media tab. See denial reason below.   Called the pharmacy to have them process through part B. They will call her once they get it ready.

## 2023-04-23 NOTE — Telephone Encounter (Signed)
 Pt was seen by Dr. KANDICE on 04/20/23 and was treated with Augmentin  BID for 10 days. Patient states she has been experiencing stomach cramps and a lot of gas from taking the abx. Cramps started yesterday, woke her up in the middle of the night. Patient has been drinking milk and eating bread. Patients notes that her symptoms from the sinus issue are improving and she is feeling better with that. She would like to know if she should stop taking the abx due to the cramps/ gas or if that would be counter productive to clearing the infection? Sending to treating provider and pcp

## 2023-04-23 NOTE — Telephone Encounter (Signed)
 If the symptoms are tolerable then she can continue the Augmentin  antibiotic medication.  It is always important to take the Augmentin  pill with a full stomach to prevent cramping.  If she continues to experience those symptoms then she should stop the antibiotic and let us  know.

## 2023-04-23 NOTE — Telephone Encounter (Signed)
 Copied from CRM [id number]. Topic: Clinical - Medical Advice >> Apr 22, 2023  4:49 PM Molly Wall wrote: Reason for CRM: pt called and stated that she needs a new prescription for a resmed 11 CPAP machine sent to [facility],  tele: [phone],  [address] [name] Molly Wall, [place], [place] [address]. They need chart notes, sleep notes, and proof that she's in compliance. Please advise.

## 2023-04-24 ENCOUNTER — Other Ambulatory Visit: Payer: Self-pay | Admitting: Internal Medicine

## 2023-04-24 NOTE — Telephone Encounter (Signed)
 Called and advised patient of Molly Wall message, she states the cramps have been better last night and today, she started taking on a fuller stomach. She will reach out if it becomes worse or intolerable.

## 2023-04-24 NOTE — Telephone Encounter (Signed)
 Unable to reach patient. Left voicemail to return call to our office.

## 2023-04-24 NOTE — Telephone Encounter (Signed)
 J, Unfortunately, she will need to stay with NovoLog  but inject the insulin  15 minutes before each meal. Ty! C

## 2023-04-27 ENCOUNTER — Telehealth: Payer: Self-pay

## 2023-04-27 ENCOUNTER — Other Ambulatory Visit: Payer: Self-pay | Admitting: Internal Medicine

## 2023-04-27 MED ORDER — NOVOLOG FLEXPEN 100 UNIT/ML ~~LOC~~ SOPN: 25.0000 [IU] | PEN_INJECTOR | Freq: Three times a day (TID) | SUBCUTANEOUS | 11 refills | Status: DC

## 2023-04-27 NOTE — Addendum Note (Signed)
 Addended by: Vernon Goodpasture on: 04/27/2023 08:47 AM   Modules accepted: Orders

## 2023-04-27 NOTE — Telephone Encounter (Signed)
 Left message for patient to confirm appointment on 2/11 with Dr Arno Bibles

## 2023-04-28 ENCOUNTER — Telehealth: Payer: Self-pay | Admitting: Pharmacy Technician

## 2023-04-28 ENCOUNTER — Inpatient Hospital Stay: Payer: Medicare Other | Attending: Hematology and Oncology | Admitting: Hematology and Oncology

## 2023-04-28 ENCOUNTER — Other Ambulatory Visit (HOSPITAL_COMMUNITY): Payer: Self-pay

## 2023-04-28 VITALS — BP 156/59 | HR 89 | Temp 97.2°F | Resp 18 | Wt 233.7 lb

## 2023-04-28 DIAGNOSIS — Z79811 Long term (current) use of aromatase inhibitors: Secondary | ICD-10-CM | POA: Diagnosis not present

## 2023-04-28 DIAGNOSIS — Z1231 Encounter for screening mammogram for malignant neoplasm of breast: Secondary | ICD-10-CM | POA: Diagnosis not present

## 2023-04-28 DIAGNOSIS — N6091 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: Secondary | ICD-10-CM | POA: Insufficient documentation

## 2023-04-28 DIAGNOSIS — Z9189 Other specified personal risk factors, not elsewhere classified: Secondary | ICD-10-CM

## 2023-04-28 NOTE — Progress Notes (Signed)
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 STK11, TP53, TSC1, TSC2, and VHL.  The following genes were evaluated for sequence changes only: SDHA and HOXB13 c.251G>A variant only.   (3) breast cancer high risk: intensified screening:             (a) yearly mammography in May             (b) yearly breast MRI in November             (c) biannual MD breast exam  Per: [email or web address]   (4) breast cancer high risk: risk reduction             (a) anastrozole started 01/05/2018  Per: [email or web address]  CURRENT THERAPY: Anastrozole  INTERVAL HISTORY:  Molly Wall 78 y.o. female returns for follow-up and evaluation of her history of increased breast cancer risk due to atypical ductal hyperplasia and atypical hyperplasia that was diagnosed in October 2019.  Molly Wall is a 78 year old female with breast cancer who presents for follow-up regarding medication management and recent health changes.  She has a  history of breast cancer and has been undergoing regular screenings with mammograms and MRIs every six months. Her last MRI was unremarkable, and she is due for a mammogram in March or April. She typically has her mammograms done at the breast center and MRIs at [facility].  She is currently taking anastrozole, which she has been on for approximately six years. She was under the impression that she was supposed to stop the medication at five and a half years and is eager to discontinue it, stating 'one less pill to take.'  She has experienced recurrent viral infections since October, with the initial illness lasting three weeks and a recent one lasting nine days. She received an infusion for vitamin and immune boost earlier today to help improve her immunity.  She is not currently on Fiasp due to Medicare not approving it, but she is on Humalog and Novolog for her diabetes management.  She does not engage in regular exercise but stays active by moving around her house and doing tasks such as moving boxes.  She has been celibate since her divorce 30 years ago and does not see a gynecologist regularly. She lives independently and manages her household activities.  No significant changes in health since last year. Reports recurrent viral infections since October. No breast masses or lymphadenopathy noted by self-examination.  Rest of the pertinent 10 point ROS reviewed and neg  Patient Active Problem List   Diagnosis Date Noted   Epigastric discomfort 04/20/2023   Productive cough 02/11/2023   Chronic sinusitis 02/11/2023   Acute bronchitis 02/11/2023   Dizziness 11/20/2022  Neuropathic pain of both feet 10/31/2021   GERD (gastroesophageal reflux disease) 04/25/2021   Word finding difficulty 02/21/2020   Arthralgia of both hands 11/29/2019   Seasonal allergic rhinitis 11/29/2019   Chronic knee pain (intermittent) (Right) 11/01/2018   Chronic pain syndrome 10/31/2018   History  iodine allergy 09/28/2018    Class: History of   DDD (degenerative disc disease), lumbar 09/27/2018   Grade 1 Anterolisthesis of L5/S1 09/16/2018   Lumbar facet arthropathy 09/16/2018   Arthropathy of lumbosacral facet joint 09/16/2018   Sialadenitis 08/24/2018   Atypical lobular hyperplasia [facility]) of right breast 11/06/2017   Family history of breast cancer    Family history of colon cancer    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   Constipation 04/13/2017   Chronic low back pain (Secondary area of Pain) (Bilateral) (R>L) 02/13/2016   Chronic lower extremity pain (Primary Area of Pain) (Right) 02/13/2016   Chronic lumbar radicular pain (Right) 02/13/2016   Type 2 diabetes mellitus with hyperglycemia, with long-term current use of insulin (HCC) 07/18/2015   Vitamin D deficiency 07/18/2015   Hyperlipidemia 12/13/2014   Asthma 12/13/2014   Hypothyroidism 12/13/2014   Lumbar herniated disc 12/13/2014    is allergic to iodine-131 and prednisone.  MEDICAL HISTORY: 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   Breast cancer (HCC)    Diabetes mellitus without complication (HCC)    Family history of breast cancer    Family history of colon cancer    Hypercholesteremia    Hypothyroidism    Lumbar herniated disc    OSA (obstructive sleep apnea)    used to use CPAP, not any more over 1 1/2 yrs   Osteoarthritis    Salivary stone    left side   Sleep apnea    no cpap in 2 yrs    Vitamin D deficiency     SURGICAL HISTORY: Past Surgical History:  Procedure Laterality Date   APPENDECTOMY     BREAST LUMPECTOMY Right    per pt - precancerous   BREAST LUMPECTOMY WITH RADIOACTIVE SEED LOCALIZATION Right 08/12/2017   Procedure: RIGHT BREAST LUMPECTOMY X'S 2 WITH RADIOACTIVE SEED LOCALIZATION X'S 2;   Surgeon: Claud Kelp, MD;  Location: [facility];  Service: General;  Laterality: Right;   COLONOSCOPY     11 yrs ago in [place] - polyps but given a 10 yr recall per pt    GANGLION CYST EXCISION     78 years old   INCISION AND DRAINAGE ABSCESS ANAL     KNEE SURGERY  2014   SHOULDER SURGERY  1976   TONSILLECTOMY     age 83    SOCIAL HISTORY: Social History   Socioeconomic History   Marital status: Divorced    Spouse name: Not on file   Number of children: 2   Years of education: Not on file   Highest education level: Not on file  Occupational History   Not on file  Tobacco Use   Smoking status: Never    Passive exposure: Never   Smokeless tobacco: Never  Vaping Use   Vaping status: Never Used  Substance and Sexual Activity   Alcohol use: Not Currently    Comment: rarely   Drug use: No   Sexual activity: Not Currently  Other Topics Concern   Not on file  Social History Narrative   Single.   Moved from [place] to [place] several weeks ago.   Family lives in [place].   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   Social Drivers of Corporate investment banker Strain: Low Risk  (10/23/2022)   Overall Financial Resource Strain (CARDIA)    Difficulty of Paying Living Expenses: Not hard at all  Food Insecurity: No Food Insecurity (10/23/2022)   Hunger Vital Sign    Worried About Running Out of Food in the Last Year: Never true    Ran Out of Food in the Last Year: Never true  Transportation Needs: No Transportation Needs (10/23/2022)   PRAPARE - Administrator, Civil Service (Medical): No    Lack of Transportation (Non-Medical): No  Physical Activity: Inactive (10/23/2022)   Exercise Vital Sign    Days of Exercise per Week: 0 days    Minutes of Exercise per Session: 0 min  Stress: No Stress Concern Present (10/23/2022)   [employer] of Occupational Health -  Occupational Stress Questionnaire    Feeling of Stress : Not at all  Social Connections: Moderately Integrated (10/23/2022)   Social Connection and Isolation Panel [NHANES]    Frequency of Communication with Friends and Family: More than three times a week    Frequency of Social Gatherings with Friends and Family: More than three times a week    Attends Religious Services: More than 4 times per year    Active Member of [employer] or Organizations: Yes    Attends Banker Meetings: 1 to 4 times per year    Marital Status: Divorced  Intimate Partner Violence: Not At Risk (10/23/2022)   Humiliation, Afraid, Rape, and Kick questionnaire    Fear of Current or Ex-Partner: No    Emotionally Abused: No    Physically Abused: No    Sexually Abused: No    FAMILY HISTORY: Family History  Problem Relation Age of Onset   Alzheimer's disease Mother    Asthma Mother    Dementia Mother        cause of death   Arthritis Father    Heart attack Father 8       cause of death   Breast cancer Sister 43   Healthy Sister    Healthy Brother    Liver cancer Maternal Uncle    Colon cancer Paternal Grandmother    Healthy Daughter    Healthy Daughter    Colon polyps Neg Hx    Rectal cancer Neg Hx    Stomach cancer Neg Hx    Esophageal cancer Neg Hx     Review of Systems  Constitutional:  Negative for appetite change, chills, fatigue, fever and unexpected weight change.  HENT:   Negative for hearing loss, lump/mass and trouble swallowing.   Eyes:  Negative for eye problems and icterus.  Respiratory:  Negative for chest tightness, cough and shortness of breath.   Cardiovascular:  Negative for chest pain, leg swelling and palpitations.  Gastrointestinal:  Negative for abdominal distention, abdominal pain, constipation, diarrhea, nausea and vomiting.  Endocrine: Negative for hot flashes.  Genitourinary:  Negative for difficulty urinating.   Musculoskeletal:  Negative for arthralgias.  Skin:   Negative for itching and rash.  Neurological:  Negative for dizziness, extremity weakness, headaches and numbness.  Hematological:  Negative for adenopathy. Does  not bruise/bleed easily.  Psychiatric/Behavioral:  Negative for depression. The patient is not nervous/anxious.       PHYSICAL EXAMINATION  ECOG PERFORMANCE STATUS: 0 - Asymptomatic  Vitals:   04/28/23 1348  BP: (!) 156/59  Pulse: 89  Resp: 18  Temp: (!) 97.2 F (36.2 C)  SpO2: 99%     Physical Exam Constitutional:      General: She is not in acute distress.    Appearance: Normal appearance. She is not toxic-appearing.  HENT:     Head: Normocephalic and atraumatic.  Eyes:     General: No scleral icterus. Cardiovascular:     Rate and Rhythm: Normal rate and regular rhythm.     Pulses: Normal pulses.     Heart sounds: Normal heart sounds.  Pulmonary:     Effort: Pulmonary effort is normal.     Breath sounds: Normal breath sounds.  Chest:     Comments: Right breast s/p lumpectomy, both breasts are benign Abdominal:     General: Abdomen is flat. Bowel sounds are normal. There is no distension.     Palpations: Abdomen is soft.     Tenderness: There is no abdominal tenderness.  Musculoskeletal:        General: No swelling.     Cervical back: Neck supple.  Lymphadenopathy:     Cervical: No cervical adenopathy.  Skin:    General: Skin is warm and dry.     Findings: No rash.  Neurological:     General: No focal deficit present.     Mental Status: She is alert.  Psychiatric:        Mood and Affect: Mood normal.        Behavior: Behavior normal.     LABORATORY DATA:  CBC    Component Value Date/Time   WBC 5.9 03/24/2023 0946   RBC 4.60 03/24/2023 0946   HGB 13.1 03/24/2023 0946   HGB 14.1 10/15/2021 1310   HCT 40.3 03/24/2023 0946   PLT 293.0 03/24/2023 0946   PLT 236 10/15/2021 1310   MCV 87.5 03/24/2023 0946   MCH 28.5 10/15/2021 1310   MCHC 32.4 03/24/2023 0946   RDW 13.8 03/24/2023 0946    LYMPHSABS 1.1 03/24/2023 0946   MONOABS 0.5 03/24/2023 0946   EOSABS 0.3 03/24/2023 0946   BASOSABS 0.0 03/24/2023 0946    CMP     Component Value Date/Time   NA 138 03/24/2023 0946   K 5.1 03/24/2023 0946   CL 99 03/24/2023 0946   CO2 32 03/24/2023 0946   GLUCOSE 211 (H) 03/24/2023 0946   BUN 16 03/24/2023 0946   CREATININE 0.88 03/24/2023 0946   CREATININE 0.78 10/15/2021 1310   CALCIUM 9.2 03/24/2023 0946   PROT 6.9 03/24/2023 0946   ALBUMIN 4.1 03/24/2023 0946   AST 18 03/24/2023 0946   AST 15 10/15/2021 1310   ALT 16 03/24/2023 0946   ALT 11 10/15/2021 1310   ALKPHOS 121 (H) 03/24/2023 0946   BILITOT 0.3 03/24/2023 0946   BILITOT 0.5 10/15/2021 1310   GFRNONAA >60 10/15/2021 1310   GFRAA >60 01/06/2019 1416     ASSESSMENT and THERAPY PLAN:    Molly Wall is a 78 year old woman here today for f/u of her increased breast cancer risk.  She has no signs of cancer during today's visit and continues on Anastrozole with good tolerance.  I recommended she continue with anastrozole, and intensified breast cancer screening with breast MRI and mammogram.  Breast Cancer, Post-Treatment Completed  approximately 6 years of Anastrozole therapy. No current signs of recurrence on physical examination. -Discontinue Anastrozole. -Continue regular breast self-examinations. -Schedule mammogram for early April 2025. -Schedule MRI for late October 2025. -Annual follow-up appointment in February 2026.  Diabetes Patient is on Humalog and Novolog. Molly Wall was discontinued due to insurance issues. -Continue current insulin regimen. -Update medication list to reflect current medications.  Recurrent Infections Patient reports multiple recent illnesses, including a 3-week viral illness and another 9-day illness. Recently received an infusion for vitamin and immune boost. -Continue current supportive care. -Report any new or worsening symptoms promptly.  Bone Health Bone density scan scheduled  for September 01, 2023. -Continue current bone health management. -Attend scheduled bone density scan.  Total encounter time:30 minutes*in face-to-face visit time, chart review, lab review, care coordination, order entry, and documentation of the encounter time.   *Total Encounter Time as defined by the Centers for Medicare and Medicaid Services includes, in addition to the face-to-face time of a patient visit (documented in the note above) non-face-to-face time: obtaining and reviewing outside history, ordering and reviewing medications, tests or procedures, care coordination (communications with other health care professionals or caregivers) and documentation in the medical record.

## 2023-04-28 NOTE — Telephone Encounter (Signed)
Pharmacy Patient Advocate Encounter   Received notification from RX Request Messages that prior authorization for Molly Wall is required/requested.   Insurance verification completed.   The patient is insured through  [employer]  .   Molly Wall has already been denied last month. If she's using this in a pump, it's possible she could get it covered through her Medicare B, instead of D. Otherwise, please see below. Per test claim:  Humalog is preferred by the insurance.  If suggested medication is appropriate, Please send in a new RX and discontinue this one. If not, please advise as to why it's not appropriate so that we may request a Prior Authorization. Please note, some preferred medications may still require a PA.  If the suggested medications have not been trialed and there are no contraindications to their use, the PA will not be submitted, as it will not be approved.

## 2023-04-29 NOTE — Telephone Encounter (Signed)
Per other message in pt chart Dr. Elvera Lennox advised Novolog. And so that was sent in on 04/27/23

## 2023-05-01 ENCOUNTER — Encounter: Payer: Self-pay | Admitting: Internal Medicine

## 2023-05-01 ENCOUNTER — Ambulatory Visit: Payer: Medicare Other | Admitting: Internal Medicine

## 2023-05-01 VITALS — BP 120/72 | HR 87 | Ht 65.0 in | Wt 231.8 lb

## 2023-05-01 DIAGNOSIS — E1165 Type 2 diabetes mellitus with hyperglycemia: Secondary | ICD-10-CM

## 2023-05-01 DIAGNOSIS — E785 Hyperlipidemia, unspecified: Secondary | ICD-10-CM | POA: Diagnosis not present

## 2023-05-01 DIAGNOSIS — Z794 Long term (current) use of insulin: Secondary | ICD-10-CM

## 2023-05-01 MED ORDER — DEXCOM G7 SENSOR MISC: 3.0000 | 4 refills | Status: DC

## 2023-05-01 MED ORDER — GLUCAGON 3 MG/DOSE NA POWD: 3.0000 mg | Freq: Once | NASAL | 11 refills | Status: AC | PRN

## 2023-05-01 NOTE — Patient Instructions (Addendum)
Please continue: - Metformin ER 1000 mg with breakfast or dinner  Restart: - Ozempic 1 mg weekly  Increase: - NovoLog in VGo30: 6-7 clicks before b'fast and lunch; for dinner, take 7-8 clicks If sugars before meals <60, do not take Novolog before that meal. If sugars before meals <90, try to move the bolus right before the meal   Please return in 3-4 months.

## 2023-05-01 NOTE — Progress Notes (Deleted)
Office Visit Note  Patient: Molly Wall             Date of Birth: Nov 14, 1945           MRN: [id number]             PCP: Doreene Nest, NP Referring: Doreene Nest, NP Visit Date: 05/15/2023 Occupation: @GUAROCC @  Subjective:  No chief complaint on file.   History of Present Illness: Molly Wall is a 78 y.o. female ***     Activities of Daily Living:  Patient reports morning stiffness for *** {minute/hour:19697}.   Patient {ACTIONS;DENIES/REPORTS:21021675::"Denies"} nocturnal pain.  Difficulty dressing/grooming: {ACTIONS;DENIES/REPORTS:21021675::"Denies"} Difficulty climbing stairs: {ACTIONS;DENIES/REPORTS:21021675::"Denies"} Difficulty getting out of chair: {ACTIONS;DENIES/REPORTS:21021675::"Denies"} Difficulty using hands for taps, buttons, cutlery, and/or writing: {ACTIONS;DENIES/REPORTS:21021675::"Denies"}  No Rheumatology ROS completed.   PMFS History:  Patient Active Problem List   Diagnosis Date Noted   Epigastric discomfort 04/20/2023   Productive cough 02/11/2023   Chronic sinusitis 02/11/2023   Acute bronchitis 02/11/2023   Dizziness 11/20/2022   Neuropathic pain of both feet 10/31/2021   GERD (gastroesophageal reflux disease) 04/25/2021   Word finding difficulty 02/21/2020   Arthralgia of both hands 11/29/2019   Seasonal allergic rhinitis 11/29/2019   Chronic knee pain (intermittent) (Right) 11/01/2018   Chronic pain syndrome 10/31/2018   History iodine allergy 09/28/2018    Class: History of   DDD (degenerative disc disease), lumbar 09/27/2018   Grade 1 Anterolisthesis of L5/S1 09/16/2018   Lumbar facet arthropathy 09/16/2018   Arthropathy of lumbosacral facet joint 09/16/2018   Sialadenitis 08/24/2018   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 11/06/2017   Family history of breast cancer    Family history of colon cancer    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   Constipation  04/13/2017   Chronic low back pain (Secondary area of Pain) (Bilateral) (R>L) 02/13/2016   Chronic lower extremity pain (Primary Area of Pain) (Right) 02/13/2016   Chronic lumbar radicular pain (Right) 02/13/2016   Type 2 diabetes mellitus with hyperglycemia, with long-term current use of insulin (HCC) 07/18/2015   Vitamin D deficiency 07/18/2015   Hyperlipidemia 12/13/2014   Asthma 12/13/2014   Hypothyroidism 12/13/2014   Lumbar herniated disc 12/13/2014    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   Breast cancer (HCC)    Diabetes mellitus without complication (HCC)    Family history of breast cancer    Family history of colon cancer    Hypercholesteremia    Hypothyroidism    Lumbar herniated disc    OSA (obstructive sleep apnea)    used to use CPAP, not any more over 1 1/2 yrs   Osteoarthritis    Salivary stone    left side   Sleep apnea    no cpap in 2 yrs    Vitamin D deficiency     Family History  Problem Relation Age of Onset   Alzheimer's disease Mother    Asthma Mother    Dementia Mother        cause of death   Arthritis Father    Heart attack Father 53       cause of death   Breast cancer Sister 60   Healthy Sister    Healthy Brother  Liver cancer Maternal Uncle    Colon cancer Paternal Grandmother    Healthy Daughter    Healthy Daughter    Colon polyps Neg Hx    Rectal cancer Neg Hx    Stomach cancer Neg Hx    Esophageal cancer Neg Hx    Past Surgical History:  Procedure Laterality Date   APPENDECTOMY     BREAST LUMPECTOMY Right    per pt - precancerous   BREAST LUMPECTOMY WITH RADIOACTIVE SEED LOCALIZATION Right 08/12/2017   Procedure: RIGHT BREAST LUMPECTOMY X'S 2 WITH RADIOACTIVE SEED LOCALIZATION X'S 2;  Surgeon: Claud Kelp, MD;  Location: [facility];   Service: General;  Laterality: Right;   COLONOSCOPY     11 yrs ago in [place] - polyps but given a 10 yr recall per pt    GANGLION CYST EXCISION     78 years old   INCISION AND DRAINAGE ABSCESS ANAL     KNEE SURGERY  2014   SHOULDER SURGERY  1976   TONSILLECTOMY     age 82   Social History   Social History Narrative   Single.   Moved from [place] to [place] several weeks ago.   Family lives in [place].   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   Immunization History  Administered Date(s) Administered   Fluad Quad(high Dose 65+) 12/01/2018, 11/29/2019   Influenza,inj,Quad PF,6+ Mos 01/19/2015, 12/28/2015, 12/25/2016, 12/09/2017   Pneumococcal Conjugate-13 12/01/2018   Pneumococcal Polysaccharide-23 03/17/2014   Zoster, Live 07/18/2015     Objective: Vital Signs: There were no vitals taken for this visit.   Physical Exam   Musculoskeletal Exam: ***  CDAI Exam: CDAI Score: -- Patient Global: --; Provider Global: -- Swollen: --; Tender: -- Joint Exam 05/15/2023   No joint exam has been documented for this visit   There is currently no information documented on the homunculus. Go to the Rheumatology activity and complete the homunculus joint exam.  Investigation: No additional findings.  Imaging: CT BRAINLAB SINUS W/O CONTRAST ( ) Result Date: 04/19/2023 CLINICAL DATA:  Provided history: Chronic pansinusitis. EXAM: CT STEALTH SINUS W/O CONTRAST TECHNIQUE: Multidetector CT images of the paranasal sinuses were obtained using the standard protocol without intravenous contrast. RADIATION DOSE REDUCTION: This exam was performed according to the departmental dose-optimization program which includes automated exposure control, adjustment of the mA and/or kV according to patient size and/or use of iterative reconstruction technique. COMPARISON:  Head CT 12/02/2022. FINDINGS: Paranasal sinuses:  Frontal: Small-volume secretions within the right frontal sinus. Mucosal thickening within the right frontal sinus inferiorly. The frontal sinus drainage pathways are opacified, bilaterally. Ethmoid: Mild-to-moderate ethmoid sinusitis bilaterally. Maxillary: Moderate polypoid mucosal thickening within the right maxillary sinus. Moderate-volume secretions, and background moderate polypoid mucosal thickening, within the left maxillary sinus. Sphenoid: The sphenoid sinus ostia are opacified by mucosal thickening and/or secretions bilaterally. Otherwise normally aerated. Mucosal thickening narrows the sphenoethmoidal recesses bilaterally. Right ostiomeatal unit: The ethmoid infundibulum is narrowed by mucosal thickening. Otherwise patent. A small accessory maxillary sinus ostium is patent (series 4, image 53). Left ostiomeatal unit: The ethmoid infundibulum and hiatus semilunaris are partially obstructed by mucosal thickening and secretions. Nasal passages: Slight rightward deviation of the bony nasal septum. Rightward bony spurring of the mid nasal septum. Mild-to-moderate mucosal thickening, and small-volume secretions, within the bilateral nasal passages. Anatomy: Pneumatization is present superior to the anterior ethmoid notches bilaterally. Sellar sphenoid pneumatization pattern.  IMPRESSION: 1. Pansinusitis with multifocal sinus drainage pathway obstruction, as outlined within the body of the report. 2. Slight rightward deviation of the bony nasal septum (with rightward bony spurring). 3. Mild-to-moderate mucosal thickening, and small-volume secretions, within the bilateral nasal passages. Electronically Signed   By: Jackey Loge D.O.   On: 04/19/2023 14:14    Recent Labs: Lab Results  Component Value Date   WBC 5.9 03/24/2023   HGB 13.1 03/24/2023   PLT 293.0 03/24/2023   NA 138 03/24/2023   K 5.1 03/24/2023   CL 99 03/24/2023   CO2 32 03/24/2023   GLUCOSE 211 (H) 03/24/2023   BUN 16 03/24/2023    CREATININE 0.88 03/24/2023   BILITOT 0.3 03/24/2023   ALKPHOS 121 (H) 03/24/2023   AST 18 03/24/2023   ALT 16 03/24/2023   PROT 6.9 03/24/2023   ALBUMIN 4.1 03/24/2023   CALCIUM 9.2 03/24/2023   GFRAA >60 01/06/2019    Speciality Comments: No specialty comments available.  Procedures:  No procedures performed Allergies: Iodine-131 and Prednisone   Assessment / Plan:     Visit Diagnoses: No diagnosis found.  Orders: No orders of the defined types were placed in this encounter.  No orders of the defined types were placed in this encounter.   Face-to-face time spent with patient was *** minutes. Greater than 50% of time was spent in counseling and coordination of care.  Follow-Up Instructions: No follow-ups on file.   Ellen Henri, CMA  Note - This record has been created using Animal nutritionist.  Chart creation errors have been sought, but may not always  have been located. Such creation errors do not reflect on  the standard of medical care.

## 2023-05-01 NOTE — Progress Notes (Signed)
Patient ID: Molly Wall, female   DOB: 1945-05-30, 78 y.o.   MRN: [id number]   HPI: Molly Wall is a 78 y.o.-year-old female, initially referred by her PCP, Molly Rieger, MD, returning for follow-up for DM2, dx at 66 (2017), h/o reactive hypoglycemia, insulin-dependent, uncontrolled, without long-term complications.  Last visit 1.5 months ago.  She is here with her daughter.  Interim history: She denies increased urination, blurry vision, nausea, chest pain.  She had several URIs with fatigue, nausea, diarrhea.  She feels that her sugars have been better as she was now eating too well.  Reviewed HbA1c levels: Lab Results  Component Value Date   HGBA1C 7.3 (A) 03/24/2023   HGBA1C 7.5 (H) 03/24/2023   HGBA1C 7.4 (A) 11/18/2022   HGBA1C 7.7 (A) 04/15/2022   HGBA1C 8.4 (A) 12/09/2021   HGBA1C 7.4 (A) 08/08/2021   HGBA1C 7.2 (A) 04/04/2021   HGBA1C 7.1 (A) 01/01/2021   HGBA1C 6.8 (A) 09/20/2020   HGBA1C 8.1 (A) 06/21/2020   She is on: - Metformin ER 1000 mg with breakfast - Ozempic 0.5 >> 1 mg weekly  -off for the last 2 weeks 2/2 nausea - VGo 30 >> ... >>- VGo20 with 5-7 clicks before meals +/- Novolog 2-4 units for correction (when on steroids) >> Humalog 6-7 clicks before b'fast and lunch; for dinner, take 7-8 clicks >> VGo30 5-7 ckicks If sugars before meals <60, do not take Novolog before that meal. If sugars before meals <90, try to move the bolus right before the meal  Previously on: - Lantus 35 >> 25 units in am >> 25 units in a.m. and 10 units at bedtime >> 20 units in a.m. and 10 units at night - Novolog 7-9 units before 15 min before meals >> B'fast: 6-8 units  Lunch: 10-11 units Dinner: 6-7 units  She checks her sugars more than 4 times a day with her CGM:  Prev.:  Previously:   Lowest sugar was 40s >> 39, 50s >> 50s; she has hypoglycemia awareness in the 80s. Highest sugar was >600 >> HI >> 600 >> 300s.   Glucometer: One Touch  Verio  -No CKD, last BUN/creatinine:  Lab Results  Component Value Date   BUN 16 03/24/2023   BUN 17 11/20/2022   CREATININE 0.88 03/24/2023   CREATININE 0.90 11/20/2022   Lab Results  Component Value Date   MICRALBCREAT 0.5 11/20/2022   MICRALBCREAT 0.8 10/31/2021   MICRALBCREAT 1.1 09/20/2020   MICRALBCREAT 1.0 04/27/2018   MICRALBCREAT 1.9 04/10/2016  Not on ACE inhibitor/ARB.  -No HL; last set of lipids: Lab Results  Component Value Date   CHOL 213 (H) 03/24/2023   HDL 93.40 03/24/2023   LDLCALC 107 (H) 03/24/2023   TRIG 65.0 03/24/2023   CHOLHDL 2 03/24/2023  On Lipitor 20.  - last eye exam was 07/14/2022: No DR reportedly, + cataracts. Coming up.  -No numbness but has tingling in her feet. She has pain in feet. Last foot exam: 11/18/2022.  Pt has FH of DM in GF.  She has OSA and started a CPAP after which she felt much better. 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   She is on levothyroxine 88 mcg 1 tablet 6/7 days and 1.5 tablets 1/7 days.   Latest TSH was  normal: Lab Results  Component Value Date   TSH 4.82 11/20/2022   She has a h/o constipation.  Tried metamucil, dulcolax, enemas, then using a supplement by Dr. Ethelene Browns >> helping.  She lives alone.  ROS: + See HPI  I reviewed pt's medications, allergies, PMH, social hx, family hx, and changes were documented in the history of present illness. Otherwise, unchanged from my initial visit note.  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   Breast cancer (HCC)    Diabetes mellitus without complication (HCC)    Family history of breast cancer    Family history of  colon cancer    Hypercholesteremia    Hypothyroidism    Lumbar herniated disc    OSA (obstructive sleep apnea)    used to use CPAP, not any more over 1 1/2 yrs   Osteoarthritis    Salivary stone    left side   Sleep apnea    no cpap in 2 yrs    Vitamin D deficiency    Past Surgical History:  Procedure Laterality Date   APPENDECTOMY     BREAST LUMPECTOMY Right    per pt - precancerous   BREAST LUMPECTOMY WITH RADIOACTIVE SEED LOCALIZATION Right 08/12/2017   Procedure: RIGHT BREAST LUMPECTOMY X'S 2 WITH RADIOACTIVE SEED LOCALIZATION X'S 2;  Surgeon: Claud Kelp, MD;  Location: [facility];  Service: General;  Laterality: Right;   COLONOSCOPY     11 yrs ago in [place] - polyps but given a 10 yr recall per pt    GANGLION CYST EXCISION     78 years old   INCISION AND DRAINAGE ABSCESS ANAL     KNEE SURGERY  2014   SHOULDER SURGERY  1976   TONSILLECTOMY     age 78   Social History   Socioeconomic History   Marital status: Divorced    Spouse name: Not on file   Number of children: 2   Years of education: Not on file   Highest education level: Not on file  Occupational History   Not on file  Tobacco Use   Smoking status: Former Smoker    Packs/day: 1.00    Years: 2.00    Pack years: 2.00    Types: Cigarettes   Smokeless tobacco: Never Used   Tobacco comment: quite smoker at age 78  Substance and Sexual Activity   Alcohol use: Yes    Alcohol/week: 0.0 - 1.0 standard drinks    Comment: 0-1 per month   Drug use: No   Sexual activity: Not Currently  Other Topics Concern   Not on file  Social History Narrative   Single.   Moved from [place] to [place].   Family lives in [place].   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   Social Determinants of Health   Financial Resource Strain:    Difficulty of Paying Living Expenses: Not on file  Food Insecurity:    Worried  About Programme researcher, broadcasting/film/video in the Last Year: Not on file   [employer] of Food in the Last Year: Not on file  Transportation Needs:    Lack of Transportation (Medical): Not on file   Lack of Transportation (  Non-Medical): Not on file  Physical Activity:    Days of Exercise per Week: Not on file   Minutes of Exercise per Session: Not on file  Stress:    Feeling of Stress : Not on file  Social Connections:    Frequency of Communication with Friends and Family: Not on file   Frequency of Social Gatherings with Friends and Family: Not on file   Attends Religious Services: Not on file   Active Member of Clubs or Organizations: Not on file   Attends Banker Meetings: Not on file   Marital Status: Not on file  Intimate Partner Violence:    Fear of Current or Ex-Partner: Not on file   Emotionally Abused: Not on file   Physically Abused: Not on file   Sexually Abused: Not on file   Current Outpatient Medications on File Prior to Visit  Medication Sig Dispense Refill   albuterol (VENTOLIN HFA) 108 (90 Base) MCG/ACT inhaler TAKE 2 PUFFS BY MOUTH EVERY 6 HOURS AS NEEDED FOR WHEEZE/SHORTNESS OF BREATH 8.5 each 1   atorvastatin (LIPITOR) 20 MG tablet TAKE 1 TABLET BY MOUTH EVERY DAY FOR CHOLESTEROL 90 tablet 2   BD PEN NEEDLE NANO 2ND GEN 32G X 4 MM MISC USE AS DIRECTED 3 TIMES A DAY 100 each 3   Continuous Blood Gluc Receiver (DEXCOM G6 RECEIVER) DEVI Use to monitor blood sugar 1 each 0   Continuous Glucose Sensor (DEXCOM G6 SENSOR) MISC USE AS INSTRUCTED CHANGE SENSOR EVERY 10 DAYS E11.65 9 each 3   Continuous Glucose Transmitter (DEXCOM G6 TRANSMITTER) MISC REPLACE EVERY 3 (THREE) MONTHS. 1 each 3   fluticasone (FLONASE) 50 MCG/ACT nasal spray INSTILL 1 SPRAY IN EACH NOSTRIL TWICE A DAY AS NEEDED FOR ALLERGIES OR RHINITIS 48 mL 2   gabapentin (NEURONTIN) 100 MG capsule Take 1-3 capsules by mouth once to twice daily for pain. 90 capsule 0   glucose blood (ONETOUCH VERIO) test strip Test  blood sugar up to three times daily for diabetes. 300 strip 1   insulin aspart (NOVOLOG FLEXPEN) 100 UNIT/ML FlexPen Inject 25 Units into the skin 3 (three) times daily before meals. 15 mL 11   Insulin Disposable Pump (V-GO 30) 30 UNIT/24HR KIT 1 each by Does not apply route daily. 30 kit 5   insulin lispro (HUMALOG KWIKPEN) 100 UNIT/ML KwikPen INJECT 25 UNITS INTO THE SKIN 3 (THREE) TIMES DAILY WITH MEALS 15 mL PRN   levothyroxine (SYNTHROID) 88 MCG tablet TAKE 1 AND 1/2 TABLET BY MOUTH EVERY SUNDAY. TAKE 1 TABLET BY MOUTH MON THROUGH SAT. TAKE ON AN EMPTY STOMACH WITH WATER ONLY. NO FOOD OR OTHER MEDICATIONS FOR 30 MINUTES. 96 tablet 2   metFORMIN (GLUCOPHAGE-XR) 500 MG 24 hr tablet TAKE 2 TABLETS (1,000 MG TOTAL) BY MOUTH DAILY WITH BREAKFAST. FOR DIABETES. 180 tablet 1   montelukast (SINGULAIR) 10 MG tablet TAKE 1 TAB BY MOUTH AT BEDTIME FOR ALLERGIES AND ASTHMA. 90 tablet 2   PREBIOTIC PRODUCT PO Take 1 capsule by mouth daily.     Semaglutide, 1 MG/DOSE, (OZEMPIC, 1 MG/DOSE,) 4 MG/3ML SOPN INJECT 1MG  INTO THE SKIN ONCE A WEEK 9 mL 4   WIXELA INHUB 250-50 MCG/ACT AEPB INHALE 1 PUFF BY MOUTH TWICE A DAY (Patient taking differently: Inhale 1 puff into the lungs daily.) 60 each 9   No current facility-administered medications on file prior to visit.   Allergies  Allergen Reactions   Iodine-131 Anaphylaxis    Pt states when she was  21=she drank an experimental Iodinated Radioisotope for her thyroid. She described it was a small amount in a milk carton in [place]. She states they stopped using it shortly after because the trial wasn't successful. SPM   Prednisone     Make sugar jump way up.   Family History  Problem Relation Age of Onset   Alzheimer's disease Mother    Asthma Mother    Dementia Mother        cause of death   Arthritis Father    Heart attack Father 63       cause of death   Breast cancer Sister 34   Healthy Sister    Healthy Brother    Liver cancer Maternal Uncle     Colon cancer Paternal Grandmother    Healthy Daughter    Healthy Daughter    Colon polyps Neg Hx    Rectal cancer Neg Hx    Stomach cancer Neg Hx    Esophageal cancer Neg Hx    PE: BP 120/72   Pulse 87   Ht 5\' 5"  (1.651 m)   Wt 231 lb 12.8 oz (105.1 kg)   SpO2 99%   BMI 38.57 kg/m   Wt Readings from Last 10 Encounters:  05/01/23 231 lb 12.8 oz (105.1 kg)  04/28/23 233 lb 11.2 oz (106 kg)  04/20/23 232 lb 6 oz (105.4 kg)  03/24/23 234 lb 6.4 oz (106.3 kg)  03/24/23 234 lb 8 oz (106.4 kg)  02/11/23 222 lb 2 oz (100.8 kg)  01/28/23 214 lb (97.1 kg)  01/13/23 214 lb (97.1 kg)  12/23/22 214 lb (97.1 kg)  12/16/22 218 lb (98.9 kg)   Constitutional: overweight, in NAD Eyes:  EOMI, no exophthalmos ENT: no neck masses, no cervical lymphadenopathy Cardiovascular: RRR, No MRG Respiratory: CTA B Musculoskeletal: no deformities Skin:no rashes Neurological: no tremor with outstretched hands  ASSESSMENT: 1. DM2, insulin-dependent, uncontrolled, without long-term complications, but with hyperglycemia  2. HL  3. Obesity class 1  PLAN:  1. Patient with  longstanding, insulin dependent DM2, on Metformin, weekly GLP1 R agonist and VGo patch pump, with improving control - HbA1c at last OV decreased to 7.3%. She was forgetting to take metformin in the morning and we discussed that she can take it either in the morning or with dinner. She was forgetting to take Novolog 15 min before meals - rec'd alarms. However, I also rec'd to try to switch to FiAsp so she could inject at the start of the meals. However, PA for this was denied. She was adding Humalog boluses to Novolog pump boluses when she needed more insulin, as she had Humalog at home. We continued this. CGM interpretation: -At today's visit, we reviewed her CGM downloads: It appears that 55% of values are in target range (goal >70%), while 43% are higher than 180 (goal <25%), and 2% are lower than 70 (goal <4%).  The calculated average  blood sugar is 173.  The projected HbA1c for the next 3 months (GMI) is 7.5%. -Reviewing the CGM trends, sugars appear to be improving overnight, occasionally dropping too low around 9 AM, but then increasing drastically after she starts eating with high postprandial blood sugars, up to the 200s and even 300s.  Upon questioning, patient is not taking the NovoLog 15 minutes before meals and we discussed with patient and her daughter about the importance of doing so.  We also discussed about continuing to add extra insulin through the pain (she has Humalog) if  she needs more insulin for meals.  I recommended against insulin to carb counting or a sliding scale (per daughter's questions) to not complicate her regimen.  Also, daughter is concerned about patient having hypoglycemia and being unable to take fast carbs.  I called in a prescription for intranasal glucagon for her today and advised him how to use it. -She has been off Ozempic due to nausea, but this is better so she is planning to restart this.  This will also help with the blood sugar loss.  Also, she is using lower doses of NovoLog through the V-Go and we discussed about increasing them. - I suggested to:  Patient Instructions  Please continue: - Metformin ER 1000 mg with breakfast or dinner  Restart: - Ozempic 1 mg weekly  Increase: - NovoLog in VGo30: 6-7 clicks before b'fast and lunch; for dinner, take 7-8 clicks If sugars before meals <60, do not take Novolog before that meal. If sugars before meals <90, try to move the bolus right before the meal   Please return in 3-4 months.   - advised to check sugars at different times of the day - 4x a day, rotating check times - advised for yearly eye exams >> she is UTD - return to clinic in 3-4 months  2. HL -latest lipid panel was reviewed from 03/2023: LDL above target, OTW fractions at goal: Lab Results  Component Value Date   CHOL 213 (H) 03/24/2023   HDL 93.40 03/24/2023    LDLCALC 107 (H) 03/24/2023   TRIG 65.0 03/24/2023   CHOLHDL 2 03/24/2023  -She continues Lipitor 20 mg daily w/o SEs  3.  Obesity class II -continue the GLP-1 receptor agonist which should also help with weight loss (was off for the last 2 weeks due to nausea) -She was previously on a plant-based diet but she felt that this did not help with her diabetes control -She gained 11 + 17 pounds before the last 2 visits combined -Since last visit, she lost 3 pounds  Carlus Pavlov, MD PhD [facility]

## 2023-05-02 ENCOUNTER — Other Ambulatory Visit: Payer: Self-pay | Admitting: Internal Medicine

## 2023-05-05 ENCOUNTER — Telehealth: Payer: Self-pay | Admitting: Primary Care

## 2023-05-05 DIAGNOSIS — E1165 Type 2 diabetes mellitus with hyperglycemia: Secondary | ICD-10-CM

## 2023-05-05 NOTE — Telephone Encounter (Signed)
Copied from CRM [id number]. Topic: Referral - Request for Referral >> May 05, 2023  2:03 PM [name] wrote: Did the patient discuss referral with their provider in the last year? No (If No - schedule appointment) (If Yes - send message)  Appointment offered? No  Type of order/referral and detailed reason for visit: Needs referrals to change providers for endocrinologist due to needs more attention and clarity  Dr. Verdis Frederickson Endocrinologist   Preference of office, provider, location: Dr. Verdis Frederickson Endocrinologist   If referral order, have you been seen by this specialty before? Yes (If Yes, this issue or another issue? When? Where? Dr Derald Macleod  Can we respond through MyChart? Yes

## 2023-05-06 NOTE — Telephone Encounter (Signed)
 Unable to reach patient. Left voicemail to return call to our office.

## 2023-05-06 NOTE — Telephone Encounter (Signed)
Please call patient:  Let her know that I placed the referral as she requested.  She can try contacting Dr. Elberta Fortis office in 1 week to schedule.

## 2023-05-06 NOTE — Addendum Note (Signed)
Addended by: Doreene Nest on: 05/06/2023 12:00 PM   Modules accepted: Orders

## 2023-05-08 NOTE — Telephone Encounter (Signed)
 Called patient and reviewed all information. Patient verbalized understanding. Will call if any further questions.

## 2023-05-12 NOTE — Telephone Encounter (Signed)
 Reason for CRM: Patient was wondering if referral from 05/06/2023 to Endocrinology could be faxed over to Endocrinology instead of electronically sent.

## 2023-05-13 ENCOUNTER — Telehealth: Payer: Self-pay | Admitting: *Deleted

## 2023-05-13 NOTE — Telephone Encounter (Signed)
 LMOM for patient to call office to discuss injection

## 2023-05-13 NOTE — Telephone Encounter (Signed)
 Patient may cancel the appointment for thumb injection if she is concerned.

## 2023-05-13 NOTE — Telephone Encounter (Signed)
 Patient advised she may cancel the appointment for thumb injection if she is concerned.

## 2023-05-13 NOTE — Telephone Encounter (Signed)
 Patient contacted the office and left a message stating she is concerned about the injection in her thumb she is scheduled for on Friday. Patient states she does want to proceed with the Visco in her knees but is unsure if she should proceed with the injection in her thumb. Patient states she is diabetic and is highly reactive to any type of steroid. Patient states any dose of prednisone or any cortisone she has causes her blood sugar to be over 600 and it stays that way for hours.

## 2023-05-15 ENCOUNTER — Encounter: Payer: Self-pay | Admitting: Rheumatology

## 2023-05-15 ENCOUNTER — Telehealth: Payer: Self-pay

## 2023-05-15 ENCOUNTER — Ambulatory Visit: Payer: Medicare Other | Attending: Rheumatology | Admitting: Rheumatology

## 2023-05-15 ENCOUNTER — Other Ambulatory Visit: Payer: Self-pay | Admitting: Internal Medicine

## 2023-05-15 VITALS — BP 126/70 | HR 75 | Resp 16 | Ht 65.0 in | Wt 235.6 lb

## 2023-05-15 DIAGNOSIS — M65312 Trigger thumb, left thumb: Secondary | ICD-10-CM | POA: Diagnosis present

## 2023-05-15 DIAGNOSIS — E559 Vitamin D deficiency, unspecified: Secondary | ICD-10-CM | POA: Diagnosis present

## 2023-05-15 DIAGNOSIS — N6091 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: Secondary | ICD-10-CM | POA: Diagnosis present

## 2023-05-15 DIAGNOSIS — Q667 Congenital pes cavus, unspecified foot: Secondary | ICD-10-CM | POA: Insufficient documentation

## 2023-05-15 DIAGNOSIS — R768 Other specified abnormal immunological findings in serum: Secondary | ICD-10-CM | POA: Insufficient documentation

## 2023-05-15 DIAGNOSIS — Z803 Family history of malignant neoplasm of breast: Secondary | ICD-10-CM | POA: Diagnosis present

## 2023-05-15 DIAGNOSIS — Z800 Family history of malignant neoplasm of digestive organs: Secondary | ICD-10-CM | POA: Diagnosis present

## 2023-05-15 DIAGNOSIS — E1165 Type 2 diabetes mellitus with hyperglycemia: Secondary | ICD-10-CM | POA: Diagnosis present

## 2023-05-15 DIAGNOSIS — M19041 Primary osteoarthritis, right hand: Secondary | ICD-10-CM | POA: Insufficient documentation

## 2023-05-15 DIAGNOSIS — Z8639 Personal history of other endocrine, nutritional and metabolic disease: Secondary | ICD-10-CM | POA: Insufficient documentation

## 2023-05-15 DIAGNOSIS — M65331 Trigger finger, right middle finger: Secondary | ICD-10-CM | POA: Insufficient documentation

## 2023-05-15 DIAGNOSIS — M170 Bilateral primary osteoarthritis of knee: Secondary | ICD-10-CM | POA: Diagnosis present

## 2023-05-15 DIAGNOSIS — M4310 Spondylolisthesis, site unspecified: Secondary | ICD-10-CM | POA: Insufficient documentation

## 2023-05-15 DIAGNOSIS — M65311 Trigger thumb, right thumb: Secondary | ICD-10-CM | POA: Insufficient documentation

## 2023-05-15 DIAGNOSIS — J4521 Mild intermittent asthma with (acute) exacerbation: Secondary | ICD-10-CM | POA: Diagnosis present

## 2023-05-15 DIAGNOSIS — Z794 Long term (current) use of insulin: Secondary | ICD-10-CM | POA: Insufficient documentation

## 2023-05-15 DIAGNOSIS — M47816 Spondylosis without myelopathy or radiculopathy, lumbar region: Secondary | ICD-10-CM | POA: Diagnosis present

## 2023-05-15 DIAGNOSIS — M19042 Primary osteoarthritis, left hand: Secondary | ICD-10-CM | POA: Insufficient documentation

## 2023-05-15 DIAGNOSIS — G894 Chronic pain syndrome: Secondary | ICD-10-CM | POA: Insufficient documentation

## 2023-05-15 DIAGNOSIS — M25561 Pain in right knee: Secondary | ICD-10-CM

## 2023-05-15 MED ORDER — HYALURONAN 30 MG/2ML IX SOSY
30.0000 mg | PREFILLED_SYRINGE | INTRA_ARTICULAR | Status: AC | PRN
  Administered 2023-05-15: 30 mg via INTRA_ARTICULAR

## 2023-05-15 MED ORDER — LIDOCAINE HCL 1 % IJ SOLN
1.5000 mL | INTRAMUSCULAR | Status: AC | PRN
  Administered 2023-05-15: 1.5 mL

## 2023-05-15 NOTE — Telephone Encounter (Signed)
 Please advise patient to rest today and tomorrow to avoid rebound pain after visco. She can ice and elevate through the weekend if needed
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
## 2023-05-15 NOTE — Telephone Encounter (Signed)
 Patient contacted the office and states she had received gel knee injections this morning. Patient inquires if she needs to hold off on any physical activities for any amount of time. Patient states she like to ride her bike because it help with her diabetes to keep her blood sugar in control. Please advise.

## 2023-05-15 NOTE — Telephone Encounter (Signed)
 Patient advised to rest today and tomorrow to avoid rebound pain after visco. She can ice and elevate through the weekend if needed. Patient verbalized understanding.

## 2023-05-18 ENCOUNTER — Encounter: Payer: Self-pay | Admitting: Internal Medicine

## 2023-05-18 ENCOUNTER — Ambulatory Visit: Payer: Medicare Other | Admitting: Internal Medicine

## 2023-05-18 VITALS — BP 136/88 | HR 80 | Temp 97.6°F | Resp 95 | Ht 65.0 in

## 2023-05-18 DIAGNOSIS — J4520 Mild intermittent asthma, uncomplicated: Secondary | ICD-10-CM

## 2023-05-18 DIAGNOSIS — G4733 Obstructive sleep apnea (adult) (pediatric): Secondary | ICD-10-CM

## 2023-05-18 NOTE — Progress Notes (Unsigned)
 Name: Molly Wall MRN: [id number] DOB: Jan 05, 1946    CHIEF COMPLAINT:  EXCESSIVE DAYTIME SLEEPINESS Assessment of asthma   HISTORY OF PRESENT ILLNESS: Patient is seen today for problems and issues with sleep related to excessive daytime sleepiness Patient  has been having sleep problems for many years Patient has been having excessive daytime sleepiness for a long time Patient has been having extreme fatigue and tiredness, lack of energy +  very Loud snoring every night + struggling breathe at night and gasps for air + morning headaches + Nonrefreshing sleep  In 2020 patient was diagnosed with sleep apnea with AHI of 13 Patient has been on auto CPAP ever since  Discussed sleep data and reviewed with patient.  Encouraged proper weight management.  Discussed driving precautions and its relationship with hypersomnolence.  Discussed operating dangerous equipment and its relationship with hypersomnolence.  Discussed sleep hygiene, and benefits of a fixed sleep waked time.  The importance of getting eight or more hours of sleep discussed with patient.  Discussed limiting the use of the computer and television before bedtime.  Decrease naps during the day, so night time sleep will become enhanced.  Limit caffeine, and sleep deprivation.  HTN, stroke, and heart failure are potential risk factors.   CPAP compliance report reviewed in detail January to March 2025 87%compliance for days 100%compliance for more than 4 hours Auto CPAP 5-15 AHI reduced to 6.7   PAST MEDICAL HISTORY :   has a past medical history of Adventitious breath sounds (11/20/2021), Allergic rhinitis, Allergy, Arthritis, Asthma, Atypical ductal hyperplasia of right breast (08/12/2017), Breast cancer (HCC), Diabetes mellitus without complication (HCC), Family history of breast cancer, Family history of colon cancer, Hypercholesteremia, Hypothyroidism, Lumbar herniated disc, OSA (obstructive sleep  apnea), Osteoarthritis, Salivary stone, Sleep apnea, and Vitamin D deficiency.  has a past surgical history that includes Shoulder surgery (1976); Knee surgery (2014); Appendectomy; Ganglion cyst excision; Incision and drainage abscess anal; Colonoscopy; Breast lumpectomy with radioactive seed localization (Right, 08/12/2017); Tonsillectomy; and Breast lumpectomy (Right). Prior to Admission medications   Medication Sig Start Date End Date Taking? Authorizing Provider  albuterol (VENTOLIN HFA) 108 (90 Base) MCG/ACT inhaler TAKE 2 PUFFS BY MOUTH EVERY 6 HOURS AS NEEDED FOR WHEEZE/SHORTNESS OF BREATH 04/23/23   Eustaquio Boyden, MD  atorvastatin (LIPITOR) 20 MG tablet TAKE 1 TABLET BY MOUTH EVERY DAY FOR CHOLESTEROL 12/17/22   Doreene Nest, NP  BD PEN NEEDLE NANO 2ND GEN 32G X 4 MM MISC USE AS DIRECTED 3 TIMES A DAY 08/19/22   Carlus Pavlov, MD  Continuous Blood Gluc Receiver (DEXCOM G6 RECEIVER) DEVI Use to monitor blood sugar 12/11/20   Doreene Nest, NP  Continuous Glucose Sensor (DEXCOM G7 SENSOR) MISC Use to check glucose continuously,change every 10 days Dx Code: E11.65 05/15/23   Carlus Pavlov, MD  Continuous Glucose Transmitter (DEXCOM G6 TRANSMITTER) MISC REPLACE EVERY 3 (THREE) MONTHS. 01/29/23   Carlus Pavlov, MD  fluticasone (FLONASE) 50 MCG/ACT nasal spray INSTILL 1 SPRAY IN EACH NOSTRIL TWICE A DAY AS NEEDED FOR ALLERGIES OR RHINITIS 01/07/23   Doreene Nest, NP  gabapentin (NEURONTIN) 100 MG capsule Take 1-3 capsules by mouth once to twice daily for pain. Patient not taking: Reported on 05/15/2023 10/31/21   Doreene Nest, NP  Glucagon 3 MG/DOSE POWD Place 3 mg into the nose once as needed for up to 1 dose. 05/01/23   Carlus Pavlov, MD  glucose blood (ONETOUCH VERIO) test strip Test blood sugar up  to three times daily for diabetes. 02/25/23   Doreene Nest, NP  insulin aspart (NOVOLOG FLEXPEN) 100 UNIT/ML FlexPen Inject 25 Units into the skin 3 (three)  times daily before meals. 04/27/23   Carlus Pavlov, MD  Insulin Disposable Pump (V-GO 30) 30 UNIT/24HR KIT USE AS DIRECTED 05/04/23   Carlus Pavlov, MD  insulin lispro (HUMALOG KWIKPEN) 100 UNIT/ML KwikPen INJECT 25 UNITS INTO THE SKIN 3 (THREE) TIMES DAILY WITH MEALS 10/21/22   Carlus Pavlov, MD  levothyroxine (SYNTHROID) 88 MCG tablet TAKE 1 AND 1/2 TABLET BY MOUTH EVERY SUNDAY. TAKE 1 TABLET BY MOUTH MON THROUGH SAT. TAKE ON AN EMPTY STOMACH WITH WATER ONLY. NO FOOD OR OTHER MEDICATIONS FOR 30 MINUTES. 12/17/22   Doreene Nest, NP  metFORMIN (GLUCOPHAGE-XR) 500 MG 24 hr tablet TAKE 2 TABLETS (1,000 MG TOTAL) BY MOUTH DAILY WITH BREAKFAST. FOR DIABETES. 12/22/22   Carlus Pavlov, MD  montelukast (SINGULAIR) 10 MG tablet TAKE 1 TAB BY MOUTH AT BEDTIME FOR ALLERGIES AND ASTHMA. 12/17/22   Doreene Nest, NP  PREBIOTIC PRODUCT PO Take 1 capsule by mouth daily.    [provider]  Semaglutide, 1 MG/DOSE, (OZEMPIC, 1 MG/DOSE,) 4 MG/3ML SOPN INJECT 1MG  INTO THE SKIN ONCE A WEEK 07/22/22   Carlus Pavlov, MD  Monte Fantasia INHUB 250-50 MCG/ACT AEPB INHALE 1 PUFF BY MOUTH TWICE A DAY Patient taking differently: Inhale 1 puff into the lungs daily. 12/05/22   Doreene Nest, NP   Allergies  Allergen Reactions   Iodine-131 Anaphylaxis    Pt states when she was 21=she drank an experimental Iodinated Radioisotope for her thyroid. She described it was a small amount in a milk carton in [place]. She states they stopped using it shortly after because the trial wasn't successful. SPM   Prednisone     Make sugar jump way up.    FAMILY HISTORY:  family history includes Alzheimer's disease in her mother; Arthritis in her father; Asthma in her mother; Breast cancer (age of onset: 58) in her sister; Colon cancer in her paternal grandmother; Dementia in her mother; Healthy in her brother, daughter, daughter, and sister; Heart attack (age of onset: 5) in her father; Liver cancer in her  maternal uncle. SOCIAL HISTORY:  reports that she has never smoked. She has never been exposed to tobacco smoke. She has never used smokeless tobacco. She reports that she does not currently use alcohol. She reports that she does not use drugs.  BP 136/88 (BP Location: Left Arm, Patient Position: Sitting, Cuff Size: Normal)   Pulse 80   Temp 97.6 F (36.4 C) (Temporal)   Resp (!) 95   Ht 5\' 5"  (1.651 m)   BMI 39.21 kg/m   Review of Systems:  Gen:  Denies  fever, sweats, chills weight loss  HEENT: Denies blurred vision, double vision, ear pain, eye pain, hearing loss, nose bleeds, sore throat Cardiac:  No dizziness, chest pain or heaviness, chest tightness,edema, No JVD Resp:   No cough, -sputum production, -shortness of breath,-wheezing, -hemoptysis,  Gi: Denies swallowing difficulty, stomach pain, nausea or vomiting, diarrhea, constipation, bowel incontinence Gu:  Denies bladder incontinence, burning urine Ext:   Denies Joint pain, stiffness or swelling Skin: Denies  skin rash, easy bruising or bleeding or hives Endoc:  Denies polyuria, polydipsia , polyphagia or weight change Psych:   Denies depression, insomnia or hallucinations  Other:  All other systems negative   ALL OTHER ROS ARE NEGATIVE  BP 136/88 (BP Location: Left Arm,  Patient Position: Sitting, Cuff Size: Normal)   Pulse 80   Temp 97.6 F (36.4 C) (Temporal)   Resp (!) 95   Ht 5\' 5"  (1.651 m)   BMI 39.21 kg/m     Physical Examination:   General Appearance: No distress  EYES PERRLA, EOM intact.   NECK Supple, No JVD ORAL CAVITY MALLAMPATI 4 Pulmonary: normal breath sounds, No wheezing.  CardiovascularNormal S1,S2.  No m/r/g.   Abdomen: Benign, Soft, non-tender. Skin:   warm, no rashes, no ecchymosis  Extremities: normal, no cyanosis, clubbing. Neuro:without focal findings,  speech normal  PSYCHIATRIC: Mood, affect within normal limits.   ALL OTHER ROS ARE NEGATIVE  ASSESSMENT AND  PLAN SYNOPSIS  78 year old pleasant white female seen today for assessment of asthma and obstructive sleep apnea in the setting of obesity and deconditioned state  Assessment of OSA Previous HST reveals AHI of 13 CPAP download reviewed in detail Patient has been on CPAP therapy for many years AHI is reduced to 6.7 with auto CPAP 5-15 Patient needs a new machine and a new mask patient has significant leaks Referral to DME company [id number] M[employer] Recommend DME referral for new machine Will prescribe auto CPAP 4-11  Patient Instructions  Continue to use CPAP every night, minimum of 4-6 hours a night.  Change equipment every 30 days or as directed by DME.  Wash your tubing with warm soap and water daily, hang to dry. Wash humidifier portion weekly. Use bottled, distilled water and change daily   Be aware of reduced alertness and do not drive or operate heavy machinery if experiencing this or drowsiness.  Exercise encouraged, as tolerated. Encouraged proper weight management.  Important to get eight or more hours of sleep  Limiting the use of the computer and television before bedtime.  Decrease naps during the day, so night time sleep will become enhanced.  Limit caffeine, and sleep deprivation.  HTN, stroke, uncontrolled diabetes and heart failure are potential risk factors.  Risk of untreated sleep apnea including cardiac arrhthymias, stroke, DM, pulm HTN.    Obesity -recommend significant weight loss -recommend changing diet  Deconditioned state -Recommend increased daily activity and exercise  Asthma well-controlled No evidence of exacerbation at this time Avoid Allergens and Irritants Avoid secondhand smoke Avoid SICK contacts Recommend  Masking  when appropriate Recommend Keep up-to-date with vaccinations Continue to use inhalers as needed  MEDICATION ADJUSTMENTS/LABS AND TESTS ORDERED: Referral to DME company [id number]  MASK Recommend Auto CPAP 4-11 cm h20 Referral to DME for new machine Highly recommend CPAP cleaner and sanitizing machine for maintenance and cleansing Continue to use inhalers as needed   CURRENT MEDICATIONS REVIEWED AT LENGTH WITH PATIENT TODAY   Patient  satisfied with Plan of action and management. All questions answered  Follow up  3 months   I spent a total of  65 minutes reviewing chart data, face-to-face evaluation with the patient, counseling and coordination of care as detailed above.    Lucie Leather, M.D.  [facility]  Medical Director [facility] Medical Director [facility] Cardio-Pulmonary Department

## 2023-05-18 NOTE — Patient Instructions (Signed)
 Referral to DME company [id number]  Recommend Auto CPAP 4-11 cm h20  Referral to DME for new machine   Highly recommend CPAP cleaner and sanitizing machine for maintenance and cleansing  Continue to use inhalers as needed Avoid Allergens and Irritants Avoid secondhand smoke Avoid SICK contacts Recommend  Masking  when appropriate Recommend Keep up-to-date with vaccinations

## 2023-05-26 ENCOUNTER — Ambulatory Visit: Payer: Medicare Other | Attending: Physician Assistant | Admitting: Physician Assistant

## 2023-05-26 DIAGNOSIS — M170 Bilateral primary osteoarthritis of knee: Secondary | ICD-10-CM

## 2023-05-26 MED ORDER — LIDOCAINE HCL 1 % IJ SOLN
1.5000 mL | INTRAMUSCULAR | Status: AC | PRN
  Administered 2023-05-26: 1.5 mL

## 2023-05-26 MED ORDER — HYALURONAN 30 MG/2ML IX SOSY
30.0000 mg | PREFILLED_SYRINGE | INTRA_ARTICULAR | Status: AC | PRN
  Administered 2023-05-26: 30 mg via INTRA_ARTICULAR

## 2023-05-26 MED ORDER — LIDOCAINE HCL 1 % IJ SOLN
1.5000 mL | INTRAMUSCULAR | Status: AC | PRN
Start: 2023-05-26 — End: 2023-05-26
  Administered 2023-05-26: 1.5 mL

## 2023-05-26 NOTE — Progress Notes (Signed)
   Procedure Note  Patient: Genessa Beman Crosslin McDill             Date of Birth: [birth year]           MRN: [id number]             Visit Date: 05/26/2023  Procedures: Visit Diagnoses:  1. Primary osteoarthritis of both knees    Orthovisc #2 bilateral knees, B/B Large Joint Inj: bilateral knee on 05/26/2023 1:51 PM Indications: pain Details: 25 G 1.5 in needle, medial approach  Arthrogram: No  Medications (Right): 1.5 mL lidocaine 1 %; 30 mg Hyaluronan 30 MG/2ML Aspirate (Right): 0 mL Medications (Left): 1.5 mL lidocaine 1 %; 30 mg Hyaluronan 30 MG/2ML Aspirate (Left): 0 mL Outcome: tolerated well, no immediate complications Procedure, treatment alternatives, risks and benefits explained, specific risks discussed. Consent was given by the patient. Immediately prior to procedure a time out was called to verify the correct patient, procedure, equipment, support staff and site/side marked as required.      Patient tolerated the procedures well.  Aftercare was discussed.  Sherron Ales, PA-C

## 2023-05-27 ENCOUNTER — Telehealth (INDEPENDENT_AMBULATORY_CARE_PROVIDER_SITE_OTHER): Payer: Self-pay | Admitting: Otolaryngology

## 2023-05-27 NOTE — Telephone Encounter (Signed)
 Reminder Call:  Date: 05/28/2023 Status: Sch  Time: 11:00 AM Confirmed time and location w/patient-[id number] [place], [place] [address]

## 2023-05-28 ENCOUNTER — Encounter (INDEPENDENT_AMBULATORY_CARE_PROVIDER_SITE_OTHER): Payer: Self-pay

## 2023-05-28 ENCOUNTER — Ambulatory Visit (INDEPENDENT_AMBULATORY_CARE_PROVIDER_SITE_OTHER): Payer: Medicare Other | Admitting: Otolaryngology

## 2023-05-28 VITALS — BP 143/82 | HR 86 | Ht 65.0 in | Wt 232.0 lb

## 2023-05-28 DIAGNOSIS — R2689 Other abnormalities of gait and mobility: Secondary | ICD-10-CM

## 2023-05-28 DIAGNOSIS — R42 Dizziness and giddiness: Secondary | ICD-10-CM

## 2023-05-28 DIAGNOSIS — J342 Deviated nasal septum: Secondary | ICD-10-CM

## 2023-05-28 DIAGNOSIS — J324 Chronic pansinusitis: Secondary | ICD-10-CM | POA: Diagnosis not present

## 2023-05-28 DIAGNOSIS — J343 Hypertrophy of nasal turbinates: Secondary | ICD-10-CM | POA: Diagnosis not present

## 2023-05-28 DIAGNOSIS — H938X3 Other specified disorders of ear, bilateral: Secondary | ICD-10-CM | POA: Diagnosis not present

## 2023-05-28 DIAGNOSIS — H608X3 Other otitis externa, bilateral: Secondary | ICD-10-CM

## 2023-05-28 DIAGNOSIS — R0981 Nasal congestion: Secondary | ICD-10-CM

## 2023-05-28 DIAGNOSIS — H903 Sensorineural hearing loss, bilateral: Secondary | ICD-10-CM

## 2023-05-28 MED ORDER — AMOXICILLIN-POT CLAVULANATE 875-125 MG PO TABS: 1.0000 | ORAL_TABLET | Freq: Two times a day (BID) | ORAL | 0 refills | Status: AC

## 2023-05-28 NOTE — Progress Notes (Signed)
 Dear Dr. Chestine Spore, Here is my assessment for our mutual patient, Molly Wall. Thank you for allowing me the opportunity to care for your patient. Please do not hesitate to contact me should you have any other questions. Sincerely, Dr. Jovita Kussmaul  Otolaryngology Clinic Note  HISTORY: Molly Wall is a 78 y.o. female with kindly referred by Dr. Chestine Spore for evaluation of chronic sinusitis and ear complaints.  Initial visit (2024): Patient reports: last two years have had bilateral ear fullness. Ears itch every day, and has been using q-tips to help. Hydrogen peroxide helps. She reports some unsteadiness (which is intermittent) but no vertigo consistently. She did have one episode of vertigo (which reportedly lasted two weeks) and some nausea, but this has resolved - she did not have tinnitus or hearing change or other symptoms during this. Did not try suppressants. No prior URI symptoms - primarily just lightheadedness now. She does report her sugars are quite labile.  Patient currently denies: ear pain, vertigo, drainage, tinnitus Patient additionally denies: deep pain in ear canal, eustachian tube symptoms Patient also denies barotrauma, vestibular suppressant use, ototoxic medication use Prior ear surgery: no  Never had eczema.  From sinus standpoint, She reports that she has had episodes of chronic sinusitis for two years, bilateral max pressure, frontal pressure, headaches. Some coughing (thick mucus), more recurrent now. No PND or discolored drainage anteriorly. Sense of smell comes and goes. No nasal obstruction. Symptom severity is moderate. Improvement occurred with flonase. Never tried astelin. She has tried Rinses in the past (Neti pot) with some benefit. Additional evaluation has included - CT. No typical AR symptoms. Allergy testing has not been done. No previous sinonasal surgery. Never had abx and steroids for this but has wanted some.  She is currently using  Singulair, flonsae - it does seem to help.  --------------------------------------------------------- 05/28/2023 Ear symptoms have resolved, no itching fullness, pain, vertigo, drainage, tinnitus. Did do ofloxacin drops which helped. She says she is doing ok from sinus standpoint; not rinsing but doing astelin and flonase. No PND, discolored drainage. Some maxillary sinus pressure. Typically has problems in the spring, and winter is generally best. We had a long discussion re: surgery and she is interested in proceeding. 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  Audio (01/28/2023): reviewed independently and agree with read: A/A tymps Right ear: Normal hearing sensitivity from [phone] sloping to moderately-severe sensorineural hearing loss from 4000-8000 Hz. Left ear: Normal hearing sensitivity from [phone] sloping to severe sensorineural hearing loss from 3000-8000 Hz Appears symmetric, small ABG higher frequencies, slight (<10dB asymmetry)  Right ear: 100% was obtained at a presentation level of 65 dBHL which is deemed as excellent understanding. Left ear: 96% was obtained at a presentation level of 70 dBHL with contralateral masking which is deemed as excellent understanding  CT Sinus [facility] 04/09/2023 independently reviewed and interpreted:  [facility] pattern sinusitis; right septal spur, bilateral inferior turbinate hypertrophy  Past Medical History:  Diagnosis Date   Adventitious  breath sounds 11/20/2021   Allergic rhinitis    Allergy    Arthritis    bilateral knees   Asthma    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   Breast cancer (HCC)    Diabetes mellitus without complication (HCC)    Family history of breast cancer    Family history of colon cancer    Hypercholesteremia    Hypothyroidism    Lumbar herniated disc    OSA (obstructive sleep apnea)    used to use CPAP, not any more over 1 1/2 yrs   Osteoarthritis    Salivary stone    left side   Sleep apnea    no cpap in 2 yrs    Vitamin D deficiency    Past Surgical History:  Procedure Laterality Date   APPENDECTOMY     BREAST LUMPECTOMY Right    per pt - precancerous   BREAST LUMPECTOMY WITH RADIOACTIVE SEED LOCALIZATION Right 08/12/2017   Procedure: RIGHT BREAST LUMPECTOMY X'S 2 WITH RADIOACTIVE SEED LOCALIZATION X'S 2;  Surgeon: Claud Kelp, MD;  Location: [facility];  Service: General;  Laterality: Right;   COLONOSCOPY     11 yrs ago in [place] - polyps but given a 10 yr recall per pt    GANGLION CYST EXCISION     78 years old   INCISION AND DRAINAGE ABSCESS ANAL     KNEE SURGERY  2014   SHOULDER SURGERY  1976   TONSILLECTOMY     age 37   Family History  Problem Relation Age of Onset   Alzheimer's disease Mother    Asthma Mother    Dementia Mother        cause of death   Arthritis Father    Heart attack Father 40       cause of death   Breast cancer Sister 24   Healthy Sister    Healthy Brother    Liver cancer Maternal Uncle    Colon cancer Paternal Grandmother    Healthy Daughter    Healthy Daughter    Colon polyps Neg Hx    Rectal cancer Neg Hx    Stomach cancer Neg Hx    Esophageal cancer Neg Hx    Social History   Tobacco Use   Smoking status: Never    Passive exposure: Never   Smokeless  tobacco: Never  Substance Use Topics   Alcohol use: Not Currently    Comment: rarely   Allergies  Allergen Reactions   Iodine-131 Anaphylaxis    Pt states when she was 21=she drank an experimental Iodinated Radioisotope for her thyroid. She described it was a small amount in a milk carton in [place]. She states they stopped using it shortly after because the trial wasn't successful. SPM   Prednisone     Make sugar jump way up.   Current Outpatient Medications  Medication Sig Dispense Refill   albuterol (VENTOLIN HFA) 108 (90 Base) MCG/ACT inhaler TAKE 2 PUFFS BY MOUTH EVERY 6 HOURS AS NEEDED FOR WHEEZE/SHORTNESS OF BREATH 8.5 each 1   amoxicillin-clavulanate (AUGMENTIN) 875-125 MG tablet Take 1 tablet by mouth 2 (two) times daily for 10 days. 20 tablet 0   atorvastatin (LIPITOR) 20 MG tablet TAKE 1 TABLET BY MOUTH EVERY DAY FOR CHOLESTEROL 90 tablet 2   BD PEN NEEDLE NANO 2ND GEN 32G X 4 MM MISC USE AS DIRECTED 3 TIMES A DAY 100 each 3   Continuous Blood Gluc Receiver (DEXCOM G6 RECEIVER)  DEVI Use to monitor blood sugar 1 each 0   Continuous Glucose Sensor (DEXCOM G7 SENSOR) MISC Use to check glucose continuously,change every 10 days Dx Code: E11.65 9 each 4   Continuous Glucose Transmitter (DEXCOM G6 TRANSMITTER) MISC REPLACE EVERY 3 (THREE) MONTHS. 1 each 3   fluticasone (FLONASE) 50 MCG/ACT nasal spray INSTILL 1 SPRAY IN EACH NOSTRIL TWICE A DAY AS NEEDED FOR ALLERGIES OR RHINITIS 48 mL 2   gabapentin (NEURONTIN) 100 MG capsule Take 1-3 capsules by mouth once to twice daily for pain. 90 capsule 0   Glucagon 3 MG/DOSE POWD Place 3 mg into the nose once as needed for up to 1 dose. 1 each 11   glucose blood (ONETOUCH VERIO) test strip Test blood sugar up to three times daily for diabetes. 300 strip 1   insulin aspart (NOVOLOG FLEXPEN) 100 UNIT/ML FlexPen Inject 25 Units into the skin 3 (three) times daily before meals. 15 mL 11   Insulin Disposable Pump (V-GO 30) 30 UNIT/24HR KIT USE AS  DIRECTED 30 kit 5   insulin lispro (HUMALOG KWIKPEN) 100 UNIT/ML KwikPen INJECT 25 UNITS INTO THE SKIN 3 (THREE) TIMES DAILY WITH MEALS 15 mL PRN   levothyroxine (SYNTHROID) 88 MCG tablet TAKE 1 AND 1/2 TABLET BY MOUTH EVERY SUNDAY. TAKE 1 TABLET BY MOUTH MON THROUGH SAT. TAKE ON AN EMPTY STOMACH WITH WATER ONLY. NO FOOD OR OTHER MEDICATIONS FOR 30 MINUTES. 96 tablet 2   metFORMIN (GLUCOPHAGE-XR) 500 MG 24 hr tablet TAKE 2 TABLETS (1,000 MG TOTAL) BY MOUTH DAILY WITH BREAKFAST. FOR DIABETES. 180 tablet 1   montelukast (SINGULAIR) 10 MG tablet TAKE 1 TAB BY MOUTH AT BEDTIME FOR ALLERGIES AND ASTHMA. 90 tablet 2   PREBIOTIC PRODUCT PO Take 1 capsule by mouth daily.     Semaglutide, 1 MG/DOSE, (OZEMPIC, 1 MG/DOSE,) 4 MG/3ML SOPN INJECT 1MG  INTO THE SKIN ONCE A WEEK 9 mL 4   WIXELA INHUB 250-50 MCG/ACT AEPB INHALE 1 PUFF BY MOUTH TWICE A DAY (Patient taking differently: Inhale 1 puff into the lungs daily.) 60 each 9   No current facility-administered medications for this visit.   BP (!) 143/82 (BP Location: Left Arm, Patient Position: Sitting, Cuff Size: Large)   Pulse 86   Ht 5\' 5"  (1.651 m)   Wt 232 lb (105.2 kg)   SpO2 95%   BMI 38.61 kg/m   PHYSICAL EXAM:  BP (!) 143/82 (BP Location: Left Arm, Patient Position: Sitting, Cuff Size: Large)   Pulse 86   Ht 5\' 5"  (1.651 m)   Wt 232 lb (105.2 kg)   SpO2 95%   BMI 38.61 kg/m    Salient findings:  CN II-XII intact  Bilateral EAC clear and TM intact with well pneumatized middle ear spaces; no eczematoid change today Weber 512: midline Rinne 512: AC > BC b/l  No gross nystagmus; EOM intact Nose: Anterior rhinoscopy reveals modest septal deviation and bilateral inferior turbinate hypertrophy; no purulence today No lesions of oral cavity/oropharynx; dentition fair No obviously palpable neck masses/lymphadenopathy/thyromegaly No respiratory distress or stridor   PROCEDURE: Diagnostic Nasal Endoscopy Prior, not today Description of  Procedure:  Patient was identified. A rigid 30 degree endoscope was utilized to evaluate the sinonasal cavities, mucosa, sinus ostia and turbinates and septum.  Overall, signs of mucosal inflammation are noted with trace mucoid secretions over left MM.  No mucopurulence, polyps, or masses noted.   Right Middle meatus: mucoid secretions (some) Right SE Recess: clear Left MM: clear Left  SE Recess: clear   Photodocumentation was obtained.  CPT CODE -- 31231 - Mod 25   ASSESSMENT:  78 yo w/ T2DM on insulin pump with: 1. Chronic pansinusitis   2. Hypertrophy of both inferior nasal turbinates   3. Nasal septal deviation   4. Ear fullness, bilateral   5. Imbalance   6. Chronic eczematous otitis externa of both ears   7. Vertigo   8. Bilateral sensorineural hearing loss   9. Nasal congestion     Sinus complaints: She appears to have chronic pressure and cardinal chronic sinusitis symptoms as well as CT findings corroborating this despite medical management with sprays, rinses, and antibiotics. No frequent typical AR symptoms.  Given persistence, we discussed options including FESS. We discussed goals of sinus surgery, and expectations for postoperative management. We discussed R/B/A including pain, infection, bleeding (~5% risk of operative visit for control), persistent symptoms, need for revision surgery, and other risks including damage to the eye and loss of vision, and injury to skull base with risk of CSF leak and additional intracranial complications, anesthetic complications, among others.  Patient understands and is ready to proceed.  Will schedule for bilateral FESS (max, ethmoid, frontal) with possible septoplasty and bilateral inferior turbinate reduction. Will do pre-op abx, but avoid steroids given no polyps and diabetes.  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    Dispense:  20 tablet    Refill:  0     Thank you for allowing me the opportunity to care for your patient. Please do not hesitate to contact me should you have any other questions.  Sincerely, Jovita Kussmaul, MD Otolaryngologist (ENT), [facility] Phone: [phone] Fax: [phone]  05/28/2023, 12:51 PM   MDM:  Level 4: 99214 Complexity/Problems addressed: mod - multiple chronic problems Data complexity: mod - independent interpretation of CT imaging - Morbidity: mod - decision for surgery  - Prescription Drug prescribed or managed: yes

## 2023-05-28 NOTE — Patient Instructions (Addendum)
 Use flonase two sprays each nostril twice daily and use astelin two sprays each nostril twice daily Stop ozempic ONE WEEK BEFORE SURGERY Lloyd Huger Med Nasal Saline Rinse  - start nasal saline rinses with NeilMed Bottle available over the counter    Nasal Saline Irrigation instructions: If you choose to make your own salt water solution, You will need: Salt (kosher, canning, or pickling salt) Baking soda Nasal irrigation bottle (i.e. Lloyd Huger Med Sinus Rinse) Measuring spoon ( teaspoon) Distilled / boiled water   Mix solution Mix 1 teaspoon of salt, 1/2 teaspoon of baking soda and 1 cup of water into irrigation bottle ** May use saline packet instead of homemade recipe for this step if you prefer If medicine was prescribed to be mixed with solution, place this into bottle Examples 2 inches of 2% mupirocin ointment Budesonide solution Position your head: Lean over sink (about 45 degrees) Rotate head (about 45 degrees) so that one nostril is above the other Irrigate Insert tip of irrigation bottle into upper nostril so it forms a comfortable seal Irrigate while breathing through your mouth May remove the straw from the bottle in order to irrigate the entire solution (important if medicine was added) Exhale through nose when finished and blow nose as necessary  Repeat on opposite side with other 1/2 of solution (120 mL) or remake solution if all 240 mL was used on first side Wash irrigation bottle regularly, replace every 3 months

## 2023-06-02 ENCOUNTER — Ambulatory Visit: Payer: Medicare Other | Attending: Physician Assistant | Admitting: Physician Assistant

## 2023-06-02 DIAGNOSIS — M170 Bilateral primary osteoarthritis of knee: Secondary | ICD-10-CM | POA: Diagnosis present

## 2023-06-02 MED ORDER — LIDOCAINE HCL 1 % IJ SOLN
1.5000 mL | INTRAMUSCULAR | Status: AC | PRN
  Administered 2023-06-02: 1.5 mL

## 2023-06-02 MED ORDER — HYALURONAN 30 MG/2ML IX SOSY
30.0000 mg | PREFILLED_SYRINGE | INTRA_ARTICULAR | Status: AC | PRN
  Administered 2023-06-02: 30 mg via INTRA_ARTICULAR

## 2023-06-02 NOTE — Progress Notes (Signed)
   Procedure Note  Patient: Mareesa Gathright Crosslin McDill             Date of Birth: [birth year]           MRN: [id number]             Visit Date: 06/02/2023  Procedures: Visit Diagnoses:  1. Primary osteoarthritis of both knees     Orthovisc #3 bilateral knees, B/B Large Joint Inj: bilateral knee on 06/02/2023 2:07 PM Indications: pain Details: 25 G 1.5 in needle, medial approach  Arthrogram: No  Medications (Right): 1.5 mL lidocaine 1 %; 30 mg Hyaluronan 30 MG/2ML Aspirate (Right): 0 mL Medications (Left): 1.5 mL lidocaine 1 %; 30 mg Hyaluronan 30 MG/2ML Aspirate (Left): 0 mL Outcome: tolerated well, no immediate complications Procedure, treatment alternatives, risks and benefits explained, specific risks discussed. Consent was given by the patient. Immediately prior to procedure a time out was called to verify the correct patient, procedure, equipment, support staff and site/side marked as required.     Patient tolerated the procedures well.  Aftercare was discussed.  Sherron Ales, PA-C

## 2023-06-08 ENCOUNTER — Telehealth: Payer: Self-pay | Admitting: Rheumatology

## 2023-06-08 NOTE — Telephone Encounter (Signed)
 Patient called stating the orthovisc injections didn't help her knee pain.  Patient states after the 3rd injection her knee pain has actually increased.  Patient requested a return call with the name of a good orthopedic surgeon who Dr. Corliss Skains would recommend.

## 2023-06-08 NOTE — Telephone Encounter (Signed)
 Patient advised Dr. Corliss Skains recommends seeing Dr. Magnus Ivan at [place].

## 2023-06-16 ENCOUNTER — Ambulatory Visit: Payer: Medicare Other | Admitting: Rheumatology

## 2023-06-18 ENCOUNTER — Ambulatory Visit
Admission: RE | Admit: 2023-06-18 | Discharge: 2023-06-18 | Disposition: A | Discharge status: Home / Self Care | Payer: Medicare Other | Source: Ambulatory Visit | Attending: Hematology and Oncology | Admitting: Hematology and Oncology

## 2023-06-18 DIAGNOSIS — N6091 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: Secondary | ICD-10-CM

## 2023-06-18 DIAGNOSIS — Z1231 Encounter for screening mammogram for malignant neoplasm of breast: Secondary | ICD-10-CM

## 2023-06-18 DIAGNOSIS — Z9189 Other specified personal risk factors, not elsewhere classified: Secondary | ICD-10-CM

## 2023-06-21 DIAGNOSIS — M170 Bilateral primary osteoarthritis of knee: Secondary | ICD-10-CM | POA: Insufficient documentation

## 2023-06-21 DIAGNOSIS — G4733 Obstructive sleep apnea (adult) (pediatric): Secondary | ICD-10-CM | POA: Insufficient documentation

## 2023-06-23 ENCOUNTER — Other Ambulatory Visit: Payer: Self-pay | Admitting: Hematology and Oncology

## 2023-06-23 DIAGNOSIS — R928 Other abnormal and inconclusive findings on diagnostic imaging of breast: Secondary | ICD-10-CM

## 2023-06-24 ENCOUNTER — Ambulatory Visit: Admitting: Orthopaedic Surgery

## 2023-06-24 ENCOUNTER — Telehealth: Payer: Self-pay | Admitting: *Deleted

## 2023-06-24 NOTE — Telephone Encounter (Addendum)
 This RN called pt to verify her understanding of plan per abnormal MM - she was aware of additional uni lateral scan appt but " no one explained it to me- though I have had calcifications before that had to be re evaluated "  Pt stated appreciation of call and follow up per abnormal scan.  No further questions at this time.    ----- Message from Nurse Lillia Pauls sent at 06/23/2023 11:12 AM EDT ----- She has a unil dx MM scheduled 4/18. ----- Message ----- From: Rachel Moulds, MD Sent: 06/23/2023   7:20 AM EDT To: Billey Co, RN; De [place]anch, LPN  Val or Vernona Rieger  I see that additional imaging is recommended, but I dont see it scheduled.  Thanks ----- Message ----- From: Interface, Rad Results In Sent: 06/22/2023   5:04 PM EDT To: Rachel Moulds, MD

## 2023-06-25 ENCOUNTER — Other Ambulatory Visit (HOSPITAL_COMMUNITY): Payer: Self-pay

## 2023-06-25 ENCOUNTER — Other Ambulatory Visit: Payer: Self-pay | Admitting: Family Medicine

## 2023-06-25 ENCOUNTER — Telehealth: Payer: Self-pay | Admitting: Pharmacy Technician

## 2023-06-25 DIAGNOSIS — J45909 Unspecified asthma, uncomplicated: Secondary | ICD-10-CM

## 2023-06-25 NOTE — Telephone Encounter (Signed)
 Pharmacy Patient Advocate Encounter   Received notification from CoverMyMeds that prior authorization for Ozempic (1 MG/DOSE) 4MG /3ML pen-injectors is required/requested.   Insurance verification completed.   The patient is insured through [employer] .   Per test claim: PA required; PA submitted to above mentioned insurance via Fax Key/confirmation #/EOC [id number] Status is pending Key: [id number]

## 2023-06-25 NOTE — Telephone Encounter (Signed)
 Called and spoke with patient, she does not need a refill at this time.

## 2023-06-25 NOTE — Telephone Encounter (Signed)
 Please call patient:  I received refill request for albuterol inhaler.  It looks like she was given a prescription for a 33-month supply of albuterol in February 2025 per Dr. Reece Agar.   Does she actually need a refill?  Or was this on auto refill?

## 2023-06-30 NOTE — Telephone Encounter (Signed)
 Pharmacy Patient Advocate Encounter  Received notification from [facility] that Prior Authorization for Ozempic (1 MG/DOSE) 4MG /3ML pen-injectors has been APPROVED from 06-26-2023 to 06-25-2024   PA #/Case ID/Reference #: [id number]

## 2023-07-02 ENCOUNTER — Other Ambulatory Visit: Payer: Self-pay | Admitting: Hematology and Oncology

## 2023-07-02 ENCOUNTER — Ambulatory Visit
Admission: RE | Admit: 2023-07-02 | Discharge: 2023-07-02 | Disposition: A | Discharge status: Home / Self Care | Source: Ambulatory Visit | Attending: Hematology and Oncology | Admitting: Hematology and Oncology

## 2023-07-02 DIAGNOSIS — R921 Mammographic calcification found on diagnostic imaging of breast: Secondary | ICD-10-CM

## 2023-07-02 DIAGNOSIS — R928 Other abnormal and inconclusive findings on diagnostic imaging of breast: Secondary | ICD-10-CM

## 2023-07-03 ENCOUNTER — Encounter

## 2023-07-07 NOTE — H&P (Signed)
 ORTHOPAEDIC HISTORY & PHYSICAL Formatting of this note is different from the original. NAME: Molly Wall H&P Date: 07/02/2023 Procedure Date: 07/20/2023  Chief Complaint: left knee pain and giving way  HPI Molly Wall is a 78 y.o. female who has severe Left knee pain. Patient reports a longstanding history of bilateral knee pain, with her left knee bothering her on average more than her right. She states that most of the pain localizes along the medial aspect of her knee. She does report intermittent swelling and giving way of the knee. Denies any locking symptoms. She states the pain is made worse with any prolonged standing or ambulation. It has begun to affect her ability to ambulate long distances and perform her ADLs if she would like. She has failed conservative treatment including Tylenol , activity modification and viscosupplementation injections. She does not currently utilize any ambulatory assistive devices . 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 Patient denies any cardiac history. Does have a history of asthma. No history of any DVTs or clots. She is a diabetic, her last A1c was 7.0. No history of previous surgeries on her left knee.  Social Hx: Patient states that she lives at home and has a grandson that lives upstairs as well as her daughter who lives in [place] who will be helping to help look after her postoperatively. She denies any alcohol, nicotine/smoking, illicit drug use.  Medications & Allergies Allergies: Allergies Allergen Reactions Iodine Anaphylaxis Radioactive iodine dye Cortisone Other (See Comments) Elevates blood glucose  Home Medicines: Current Outpatient Medications on File Prior to Visit Medication Sig Dispense Refill albuterol  90 mcg/actuation inhaler Inhale 2 inhalations into the lungs every 4 (four) hours as needed for Wheezing or Shortness of Breath atorvastatin  (LIPITOR) 20 MG tablet Take 1 tablet by mouth daily for  cholesterol. azelastine  (ASTELIN ) 137 mcg nasal spray Place 2 sprays into both nostrils at bedtime blood glucose diagnostic (ONETOUCH VERIO TEST STRIPS) test strip USE AS DIRECTED TO CHECK FASTING BLOOD SUGARS three times DAILY. blood-glucose sensor (DEXCOM G7 SENSOR) Devi Apply 1 each topically continuously cyanocobalamin, vitamin B-12, (VITAMIN B-12) 5,000 mcg Subl Place 1 tablet under the tongue twice a week docusate (COLACE) 100 MG capsule Take 100 mg by mouth once daily as needed for Constipation fluticasone  propion-salmeteroL (WIXELA INHUB) 250-50 mcg/dose diskus inhaler Inhale 1 Puff into the lungs every 12 (twelve) hours fluticasone  propionate (FLONASE ) 50 mcg/actuation nasal spray Place 2 sprays into both nostrils once daily as needed for Rhinitis or Allergies gabapentin  (NEURONTIN ) 100 MG capsule Take 100 mg by mouth 2 (two) times daily as needed glucagon  (BAQSIMI ) 3 mg/actuation nasal spray Place 3 mg into one nostril once HUMALOG  KWIKPEN INSULIN  pen injector (concentration 100 units/mL) Inject 25 Units subcutaneously as directed for High Blood Sugar insulin  ASPART (NOVOLOG ) 100 unit/mL injection Inject 100 Units subcutaneously as needed for High Blood Sugar lancets 33 gauge Misc Inject subcutaneously levothyroxine  (SYNTHROID ) 88 MCG tablet Take 88 mcg by mouth once daily Monday-Saturday. One and 1/2 tabs each week on Sunday meclizine  (ANTIVERT ) 12.5 mg tablet Take 12.5 mg by mouth 3 (three) times daily as needed for Dizziness metFORMIN  (GLUCOPHAGE -XR) 500 MG XR tablet Take 1,000 mg by mouth once daily montelukast  (SINGULAIR ) 10 mg tablet Take by mouth naproxen sodium (ALEVE) 220 MG tablet Take 220 mg by mouth once daily as needed for Pain OZEMPIC  1 mg/dose (4 mg/3 mL) pen injector Inject 1 mg subcutaneously once a week pen needle, diabetic (BD ULTRA-FINE MINI PEN  NEEDLE) 31 gauge x 3/16" needle USE AS DIRECTED USE DAILY TO ADMINISTER VICTOZA  V-GO 30 Devi 1 Device as directed  No  current facility-administered medications on file prior to visit.  Medical / Surgical History  Past Medical History: Diagnosis Date Asthma, unspecified asthma severity, unspecified whether complicated, unspecified whether persistent (HHS-HCC) Diabetes mellitus without complication (CMS/HHS-HCC) GERD (gastroesophageal reflux disease) 04/25/2021 History of cancer BREAST Hyperlipidemia Hypothyroidism 12/13/2014 OSA (obstructive sleep apnea) 06/21/2023 Substance abuse (CMS/HHS-HCC) Thyroid  disease Vitamin D  deficiency 07/18/2015   Past Surgical History: Procedure Laterality Date APPENDECTOMY breast mass removed right breast KNEE ARTHROSCOPY   Physical Exam  Ht:165.1 cm (5\' 5" ) Wt:(!) 108 kg (238 lb) BMI: Body mass index is 39.61 kg/m.  General/Constitutional: No apparent distress: well-nourished and well developed. Eyes: Pupils equal, round with synchronous movement. Lymphatic: No palpable adenopathy. Respiratory: Patient has good chest rise and fall with inspiration and expiration. All lung fields are clear to auscultation bilaterally. There is no Rales, rhonchi or wheezes appreciated. Cardiovascular: Upon auscultation there is a regular rate and rhythm without any murmurs, rubs, gallops or heaves appreciated. There does not appear to be any swelling down the lower extremities. Posterior tibial pulses appreciated bilaterally, 2+. Integumentary: No impressive skin lesions present, except as noted in detailed exam. Neuro/Psych: Normal mood and affect, oriented to person, place and time. Musculoskeletal: see exam below  Left knee exam Upon inspection of the patient's left knee there does not appear to be any skin changes, open abrasions, swelling or redness. There is a mild varus alignment. Upon palpation, the patient reports having pain along the medial aspect of their knee. Patient has full extension actively with ROM, and able to flex back to 112 degrees with mild pain. Varus  and valgus stress testing shows positive pseudolaxity to valgus stressing. The patella tracks well within the femoral groove from flexion into extension with noticeable crepitus. Anterior and posterior drawer testing negative. Patient is neurovascularly intact down their lower extremity to all dermatomes. Posterior tibial pulses appreciated 2+.  Imaging left Knee Imaging: None ordered today. Previous images from 06/19/2023 were reviewed. There is noticeable significant narrowing along the medial cartilage space with bone-on-bone articulation noted. Overall associated varus alignment. Subchondral sclerosis is noted. Small osteophytes are present along the medial aspect of the tibial plateau. No fractures, lytic lesions or gross fomites appreciated on films.  Assesment and Plan Knee DJD  I have recommended that Molly Wall undergo left total knee replacement. Consents has been signed. The risks, benefits, prognosis and alternatives including but not limited to DVT, PE, infection, neurovascular injury, failure of the procedure and death were explained to the patient and she is willing to proceed with surgery as described to her by myself. Plan will be for post operative admission of at least 1 midnight for pain control and PT. She will be managed with DVT prophylaxis, antibiotics preoperatively for 24 hours and aggressive in patient rehab.  Pre, intra and post op interventions were discussed. Patient has good understanding  Medication Reconciliation was performed. Discussed cessation of diabetes medications including Ozempic  and metformin , NSAIDs, vitamins and supplements.  A total of 50 minutes was spent reviewing patient's charts, medical reconciliation, discussing/educating the patient about surgical interventions, and answering any questions provided by the patient.  JOSHUA Alex Hylan, PA [facility] 07/02/2023  Electronically signed by Luz Sames, PA at  07/02/2023 5:18 PM EDT

## 2023-07-07 NOTE — Discharge Instructions (Addendum)
 Instructions after Total Knee Replacement   James P. Angie Fava., M.D.    Dept. of Orthopaedics & Sports Medicine [facility] [address] [place], [place]  [address]  Phone: [phone]   Fax: [phone]       [email or web address]       DIET: Drink plenty of non-alcoholic fluids. Resume your normal diet. Include foods high in fiber.  ACTIVITY:  You may use crutches or a walker with weight-bearing as tolerated, unless instructed otherwise. You may be weaned off of the walker or crutches by your Physical Therapist.  Do NOT place pillows under the knee. Anything placed under the knee could limit your ability to straighten the knee.   Use the Bone Foam 3 times a day for 30 minutes each session to help straighten the knee. Continue doing gentle exercises. Exercising will reduce the pain and swelling, increase motion, and prevent muscle weakness.   Please continue to use the TED compression stockings for 6 weeks. You may remove the stockings at night, but should reapply them in the morning. Do not drive or operate any equipment until instructed.  WOUND CARE:  The initial dressing (Aquacel) can remain in place for 7 days (see separate instructions). Continue to use the PolarCare or ice packs periodically to reduce pain and swelling. You may bathe or shower after the staples are removed at the first office visit following surgery.  MEDICATIONS: You may resume your regular medications. Please take the pain medication as prescribed on the medication. Do not take pain medication on an empty stomach. Unless instructed otherwise, you should take an enteric-coated aspirin 81 mg. TWICE a day. (This along with elevation will help reduce the possibility of blood clots/phlebitis in your operated leg.) Use a stool softener (such as Senokot-S or Colace) daily and a laxative (such as Miralax or Dulcolax) as needed to prevent constipation.  Do not drive or drink alcoholic beverages when  taking pain medications.  CALL THE OFFICE FOR: Temperature above 101 degrees Excessive bleeding or drainage on the dressing. Excessive swelling, coldness, or paleness of the toes. Persistent nausea and vomiting.  FOLLOW-UP:  You should have an appointment to return to the office in 10-14 days after surgery. Arrangements have been made for continuation of Physical Therapy (either home therapy or outpatient therapy).     [facility] Department Directory         [email or web address]       [email or web address]          Cardiology  Appointments: [place] - [phone]  Endocrinology  Appointments: [place] [phone] [place] - [phone]  Gastroenterology  Appointments: [place] [phone] [place] - [phone]        General Surgery   Appointments: [facility]  Internal Medicine/Family Medicine  Appointments: [facility] [place] - [phone] [place] - [phone]  Metabolic and Weigh Loss Surgery  Appointments: [facility]        Neurology  Appointments: [place] [phone] [place] - [phone]  Neurosurgery  Appointments: [place]  Obstetrics & Gynecology  Appointments: [place] [phone] [place] - [phone]        Pediatrics  Appointments: [place] [phone] [place] - [phone]  Physiatry  Appointments: [place] [phone]  Physical Therapy  Appointments: [place] - [phone]        Podiatry  Appointments: [place] [phone] [place] - [phone]  Pulmonology  Appointments: [place]  Rheumatology  Appointments: [place] [phone]  [place] Location: [facility]  [address] [place], [place]  [address]  [place] Location: [facility]. [address] [place], [place]  [address]  [place]  Location: [facility] [address] [place], [place]  [address]     [employer]  [address] [place], [place], [address]. [phone] They will call you to arrange when they can come to see you

## 2023-07-08 ENCOUNTER — Other Ambulatory Visit: Payer: Self-pay | Admitting: Internal Medicine

## 2023-07-08 DIAGNOSIS — Z794 Long term (current) use of insulin: Secondary | ICD-10-CM

## 2023-07-10 ENCOUNTER — Ambulatory Visit
Admission: RE | Admit: 2023-07-10 | Discharge: 2023-07-10 | Disposition: A | Discharge status: Home / Self Care | Source: Ambulatory Visit | Attending: Hematology and Oncology | Admitting: Hematology and Oncology

## 2023-07-10 DIAGNOSIS — R921 Mammographic calcification found on diagnostic imaging of breast: Secondary | ICD-10-CM

## 2023-07-10 HISTORY — PX: BREAST BIOPSY: SHX20

## 2023-07-13 LAB — SURGICAL PATHOLOGY

## 2023-07-14 ENCOUNTER — Other Ambulatory Visit: Payer: Self-pay

## 2023-07-14 ENCOUNTER — Encounter
Admission: RE | Admit: 2023-07-14 | Discharge: 2023-07-14 | Disposition: A | Discharge status: Home / Self Care | Source: Ambulatory Visit | Attending: Orthopedic Surgery | Admitting: Orthopedic Surgery

## 2023-07-14 VITALS — BP 164/73 | HR 88 | Resp 16 | Ht 65.0 in | Wt 241.0 lb

## 2023-07-14 DIAGNOSIS — R42 Dizziness and giddiness: Secondary | ICD-10-CM | POA: Insufficient documentation

## 2023-07-14 DIAGNOSIS — Z01818 Encounter for other preprocedural examination: Secondary | ICD-10-CM | POA: Insufficient documentation

## 2023-07-14 DIAGNOSIS — M1712 Unilateral primary osteoarthritis, left knee: Secondary | ICD-10-CM | POA: Diagnosis not present

## 2023-07-14 DIAGNOSIS — E1165 Type 2 diabetes mellitus with hyperglycemia: Secondary | ICD-10-CM | POA: Insufficient documentation

## 2023-07-14 DIAGNOSIS — Z794 Long term (current) use of insulin: Secondary | ICD-10-CM | POA: Insufficient documentation

## 2023-07-14 DIAGNOSIS — Z79899 Other long term (current) drug therapy: Secondary | ICD-10-CM | POA: Insufficient documentation

## 2023-07-14 DIAGNOSIS — Z01810 Encounter for preprocedural cardiovascular examination: Secondary | ICD-10-CM | POA: Diagnosis not present

## 2023-07-14 LAB — CBC
HCT: 42.9 % (ref 36.0–46.0)
Hemoglobin: 13.9 g/dL (ref 12.0–15.0)
MCH: 28.1 pg (ref 26.0–34.0)
MCHC: 32.4 g/dL (ref 30.0–36.0)
MCV: 86.7 fL (ref 80.0–100.0)
Platelets: 262 10*3/uL (ref 150–400)
RBC: 4.95 MIL/uL (ref 3.87–5.11)
RDW: 13.8 % (ref 11.5–15.5)
WBC: 5.8 10*3/uL (ref 4.0–10.5)
nRBC: 0 % (ref 0.0–0.2)

## 2023-07-14 LAB — URINALYSIS, ROUTINE W REFLEX MICROSCOPIC
Bilirubin Urine: NEGATIVE
Glucose, UA: NEGATIVE mg/dL
Hgb urine dipstick: NEGATIVE
Ketones, ur: 5 mg/dL — AB
Nitrite: NEGATIVE
Protein, ur: NEGATIVE mg/dL
Specific Gravity, Urine: 1.023 (ref 1.005–1.030)
pH: 5 (ref 5.0–8.0)

## 2023-07-14 LAB — COMPREHENSIVE METABOLIC PANEL WITH GFR
ALT: 16 U/L (ref 0–44)
AST: 16 U/L (ref 15–41)
Albumin: 4.1 g/dL (ref 3.5–5.0)
Alkaline Phosphatase: 90 U/L (ref 38–126)
Anion gap: 8 (ref 5–15)
BUN: 21 mg/dL (ref 8–23)
CO2: 27 mmol/L (ref 22–32)
Calcium: 9.3 mg/dL (ref 8.9–10.3)
Chloride: 101 mmol/L (ref 98–111)
Creatinine, Ser: 0.85 mg/dL (ref 0.44–1.00)
GFR, Estimated: >60 mL/min (ref >60)
Glucose, Bld: 208 mg/dL — ABNORMAL HIGH (ref 70–99)
Potassium: 4 mmol/L (ref 3.5–5.1)
Sodium: 136 mmol/L (ref 135–145)
Total Bilirubin: 0.8 mg/dL (ref 0.0–1.2)
Total Protein: 7.1 g/dL (ref 6.5–8.1)

## 2023-07-14 LAB — SURGICAL PCR SCREEN
MRSA, PCR: NEGATIVE
Staphylococcus aureus: NEGATIVE

## 2023-07-14 LAB — SEDIMENTATION RATE: Sed Rate: 14 mm/h (ref 0–30)

## 2023-07-14 LAB — HEMOGLOBIN A1C
Hgb A1c MFr Bld: 6.7 % — ABNORMAL HIGH (ref 4.8–5.6)
Mean Plasma Glucose: 145.59 mg/dL

## 2023-07-14 LAB — C-REACTIVE PROTEIN: CRP: 0.6 mg/dL (ref <1.0)

## 2023-07-14 NOTE — Patient Instructions (Addendum)
 Your procedure is scheduled on: Monday 07/20/23 To find out your arrival time, please call [phone] between 1PM - 3PM on:  Friday 07/17/23  Report to the Registration Desk on the 1st floor of the [facility]. FREE Valet parking is available.  If your arrival time is 6:00 am, do not arrive before that time as the [facility] entrance doors do not open until 6:00 am.  REMEMBER: Instructions that are not followed completely may result in serious medical risk, up to and including death; or upon the discretion of your surgeon and anesthesiologist your surgery may need to be rescheduled.  Do not eat food after midnight the night before surgery. Eat a high protein snack before bed. (Cheese peanut butter crackers hummus) No gum chewing or hard candies.  You may however, drink CLEAR liquids up to 2 hours before you are scheduled to arrive for your surgery. Do not drink anything within 2 hours of your scheduled arrival time.  Clear liquids include: - water  In addition, your doctor has ordered for you to drink the provided:  Gatorade G2 Drinking this carbohydrate drink up to two hours before surgery helps to reduce insulin  resistance and improve patient outcomes. Please complete drinking 2 hours before scheduled arrival time.  One week prior to surgery: Stop Anti-inflammatories (NSAIDS) such as Advil, Aleve, Ibuprofen, Motrin, Naproxen, Naprosyn and Aspirin based products such as Excedrin, Goody's Powder, BC Powder. You may however, continue to take Tylenol  if needed for pain up until the day of surgery.  Stop ANY OVER THE COUNTER supplements until after surgery.  Continue taking all prescribed medications with the exception of the following: Metformin  last dose before surgery will be Friday 07/17/23. Continue holding those Rx's already held until after surgery.  **Follow guidelines for insulin  and diabetes medications** Please follow up with your diabetes doctor regarding your insulin   pump  TAKE ONLY THESE MEDICATIONS THE MORNING OF SURGERY WITH A SIP OF WATER:  atorvastatin  (LIPITOR) 20 MG tablet  levothyroxine  (SYNTHROID ) 88 MCG tablet   Use inhalers on the day of surgery and bring to the hospital.  No Alcohol for 24 hours before or after surgery.  No Smoking including e-cigarettes for 24 hours before surgery.  No chewable tobacco products for at least 6 hours before surgery.  No nicotine patches on the day of surgery.  Do not use any "recreational" drugs for at least a week (preferably 2 weeks) before your surgery.  Please be advised that the combination of cocaine and anesthesia may have negative outcomes, up to and including death. If you test positive for cocaine, your surgery will be cancelled.  On the morning of surgery brush your teeth with toothpaste and water, you may rinse your mouth with mouthwash if you wish. Do not swallow any toothpaste or mouthwash.  Use CHG Soap or wipes as directed on instruction sheet.  Do not wear lotions, powders, or perfumes.   Do not shave body hair from the neck down 48 hours before surgery.  Wear comfortable clothing (specific to your surgery type) to the hospital.  Do not wear jewelry, make-up, hairpins, clips or nail polish.  For welded (permanent) jewelry: bracelets, anklets, waist bands, etc.  Please have this removed prior to surgery.  If it is not removed, there is a chance that hospital personnel will need to cut it off on the day of surgery. Contact lenses, hearing aids and dentures may not be worn into surgery.  Bring your C-PAP to the hospital in  case you may have to spend the night.   Do not bring valuables to the hospital. [facility] is not responsible for any missing/lost belongings or valuables.   Notify your doctor if there is any change in your medical condition (cold, fever, infection).  If you are being discharged the day of surgery, you will not be allowed to drive home. You will need a  responsible individual to drive you home and stay with you for 24 hours after surgery.   If you are taking public transportation, you will need to have a responsible individual with you.  If you are being admitted to the hospital overnight, leave your suitcase in the car. After surgery it may be brought to your room.  In case of increased patient census, it may be necessary for you, the patient, to continue your postoperative care in the Same Day Surgery department.  After surgery, you can help prevent lung complications by doing breathing exercises.  Take deep breaths and cough every 1-2 hours. Your doctor may order a device called an Incentive Spirometer to help you take deep breaths. When coughing or sneezing, hold a pillow firmly against your incision with both hands. This is called "splinting." Doing this helps protect your incision. It also decreases belly discomfort.  Surgery Visitation Policy:  Patients undergoing a surgery or procedure may have two family members or support persons with them as long as the person is not COVID-19 positive or experiencing its symptoms.   Inpatient Visitation:    Visiting hours are 7 a.m. to 8 p.m. Up to four visitors are allowed at one time in a patient room. The visitors may rotate out with other people during the day. One designated support person (adult) may remain overnight.  Please call the Pre-admissions Testing Dept. at [phone] if you have any questions about these instructions.    Pre-operative 5 CHG Bath Instructions   You can play a key role in reducing the risk of infection after surgery. Your skin needs to be as free of germs as possible. You can reduce the number of germs on your skin by washing with CHG (chlorhexidine  gluconate) soap before surgery. CHG is an antiseptic soap that kills germs and continues to kill germs even after washing.   DO NOT use if you have an allergy to chlorhexidine /CHG or antibacterial soaps. If your  skin becomes reddened or irritated, stop using the CHG and notify one of our RNs at [phone].   Please shower with the CHG soap starting 4 days before surgery using the following schedule:   Thursday 07/16/23 - Monday 07/20/23    Please keep in mind the following:  DO NOT shave, including legs and underarms, starting the day of your first shower.   You may shave your face at any point before/day of surgery.  Place clean sheets on your bed the day you start using CHG soap. Use a clean washcloth (not used since being washed) for each shower. DO NOT sleep with pets once you start using the CHG.   CHG Shower Instructions:  If you choose to wash your hair and private area, wash first with your normal shampoo/soap.  After you use shampoo/soap, rinse your hair and body thoroughly to remove shampoo/soap residue.  Turn the water OFF and apply about 3 tablespoons (45 ml) of CHG soap to a CLEAN washcloth.  Apply CHG soap ONLY FROM YOUR NECK DOWN TO YOUR TOES (washing for 3-5 minutes)  DO NOT use CHG soap on  face, private areas, open wounds, or sores.  Pay special attention to the area where your surgery is being performed.  If you are having back surgery, having someone wash your back for you may be helpful. Wait 2 minutes after CHG soap is applied, then you may rinse off the CHG soap.  Pat dry with a clean towel  Put on clean clothes/pajamas   If you choose to wear lotion, please use ONLY the CHG-compatible lotions on the back of this paper.     Additional instructions for the day of surgery: DO NOT APPLY any lotions, deodorants, cologne, or perfumes.   Put on clean/comfortable clothes.  Brush your teeth.  Ask your nurse before applying any prescription medications to the skin.      CHG Compatible Lotions   Aveeno Moisturizing lotion  Cetaphil Moisturizing Cream  Cetaphil Moisturizing Lotion  Clairol Herbal Essence Moisturizing Lotion, Dry Skin  Clairol Herbal Essence Moisturizing  Lotion, Extra Dry Skin  Clairol Herbal Essence Moisturizing Lotion, Normal Skin  Curel Age Defying Therapeutic Moisturizing Lotion with Alpha Hydroxy  Curel Extreme Care Body Lotion  Curel Soothing Hands Moisturizing Hand Lotion  Curel Therapeutic Moisturizing Cream, Fragrance-Free  Curel Therapeutic Moisturizing Lotion, Fragrance-Free  Curel Therapeutic Moisturizing Lotion, Original Formula  Eucerin Daily Replenishing Lotion  Eucerin Dry Skin Therapy Plus Alpha Hydroxy Crme  Eucerin Dry Skin Therapy Plus Alpha Hydroxy Lotion  Eucerin Original Crme  Eucerin Original Lotion  Eucerin Plus Crme Eucerin Plus Lotion  Eucerin TriLipid Replenishing Lotion  Keri Anti-Bacterial Hand Lotion  Keri Deep Conditioning Original Lotion Dry Skin Formula Softly Scented  Keri Deep Conditioning Original Lotion, Fragrance Free Sensitive Skin Formula  Keri Lotion Fast Absorbing Fragrance Free Sensitive Skin Formula  Keri Lotion Fast Absorbing Softly Scented Dry Skin Formula  Keri Original Lotion  Keri Skin Renewal Lotion Keri Silky Smooth Lotion  Keri Silky Smooth Sensitive Skin Lotion  Nivea Body Creamy Conditioning Oil  Nivea Body Extra Enriched Lotion  Nivea Body Original Lotion  Nivea Body Sheer Moisturizing Lotion Nivea Crme  Nivea Skin Firming Lotion  NutraDerm 30 Skin Lotion  NutraDerm Skin Lotion  NutraDerm Therapeutic Skin Cream  NutraDerm Therapeutic Skin Lotion  ProShield Protective Hand Cream  Provon moisturizing lotion  How to Use an Incentive Spirometer  An incentive spirometer is a tool that measures how well you are filling your lungs with each breath. Learning to take long, deep breaths using this tool can help you keep your lungs clear and active. This may help to reverse or lessen your chance of developing breathing (pulmonary) problems, especially infection. You may be asked to use a spirometer: After a surgery. If you have a lung problem or a history of smoking. After a  long period of time when you have been unable to move or be active. If the spirometer includes an indicator to show the highest number that you have reached, your health care provider or respiratory therapist will help you set a goal. Keep a log of your progress as told by your health care provider. What are the risks? Breathing too quickly may cause dizziness or cause you to pass out. Take your time so you do not get dizzy or light-headed. If you are in pain, you may need to take pain medicine before doing incentive spirometry. It is harder to take a deep breath if you are having pain.  How to use your incentive spirometer  Sit up on the edge of your bed or on a chair.  Hold the incentive spirometer so that it is in an upright position. Before you use the spirometer, breathe out normally. Place the mouthpiece in your mouth. Make sure your lips are closed tightly around it. Breathe in slowly and as deeply as you can through your mouth, causing the piston or the ball to rise toward the top of the chamber. Hold your breath for 3-5 seconds, or for as long as possible. If the spirometer includes a coach indicator, use this to guide you in breathing. Slow down your breathing if the indicator goes above the marked areas. Remove the mouthpiece from your mouth and breathe out normally. The piston or ball will return to the bottom of the chamber. Rest for a few seconds, then repeat the steps 10 or more times. Take your time and take a few normal breaths between deep breaths so that you do not get dizzy or light-headed. Do this every 1-2 hours when you are awake. If the spirometer includes a goal marker to show the highest number you have reached (best effort), use this as a goal to work toward during each repetition. After each set of 10 deep breaths, cough a few times. This will help to make sure that your lungs are clear. If you have an incision on your chest or abdomen from surgery, place a pillow or a  rolled-up towel firmly against the incision when you cough. This can help to reduce pain while taking deep breaths and coughing. General tips When you are able to get out of bed: Walk around often. Continue to take deep breaths and cough in order to clear your lungs. Keep using the incentive spirometer until your health care provider says it is okay to stop using it. If you have been in the hospital, you may be told to keep using the spirometer at home. Contact a health care provider if: You are having difficulty using the spirometer. You have trouble using the spirometer as often as instructed. Your pain medicine is not giving enough relief for you to use the spirometer as told. You have a fever. Get help right away if: You develop shortness of breath. You develop a cough with bloody mucus from the lungs. You have fluid or blood coming from an incision site after you cough. Summary An incentive spirometer is a tool that can help you learn to take long, deep breaths to keep your lungs clear and active. You may be asked to use a spirometer after a surgery, if you have a lung problem or a history of smoking, or if you have been inactive for a long period of time. Use your incentive spirometer as instructed every 1-2 hours while you are awake. If you have an incision on your chest or abdomen, place a pillow or a rolled-up towel firmly against your incision when you cough. This will help to reduce pain. Get help right away if you have shortness of breath, you cough up bloody mucus, or blood comes from your incision when you cough. This information is not intended to replace advice given to you by your health care provider. Make sure you discuss any questions you have with your health care provider. Document Revised: 05/23/2019 Document Reviewed: 05/23/2019 Elsevier Patient Education  2023 Else[employer].  Preoperative Educational Videos for Total Hip, Knee and Shoulder Replacements  To better  prepare for surgery, please view our videos that explain the physical activity and discharge planning required to have the best surgical recovery at [facility].  [email or web address]  Questions? Call [phone] or email [email or web address]

## 2023-07-15 LAB — URINE CULTURE: Culture: <10000 — AB

## 2023-07-19 ENCOUNTER — Encounter: Payer: Self-pay | Admitting: Orthopedic Surgery

## 2023-07-20 ENCOUNTER — Ambulatory Visit: Payer: Self-pay | Admitting: Urgent Care

## 2023-07-20 ENCOUNTER — Other Ambulatory Visit: Payer: Self-pay

## 2023-07-20 ENCOUNTER — Encounter
Admission: RE | Disposition: A | Discharge status: Home / Self Care | Payer: Self-pay | Source: Home / Self Care | Attending: Orthopedic Surgery

## 2023-07-20 ENCOUNTER — Encounter: Payer: Self-pay | Admitting: Orthopedic Surgery

## 2023-07-20 ENCOUNTER — Observation Stay

## 2023-07-20 ENCOUNTER — Observation Stay
Admission: RE | Admit: 2023-07-20 | Discharge: 2023-07-21 | Disposition: A | Discharge status: Home / Self Care | Attending: Orthopedic Surgery | Admitting: Orthopedic Surgery

## 2023-07-20 DIAGNOSIS — E119 Type 2 diabetes mellitus without complications: Secondary | ICD-10-CM | POA: Insufficient documentation

## 2023-07-20 DIAGNOSIS — Z794 Long term (current) use of insulin: Secondary | ICD-10-CM | POA: Diagnosis not present

## 2023-07-20 DIAGNOSIS — M1712 Unilateral primary osteoarthritis, left knee: Secondary | ICD-10-CM | POA: Diagnosis present

## 2023-07-20 DIAGNOSIS — Z853 Personal history of malignant neoplasm of breast: Secondary | ICD-10-CM | POA: Insufficient documentation

## 2023-07-20 DIAGNOSIS — Z96652 Presence of left artificial knee joint: Secondary | ICD-10-CM

## 2023-07-20 DIAGNOSIS — Z7984 Long term (current) use of oral hypoglycemic drugs: Secondary | ICD-10-CM | POA: Insufficient documentation

## 2023-07-20 DIAGNOSIS — R42 Dizziness and giddiness: Secondary | ICD-10-CM

## 2023-07-20 DIAGNOSIS — Z01812 Encounter for preprocedural laboratory examination: Secondary | ICD-10-CM

## 2023-07-20 DIAGNOSIS — Z79899 Other long term (current) drug therapy: Secondary | ICD-10-CM | POA: Diagnosis not present

## 2023-07-20 DIAGNOSIS — E039 Hypothyroidism, unspecified: Secondary | ICD-10-CM | POA: Diagnosis not present

## 2023-07-20 DIAGNOSIS — J45909 Unspecified asthma, uncomplicated: Secondary | ICD-10-CM | POA: Diagnosis not present

## 2023-07-20 DIAGNOSIS — R8271 Bacteriuria: Secondary | ICD-10-CM

## 2023-07-20 DIAGNOSIS — E1165 Type 2 diabetes mellitus with hyperglycemia: Secondary | ICD-10-CM

## 2023-07-20 DIAGNOSIS — R8290 Unspecified abnormal findings in urine: Secondary | ICD-10-CM

## 2023-07-20 HISTORY — PX: KNEE ARTHROPLASTY: SHX992

## 2023-07-20 LAB — GLUCOSE, CAPILLARY
Glucose-Capillary: 232 mg/dL — ABNORMAL HIGH (ref 70–99)
Glucose-Capillary: 279 mg/dL — ABNORMAL HIGH (ref 70–99)
Glucose-Capillary: 305 mg/dL — ABNORMAL HIGH (ref 70–99)
Glucose-Capillary: 334 mg/dL — ABNORMAL HIGH (ref 70–99)
Glucose-Capillary: 412 mg/dL — ABNORMAL HIGH (ref 70–99)

## 2023-07-20 LAB — GLUCOSE, RANDOM: Glucose, Bld: 451 mg/dL — ABNORMAL HIGH (ref 70–99)

## 2023-07-20 SURGERY — ARTHROPLASTY, KNEE, TOTAL, USING IMAGELESS COMPUTER-ASSISTED NAVIGATION
Anesthesia: Spinal | Site: Knee | Laterality: Left

## 2023-07-20 MED ORDER — METFORMIN HCL 500 MG PO TABS
ORAL_TABLET | ORAL | Status: AC
  Filled 2023-07-20: qty 2

## 2023-07-20 MED ORDER — SODIUM CHLORIDE 0.9 % IR SOLN
Status: DC | PRN
  Administered 2023-07-20: 3000 mL

## 2023-07-20 MED ORDER — SODIUM CHLORIDE 0.9 % IV SOLN: INTRAVENOUS | Status: DC

## 2023-07-20 MED ORDER — SENNOSIDES-DOCUSATE SODIUM 8.6-50 MG PO TABS
1.0000 | ORAL_TABLET | Freq: Two times a day (BID) | ORAL | Status: DC
  Administered 2023-07-20 – 2023-07-21 (×2): 1 via ORAL
  Filled 2023-07-20 (×3): qty 1

## 2023-07-20 MED ORDER — CEFAZOLIN SODIUM-DEXTROSE 2-4 GM/100ML-% IV SOLN
2.0000 g | Freq: Four times a day (QID) | INTRAVENOUS | Status: AC
  Administered 2023-07-20 (×2): 2 g via INTRAVENOUS
  Filled 2023-07-20: qty 100

## 2023-07-20 MED ORDER — ONDANSETRON HCL 4 MG/2ML IJ SOLN: 4.0000 mg | Freq: Four times a day (QID) | INTRAMUSCULAR | Status: DC | PRN

## 2023-07-20 MED ORDER — FLUTICASONE FUROATE-VILANTEROL 200-25 MCG/ACT IN AEPB
1.0000 | INHALATION_SPRAY | Freq: Every day | RESPIRATORY_TRACT | Status: DC
  Filled 2023-07-20: qty 28

## 2023-07-20 MED ORDER — PROPOFOL 1000 MG/100ML IV EMUL
INTRAVENOUS | Status: AC
  Filled 2023-07-20: qty 100

## 2023-07-20 MED ORDER — OXYCODONE HCL 5 MG/5ML PO SOLN: 5.0000 mg | Freq: Once | ORAL | Status: AC | PRN

## 2023-07-20 MED ORDER — CHLORHEXIDINE GLUCONATE 0.12 % MT SOLN
OROMUCOSAL | Status: AC
  Filled 2023-07-20: qty 15

## 2023-07-20 MED ORDER — DIPHENHYDRAMINE HCL 12.5 MG/5ML PO ELIX: 12.5000 mg | ORAL_SOLUTION | ORAL | Status: DC | PRN

## 2023-07-20 MED ORDER — FENTANYL CITRATE (PF) 100 MCG/2ML IJ SOLN
INTRAMUSCULAR | Status: AC
  Filled 2023-07-20: qty 2

## 2023-07-20 MED ORDER — MIDAZOLAM HCL 2 MG/2ML IJ SOLN
INTRAMUSCULAR | Status: AC
  Filled 2023-07-20: qty 2

## 2023-07-20 MED ORDER — ACETAMINOPHEN 10 MG/ML IV SOLN
INTRAVENOUS | Status: AC
  Filled 2023-07-20: qty 100

## 2023-07-20 MED ORDER — ORAL CARE MOUTH RINSE: 15.0000 mL | Freq: Once | OROMUCOSAL | Status: AC

## 2023-07-20 MED ORDER — CHLORHEXIDINE GLUCONATE 4 % EX SOLN
60.0000 mL | Freq: Once | CUTANEOUS | Status: AC
  Administered 2023-07-20: 4 via TOPICAL

## 2023-07-20 MED ORDER — MENTHOL 3 MG MT LOZG: 1.0000 | LOZENGE | OROMUCOSAL | Status: DC | PRN

## 2023-07-20 MED ORDER — ATORVASTATIN CALCIUM 20 MG PO TABS
20.0000 mg | ORAL_TABLET | Freq: Every day | ORAL | Status: DC
  Administered 2023-07-21: 20 mg via ORAL
  Filled 2023-07-20: qty 2

## 2023-07-20 MED ORDER — CEFAZOLIN SODIUM-DEXTROSE 2-4 GM/100ML-% IV SOLN
INTRAVENOUS | Status: AC
  Filled 2023-07-20: qty 100

## 2023-07-20 MED ORDER — BUPIVACAINE HCL (PF) 0.25 % IJ SOLN
INTRAMUSCULAR | Status: DC | PRN
  Administered 2023-07-20: 60 mL

## 2023-07-20 MED ORDER — PHENYLEPHRINE 80 MCG/ML (10ML) SYRINGE FOR IV PUSH (FOR BLOOD PRESSURE SUPPORT)
PREFILLED_SYRINGE | INTRAVENOUS | Status: AC
  Filled 2023-07-20: qty 10

## 2023-07-20 MED ORDER — ACETAMINOPHEN 10 MG/ML IV SOLN
1000.0000 mg | Freq: Four times a day (QID) | INTRAVENOUS | Status: AC
  Administered 2023-07-20 – 2023-07-21 (×4): 1000 mg via INTRAVENOUS
  Filled 2023-07-20 (×3): qty 100

## 2023-07-20 MED ORDER — FENTANYL CITRATE (PF) 100 MCG/2ML IJ SOLN: 25.0000 ug | INTRAMUSCULAR | Status: DC | PRN

## 2023-07-20 MED ORDER — FERROUS SULFATE 325 (65 FE) MG PO TABS
325.0000 mg | ORAL_TABLET | Freq: Two times a day (BID) | ORAL | Status: DC
  Administered 2023-07-20 – 2023-07-21 (×2): 325 mg via ORAL
  Filled 2023-07-20 (×2): qty 1

## 2023-07-20 MED ORDER — DEXAMETHASONE SODIUM PHOSPHATE 10 MG/ML IJ SOLN
INTRAMUSCULAR | Status: AC
  Filled 2023-07-20: qty 1

## 2023-07-20 MED ORDER — GABAPENTIN 300 MG PO CAPS
300.0000 mg | ORAL_CAPSULE | Freq: Once | ORAL | Status: AC
  Administered 2023-07-20: 300 mg via ORAL

## 2023-07-20 MED ORDER — ALBUTEROL SULFATE HFA 108 (90 BASE) MCG/ACT IN AERS: 2.0000 | INHALATION_SPRAY | Freq: Four times a day (QID) | RESPIRATORY_TRACT | Status: DC | PRN

## 2023-07-20 MED ORDER — MONTELUKAST SODIUM 10 MG PO TABS
10.0000 mg | ORAL_TABLET | Freq: Every day | ORAL | Status: DC
  Administered 2023-07-20: 10 mg via ORAL
  Filled 2023-07-20: qty 1

## 2023-07-20 MED ORDER — ACETAMINOPHEN 325 MG PO TABS: 325.0000 mg | ORAL_TABLET | Freq: Four times a day (QID) | ORAL | Status: DC | PRN

## 2023-07-20 MED ORDER — ACETAMINOPHEN 10 MG/ML IV SOLN
INTRAVENOUS | Status: DC | PRN
  Administered 2023-07-20: 1000 mg via INTRAVENOUS

## 2023-07-20 MED ORDER — ONDANSETRON HCL 4 MG PO TABS: 4.0000 mg | ORAL_TABLET | Freq: Four times a day (QID) | ORAL | Status: DC | PRN

## 2023-07-20 MED ORDER — MAGNESIUM HYDROXIDE 400 MG/5ML PO SUSP
30.0000 mL | Freq: Every day | ORAL | Status: DC
  Administered 2023-07-20 – 2023-07-21 (×2): 30 mL via ORAL
  Filled 2023-07-20 (×2): qty 30

## 2023-07-20 MED ORDER — INSULIN ASPART 100 UNIT/ML IJ SOLN: 0.0000 [IU] | Freq: Three times a day (TID) | INTRAMUSCULAR | Status: DC

## 2023-07-20 MED ORDER — METFORMIN HCL ER 500 MG PO TB24
ORAL_TABLET | ORAL | Status: AC
  Filled 2023-07-20: qty 2

## 2023-07-20 MED ORDER — OXYCODONE HCL 5 MG PO TABS
ORAL_TABLET | ORAL | Status: AC
  Filled 2023-07-20: qty 1

## 2023-07-20 MED ORDER — ALUM & MAG HYDROXIDE-SIMETH 200-200-20 MG/5ML PO SUSP: 30.0000 mL | ORAL | Status: DC | PRN

## 2023-07-20 MED ORDER — PHENYLEPHRINE 80 MCG/ML (10ML) SYRINGE FOR IV PUSH (FOR BLOOD PRESSURE SUPPORT)
PREFILLED_SYRINGE | INTRAVENOUS | Status: DC | PRN
  Administered 2023-07-20 (×4): 80 ug via INTRAVENOUS

## 2023-07-20 MED ORDER — PHENYLEPHRINE HCL-NACL 20-0.9 MG/250ML-% IV SOLN
INTRAVENOUS | Status: DC | PRN
  Administered 2023-07-20: 30 ug/min via INTRAVENOUS

## 2023-07-20 MED ORDER — PHENYLEPHRINE HCL-NACL 20-0.9 MG/250ML-% IV SOLN
INTRAVENOUS | Status: AC
  Filled 2023-07-20: qty 250

## 2023-07-20 MED ORDER — PHENOL 1.4 % MT LIQD: 1.0000 | OROMUCOSAL | Status: DC | PRN

## 2023-07-20 MED ORDER — ASPIRIN 81 MG PO CHEW
81.0000 mg | CHEWABLE_TABLET | Freq: Two times a day (BID) | ORAL | Status: DC
  Administered 2023-07-20 – 2023-07-21 (×2): 81 mg via ORAL
  Filled 2023-07-20 (×2): qty 1

## 2023-07-20 MED ORDER — INSULIN ASPART 100 UNIT/ML IJ SOLN: 0.0000 [IU] | Freq: Every day | INTRAMUSCULAR | Status: DC

## 2023-07-20 MED ORDER — BUPIVACAINE HCL (PF) 0.5 % IJ SOLN
INTRAMUSCULAR | Status: AC
  Filled 2023-07-20: qty 10

## 2023-07-20 MED ORDER — INSULIN ASPART 100 UNIT/ML IJ SOLN
5.0000 [IU] | Freq: Once | INTRAMUSCULAR | Status: AC
  Administered 2023-07-20: 5 [IU] via SUBCUTANEOUS

## 2023-07-20 MED ORDER — CELECOXIB 200 MG PO CAPS
200.0000 mg | ORAL_CAPSULE | Freq: Two times a day (BID) | ORAL | Status: DC
  Administered 2023-07-20 – 2023-07-21 (×2): 200 mg via ORAL
  Filled 2023-07-20 (×2): qty 1

## 2023-07-20 MED ORDER — LEVOTHYROXINE SODIUM 88 MCG PO TABS
88.0000 ug | ORAL_TABLET | Freq: Every day | ORAL | Status: DC
  Administered 2023-07-21: 88 ug via ORAL
  Filled 2023-07-20: qty 1

## 2023-07-20 MED ORDER — TRANEXAMIC ACID-NACL 1000-0.7 MG/100ML-% IV SOLN
1000.0000 mg | Freq: Once | INTRAVENOUS | Status: AC
  Administered 2023-07-20: 1000 mg via INTRAVENOUS

## 2023-07-20 MED ORDER — METOCLOPRAMIDE HCL 10 MG PO TABS
10.0000 mg | ORAL_TABLET | Freq: Three times a day (TID) | ORAL | Status: DC
  Administered 2023-07-20 – 2023-07-21 (×4): 10 mg via ORAL
  Filled 2023-07-20 (×4): qty 1

## 2023-07-20 MED ORDER — BISACODYL 10 MG RE SUPP: 10.0000 mg | Freq: Every day | RECTAL | Status: DC | PRN

## 2023-07-20 MED ORDER — CELECOXIB 200 MG PO CAPS
ORAL_CAPSULE | ORAL | Status: AC
  Filled 2023-07-20: qty 2

## 2023-07-20 MED ORDER — OXYCODONE HCL 5 MG PO TABS
5.0000 mg | ORAL_TABLET | Freq: Once | ORAL | Status: AC | PRN
  Administered 2023-07-20: 5 mg via ORAL

## 2023-07-20 MED ORDER — TRANEXAMIC ACID-NACL 1000-0.7 MG/100ML-% IV SOLN
INTRAVENOUS | Status: AC
  Filled 2023-07-20: qty 100

## 2023-07-20 MED ORDER — INSULIN ASPART 100 UNIT/ML IJ SOLN
6.0000 [IU] | Freq: Three times a day (TID) | INTRAMUSCULAR | Status: DC
  Administered 2023-07-20 – 2023-07-21 (×3): 6 [IU] via SUBCUTANEOUS
  Filled 2023-07-20 (×3): qty 1

## 2023-07-20 MED ORDER — PANTOPRAZOLE SODIUM 40 MG PO TBEC
40.0000 mg | DELAYED_RELEASE_TABLET | Freq: Two times a day (BID) | ORAL | Status: DC
  Administered 2023-07-20 – 2023-07-21 (×2): 40 mg via ORAL
  Filled 2023-07-20 (×2): qty 1

## 2023-07-20 MED ORDER — INSULIN ASPART 100 UNIT/ML IJ SOLN
INTRAMUSCULAR | Status: AC
  Filled 2023-07-20: qty 1

## 2023-07-20 MED ORDER — FLEET ENEMA RE ENEM: 1.0000 | ENEMA | Freq: Once | RECTAL | Status: DC | PRN

## 2023-07-20 MED ORDER — FENTANYL CITRATE (PF) 100 MCG/2ML IJ SOLN
INTRAMUSCULAR | Status: DC | PRN
  Administered 2023-07-20: 50 ug via INTRAVENOUS

## 2023-07-20 MED ORDER — FLUTICASONE PROPIONATE 50 MCG/ACT NA SUSP
2.0000 | Freq: Every day | NASAL | Status: DC
  Administered 2023-07-20: 2 via NASAL
  Filled 2023-07-20 (×2): qty 16

## 2023-07-20 MED ORDER — TRAMADOL HCL 50 MG PO TABS
50.0000 mg | ORAL_TABLET | ORAL | Status: DC | PRN
  Administered 2023-07-21: 50 mg via ORAL
  Filled 2023-07-20: qty 1

## 2023-07-20 MED ORDER — OXYCODONE HCL 5 MG PO TABS
10.0000 mg | ORAL_TABLET | ORAL | Status: DC | PRN
  Administered 2023-07-21: 10 mg via ORAL
  Filled 2023-07-20: qty 2

## 2023-07-20 MED ORDER — OXYCODONE HCL 5 MG PO TABS
5.0000 mg | ORAL_TABLET | ORAL | Status: DC | PRN
  Administered 2023-07-20 (×3): 5 mg via ORAL
  Filled 2023-07-20 (×3): qty 1

## 2023-07-20 MED ORDER — CEFAZOLIN SODIUM-DEXTROSE 2-4 GM/100ML-% IV SOLN
2.0000 g | INTRAVENOUS | Status: AC
  Administered 2023-07-20: 2 g via INTRAVENOUS

## 2023-07-20 MED ORDER — CELECOXIB 200 MG PO CAPS
400.0000 mg | ORAL_CAPSULE | Freq: Once | ORAL | Status: AC
  Administered 2023-07-20: 400 mg via ORAL

## 2023-07-20 MED ORDER — INSULIN ASPART 100 UNIT/ML IJ SOLN
0.0000 [IU] | INTRAMUSCULAR | Status: DC
  Administered 2023-07-20: 11 [IU] via SUBCUTANEOUS
  Administered 2023-07-20: 15 [IU] via SUBCUTANEOUS
  Administered 2023-07-21: 2 [IU] via SUBCUTANEOUS
  Administered 2023-07-21 (×2): 5 [IU] via SUBCUTANEOUS
  Filled 2023-07-20 (×5): qty 1

## 2023-07-20 MED ORDER — BUPIVACAINE HCL (PF) 0.5 % IJ SOLN
INTRAMUSCULAR | Status: DC | PRN
  Administered 2023-07-20: 3 mL

## 2023-07-20 MED ORDER — INSULIN ASPART 100 UNIT/ML IJ SOLN
0.0000 [IU] | Freq: Three times a day (TID) | INTRAMUSCULAR | Status: DC
  Administered 2023-07-20: 11 [IU] via SUBCUTANEOUS

## 2023-07-20 MED ORDER — SODIUM CHLORIDE 0.9 % IV SOLN
INTRAVENOUS | Status: DC | PRN
  Administered 2023-07-20: 60 mL

## 2023-07-20 MED ORDER — SURGIPHOR WOUND IRRIGATION SYSTEM - OPTIME
TOPICAL | Status: DC | PRN
  Administered 2023-07-20: 450 mL

## 2023-07-20 MED ORDER — CHLORHEXIDINE GLUCONATE 0.12 % MT SOLN
15.0000 mL | Freq: Once | OROMUCOSAL | Status: AC
  Administered 2023-07-20: 15 mL via OROMUCOSAL

## 2023-07-20 MED ORDER — TRANEXAMIC ACID-NACL 1000-0.7 MG/100ML-% IV SOLN: 1000.0000 mg | INTRAVENOUS | Status: DC

## 2023-07-20 MED ORDER — DEXAMETHASONE SODIUM PHOSPHATE 10 MG/ML IJ SOLN
8.0000 mg | Freq: Once | INTRAMUSCULAR | Status: AC
  Administered 2023-07-20: 8 mg via INTRAVENOUS

## 2023-07-20 MED ORDER — HYDROMORPHONE HCL 1 MG/ML IJ SOLN: 0.5000 mg | INTRAMUSCULAR | Status: DC | PRN

## 2023-07-20 MED ORDER — METFORMIN HCL ER 500 MG PO TB24
1000.0000 mg | ORAL_TABLET | Freq: Every evening | ORAL | Status: DC
  Administered 2023-07-20: 1000 mg via ORAL

## 2023-07-20 MED ORDER — PROPOFOL 500 MG/50ML IV EMUL
INTRAVENOUS | Status: DC | PRN
  Administered 2023-07-20: 115 ug/kg/min via INTRAVENOUS
  Administered 2023-07-20 (×2): 100 ug/kg/min via INTRAVENOUS
  Administered 2023-07-20: 40 mg via INTRAVENOUS

## 2023-07-20 MED ORDER — GABAPENTIN 300 MG PO CAPS
ORAL_CAPSULE | ORAL | Status: AC
  Filled 2023-07-20: qty 1

## 2023-07-20 MED ORDER — MIDAZOLAM HCL 5 MG/5ML IJ SOLN
INTRAMUSCULAR | Status: DC | PRN
  Administered 2023-07-20: 2 mg via INTRAVENOUS

## 2023-07-20 MED ORDER — TRANEXAMIC ACID-NACL 1000-0.7 MG/100ML-% IV SOLN
INTRAVENOUS | Status: DC | PRN
  Administered 2023-07-20: 1000 mg via INTRAVENOUS

## 2023-07-20 MED ORDER — AZELASTINE HCL 0.1 % NA SOLN
2.0000 | Freq: Every day | NASAL | Status: DC
  Administered 2023-07-20: 2 via NASAL
  Filled 2023-07-20 (×2): qty 30

## 2023-07-20 SURGICAL SUPPLY — 63 items
ATTUNE PSFEM LTSZ5 NARCEM KNEE (Femur) IMPLANT
ATTUNE PSRP INSR SZ5 5 KNEE (Insert) IMPLANT
BASE TIBIAL ROT PLAT SZ 5 KNEE (Knees) IMPLANT
BATTERY INSTRU NAVIGATION (MISCELLANEOUS) ×4 IMPLANT
BIT DRILL QUICK REL 1/8 2PK SL (BIT) ×1 IMPLANT
BLADE CLIPPER SURG (BLADE) IMPLANT
BLADE SAW 70X12.5 (BLADE) ×1 IMPLANT
BLADE SAW 90X13X1.19 OSCILLAT (BLADE) ×1 IMPLANT
BLADE SAW 90X25X1.19 OSCILLAT (BLADE) ×1 IMPLANT
BRUSH SCRUB EZ PLAIN DRY (MISCELLANEOUS) ×1 IMPLANT
CEMENT BONE GENTAMICIN (Cement) ×2 IMPLANT
CEMENT BONE GENTAMICIN 40 (Cement) IMPLANT
COOLER POLAR GLACIER W/PUMP (MISCELLANEOUS) ×1 IMPLANT
CUFF TRNQT CYL 24X4X16.5-23 (TOURNIQUET CUFF) IMPLANT
CUFF TRNQT CYL 30X4X21-28X (TOURNIQUET CUFF) IMPLANT
DRAPE SHEET LG 3/4 BI-LAMINATE (DRAPES) ×1 IMPLANT
DRSG AQUACEL AG ADV 3.5X14 (GAUZE/BANDAGES/DRESSINGS) ×1 IMPLANT
DRSG MEPILEX SACRM 8.7X9.8 (GAUZE/BANDAGES/DRESSINGS) ×1 IMPLANT
DRSG TEGADERM 4X4.75 (GAUZE/BANDAGES/DRESSINGS) ×1 IMPLANT
DURAPREP 26ML APPLICATOR (WOUND CARE) ×2 IMPLANT
ELECT CAUTERY BLADE 6.4 (BLADE) ×1 IMPLANT
ELECTRODE REM PT RTRN 9FT ADLT (ELECTROSURGICAL) ×1 IMPLANT
EVACUATOR 1/8 PVC DRAIN (DRAIN) ×1 IMPLANT
EX-PIN ORTHOLOCK NAV 4X150 (PIN) ×2 IMPLANT
GAUZE XEROFORM 1X8 LF (GAUZE/BANDAGES/DRESSINGS) ×1 IMPLANT
GLOVE BIO SURGEON STRL SZ7.5 (GLOVE) ×6 IMPLANT
GLOVE BIOGEL PI IND STRL 8 (GLOVE) ×2 IMPLANT
GOWN STRL REUS W/ TWL LRG LVL3 (GOWN DISPOSABLE) ×1 IMPLANT
GOWN STRL REUS W/ TWL XL LVL3 (GOWN DISPOSABLE) ×1 IMPLANT
GOWN TOGA ZIPPER T7+ PEEL AWAY (MISCELLANEOUS) ×1 IMPLANT
HOLDER FOLEY CATH W/STRAP (MISCELLANEOUS) ×1 IMPLANT
HOOD PEEL AWAY T7 (MISCELLANEOUS) ×1 IMPLANT
KIT TURNOVER KIT A (KITS) ×1 IMPLANT
KNIFE SCULPS 14X20 (INSTRUMENTS) ×1 IMPLANT
MANIFOLD NEPTUNE II (INSTRUMENTS) ×2 IMPLANT
NDL SPNL 20GX3.5 QUINCKE YW (NEEDLE) ×2 IMPLANT
NEEDLE SPNL 20GX3.5 QUINCKE YW (NEEDLE) ×2 IMPLANT
PACK TOTAL KNEE (MISCELLANEOUS) ×1 IMPLANT
PAD ABD DERMACEA PRESS 5X9 (GAUZE/BANDAGES/DRESSINGS) ×2 IMPLANT
PAD ARMBOARD POSITIONER FOAM (MISCELLANEOUS) ×3 IMPLANT
PAD WRAPON POLAR KNEE (MISCELLANEOUS) ×1 IMPLANT
PATELLA MEDIAL ATTUN 35MM KNEE (Knees) IMPLANT
PENCIL SMOKE EVACUATOR COATED (MISCELLANEOUS) ×1 IMPLANT
PIN DRILL FIX HALF THREAD (BIT) ×2 IMPLANT
PIN FIXATION 1/8DIA X 3INL (PIN) ×1 IMPLANT
SOL .9 NS 3000ML IRR UROMATIC (IV SOLUTION) ×1 IMPLANT
SOLUTION IRRIG SURGIPHOR (IV SOLUTION) ×1 IMPLANT
SPONGE DRAIN TRACH 4X4 STRL 2S (GAUZE/BANDAGES/DRESSINGS) ×1 IMPLANT
STAPLER SKIN PROX 35W (STAPLE) ×1 IMPLANT
STOCKINETTE IMPERV 14X48 (MISCELLANEOUS) ×1 IMPLANT
STOCKINETTE STRL BIAS CUT 8X4 (MISCELLANEOUS) ×1 IMPLANT
STRAP TIBIA SHORT (MISCELLANEOUS) ×1 IMPLANT
SUCTION TUBE FRAZIER 10FR DISP (SUCTIONS) ×1 IMPLANT
SUT VIC AB 0 CT1 36 (SUTURE) ×1 IMPLANT
SUT VIC AB 1 CT1 36 (SUTURE) ×2 IMPLANT
SUT VIC AB 2-0 CT2 27 (SUTURE) ×1 IMPLANT
SYR 30ML LL (SYRINGE) ×2 IMPLANT
TIP FAN IRRIG PULSAVAC PLUS (DISPOSABLE) ×1 IMPLANT
TOWEL OR 17X26 4PK STRL BLUE (TOWEL DISPOSABLE) ×1 IMPLANT
TOWER CARTRIDGE SMART MIX (DISPOSABLE) ×1 IMPLANT
TRAP FLUID SMOKE EVACUATOR (MISCELLANEOUS) ×1 IMPLANT
TRAY FOLEY MTR SLVR 16FR STAT (SET/KITS/TRAYS/PACK) ×1 IMPLANT
WATER STERILE IRR 1000ML POUR (IV SOLUTION) ×1 IMPLANT

## 2023-07-20 NOTE — Progress Notes (Signed)
 Patient changed her V-Go30 at 2030 as discussed earlier in day with diabetes coordinator.

## 2023-07-20 NOTE — Inpatient Diabetes Management (Signed)
 Inpatient Diabetes Program Recommendations  AACE/ADA: New Consensus Statement on Inpatient Glycemic Control   Target Ranges:  Prepandial:   less than 140 mg/dL      Peak postprandial:   less than 180 mg/dL (1-2 hours)      Critically ill patients:  140 - 180 mg/dL    Latest Reference Range & Units 07/20/23 06:19 07/20/23 11:18 07/20/23 12:26  Glucose-Capillary 70 - 99 mg/dL 161 (H) 096 (H) 045 (H)    Latest Reference Range & Units 07/14/23 11:54  Hemoglobin A1C 4.8 - 5.6 % 6.7 (H)   Review of Glycemic Control  Diabetes history: DM2 Outpatient Diabetes medications: V-Go30 with Novolog  (provides 30 units for basal insulin  over 24 hours; takes 7 clicks with breakfast, 6 clicks with lunch, and 7 clicks with supper (each click is 2 units), Ozempic  1 mg Qweek, Metformin  XR 1000 mg QAM, also uses Humalog  pen when needed for hyperglycemia Current orders for Inpatient glycemic control: Novolog  0-15 units Q4H, Novolog  6 units TID with meals (for meal coverage if pt eats 50% of meals), and Insulin  Pump V-Go30  Inpatient DM Recommendations:  Pump and SQ insulin  regimen: Patient has on disposable V-Go30 insulin  pump with Novolog  insulin . The V-Go30 delivers 30 units of insulin  for basal needs over 24 hours.  Patient will need to change out pump this evening before bed since the current V-Go30 runs out this evening. Patient reports she has all needed V-Go30 supplies at bedside. Patient is NOT to give herself any extra clicks on the pump while inpatient.  Nursing will use Novolog  correction scale Q4H for correction based on finger stick glucose and Novolog  6 units TID with meals for meal coverage.  NOTE: Received page regarding patient with insulin  pump in PACU. Patient had knee surgery today and received Decadron  8 mg at 6:30 am today. Patient has DM2 and uses a V-Go30 insulin  pump along with Ozempic  and Metformin  outpatient. Patient recently started seeing Dr. Shelvy Dickens (Endocrinologist) and was seen on  06/30/23 for initial visit. Spoke with patient in PACU regarding DM control and insulin  pump. Patient uses V-Go30 insulin  pump with Novolog  insulin  and she takes 6-7 clicks with meals; also takes Ozempic  and Metformin  as noted above. Patient is using Dexcom G7 CGM sensor and her phone app to read the sensor. Patient reports that her glucose goes low during the night at times and sometimes her glucose is high. In talking with patient, it sounds like she is giving herself multiple clicks (or takes Humalog  injections) within short time spans for hyperglycemia and then she ends up going low. Discussed Novolog  insulin  and explained that it works for 4 hours. Encouraged patient not to give Novolog  or Humalog  doses closer than 4 hours to prevent insulin  stacking and hypoglycemia. Patient currently has V-Go30 pump on right lower abdomen and her Dexcom G7 sensor on lower left abdomen. Patient reports that she does finger sticks multiple times a day and that her finger sticks are way off from CGM sensor reading (sometimes as much as 200 mg/dl). Discussed that the Dexcom G7 should be placed on back of upper arms and they may improve correlation of CGM glucose and finger stick glucose. Patient reports she still has a lot of questions about DM and what to do at home in different situations. Addressed patient's questions and encouraged patient to do outpatient DM education as well. Patient states she would like to do outpatient DM education. Discussed impact of diet, exercise, stress, and steroids on glucose. Informed patient  she received Decadron  which is a steroid and will be contributing to hyperglycemia. Patient states that her current V-Go30 pump was put on last night and she has supplies to put a new one on this evening before bed (each V-Go expires in 24 hours; so it has to be changed out every 24 hours). Informed patient that we don't want her to do any clicks on her pump, just allow it to give hourly basal rate (total of  30 units over 24 hours) and that Novolog  correction would be ordered for Q4H and Novolog  6 units TID with meals for meal coverage if she eats at least 50% of meals. Patient is agreeable to plan; also discussed plan with Bernardo Bridgeman, RN at bedside.  Sent chat message to Dr. Aubry Blase regarding conversation with patient and recommendations.  Thanks, Beacher Limerick, RN, MSN, CDC[facility] Diabetes Coordinator Inpatient Diabetes Program [phone] (Team Pager from 8am to 5pm)

## 2023-07-20 NOTE — Progress Notes (Signed)
 Dr. Lydia Sams on call:  made aware of blood glucose fingerstick 412:  serum blood drawn, administered 15units of insulin .  Will wait for lab result and administer any further insulin .  Patient and family member verbalize understanding.

## 2023-07-20 NOTE — Op Note (Signed)
 OPERATIVE NOTE  DATE OF SURGERY:  07/20/2023  PATIENT NAME:  Molly Wall   DOB: [birth year]  MRN: [id number]  PRE-OPERATIVE DIAGNOSIS: Degenerative arthrosis of the left knee, primary  POST-OPERATIVE DIAGNOSIS:  Same  PROCEDURE:  Left total knee arthroplasty using computer-assisted navigation  SURGEON:  Maxene Span. M.D.  ASSISTANT:  Benjiman Bras, PA-C (present and scrubbed throughout the case, critical for assistance with exposure, retraction, instrumentation, and closure)  ANESTHESIA: spinal  ESTIMATED BLOOD LOSS: 50 mL  FLUIDS REPLACED: 600 mL of crystalloid  TOURNIQUET TIME: 86 minutes  DRAINS: 2 medium Hemovac drains  SOFT TISSUE RELEASES: Anterior cruciate ligament, posterior cruciate ligament, deep medial collateral ligament, patellofemoral ligament  IMPLANTS UTILIZED: DePuy Attune size 5N posterior stabilized femoral component (cemented), size 5 rotating platform tibial component (cemented), 35 mm medialized dome patella (cemented), and a 5 mm stabilized rotating platform polyethylene insert.  INDICATIONS FOR SURGERY: Molly Wall is a 78 y.o. year old female with a long history of progressive knee pain. X-rays demonstrated severe degenerative changes in tricompartmental fashion. The patient had not seen any significant improvement despite conservative nonsurgical intervention. After discussion of the risks and benefits of surgical intervention, the patient expressed understanding of the risks benefits and agree with plans for total knee arthroplasty.   The risks, benefits, and alternatives were discussed at length including but not limited to the risks of infection, bleeding, nerve injury, stiffness, blood clots, the need for revision surgery, cardiopulmonary complications, among others, and they were willing to proceed.  PROCEDURE IN DETAIL: The patient was brought into the operating room and, after adequate spinal anesthesia was achieved,  a tourniquet was placed on the patient's upper thigh. The patient's knee and leg were cleaned and prepped with alcohol and DuraPrep and draped in the usual sterile fashion. A "timeout" was performed as per usual protocol. The lower extremity was exsanguinated using an Esmarch, and the tourniquet was inflated to 300 mmHg. An anterior longitudinal incision was made followed by a standard mid vastus approach. The deep fibers of the medial collateral ligament were elevated in a subperiosteal fashion off of the medial flare of the tibia so as to maintain a continuous soft tissue sleeve. The patella was subluxed laterally and the patellofemoral ligament was incised. Inspection of the knee demonstrated severe degenerative changes with full-thickness loss of articular cartilage. Osteophytes were debrided using a rongeur. Anterior and posterior cruciate ligaments were excised. Two 4.0 mm Schanz pins were inserted in the femur and into the tibia for attachment of the array of trackers used for computer-assisted navigation. Hip center was identified using a circumduction technique. Distal landmarks were mapped using the computer. The distal femur and proximal tibia were mapped using the computer. The distal femoral cutting guide was positioned using computer-assisted navigation so as to achieve a 5 distal valgus cut. The femur was sized and it was felt that a size 5N femoral component was appropriate. A size 5 femoral cutting guide was positioned and the anterior cut was performed and verified using the computer. This was followed by completion of the posterior and chamfer cuts. Femoral cutting guide for the central box was then positioned in the center box cut was performed.  Attention was then directed to the proximal tibia. Medial and lateral menisci were excised. The extramedullary tibial cutting guide was positioned using computer-assisted navigation so as to achieve a 0 varus-valgus alignment and 3 posterior slope.  The cut was performed and verified using the computer. The  proximal tibia was sized and it was felt that a size 5 tibial tray was appropriate. Tibial and femoral trials were inserted followed by insertion of a 5 mm polyethylene insert. This allowed for excellent mediolateral soft tissue balancing both in flexion and in full extension. Finally, the patella was cut and prepared so as to accommodate a 35 mm medialized dome patella. A patella trial was placed and the knee was placed through a range of motion with excellent patellar tracking appreciated. The femoral trial was removed after debridement of posterior osteophytes. The central post-hole for the tibial component was reamed followed by insertion of a keel punch. Tibial trials were then removed. Cut surfaces of bone were irrigated with copious amounts of normal saline using pulsatile lavage and then suctioned dry. Polymethylmethacrylate cement with gentamicin was prepared in the usual fashion using a vacuum mixer. Cement was applied to the cut surface of the proximal tibia as well as along the undersurface of a size 5 rotating platform tibial component. Tibial component was positioned and impacted into place. Excess cement was removed using Personal assistant. Cement was then applied to the cut surfaces of the femur as well as along the posterior flanges of the size 5N femoral component. The femoral component was positioned and impacted into place. Excess cement was removed using Personal assistant. A 5 mm polyethylene trial was inserted and the knee was brought into full extension with steady axial compression applied. Finally, cement was applied to the backside of a 35 mm medialized dome patella and the patellar component was positioned and patellar clamp applied. Excess cement was removed using Personal assistant. After adequate curing of the cement, the tourniquet was deflated after a total tourniquet time of 86 minutes. Hemostasis was achieved using electrocautery.  The knee was irrigated with copious amounts of normal saline using pulsatile lavage followed by 450 ml of Surgiphor and then suctioned dry. 20 mL of 1.3% Exparel  and 60 mL of 0.25% Marcaine  in 40 mL of normal saline was injected along the posterior capsule, medial and lateral gutters, and along the arthrotomy site. A 5 mm stabilized rotating platform polyethylene insert was inserted and the knee was placed through a range of motion with excellent mediolateral soft tissue balancing appreciated and excellent patellar tracking noted. 2 medium drains were placed in the wound bed and brought out through separate stab incisions. The medial parapatellar portion of the incision was reapproximated using interrupted sutures of #1 Vicryl. Subcutaneous tissue was approximated in layers using first #0 Vicryl followed #2-0 Vicryl. The skin was approximated with skin staples. A sterile dressing was applied.  The patient tolerated the procedure well and was transported to the recovery room in stable condition.    Helmi Hechavarria P. Chayse Gracey, Jr., M.D.

## 2023-07-20 NOTE — Plan of Care (Signed)

## 2023-07-20 NOTE — Plan of Care (Signed)
 Verbalizes understanding of plan of care, procedure, discharge instructions

## 2023-07-20 NOTE — TOC Initial Note (Signed)
 Transition of Care [facility]) - Initial/Assessment Note    Patient Details  Name: Molly Wall MRN: [id number] Date of Birth: [birth year]  Transition of [facility]) CM/SW Contact:    Alexandra Ice, RN Phone Number: 07/20/2023, 3:43 PM  Clinical Narrative:                  Patient lives alone in lower level of home and daughter lives above her. She is independent with ADLs. Has PCP.  [facility] set up by surgeon's office prior to surgery. She needs RW and BSC, sent to Jon with [employer] for processing.        Patient Goals and CMS Choice            Expected Discharge Plan and Services                                              Prior Living Arrangements/Services                       Activities of Daily Living   ADL Screening (condition at time of admission) Independently performs ADLs?: Yes (appropriate for developmental age) Is the patient deaf or have difficulty hearing?: No Does the patient have difficulty seeing, even when wearing glasses/contacts?: No (just for reading) Does the patient have difficulty concentrating, remembering, or making decisions?: No  Permission Sought/Granted                  Emotional Assessment              Admission diagnosis:  Primary osteoarthritis of left knee [M17.12] History of total knee arthroplasty, left [Z96.652] Patient Active Problem List   Diagnosis Date Noted   History of total knee arthroplasty, left 07/20/2023   OSA (obstructive sleep apnea) 06/21/2023   Primary osteoarthritis of both knees 06/21/2023   Epigastric discomfort 04/20/2023   Productive cough 02/11/2023   Chronic sinusitis 02/11/2023   Acute bronchitis 02/11/2023   Dizziness 11/20/2022   Neuropathic pain of both feet 10/31/2021   GERD (gastroesophageal reflux disease) 04/25/2021   Word finding difficulty 02/21/2020   Arthralgia of both hands 11/29/2019   Seasonal allergic rhinitis 11/29/2019   Chronic knee  pain (intermittent) (Right) 11/01/2018   Chronic pain syndrome 10/31/2018   History iodine allergy 09/28/2018    Class: History of   DDD (degenerative disc disease), lumbar 09/27/2018   Grade 1 Anterolisthesis of L5/S1 09/16/2018   Lumbar facet arthropathy 09/16/2018   Arthropathy of lumbosacral facet joint 09/16/2018   Sialadenitis 08/24/2018   Atypical lobular hyperplasia [facility]) of right breast 11/06/2017   Family history of breast cancer    Family history of colon cancer    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 [place] [address] Phone: [phone] Fax: [phone]  [facility] [place] (New Address) - [place], [place] - [address] AT Previously: [address], [place] [address] 4th Floor [address] [place] [address] Phone: [phone] Fax: [phone]     Social Drivers of Health (SDOH) Social History: SDOH Screenings   Food Insecurity: No Food Insecurity (07/20/2023)  Housing: Low Risk  (07/20/2023)  Transportation Needs: No Transportation Needs (07/20/2023)  Utilities: Not At Risk (07/20/2023)  Depression (PHQ2-9): Low Risk  (04/20/2023)  Financial Resource Strain: Low Risk  (06/30/2023)   Received from [facility]  Physical  Activity: Inactive (10/23/2022)  Social Connections: Moderately Integrated (07/20/2023)  Stress: No Stress Concern Present (10/23/2022)  Tobacco Use: Low Risk  (07/20/2023)  Recent Concern: Tobacco Use - Medium Risk (07/02/2023)   Received from [facility]  Health Literacy: Adequate Health Literacy (10/23/2022)   SDOH Interventions:     Readmission Risk Interventions     No data to display

## 2023-07-20 NOTE — Anesthesia Preprocedure Evaluation (Signed)
 Anesthesia Evaluation  Patient identified by MRN, date of birth, ID band Patient awake    Reviewed: Allergy & Precautions, NPO status , Patient's Chart, lab work & pertinent test results  History of Anesthesia Complications Negative for: history of anesthetic complications  Airway Mallampati: III  TM Distance: >3 FB Neck ROM: full    Dental no notable dental hx.    Pulmonary asthma , sleep apnea and Continuous Positive Airway Pressure Ventilation    Pulmonary exam normal        Cardiovascular negative cardio ROS Normal cardiovascular exam     Neuro/Psych  Neuromuscular disease  negative psych ROS   GI/Hepatic Neg liver ROS,GERD  Medicated,,  Endo/Other  diabetes, Type 1, Insulin  DependentHypothyroidism  Class 3 obesity  Renal/GU      Musculoskeletal   Abdominal   Peds  Hematology negative hematology ROS (+)   Anesthesia Other Findings Past Medical History: 11/20/2021: Adventitious breath sounds No date: Allergic rhinitis No date: Allergy No date: Arthritis     Comment:  bilateral knees No date: Asthma 08/12/2017: Atypical ductal hyperplasia of right breast No date: Breast cancer (HCC) No date: Diabetes mellitus without complication (HCC) No date: Family history of breast cancer No date: Family history of colon cancer No date: Hypercholesteremia No date: Hypothyroidism No date: Lumbar herniated disc     Comment:  4-5th lumbar No date: OSA (obstructive sleep apnea)     Comment:  used to use CPAP, not any more over 1 1/2 yrs No date: Osteoarthritis No date: Salivary stone     Comment:  left side No date: Sleep apnea     Comment:  no cpap in 2 yrs  No date: Vitamin D  deficiency  Past Surgical History: No date: APPENDECTOMY 07/10/2023: BREAST BIOPSY; Left     Comment:  MM LT BREAST BX W LOC DEV 1ST LESION IMAGE BX SPEC               STEREO GUIDE 07/10/2023 [facility] No date: BREAST LUMPECTOMY;  Right     Comment:  per pt - precancerous 08/12/2017: BREAST LUMPECTOMY WITH RADIOACTIVE SEED LOCALIZATION;  Right     Comment:  Procedure: RIGHT BREAST LUMPECTOMY X'S 2 WITH               RADIOACTIVE SEED LOCALIZATION X'S 2;  Surgeon: Boyce Byes, MD;  Location:  SURGERY CENTER;                Service: General;  Laterality: Right; No date: COLONOSCOPY     [place] but given a 10 yr               recall per pt  No date: GANGLION CYST EXCISION     Comment:  78 years old No date: INCISION AND DRAINAGE ABSCESS ANAL 2014: KNEE SURGERY 1976: SHOULDER SURGERY No date: TONSILLECTOMY     Comment:  age 72  BMI    Body Mass Index: 40.10 kg/m      Reproductive/Obstetrics negative OB ROS                             Anesthesia Physical Anesthesia Plan  ASA: 3  Anesthesia Plan: Spinal   Post-op Pain Management: Regional block*, Gabapentin  PO (pre-op)*, Celebrex  PO (pre-op)* and Ofirmev  IV (intra-op)*   Induction: Intravenous  PONV Risk Score and Plan: 2 and Propofol  infusion, TIVA and Treatment may vary due to age or medical condition  Airway Management Planned: Natural Airway and Nasal Cannula  Additional Equipment:   Intra-op Plan:   Post-operative Plan:   Informed Consent: I have reviewed the patients History and Physical, chart, labs and discussed the procedure including the risks, benefits and alternatives for the proposed anesthesia with the patient or authorized representative who has indicated his/her understanding and acceptance.     Dental Advisory Given  Plan Discussed with: Anesthesiologist, CRNA and Surgeon  Anesthesia Plan Comments: (Patient reports no bleeding problems and no anticoagulant use.  Plan for spinal with backup GA  Patient consented for risks of anesthesia including but not limited to:  - adverse reactions to medications - damage to eyes, teeth, lips or  other oral mucosa - nerve damage due to positioning  - risk of bleeding, infection and or nerve damage from spinal that could lead to paralysis - risk of headache or failed spinal - damage to teeth, lips or other oral mucosa - sore throat or hoarseness - damage to heart, brain, nerves, lungs, other parts of body or loss of life  Patient voiced understanding and assent.)       Anesthesia Quick Evaluation

## 2023-07-20 NOTE — Progress Notes (Signed)
 Dr. Joline Ned: anesthesia made aware of blood sugar 279 @ 11:19am, no coverage ordered at this time. There is a consult placed for diabetes coordinator, anesthesia would like coordinator to handle blood sugar coverage. Patient states dexcom delivers dose of insulin  at breakfast/lunch/dinner.

## 2023-07-20 NOTE — Progress Notes (Signed)
 Patient is not able to walk the distance required to go the bathroom, or he/she is unable to safely negotiate stairs required to access the bathroom.  A 3in1 BSC will alleviate this problem

## 2023-07-20 NOTE — Transfer of Care (Signed)
 Immediate Anesthesia Transfer of Care Note  Patient: Molly Wall  Procedure(s) Performed: ARTHROPLASTY, KNEE, TOTAL, USING IMAGELESS COMPUTER-ASSISTED NAVIGATION (Left: Knee)  Patient Location: PACU  Anesthesia Type:Spinal  Level of Consciousness: drowsy  Airway & Oxygen Therapy: Patient Spontanous Breathing and Patient connected to face mask oxygen  Post-op Assessment: Report given to RN and Post -op Vital signs reviewed and stable  Post vital signs: Reviewed and stable  Last Vitals:  Vitals Value Taken Time  BP 115/61 07/20/23 1117  Temp    Pulse 79 07/20/23 1120  Resp 15 07/20/23 1120  SpO2 99 % 07/20/23 1120  Vitals shown include unfiled device data.  Last Pain:  Vitals:   07/20/23 0623  TempSrc: Temporal  PainSc: 0-No pain         Complications: There were no known notable events for this encounter.

## 2023-07-20 NOTE — Anesthesia Procedure Notes (Addendum)
 Spinal  Patient location during procedure: OR Start time: 07/20/2023 7:34 AM End time: 07/20/2023 7:39 AM Reason for block: surgical anesthesia Staffing Performed: resident/CRNA  Resident/CRNA: Simona Dublin, CRNA Performed by: Simona Dublin, CRNA Authorized by: Nancey Awkward, MD   Preanesthetic Checklist Completed: patient identified, IV checked, site marked, risks and benefits discussed, surgical consent, monitors and equipment checked, pre-op evaluation and timeout performed Spinal Block Patient position: sitting Prep: DuraPrep Patient monitoring: heart rate, cardiac monitor, continuous pulse ox and blood pressure Approach: midline Location: L3-4 Injection technique: single-shot Needle Needle type: Pencan  Needle gauge: 24 G Needle length: 9 cm Assessment Sensory level: T10 Events: CSF return Additional Notes Meticulous sterile technique used throughout (CHG prep, sterile gloves, sterile drape). Negative paresthesia. Negative blood return. Positive free-flowing CSF. Expiration date of kit checked and confirmed. Patient tolerated procedure well, without complications.

## 2023-07-20 NOTE — Progress Notes (Signed)
 Subjective: 1 Day Post-Op Procedure(s) (LRB): ARTHROPLASTY, KNEE, TOTAL, USING IMAGELESS COMPUTER-ASSISTED NAVIGATION (Left) Patient reports pain as mild.   Patient seen in rounds with Dr. Aubry Blase. Patient is well, and has had no acute complaints or problems. Denies any CP, SOB, N/V, fevers or chills We will start therapy today.  Plan is to go Home after hospital stay.  Objective: Vital signs in last 24 hours: Temp:  [97 F (36.1 C)-99 F (37.2 C)] 98.2 F (36.8 C) (05/06 0756) Pulse Rate:  [69-93] 70 (05/06 0756) Resp:  [11-18] 15 (05/06 0756) BP: (106-156)/(50-87) 138/69 (05/06 0756) SpO2:  [93 %-99 %] 99 % (05/06 0756)  Intake/Output from previous day:  Intake/Output Summary (Last 24 hours) at 07/21/2023 0836 Last data filed at 07/21/2023 0412 Gross per 24 hour  Intake 1840.18 ml  Output 360 ml  Net 1480.18 ml    Intake/Output this shift: No intake/output data recorded.  Labs: No results for input(s): "HGB" in the last 72 hours. No results for input(s): "WBC", "RBC", "HCT", "PLT" in the last 72 hours. Recent Labs    07/20/23 2104  GLUCOSE 451*   No results for input(s): "LABPT", "INR" in the last 72 hours.  EXAM General - Patient is Alert, Appropriate, and Oriented Extremity - Neurologically intact Neurovascular intact Sensation intact distally Intact pulses distally Dorsiflexion/Plantar flexion intact No cellulitis present Compartment soft Dressing - dressing C/D/I and no drainage Motor Function - intact, moving foot and toes well on exam. JP Drain pulled without difficulty. Intact  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   Breast cancer (HCC)    Diabetes mellitus without complication (HCC)    Family history of breast cancer    Family history of colon cancer    Hypercholesteremia     Hypothyroidism    Lumbar herniated disc    4-5th lumbar   OSA (obstructive sleep apnea)    used to use CPAP, not any more over 1 1/2 yrs   Osteoarthritis    Salivary stone    left side   Sleep apnea    no cpap in 2 yrs    Vitamin D  deficiency     Assessment/Plan: 1 Day Post-Op Procedure(s) (LRB): ARTHROPLASTY, KNEE, TOTAL, USING IMAGELESS COMPUTER-ASSISTED NAVIGATION (Left) Principal Problem:   History of total knee arthroplasty, left  Estimated body mass index is 40.1 kg/m as calculated from the following:   Height as of this encounter: 5\' 5"  (1.651 m).   Weight as of this encounter: 109.3 kg. Advance diet Up with therapy  Patient will continue to work with physical therapy to pass postoperative PT protocols, ROM and strengthening  Discussed with the patient continuing to utilize Polar Care  Patient will use bone foam in 20-30 minute intervals  Patient will wear TED hose bilaterally to help prevent DVT and clot formation  Discussed the Aquacel bandage.  This bandage will stay in place 7 days postoperatively.  Can be replaced with honeycomb bandages that will be sent home with the patient  Discussed sending the patient home with tramadol  and oxycodone for as needed pain management.  Patient will also be sent home with Celebrex  to help with swelling and inflammation.  Patient will take an 81 mg aspirin twice daily for DVT prophylaxis  JP drain removed without difficulty, intact  Weight-Bearing as tolerated to left leg  Patient will follow-up with [facility] orthopedics in 2 weeks for staple removal and reevaluation  Wadie Guile, PA-C [facility] 07/21/2023, 8:36 AM

## 2023-07-20 NOTE — Interval H&P Note (Signed)
 History and Physical Interval Note:  07/20/2023 6:19 AM  Molly Wall  has presented today for surgery, with the diagnosis of PRIMARY OSTEOARHTRITS OF LEFT KNEE..  The various methods of treatment have been discussed with the patient and family. After consideration of risks, benefits and other options for treatment, the patient has consented to  Procedure(s): ARTHROPLASTY, KNEE, TOTAL, USING IMAGELESS COMPUTER-ASSISTED NAVIGATION (Left) as a surgical intervention.  The patient's history has been reviewed, patient examined, no change in status, stable for surgery.  I have reviewed the patient's chart and labs.  Questions were answered to the patient's satisfaction.     Natausha Jungwirth P Shalon Councilman

## 2023-07-20 NOTE — Progress Notes (Signed)
 Patient able to move lower ext, able to wiggle toes, pulses intact. Diabetes Coordinator at bedside, ordered blood glucose checked every 4hrs, pt has dexcom, per coordinator check blood sugar per fingerstick NOT dexcom. Patient is not to "click" dexcom, nursing staff to cover blood sugar results. Patient verbalizes understanding.

## 2023-07-20 NOTE — Discharge Summary (Signed)
 Physician Discharge Summary  Subjective: 1 Day Post-Op Procedure(s) (LRB): ARTHROPLASTY, KNEE, TOTAL, USING IMAGELESS COMPUTER-ASSISTED NAVIGATION (Left) Patient reports pain as mild.   Patient seen in rounds with Dr. Aubry Blase. Patient is well, and has had no acute complaints or problems. Denies any CP, SOB, N/V, fevers or chills We will start therapy today. Patient is ready to go home  Physician Discharge Summary  Patient ID: Molly Wall MRN: [id number] DOB/AGE: 1945/11/22 78 y.o.  Admit date: 07/20/2023 Discharge date: 07/21/2023  Admission Diagnoses:  Discharge Diagnoses:  Principal Problem:   History of total knee arthroplasty, left   Discharged Condition: good  Hospital Course: Patient presented to the hospital on 07/20/2023 for an elective left total knee arthroplasty performed by Dr. Aubry Blase. Patient was given 1g of TXA and 2g of Ancef  prior to the procedure. she tolerated the procedure well without any complications. See procedural note below for details. Postoperatively, the patient did very well. she was able to pass PT protocols on post-op day one without any issues. JP drain was removed without any difficulty and was intact. she was able to void her bladder without any difficulty. Physical exam was unremarkable. she denies any SOB, CP, N/V, fevers or chills. Vital signs are stable. Patient is stable to discharge home.  PROCEDURE:  Left total knee arthroplasty using computer-assisted navigation   SURGEON:  Maxene Span. M.D.   ASSISTANT:  Benjiman Bras, PA-C (present and scrubbed throughout the case, critical for assistance with exposure, retraction, instrumentation, and closure)   ANESTHESIA: spinal   ESTIMATED BLOOD LOSS: 50 mL   FLUIDS REPLACED: 600 mL of crystalloid   TOURNIQUET TIME: 86 minutes   DRAINS: 2 medium Hemovac drains   SOFT TISSUE RELEASES: Anterior cruciate ligament, posterior cruciate ligament, deep medial collateral ligament,  patellofemoral ligament   IMPLANTS UTILIZED: DePuy Attune size 5N posterior stabilized femoral component (cemented), size 5 rotating platform tibial component (cemented), 35 mm medialized dome patella (cemented), and a 5 mm stabilized rotating platform polyethylene insert.  Treatments: none  Discharge Exam: Blood pressure 138/69, pulse 70, temperature 98.2 F (36.8 C), temperature source Oral, resp. rate 15, height 5\' 5"  (1.651 m), weight 109.3 kg, SpO2 99%.   Disposition: home  Discharge Instructions     Ambulatory referral to Nutrition and Diabetic Education   Complete by: As directed    Pt uses VGo30 insulin  pump and Dexcom G7 CGM along with Metformin  and Ozempic . Pt would like to see outpt DM educator for additional DM education as well as further discussion about possibly using the OmniPod pump. Just started seeing Dr. Shelvy Dickens 06/30/23. Had knee surgery 07/20/23.      Allergies as of 07/21/2023       Reactions   Iodine-131 Anaphylaxis   Pt states when she was 21=she drank an experimental Iodinated Radioisotope for her thyroid . She described it was a small amount in a milk carton in [place] . She states they stopped using it shortly after because the trial wasn't successful. SPM   Prednisone     Make sugar jump way up.        Medication List     STOP taking these medications    naproxen sodium 220 MG tablet Commonly known as: ALEVE       TAKE these medications    acetaminophen  650 MG CR tablet Commonly known as: TYLENOL  Take 1-2 tablets by mouth 3 (three) times daily as needed for pain.   albuterol  108 (90 Base) MCG/ACT inhaler Commonly known as:  VENTOLIN  HFA TAKE 2 PUFFS BY MOUTH EVERY 6 HOURS AS NEEDED FOR WHEEZE/SHORTNESS OF BREATH   aspirin 81 MG chewable tablet Chew 1 tablet (81 mg total) by mouth 2 (two) times daily.   atorvastatin  20 MG tablet Commonly known as: LIPITOR TAKE 1 TABLET BY MOUTH EVERY DAY FOR CHOLESTEROL   azelastine  0.1 % nasal  spray Commonly known as: ASTELIN  Place 2 sprays into both nostrils at bedtime. Use in each nostril as directed   BD Pen Needle Nano 2nd Gen 32G X 4 MM Misc Generic drug: Insulin  Pen Needle USE AS DIRECTED 3 TIMES A DAY   celecoxib  200 MG capsule Commonly known as: CELEBREX  Take 1 capsule (200 mg total) by mouth 2 (two) times daily.   Dexcom G6 Receiver Devi Use to monitor blood sugar   Dexcom G7 Sensor Misc Use to check glucose continuously,change every 10 days Dx Code: E11.65   fluticasone  50 MCG/ACT nasal spray Commonly known as: FLONASE  INSTILL 1 SPRAY IN EACH NOSTRIL TWICE A DAY AS NEEDED FOR ALLERGIES OR RHINITIS What changed: See the new instructions.   Glucagon  3 MG/DOSE Powd Place 3 mg into the nose once as needed for up to 1 dose.   insulin  lispro 100 UNIT/ML KwikPen Commonly known as: HumaLOG  KwikPen INJECT 25 UNITS INTO THE SKIN 3 (THREE) TIMES DAILY WITH MEALS What changed:  how much to take how to take this when to take this reasons to take this additional instructions   levothyroxine  88 MCG tablet Commonly known as: SYNTHROID  TAKE 1 AND 1/2 TABLET BY MOUTH EVERY SUNDAY. TAKE 1 TABLET BY MOUTH MON THROUGH SAT. TAKE ON AN EMPTY STOMACH WITH WATER ONLY. NO FOOD OR OTHER MEDICATIONS FOR 30 MINUTES.   metFORMIN  500 MG 24 hr tablet Commonly known as: GLUCOPHAGE -XR TAKE 2 TABLETS (1,000 MG TOTAL) BY MOUTH DAILY WITH BREAKFAST. FOR DIABETES. What changed: when to take this   montelukast  10 MG tablet Commonly known as: SINGULAIR  TAKE 1 TAB BY MOUTH AT BEDTIME FOR ALLERGIES AND ASTHMA.   NovoLOG  100 UNIT/ML injection Generic drug: insulin  aspart INJECT UP TO 70 UNITS INTO THE SKIN ONCE FOR 1 DOSE. VIA PUMP   OneTouch Verio test strip Generic drug: glucose blood Test blood sugar up to three times daily for diabetes.   oxyCODONE 5 MG immediate release tablet Commonly known as: Oxy IR/ROXICODONE Take 1 tablet (5 mg total) by mouth every 4 (four) hours as  needed for moderate pain (pain score 4-6) (pain score 4-6).   Ozempic  (1 MG/DOSE) 4 MG/3ML Sopn Generic drug: Semaglutide  (1 MG/DOSE) INJECT 1MG  INTO THE SKIN ONCE A WEEK   PROBIOTIC PO Take 1 capsule by mouth daily.   traMADol  50 MG tablet Commonly known as: ULTRAM  Take 1-2 tablets (50-100 mg total) by mouth every 4 (four) hours as needed for moderate pain (pain score 4-6).   V-Go 30 30 UNIT/24HR Kit USE AS DIRECTED   Wixela Inhub 250-50 MCG/ACT Aepb Generic drug: fluticasone -salmeterol INHALE 1 PUFF BY MOUTH TWICE A DAY What changed: See the new instructions.               Durable Medical Equipment  (From admission, onward)           Start     Ordered   07/21/23 0828  DME Bedside commode  Once       Comments: Patient is not able to walk the distance required to go the bathroom, or he/she is unable to safely negotiate stairs required to  access the bathroom. Due to body habitus a bariatric BSC is required.  A 3in1 BSC will alleviate this problem HT: 5'5" WT: 240lbs  Question:  Patient needs a bedside commode to treat with the following condition  Answer:  Total knee replacement status   07/21/23 0831   07/20/23 1137  DME Walker rolling  Once       Question:  Patient needs a walker to treat with the following condition  Answer:  Total knee replacement status   07/20/23 1136            Follow-up Information     Wadie Guile, PA-C Follow up on 08/04/2023.   Specialty: Orthopedic Surgery Why: at 1:15pm Contact information: [address] [place] [address] [phone]         Arlyne Lame, MD Follow up on 09/01/2023.   Specialty: Orthopedic Surgery Why: at 2:30pm Contact information: [address] [facility] [place] [address] [phone]                 Signed: Benjiman Bras 07/21/2023, 8:37 AM   Objective: Vital signs in last 24 hours: Temp:  [97 F (36.1 C)-99 F (37.2 C)] 98.2 F (36.8 C)  (05/06 0756) Pulse Rate:  [69-93] 70 (05/06 0756) Resp:  [11-18] 15 (05/06 0756) BP: (106-156)/(50-87) 138/69 (05/06 0756) SpO2:  [93 %-99 %] 99 % (05/06 0756)  Intake/Output from previous day:  Intake/Output Summary (Last 24 hours) at 07/21/2023 0837 Last data filed at 07/21/2023 0412 Gross per 24 hour  Intake 1840.18 ml  Output 360 ml  Net 1480.18 ml    Intake/Output this shift: No intake/output data recorded.  Labs: No results for input(s): "HGB" in the last 72 hours. No results for input(s): "WBC", "RBC", "HCT", "PLT" in the last 72 hours. Recent Labs    07/20/23 2104  GLUCOSE 451*   No results for input(s): "LABPT", "INR" in the last 72 hours.  EXAM: General - Patient is Alert, Appropriate, and Oriented Extremity - Neurologically intact Neurovascular intact Sensation intact distally Intact pulses distally Dorsiflexion/Plantar flexion intact No cellulitis present Compartment soft Dressing - dressing C/D/I and no drainage Motor Function - intact, moving foot and toes well on exam. JP Drain pulled without difficulty. Intact  Assessment/Plan: 1 Day Post-Op Procedure(s) (LRB): ARTHROPLASTY, KNEE, TOTAL, USING IMAGELESS COMPUTER-ASSISTED NAVIGATION (Left) Procedure(s) (LRB): ARTHROPLASTY, KNEE, TOTAL, USING IMAGELESS COMPUTER-ASSISTED NAVIGATION (Left) 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   Breast cancer (HCC)    Diabetes mellitus without complication (HCC)    Family history of breast cancer    Family history of colon cancer    Hypercholesteremia    Hypothyroidism    Lumbar herniated disc    4-5th lumbar   OSA (obstructive sleep apnea)    used to use CPAP, not any more over 1 1/2 yrs   Osteoarthritis    Salivary stone    left side   Sleep apnea    no cpap in 2 yrs    Vitamin D   deficiency    Principal Problem:   History of total knee arthroplasty, left  Estimated body mass index is 40.1 kg/m as calculated from the following:   Height as of this encounter: 5\' 5"  (1.651 m).   Weight as of this encounter:  109.3 kg.  Patient will continue to work with physical therapy on gait, ROM and strengthening   Discussed with the patient continuing to utilize Polar Care   Patient will use bone foam in 20-30 minute intervals   Patient will wear TED hose bilaterally to help prevent DVT and clot formation   Discussed the Aquacel bandage.  This bandage will stay in place 7 days postoperatively.  Can be replaced with honeycomb bandages that will be sent home with the patient   Discussed sending the patient home with tramadol  and oxycodone for as needed pain management.  Patient will also be sent home with Celebrex  to help with swelling and inflammation.  Patient will take an 81 mg aspirin twice daily for DVT prophylaxis   JP drain removed without difficulty, intact   Weight-Bearing as tolerated to left leg   Patient will follow-up with [facility] orthopedics in 2 weeks for staple removal and reevaluation  Diet - Regular diet Follow up - in 2 weeks Activity - WBAT Disposition - Home Condition Upon Discharge - Good DVT Prophylaxis - Aspirin and TED hose  Standley Earing, PA-C Orthopaedic Surgery 07/21/2023, 8:37 AM

## 2023-07-20 NOTE — Evaluation (Signed)
 Physical Therapy Evaluation Patient Details Name: Molly Wall MRN: [id number] DOB: 01/13/1946 Today's Date: 07/20/2023  History of Present Illness  Pt is s/p L TKA on 07/20/23.  Clinical Impression  Pt admitted with above diagnosis. Pt currently with functional limitations due to the deficits listed below (see PT Problem List). Pt received upright in bathroom finishing urinating. Per family and pt, at baseline she is independent with gait and ADL's at home. Plans to stay at her residence the first week with one sister then temporarily move in with her other daughter for a period of time.   To date, pt is CGA for STS, hand hygiene and ambulation back to recliner. Very slow cadence but pt does seem mildly loopy from pain medication but demo's safe use of RW and hand placement with education to descent. Education provided on polar care, car transfer, HEP, and d/c recs and typical stay/POC while here for elective joint replacement. Reviewed LLE positioning (to prevent knee flexion contracture and ROM norms). Pt left in care of family with polar care donned and L knee extended. Anticipate pending gait progression and stairs, pt will be appropriate for expected d/c recs after elective joint replacement to address acute deficits in strength, gait, and ROM to maximize return to PLOF.       If plan is discharge home, recommend the following: A little help with walking and/or transfers;A little help with bathing/dressing/bathroom;Assistance with cooking/housework;Assist for transportation;Help with stairs or ramp for entrance   Can travel by private vehicle        Equipment Recommendations Rolling walker (2 wheels);BSC/3in1  Recommendations for Other Services       Functional Status Assessment Patient has had a recent decline in their functional status and demonstrates the ability to make significant improvements in function in a reasonable and predictable amount of time.      Precautions / Restrictions Precautions Precautions: Knee Precaution Booklet Issued: Yes (comment) Recall of Precautions/Restrictions: Intact Restrictions Weight Bearing Restrictions Per Provider Order: Yes LLE Weight Bearing Per Provider Order: Weight bearing as tolerated      Mobility  Bed Mobility               General bed mobility comments: NT. Patient Response: Cooperative  Transfers Overall transfer level: Needs assistance Equipment used: Rolling walker (2 wheels) Transfers: Sit to/from Stand Sit to Stand: Contact guard assist                Ambulation/Gait Ambulation/Gait assistance: Supervision Gait Distance (Feet): 20 Feet Assistive device: Rolling walker (2 wheels) Gait Pattern/deviations: Step-to pattern, Decreased step length - right, Decreased stance time - left       General Gait Details: good use of RW  Stairs            Wheelchair Mobility     Tilt Bed Tilt Bed Patient Response: Cooperative  Modified Rankin (Stroke Patients Only)       Balance Overall balance assessment: Needs assistance Sitting-balance support: Feet supported, No upper extremity supported Sitting balance-Leahy Scale: Good     Standing balance support: Bilateral upper extremity supported, During functional activity, Reliant on assistive device for balance Standing balance-Leahy Scale: Fair                               Pertinent Vitals/Pain Pain Assessment Pain Assessment: Faces Faces Pain Scale: No hurt    Home Living Family/patient expects to be discharged to:: Private residence  Living Arrangements: Children Available Help at Discharge: Family;Available 24 hours/day (two daughters are alternating care) Type of Home: House Home Access: Level entry;Stairs to enter Entrance Stairs-Rails: Left Entrance Stairs-Number of Steps: 5 (personal house no steps. 5 steps for daughter's house)   Home Layout: Two level;Able to live on main level  with bedroom/bathroom Home Equipment: None      Prior Function Prior Level of Function : Independent/Modified Independent                     Extremity/Trunk Assessment   Upper Extremity Assessment Upper Extremity Assessment: Overall WFL for tasks assessed    Lower Extremity Assessment Lower Extremity Assessment: LLE deficits/detail LLE Deficits / Details: expected L TKA deficits in ROM and strength    Cervical / Trunk Assessment Cervical / Trunk Assessment: Normal  Communication   Communication Communication: No apparent difficulties    Cognition Arousal: Alert Behavior During Therapy: WFL for tasks assessed/performed   PT - Cognitive impairments: No apparent impairments                         Following commands: Intact       Cueing Cueing Techniques: Verbal cues     General Comments      Exercises Total Joint Exercises Ankle Circles/Pumps: AROM, Strengthening, Both, 5 reps, Seated Quad Sets: AROM, Strengthening, Left, 10 reps, Supine Heel Slides: AAROM, Left, 5 reps, Supine Hip ABduction/ADduction: AROM, Strengthening, Left, 10 reps, Supine Long Arc Quad: AAROM, Strengthening, Left, Seated Goniometric ROM: 0-48 Other Exercises Other Exercises: WB status, HEP (reps/sets/frequency), polar care, car transfer, safe use of RW   Assessment/Plan    PT Assessment Patient needs continued PT services  PT Problem List Decreased strength;Decreased mobility;Decreased balance       PT Treatment Interventions DME instruction;Therapeutic exercise;Gait training;Balance training;Stair training;Neuromuscular re-education;Functional mobility training;Therapeutic activities;Patient/family education    PT Goals (Current goals can be found in the Care Plan section)  Acute Rehab PT Goals Patient Stated Goal: to go home PT Goal Formulation: With patient/family Time For Goal Achievement: 08/03/23 Potential to Achieve Goals: Good    Frequency BID      Co-evaluation               AM-PAC PT "6 Clicks" Mobility  Outcome Measure Help needed turning from your back to your side while in a flat bed without using bedrails?: A Little Help needed moving from lying on your back to sitting on the side of a flat bed without using bedrails?: A Little Help needed moving to and from a bed to a chair (including a wheelchair)?: A Little Help needed standing up from a chair using your arms (e.g., wheelchair or bedside chair)?: A Little Help needed to walk in hospital room?: A Lot Help needed climbing 3-5 steps with a railing? : A Lot 6 Click Score: 16    End of Session Equipment Utilized During Treatment: Gait belt Activity Tolerance: Patient tolerated treatment well Patient left: in chair;with call bell/phone within reach;with family/visitor present Nurse Communication: Mobility status PT Visit Diagnosis: Other abnormalities of gait and mobility (R26.89);Muscle weakness (generalized) (M62.81);Difficulty in walking, not elsewhere classified (R26.2)    Time: 8841-6606 PT Time Calculation (min) (ACUTE ONLY): 34 min   Charges:   PT Evaluation $PT Eval Low Complexity: 1 Low PT Treatments $Therapeutic Exercise: 8-22 mins PT General Charges $$ ACUTE PT VISIT: 1 Visit        Marc Senior. Fairly IV,  PT, DPT Physical Therapist- [facility]  [facility]  07/20/2023, 4:14 PM

## 2023-07-21 ENCOUNTER — Encounter: Payer: Self-pay | Admitting: Orthopedic Surgery

## 2023-07-21 DIAGNOSIS — M1712 Unilateral primary osteoarthritis, left knee: Secondary | ICD-10-CM | POA: Diagnosis not present

## 2023-07-21 LAB — GLUCOSE, CAPILLARY
Glucose-Capillary: 128 mg/dL — ABNORMAL HIGH (ref 70–99)
Glucose-Capillary: 220 mg/dL — ABNORMAL HIGH (ref 70–99)
Glucose-Capillary: 243 mg/dL — ABNORMAL HIGH (ref 70–99)
Glucose-Capillary: 85 mg/dL (ref 70–99)

## 2023-07-21 MED ORDER — ACETAMINOPHEN 10 MG/ML IV SOLN
INTRAVENOUS | Status: AC
  Filled 2023-07-21: qty 100

## 2023-07-21 MED ORDER — TRAMADOL HCL 50 MG PO TABS: 50.0000 mg | ORAL_TABLET | ORAL | 0 refills | Status: DC | PRN

## 2023-07-21 MED ORDER — ASPIRIN 81 MG PO CHEW: 81.0000 mg | CHEWABLE_TABLET | Freq: Two times a day (BID) | ORAL | Status: DC

## 2023-07-21 MED ORDER — OXYCODONE HCL 5 MG PO TABS: 5.0000 mg | ORAL_TABLET | ORAL | 0 refills | Status: DC | PRN

## 2023-07-21 MED ORDER — CELECOXIB 200 MG PO CAPS: 200.0000 mg | ORAL_CAPSULE | Freq: Two times a day (BID) | ORAL | 1 refills | Status: DC

## 2023-07-21 NOTE — Plan of Care (Signed)
   Problem: Activity: Goal: Ability to avoid complications of mobility impairment will improve Outcome: Progressing   Problem: Pain Management: Goal: Pain level will decrease with appropriate interventions Outcome: Progressing

## 2023-07-21 NOTE — Inpatient Diabetes Management (Signed)
 Inpatient Diabetes Program Recommendations  AACE/ADA: New Consensus Statement on Inpatient Glycemic Control   Target Ranges:  Prepandial:   less than 140 mg/dL      Peak postprandial:   less than 180 mg/dL (1-2 hours)      Critically ill patients:  140 - 180 mg/dL    Latest Reference Range & Units 07/20/23 06:19 07/20/23 11:18 07/20/23 12:26 07/20/23 14:01 07/20/23 16:59 07/20/23 20:16 07/21/23 00:28 07/21/23 04:18 07/21/23 07:55  Glucose-Capillary 70 - 99 mg/dL 161 (H)  Novolog  5 units  Decadron  8 mg 279 (H)   305 (H)  Novolog  11 units   Novolog  6 units 334 (H)  Novolog  17 units 412 (H)  Novolog  15 units 243 (H)  Novolog  5 units 128 (H)  Novolog  2 units 85   Review of Glycemic Control  Diabetes history: DM2 Outpatient Diabetes medications: V-Go30 with Novolog  (provides 30 units for basal insulin  over 24 hours; takes 7 clicks with breakfast, 6 clicks with lunch, and 7 clicks with supper (each click is 2 units), Ozempic  1 mg Qweek, Metformin  XR 1000 mg QAM, also uses Humalog  pen when needed for hyperglycemia Current orders for Inpatient glycemic control: Novolog  0-15 units Q4H, Novolog  6 units TID with meals (for meal coverage if pt eats 50% of meals), and Insulin  Pump V-Go30  Inpatient Diabetes Program Recommendations:    Insulin : Noted glucose up to 451 mg/dl last night. Anticipate Decadron  given on 07/20/23 contributing to hyperglycemia. CBG 85 mg/dl this morning with Novolog  correction Q4H. Patient should continue V-Go30 insulin  pump (provides 30 units of basal insulin  over 24 hours), Novolog  correction, and Novolog  meal coverage while inpatient. Once patient is discharged, she can resume using insulin  pump as she was doing prior to admission.  Thanks, Beacher Limerick, RN, MSN, CDCES Diabetes Coordinator Inpatient Diabetes Program [phone] (Team Pager from 8am to 5pm)

## 2023-07-21 NOTE — TOC Progression Note (Signed)
 Transition of Care [facility]) - Progression Note    Patient Details  Name: Molly Wall MRN: [id number] Date of Birth: [birth year]  [facility]) CM/SW Contact  Alexandra Ice, RN Phone Number: 07/21/2023, 8:38 AM  Clinical Narrative:    Received message from Sam Creighton with [employer], patient is needing bariatric BSC. Order updated, and notified Sam Creighton.         Expected Discharge Plan and Services                                               Social Determinants of Health (SDOH) Interventions SDOH Screenings   Food Insecurity: No Food Insecurity (07/20/2023)  Housing: Low Risk  (07/20/2023)  Transportation Needs: No Transportation Needs (07/20/2023)  Utilities: Not At Risk (07/20/2023)  Depression (PHQ2-9): Low Risk  (04/20/2023)  Financial Resource Strain: Low Risk  (06/30/2023)   Received from [facility]  Physical Activity: Inactive (10/23/2022)  Social Connections: Moderately Integrated (07/20/2023)  Stress: No Stress Concern Present (10/23/2022)  Tobacco Use: Low Risk  (07/20/2023)  Recent Concern: Tobacco Use - Medium Risk (07/02/2023)   Received from [facility]  Health Literacy: Adequate Health Literacy (10/23/2022)    Readmission Risk Interventions     No data to display

## 2023-07-21 NOTE — Progress Notes (Signed)
 DISCHARGE NOTE:    Pt dc with IV removed and dc eduction given. Pt has both TED hose on and in place. Pt given 2 honeycomb dressings, medication scripts, and polar care. Pt has RW and 3 in 1 delivered to hospital room. Pt voices no questions or concerns at this time. Pt wheeled down to medical mall entrance by staff and pt's daughter provided transportation.

## 2023-07-21 NOTE — Evaluation (Signed)
 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     Clinical Impressions Pt seen for OT evaluation this date, POD#1 from above surgery. PTA pt was MOD I-I in ADL/IADL. Pt and daughter provided education re: polar care mgt, falls prevention strategies, home/routines modifications, DME/AE for LB bathing and dressing tasks, and compression stocking mgt via demo and hand out. Pt performs LB dressing with MIN A with use of AE, CGA for STS attempts. intermittent vcs required throughout for technique. Pt would benefit from skilled OT services including additional instruction in dressing techniques with or without assistive devices for dressing and bathing skills to support recall and carryover prior to discharge and ultimately to maximize safety, independence, and minimize falls risk and caregiver burden. OT will follow to facilitate optimal ADL performance.      If plan is discharge home, recommend the following:   A little help with walking and/or transfers;A little help with bathing/dressing/bathroom;Help with stairs or ramp for entrance;Assistance with cooking/housework     Functional Status Assessment   Patient has had a recent decline in their functional status and demonstrates the ability to make significant improvements in function in a reasonable and predictable amount of time.     Equipment Recommendations   BSC/3in1;Other (comment) (2WW)     Recommendations for Other Services         Precautions/Restrictions   Precautions Precautions: Knee Recall of Precautions/Restrictions: Intact Restrictions Weight Bearing Restrictions Per Provider Order: Yes LLE Weight Bearing Per Provider Order: Weight bearing as tolerated     Mobility Bed Mobility               General bed mobility comments: NT seated on edge of bed  pre/post session    Transfers Overall transfer level: Needs assistance Equipment used: Rolling walker (2 wheels) Transfers: Sit to/from Stand Sit to Stand: Supervision, Contact guard assist                  Balance Overall balance assessment: Needs assistance Sitting-balance support: Feet supported, No upper extremity supported Sitting balance-Leahy Scale: Good     Standing balance support: Bilateral upper extremity supported, During functional activity, Reliant on assistive device for balance Standing balance-Leahy Scale: Fair                             ADL either performed or assessed with clinical judgement   ADL Overall ADL's : Needs assistance/impaired Eating/Feeding: Set up;Sitting   Grooming: Set up;Sitting           Upper Body Dressing : Set up;Sitting     Lower Body Dressing Details (indicate cue type and reason): MAX A for TED hose donning by daughter, MIN A donning pants with reacher with intermittent vcs for technique Toilet Transfer: Contact guard assist;Rolling walker (2 wheels) Toilet Transfer Details (indicate cue type and reason): simulated Toileting- Clothing Manipulation and Hygiene: Supervision/safety;Sit to/from stand               Vision Patient Visual Report: No change from baseline       Perception         Praxis         Pertinent Vitals/Pain Pain Assessment Pain Assessment: 0-10 Pain Score: 6  Pain Location: L knee Pain Descriptors / Indicators: Grimacing, Guarding, Discomfort Pain Intervention(s): Premedicated  before session, Monitored during session, Limited activity within patient's tolerance, Repositioned, Ice applied     Extremity/Trunk Assessment Upper Extremity Assessment Upper Extremity Assessment: Overall WFL for tasks assessed   Lower Extremity Assessment Lower Extremity Assessment: LLE deficits/detail;Defer to PT evaluation LLE Deficits / Details: s/p L TKA       Communication  Communication Communication: No apparent difficulties   Cognition Arousal: Alert Behavior During Therapy: WFL for tasks assessed/performed Cognition: No apparent impairments                               Following commands: Intact       Cueing  General Comments   Cueing Techniques: Verbal cues  vss throughout   Exercises     Shoulder Instructions      Home Living Family/patient expects to be discharged to:: Private residence   Available Help at Discharge: Family;Available 24 hours/day Type of Home: House Home Access: Level entry;Stairs to enter Entrance Stairs-Number of Steps: 5- personal house has no steps, daugther has 5 steps where she will discharge Entrance Stairs-Rails: Left Home Layout: Two level;Able to live on main level with bedroom/bathroom         Bathroom Toilet: Standard Bathroom Accessibility: Yes   Home Equipment: None          Prior Functioning/Environment Prior Level of Function : Independent/Modified Independent                    OT Problem List: Decreased strength;Decreased activity tolerance;Impaired balance (sitting and/or standing);Decreased knowledge of use of DME or AE   OT Treatment/Interventions: Self-care/ADL training;DME and/or AE instruction;Therapeutic activities;Balance training;Therapeutic exercise;Patient/family education      OT Goals(Current goals can be found in the care plan section)   Acute Rehab OT Goals Patient Stated Goal: go home OT Goal Formulation: With patient/family Time For Goal Achievement: 08/04/23 ADL Goals Pt Will Perform Grooming: with modified independence;sitting;standing Pt Will Perform Lower Body Dressing: with modified independence;sitting/lateral leans;sit to/from stand Pt Will Transfer to Toilet: with modified independence;ambulating Pt Will Perform Toileting - Clothing Manipulation and hygiene: with modified independence;sitting/lateral leans;sit to/from stand   OT  Frequency:  Min 2X/week    Co-evaluation              AM-PAC OT "6 Clicks" Daily Activity     Outcome Measure Help from another person eating meals?: None Help from another person taking care of personal grooming?: None Help from another person toileting, which includes using toliet, bedpan, or urinal?: A Little Help from another person bathing (including washing, rinsing, drying)?: A Little Help from another person to put on and taking off regular upper body clothing?: None Help from another person to put on and taking off regular lower body clothing?: A Little 6 Click Score: 21   End of Session Equipment Utilized During Treatment: Rolling walker (2 wheels)  Activity Tolerance: Patient tolerated treatment well Patient left: with family/visitor present;with call bell/phone within reach;Other (comment) (sitting on edge of bed with daugther present)  OT Visit Diagnosis: Other abnormalities of gait and mobility (R26.89);Muscle weakness (generalized) (M62.81);Unsteadiness on feet (R26.81)                Time: 0981-1914 OT Time Calculation (min): 25 min Charges:  OT General Charges $OT Visit: 1 Visit OT Evaluation $OT Eval Low Complexity: 1 Low  Gerre Kraft, OTD OTR/L  07/21/23, 10:45 AM

## 2023-07-21 NOTE — Anesthesia Postprocedure Evaluation (Signed)
 Anesthesia Post Note  Patient: Molly Wall  Procedure(s) Performed: ARTHROPLASTY, KNEE, TOTAL, USING IMAGELESS COMPUTER-ASSISTED NAVIGATION (Left: Knee)  Patient location during evaluation: Nursing Unit Anesthesia Type: Combined General/Spinal Level of consciousness: awake Respiratory status: spontaneous breathing Postop Assessment: no headache Anesthetic complications: no   There were no known notable events for this encounter.   Last Vitals:  Vitals:   07/21/23 0028 07/21/23 0400  BP: (!) 106/50 (!) 120/55  Pulse: 69 71  Resp: 18 18  Temp:  (!) 36.2 C  SpO2: 95% 95%    Last Pain:  Vitals:   07/21/23 0536  TempSrc:   PainSc: Asleep                 Curvin Downing

## 2023-07-21 NOTE — Plan of Care (Signed)
  Problem: Education: Goal: Knowledge of General Education information will improve Description: Including pain rating scale, medication(s)/side effects and non-pharmacologic comfort measures Outcome: Progressing   Problem: Education: Goal: Ability to describe self-care measures that may prevent or decrease complications (Diabetes Survival Skills Education) will improve Outcome: Progressing   Problem: Metabolic: Goal: Ability to maintain appropriate glucose levels will improve Outcome: Progressing   Problem: Nutritional: Goal: Maintenance of adequate nutrition will improve Outcome: Progressing

## 2023-07-21 NOTE — Care Management Obs Status (Signed)
 MEDICARE OBSERVATION STATUS NOTIFICATION   Patient Details  Name: Molly Wall MRN: [id number] Date of Birth: 1945-05-16   Medicare Observation Status Notification Given:       Anise Kerns 07/21/2023, 1:07 PM

## 2023-07-21 NOTE — Progress Notes (Signed)
 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    PT Comments  Pt received upright in bed eating breakfast agreeable to PT. Began session with HEP with good understanding. Exits bed mod-I and stands to RW with VC's for hand placement and CGA. Bouts of momentum but able to complete without physical assist. VC's provided for reciprocal steps and L heel strike at initial contact with good carryover and improved gait velocity as gait distance/time progresses completing overall ~200'. Educated pt and daughter on forwards and sideways stair completion with pt deferring to sideways technique with railing. Completes LE sequencing correctly initially needing CGA but progresses to supervision. Pt and daughter feel confident in completing safely for home. Pt returns to sitting EOB with hand off to OT. With DME, pt remains appropriate with current D/c recs.    If plan is discharge home, recommend the following: A little help with walking and/or transfers;A little help with bathing/dressing/bathroom;Assistance with cooking/housework;Assist for transportation;Help with stairs or ramp for entrance   Can travel by private vehicle        Equipment Recommendations  Rolling walker (2 wheels);BSC/3in1    Recommendations for Other Services       Precautions / Restrictions Precautions Precautions: Knee Precaution Booklet Issued: Yes (comment) Recall of Precautions/Restrictions: Intact Restrictions Weight Bearing Restrictions Per Provider Order: Yes LLE Weight Bearing Per Provider Order: Weight bearing as tolerated     Mobility  Bed Mobility Overal bed mobility: Modified Independent               Patient Response: Cooperative  Transfers Overall transfer level: Needs assistance Equipment used: Rolling walker (2 wheels) Transfers: Sit to/from Stand Sit to  Stand: Contact guard assist                Ambulation/Gait Ambulation/Gait assistance: Supervision Gait Distance (Feet): 200 Feet   Gait Pattern/deviations: Step-to pattern, Decreased step length - right, Decreased stance time - left       General Gait Details: able to complete reciprocal gait with L heel strike at initial contact with VC's   Stairs Stairs: Yes Stairs assistance: Contact guard assist, Supervision Stair Management: One rail Left, Step to pattern, Sideways Number of Stairs: 4 General stair comments: PT demo prior to completion. Daughter educated and present to see how to assist pt if needed with RW. Good understanding with rail use and correct LE sequencing.   Wheelchair Mobility     Tilt Bed Tilt Bed Patient Response: Cooperative  Modified Rankin (Stroke Patients Only)       Balance Overall balance assessment: Needs assistance Sitting-balance support: Feet supported, No upper extremity supported Sitting balance-Leahy Scale: Good     Standing balance support: Bilateral upper extremity supported, During functional activity, Reliant on assistive device for balance Standing balance-Leahy Scale: Fair                              Hotel manager: No apparent difficulties  Cognition Arousal: Alert Behavior During Therapy: WFL for tasks assessed/performed   PT - Cognitive impairments: No apparent impairments                         Following commands: Intact      Cueing Cueing Techniques: Verbal cues  Exercises Total Joint Exercises Ankle Circles/Pumps:  AROM, Strengthening, Both, Supine, 10 reps Quad Sets: AROM, Strengthening, Left, 10 reps, Supine Short Arc Quad: AROM, Strengthening, Left, 10 reps, Supine Heel Slides: AROM, Strengthening, Left, 10 reps, Supine Hip ABduction/ADduction: AROM, Strengthening, Left, 10 reps, Supine Straight Leg Raises: AROM, Strengthening, Left, 10 reps,  Supine Long Arc Quad: AROM, Strengthening, Right, 10 reps Goniometric ROM: 0-65    General Comments        Pertinent Vitals/Pain Pain Assessment Pain Assessment: Faces Faces Pain Scale: Hurts a little bit Pain Location: L knee Pain Descriptors / Indicators: Grimacing, Guarding, Discomfort Pain Intervention(s): Limited activity within patient's tolerance, Monitored during session, Premedicated before session, Repositioned, Ice applied    Home Living                          Prior Function            PT Goals (current goals can now be found in the care plan section) Acute Rehab PT Goals Patient Stated Goal: to go home PT Goal Formulation: With patient/family Time For Goal Achievement: 08/03/23 Potential to Achieve Goals: Good Progress towards PT goals: Progressing toward goals    Frequency    BID      PT Plan      Co-evaluation              AM-PAC PT "6 Clicks" Mobility   Outcome Measure  Help needed turning from your back to your side while in a flat bed without using bedrails?: A Little Help needed moving from lying on your back to sitting on the side of a flat bed without using bedrails?: A Little Help needed moving to and from a bed to a chair (including a wheelchair)?: A Little Help needed standing up from a chair using your arms (e.g., wheelchair or bedside chair)?: A Little Help needed to walk in hospital room?: A Little Help needed climbing 3-5 steps with a railing? : A Little 6 Click Score: 18    End of Session Equipment Utilized During Treatment: Gait belt Activity Tolerance: Patient tolerated treatment well Patient left: in chair;with call bell/phone within reach;with family/visitor present Nurse Communication: Mobility status PT Visit Diagnosis: Other abnormalities of gait and mobility (R26.89);Muscle weakness (generalized) (M62.81);Difficulty in walking, not elsewhere classified (R26.2)     Time: 1610-9604 PT Time Calculation  (min) (ACUTE ONLY): 45 min  Charges:    $Gait Training: 23-37 mins $Therapeutic Exercise: 8-22 mins PT General Charges $$ ACUTE PT VISIT: 1 Visit                     Marc Senior. Fairly IV, PT, DPT Physical Therapist- [place]  [facility]  07/21/2023, 10:25 AM

## 2023-07-23 ENCOUNTER — Ambulatory Visit: Payer: Medicare Other | Admitting: Internal Medicine

## 2023-08-11 ENCOUNTER — Encounter: Payer: Self-pay | Admitting: Internal Medicine

## 2023-08-11 ENCOUNTER — Ambulatory Visit: Admitting: Internal Medicine

## 2023-08-11 VITALS — BP 126/80 | HR 76 | Temp 98.8°F | Ht 65.0 in | Wt 242.6 lb

## 2023-08-11 DIAGNOSIS — J4520 Mild intermittent asthma, uncomplicated: Secondary | ICD-10-CM

## 2023-08-11 DIAGNOSIS — G4733 Obstructive sleep apnea (adult) (pediatric): Secondary | ICD-10-CM

## 2023-08-11 DIAGNOSIS — Z6841 Body Mass Index (BMI) 40.0 and over, adult: Secondary | ICD-10-CM

## 2023-08-11 DIAGNOSIS — R0600 Dyspnea, unspecified: Secondary | ICD-10-CM

## 2023-08-11 DIAGNOSIS — R5381 Other malaise: Secondary | ICD-10-CM | POA: Diagnosis not present

## 2023-08-11 DIAGNOSIS — J45909 Unspecified asthma, uncomplicated: Secondary | ICD-10-CM | POA: Diagnosis not present

## 2023-08-11 DIAGNOSIS — E669 Obesity, unspecified: Secondary | ICD-10-CM

## 2023-08-11 NOTE — Progress Notes (Unsigned)
 Name: Molly Wall MRN: [id number] DOB: May 10, 1945    CHIEF COMPLAINT: Follow-up assessment for OSA Follow-up assessment for asthma    HISTORY OF PRESENT ILLNESS:  In 2020 patient was diagnosed with sleep apnea with AHI of 13 Patient has been on auto CPAP ever since  Discussed sleep data and reviewed with patient.  Encouraged proper weight management.  Discussed driving precautions and its relationship with hypersomnolence.  Discussed operating dangerous equipment and its relationship with hypersomnolence.  Discussed sleep hygiene, and benefits of a fixed sleep waked time.  The importance of getting eight or more hours of sleep discussed with patient.  Discussed limiting the use of the computer and television before bedtime.  Decrease naps during the day, so night time sleep will become enhanced.  Limit caffeine, and sleep deprivation.    CPAP compliance reviewed in detail with patient May 2025 Usage days 80% 100% for greater than 4 hours Auto CPAP 4-11 AHI measured at 31.6 I am uncertain why her AHI on CPAP has significant elevation when her HST shows AHI of 13 Patient will need in lab BiPAP or CPAP titration  Assessment of asthma Uses Wixela as needed No significant respiratory symptoms at this time No exacerbation at this time No evidence of heart failure at this time No evidence or signs of infection at this time No respiratory distress No fevers, chills, nausea, vomiting, diarrhea No evidence of lower extremity edema No evidence hemoptysis  PAST MEDICAL HISTORY :   has a past medical history of Adventitious breath sounds (11/20/2021), Allergic rhinitis, Allergy, Arthritis, Asthma, 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  has a past surgical history that includes Shoulder surgery (1976); Knee surgery (2014); Appendectomy; Ganglion cyst excision; Incision and drainage abscess anal; Colonoscopy; Breast lumpectomy with radioactive seed localization (Right, 08/12/2017); Tonsillectomy; Breast lumpectomy (Right); Breast biopsy (Left, 07/10/2023); and Knee Arthroplasty (Left, 07/20/2023). Prior to Admission medications   Medication Sig Start Date End Date Taking? Authorizing Provider  albuterol  (VENTOLIN  HFA) 108 (90 Base) MCG/ACT inhaler TAKE 2 PUFFS BY MOUTH EVERY 6 HOURS AS NEEDED FOR WHEEZE/SHORTNESS OF BREATH 04/23/23   Claire Crick, MD  atorvastatin  (LIPITOR) 20 MG tablet TAKE 1 TABLET BY MOUTH EVERY DAY FOR CHOLESTEROL 12/17/22   Gabriel John, NP  BD PEN NEEDLE NANO 2ND GEN 32G X 4 MM MISC USE AS DIRECTED 3 TIMES A DAY 08/19/22   Emilie Harden, MD  Continuous Blood Gluc Receiver (DEXCOM G6 RECEIVER) DEVI Use to monitor blood sugar 12/11/20   Gabriel John, NP  Continuous Glucose Sensor (DEXCOM G7 SENSOR) MISC Use to check glucose continuously,change every 10 days Dx Code: E11.65 05/15/23   Emilie Harden, MD  Continuous Glucose Transmitter (DEXCOM G6 TRANSMITTER) MISC REPLACE EVERY 3 (THREE) MONTHS. 01/29/23   Emilie Harden, MD  fluticasone  (FLONASE ) 50 MCG/ACT nasal spray INSTILL 1 SPRAY IN EACH NOSTRIL TWICE A DAY AS NEEDED FOR ALLERGIES OR RHINITIS 01/07/23   Clark, Katherine K, NP  gabapentin  (NEURONTIN ) 100 MG capsule Take 1-3 capsules by mouth once to twice daily for pain. Patient not taking: Reported on 05/15/2023 10/31/21   Clark, Katherine K, NP  Glucagon  3 MG/DOSE POWD Place 3 mg into the nose once as needed for up to 1 dose. 05/01/23   Emilie Harden,  MD  glucose blood (ONETOUCH VERIO) test strip Test blood sugar up to three times daily for diabetes. 02/25/23   Gabriel John, NP  insulin  aspart (NOVOLOG   FLEXPEN) 100 UNIT/ML FlexPen Inject 25 Units into the skin 3 (three) times daily before meals. 04/27/23   Emilie Harden, MD  Insulin  Disposable Pump (V-GO 30) 30 UNIT/24HR KIT USE AS DIRECTED 05/04/23   Emilie Harden, MD  insulin  lispro (HUMALOG  KWIKPEN) 100 UNIT/ML KwikPen INJECT 25 UNITS INTO THE SKIN 3 (THREE) TIMES DAILY WITH MEALS 10/21/22   Emilie Harden, MD  levothyroxine  (SYNTHROID ) 88 MCG tablet TAKE 1 AND 1/2 TABLET BY MOUTH EVERY SUNDAY. TAKE 1 TABLET BY MOUTH MON THROUGH SAT. TAKE ON AN EMPTY STOMACH WITH WATER ONLY. NO FOOD OR OTHER MEDICATIONS FOR 30 MINUTES. 12/17/22   Clark, Katherine K, NP  metFORMIN  (GLUCOPHAGE -XR) 500 MG 24 hr tablet TAKE 2 TABLETS (1,000 MG TOTAL) BY MOUTH DAILY WITH BREAKFAST. FOR DIABETES. 12/22/22   Emilie Harden, MD  montelukast  (SINGULAIR ) 10 MG tablet TAKE 1 TAB BY MOUTH AT BEDTIME FOR ALLERGIES AND ASTHMA. 12/17/22   Clark, Katherine K, NP  PREBIOTIC PRODUCT PO Take 1 capsule by mouth daily.    [provider]  Semaglutide , 1 MG/DOSE, (OZEMPIC , 1 MG/DOSE,) 4 MG/3ML SOPN INJECT 1MG  INTO THE SKIN ONCE A WEEK 07/22/22   Emilie Harden, MD  Zachery Hermes INHUB 250-50 MCG/ACT AEPB INHALE 1 PUFF BY MOUTH TWICE A DAY Patient taking differently: Inhale 1 puff into the lungs daily. 12/05/22   Clark, Katherine K, NP   Allergies  Allergen Reactions   Iodine-131 Anaphylaxis    Pt states when she was 21=she drank an experimental Iodinated Radioisotope for her thyroid . She described it was a small amount in a milk carton in [place] . She states they stopped using it shortly after because the trial wasn't successful. SPM   Prednisone      Make sugar jump way up.    FAMILY HISTORY:  family history includes Alzheimer's disease in her mother; Arthritis in her father; Asthma in her mother; Breast cancer (age of onset: 65) in her sister; Breast cancer (age of onset: 51) in her daughter; Colon cancer in her paternal grandmother; Dementia in her mother; Healthy  in her brother, daughter, daughter, and sister; Heart attack (age of onset: 60) in her father; Liver cancer in her maternal uncle. SOCIAL HISTORY:  reports that she has never smoked. She has never been exposed to tobacco smoke. She has never used smokeless tobacco. She reports that she does not currently use alcohol. She reports that she does not use drugs. BP 126/80 (BP Location: Right Arm, Patient Position: Sitting, Cuff Size: Large)   Pulse 76   Temp 98.8 F (37.1 C) (Oral)   Ht 5\' 5"  (1.651 m)   Wt 242 lb 9.6 oz (110 kg)   SpO2 96%   BMI 40.37 kg/m       Review of Systems: Gen:  Denies  fever, sweats, chills weight loss  HEENT: Denies blurred vision, double vision, ear pain, eye pain, hearing loss, nose bleeds, sore throat Cardiac:  No dizziness, chest pain or heaviness, chest tightness,edema, No JVD Resp:   No cough, -sputum production, -shortness of breath,-wheezing, -hemoptysis,  Other:  All other systems negative   Physical Examination:   General Appearance: No distress  EYES PERRLA, EOM intact.   NECK Supple, No JVD Pulmonary: normal breath sounds, No wheezing.  CardiovascularNormal S1,S2.  No m/r/g.   Abdomen: Benign, Soft, non-tender. Neurology UE/LE 5/5  strength, no focal deficits Ext pulses intact, cap refill intact ALL OTHER ROS ARE NEGATIVE    ASSESSMENT AND PLAN SYNOPSIS  78 year old pleasant white female seen today for follow up assessment of asthma and obstructive sleep apnea in the setting of obesity and deconditioned state   Assessment of OSA Previous AHI 13 Continue CPAP as prescribed  Excellent compliance report Reviewed compliance report in detail with patient Patient definitely benefits the use of CPAP therapy as prescribed Using CPAP nightly and with naps Pressure setting is comfortable and is sleeping well. CPAP prescription auto 4-11 AHI reduced to 31  No evidence of acute heart failure at this time No respiratory distress No  fevers, chills, nausea, vomiting, diarrhea No evidence hemoptysis  Patient Instructions Continue to use CPAP every night, minimum of 4-6 hours a night.  Change equipment every 30 days or as directed by DME.  Wash your tubing with warm soap and water daily, hang to dry. Wash humidifier portion weekly. Use bottled, distilled water and change daily   Be aware of reduced alertness and do not drive or operate heavy machinery if experiencing this or drowsiness.  Exercise encouraged, as tolerated. Encouraged proper weight management.  Important to get eight or more hours of sleep  Limiting the use of the computer and television before bedtime.  Decrease naps during the day, so night time sleep will become enhanced.  Limit caffeine, and sleep deprivation.  HTN, stroke, uncontrolled diabetes and heart failure are potential risk factors.  Risk of untreated sleep apnea including cardiac arrhthymias, stroke, DM, pulm HTN.  There is a significant discrepancy in her symptoms and her AHI Will obtain echocardiogram as well as an in lab CPAP titration study for further assessment  Obesity -recommend significant weight loss -recommend changing diet  Deconditioned state -Recommend increased daily activity and exercise    Asthma well-controlled No evidence of exacerbation at this time Avoid Allergens and Irritants Avoid secondhand smoke Avoid SICK contacts Recommend  Masking  when appropriate Recommend Keep up-to-date with vaccinations Continue to use inhalers as needed-use Wixela as needed  MEDICATION ADJUSTMENTS/LABS AND TESTS ORDERED: Continue to use inhalers as needed Wixela as needed Continue to use CPAP as prescribed Will order in-lab CPAP titration study Recommend weight loss  CURRENT MEDICATIONS REVIEWED AT LENGTH WITH PATIENT TODAY   Patient  satisfied with Plan of action and management. All questions answered  Follow up  3 months   I spent a total of  42 minutes reviewing  chart data, face-to-face evaluation with the patient, counseling and coordination of care as detailed above.    Lady Pier, M.D.  [facility]  Medical Director [facility] Medical Director [facility] Cardio-Pulmonary Department

## 2023-08-11 NOTE — Patient Instructions (Signed)
 Recommend in-lab CPAP titration study Obtain echocardiogram which is an ultrasound of your heart  Excellent Job A+ GOLD STAR!!  Continue CPAP as prescribed  Patient Instructions Continue to use CPAP every night, minimum of 4-6 hours a night.  Change equipment every 30 days or as directed by DME.  Wash your tubing with warm soap and water daily, hang to dry. Wash humidifier portion weekly. Use bottled, distilled water and change daily   Be aware of reduced alertness and do not drive or operate heavy machinery if experiencing this or drowsiness.  Exercise encouraged, as tolerated. Encouraged proper weight management.  Important to get eight or more hours of sleep  Limiting the use of the computer and television before bedtime.  Decrease naps during the day, so night time sleep will become enhanced.  Limit caffeine, and sleep deprivation.    Avoid Allergens and Irritants Avoid secondhand smoke Avoid SICK contacts Recommend  Masking  when appropriate Recommend Keep up-to-date with vaccinations  Recommend using Wixela as needed When you do use Wixela please rinse mouth out Recommend albuterol  as needed

## 2023-08-14 ENCOUNTER — Other Ambulatory Visit: Payer: Self-pay | Admitting: Internal Medicine

## 2023-08-20 ENCOUNTER — Ambulatory Visit: Admitting: Internal Medicine

## 2023-09-01 ENCOUNTER — Other Ambulatory Visit: Payer: Medicare Other

## 2023-09-03 ENCOUNTER — Other Ambulatory Visit: Payer: Self-pay | Admitting: Internal Medicine

## 2023-09-07 ENCOUNTER — Ambulatory Visit: Payer: Self-pay

## 2023-09-07 NOTE — Telephone Encounter (Signed)
 2nd attempt, no answer. Left voicemail for patient to return call for nurse triage.

## 2023-09-07 NOTE — Telephone Encounter (Signed)
 vomiting, stomach issues, due to issues unable to sleep. CB# CB: [phone]    3rd attempt to contact patient, no answer, LVM, will go ahead and route to clinic

## 2023-09-07 NOTE — Telephone Encounter (Signed)
 1st attempt, no answer. Left a voicemail for patient to return call to nurse triage.   vomiting, stomach issues, due to issues unable to sleep. CB# CB: [phone]

## 2023-09-07 NOTE — Telephone Encounter (Signed)
 vomiting, stomach issues, due to issues unable to sleep. CB# CB: [phone]     Reason for Disposition  Nausea lasts > 1 week  Answer Assessment - Initial Assessment Questions 1. NAUSEA SEVERITY: How bad is the nausea? (e.g., mild, moderate, severe; dehydration, weight loss)   - MILD: loss of appetite without change in eating habits   - MODERATE: decreased oral intake without significant weight loss, dehydration, or malnutrition   - SEVERE: inadequate caloric or fluid intake, significant weight loss, symptoms of dehydration     Moderate  2. ONSET: When did the nausea begin?     Ongoing problem 3. VOMITING: Any vomiting? If Yes, ask: How many times today?     Not now 4. RECURRENT SYMPTOM: Have you had nausea before? If Yes, ask: When was the last time? What happened that time?     No 5. CAUSE: What do you think is causing the nausea?     Recently stopped taking Tramadol  and has been feeling ill since that time  Protocols used: Nausea-A-AH    FYI Only or Action Required?: FYI only for provider.  Patient was last seen in primary care on 04/20/2023 by Rilla Baller, MD. Called Nurse Triage reporting Vomiting. Symptoms began ongoing since recently surgery. Symptoms are: unchanged.  Triage Disposition: See PCP When Office is Open (Within 3 Days), No Contact Calls  Patient/caregiver understands and will follow disposition?: Yes

## 2023-09-07 NOTE — Telephone Encounter (Signed)
 Noted, will evaluate.

## 2023-09-09 ENCOUNTER — Other Ambulatory Visit: Payer: Self-pay | Admitting: Primary Care

## 2023-09-09 ENCOUNTER — Ambulatory Visit: Payer: Self-pay | Admitting: Primary Care

## 2023-09-09 ENCOUNTER — Ambulatory Visit (INDEPENDENT_AMBULATORY_CARE_PROVIDER_SITE_OTHER): Admitting: Primary Care

## 2023-09-09 ENCOUNTER — Encounter: Payer: Self-pay | Admitting: Primary Care

## 2023-09-09 VITALS — BP 146/84 | HR 89 | Temp 97.2°F | Ht 65.0 in | Wt 239.0 lb

## 2023-09-09 DIAGNOSIS — R112 Nausea with vomiting, unspecified: Secondary | ICD-10-CM | POA: Insufficient documentation

## 2023-09-09 DIAGNOSIS — E785 Hyperlipidemia, unspecified: Secondary | ICD-10-CM

## 2023-09-09 LAB — BASIC METABOLIC PANEL WITH GFR
BUN: 19 mg/dL (ref 6–23)
CO2: 29 meq/L (ref 19–32)
Calcium: 9.3 mg/dL (ref 8.4–10.5)
Chloride: 99 meq/L (ref 96–112)
Creatinine, Ser: 0.85 mg/dL (ref 0.40–1.20)
GFR: 66.92 mL/min (ref >60.00)
Glucose, Bld: 162 mg/dL — ABNORMAL HIGH (ref 70–99)
Potassium: 4.6 meq/L (ref 3.5–5.1)
Sodium: 136 meq/L (ref 135–145)

## 2023-09-09 LAB — CBC
HCT: 38.8 % (ref 36.0–46.0)
Hemoglobin: 12.7 g/dL (ref 12.0–15.0)
MCHC: 32.6 g/dL (ref 30.0–36.0)
MCV: 85.1 fl (ref 78.0–100.0)
Platelets: 315 10*3/uL (ref 150.0–400.0)
RBC: 4.56 Mil/uL (ref 3.87–5.11)
RDW: 14.2 % (ref 11.5–15.5)
WBC: 5.4 10*3/uL (ref 4.0–10.5)

## 2023-09-09 LAB — LIPASE: Lipase: 13 U/L (ref 11.0–59.0)

## 2023-09-09 NOTE — Assessment & Plan Note (Addendum)
 Exam today benign and reassuring.  Suspect symptoms are secondary to initiating Ozempic  too quickly after discontinuation. Remain off Ozempic  until all symptoms resolved.  Resume Ozempic  at 0.25 mg weekly x 4 weeks, then 0.5 mg thereafter.  We discussed to discontinue permanently if she experiences a return her symptoms.  CBC, lipase and BMP pending today.  She has been instructed to update if symptoms do not resolve/improve over the next several days.

## 2023-09-09 NOTE — Patient Instructions (Addendum)
 Stop by the lab prior to leaving today. I will notify you of your results once received.   Remain off of Ozempic  until all of your symptoms go away.  It was a pleasure to see you today!

## 2023-09-09 NOTE — Telephone Encounter (Signed)
 Patient is due for annual follow up in September, this will be required prior to any further refills.  Please schedule, thank you!

## 2023-09-09 NOTE — Progress Notes (Signed)
 Subjective:    Patient ID: Molly Wall, female    DOB: October 19, 1945, 78 y.o.   MRN: [id number]  HPI  Molly Wall is a very pleasant 78 y.o. female with a history of asthma, OSA, hypothyroidism, type 2 diabetes, chronic back pain, hyperlipidemia, constipation who presents today   Symptom onset 2.5 weeks ago after discontinuation of Tramadol  for which she was taking for left total knee arthroplasty on 07/20/23.  Previously managed on oxycodone  before Tramadol . Symptoms included nausea, vomiting, diarrhea, chills, sweats, legs jolting.  She notified her surgeon who did not believe this was secondary to tramadol .   She is managed on Ozempic  1 mg weekly for diabetes per endocrinology. She discontinued her Ozempic  2 weeks prior to surgery and resumed about 3 weeks ago. She began at the 0.5 mg dose x 2 weeks, then increased to 1 mg weekly thereafter. Her symptoms began around the same time of resuming Ozempic . She discontinued her Ozempic  1 week ago and has noticed a slight improvement in symptoms.   Today she feels better but did not sleep well over the last few nights, belching up bile, gas, nausea. No vomiting over the last 2 weeks. She has been taking Miralax now as her diarrhea.   Review of Systems  Constitutional:  Negative for fever.  Gastrointestinal:  Positive for diarrhea, nausea and vomiting.         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   Breast cancer (HCC)    Diabetes mellitus without complication (HCC)    Family history of breast cancer    Family history of colon cancer    Hypercholesteremia    Hypothyroidism    Lumbar herniated disc    4-5th lumbar   Nausea and vomiting 09/09/2023   OSA (obstructive sleep apnea)    used to use CPAP, not any more over 1 1/2 yrs    Osteoarthritis    Salivary stone    left side   Sleep apnea    no cpap in 2 yrs    Vitamin D  deficiency     Social History   Socioeconomic History   Marital status: Divorced    Spouse name: Not on file   Number of children: 2   Years of education: Not on file   Highest education level: Not on file  Occupational History   Not on file  Tobacco Use   Smoking status: Never    Passive exposure: Never   Smokeless tobacco: Never  Vaping Use   Vaping status: Never Used  Substance and Sexual Activity   Alcohol use: Not Currently    Comment: rarely   Drug use: No   Sexual activity: Not Currently  Other Topics Concern   Not on file  Social History Narrative   Single.   Moved from [place]  to [place] several weeks ago.   Family lives in [place].   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   Social Drivers of Corporate investment banker Strain: Low Risk  (09/03/2023)   Received from [facility]   Overall Financial Resource Strain (CARDIA)    Difficulty of Paying Living Expenses: Not hard at all  Food Insecurity:  No Food Insecurity (09/03/2023)   Received from [facility]   Hunger Vital Sign    Within the past 12 months, you worried that your food would run out before you got the money to buy more.: Never true    Within the past 12 months, the food you bought just didn't last and you didn't have money to get more.: Never true  Transportation Needs: No Transportation Needs (09/03/2023)   Received from [facility] - Transportation    In the past 12 months, has lack of transportation kept you from medical appointments or from getting medications?: No    Lack of Transportation (Non-Medical): No  Physical Activity: Inactive (10/23/2022)   Exercise Vital Sign    Days of Exercise per Week: 0 days    Minutes of Exercise per Session: 0 min   Stress: No Stress Concern Present (10/23/2022)   [employer] of Occupational Health - Occupational Stress Questionnaire    Feeling of Stress : Not at all  Social Connections: Moderately Integrated (07/20/2023)   Social Connection and Isolation Panel    Frequency of Communication with Friends and Family: More than three times a week    Frequency of Social Gatherings with Friends and Family: More than three times a week    Attends Religious Services: More than 4 times per year    Active Member of [employer] or Organizations: Yes    Attends Banker Meetings: 1 to 4 times per year    Marital Status: Divorced  Catering manager Violence: Not At Risk (07/20/2023)   Humiliation, Afraid, Rape, and Kick questionnaire    Fear of Current or Ex-Partner: No    Emotionally Abused: No    Physically Abused: No    Sexually Abused: No    Past Surgical History:  Procedure Laterality Date   APPENDECTOMY     BREAST BIOPSY Left 07/10/2023   MM LT BREAST BX W LOC DEV 1ST LESION IMAGE BX SPEC STEREO GUIDE 07/10/2023 [facility]   BREAST LUMPECTOMY Right    per pt - precancerous   BREAST LUMPECTOMY WITH RADIOACTIVE SEED LOCALIZATION Right 08/12/2017   Procedure: RIGHT BREAST LUMPECTOMY X'S 2 WITH RADIOACTIVE SEED LOCALIZATION X'S 2;  Surgeon: Gail Favorite, MD;  Location: [facility];  Service: General;  Laterality: Right;   [place]     [place]  - polyps but given a 10 yr recall per pt    GANGLION CYST EXCISION     78 years old   INCISION AND DRAINAGE ABSCESS ANAL     KNEE ARTHROPLASTY Left 07/20/2023   Procedure: ARTHROPLASTY, KNEE, TOTAL, USING IMAGELESS COMPUTER-ASSISTED NAVIGATION;  Surgeon: Mardee Lynwood SQUIBB, MD;  Location: [facility];  Service: Orthopedics;  Laterality: Left;   KNEE SURGERY  2014   SHOULDER SURGERY  1974   TONSILLECTOMY     age 18    Family History  Problem Relation Age of Onset   Alzheimer's disease Mother    Asthma Mother     Dementia Mother        cause of death   Arthritis Father    Heart attack Father 78       cause of death   Breast cancer Sister 26   Healthy Sister    Breast cancer Daughter 60   Healthy Daughter    Healthy Daughter    Liver cancer Maternal Uncle    Colon cancer Paternal Grandmother    Healthy Brother  Colon polyps Neg Hx    Rectal cancer Neg Hx    Stomach cancer Neg Hx    Esophageal cancer Neg Hx     Allergies  Allergen Reactions   Iodine-131 Anaphylaxis    Pt states when she was 21=she drank an experimental Iodinated Radioisotope for her thyroid . She described it was a small amount in a milk carton in [place] . She states they stopped using it shortly after because the trial wasn't successful. SPM   Prednisone      Make sugar jump way up.    Current Outpatient Medications on File Prior to Visit  Medication Sig Dispense Refill   acetaminophen  (TYLENOL ) 650 MG CR tablet Take 1-2 tablets by mouth 3 (three) times daily as needed for pain.     albuterol  (VENTOLIN  HFA) 108 (90 Base) MCG/ACT inhaler TAKE 2 PUFFS BY MOUTH EVERY 6 HOURS AS NEEDED FOR WHEEZE/SHORTNESS OF BREATH 8.5 each 1   aspirin  81 MG chewable tablet Chew 1 tablet (81 mg total) by mouth 2 (two) times daily.     atorvastatin  (LIPITOR) 20 MG tablet TAKE 1 TABLET BY MOUTH EVERY DAY FOR CHOLESTEROL 90 tablet 2   azelastine  (ASTELIN ) 0.1 % nasal spray Place 2 sprays into both nostrils at bedtime. Use in each nostril as directed     celecoxib  (CELEBREX ) 200 MG capsule Take 1 capsule (200 mg total) by mouth 2 (two) times daily. 60 capsule 1   Continuous Blood Gluc Receiver (DEXCOM G6 RECEIVER) DEVI Use to monitor blood sugar 1 each 0   Continuous Glucose Sensor (DEXCOM G7 SENSOR) MISC Use to check glucose continuously,change every 10 days Dx Code: E11.65 9 each 4   fluticasone  (FLONASE ) 50 MCG/ACT nasal spray INSTILL 1 SPRAY IN EACH NOSTRIL TWICE A DAY AS NEEDED FOR ALLERGIES OR RHINITIS (Patient taking differently: Place  2 sprays into both nostrils at bedtime.) 48 mL 2   Glucagon  3 MG/DOSE POWD Place 3 mg into the nose once as needed for up to 1 dose. 1 each 11   glucose blood (ONETOUCH VERIO) test strip Test blood sugar up to three times daily for diabetes. 300 strip 1   Insulin  Disposable Pump (V-GO 30) 30 UNIT/24HR KIT USE AS DIRECTED 30 kit 5   insulin  lispro (HUMALOG  KWIKPEN) 100 UNIT/ML KwikPen INJECT 25 UNITS INTO THE SKIN 3 (THREE) TIMES DAILY WITH MEALS (Patient taking differently: Inject 1-14 Units into the skin as needed (high blood sugar).) 15 mL PRN   Insulin  Pen Needle (BD PEN NEEDLE NANO 2ND GEN) 32G X 4 MM MISC USE AS DIRECTED 3 TIMES A DAY 300 each 5   levothyroxine  (SYNTHROID ) 88 MCG tablet TAKE 1 AND 1/2 TABLET BY MOUTH EVERY SUNDAY. TAKE 1 TABLET BY MOUTH MON THROUGH SAT. TAKE ON AN EMPTY STOMACH WITH WATER ONLY. NO FOOD OR OTHER MEDICATIONS FOR 30 MINUTES. 96 tablet 2   metFORMIN  (GLUCOPHAGE -XR) 500 MG 24 hr tablet TAKE 2 TABLETS (1,000 MG TOTAL) BY MOUTH DAILY WITH BREAKFAST. FOR DIABETES. (Patient taking differently: Take 1,000 mg by mouth every evening. For diabetes.) 180 tablet 1   montelukast  (SINGULAIR ) 10 MG tablet TAKE 1 TAB BY MOUTH AT BEDTIME FOR ALLERGIES AND ASTHMA. 90 tablet 2   NOVOLOG  100 UNIT/ML injection INJECT UP TO 70 UNITS INTO THE SKIN ONCE FOR 1 DOSE. VIA PUMP 70 mL 5   Probiotic Product (PROBIOTIC PO) Take 1 capsule by mouth daily.     WIXELA INHUB 250-50 MCG/ACT AEPB INHALE 1 PUFF BY MOUTH TWICE A  DAY (Patient taking differently: Inhale 1 puff into the lungs at bedtime.) 60 each 9   oxyCODONE  (OXY IR/ROXICODONE ) 5 MG immediate release tablet Take 1 tablet (5 mg total) by mouth every 4 (four) hours as needed for moderate pain (pain score 4-6) (pain score 4-6). (Patient not taking: Reported on 09/09/2023) 30 tablet 0   Semaglutide , 1 MG/DOSE, (OZEMPIC , 1 MG/DOSE,) 4 MG/3ML SOPN INJECT 1 MG INTO THE SKIN ONE TIME PER WEEK (Patient not taking: Reported on 09/09/2023) 3 mL 11    traMADol  (ULTRAM ) 50 MG tablet Take 1-2 tablets (50-100 mg total) by mouth every 4 (four) hours as needed for moderate pain (pain score 4-6). (Patient not taking: Reported on 09/09/2023) 30 tablet 0   No current facility-administered medications on file prior to visit.    BP (!) 146/84   Pulse 89   Temp (!) 97.2 F (36.2 C) (Temporal)   Ht 5' 5 (1.651 m)   Wt 239 lb (108.4 kg)   SpO2 97%   BMI 39.77 kg/m  Objective:   Physical Exam  Cardiovascular:     Rate and Rhythm: Normal rate and regular rhythm.  Pulmonary:     Effort: Pulmonary effort is normal.     Breath sounds: Normal breath sounds.  Abdominal:     Palpations: Abdomen is soft.     Tenderness: There is no abdominal tenderness.   Musculoskeletal:     Cervical back: Neck supple.   Skin:    General: Skin is warm and dry.   Neurological:     Mental Status: She is alert and oriented to person, place, and time.   Psychiatric:        Mood and Affect: Mood normal.           Assessment & Plan:  Nausea and vomiting, unspecified vomiting type Assessment & Plan: Exam today benign and reassuring.  Suspect symptoms are secondary to initiating Ozempic  too quickly after discontinuation. Remain off Ozempic  until all symptoms resolved.  Resume Ozempic  at 0.25 mg weekly x 4 weeks, then 0.5 mg thereafter.  We discussed to discontinue permanently if she experiences a return her symptoms.  CBC, lipase and BMP pending today.  She has been instructed to update if symptoms do not resolve/improve over the next several days.  Orders: -     CBC -     Basic metabolic panel with GFR -     Lipase        Molly MARLA Gaskins, NP

## 2023-09-16 ENCOUNTER — Other Ambulatory Visit: Payer: Self-pay | Admitting: Primary Care

## 2023-09-16 DIAGNOSIS — J4520 Mild intermittent asthma, uncomplicated: Secondary | ICD-10-CM

## 2023-09-16 DIAGNOSIS — E039 Hypothyroidism, unspecified: Secondary | ICD-10-CM

## 2023-09-17 ENCOUNTER — Other Ambulatory Visit: Payer: Self-pay | Admitting: Primary Care

## 2023-09-17 DIAGNOSIS — Z794 Long term (current) use of insulin: Secondary | ICD-10-CM

## 2023-09-25 ENCOUNTER — Ambulatory Visit (HOSPITAL_BASED_OUTPATIENT_CLINIC_OR_DEPARTMENT_OTHER): Admit: 2023-09-25

## 2023-09-25 ENCOUNTER — Encounter (HOSPITAL_BASED_OUTPATIENT_CLINIC_OR_DEPARTMENT_OTHER): Payer: Self-pay

## 2023-09-25 SURGERY — MAXILLARY ANTROSTOMY
Anesthesia: General

## 2023-10-02 ENCOUNTER — Other Ambulatory Visit: Payer: Self-pay | Admitting: Primary Care

## 2023-10-02 ENCOUNTER — Ambulatory Visit (INDEPENDENT_AMBULATORY_CARE_PROVIDER_SITE_OTHER): Admitting: Otolaryngology

## 2023-10-02 DIAGNOSIS — J302 Other seasonal allergic rhinitis: Secondary | ICD-10-CM

## 2023-10-07 ENCOUNTER — Telehealth: Payer: Self-pay

## 2023-10-07 NOTE — Telephone Encounter (Unsigned)
 Copied from CRM #[id number]. Topic: Clinical - Medication Question >> Oct 07, 2023 12:06 PM Isabell A wrote: Reason for CRM: Patient states she ran out of a medication that's no longer on her list that she doesn't know the name of - its supposed to be taking at night with her CPAP, states its a nasal spray.  Callback number: [phone]

## 2023-10-14 ENCOUNTER — Ambulatory Visit
Admission: RE | Admit: 2023-10-14 | Discharge: 2023-10-14 | Disposition: A | Discharge status: Home / Self Care | Source: Ambulatory Visit | Attending: Hematology and Oncology | Admitting: Hematology and Oncology

## 2023-10-14 ENCOUNTER — Ambulatory Visit
Admission: RE | Admit: 2023-10-14 | Discharge: 2023-10-14 | Disposition: A | Discharge status: Home / Self Care | Source: Ambulatory Visit | Attending: Internal Medicine | Admitting: Internal Medicine

## 2023-10-14 DIAGNOSIS — G4733 Obstructive sleep apnea (adult) (pediatric): Secondary | ICD-10-CM | POA: Insufficient documentation

## 2023-10-14 DIAGNOSIS — I082 Rheumatic disorders of both aortic and tricuspid valves: Secondary | ICD-10-CM | POA: Diagnosis not present

## 2023-10-14 DIAGNOSIS — Z13820 Encounter for screening for osteoporosis: Secondary | ICD-10-CM | POA: Insufficient documentation

## 2023-10-14 DIAGNOSIS — M810 Age-related osteoporosis without current pathological fracture: Secondary | ICD-10-CM | POA: Diagnosis not present

## 2023-10-14 DIAGNOSIS — R0609 Other forms of dyspnea: Secondary | ICD-10-CM

## 2023-10-14 DIAGNOSIS — N6091 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: Secondary | ICD-10-CM | POA: Diagnosis not present

## 2023-10-14 DIAGNOSIS — Z780 Asymptomatic menopausal state: Secondary | ICD-10-CM | POA: Diagnosis not present

## 2023-10-14 DIAGNOSIS — M8588 Other specified disorders of bone density and structure, other site: Secondary | ICD-10-CM

## 2023-10-14 DIAGNOSIS — Z1231 Encounter for screening mammogram for malignant neoplasm of breast: Secondary | ICD-10-CM

## 2023-10-14 DIAGNOSIS — R0600 Dyspnea, unspecified: Secondary | ICD-10-CM | POA: Insufficient documentation

## 2023-10-14 LAB — ECHOCARDIOGRAM COMPLETE
AR max vel: 2.6 cm2
AV Area VTI: 2.89 cm2
AV Area mean vel: 2.26 cm2
AV Mean grad: 3 mmHg
AV Peak grad: 4.4 mmHg
Ao pk vel: 1.05 m/s
Area-P 1/2: 4.06 cm2
MV VTI: 2.62 cm2
S' Lateral: 2.8 cm

## 2023-10-14 NOTE — Progress Notes (Signed)
*  PRELIMINARY RESULTS* Echocardiogram 2D Echocardiogram has been performed.  Molly Wall 10/14/2023, 11:52 AM

## 2023-10-17 ENCOUNTER — Other Ambulatory Visit: Payer: Self-pay | Admitting: Family Medicine

## 2023-10-17 DIAGNOSIS — J45909 Unspecified asthma, uncomplicated: Secondary | ICD-10-CM

## 2023-10-19 ENCOUNTER — Ambulatory Visit: Payer: Self-pay | Admitting: Hematology and Oncology

## 2023-10-19 NOTE — Telephone Encounter (Signed)
-----   Message from Morna JAYSON Kendall sent at 10/19/2023 10:06 AM EDT ----- Bone density shows osteoporosis.  I recommend calcium , vitamin d  and weight bearing exercises.  Let her know that we can discuss further at her appt later this month. ----- Message ----- From: Interface, Rad Results In Sent: 10/15/2023   2:18 PM EDT To: Morna Dalton Kendall, NP

## 2023-10-19 NOTE — Telephone Encounter (Signed)
 Pt advised of bone density results and recommendations.  She is in agreement to take the vitamins recommended and talked of joining a gym for the weight bearing exercises

## 2023-10-21 ENCOUNTER — Other Ambulatory Visit: Payer: Self-pay | Admitting: Internal Medicine

## 2023-10-21 DIAGNOSIS — J301 Allergic rhinitis due to pollen: Secondary | ICD-10-CM

## 2023-10-21 MED ORDER — IPRATROPIUM BROMIDE 0.03 % NA SOLN: 2.0000 | Freq: Four times a day (QID) | NASAL | 1 refills | Status: DC

## 2023-10-21 NOTE — Progress Notes (Signed)
 Use as directed

## 2023-10-22 NOTE — Progress Notes (Deleted)
 Office Visit Note  Patient: Molly Wall             Date of Birth: 01/01/1946           MRN: [id number]             PCP: Gretta Comer POUR, NP Referring: Gretta Comer POUR, NP Visit Date: 11/04/2023 Occupation: @GUAROCC @  Subjective:  No chief complaint on file.   History of Present Illness: Molly Wall is a 78 y.o. female ***     Activities of Daily Living:  Patient reports morning stiffness for *** {minute/hour:19697}.   Patient {ACTIONS;DENIES/REPORTS:21021675::Denies} nocturnal pain.  Difficulty dressing/grooming: {ACTIONS;DENIES/REPORTS:21021675::Denies} Difficulty climbing stairs: {ACTIONS;DENIES/REPORTS:21021675::Denies} Difficulty getting out of chair: {ACTIONS;DENIES/REPORTS:21021675::Denies} Difficulty using hands for taps, buttons, cutlery, and/or writing: {ACTIONS;DENIES/REPORTS:21021675::Denies}  No Rheumatology ROS completed.   PMFS History:  Patient Active Problem List   Diagnosis Date Noted   Nausea and vomiting 09/09/2023   History of total knee arthroplasty, left 07/20/2023   OSA (obstructive sleep apnea) 06/21/2023   Primary osteoarthritis of both knees 06/21/2023   Epigastric discomfort 04/20/2023   Productive cough 02/11/2023   Chronic sinusitis 02/11/2023   Acute bronchitis 02/11/2023   Dizziness 11/20/2022   Neuropathic pain of both feet 10/31/2021   GERD (gastroesophageal reflux disease) 04/25/2021   Word finding difficulty 02/21/2020   Arthralgia of both hands 11/29/2019   Seasonal allergic rhinitis 11/29/2019   Chronic knee pain (intermittent) (Right) 11/01/2018   Chronic pain syndrome 10/31/2018   History iodine allergy 09/28/2018    Class: History of   DDD (degenerative disc disease), lumbar 09/27/2018   Grade 1 Anterolisthesis of L5/S1 09/16/2018   Lumbar facet arthropathy 09/16/2018   Arthropathy of lumbosacral facet joint 09/16/2018   Sialadenitis 08/24/2018   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 11/06/2017   Family history of breast cancer    Family history of colon cancer    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 08/12/2017   Constipation 04/13/2017   Chronic low back pain (Secondary area of Pain) (Bilateral) (R>L) 02/13/2016   Chronic lower extremity pain (Primary Area of Pain) (Right) 02/13/2016   Chronic lumbar radicular pain (Right) 02/13/2016   Type 2 diabetes mellitus with hyperglycemia, with long-term current use of insulin  (HCC) 07/18/2015   Vitamin D  deficiency 07/18/2015   Hyperlipidemia 12/13/2014   Asthma 12/13/2014   Hypothyroidism 12/13/2014   Lumbar herniated disc 12/13/2014    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   Breast cancer (HCC)    Diabetes mellitus without complication (HCC)    Family history of breast cancer    Family history of colon cancer    Hypercholesteremia    Hypothyroidism    Lumbar herniated disc    4-5th lumbar   Nausea and vomiting 09/09/2023   OSA (obstructive sleep apnea)    used to use CPAP, not any more over 1 1/2 yrs   Osteoarthritis    Salivary stone    left side   Sleep apnea    no cpap in 2 yrs    Vitamin D  deficiency     Family History  Problem Relation Age of Onset   Alzheimer's disease Mother    Asthma Mother  Dementia Mother        cause of death   Arthritis Father    Heart attack Father 43       cause of death   Breast cancer Sister 39   Healthy Sister    Breast cancer Daughter 38   Healthy Daughter    Healthy Daughter    Liver cancer Maternal Uncle    Colon cancer Paternal Grandmother    Healthy Brother    Colon polyps Neg Hx    Rectal cancer Neg Hx    Stomach cancer Neg Hx    Esophageal cancer Neg Hx    Past Surgical History:   Procedure Laterality Date   APPENDECTOMY     BREAST BIOPSY Left 07/10/2023   MM LT BREAST BX W LOC DEV 1ST LESION IMAGE BX SPEC STEREO GUIDE 07/10/2023 [facility]   BREAST LUMPECTOMY Right    per pt - precancerous   BREAST LUMPECTOMY WITH RADIOACTIVE SEED LOCALIZATION Right 08/12/2017   Procedure: RIGHT BREAST LUMPECTOMY X'S 2 WITH RADIOACTIVE SEED LOCALIZATION X'S 2;  Surgeon: Gail Favorite, MD;  Location: [facility];  Service: General;  Laterality: Right;   [place]     [place]  - [place] but given a 10 yr recall per pt    GANGLION CYST EXCISION     78 years old   INCISION AND DRAINAGE ABSCESS ANAL     KNEE ARTHROPLASTY Left 07/20/2023   Procedure: ARTHROPLASTY, KNEE, TOTAL, USING IMAGELESS COMPUTER-ASSISTED NAVIGATION;  Surgeon: Mardee Lynwood SQUIBB, MD;  Location: [facility];  Service: Orthopedics;  Laterality: Left;   KNEE SURGERY  2014   SHOULDER SURGERY  1976   TONSILLECTOMY     age 54   Social History   Social History Narrative   Single.   Moved from [place]  to [place] several weeks ago.   Family lives in [place].   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   Immunization History  Administered Date(s) Administered   Fluad Quad(high Dose 65+) 12/01/2018, 11/29/2019   Influenza,inj,Quad PF,6+ Mos 01/19/2015, 12/28/2015, 12/25/2016, 12/09/2017   Pneumococcal Conjugate-13 12/01/2018   Pneumococcal Polysaccharide-23 03/17/2014   Zoster, Live 07/18/2015     Objective: Vital Signs: There were no vitals taken for this visit.   Physical Exam   Musculoskeletal Exam: ***  CDAI Exam: CDAI Score: -- Patient Global: --; Provider Global: -- Swollen: --; Tender: -- Joint Exam 11/04/2023   No joint exam has been documented for this visit   There is currently no information documented on the homunculus. Go to the Rheumatology activity and complete the homunculus  joint exam.  Investigation: No additional findings.  Imaging: DG BONE DENSITY (DXA) Result Date: 10/15/2023 EXAM: DUAL X-RAY ABSORPTIOMETRY (DXA) FOR BONE MINERAL DENSITY 10/14/2023 1:02 pm CLINICAL DATA:  78 year old Female Postmenopausal. SCREENING FOR OSTEOPOROSIS TECHNIQUE: An axial (e.g., hips, spine) and/or appendicular (e.g., radius) exam was performed, as appropriate, using GE Secretary/administrator at [facility]. Images are obtained for bone mineral density measurement and are not obtained for diagnostic purposes. [id number] Exclusions: L3-L4 due to degenerative changes COMPARISON:  None. New baseline. FINDINGS: Scan quality: Good. LUMBAR SPINE (L1-L2): BMD (in g/cm2): 0.848 T-score: -2.7 Z-score: -0.9 LEFT FEMORAL NECK: BMD (in g/cm2): 0.756 T-score: -2.0 Z-score: -0.1 LEFT TOTAL HIP: BMD (in g/cm2): 0.862 T-score: -1.2 Z-score: 0.6 RIGHT FEMORAL NECK: BMD (in g/cm2): 0.804 T-score: -1.7 Z-score:  0.3 RIGHT TOTAL HIP: BMD (in g/cm2): 0.872 T-score: -1.1 Z-score: 0.7 FRAX 10-YEAR PROBABILITY OF FRACTURE: FRAX not reported as the lowest BMD is not in the osteopenia range. IMPRESSION: Osteoporosis based on BMD. Fracture risk is increased. Increased risk is based on low BMD. RECOMMENDATIONS: 1. All patients should optimize calcium  and vitamin D  intake. 2. Consider FDA-approved medical therapies in postmenopausal women and men aged 63 years and older, based on the following: - A hip or vertebral (clinical or morphometric) fracture - T-score less than or equal to -2.5 and secondary causes have been excluded. - Low bone mass (T-score between -1.0 and -2.5) and a 10-year probability of a hip fracture greater than or equal to 3% or a 10-year probability of a major osteoporosis-related fracture greater than or equal to 20% based on the [place] -adapted WHO algorithm. - Clinician judgment and/or patient preferences may indicate treatment for people with 10-year fracture probabilities above or  below these levels 3. Patients with diagnosis of osteoporosis or at high risk for fracture should have regular bone mineral density tests. For patients eligible for Medicare, routine testing is allowed once every 2 years. The testing frequency can be increased to one year for patients who have rapidly progressing disease, those who are receiving or discontinuing medical therapy to restore bone mass, or have additional risk factors. Electronically Signed   By: Reyes Phi M.D.   On: 10/15/2023 14:16   ECHOCARDIOGRAM COMPLETE Result Date: 10/14/2023    ECHOCARDIOGRAM REPORT   Patient Name:   Molly Wall Date of Exam: 10/14/2023 Medical Rec #:  [id number]                  Height:       65.0 in Accession #:    [id number]                 Weight:       239.0 lb Date of Birth:  1945-11-28                  BSA:          2.134 m Patient Age:    77 years                   BP:           146/84 mmHg Patient Gender: F                          HR:           89 bpm. Exam Location:  [facility] Procedure: 2D Echo, Cardiac Doppler and Color Doppler (Both Spectral and Color            Flow Doppler were utilized during procedure). Indications:     Obstructive sleep apnea G47.33                  Dyspnea R06.00  History:         Patient has no prior history of Echocardiogram examinations.                  Risk Factors:Diabetes and Sleep Apnea.  Sonographer:     Christopher Furnace Referring Phys:  [id number] NICKOLAS CELLAR Diagnosing Phys: Evalene Lunger MD  Sonographer Comments: Image acquisition challenging due to respiratory motion. IMPRESSIONS  1. Left ventricular ejection fraction, by estimation, is 60 to 65%. The left ventricle has normal function. The left ventricle has no regional wall  motion abnormalities. There is mild left ventricular hypertrophy. Left ventricular diastolic parameters are consistent with Grade I diastolic dysfunction (impaired relaxation).  2. Right ventricular systolic function is normal. The right ventricular size  is normal. There is normal pulmonary artery systolic pressure. The estimated right ventricular systolic pressure is 16.8 mmHg.  3. The mitral valve is normal in structure. No evidence of mitral valve regurgitation. No evidence of mitral stenosis.  4. The aortic valve is normal in structure. Aortic valve regurgitation is not visualized. Aortic valve sclerosis/calcification is present, without any evidence of aortic stenosis.  5. The inferior vena cava is normal in size with greater than 50% respiratory variability, suggesting right atrial pressure of 3 mmHg. FINDINGS  Left Ventricle: Left ventricular ejection fraction, by estimation, is 60 to 65%. The left ventricle has normal function. The left ventricle has no regional wall motion abnormalities. Strain was performed and the global longitudinal strain is indeterminate. The left ventricular internal cavity size was normal in size. There is mild left ventricular hypertrophy. Left ventricular diastolic parameters are consistent with Grade I diastolic dysfunction (impaired relaxation). Right Ventricle: The right ventricular size is normal. No increase in right ventricular wall thickness. Right ventricular systolic function is normal. There is normal pulmonary artery systolic pressure. The tricuspid regurgitant velocity is 1.72 m/s, and  with an assumed right atrial pressure of 5 mmHg, the estimated right ventricular systolic pressure is 16.8 mmHg. Left Atrium: Left atrial size was normal in size. Right Atrium: Right atrial size was normal in size. Pericardium: There is no evidence of pericardial effusion. Mitral Valve: The mitral valve is normal in structure. No evidence of mitral valve regurgitation. No evidence of mitral valve stenosis. MV peak gradient, 3.7 mmHg. The mean mitral valve gradient is 2.0 mmHg. Tricuspid Valve: The tricuspid valve is normal in structure. Tricuspid valve regurgitation is mild . No evidence of tricuspid stenosis. Aortic Valve: The aortic  valve is normal in structure. Aortic valve regurgitation is not visualized. Aortic valve sclerosis/calcification is present, without any evidence of aortic stenosis. Aortic valve mean gradient measures 3.0 mmHg. Aortic valve peak  gradient measures 4.4 mmHg. Aortic valve area, by VTI measures 2.89 cm. Pulmonic Valve: The pulmonic valve was normal in structure. Pulmonic valve regurgitation is not visualized. No evidence of pulmonic stenosis. Aorta: The aortic root is normal in size and structure. Venous: The inferior vena cava is normal in size with greater than 50% respiratory variability, suggesting right atrial pressure of 3 mmHg. IAS/Shunts: No atrial level shunt detected by color flow Doppler. Additional Comments: 3D was performed not requiring image post processing on an independent workstation and was indeterminate.  LEFT VENTRICLE PLAX 2D LVIDd:         4.40 cm   Diastology LVIDs:         2.80 cm   LV e' medial:    5.98 cm/s LV PW:         1.30 cm   LV E/e' medial:  10.2 LV IVS:        1.30 cm   LV e' lateral:   7.62 cm/s LVOT diam:     2.00 cm   LV E/e' lateral: 8.0 LV SV:         53 LV SV Index:   25 LVOT Area:     3.14 cm  RIGHT VENTRICLE RV Basal diam:  2.90 cm RV Mid diam:    2.20 cm RV S prime:     12.60 cm/s TAPSE (M-mode): 2.1  cm LEFT ATRIUM             Index        RIGHT ATRIUM           Index LA diam:        2.80 cm 1.31 cm/m   RA Area:     10.50 cm LA Vol (A2C):   25.7 ml 12.04 ml/m  RA Volume:   20.90 ml  9.80 ml/m LA Vol (A4C):   13.7 ml 6.42 ml/m LA Biplane Vol: 18.7 ml 8.76 ml/m  AORTIC VALVE AV Area (Vmax):    2.60 cm AV Area (Vmean):   2.26 cm AV Area (VTI):     2.89 cm AV Vmax:           105.00 cm/s AV Vmean:          80.900 cm/s AV VTI:            0.185 m AV Peak Grad:      4.4 mmHg AV Mean Grad:      3.0 mmHg LVOT Vmax:         87.00 cm/s LVOT Vmean:        58.200 cm/s LVOT VTI:          0.170 m LVOT/AV VTI ratio: 0.92  AORTA Ao Root diam: 3.20 cm MITRAL VALVE                TRICUSPID VALVE MV Area (PHT): 4.06 cm    TR Peak grad:   11.8 mmHg MV Area VTI:   2.62 cm    TR Vmax:        172.00 cm/s MV Peak grad:  3.7 mmHg MV Mean grad:  2.0 mmHg    SHUNTS MV Vmax:       0.97 m/s    Systemic VTI:  0.17 m MV Vmean:      61.1 cm/s   Systemic Diam: 2.00 cm MV Decel Time: 187 msec MV E velocity: 60.80 cm/s MV A velocity: 78.60 cm/s MV E/A ratio:  0.77 Evalene Lunger MD Electronically signed by Evalene Lunger MD Signature Date/Time: 10/14/2023/12:23:26 PM    Final     Recent Labs: Lab Results  Component Value Date   WBC 5.4 09/09/2023   HGB 12.7 09/09/2023   PLT 315.0 09/09/2023   NA 136 09/09/2023   K 4.6 09/09/2023   CL 99 09/09/2023   CO2 29 09/09/2023   GLUCOSE 162 (H) 09/09/2023   BUN 19 09/09/2023   CREATININE 0.85 09/09/2023   BILITOT 0.8 07/14/2023   ALKPHOS 90 07/14/2023   AST 16 07/14/2023   ALT 16 07/14/2023   PROT 7.1 07/14/2023   ALBUMIN 4.1 07/14/2023   CALCIUM  9.3 09/09/2023   GFRAA >60 01/06/2019    Speciality Comments: No specialty comments available.  Procedures:  No procedures performed Allergies: Iodine-131 and Prednisone    Assessment / Plan:     Visit Diagnoses: No diagnosis found.  Orders: No orders of the defined types were placed in this encounter.  No orders of the defined types were placed in this encounter.   Face-to-face time spent with patient was *** minutes. Greater than 50% of time was spent in counseling and coordination of care.  Follow-Up Instructions: No follow-ups on file.   Molly Wall, CMA  Note - This record has been created using Animal nutritionist.  Chart creation errors have been sought, but may not always  have been located. Such creation errors do not reflect  on  the standard of medical care.

## 2023-10-29 ENCOUNTER — Ambulatory Visit (INDEPENDENT_AMBULATORY_CARE_PROVIDER_SITE_OTHER): Payer: Medicare Other

## 2023-10-29 VITALS — Ht 65.0 in | Wt 239.0 lb

## 2023-10-29 DIAGNOSIS — Z0000 Encounter for general adult medical examination without abnormal findings: Secondary | ICD-10-CM | POA: Diagnosis not present

## 2023-10-29 NOTE — Progress Notes (Signed)
 Subjective:   Molly Wall is a 78 y.o. who presents for a Medicare Wellness preventive visit.  As a reminder, Annual Wellness Visits don't include a physical exam, and some assessments may be limited, especially if this visit is performed virtually. We may recommend an in-person follow-up visit with your provider if needed.  Visit Complete: Virtual I connected with  Molly Wall on 10/29/23 by a audio enabled telemedicine application and verified that I am speaking with the correct person using two identifiers.  Patient Location: Home  Provider Location: Home Office  I discussed the limitations of evaluation and management by telemedicine. The patient expressed understanding and agreed to proceed.  Vital Signs: Because this visit was a virtual/telehealth visit, some criteria may be missing or patient reported. Any vitals not documented were not able to be obtained and vitals that have been documented are patient reported.  VideoDeclined- This patient declined Librarian, academic. Therefore the visit was completed with audio only.  Persons Participating in Visit: Patient.  AWV Questionnaire: No: Patient Medicare AWV questionnaire was not completed prior to this visit.  Cardiac Risk Factors include: advanced age (>16men, >34 women);diabetes mellitus;dyslipidemia;hypertension;obesity (BMI >30kg/m2)     Objective:    Today's Vitals   10/29/23 0934 10/29/23 0935  Weight: 239 lb (108.4 kg)   Height: 5' 5 (1.651 m)   PainSc:  4    Body mass index is 39.77 kg/m.     10/29/2023    9:53 AM 07/20/2023    6:21 AM 07/14/2023   11:04 AM 11/11/2022   10:11 AM 10/23/2022    9:22 AM 10/18/2021    8:38 AM 05/17/2018    1:43 PM  Advanced Directives  Does Patient Have a Medical Advance Directive? Yes Yes Yes Yes Yes Yes Yes   Type of Estate agent of [place];Living will Healthcare Power of [place];Living will  Healthcare Power of [place];Living will Healthcare Power of [place];Living will Healthcare Power of [place];Living will Healthcare Power of [place];Living will Living will;Healthcare Power of Attorney  Does patient want to make changes to medical advance directive?  No - Patient declined No - Patient declined  No - Patient declined    Copy of Healthcare Power of Attorney in Chart? Yes - validated most recent copy scanned in chart (See row information) No - copy requested No - copy requested  Yes - validated most recent copy scanned in chart (See row information) Yes - validated most recent copy scanned in chart (See row information)      Data saved with a previous flowsheet row definition    Current Medications (verified) Outpatient Encounter Medications as of 10/29/2023  Medication Sig   acetaminophen  (TYLENOL ) 650 MG CR tablet Take 1-2 tablets by mouth 3 (three) times daily as needed for pain.   albuterol  (VENTOLIN  HFA) 108 (90 Base) MCG/ACT inhaler TAKE 2 PUFFS BY MOUTH EVERY 6 HOURS AS NEEDED FOR WHEEZE/SHORTNESS OF BREATH   aspirin  81 MG chewable tablet Chew 1 tablet (81 mg total) by mouth 2 (two) times daily.   atorvastatin  (LIPITOR) 20 MG tablet TAKE 1 TABLET BY MOUTH EVERY DAY FOR CHOLESTEROL   azelastine  (ASTELIN ) 0.1 % nasal spray Place 2 sprays into both nostrils at bedtime. Use in each nostril as directed   celecoxib  (CELEBREX ) 200 MG capsule Take 1 capsule (200 mg total) by mouth 2 (two) times daily.   Continuous Blood Gluc Receiver (DEXCOM G6 RECEIVER) DEVI Use to monitor blood sugar  Continuous Glucose Sensor (DEXCOM G7 SENSOR) MISC Use to check glucose continuously,change every 10 days Dx Code: E11.65   fluticasone  (FLONASE ) 50 MCG/ACT nasal spray INSTILL 1 SPRAY IN EACH NOSTRIL TWICE A DAY AS NEEDED FOR ALLERGIES OR RHINITIS   Glucagon  3 MG/DOSE POWD Place 3 mg into the nose once as needed for up to 1 dose.   Insulin  Disposable Pump (V-GO 30) 30 UNIT/24HR KIT USE AS DIRECTED    insulin  lispro (HUMALOG  KWIKPEN) 100 UNIT/ML KwikPen INJECT 25 UNITS INTO THE SKIN 3 (THREE) TIMES DAILY WITH MEALS   Insulin  Pen Needle (BD PEN NEEDLE NANO 2ND GEN) 32G X 4 MM MISC USE AS DIRECTED 3 TIMES A DAY   ipratropium (ATROVENT ) 0.03 % nasal spray Place 2 sprays into the nose 4 (four) times daily.   levothyroxine  (SYNTHROID ) 88 MCG tablet TAKE 1 AND 1/2 TABLET BY MOUTH EVERY SUNDAY. TAKE 1 TABLET BY MOUTH MON THROUGH SAT. TAKE ON AN EMPTY STOMACH WITH WATER ONLY. NO FOOD OR OTHER MEDICATIONS FOR 30 MINUTES.   metFORMIN  (GLUCOPHAGE -XR) 500 MG 24 hr tablet TAKE 2 TABLETS (1,000 MG TOTAL) BY MOUTH DAILY WITH BREAKFAST. FOR DIABETES.   montelukast  (SINGULAIR ) 10 MG tablet TAKE 1 TAB BY MOUTH AT BEDTIME FOR ALLERGIES AND ASTHMA.   NOVOLOG  100 UNIT/ML injection INJECT UP TO 70 UNITS INTO THE SKIN ONCE FOR 1 DOSE. VIA PUMP   ONETOUCH VERIO test strip TEST BLOOD SUGAR UP TO THREE TIMES DAILY FOR DIABETES.   Probiotic Product (PROBIOTIC PO) Take 1 capsule by mouth daily.   Semaglutide , 1 MG/DOSE, (OZEMPIC , 1 MG/DOSE,) 4 MG/3ML SOPN INJECT 1 MG INTO THE SKIN ONE TIME PER WEEK   WIXELA INHUB 250-50 MCG/ACT AEPB INHALE 1 PUFF BY MOUTH TWICE A DAY (Patient taking differently: Inhale 1 puff into the lungs at bedtime.)   oxyCODONE  (OXY IR/ROXICODONE ) 5 MG immediate release tablet Take 1 tablet (5 mg total) by mouth every 4 (four) hours as needed for moderate pain (pain score 4-6) (pain score 4-6). (Patient not taking: Reported on 10/29/2023)   traMADol  (ULTRAM ) 50 MG tablet Take 1-2 tablets (50-100 mg total) by mouth every 4 (four) hours as needed for moderate pain (pain score 4-6). (Patient not taking: Reported on 10/29/2023)   No facility-administered encounter medications on file as of 10/29/2023.    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   Breast cancer (HCC)    Diabetes mellitus without complication (HCC)    Family history of breast cancer    Family history of colon cancer    Hypercholesteremia    Hypothyroidism    Lumbar herniated disc    4-5th lumbar   Nausea and vomiting 09/09/2023   OSA (obstructive sleep apnea)    used to use CPAP, not any more over 1 1/2 yrs   Osteoarthritis    Salivary stone    left side   Sleep apnea    no cpap in 2 yrs    Vitamin D  deficiency    Past Surgical History:  Procedure Laterality Date   APPENDECTOMY     BREAST BIOPSY Left 07/10/2023   MM LT BREAST BX W LOC DEV 1ST LESION IMAGE BX SPEC STEREO GUIDE 07/10/2023 [facility]   BREAST LUMPECTOMY Right  per pt - precancerous   BREAST LUMPECTOMY WITH RADIOACTIVE SEED LOCALIZATION Right 08/12/2017   Procedure: RIGHT BREAST LUMPECTOMY X'S 2 WITH RADIOACTIVE SEED LOCALIZATION X'S 2;  Surgeon: Gail Favorite, MD;  Location: [facility];  Service: General;  Laterality: Right;   [place]     [place]  - [place] but given a 10 yr recall per pt    GANGLION CYST EXCISION     78 years old   INCISION AND DRAINAGE ABSCESS ANAL     KNEE ARTHROPLASTY Left 07/20/2023   Procedure: ARTHROPLASTY, KNEE, TOTAL, USING IMAGELESS COMPUTER-ASSISTED NAVIGATION;  Surgeon: Mardee Lynwood SQUIBB, MD;  Location: [facility];  Service: Orthopedics;  Laterality: Left;   KNEE SURGERY  2014   SHOULDER SURGERY  1976   TONSILLECTOMY     age 70   Family History  Problem Relation Age of Onset   Alzheimer's disease Mother    Asthma Mother    Dementia Mother        cause of death   Arthritis Father    Heart attack Father 60       cause of death   Breast cancer Sister 35   Healthy Sister    Breast cancer Daughter 44   Healthy Daughter    Healthy Daughter    Liver cancer Maternal Uncle    Colon cancer Paternal Grandmother    Healthy Brother    Colon polyps  Neg Hx    Rectal cancer Neg Hx    Stomach cancer Neg Hx    Esophageal cancer Neg Hx    Social History   Socioeconomic History   Marital status: Divorced    Spouse name: Not on file   Number of children: 2   Years of education: Not on file   Highest education level: Not on file  Occupational History   Not on file  Tobacco Use   Smoking status: Never    Passive exposure: Never   Smokeless tobacco: Never  Vaping Use   Vaping status: Never Used  Substance and Sexual Activity   Alcohol use: Not Currently    Comment: rarely   Drug use: No   Sexual activity: Not Currently  Other Topics Concern   Not on file  Social History Narrative   Single.   Moved from [place]  to [place] several weeks ago.   Family lives in [place].   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   Social Drivers of Corporate investment banker Strain: Low Risk  (10/29/2023)   Overall Financial Resource Strain (CARDIA)    Difficulty of Paying Living Expenses: Not hard at all  Food Insecurity: No Food Insecurity (10/29/2023)   Hunger Vital Sign    Worried About Running Out of Food in the Last Year: Never true    Ran Out of Food in the Last Year: Never true  Transportation Needs: No Transportation Needs (10/29/2023)   PRAPARE - Administrator, Civil Service (Medical): No    Lack of Transportation (Non-Medical): No  Physical Activity: Insufficiently Active (10/29/2023)   Exercise Vital Sign    Days of Exercise per Week: 4 days    Minutes of Exercise per Session: 30 min  Stress: No Stress Concern Present (10/29/2023)   [employer] of Occupational Health - Occupational Stress Questionnaire    Feeling of Stress: Only a little  Social Connections: Moderately Integrated (10/29/2023)  Social Advertising account executive    Frequency of Communication with Friends and Family: More than three times a week    Frequency  of Social Gatherings with Friends and Family: More than three times a week    Attends Religious Services: More than 4 times per year    Active Member of [employer] or Organizations: Yes    Attends Banker Meetings: 1 to 4 times per year    Marital Status: Divorced    Tobacco Counseling Counseling given: Not Answered    Clinical Intake:  Pre-visit preparation completed: Yes  Pain : 0-10 Pain Score: 4  Pain Type: Chronic pain Pain Location: Knee Pain Orientation: Right, Left Pain Descriptors / Indicators: Aching Pain Onset: More than a month ago Pain Frequency: Intermittent Pain Relieving Factors: Tylenol   Pain Relieving Factors: Tylenol   BMI - recorded: 39.77 Nutritional Status: BMI > 30  Obese Nutritional Risks: None Diabetes: Yes CBG done?: Yes (BS 123 this am now) CBG resulted in Enter/ Edit results?: No Did pt. bring in CBG monitor from home?: No  Lab Results  Component Value Date   HGBA1C 6.7 (H) 07/14/2023   HGBA1C 7.3 (A) 03/24/2023   HGBA1C 7.5 (H) 03/24/2023     How often do you need to have someone help you when you read instructions, pamphlets, or other written materials from your doctor or pharmacy?: 1 - Never  Interpreter Needed?: No  Comments: lives alone;grandson lives with her but gone working [employer] entered by :: B.Zakia Sainato,LPN   Activities of Daily Living     10/29/2023    9:53 AM 07/20/2023   12:52 PM  In your present state of health, do you have any difficulty performing the following activities:  Hearing? 0   Vision? 0   Difficulty concentrating or making decisions? 0   Walking or climbing stairs? 0   Dressing or bathing? 0   Doing errands, shopping? 0 0  Preparing Food and eating ? N   Using the Toilet? N   In the past six months, have you accidently leaked urine? N   Do you have problems with loss of bowel control? N   Managing your Medications? N   Managing your Finances? N   Housekeeping or managing your  Housekeeping? N     Patient Care Team: Gretta Comer POUR, NP as PCP - General (Nurse Practitioner) Legrand Victory LITTIE DOUGLAS, MD as Consulting Physician (Gastroenterology) Crawford Morna Pickle, NP as Nurse Practitioner (Hematology and Oncology) [facility], [facility]  I have updated your Care Teams any recent Medical Services you may have received from other providers in the past year.     Assessment:   This is a routine wellness examination for [place].  Hearing/Vision screen Hearing Screening - Comments:: Pt says her hearing is very good Vision Screening - Comments:: Pt says her vision is good;readers only Dr Mevelyn   Goals Addressed             This Visit's Progress    Patient Stated   On track    10/29/23-get diabetes under control     Patient Stated   On track    Keep A1C down        Depression Screen     10/29/2023    9:50 AM 04/20/2023    9:47 AM 02/11/2023    4:26 PM 10/23/2022    9:20 AM 07/21/2022   10:58 AM 10/18/2021    8:40 AM 04/25/2021    2:58  PM  PHQ 2/9 Scores  PHQ - 2 Score 0 0 0 0 0 0 0  PHQ- 9 Score  3 3    0    Fall Risk     10/29/2023    9:40 AM 04/20/2023    9:47 AM 02/11/2023    4:26 PM 11/20/2022   10:20 AM 10/23/2022    9:22 AM  Fall Risk   Falls in the past year? 0 1 0 0 0  Number falls in past yr: 0 0 0 0 0  Injury with Fall? 0 0 0 0 0  Risk for fall due to : No Fall Risks  No Fall Risks No Fall Risks Impaired vision  Follow up Education provided;Falls prevention discussed  Falls evaluation completed Falls evaluation completed Falls prevention discussed    MEDICARE RISK AT HOME: Medicare Risk at Home Any stairs in or around the home?: Yes If so, are there any without handrails?: Yes Home free of loose throw rugs in walkways, pet beds, electrical cords, etc?: Yes Adequate lighting in your home to reduce risk of falls?: Yes Life alert?: No Use of a cane, walker or w/c?: No Grab bars in the bathroom?: No Shower chair or bench in shower?:  No Elevated toilet seat or a handicapped toilet?: No  TIMED UP AND GO:  Was the test performed?  No  Cognitive Function: 6CIT completed        10/29/2023    9:56 AM 10/23/2022    9:23 AM 10/18/2021    8:41 AM  6CIT Screen  What Year? 0 points 0 points 0 points  What month? 0 points 0 points 0 points  What time? 0 points 0 points 0 points  Count back from 20 0 points 0 points 0 points  Months in reverse 0 points 0 points 0 points  Repeat phrase 2 points 0 points 8 points  Total Score 2 points 0 points 8 points    Immunizations Immunization History  Administered Date(s) Administered   Fluad Quad(high Dose 65+) 12/01/2018, 11/29/2019   Influenza,inj,Quad PF,6+ Mos 01/19/2015, 12/28/2015, 12/25/2016, 12/09/2017   Pneumococcal Conjugate-13 12/01/2018   Pneumococcal Polysaccharide-23 03/17/2014   Zoster, Live 07/18/2015    Screening Tests Health Maintenance  Topic Date Due   Diabetic kidney evaluation - Urine ACR  Never done   INFLUENZA VACCINE  10/16/2023   FOOT EXAM  11/18/2023   HEMOGLOBIN A1C  01/13/2024   MAMMOGRAM  07/01/2024   OPHTHALMOLOGY EXAM  07/14/2024   Diabetic kidney evaluation - eGFR measurement  09/08/2024   Medicare Annual Wellness (AWV)  10/28/2024   DEXA SCAN  10/13/2025   Pneumococcal Vaccine: 50+ Years  Completed   Hepatitis C Screening  Completed   HPV VACCINES  Aged Out   Meningococcal B Vaccine  Aged Out   DTaP/Tdap/Td  Discontinued   Colonoscopy  Discontinued   COVID-19 Vaccine  Discontinued   Zoster Vaccines- Shingrix  Discontinued    Health Maintenance  Health Maintenance Due  Topic Date Due   Diabetic kidney evaluation - Urine ACR  Never done   INFLUENZA VACCINE  10/16/2023   Health Maintenance Items Addressed: None due at this time  Additional Screening:  Vision Screening: Recommended annual ophthalmology exams for early detection of glaucoma and other disorders of the eye. Would you like a referral to an eye doctor? No     Dental Screening: Recommended annual dental exams for proper oral hygiene  Community Resource Referral / Chronic Care Management: CRR required this  visit?  No   CCM required this visit?  No   Plan:    I have personally reviewed and noted the following in the patient's chart:   Medical and social history Use of alcohol, tobacco or illicit drugs  Current medications and supplements including opioid prescriptions. Patient is not currently taking opioid prescriptions. Functional ability and status Nutritional status Physical activity Advanced directives List of other physicians Hospitalizations, surgeries, and ER visits in previous 12 months Vitals Screenings to include cognitive, depression, and falls Referrals and appointments  In addition, I have reviewed and discussed with patient certain preventive protocols, quality metrics, and best practice recommendations. A written personalized care plan for preventive services as well as general preventive health recommendations were provided to patient.   Erminio LITTIE Saris, LPN   [id number]   After Visit Summary: (MyChart) Due to this being a telephonic visit, the after visit summary with patients personalized plan was offered to patient via MyChart   Notes: Nothing significant to report at this time.

## 2023-10-29 NOTE — Patient Instructions (Signed)
 [name] , Thank you for taking time out of your busy schedule to complete your Annual Wellness Visit with me. I enjoyed our conversation and look forward to speaking with you again next year. I, as well as your care team,  appreciate your ongoing commitment to your health goals. Please review the following plan we discussed and let me know if I can assist you in the future. Your Game plan/ To Do List    Referrals: If you haven't heard from the office you've been referred to, please reach out to them at the phone provided.   Follow up Visits: We will see or speak with you next year for your Next Medicare AWV with our clinical staff 10/31/24 @ 9:30am telephone Have you seen your provider in the last 6 months (3 months if uncontrolled diabetes)? Yes  Clinician Recommendations:  Aim for 30 minutes of exercise or brisk walking, 6-8 glasses of water, and 5 servings of fruits and vegetables each day.       This is a list of the screenings recommended for you:  Health Maintenance  Topic Date Due   Yearly kidney health urinalysis for diabetes  Never done   Eye exam for diabetics  07/14/2023   Flu Shot  10/16/2023   Complete foot exam   11/18/2023   Hemoglobin A1C  01/13/2024   Mammogram  07/01/2024   Yearly kidney function blood test for diabetes  09/08/2024   Medicare Annual Wellness Visit  10/28/2024   DEXA scan (bone density measurement)  10/13/2025   Pneumococcal Vaccine for age over 25  Completed   Hepatitis C Screening  Completed   HPV Vaccine  Aged Out   Meningitis B Vaccine  Aged Out   DTaP/Tdap/Td vaccine  Discontinued   Colon Cancer Screening  Discontinued   COVID-19 Vaccine  Discontinued   Zoster (Shingles) Vaccine  Discontinued    Advanced directives: (In Chart) A copy of your advanced directives are scanned into your chart should your provider ever need it. Advance Care Planning is important because it:  [x]  Makes sure you receive the medical care that is  consistent with your values, goals, and preferences  [x]  It provides guidance to your family and loved ones and reduces their decisional burden about whether or not they are making the right decisions based on your wishes.  Follow the link provided in your after visit summary or read over the paperwork we have mailed to you to help you started getting your Advance Directives in place. If you need assistance in completing these, please reach out to us  so that we can help you!

## 2023-11-04 ENCOUNTER — Ambulatory Visit: Admitting: Rheumatology

## 2023-11-04 DIAGNOSIS — Z794 Long term (current) use of insulin: Secondary | ICD-10-CM

## 2023-11-04 DIAGNOSIS — Q667 Congenital pes cavus, unspecified foot: Secondary | ICD-10-CM

## 2023-11-04 DIAGNOSIS — Z803 Family history of malignant neoplasm of breast: Secondary | ICD-10-CM

## 2023-11-04 DIAGNOSIS — M65312 Trigger thumb, left thumb: Secondary | ICD-10-CM

## 2023-11-04 DIAGNOSIS — M19041 Primary osteoarthritis, right hand: Secondary | ICD-10-CM

## 2023-11-04 DIAGNOSIS — M65311 Trigger thumb, right thumb: Secondary | ICD-10-CM

## 2023-11-04 DIAGNOSIS — Z800 Family history of malignant neoplasm of digestive organs: Secondary | ICD-10-CM

## 2023-11-04 DIAGNOSIS — M47816 Spondylosis without myelopathy or radiculopathy, lumbar region: Secondary | ICD-10-CM

## 2023-11-04 DIAGNOSIS — M4310 Spondylolisthesis, site unspecified: Secondary | ICD-10-CM

## 2023-11-04 DIAGNOSIS — R768 Other specified abnormal immunological findings in serum: Secondary | ICD-10-CM

## 2023-11-04 DIAGNOSIS — M65331 Trigger finger, right middle finger: Secondary | ICD-10-CM

## 2023-11-04 DIAGNOSIS — J4521 Mild intermittent asthma with (acute) exacerbation: Secondary | ICD-10-CM

## 2023-11-04 DIAGNOSIS — N6091 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: Secondary | ICD-10-CM

## 2023-11-04 DIAGNOSIS — E559 Vitamin D deficiency, unspecified: Secondary | ICD-10-CM

## 2023-11-04 DIAGNOSIS — M170 Bilateral primary osteoarthritis of knee: Secondary | ICD-10-CM

## 2023-11-04 DIAGNOSIS — Z8639 Personal history of other endocrine, nutritional and metabolic disease: Secondary | ICD-10-CM

## 2023-11-04 DIAGNOSIS — G894 Chronic pain syndrome: Secondary | ICD-10-CM

## 2023-11-08 ENCOUNTER — Other Ambulatory Visit: Payer: Self-pay | Admitting: Internal Medicine

## 2023-11-11 ENCOUNTER — Inpatient Hospital Stay

## 2023-11-11 ENCOUNTER — Ambulatory Visit: Admitting: Internal Medicine

## 2023-11-11 ENCOUNTER — Inpatient Hospital Stay: Payer: Medicare Other | Attending: Adult Health | Admitting: Adult Health

## 2023-11-11 VITALS — BP 128/68 | HR 82 | Temp 98.0°F | Resp 16 | Wt 249.8 lb

## 2023-11-11 DIAGNOSIS — Z9189 Other specified personal risk factors, not elsewhere classified: Secondary | ICD-10-CM

## 2023-11-11 DIAGNOSIS — M810 Age-related osteoporosis without current pathological fracture: Secondary | ICD-10-CM | POA: Insufficient documentation

## 2023-11-11 DIAGNOSIS — N6091 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: Secondary | ICD-10-CM | POA: Diagnosis not present

## 2023-11-11 DIAGNOSIS — Z7182 Exercise counseling: Secondary | ICD-10-CM | POA: Diagnosis not present

## 2023-11-11 DIAGNOSIS — Z9223 Personal history of estrogen therapy: Secondary | ICD-10-CM | POA: Insufficient documentation

## 2023-11-11 DIAGNOSIS — E559 Vitamin D deficiency, unspecified: Secondary | ICD-10-CM

## 2023-11-11 DIAGNOSIS — Z713 Dietary counseling and surveillance: Secondary | ICD-10-CM | POA: Insufficient documentation

## 2023-11-11 DIAGNOSIS — Z853 Personal history of malignant neoplasm of breast: Secondary | ICD-10-CM | POA: Insufficient documentation

## 2023-11-11 DIAGNOSIS — E669 Obesity, unspecified: Secondary | ICD-10-CM | POA: Diagnosis not present

## 2023-11-11 DIAGNOSIS — Z170 Estrogen receptor positive status [ER+]: Secondary | ICD-10-CM | POA: Diagnosis not present

## 2023-11-11 LAB — VITAMIN D 25 HYDROXY (VIT D DEFICIENCY, FRACTURES): Vit D, 25-Hydroxy: 21.91 ng/mL — ABNORMAL LOW (ref 30–100)
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
## 2023-11-12 ENCOUNTER — Encounter: Payer: Self-pay | Admitting: Adult Health

## 2023-11-12 ENCOUNTER — Ambulatory Visit: Payer: Self-pay

## 2023-11-12 NOTE — Telephone Encounter (Addendum)
 Called patient to relay the results below as per Morna Kendall NP. Patients daughter voiced full understanding and had no further questions at this time.    ----- Message from Morna JAYSON Kendall sent at 11/12/2023  9:06 AM EDT ----- Patient vitamin d  level is low.  I recommend she take over the counter vitamin D3, 2000 units daily.  Would recommend recheck when she has repeat labs with her PCP ----- Message ----- From: Interface, Lab In [place] Sent: 11/11/2023   8:06 PM EDT To: Morna Dalton Kendall, NP

## 2023-11-13 ENCOUNTER — Other Ambulatory Visit: Payer: Self-pay | Admitting: Internal Medicine

## 2023-11-13 DIAGNOSIS — J301 Allergic rhinitis due to pollen: Secondary | ICD-10-CM
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
## 2023-11-15 ENCOUNTER — Other Ambulatory Visit: Payer: Self-pay | Admitting: Internal Medicine

## 2023-11-19 ENCOUNTER — Other Ambulatory Visit: Payer: Self-pay | Admitting: Internal Medicine

## 2023-11-26 ENCOUNTER — Ambulatory Visit: Attending: Rheumatology | Admitting: Rheumatology

## 2023-11-26 ENCOUNTER — Telehealth: Payer: Self-pay | Admitting: Pharmacist

## 2023-11-26 ENCOUNTER — Encounter: Payer: Self-pay | Admitting: Rheumatology

## 2023-11-26 VITALS — BP 134/80 | HR 75 | Temp 97.6°F | Resp 16 | Ht 65.0 in | Wt 251.2 lb

## 2023-11-26 DIAGNOSIS — J4521 Mild intermittent asthma with (acute) exacerbation: Secondary | ICD-10-CM

## 2023-11-26 DIAGNOSIS — E559 Vitamin D deficiency, unspecified: Secondary | ICD-10-CM | POA: Diagnosis present

## 2023-11-26 DIAGNOSIS — Z5181 Encounter for therapeutic drug level monitoring: Secondary | ICD-10-CM

## 2023-11-26 DIAGNOSIS — Q667 Congenital pes cavus, unspecified foot: Secondary | ICD-10-CM | POA: Diagnosis present

## 2023-11-26 DIAGNOSIS — R768 Other specified abnormal immunological findings in serum: Secondary | ICD-10-CM | POA: Diagnosis not present

## 2023-11-26 DIAGNOSIS — Z800 Family history of malignant neoplasm of digestive organs: Secondary | ICD-10-CM | POA: Diagnosis present

## 2023-11-26 DIAGNOSIS — Z794 Long term (current) use of insulin: Secondary | ICD-10-CM | POA: Diagnosis present

## 2023-11-26 DIAGNOSIS — Z96652 Presence of left artificial knee joint: Secondary | ICD-10-CM | POA: Diagnosis present

## 2023-11-26 DIAGNOSIS — G894 Chronic pain syndrome: Secondary | ICD-10-CM | POA: Diagnosis present

## 2023-11-26 DIAGNOSIS — M810 Age-related osteoporosis without current pathological fracture: Secondary | ICD-10-CM

## 2023-11-26 DIAGNOSIS — Z803 Family history of malignant neoplasm of breast: Secondary | ICD-10-CM | POA: Diagnosis present

## 2023-11-26 DIAGNOSIS — M65331 Trigger finger, right middle finger: Secondary | ICD-10-CM

## 2023-11-26 DIAGNOSIS — M19042 Primary osteoarthritis, left hand: Secondary | ICD-10-CM | POA: Diagnosis present

## 2023-11-26 DIAGNOSIS — E1165 Type 2 diabetes mellitus with hyperglycemia: Secondary | ICD-10-CM | POA: Diagnosis present

## 2023-11-26 DIAGNOSIS — N6091 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: Secondary | ICD-10-CM

## 2023-11-26 DIAGNOSIS — Z8639 Personal history of other endocrine, nutritional and metabolic disease: Secondary | ICD-10-CM

## 2023-11-26 DIAGNOSIS — M170 Bilateral primary osteoarthritis of knee: Secondary | ICD-10-CM | POA: Diagnosis not present

## 2023-11-26 DIAGNOSIS — M65312 Trigger thumb, left thumb: Secondary | ICD-10-CM

## 2023-11-26 DIAGNOSIS — M4310 Spondylolisthesis, site unspecified: Secondary | ICD-10-CM

## 2023-11-26 DIAGNOSIS — M19041 Primary osteoarthritis, right hand: Secondary | ICD-10-CM | POA: Diagnosis not present

## 2023-11-26 DIAGNOSIS — M65311 Trigger thumb, right thumb: Secondary | ICD-10-CM

## 2023-11-26 DIAGNOSIS — M47816 Spondylosis without myelopathy or radiculopathy, lumbar region: Secondary | ICD-10-CM | POA: Diagnosis present

## 2023-11-26 MED ORDER — VITAMIN D (ERGOCALCIFEROL) 1.25 MG (50000 UNIT) PO CAPS: 50000.0000 [IU] | ORAL_CAPSULE | ORAL | 0 refills | Status: DC

## 2023-11-26 NOTE — Progress Notes (Signed)
 Pharmacy Note  Subjective: Patient presents today to the [facility] for follow up office visit.  Patient seen by pharmacist for counseling on bisphosphonate therapy for osteoporosis.  Objective: T-score: -2.1 (on 10/14/2023)  Lab Results  Component Value Date   VD25OH 21.91 (L) 11/11/2023   CMP     Component Value Date/Time   NA 136 09/09/2023 0948   K 4.6 09/09/2023 0948   CL 99 09/09/2023 0948   CO2 29 09/09/2023 0948   GLUCOSE 162 (H) 09/09/2023 0948   BUN 19 09/09/2023 0948   CREATININE 0.85 09/09/2023 0948   CREATININE 0.78 10/15/2021 1310   CALCIUM  9.3 09/09/2023 0948   PROT 7.1 07/14/2023 1154   ALBUMIN 4.1 07/14/2023 1154   AST 16 07/14/2023 1154   AST 15 10/15/2021 1310   ALT 16 07/14/2023 1154   ALT 11 10/15/2021 1310   ALKPHOS 90 07/14/2023 1154   BILITOT 0.8 07/14/2023 1154   BILITOT 0.5 10/15/2021 1310   GFRNONAA >60 07/14/2023 1154   GFRNONAA >60 10/15/2021 1310   GFRAA >60 01/06/2019 1416     Assessment/Plan: Counseled patient that alendronate is an oral bisphosphonate that reduces bone turnover by inhibiting osteoclasts that chew up bone.  Counseled patient on purpose, proper use, and adverse effects of alendronate.  Reviewed with patient that alendronate should be taken once weekly.  Alendronate must be taking first thing in the morning, with a full glass of water, and she must wait an hour prior to eating food.  Also advised patient that she should not lie down until after she has eaten.  Provided patient with medication education material and answered all questions.  Reviewed adverse events of alendronate including risk of nausea & diarrhea, headache, and muscle & bone pain.  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  Patient confirms she does not have any major dental work scheduled at this time.  Patient agrees to trial of  alendronate at this time.     Reviewed importance of taking calcium  and vitamin D  with bisphosphonate therapy. Recommended daily amount of calcium  is 1200mg  and vitamin D  [phone] units.  Advised calcium  is better obtained through diet vs supplement.  Counseled about risk of excess calcium  supplementation such a kidney stones and increased risk of heart disease.  Advised to take levothyroxine  first thing in morning then wait 30 minutes before taking alendronate.  She is to notify our office is she finds that this is difficult to manage. She also states that she is having to  manage hypoglycemic events that are overnight. Advised that she should not take alendronate on these days.  Lorisa Scheid C. Cor[facility] PharmD Candidate Class of [phone]

## 2023-11-26 NOTE — Telephone Encounter (Signed)
 Pending baseline labs from today, patient will be alendronat enew start  Does: 70mg  po once weekly Take first thing in morning with one full cup of water. No foods, coffee, tea within hour of taking. Only water. Stay upright after taking.  Notify clinic if worsening of GERD/reflux symptoms.  Sherry Pennant, PharmD, MPH, BCPS, CPP Clinical Pharmacist [facility])

## 2023-11-26 NOTE — Patient Instructions (Signed)
 Osteopenia  Osteopenia is a loss of thickness (density) inside the bones. Another name for osteopenia is low bone mass. Mild osteopenia is a normal part of aging. It is not a disease, and it does not cause symptoms. However, if you have osteopenia and continue to lose bone mass, you could develop a condition that causes the bones to become thin and break more easily (osteoporosis). Osteoporosis can cause you to lose some height, have back pain, and have a stooped posture. Although osteopenia is not a disease, making changes to your lifestyle and diet can help to prevent osteopenia from developing into osteoporosis. What are the causes? Osteopenia is caused by loss of calcium  in the bones. Bones are constantly changing. Old bone cells are continually being replaced with new bone cells. This process builds new bone. The mineral calcium  is needed to build new bone and maintain bone density. Bone density is usually highest around age 51. After that, most people's bodies cannot replace all the bone they have lost with new bone. What increases the risk? You are more likely to develop this condition if: You are older than age 29. You are a woman who went through menopause early. You have a long illness that keeps you in bed. You do not get enough exercise. You lack certain nutrients (malnutrition). You have an overactive thyroid  gland (hyperthyroidism). You use products that contain nicotine or tobacco, such as cigarettes, e-cigarettes and chewing tobacco, or you drink a lot of alcohol. You are taking medicines that weaken the bones, such as steroids. What are the signs or symptoms? This condition does not cause any symptoms. You may have a slightly higher risk for bone breaks (fractures), so getting fractures more easily than normal may be an indication of osteopenia. How is this diagnosed? This condition may be diagnosed based on an X-ray exam that measures bone density (dual-energy X-ray  absorptiometry, or DEXA). This test can measure bone density in your hips, spine, and wrists. Osteopenia has no symptoms, so this condition is usually diagnosed after a routine bone density screening test is done for osteoporosis. This routine screening is usually done for: Women who are age 28 or older. Men who are age 63 or older. If you have risk factors for osteopenia, you may have the screening test at an earlier age. How is this treated? Making dietary and lifestyle changes can lower your risk for osteoporosis. If you have severe osteopenia that is close to becoming osteoporosis, this condition can be treated with medicines and dietary supplements such as calcium  and vitamin D . These supplements help to rebuild bone density. Follow these instructions at home: Eating and drinking Eat a diet that is high in calcium  and vitamin D . Calcium  is found in dairy products, beans, salmon, and leafy green vegetables like spinach and broccoli. Look for foods that have vitamin D  and calcium  added to them (fortified foods), such as orange juice, cereal, and bread.  Lifestyle Do 30 minutes or more of a weight-bearing exercise every day, such as walking, jogging, or playing a sport. These types of exercises strengthen the bones. Do not use any products that contain nicotine or tobacco, such as cigarettes, e-cigarettes, and chewing tobacco. If you need help quitting, ask your health care provider. Do not drink alcohol if: Your health care provider tells you not to drink. You are pregnant, may be pregnant, or are planning to become pregnant. If you drink alcohol: Limit how much you use to: 0-1 drink a day for women. 0-2  drinks a day for men. Be aware of how much alcohol is in your drink. In the [place] one drink equals one 12 oz bottle of beer (355 mL), one 5 oz glass of wine (148 mL), or one 1 oz glass of hard liquor (44 mL). General instructions Take over-the-counter and prescription medicines only as  told by your health care provider. These include vitamins and supplements. Take precautions at home to lower your risk of falling, such as: Keeping rooms well-lit and free of clutter, such as cords. Installing safety rails on stairs. Using rubber mats in the bathroom or other areas that are often wet or slippery. Keep all follow-up visits. This is important. Contact a health care provider if: You have not had a bone density screening for osteoporosis and you are: A woman who is age 48 or older. A man who is age 32 or older. You are a postmenopausal woman who has not had a bone density screening for osteoporosis. You are older than age 27 and you want to know if you should have bone density screening for osteoporosis. Summary Osteopenia is a loss of thickness (density) inside the bones. Another name for osteopenia is low bone mass. Osteopenia is not a disease, but it may increase your risk for a condition that causes the bones to become thin and break more easily (osteoporosis). You may be at risk for osteopenia if you are older than age 4 or if you are a woman who went through early menopause. Osteopenia does not cause any symptoms, but it can be diagnosed with a bone density screening test. Dietary and lifestyle changes are the first treatment for osteopenia. These may lower your risk for osteoporosis. This information is not intended to replace advice given to you by your health care provider. Make sure you discuss any questions you have with your health care provider. Document Revised: 11/18/2022 Document Reviewed: 11/18/2022  Alendronate Tablets What is this medication? ALENDRONATE (a LEN droe nate) prevents and treats osteoporosis. It may also be used to treat Paget disease of the bone. It works by Interior and spatial designer stronger and less likely to break (fracture). It belongs to a group of medications called bisphosphonates. This medicine may be used for other purposes; ask your health care  provider or pharmacist if you have questions. COMMON BRAND NAME(S): Fosamax What should I tell my care team before I take this medication? They need to know if you have any of these conditions: Bleeding disorder Cancer Dental disease Difficulty swallowing Infection (fever, chills, cough, sore throat, pain or trouble passing urine) Kidney disease Low levels of calcium  or other minerals in the blood Low red blood cell counts Receiving steroids like dexamethasone  or prednisone  Stomach or intestine problems Trouble sitting or standing for 30 minutes An unusual or allergic reaction to alendronate, other medications, foods, dyes or preservatives Pregnant or trying to get pregnant Breast-feeding How should I use this medication? Take this medication by mouth with a full glass of water. Take it as directed on the prescription label at the same time every day. Take the dose right after waking up. Do not eat or drink anything before taking it. Do not take it with any other drink except water. Do not chew or crush the tablet. After taking it, do not eat breakfast, drink, or take any other medications or vitamins for at least 30 minutes. Sit or stand up for at least 30 minutes after you take it. Do not lie down. Keep taking it unless  your care team tells you to stop. A special MedGuide will be given to you by the pharmacist with each prescription and refill. Be sure to read this information carefully each time. Talk to your care team about the use of this medication in children. Special care may be needed. Overdosage: If you think you have taken too much of this medicine contact a poison control center or emergency room at once. NOTE: This medicine is only for you. Do not share this medicine with others. What if I miss a dose? If you take your medication once a day, skip it. Take your next dose at the scheduled time the next morning. Do not take two doses on the same day. If you take your medication  once a week, take the missed dose on the morning after you remember. Do not take two doses on the same day. What may interact with this medication? Aluminum hydroxide Antacids Aspirin  Calcium  supplements Medications for inflammation like ibuprofen, naproxen, and others Iron supplements Magnesium  supplements Vitamins with minerals This list may not describe all possible interactions. Give your health care provider a list of all the medicines, herbs, non-prescription drugs, or dietary supplements you use. Also tell them if you smoke, drink alcohol, or use illegal drugs. Some items may interact with your medicine. What should I watch for while using this medication? Visit your care team for regular checks on your progress. It may be some time before you see the benefit from this medication. Some people who take this medication have severe bone, joint, or muscle pain. This medication may also increase your risk for jaw problems or a broken thigh bone. Tell your care team right away if you have severe pain in your jaw, bones, joints, or muscles. Tell you care team if you have any pain that does not go away or that gets worse. Tell your dentist and dental surgeon that you are taking this medication. You should not have major dental surgery while on this medication. See your dentist to have a dental exam and fix any dental problems before starting this medication. Take good care of your teeth while on this medication. Make sure you see your dentist for regular follow-up appointments. You should make sure you get enough calcium  and vitamin D  while you are taking this medication. Discuss the foods you eat and the vitamins you take with your care team. You may need blood work done while you are taking this medication. What side effects may I notice from receiving this medication? Side effects that you should report to your care team as soon as possible: Allergic reactions--skin rash, itching, hives,  swelling of the face, lips, tongue, or throat Low calcium  level--muscle pain or cramps, confusion, tingling, or numbness in the hands or feet Osteonecrosis of the jaw--pain, swelling, or redness in the mouth, numbness of the jaw, poor healing after dental work, unusual discharge from the mouth, visible bones in the mouth Pain or trouble swallowing Severe bone, joint, or muscle pain Stomach bleeding--bloody or black, tar-like stools, vomiting blood or brown material that looks like coffee grounds Side effects that usually do not require medical attention (report to your care team if they continue or are bothersome): Constipation Diarrhea Nausea Stomach pain This list may not describe all possible side effects. Call your doctor for medical advice about side effects. You may report side effects to FDA at 1-800-FDA-1088. Where should I keep my medication? Keep out of the reach of children and pets. Store at room  temperature between 15 and 30 degrees C (59 and 86 degrees F). Throw away any unused medication after the expiration date. NOTE: This sheet is a summary. It may not cover all possible information. If you have questions about this medicine, talk to your doctor, pharmacist, or health care provider.  2024 Elsevier/Gold Standard (2020-03-15 00:00:00) Elsevier Patient Education  [employer].

## 2023-11-27 ENCOUNTER — Ambulatory Visit: Payer: Self-pay | Admitting: Rheumatology

## 2023-11-27 NOTE — Progress Notes (Signed)
 TSH is high which represents hypothyroidism.  Please advise patient to contact her PCP to adjust her Synthroid  dose.  CMP is normal other labs are pending.

## 2023-12-01 MED ORDER — ALENDRONATE SODIUM 70 MG PO TABS: 70.0000 mg | ORAL_TABLET | ORAL | 2 refills | Status: DC

## 2023-12-03 LAB — PROTEIN ELECTROPHORESIS, SERUM, WITH REFLEX
Albumin ELP: 4.5 g/dL (ref 3.8–4.8)
Alpha 1: 0.4 g/dL — ABNORMAL HIGH (ref 0.2–0.3)
Alpha 2: 0.8 g/dL (ref 0.5–0.9)
Beta 2: 0.3 g/dL (ref 0.2–0.5)
Beta Globulin: 0.5 g/dL (ref 0.4–0.6)
Gamma Globulin: 0.7 g/dL — ABNORMAL LOW (ref 0.8–1.7)
Total Protein: 7.2 g/dL (ref 6.1–8.1)

## 2023-12-03 LAB — COMPREHENSIVE METABOLIC PANEL WITH GFR
AG Ratio: 1.6 (calc) (ref 1.0–2.5)
ALT: 12 U/L (ref 6–29)
AST: 16 U/L (ref 10–35)
Albumin: 4.3 g/dL (ref 3.6–5.1)
Alkaline phosphatase (APISO): 120 U/L (ref 37–153)
BUN: 18 mg/dL (ref 7–25)
CO2: 31 mmol/L (ref 20–32)
Calcium: 9.9 mg/dL (ref 8.6–10.4)
Chloride: 99 mmol/L (ref 98–110)
Creat: 0.79 mg/dL (ref 0.60–1.00)
Globulin: 2.7 g/dL (ref 1.9–3.7)
Glucose, Bld: 49 mg/dL — ABNORMAL LOW (ref 65–99)
Potassium: 4.8 mmol/L (ref 3.5–5.3)
Sodium: 138 mmol/L (ref 135–146)
Total Bilirubin: 0.4 mg/dL (ref 0.2–1.2)
Total Protein: 7 g/dL (ref 6.1–8.1)
eGFR: 78 mL/min/1.73m2 (ref >=60)

## 2023-12-03 LAB — IFE INTERPRETATION

## 2023-12-03 LAB — PHOSPHORUS: Phosphorus: 3.7 mg/dL (ref 2.1–4.3)

## 2023-12-03 LAB — TSH: TSH: 42.84 m[IU]/L — ABNORMAL HIGH (ref 0.40–4.50)

## 2023-12-03 LAB — PARATHYROID HORMONE, INTACT (NO CA): PTH: 36 pg/mL (ref 16–77)

## 2023-12-03 NOTE — Discharge Instructions (Signed)
 Instructions after Total Knee Replacement   Molly Wall, Jr., M.D.    Dept. of Orthopaedics & Sports Medicine [facility] [address] [place], [place]  [address]  Phone: [phone]   Fax: [phone]       [email or web address]       DIET: Drink plenty of non-alcoholic fluids. Resume your normal diet. Include foods high in fiber.  ACTIVITY:  You may use crutches or a walker with weight-bearing as tolerated, unless instructed otherwise. You may be weaned off of the walker or crutches by your Physical Therapist.  Do NOT place pillows under the knee. Anything placed under the knee could limit your ability to straighten the knee.   Use the Bone Foam 3 times a day for 30 minutes each session to help straighten the knee. Continue doing gentle exercises. Exercising will reduce the pain and swelling, increase motion, and prevent muscle weakness.   Please continue to use the TED compression stockings for 6 weeks. You may remove the stockings at night, but should reapply them in the morning. Do not drive or operate any equipment until instructed.  WOUND CARE:  The initial dressing (Aquacel) can remain in place for 7 days (see separate instructions). Continue to use the PolarCare or ice packs periodically to reduce pain and swelling. You may bathe or shower after the staples are removed at the first office visit following surgery.  MEDICATIONS: You may resume your regular medications. Please take the pain medication as prescribed on the medication. Do not take pain medication on an empty stomach. Unless instructed otherwise, you should take an enteric-coated aspirin  81 mg. TWICE a day. (This along with elevation will help reduce the possibility of blood clots/phlebitis in your operated leg.) Use a stool softener (such as Senokot-S or Colace) daily and a laxative (such as Miralax  or Dulcolax) as needed to prevent constipation.  Do not drive or drink alcoholic beverages when  taking pain medications.  CALL THE OFFICE FOR: Temperature above 101 degrees Excessive bleeding or drainage on the dressing. Excessive swelling, coldness, or paleness of the toes. Persistent nausea and vomiting.  FOLLOW-UP:  You should have an appointment to return to the office in 10-14 days after surgery. Arrangements have been made for continuation of Physical Therapy (either home therapy or outpatient therapy).     [facility] Department Directory         [email or web address]       [email or web address]          Cardiology  Appointments: [place] - [phone]  Endocrinology  Appointments: [place] [phone] [place] - [phone]  Gastroenterology  Appointments: [place] [phone] [place] - [phone]        General Surgery   Appointments: [facility]  Internal Medicine/Family Medicine  Appointments: [facility] - [phone] [place] - [phone]  Metabolic and Weigh Loss Surgery  Appointments: [facility]        Neurology  Appointments: [place] [phone] [place] - [phone]  Neurosurgery  Appointments: [place]  Obstetrics & Gynecology  Appointments: [place] [phone] [place] - [phone]        Pediatrics  Appointments: [place] [phone] [place] - [phone]  Physiatry  Appointments: [place] [phone]  Physical Therapy  Appointments: [place] - [phone]        Podiatry  Appointments: [place] [phone] [place] - [phone]  Pulmonology  Appointments: [place]  Rheumatology  Appointments: [place] [phone]  [place] Location: [facility]  [address] [place], [place]  [address]  [place] Location: [facility]. [address] [place], [place]  [address]  [place]  Location: [facility] [address] [place], [place]  [address]

## 2023-12-03 NOTE — Progress Notes (Signed)
 CMP shows glucose is low probably the time of the day when the sample was taken.  PTH normal, phosphorus normal, IFE normal.  Okay to send the prescription for Fosamax  70 mg p.o. weekly 30-day supply with 2 refills.

## 2023-12-04 ENCOUNTER — Other Ambulatory Visit: Payer: Self-pay | Admitting: *Deleted

## 2023-12-04 NOTE — Telephone Encounter (Signed)
/  Prescription was sent to the pharmacy on 12/01/2023

## 2023-12-04 NOTE — Telephone Encounter (Signed)
 A rx was sent on 09/16? Please check.

## 2023-12-05 ENCOUNTER — Other Ambulatory Visit: Payer: Self-pay | Admitting: Primary Care

## 2023-12-05 DIAGNOSIS — E785 Hyperlipidemia, unspecified: Secondary | ICD-10-CM

## 2023-12-07 ENCOUNTER — Other Ambulatory Visit: Payer: Self-pay

## 2023-12-07 ENCOUNTER — Encounter
Admission: RE | Admit: 2023-12-07 | Discharge: 2023-12-07 | Disposition: A | Discharge status: Home / Self Care | Source: Ambulatory Visit | Attending: Orthopedic Surgery | Admitting: Orthopedic Surgery

## 2023-12-07 VITALS — BP 135/93 | HR 73 | Resp 16 | Ht 65.0 in | Wt 253.0 lb

## 2023-12-07 DIAGNOSIS — E1165 Type 2 diabetes mellitus with hyperglycemia: Secondary | ICD-10-CM | POA: Insufficient documentation

## 2023-12-07 DIAGNOSIS — R112 Nausea with vomiting, unspecified: Secondary | ICD-10-CM | POA: Diagnosis not present

## 2023-12-07 DIAGNOSIS — J209 Acute bronchitis, unspecified: Secondary | ICD-10-CM | POA: Insufficient documentation

## 2023-12-07 DIAGNOSIS — Z794 Long term (current) use of insulin: Secondary | ICD-10-CM | POA: Insufficient documentation

## 2023-12-07 DIAGNOSIS — Z01812 Encounter for preprocedural laboratory examination: Secondary | ICD-10-CM | POA: Insufficient documentation

## 2023-12-07 DIAGNOSIS — M1711 Unilateral primary osteoarthritis, right knee: Secondary | ICD-10-CM | POA: Diagnosis not present

## 2023-12-07 DIAGNOSIS — E039 Hypothyroidism, unspecified: Secondary | ICD-10-CM | POA: Diagnosis not present

## 2023-12-07 DIAGNOSIS — Z01818 Encounter for other preprocedural examination: Secondary | ICD-10-CM

## 2023-12-07 DIAGNOSIS — R42 Dizziness and giddiness: Secondary | ICD-10-CM | POA: Diagnosis not present

## 2023-12-07 LAB — URINALYSIS, ROUTINE W REFLEX MICROSCOPIC
Bilirubin Urine: NEGATIVE
Glucose, UA: NEGATIVE mg/dL
Hgb urine dipstick: NEGATIVE
Ketones, ur: 5 mg/dL — AB
Leukocytes,Ua: NEGATIVE
Nitrite: NEGATIVE
Protein, ur: NEGATIVE mg/dL
Specific Gravity, Urine: 1.008 (ref 1.005–1.030)
pH: 6 (ref 5.0–8.0)

## 2023-12-07 LAB — HEMOGLOBIN AND HEMATOCRIT, BLOOD
HCT: 40.8 % (ref 36.0–46.0)
Hemoglobin: 12.8 g/dL (ref 12.0–15.0)

## 2023-12-07 LAB — SEDIMENTATION RATE: Sed Rate: 11 mm/h (ref 0–30)

## 2023-12-07 LAB — COMPREHENSIVE METABOLIC PANEL WITH GFR
ALT: 15 U/L (ref 0–44)
AST: 21 U/L (ref 15–41)
Albumin: 3.9 g/dL (ref 3.5–5.0)
Alkaline Phosphatase: 94 U/L (ref 38–126)
Anion gap: 10 (ref 5–15)
BUN: 19 mg/dL (ref 8–23)
CO2: 26 mmol/L (ref 22–32)
Calcium: 9 mg/dL (ref 8.9–10.3)
Chloride: 98 mmol/L (ref 98–111)
Creatinine, Ser: 0.78 mg/dL (ref 0.44–1.00)
GFR, Estimated: >60 mL/min (ref >60)
Glucose, Bld: 199 mg/dL — ABNORMAL HIGH (ref 70–99)
Potassium: 4.2 mmol/L (ref 3.5–5.1)
Sodium: 134 mmol/L — ABNORMAL LOW (ref 135–145)
Total Bilirubin: 0.7 mg/dL (ref 0.0–1.2)
Total Protein: 7.1 g/dL (ref 6.5–8.1)

## 2023-12-07 LAB — SURGICAL PCR SCREEN
MRSA, PCR: NEGATIVE
Staphylococcus aureus: NEGATIVE

## 2023-12-07 LAB — HEMOGLOBIN A1C
Hgb A1c MFr Bld: 6.9 % — ABNORMAL HIGH (ref 4.8–5.6)
Mean Plasma Glucose: 151.33 mg/dL

## 2023-12-07 LAB — C-REACTIVE PROTEIN: CRP: 0.5 mg/dL (ref <1.0)

## 2023-12-07 NOTE — Patient Instructions (Addendum)
 Your procedure is scheduled on: Monday 12/14/23 Report to the Registration Desk on the 1st floor of the [facility]. To find out your arrival time, please call [phone] between 1PM - 3PM on: Friday 12/11/23 If your arrival time is 6:00 am, do not arrive before that time as the [facility] entrance doors do not open until 6:00 am.  REMEMBER: Instructions that are not followed completely may result in serious medical risk, up to and including death; or upon the discretion of your surgeon and anesthesiologist your surgery may need to be rescheduled.  Do not eat food after midnight the night before surgery.  No gum chewing or hard candies.  You may however, drink CLEAR liquids up to 2 hours before you are scheduled to arrive for your surgery. Do not drink anything within 2 hours of your scheduled arrival time.  Clear liquids include: - water  **Type 1 and Type 2 diabetics should only drink water.**  In addition, your doctor has ordered for you to drink the provided:  Gatorade G2 Drinking this carbohydrate drink up to two hours before surgery helps to reduce insulin  resistance and improve patient outcomes. Please complete drinking 2 hours before scheduled arrival time.  One week prior to surgery: Stop Anti-inflammatories (NSAIDS) such as Advil, Aleve, Ibuprofen, Motrin, Naproxen, Naprosyn and Aspirin  based products such as Excedrin, Goody's Powder, BC Powder.  You may however, continue to take Tylenol  if needed for pain up until the day of surgery. (Stay at or below 3000 mg per day or 5-6 tabs a day depending on dose taken ie: 500 mg tab vs 650 mg tab)  Stop ANY OVER THE COUNTER supplements and vitamins until after surgery.  **Follow guidelines for insulin  and diabetes medications.** You will need to contact your endocrinologist for guidelines related to your insulin  pump.   **Follow recommendations regarding stopping blood thinners.** Check with Dr Hooten about stopping  aspirin   Continue taking all of your other prescription medications up until the day of surgery. Metformin  hold for 2 days, last dose Friday 12/11/23.  ON THE DAY OF SURGERY ONLY TAKE THESE MEDICATIONS WITH SIPS OF WATER:  Atorvastatin  Levothyroxine    Use inhalers on the day of surgery and bring to the hospital.  No Alcohol for 24 hours before or after surgery.  No Smoking including e-cigarettes for 24 hours before surgery.  No chewable tobacco products for at least 6 hours before surgery.  No nicotine patches on the day of surgery.  Do not use any recreational drugs for at least a week (preferably 2 weeks) before your surgery.  Please be advised that the combination of cocaine and anesthesia may have negative outcomes, up to and including death. If you test positive for cocaine, your surgery will be cancelled.  On the morning of surgery brush your teeth with toothpaste and water, you may rinse your mouth with mouthwash if you wish. Do not swallow any toothpaste or mouthwash.  Use CHG Soap or wipes as directed on instruction sheet.  Do not shave body hair from the neck down 48 hours before surgery.  Do not wear lotions, powders, or perfumes on the day of surgery.  Wear comfortable clothing (specific to your surgery type) to the hospital.  Do not wear jewelry, make-up, hairpins, clips or nail polish.  For welded (permanent) jewelry: bracelets, anklets, waist bands, etc.  Please have this removed prior to surgery.  If it is not removed, there is a chance that hospital personnel will need to cut  it off on the day of surgery.  Contact lenses, hearing aids and dentures may not be worn into surgery.  Do not bring valuables to the hospital. [facility] is not responsible for any missing/lost belongings or valuables.   Bring your C-PAP to the hospital in case you may have to spend the night.   Notify your doctor if there is any change in your medical condition (cold, fever,  infection).  After surgery, you can help prevent lung complications by doing breathing exercises.  Take deep breaths and cough every 1-2 hours. Your doctor may order a device called an Incentive Spirometer to help you take deep breaths.  If you are being admitted to the hospital overnight, leave your suitcase in the car. After surgery it may be brought to your room.  In case of increased patient census, it may be necessary for you, the patient, to continue your postoperative care in the Same Day Surgery department.  ease call the Pre-admissions Testing Dept. at [phone] if you have any questions about these instructions.  Surgery Visitation Policy:  Patients having surgery or a procedure may have two visitors.  Children under the age of 13 must have an adult with them who is not the patient.  Inpatient Visitation:    Visiting hours are 7 a.m. to 8 p.m. Up to four visitors are allowed at one time in a patient room. The visitors may rotate out with other people during the day.  One visitor age 28 or older may stay with the patient overnight and must be in the room by 8 p.m.   Merchandiser, retail to address health-related social needs:  [email or web address]  How to Use an Incentive Spirometer  An incentive spirometer is a tool that measures how well you are filling your lungs with each breath. Learning to take long, deep breaths using this tool can help you keep your lungs clear and active. This may help to reverse or lessen your chance of developing breathing (pulmonary) problems, especially infection. You may be asked to use a spirometer: After a surgery. If you have a lung problem or a history of smoking. After a long period of time when you have been unable to move or be active. If the spirometer includes an indicator to show the highest number that you have reached, your health care provider or respiratory therapist will help you set a goal. Keep a log of  your progress as told by your health care provider. What are the risks? Breathing too quickly may cause dizziness or cause you to pass out. Take your time so you do not get dizzy or light-headed. If you are in pain, you may need to take pain medicine before doing incentive spirometry. It is harder to take a deep breath if you are having pain. How to use your incentive spirometer  Sit up on the edge of your bed or on a chair. Hold the incentive spirometer so that it is in an upright position. Before you use the spirometer, breathe out normally. Place the mouthpiece in your mouth. Make sure your lips are closed tightly around it. Breathe in slowly and as deeply as you can through your mouth, causing the piston or the ball to rise toward the top of the chamber. Hold your breath for 3-5 seconds, or for as long as possible. If the spirometer includes a coach indicator, use this to guide you in breathing. Slow down your breathing if the indicator goes above the marked areas.  Remove the mouthpiece from your mouth and breathe out normally. The piston or ball will return to the bottom of the chamber. Rest for a few seconds, then repeat the steps 10 or more times. Take your time and take a few normal breaths between deep breaths so that you do not get dizzy or light-headed. Do this every 1-2 hours when you are awake. If the spirometer includes a goal marker to show the highest number you have reached (best effort), use this as a goal to work toward during each repetition. After each set of 10 deep breaths, cough a few times. This will help to make sure that your lungs are clear. If you have an incision on your chest or abdomen from surgery, place a pillow or a rolled-up towel firmly against the incision when you cough. This can help to reduce pain while taking deep breaths and coughing. General tips When you are able to get out of bed: Walk around often. Continue to take deep breaths and cough in order  to clear your lungs. Keep using the incentive spirometer until your health care provider says it is okay to stop using it. If you have been in the hospital, you may be told to keep using the spirometer at home. Contact a health care provider if: You are having difficulty using the spirometer. You have trouble using the spirometer as often as instructed. Your pain medicine is not giving enough relief for you to use the spirometer as told. You have a fever. Get help right away if: You develop shortness of breath. You develop a cough with bloody mucus from the lungs. You have fluid or blood coming from an incision site after you cough. Summary An incentive spirometer is a tool that can help you learn to take long, deep breaths to keep your lungs clear and active. You may be asked to use a spirometer after a surgery, if you have a lung problem or a history of smoking, or if you have been inactive for a long period of time. Use your incentive spirometer as instructed every 1-2 hours while you are awake. If you have an incision on your chest or abdomen, place a pillow or a rolled-up towel firmly against your incision when you cough. This will help to reduce pain. Get help right away if you have shortness of breath, you cough up bloody mucus, or blood comes from your incision when you cough. This information is not intended to replace advice given to you by your health care provider. Make sure you discuss any questions you have with your health care provider. Document Revised: 05/23/2019 Document Reviewed: 05/23/2019 Elsevier Patient Education  2023 [employer]    Pre-operative 5 CHG Bath Instructions   You can play a key role in reducing the risk of infection after surgery. Your skin needs to be as free of germs as possible. You can reduce the number of germs on your skin by washing with CHG (chlorhexidine  gluconate) soap before surgery. CHG is an antiseptic soap that kills germs and continues  to kill germs even after washing.   DO NOT use if you have an allergy to chlorhexidine /CHG or antibacterial soaps. If your skin becomes reddened or irritated, stop using the CHG and notify one of our RNs at [phone].   Please shower with the CHG soap starting 4 days before surgery using the following schedule:   Thursday 12/10/23 - Monday 12/14/23    Please keep in mind the following:  DO  NOT shave, including legs and underarms, starting the day of your first shower.   You may shave your face at any point before/day of surgery.  Place clean sheets on your bed the day you start using CHG soap. Use a clean washcloth (not used since being washed) for each shower. DO NOT sleep with pets once you start using the CHG.   CHG Shower Instructions:  If you choose to wash your hair and private area, wash first with your normal shampoo/soap.  After you use shampoo/soap, rinse your hair and body thoroughly to remove shampoo/soap residue.  Turn the water OFF and apply about 3 tablespoons (45 ml) of CHG soap to a CLEAN washcloth.  Apply CHG soap ONLY FROM YOUR NECK DOWN TO YOUR TOES (washing for 3-5 minutes)  DO NOT use CHG soap on face, private areas, open wounds, or sores.  Pay special attention to the area where your surgery is being performed.  If you are having back surgery, having someone wash your back for you may be helpful. Wait 2 minutes after CHG soap is applied, then you may rinse off the CHG soap.  Pat dry with a clean towel  Put on clean clothes/pajamas   If you choose to wear lotion, please use ONLY the CHG-compatible lotions on the back of this paper.     Additional instructions for the day of surgery: DO NOT APPLY any lotions, deodorants, cologne, or perfumes.   Put on clean/comfortable clothes.  Brush your teeth.  Ask your nurse before applying any prescription medications to the skin.      CHG Compatible Lotions   Aveeno Moisturizing lotion  Cetaphil Moisturizing  Cream  Cetaphil Moisturizing Lotion  Clairol Herbal Essence Moisturizing Lotion, Dry Skin  Clairol Herbal Essence Moisturizing Lotion, Extra Dry Skin  Clairol Herbal Essence Moisturizing Lotion, Normal Skin  Curel Age Defying Therapeutic Moisturizing Lotion with Alpha Hydroxy  Curel Extreme Care Body Lotion  Curel Soothing Hands Moisturizing Hand Lotion  Curel Therapeutic Moisturizing Cream, Fragrance-Free  Curel Therapeutic Moisturizing Lotion, Fragrance-Free  Curel Therapeutic Moisturizing Lotion, Original Formula  Eucerin Daily Replenishing Lotion  Eucerin Dry Skin Therapy Plus Alpha Hydroxy Crme  Eucerin Dry Skin Therapy Plus Alpha Hydroxy Lotion  Eucerin Original Crme  Eucerin Original Lotion  Eucerin Plus Crme Eucerin Plus Lotion  Eucerin TriLipid Replenishing Lotion  Keri Anti-Bacterial Hand Lotion  Keri Deep Conditioning Original Lotion Dry Skin Formula Softly Scented  Keri Deep Conditioning Original Lotion, Fragrance Free Sensitive Skin Formula  Keri Lotion Fast Absorbing Fragrance Free Sensitive Skin Formula  Keri Lotion Fast Absorbing Softly Scented Dry Skin Formula  Keri Original Lotion  Keri Skin Renewal Lotion Keri Silky Smooth Lotion  Keri Silky Smooth Sensitive Skin Lotion  Nivea Body Creamy Conditioning Oil  Nivea Body Extra Enriched Teacher, adult education Moisturizing Lotion Nivea Crme  Nivea Skin Firming Lotion  NutraDerm 30 Skin Lotion  NutraDerm Skin Lotion  NutraDerm Therapeutic Skin Cream  NutraDerm Therapeutic Skin Lotion  ProShield Protective Hand Cream  Provon moisturizing lotion

## 2023-12-08 ENCOUNTER — Ambulatory Visit: Attending: Sleep Medicine

## 2023-12-08 ENCOUNTER — Ambulatory Visit (INDEPENDENT_AMBULATORY_CARE_PROVIDER_SITE_OTHER): Admitting: Primary Care

## 2023-12-08 ENCOUNTER — Encounter: Payer: Self-pay | Admitting: Primary Care

## 2023-12-08 VITALS — BP 124/80 | HR 82 | Temp 97.8°F | Ht 65.0 in | Wt 254.0 lb

## 2023-12-08 DIAGNOSIS — E039 Hypothyroidism, unspecified: Secondary | ICD-10-CM

## 2023-12-08 DIAGNOSIS — J4520 Mild intermittent asthma, uncomplicated: Secondary | ICD-10-CM | POA: Diagnosis not present

## 2023-12-08 DIAGNOSIS — E1165 Type 2 diabetes mellitus with hyperglycemia: Secondary | ICD-10-CM

## 2023-12-08 DIAGNOSIS — E785 Hyperlipidemia, unspecified: Secondary | ICD-10-CM

## 2023-12-08 DIAGNOSIS — Z794 Long term (current) use of insulin: Secondary | ICD-10-CM

## 2023-12-08 DIAGNOSIS — G4736 Sleep related hypoventilation in conditions classified elsewhere: Secondary | ICD-10-CM | POA: Insufficient documentation

## 2023-12-08 DIAGNOSIS — J302 Other seasonal allergic rhinitis: Secondary | ICD-10-CM

## 2023-12-08 DIAGNOSIS — G4733 Obstructive sleep apnea (adult) (pediatric): Secondary | ICD-10-CM | POA: Insufficient documentation

## 2023-12-08 DIAGNOSIS — G478 Other sleep disorders: Secondary | ICD-10-CM | POA: Insufficient documentation

## 2023-12-08 NOTE — Assessment & Plan Note (Signed)
 Repeat lipid panel pending. Continue atorvastatin 20 mg daily.

## 2023-12-08 NOTE — Assessment & Plan Note (Addendum)
 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 Following with endocrinology   Continue Novolog  pump PRN, up to 70 units. Continue Humalog  pump with meals.  Resume metoformin ER 1000 mg daily and Ozempic  1 mg weekly after surgery.

## 2023-12-08 NOTE — Patient Instructions (Signed)
 Schedule a lab only appointment for 2 months to recheck thyroid  and cholesterol.  Make sure you have fasted 4 hours.  Be sure to take your levothyroxine  (thyroid  medication) every morning on an empty stomach with water only.   No food or other medications for 30 minutes.   No heartburn medication, iron pills, calcium , vitamin D , or magnesium  pills within four hours of taking levothyroxine .   It was a pleasure to see you today!

## 2023-12-08 NOTE — Assessment & Plan Note (Addendum)
 She admits to inconsistency with levothyroxine  recently.   Will have her resume levothyroxine  88 mcg 6 days weekly and 132 mcg once weekly. We discussed proper administration.   Repeat TSH in 2 months.

## 2023-12-08 NOTE — Assessment & Plan Note (Signed)
 Stable.  Continue Singulair  10 mg HS, Astelin  0.1% nasal spray, ipratropium 0.03% nasal spray.

## 2023-12-08 NOTE — Progress Notes (Signed)
 Subjective:    Patient ID: Molly Wall, female    DOB: 10-08-1945, 78 y.o.   MRN: [id number]  Molly Wall is a very pleasant 78 y.o. female with a history of asthma, OSA, type 2 diabetes, hypothyroidism, chronic back pain, osteoarthritis who presents today for follow-up of chronic conditions.  1) Asthma/Allergies: Currently managed on montelukast  10 mg at bedtime, Wixela 250-50 mcg, ipratropium 0.03% nasal spray, Astelin  0.1% nasal spray, albuterol  inhaler as needed.  2) Hyperlipidemia: Currently managed on Lipitor 20 mg daily, aspirin  81 mg daily.  3) Type 2 Diabetes: Following with endocrinology and is managed on metformin  ER 1000 mg daily, Ozempic  1 mg weekly, NovoLog  up to 70 units via pump, Humalog  pump TID with meals.  Her most recent A1c was 6.9 as of yesterday, increased from 6.7 in April 2025. She Is currently not on Ozempic  and metformin  but plans to resume after her surgery.   4) Hypothyroidism: Currently managed on levothyroxine  88 mcg daily.  Her last TSH was drawn 11/26/2023 with result of 42.84.   Review of Systems  Respiratory:  Negative for shortness of breath.   Cardiovascular:  Negative for chest pain.  Gastrointestinal:  Negative for constipation and diarrhea.  Musculoskeletal:  Positive for arthralgias.  Neurological:  Negative for headaches.         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   Breast cancer (HCC)    Diabetes mellitus without complication (HCC)    Family history of breast cancer    Family history of colon cancer    Hypercholesteremia    Hypothyroidism    Lumbar herniated disc    4-5th lumbar   Nausea and vomiting 09/09/2023   OSA (obstructive sleep apnea)    used to use CPAP, not any more over 1 1/2 yrs   Osteoarthritis    Salivary stone     left side   Sleep apnea    no cpap in 2 yrs    Vitamin D  deficiency     Social History   Socioeconomic History   Marital status: Divorced    Spouse name: Not on file   Number of children: 2   Years of education: Not on file   Highest education level: Master's degree (e.g., MA, MS, MEng, MEd, MSW, MBA)  Occupational History   Not on file  Tobacco Use   Smoking status: Never    Passive exposure: Never   Smokeless tobacco: Never  Vaping Use   Vaping status: Never Used  Substance and Sexual Activity   Alcohol use: Not Currently   Drug use: No   Sexual activity: Not Currently  Other Topics Concern   Not on file  Social History Narrative   Single.   Moved from [place]  to [place] several weeks ago.   Family lives in [place].   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   Social Drivers of Corporate investment banker Strain: Low Risk  (12/07/2023)   Overall Financial Resource Strain (CARDIA)    Difficulty of Paying Living Expenses: Not hard at all  Food Insecurity: No Food Insecurity (12/07/2023)   Hunger Vital Sign    Worried About Running Out of Food in  the Last Year: Never true    Ran Out of Food in the Last Year: Never true  Transportation Needs: No Transportation Needs (12/07/2023)   PRAPARE - Administrator, Civil Service (Medical): No    Lack of Transportation (Non-Medical): No  Physical Activity: Insufficiently Active (12/07/2023)   Exercise Vital Sign    Days of Exercise per Week: 3 days    Minutes of Exercise per Session: 30 min  Stress: No Stress Concern Present (12/07/2023)   [employer] of Occupational Health - Occupational Stress Questionnaire    Feeling of Stress: Not at all  Social Connections: Moderately Integrated (12/07/2023)   Social Connection and Isolation Panel    Frequency of Communication with Friends and Family: More than three times a week     Frequency of Social Gatherings with Friends and Family: Twice a week    Attends Religious Services: More than 4 times per year    Active Member of [employer] or Organizations: Yes    Attends Engineer, structural: More than 4 times per year    Marital Status: Divorced  Intimate Partner Violence: Not At Risk (10/29/2023)   Humiliation, Afraid, Rape, and Kick questionnaire    Fear of Current or Ex-Partner: No    Emotionally Abused: No    Physically Abused: No    Sexually Abused: No    Past Surgical History:  Procedure Laterality Date   APPENDECTOMY     BREAST BIOPSY Left 07/10/2023   MM LT BREAST BX W LOC DEV 1ST LESION IMAGE BX SPEC STEREO GUIDE 07/10/2023 [facility]   BREAST LUMPECTOMY Right    per pt - precancerous   BREAST LUMPECTOMY WITH RADIOACTIVE SEED LOCALIZATION Right 08/12/2017   Procedure: RIGHT BREAST LUMPECTOMY X'S 2 WITH RADIOACTIVE SEED LOCALIZATION X'S 2;  Surgeon: Gail Favorite, MD;  Location: [facility];  Service: General;  Laterality: Right;   [place]     [place]  - [place] but given a 10 yr recall per pt    GANGLION CYST EXCISION     78 years old   INCISION AND DRAINAGE ABSCESS ANAL     KNEE ARTHROPLASTY Left 07/20/2023   Procedure: ARTHROPLASTY, KNEE, TOTAL, USING IMAGELESS COMPUTER-ASSISTED NAVIGATION;  Surgeon: Mardee Lynwood SQUIBB, MD;  Location: [facility];  Service: Orthopedics;  Laterality: Left;   KNEE SURGERY  2014   SHOULDER SURGERY  1976   TONSILLECTOMY     age 79    Family History  Problem Relation Age of Onset   Alzheimer's disease Mother    Asthma Mother    Dementia Mother        cause of death   Arthritis Father    Heart attack Father 10       cause of death   Breast cancer Sister 71   Healthy Sister    Breast cancer Daughter 24   Healthy Daughter    Healthy Daughter    Liver cancer Maternal Uncle    Colon cancer Paternal Grandmother    Healthy Brother    Colon polyps Neg Hx    Rectal cancer Neg  Hx    Stomach cancer Neg Hx    Esophageal cancer Neg Hx     Allergies  Allergen Reactions   Iodine-131 Anaphylaxis    Pt states when she was 21=she drank an experimental Iodinated Radioisotope for her thyroid . She described it was a small amount in a milk carton in [place] . She states they stopped using  it shortly after because the trial wasn't successful. SPM   Prednisone      Make sugar jump way up.    Current Outpatient Medications on File Prior to Visit  Medication Sig Dispense Refill   acetaminophen  (TYLENOL ) 650 MG CR tablet Take 1-2 tablets by mouth 3 (three) times daily as needed for pain.     albuterol  (VENTOLIN  HFA) 108 (90 Base) MCG/ACT inhaler TAKE 2 PUFFS BY MOUTH EVERY 6 HOURS AS NEEDED FOR WHEEZE/SHORTNESS OF BREATH 8.5 each 0   alendronate  (FOSAMAX ) 70 MG tablet Take 1 tablet (70 mg total) by mouth once a week. Take with a full glass of water on an empty stomach. 4 tablet 2   aspirin  81 MG chewable tablet Chew 1 tablet (81 mg total) by mouth 2 (two) times daily.     atorvastatin  (LIPITOR) 20 MG tablet TAKE 1 TABLET BY MOUTH EVERY DAY FOR CHOLESTEROL 90 tablet 0   azelastine  (ASTELIN ) 0.1 % nasal spray Place 2 sprays into both nostrils at bedtime. Use in each nostril as directed     celecoxib  (CELEBREX ) 200 MG capsule Take 1 capsule (200 mg total) by mouth 2 (two) times daily. 60 capsule 1   Continuous Glucose Sensor (DEXCOM G7 SENSOR) MISC Use to check glucose continuously,change every 10 days Dx Code: E11.65 9 each 4   fluticasone  (FLONASE ) 50 MCG/ACT nasal spray INSTILL 1 SPRAY IN EACH NOSTRIL TWICE A DAY AS NEEDED FOR ALLERGIES OR RHINITIS 48 mL 0   Glucagon  3 MG/DOSE POWD Place 3 mg into the nose once as needed for up to 1 dose. 1 each 11   Insulin  Disposable Pump (V-GO 30) 30 UNIT/24HR KIT USE AS DIRECTED 30 kit 5   insulin  lispro (HUMALOG  KWIKPEN) 100 UNIT/ML KwikPen INJECT 25 UNITS INTO THE SKIN 3 (THREE) TIMES DAILY WITH MEALS 15 mL PRN   Insulin  Pen Needle (BD  PEN NEEDLE NANO 2ND GEN) 32G X 4 MM MISC USE AS DIRECTED 3 TIMES A DAY 300 each 5   ipratropium (ATROVENT ) 0.03 % nasal spray PLACE 2 SPRAYS INTO THE NOSE 4 (FOUR) TIMES DAILY. 90 mL 2   levothyroxine  (SYNTHROID ) 88 MCG tablet TAKE 1 AND 1/2 TABLET BY MOUTH EVERY SUNDAY. TAKE 1 TABLET BY MOUTH MON THROUGH SAT. TAKE ON AN EMPTY STOMACH WITH WATER ONLY. NO FOOD OR OTHER MEDICATIONS FOR 30 MINUTES. 96 tablet 0   montelukast  (SINGULAIR ) 10 MG tablet TAKE 1 TAB BY MOUTH AT BEDTIME FOR ALLERGIES AND ASTHMA. 90 tablet 0   NOVOLOG  100 UNIT/ML injection INJECT UP TO 70 UNITS INTO THE SKIN ONCE FOR 1 DOSE. VIA PUMP 70 mL 5   ONETOUCH VERIO test strip TEST BLOOD SUGAR UP TO THREE TIMES DAILY FOR DIABETES. 300 strip 1   Vitamin D , Ergocalciferol , (DRISDOL ) 1.25 MG (50000 UNIT) CAPS capsule Take 1 capsule (50,000 Units total) by mouth every 7 (seven) days. 12 capsule 0   WIXELA INHUB 250-50 MCG/ACT AEPB INHALE 1 PUFF BY MOUTH TWICE A DAY 60 each 9   metFORMIN  (GLUCOPHAGE -XR) 500 MG 24 hr tablet TAKE 2 TABLETS (1,000 MG TOTAL) BY MOUTH DAILY WITH BREAKFAST. FOR DIABETES. (Patient not taking: Reported on 12/08/2023) 180 tablet 1   Probiotic Product (PROBIOTIC PO) Take 1 capsule by mouth daily. (Patient not taking: Reported on 12/08/2023)     Semaglutide , 1 MG/DOSE, (OZEMPIC , 1 MG/DOSE,) 4 MG/3ML SOPN INJECT 1 MG INTO THE SKIN ONE TIME PER WEEK (Patient not taking: Reported on 12/08/2023) 3 mL 11   TURMERIC  PO Take by mouth. (Patient not taking: Reported on 12/08/2023)     No current facility-administered medications on file prior to visit.    BP 124/80   Pulse 82   Temp 97.8 F (36.6 C) (Temporal)   Ht 5' 5 (1.651 m)   Wt 254 lb (115.2 kg)   SpO2 98%   BMI 42.27 kg/m  Objective:   Physical Exam Cardiovascular:     Rate and Rhythm: Normal rate and regular rhythm.  Pulmonary:     Effort: Pulmonary effort is normal.     Breath sounds: Normal breath sounds.  Musculoskeletal:     Cervical back: Neck  supple.  Skin:    General: Skin is warm and dry.  Neurological:     Mental Status: She is alert and oriented to person, place, and time.  Psychiatric:        Mood and Affect: Mood normal.     Physical Exam        Assessment & Plan:  Seasonal allergic rhinitis, unspecified trigger Assessment & Plan: Stable.  Continue Singulair  10 mg HS, Astelin  0.1% nasal spray, ipratropium 0.03% nasal spray.    Mild intermittent asthma without complication Assessment & Plan: Stable.  Continue Wixela 250-50 mcg, 1 puff BID, albuterol  inhaler PRN, Singulair  10 mg HS   Type 2 diabetes mellitus with hyperglycemia, with long-term current use of insulin  (HCC) Assessment & Plan: Stable with A1C of 6.9. Following with endocrinology   Continue Novolog  pump PRN, up to 70 units. Continue Humalog  pump with meals.  Resume metoformin ER 1000 mg daily and Ozempic  1 mg weekly after surgery.     Hypothyroidism, unspecified type Assessment & Plan: She admits to inconsistency with levothyroxine  recently.   Will have her resume levothyroxine  88 mcg 6 days weekly and 132 mcg once weekly. We discussed proper administration.   Repeat TSH in 2 months.    Orders: -     TSH; Future  Hyperlipidemia, unspecified hyperlipidemia type Assessment & Plan: Repeat lipid panel pending.  Continue atorvastatin  20 mg daily  Orders: -     Lipid panel; Future   Mammogram and bone density scan are up-to-date. Declines influenza vaccine today. Colonoscopy no longer needed given age.  Assessment and Plan Assessment & Plan         Comer MARLA Gaskins, NP      History of Present Illness

## 2023-12-08 NOTE — Assessment & Plan Note (Signed)
 Stable.  Continue Wixela 250-50 mcg, 1 puff BID, albuterol  inhaler PRN, Singulair  10 mg HS

## 2023-12-09 ENCOUNTER — Ambulatory Visit: Payer: Self-pay

## 2023-12-09 ENCOUNTER — Ambulatory Visit: Admitting: Nurse Practitioner

## 2023-12-09 VITALS — BP 126/80 | HR 76 | Temp 97.8°F | Ht 65.0 in | Wt 254.8 lb

## 2023-12-09 DIAGNOSIS — R0902 Hypoxemia: Secondary | ICD-10-CM | POA: Diagnosis not present

## 2023-12-09 DIAGNOSIS — S40861A Insect bite (nonvenomous) of right upper arm, initial encounter: Secondary | ICD-10-CM

## 2023-12-09 DIAGNOSIS — G4736 Sleep related hypoventilation in conditions classified elsewhere: Secondary | ICD-10-CM | POA: Diagnosis not present

## 2023-12-09 DIAGNOSIS — W57XXXA Bitten or stung by nonvenomous insect and other nonvenomous arthropods, initial encounter: Secondary | ICD-10-CM | POA: Diagnosis not present

## 2023-12-09 DIAGNOSIS — S80862A Insect bite (nonvenomous), left lower leg, initial encounter: Secondary | ICD-10-CM | POA: Diagnosis not present

## 2023-12-09 DIAGNOSIS — S40862A Insect bite (nonvenomous) of left upper arm, initial encounter: Secondary | ICD-10-CM

## 2023-12-09 MED ORDER — CETIRIZINE HCL 10 MG PO TABS: 10.0000 mg | ORAL_TABLET | Freq: Every day | ORAL | 0 refills | Status: DC

## 2023-12-09 MED ORDER — TRIAMCINOLONE ACETONIDE 0.1 % EX CREA: 1.0000 | TOPICAL_CREAM | Freq: Two times a day (BID) | CUTANEOUS | 0 refills | Status: DC

## 2023-12-09 NOTE — Telephone Encounter (Signed)
  FYI Only or Action Required?: FYI only for provider.  Patient was last seen in primary care on 12/08/2023 by Gretta Comer POUR, NP.  Called Nurse Triage reporting Insect Bite.  Symptoms began yesterday.  Interventions attempted: Rest, hydration, or home remedies.  Symptoms are: gradually improving.  Triage Disposition: See Physician Within 24 Hours  Patient/caregiver understands and will follow disposition?: yes        Copied from CRM #[id number]. Topic: Clinical - Red Word Triage >> Dec 09, 2023  2:23 PM [place] J wrote: [facility] that prompted transfer to Nurse Triage: Patient was outside and was bit by a bug. The area where the bit happened started to flare up and burn. Area is also inflamed and very itchy. Reason for Disposition  [1] Red or very tender (to touch) area AND [2] started over 24 hours after the bite  Answer Assessment - Initial Assessment Questions 1. TYPE of INSECT: What type of insect was it?       Kissing bugs  2. ONSET: When did you get bitten?      Yesterday  3. LOCATION: Where is the insect bite located?      Leg, right arm, wrist beiow bicep, left ankle and left elbow  4. REDNESS: Is the area red or pink? If Yes, ask: What size is the area of redness? (inches or cm). When did the redness start?     Yes- bright red  5. PAIN: Is there any pain? If Yes, ask: How bad is the pain? (Scale 0-10; or none, mild, moderate, severe)     Burning has stopped  6. ITCHING: Does it itch? If Yes, ask: How bad is the itch?      yes 7. SWELLING: How big is the swelling? (e.g., inches, cm, or compare to coins)     inflamed 8. OTHER SYMPTOMS: Do you have any other symptoms?  (e.g., difficulty breathing, fever, hives)     no  Protocols used: Insect Bite-A-AH

## 2023-12-09 NOTE — Telephone Encounter (Signed)
 Noted. Agree with nursing triage decision. Appreciate Matt's evaluation.

## 2023-12-09 NOTE — Patient Instructions (Signed)
 Nice to see you today I have sent in a steroid cream to use twice a day for a week You can take the Zyrtec  nightly to help with the itching  If the redness or pain increases let me know. If you start having discharge from the spots that is colored or foul smelling let me know   You can use ice/cool compress to help

## 2023-12-09 NOTE — Progress Notes (Signed)
 Established Patient Office Visit  Subjective   Patient ID: Molly Wall, female    DOB: 11-12-1945  Age: 78 y.o. MRN: [id number]  Chief Complaint  Patient presents with   bug bites    Pt complains of bites that happened last night. States of blood red bumps, extreme itch and burning throughout the night. Pt thinks the bites are from kissing bugs and is concerned.     HPI  Discussed the use of AI scribe software for clinical note transcription with the patient, who gave verbal consent to proceed.  History of Present Illness Molly Wall is a 78 year old female who presents with multiple itchy welts following suspected insect bites.  She developed itchy welts after sitting on her patio and observing several shield-shaped bugs. Initially, there were two welts described as 'two little oblongs' side by side. Over time, the number increased to six, with one additional welt appearing on the morning of the visit.  The welts were intensely itchy and felt like 'fire,' with a burning sensation lasting approximately 30 minutes to an hour. Initially bright red, the welts had reduced slightly in size by the time she woke up. The itching persists, although she is able to avoid scratching them.  She has not taken any antihistamines or applied any topical treatments prior to the visit. She is concerned about the potential for infection.  Her current medications include Singulair , albuterol , alendronate , baby aspirin , atorvastatin , glucagon , insulin  via pump, levothyroxine , Ozempic , Wixela, metformin , and vitamin D . She is not currently taking turmeric due to her impending surgery.  No scratchy or itchy throat, cough, shortness of breath, chest pain, or diarrhea.    Review of Systems  Constitutional:  Negative for chills and fever.  Respiratory:  Negative for cough and shortness of breath.   Cardiovascular:  Negative for chest pain.  Gastrointestinal:  Negative for  abdominal pain, diarrhea and vomiting.  Skin:  Positive for itching and rash.      Objective:     BP 126/80   Pulse 76   Temp 97.8 F (36.6 C) (Oral)   Ht 5' 5 (1.651 m)   Wt 254 lb 12.8 oz (115.6 kg)   SpO2 99%   BMI 42.40 kg/m    Physical Exam Vitals and nursing note reviewed.  Constitutional:      Appearance: Normal appearance.  Cardiovascular:     Rate and Rhythm: Normal rate and regular rhythm.     Heart sounds: Normal heart sounds.  Pulmonary:     Effort: Pulmonary effort is normal.     Breath sounds: Normal breath sounds.  Skin:    Findings: Erythema, lesion and rash present.         Comments: Papular lesions with erythematous base.  Warmth to touch no discharge or drainage  Neurological:     Mental Status: She is alert.      No results found for any visits on 12/09/23.    The 10-year ASCVD risk score (Arnett DK, et al., 2019) is: 28.4%    Assessment & Plan:   Problem List Items Addressed This Visit   None Visit Diagnoses       Insect bite of right upper extremity, initial encounter    -  Primary   Relevant Medications   cetirizine  (ZYRTEC  ALLERGY) 10 MG tablet   triamcinolone  cream (KENALOG ) 0.1 %     Insect bite of left upper extremity, initial encounter       Relevant  Medications   cetirizine  (ZYRTEC  ALLERGY) 10 MG tablet   triamcinolone  cream (KENALOG ) 0.1 %     Insect bite of left lower extremity, initial encounter       Relevant Medications   cetirizine  (ZYRTEC  ALLERGY) 10 MG tablet   triamcinolone  cream (KENALOG ) 0.1 %     Assessment and Plan Assessment & Plan Insect bites with local skin reaction Multiple insect bites, likely from kissing bugs, causing local itching, burning, and redness. No systemic symptoms. Differential includes mosquito bites. Risk of secondary infection from scratching. Addressed concerns about antibiotic interaction with upcoming knee surgery. - Prescribed cetirizine  (Zyrtec ) at bedtime for itching. Advised  to double check with pharmacist regarding red dye due to surgery restrictions. - Prescribed topical cream twice daily for one week. - Advised cool compresses for itching and swelling. - Instructed to avoid scratching to prevent infection. - Advised to keep skin clean with soap and water, especially after outdoor activities. - Instructed to monitor for infection signs and seek medical attention if needed.    Return if symptoms worsen or fail to improve.    Adina Crandall, NP

## 2023-12-11 ENCOUNTER — Encounter: Payer: Self-pay | Admitting: Orthopedic Surgery

## 2023-12-11 NOTE — H&P (Signed)
 ORTHOPAEDIC HISTORY & PHYSICAL Back to top of Progress Notes Drake Fonda L[place], [place] - 12/08/2023 1:45 PM EDT Formatting of this note is different from the original. NAME: Molly Wall H&P Date: 12/08/2023 Procedure Date: 12/14/2023  Chief Complaint: right knee pain, swelling, and stiffness  HPI Molly Wall is a 78 y.o. female who has severe Right knee pain. Of note, patient underwent a left total knee arthroplasty that was performed by Dr. Mardee in May 2025. 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 She reports intermittent swelling and stiffness accompanied with dull and achy pains of the knee. She states that it greatly limits her ability to perform her ADLs and ambulate long distances as she would normally like. She has failed conservative treatment including NSAID's, injections, PT, and activity modification. 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 She denies any previous cardiac history. She does report a pulmonary history of asthma and obstructive sleep apnea for which she uses a CPAP. She denies any previous blood clots or DVTs. She is a diabetic, her last A1c was at 6.9. She does have a history of a right arthroscopic knee surgery and meniscectomy performed 10 years ago.  Social Hx: Patient lives at home with her grandson. She states that both her grandson and her daughter will be around postoperatively to help look after her. She denies any alcohol use, illicit drug use, nicotine use, or smoking.  Medications & Allergies Allergies: Allergies Allergen Reactions Iodine Anaphylaxis and Unknown Radioactive iodine dye Cortisone Other (See Comments) Elevates blood glucose  Home Medicines: Current Outpatient Medications on File Prior to Visit Medication Sig Dispense Refill acetaminophen  (TYLENOL ) 650 MG ER tablet Take 1-2 tablets by mouth 3 (three)  times daily as needed albuterol  90 mcg/actuation inhaler Inhale 2 inhalations into the lungs every 4 (four) hours as needed for Wheezing or Shortness of Breath aspirin  81 MG chewable tablet Take 81 mg by mouth 2 (two) times daily atorvastatin  (LIPITOR) 20 MG tablet Take 1 tablet by mouth daily for cholesterol. atorvastatin  (LIPITOR) 20 MG tablet Take 20 mg by mouth once daily (Patient not taking: Reported on 10/28/2023) azelastine  (ASTELIN ) 137 mcg nasal spray Place 2 sprays into both nostrils at bedtime azelastine  (ASTELIN ) 137 mcg nasal spray Place 2 sprays into both nostrils 2 (two) times daily blood glucose diagnostic (ONETOUCH VERIO TEST STRIPS) test strip USE AS DIRECTED TO CHECK FASTING BLOOD SUGARS three times DAILY. blood glucose diagnostic (ONETOUCH VERIO TEST STRIPS) test strip 1 strip 3 (three) times daily blood-glucose sensor (DEXCOM G7 SENSOR) Devi Inject 1 each subcutaneously every 10 (ten) days 9 each 3 celecoxib  (CELEBREX ) 200 MG capsule TAKE 1 CAPSULE BY MOUTH TWICE A DAY 60 capsule 1 cyanocobalamin, vitamin B-12, (VITAMIN B-12) 5,000 mcg Subl Place 1 tablet under the tongue twice a week docusate (COLACE) 100 MG capsule Take 100 mg by mouth once daily as needed for Constipation fluticasone  propion-salmeteroL (WIXELA INHUB) 250-50 mcg/dose diskus inhaler Inhale 1 Puff into the lungs every 12 (twelve) hours fluticasone  propionate (FLONASE ) 50 mcg/actuation nasal spray Place 2 sprays into both nostrils once daily as needed for Rhinitis or Allergies gabapentin  (NEURONTIN ) 100 MG capsule Take 100 mg by mouth 2 (two) times daily as needed glucagon  (BAQSIMI ) 3 mg/actuation nasal spray Place 3 mg into one nostril once HUMALOG  KWIKPEN INSULIN  pen injector (concentration 100 units/mL) Inject 25 Units subcutaneously as directed for High Blood  Sugar insulin  ASPART (NOVOLOG ) 100 unit/mL injection Inject 100 Units subcutaneously as needed for High Blood Sugar insulin  pmp cart,aut,G6/7,cntr  (OMNIPOD 5 G6-G7 INTRO KT,GEN5,) Crtg Inject 1 each subcutaneously every other day 10 each 0 insulin  pump cart,auto,BT,G6/7 (OMNIPOD 5 G6-G7 PODS, GEN 5,) Crtg Inject 1 each subcutaneously every other day 15 each 12 lancets 33 gauge Misc Inject subcutaneously levothyroxine  (SYNTHROID ) 88 MCG tablet Take 88 mcg by mouth once daily Monday-Saturday. One and 1/2 tabs each week on Sunday meclizine  (ANTIVERT ) 12.5 mg tablet Take 12.5 mg by mouth 3 (three) times daily as needed for Dizziness metFORMIN  (GLUCOPHAGE -XR) 500 MG XR tablet Take 1,000 mg by mouth once daily montelukast  (SINGULAIR ) 10 mg tablet Take by mouth NANO 2ND GEN PEN NEEDLE 32 gauge x 5/32 Ndle 3 (three) times daily naproxen sodium (ALEVE) 220 MG tablet Take 220 mg by mouth once daily as needed for Pain oxyCODONE  (ROXICODONE ) 5 MG immediate release tablet Take 5 mg by mouth every 4 (four) hours as needed OZEMPIC  1 mg/dose (4 mg/3 mL) pen injector Inject 1 mg subcutaneously once a week pen needle, diabetic (BD ULTRA-FINE MINI PEN NEEDLE) 31 gauge x 3/16 needle USE AS DIRECTED USE DAILY TO ADMINISTER VICTOZA  traMADoL  (ULTRAM ) 50 mg tablet Take 50-100 mg by mouth every 4 (four) hours as needed V-GO 30 Devi 1 Device as directed  No current facility-administered medications on file prior to visit.  Medical / Surgical History  Past Medical History: Diagnosis Date Asthma, unspecified asthma severity, unspecified whether complicated, unspecified whether persistent (HHS-HCC) Diabetes mellitus without complication (CMS/HHS-HCC) GERD (gastroesophageal reflux disease) 04/25/2021 History of cancer BREAST Hyperlipidemia Hypothyroidism 12/13/2014 OSA (obstructive sleep apnea) 06/21/2023 Substance abuse (CMS/HHS-HCC) Thyroid  disease Vitamin D  deficiency 07/18/2015   Past Surgical History: Procedure Laterality Date APPENDECTOMY breast mass removed right breast KNEE ARTHROSCOPY   Physical Exam  Ht:162.6 cm (5' 4.02) Wt:(!)  115.1 kg (253 lb 12.8 oz) BMI: Body mass index is 43.54 kg/m.  General/Constitutional: No apparent distress: well-nourished and well developed. Eyes: Pupils equal, round with synchronous movement. Lymphatic: No palpable adenopathy. Respiratory: Patient has good chest rise and fall with inspiration and expiration. All lung fields are clear to auscultation bilaterally. There is no Rales, rhonchi or wheezes appreciated. Cardiovascular: Upon auscultation there is a regular rate and rhythm without any murmurs, rubs, gallops or heaves appreciated. There does not appear to be any swelling down the lower extremities. Posterior tibial pulses appreciated bilaterally, 2+. Integumentary: No impressive skin lesions present, except as noted in detailed exam. Neuro/Psych: Normal mood and affect, oriented to person, place and time. Musculoskeletal: see exam below  Right knee exam Upon inspection of the patient's right knee there does not appear to be any skin changes, open abrasions, swelling or redness. There is a varus alignment. Upon palpation, the patient reports having pain along the medial aspect of their knee. Patient has 3 degrees off full extension actively with ROM, and able to flex back to 116 with mil pain. Varus and valgus stress testing shows positive laxity to valgus stressing. The patella tracks well within the femoral groove from flexion into extension with mild crepitus appreciated. Anterior and posterior drawer testing negative. Patient is neurovascularly intact down their lower extremity to all dermatomes. Posterior tibial pulses appreciated 2+.  Imaging right Knee Imaging: A series of x-rays were ordered and interpreted the patient's right knee. Images included AP weightbearing, lateral and sunrise views. Upon inspection, there is noticeable loss of medial joint space with near bone-on-bone articulation noted. Osteophyte formation  is present. Subchondral changes are appreciated. Overall  alignment is very slight varus. No fractures, lytic lesions or gross deformities are appreciated on films.  Assesment and Plan Knee DJD  I have recommended that Karmon G Wall undergo right total knee replacement. Consents has been signed. The risks, benefits, prognosis and alternatives including but not limited to DVT, PE, infection, neurovascular injury, failure of the procedure and death were explained to the patient and she is willing to proceed with surgery as described to her by myself. Plan will be for post operative admission of at least 1 midnight for pain control and PT. She will be managed with DVT prophylaxis, antibiotics preoperatively for 24 hours and aggressive in patient rehab.  Pre, intra and post op interventions were discussed. Patient has good understanding  Medication Reconciliation was performed. Discussed cessation of NSAIDs, vitamins and supplements.  A total of 45 minutes was spent reviewing patient's charts, medical reconciliation, discussing/educating the patient about surgical interventions, and answering any questions provided by the patient.  JOSHUA DALLAS KOYANAGI, PA [facility] 12/08/2023  Electronically signed by KOYANAGI Fonda DALLAS, PA at 12/08/2023 5:19 PM EDT

## 2023-12-13 ENCOUNTER — Other Ambulatory Visit: Payer: Self-pay | Admitting: Internal Medicine

## 2023-12-13 ENCOUNTER — Other Ambulatory Visit: Payer: Self-pay | Admitting: Primary Care

## 2023-12-13 ENCOUNTER — Encounter: Payer: Self-pay | Admitting: Orthopedic Surgery

## 2023-12-13 DIAGNOSIS — J4520 Mild intermittent asthma, uncomplicated: Secondary | ICD-10-CM

## 2023-12-13 MED ORDER — SODIUM CHLORIDE 0.9 % IV SOLN: INTRAVENOUS | Status: DC

## 2023-12-13 MED ORDER — TRANEXAMIC ACID-NACL 1000-0.7 MG/100ML-% IV SOLN
1000.0000 mg | INTRAVENOUS | Status: AC
  Administered 2023-12-14: 1000 mg via INTRAVENOUS

## 2023-12-13 MED ORDER — ORAL CARE MOUTH RINSE: 15.0000 mL | Freq: Once | OROMUCOSAL | Status: AC

## 2023-12-13 MED ORDER — CELECOXIB 200 MG PO CAPS
400.0000 mg | ORAL_CAPSULE | Freq: Once | ORAL | Status: AC
  Administered 2023-12-14: 400 mg via ORAL

## 2023-12-13 MED ORDER — CHLORHEXIDINE GLUCONATE 0.12 % MT SOLN
15.0000 mL | Freq: Once | OROMUCOSAL | Status: AC
Start: 2023-12-13 — End: 2023-12-14
  Administered 2023-12-14: 15 mL via OROMUCOSAL

## 2023-12-13 MED ORDER — CEFAZOLIN SODIUM-DEXTROSE 2-4 GM/100ML-% IV SOLN
2.0000 g | INTRAVENOUS | Status: AC
  Administered 2023-12-14: 2 g via INTRAVENOUS

## 2023-12-13 MED ORDER — CHLORHEXIDINE GLUCONATE 4 % EX SOLN: 60.0000 mL | Freq: Once | CUTANEOUS | Status: DC

## 2023-12-13 MED ORDER — GABAPENTIN 300 MG PO CAPS
300.0000 mg | ORAL_CAPSULE | Freq: Once | ORAL | Status: AC
  Administered 2023-12-14: 300 mg via ORAL

## 2023-12-13 MED ORDER — DEXAMETHASONE SODIUM PHOSPHATE 10 MG/ML IJ SOLN: 8.0000 mg | Freq: Once | INTRAMUSCULAR | Status: DC

## 2023-12-14 ENCOUNTER — Encounter
Admission: RE | Disposition: A | Discharge status: Home / Self Care | Payer: Self-pay | Source: Home / Self Care | Attending: Orthopedic Surgery

## 2023-12-14 ENCOUNTER — Ambulatory Visit: Payer: Self-pay

## 2023-12-14 ENCOUNTER — Observation Stay
Admission: RE | Admit: 2023-12-14 | Discharge: 2023-12-15 | Disposition: A | Discharge status: Home / Self Care | Attending: Orthopedic Surgery | Admitting: Orthopedic Surgery

## 2023-12-14 ENCOUNTER — Telehealth: Payer: Self-pay | Admitting: Rheumatology

## 2023-12-14 ENCOUNTER — Other Ambulatory Visit: Payer: Self-pay

## 2023-12-14 ENCOUNTER — Ambulatory Visit: Payer: Self-pay | Admitting: Urgent Care

## 2023-12-14 ENCOUNTER — Encounter: Payer: Self-pay | Admitting: Orthopedic Surgery

## 2023-12-14 ENCOUNTER — Observation Stay

## 2023-12-14 DIAGNOSIS — J45909 Unspecified asthma, uncomplicated: Secondary | ICD-10-CM | POA: Insufficient documentation

## 2023-12-14 DIAGNOSIS — Z794 Long term (current) use of insulin: Secondary | ICD-10-CM | POA: Diagnosis not present

## 2023-12-14 DIAGNOSIS — E66813 Obesity, class 3: Secondary | ICD-10-CM | POA: Diagnosis not present

## 2023-12-14 DIAGNOSIS — M1711 Unilateral primary osteoarthritis, right knee: Principal | ICD-10-CM | POA: Insufficient documentation

## 2023-12-14 DIAGNOSIS — Z853 Personal history of malignant neoplasm of breast: Secondary | ICD-10-CM | POA: Insufficient documentation

## 2023-12-14 DIAGNOSIS — E039 Hypothyroidism, unspecified: Secondary | ICD-10-CM | POA: Insufficient documentation

## 2023-12-14 DIAGNOSIS — J209 Acute bronchitis, unspecified: Secondary | ICD-10-CM

## 2023-12-14 DIAGNOSIS — E109 Type 1 diabetes mellitus without complications: Secondary | ICD-10-CM | POA: Diagnosis not present

## 2023-12-14 DIAGNOSIS — Z7982 Long term (current) use of aspirin: Secondary | ICD-10-CM | POA: Diagnosis not present

## 2023-12-14 DIAGNOSIS — Z79899 Other long term (current) drug therapy: Secondary | ICD-10-CM | POA: Diagnosis not present

## 2023-12-14 DIAGNOSIS — Z6841 Body Mass Index (BMI) 40.0 and over, adult: Secondary | ICD-10-CM | POA: Diagnosis not present

## 2023-12-14 DIAGNOSIS — M25561 Pain in right knee: Secondary | ICD-10-CM | POA: Diagnosis present

## 2023-12-14 DIAGNOSIS — Z96651 Presence of right artificial knee joint: Secondary | ICD-10-CM

## 2023-12-14 DIAGNOSIS — E1165 Type 2 diabetes mellitus with hyperglycemia: Secondary | ICD-10-CM

## 2023-12-14 DIAGNOSIS — R42 Dizziness and giddiness: Secondary | ICD-10-CM

## 2023-12-14 DIAGNOSIS — R112 Nausea with vomiting, unspecified: Secondary | ICD-10-CM

## 2023-12-14 HISTORY — PX: KNEE ARTHROPLASTY: SHX992

## 2023-12-14 LAB — GLUCOSE, CAPILLARY
Glucose-Capillary: 136 mg/dL — ABNORMAL HIGH (ref 70–99)
Glucose-Capillary: 262 mg/dL — ABNORMAL HIGH (ref 70–99)
Glucose-Capillary: 341 mg/dL — ABNORMAL HIGH (ref 70–99)
Glucose-Capillary: 377 mg/dL — ABNORMAL HIGH (ref 70–99)
Glucose-Capillary: 385 mg/dL — ABNORMAL HIGH (ref 70–99)
Glucose-Capillary: 440 mg/dL — ABNORMAL HIGH (ref 70–99)
Glucose-Capillary: 539 mg/dL (ref 70–99)

## 2023-12-14 SURGERY — ARTHROPLASTY, KNEE, TOTAL, USING IMAGELESS COMPUTER-ASSISTED NAVIGATION
Anesthesia: Spinal | Site: Knee | Laterality: Right

## 2023-12-14 MED ORDER — LIDOCAINE HCL (PF) 2 % IJ SOLN
INTRAMUSCULAR | Status: AC
  Filled 2023-12-14: qty 5

## 2023-12-14 MED ORDER — PHENYLEPHRINE 80 MCG/ML (10ML) SYRINGE FOR IV PUSH (FOR BLOOD PRESSURE SUPPORT)
PREFILLED_SYRINGE | INTRAVENOUS | Status: AC
  Filled 2023-12-14: qty 10

## 2023-12-14 MED ORDER — PANTOPRAZOLE SODIUM 40 MG PO TBEC
40.0000 mg | DELAYED_RELEASE_TABLET | Freq: Two times a day (BID) | ORAL | Status: DC
  Administered 2023-12-14 – 2023-12-15 (×3): 40 mg via ORAL
  Filled 2023-12-14 (×3): qty 1

## 2023-12-14 MED ORDER — INSULIN GLARGINE 100 UNIT/ML ~~LOC~~ SOLN
30.0000 [IU] | Freq: Every day | SUBCUTANEOUS | Status: DC
  Administered 2023-12-14 – 2023-12-15 (×2): 30 [IU] via SUBCUTANEOUS
  Filled 2023-12-14 (×2): qty 0.3

## 2023-12-14 MED ORDER — HYDROMORPHONE HCL 1 MG/ML IJ SOLN: 0.5000 mg | INTRAMUSCULAR | Status: DC | PRN

## 2023-12-14 MED ORDER — ATORVASTATIN CALCIUM 10 MG PO TABS
20.0000 mg | ORAL_TABLET | Freq: Every day | ORAL | Status: DC
  Administered 2023-12-14 – 2023-12-15 (×2): 20 mg via ORAL
  Filled 2023-12-14 (×2): qty 2

## 2023-12-14 MED ORDER — ACETAMINOPHEN 10 MG/ML IV SOLN
INTRAVENOUS | Status: DC | PRN
  Administered 2023-12-14: 1000 mg via INTRAVENOUS

## 2023-12-14 MED ORDER — LORATADINE 10 MG PO TABS
10.0000 mg | ORAL_TABLET | Freq: Every day | ORAL | Status: DC
  Administered 2023-12-14 – 2023-12-15 (×2): 10 mg via ORAL
  Filled 2023-12-14 (×2): qty 1

## 2023-12-14 MED ORDER — BUPIVACAINE HCL (PF) 0.5 % IJ SOLN
INTRAMUSCULAR | Status: DC | PRN
  Administered 2023-12-14: 3 mL via INTRATHECAL

## 2023-12-14 MED ORDER — TRANEXAMIC ACID-NACL 1000-0.7 MG/100ML-% IV SOLN
INTRAVENOUS | Status: AC
Start: 2023-12-14 — End: 2023-12-14
  Filled 2023-12-14: qty 100

## 2023-12-14 MED ORDER — ONDANSETRON HCL 4 MG/2ML IJ SOLN
INTRAMUSCULAR | Status: DC | PRN
Start: 2023-12-14 — End: 2023-12-14
  Administered 2023-12-14: 4 mg via INTRAVENOUS

## 2023-12-14 MED ORDER — INSULIN ASPART 100 UNIT/ML IJ SOLN
INTRAMUSCULAR | Status: AC
  Filled 2023-12-14: qty 1

## 2023-12-14 MED ORDER — PROPOFOL 500 MG/50ML IV EMUL
INTRAVENOUS | Status: DC | PRN
  Administered 2023-12-14: 80 ug/kg/min via INTRAVENOUS

## 2023-12-14 MED ORDER — PROPOFOL 10 MG/ML IV BOLUS
INTRAVENOUS | Status: DC | PRN
Start: 2023-12-14 — End: 2023-12-14
  Administered 2023-12-14: 30 mg via INTRAVENOUS

## 2023-12-14 MED ORDER — INSULIN ASPART 100 UNIT/ML IJ SOLN
0.0000 [IU] | INTRAMUSCULAR | Status: DC
  Administered 2023-12-14: 15 [IU] via SUBCUTANEOUS
  Administered 2023-12-15: 11 [IU] via SUBCUTANEOUS
  Administered 2023-12-15 (×2): 8 [IU] via SUBCUTANEOUS
  Administered 2023-12-15: 11 [IU] via SUBCUTANEOUS
  Filled 2023-12-14 (×4): qty 1

## 2023-12-14 MED ORDER — FENTANYL CITRATE (PF) 100 MCG/2ML IJ SOLN: 25.0000 ug | INTRAMUSCULAR | Status: DC | PRN

## 2023-12-14 MED ORDER — SURGIPHOR WOUND IRRIGATION SYSTEM - OPTIME
TOPICAL | Status: DC | PRN
  Administered 2023-12-14: 450 mL via TOPICAL

## 2023-12-14 MED ORDER — FLUTICASONE PROPIONATE 50 MCG/ACT NA SUSP
1.0000 | Freq: Two times a day (BID) | NASAL | Status: DC | PRN
Start: 2023-12-14 — End: 2023-12-15

## 2023-12-14 MED ORDER — ACETAMINOPHEN 10 MG/ML IV SOLN
1000.0000 mg | Freq: Four times a day (QID) | INTRAVENOUS | Status: AC
  Administered 2023-12-14 – 2023-12-15 (×4): 1000 mg via INTRAVENOUS
  Filled 2023-12-14 (×5): qty 100

## 2023-12-14 MED ORDER — MIDAZOLAM HCL 2 MG/2ML IJ SOLN
INTRAMUSCULAR | Status: AC
  Filled 2023-12-14: qty 2

## 2023-12-14 MED ORDER — ASPIRIN 81 MG PO CHEW
81.0000 mg | CHEWABLE_TABLET | Freq: Two times a day (BID) | ORAL | Status: DC
  Administered 2023-12-15: 81 mg via ORAL
  Filled 2023-12-14: qty 1

## 2023-12-14 MED ORDER — LEVOTHYROXINE SODIUM 88 MCG PO TABS
88.0000 ug | ORAL_TABLET | Freq: Every day | ORAL | Status: DC
  Administered 2023-12-15: 88 ug via ORAL
  Filled 2023-12-14: qty 1

## 2023-12-14 MED ORDER — INSULIN ASPART 100 UNIT/ML IJ SOLN: 0.0000 [IU] | Freq: Three times a day (TID) | INTRAMUSCULAR | Status: DC

## 2023-12-14 MED ORDER — IPRATROPIUM BROMIDE 0.03 % NA SOLN
2.0000 | Freq: Four times a day (QID) | NASAL | Status: DC
  Administered 2023-12-14 (×2): 2 via NASAL
  Filled 2023-12-14: qty 30

## 2023-12-14 MED ORDER — INSULIN ASPART 100 UNIT/ML IJ SOLN: 0.0000 [IU] | Freq: Every day | INTRAMUSCULAR | Status: DC

## 2023-12-14 MED ORDER — CELECOXIB 200 MG PO CAPS
200.0000 mg | ORAL_CAPSULE | Freq: Two times a day (BID) | ORAL | Status: DC
  Administered 2023-12-14 – 2023-12-15 (×2): 200 mg via ORAL
  Filled 2023-12-14 (×2): qty 1

## 2023-12-14 MED ORDER — SODIUM CHLORIDE 0.9 % IR SOLN
Status: DC | PRN
  Administered 2023-12-14: 3000 mL via INTRAVESICAL

## 2023-12-14 MED ORDER — OXYCODONE HCL 5 MG PO TABS
5.0000 mg | ORAL_TABLET | ORAL | Status: DC | PRN
  Administered 2023-12-14 – 2023-12-15 (×2): 5 mg via ORAL
  Filled 2023-12-14 (×2): qty 1

## 2023-12-14 MED ORDER — ACETAMINOPHEN 10 MG/ML IV SOLN
INTRAVENOUS | Status: AC
  Filled 2023-12-14: qty 100

## 2023-12-14 MED ORDER — TRAMADOL HCL 50 MG PO TABS
50.0000 mg | ORAL_TABLET | ORAL | Status: DC | PRN
  Administered 2023-12-14 (×2): 50 mg via ORAL
  Filled 2023-12-14: qty 2
  Filled 2023-12-14: qty 1

## 2023-12-14 MED ORDER — ONDANSETRON HCL 4 MG/2ML IJ SOLN: 4.0000 mg | Freq: Four times a day (QID) | INTRAMUSCULAR | Status: DC | PRN

## 2023-12-14 MED ORDER — CEFAZOLIN SODIUM-DEXTROSE 2-4 GM/100ML-% IV SOLN
INTRAVENOUS | Status: AC
  Filled 2023-12-14: qty 100

## 2023-12-14 MED ORDER — ALBUTEROL SULFATE (2.5 MG/3ML) 0.083% IN NEBU: 2.5000 mg | INHALATION_SOLUTION | Freq: Four times a day (QID) | RESPIRATORY_TRACT | Status: DC | PRN

## 2023-12-14 MED ORDER — METOCLOPRAMIDE HCL 10 MG PO TABS
10.0000 mg | ORAL_TABLET | Freq: Three times a day (TID) | ORAL | Status: DC
  Administered 2023-12-14 – 2023-12-15 (×4): 10 mg via ORAL
  Filled 2023-12-14 (×4): qty 1

## 2023-12-14 MED ORDER — SODIUM CHLORIDE 0.9 % IV SOLN
INTRAVENOUS | Status: DC | PRN
  Administered 2023-12-14: 60 mL

## 2023-12-14 MED ORDER — FERROUS SULFATE 325 (65 FE) MG PO TABS
325.0000 mg | ORAL_TABLET | Freq: Two times a day (BID) | ORAL | Status: DC
  Administered 2023-12-14 – 2023-12-15 (×2): 325 mg via ORAL
  Filled 2023-12-14 (×2): qty 1

## 2023-12-14 MED ORDER — MIDAZOLAM HCL 5 MG/5ML IJ SOLN
INTRAMUSCULAR | Status: DC | PRN
  Administered 2023-12-14 (×2): 1 mg via INTRAVENOUS

## 2023-12-14 MED ORDER — INSULIN GLARGINE 100 UNIT/ML ~~LOC~~ SOLN
30.0000 [IU] | Freq: Every day | SUBCUTANEOUS | Status: DC
  Filled 2023-12-14: qty 0.3

## 2023-12-14 MED ORDER — EPHEDRINE SULFATE-NACL 50-0.9 MG/10ML-% IV SOSY
PREFILLED_SYRINGE | INTRAVENOUS | Status: DC | PRN
  Administered 2023-12-14 (×3): 5 mg via INTRAVENOUS

## 2023-12-14 MED ORDER — INSULIN ASPART 100 UNIT/ML IJ SOLN
10.0000 [IU] | Freq: Once | INTRAMUSCULAR | Status: AC
  Administered 2023-12-14: 10 [IU] via SUBCUTANEOUS

## 2023-12-14 MED ORDER — CELECOXIB 200 MG PO CAPS
ORAL_CAPSULE | ORAL | Status: AC
  Filled 2023-12-14: qty 2

## 2023-12-14 MED ORDER — MAGNESIUM HYDROXIDE 400 MG/5ML PO SUSP
30.0000 mL | Freq: Every day | ORAL | Status: DC
  Administered 2023-12-15: 30 mL via ORAL
  Filled 2023-12-14: qty 30

## 2023-12-14 MED ORDER — PHENOL 1.4 % MT LIQD: 1.0000 | OROMUCOSAL | Status: DC | PRN

## 2023-12-14 MED ORDER — INSULIN ASPART 100 UNIT/ML IJ SOLN: 25.0000 [IU] | Freq: Three times a day (TID) | INTRAMUSCULAR | Status: DC

## 2023-12-14 MED ORDER — BISACODYL 10 MG RE SUPP: 10.0000 mg | Freq: Every day | RECTAL | Status: DC | PRN

## 2023-12-14 MED ORDER — DEXAMETHASONE SODIUM PHOSPHATE 10 MG/ML IJ SOLN
INTRAMUSCULAR | Status: AC
  Filled 2023-12-14: qty 1

## 2023-12-14 MED ORDER — GABAPENTIN 300 MG PO CAPS
ORAL_CAPSULE | ORAL | Status: AC
  Filled 2023-12-14: qty 1

## 2023-12-14 MED ORDER — ACETAMINOPHEN 325 MG PO TABS: 325.0000 mg | ORAL_TABLET | Freq: Four times a day (QID) | ORAL | Status: DC | PRN

## 2023-12-14 MED ORDER — DROPERIDOL 2.5 MG/ML IJ SOLN: 0.6250 mg | Freq: Once | INTRAMUSCULAR | Status: DC | PRN

## 2023-12-14 MED ORDER — TRANEXAMIC ACID-NACL 1000-0.7 MG/100ML-% IV SOLN
1000.0000 mg | Freq: Once | INTRAVENOUS | Status: AC
  Administered 2023-12-14: 1000 mg via INTRAVENOUS

## 2023-12-14 MED ORDER — EPHEDRINE 5 MG/ML INJ
INTRAVENOUS | Status: AC
  Filled 2023-12-14: qty 5

## 2023-12-14 MED ORDER — PHENYLEPHRINE 80 MCG/ML (10ML) SYRINGE FOR IV PUSH (FOR BLOOD PRESSURE SUPPORT)
PREFILLED_SYRINGE | INTRAVENOUS | Status: DC | PRN
  Administered 2023-12-14 (×2): 80 ug via INTRAVENOUS
  Administered 2023-12-14 (×3): 160 ug via INTRAVENOUS

## 2023-12-14 MED ORDER — INSULIN ASPART 100 UNIT/ML IJ SOLN
6.0000 [IU] | Freq: Three times a day (TID) | INTRAMUSCULAR | Status: DC
  Administered 2023-12-14 – 2023-12-15 (×3): 6 [IU] via SUBCUTANEOUS
  Filled 2023-12-14 (×3): qty 1

## 2023-12-14 MED ORDER — PROPOFOL 1000 MG/100ML IV EMUL
INTRAVENOUS | Status: AC
  Filled 2023-12-14: qty 100

## 2023-12-14 MED ORDER — MENTHOL 3 MG MT LOZG: 1.0000 | LOZENGE | OROMUCOSAL | Status: DC | PRN

## 2023-12-14 MED ORDER — SODIUM CHLORIDE 0.9 % IV SOLN: INTRAVENOUS | Status: DC

## 2023-12-14 MED ORDER — TRANEXAMIC ACID-NACL 1000-0.7 MG/100ML-% IV SOLN
INTRAVENOUS | Status: AC
  Filled 2023-12-14: qty 100

## 2023-12-14 MED ORDER — FENTANYL CITRATE (PF) 100 MCG/2ML IJ SOLN
INTRAMUSCULAR | Status: AC
  Filled 2023-12-14: qty 2

## 2023-12-14 MED ORDER — ONDANSETRON HCL 4 MG/2ML IJ SOLN
INTRAMUSCULAR | Status: AC
Start: 2023-12-14 — End: 2023-12-14
  Filled 2023-12-14: qty 2

## 2023-12-14 MED ORDER — ONDANSETRON HCL 4 MG PO TABS: 4.0000 mg | ORAL_TABLET | Freq: Four times a day (QID) | ORAL | Status: DC | PRN

## 2023-12-14 MED ORDER — MONTELUKAST SODIUM 10 MG PO TABS
10.0000 mg | ORAL_TABLET | Freq: Every day | ORAL | Status: DC
  Administered 2023-12-14: 10 mg via ORAL
  Filled 2023-12-14: qty 1

## 2023-12-14 MED ORDER — FLEET ENEMA RE ENEM: 1.0000 | ENEMA | Freq: Once | RECTAL | Status: DC | PRN

## 2023-12-14 MED ORDER — OXYCODONE HCL 5 MG PO TABS
10.0000 mg | ORAL_TABLET | ORAL | Status: DC | PRN
  Administered 2023-12-15: 10 mg via ORAL
  Filled 2023-12-14: qty 2

## 2023-12-14 MED ORDER — FENTANYL CITRATE (PF) 100 MCG/2ML IJ SOLN
INTRAMUSCULAR | Status: DC | PRN
  Administered 2023-12-14: 50 ug via INTRAVENOUS

## 2023-12-14 MED ORDER — SENNOSIDES-DOCUSATE SODIUM 8.6-50 MG PO TABS
1.0000 | ORAL_TABLET | Freq: Two times a day (BID) | ORAL | Status: DC
  Administered 2023-12-14 – 2023-12-15 (×3): 1 via ORAL
  Filled 2023-12-14 (×3): qty 1

## 2023-12-14 MED ORDER — ALUM & MAG HYDROXIDE-SIMETH 200-200-20 MG/5ML PO SUSP: 30.0000 mL | ORAL | Status: DC | PRN

## 2023-12-14 MED ORDER — AZELASTINE HCL 0.1 % NA SOLN
2.0000 | Freq: Every day | NASAL | Status: DC
  Administered 2023-12-14: 2 via NASAL
  Filled 2023-12-14: qty 30

## 2023-12-14 MED ORDER — DIPHENHYDRAMINE HCL 12.5 MG/5ML PO ELIX: 12.5000 mg | ORAL_SOLUTION | ORAL | Status: DC | PRN

## 2023-12-14 MED ORDER — FLUTICASONE FUROATE-VILANTEROL 200-25 MCG/ACT IN AEPB
1.0000 | INHALATION_SPRAY | Freq: Every day | RESPIRATORY_TRACT | Status: DC
  Filled 2023-12-14 (×2): qty 28

## 2023-12-14 MED ORDER — BUPIVACAINE HCL (PF) 0.5 % IJ SOLN
INTRAMUSCULAR | Status: AC
  Filled 2023-12-14: qty 10

## 2023-12-14 MED ORDER — CHLORHEXIDINE GLUCONATE 0.12 % MT SOLN
OROMUCOSAL | Status: AC
  Filled 2023-12-14: qty 15

## 2023-12-14 MED ORDER — CEFAZOLIN SODIUM-DEXTROSE 2-4 GM/100ML-% IV SOLN
2.0000 g | Freq: Four times a day (QID) | INTRAVENOUS | Status: AC
  Administered 2023-12-14 (×2): 2 g via INTRAVENOUS
  Filled 2023-12-14 (×2): qty 100

## 2023-12-14 MED ORDER — BUPIVACAINE HCL (PF) 0.25 % IJ SOLN
INTRAMUSCULAR | Status: DC | PRN
  Administered 2023-12-14: 60 mL

## 2023-12-14 SURGICAL SUPPLY — 65 items
ATTUNE PSFEM RTSZ5 NARCEM KNEE (Femur) IMPLANT
ATTUNE PSRP INSR SZ 5 10M KNEE (Insert) IMPLANT
BASEPLATE TIBIAL ROTATING SZ 4 (Knees) IMPLANT
BATTERY INSTRU NAVIGATION (MISCELLANEOUS) ×4 IMPLANT
BIT DRILL QUICK REL 1/8 2PK SL (BIT) ×1 IMPLANT
BLADE CLIPPER SURG (BLADE) IMPLANT
BLADE SAW 70X12.5 (BLADE) ×1 IMPLANT
BLADE SAW 90X13X1.19 OSCILLAT (BLADE) ×1 IMPLANT
BLADE SAW 90X25X1.19 OSCILLAT (BLADE) ×1 IMPLANT
BRUSH SCRUB EZ PLAIN DRY (MISCELLANEOUS) ×1 IMPLANT
CEMENT BONE GENTAMICIN 40 (Cement) IMPLANT
COOLER ICEMAN CLASSIC (MISCELLANEOUS) ×1 IMPLANT
CUFF TRNQT CYL 34X4.125X (TOURNIQUET CUFF) IMPLANT
DRAPE SHEET LG 3/4 BI-LAMINATE (DRAPES) ×1 IMPLANT
DRSG AQUACEL AG ADV 3.5X14 (GAUZE/BANDAGES/DRESSINGS) ×1 IMPLANT
DRSG MEPILEX SACRM 8.7X9.8 (GAUZE/BANDAGES/DRESSINGS) ×1 IMPLANT
DRSG OPSITE POSTOP 4X14 (GAUZE/BANDAGES/DRESSINGS) IMPLANT
DRSG TEGADERM 4X4.75 (GAUZE/BANDAGES/DRESSINGS) ×1 IMPLANT
DRSG XEROFORM 1X8 (GAUZE/BANDAGES/DRESSINGS) IMPLANT
DURAPREP 26ML APPLICATOR (WOUND CARE) ×2 IMPLANT
ELECT CAUTERY BLADE 6.4 (BLADE) ×1 IMPLANT
ELECTRODE REM PT RTRN 9FT ADLT (ELECTROSURGICAL) ×1 IMPLANT
EVACUATOR 1/8 PVC DRAIN (DRAIN) ×1 IMPLANT
EX-PIN ORTHOLOCK NAV 4X150 (PIN) ×2 IMPLANT
GAUZE XEROFORM 1X8 LF (GAUZE/BANDAGES/DRESSINGS) ×1 IMPLANT
GLOVE BIOGEL M STRL SZ7.5 (GLOVE) ×6 IMPLANT
GLOVE BIOGEL PI IND STRL 8 (GLOVE) ×1 IMPLANT
GLOVE SRG 8 PF TXTR STRL LF DI (GLOVE) ×1 IMPLANT
GOWN STRL REUS W/ TWL LRG LVL3 (GOWN DISPOSABLE) ×1 IMPLANT
GOWN STRL REUS W/ TWL XL LVL3 (GOWN DISPOSABLE) ×1 IMPLANT
GOWN TOGA ZIPPER T7+ PEEL AWAY (MISCELLANEOUS) ×1 IMPLANT
HOLDER FOLEY CATH W/STRAP (MISCELLANEOUS) ×1 IMPLANT
HOOD PEEL AWAY T7 (MISCELLANEOUS) ×1 IMPLANT
KIT TURNOVER KIT A (KITS) ×1 IMPLANT
KNIFE SCULPS 14X20 (INSTRUMENTS) ×1 IMPLANT
MANIFOLD NEPTUNE II (INSTRUMENTS) ×2 IMPLANT
NDL SPNL 20GX3.5 QUINCKE YW (NEEDLE) ×2 IMPLANT
NEEDLE SPNL 20GX3.5 QUINCKE YW (NEEDLE) ×2 IMPLANT
PACK TOTAL KNEE (MISCELLANEOUS) ×1 IMPLANT
PAD ABD DERMACEA PRESS 5X9 (GAUZE/BANDAGES/DRESSINGS) ×2 IMPLANT
PAD ARMBOARD POSITIONER FOAM (MISCELLANEOUS) ×3 IMPLANT
PAD COLD UNI WRAP-ON (PAD) ×1 IMPLANT
PATELLA MEDIAL ATTUN 35MM KNEE (Knees) IMPLANT
PENCIL SMOKE EVACUATOR COATED (MISCELLANEOUS) ×1 IMPLANT
PIN DRILL FIX HALF THREAD (BIT) ×2 IMPLANT
PIN FIXATION 1/8DIA X 3INL (PIN) ×1 IMPLANT
SOL .9 NS 3000ML IRR UROMATIC (IV SOLUTION) ×1 IMPLANT
SOLN STERILE WATER 1000 ML (IV SOLUTION) ×1 IMPLANT
SOLN STERILE WATER BTL 1000 ML (IV SOLUTION) ×1 IMPLANT
SOLUTION IRRIG SURGIPHOR (IV SOLUTION) ×1 IMPLANT
SPONGE DRAIN TRACH 4X4 STRL 2S (GAUZE/BANDAGES/DRESSINGS) ×1 IMPLANT
STAPLER SKIN PROX 35W (STAPLE) ×1 IMPLANT
STOCKINETTE IMPERV 14X48 (MISCELLANEOUS) ×1 IMPLANT
STOCKINETTE STRL BIAS CUT 8X4 (MISCELLANEOUS) ×1 IMPLANT
STRAP TIBIA SHORT (MISCELLANEOUS) ×1 IMPLANT
SUCTION TUBE FRAZIER 10FR DISP (SUCTIONS) ×1 IMPLANT
SUT VIC AB 0 CT1 36 (SUTURE) ×1 IMPLANT
SUT VIC AB 1 CT1 36 (SUTURE) ×2 IMPLANT
SUT VIC AB 2-0 CT2 27 (SUTURE) ×1 IMPLANT
SYR 30ML LL (SYRINGE) ×2 IMPLANT
TIP FAN IRRIG PULSAVAC PLUS (DISPOSABLE) ×1 IMPLANT
TOWEL OR 17X26 4PK STRL BLUE (TOWEL DISPOSABLE) IMPLANT
TOWER CARTRIDGE SMART MIX (DISPOSABLE) ×1 IMPLANT
TRAP FLUID SMOKE EVACUATOR (MISCELLANEOUS) ×1 IMPLANT
TRAY FOLEY MTR SLVR 16FR STAT (SET/KITS/TRAYS/PACK) ×1 IMPLANT

## 2023-12-14 NOTE — Anesthesia Preprocedure Evaluation (Signed)
 Anesthesia Evaluation  Patient identified by MRN, date of birth, ID band Patient awake    Reviewed: Allergy & Precautions, NPO status , Patient's Chart, lab work & pertinent test results  History of Anesthesia Complications Negative for: history of anesthetic complications  Airway Mallampati: III  TM Distance: >3 FB Neck ROM: full    Dental  (+) Dental Advidsory Given, Chipped, Poor Dentition   Pulmonary neg shortness of breath, asthma , sleep apnea and Continuous Positive Airway Pressure Ventilation , neg COPD, neg recent URI   Pulmonary exam normal        Cardiovascular negative cardio ROS Normal cardiovascular exam     Neuro/Psych neg Seizures  Neuromuscular disease  negative psych ROS   GI/Hepatic Neg liver ROS,GERD  Medicated,,  Endo/Other  diabetes, Type 1, Insulin  DependentHypothyroidism  Class 3 obesity  Renal/GU negative Renal ROS     Musculoskeletal   Abdominal   Peds  Hematology negative hematology ROS (+)   Anesthesia Other Findings Past Medical History: 11/20/2021: Adventitious breath sounds No date: Allergic rhinitis No date: Allergy No date: Arthritis     Comment:  bilateral knees No date: Asthma 08/12/2017: Atypical ductal hyperplasia of right breast No date: Breast cancer (HCC) No date: Diabetes mellitus without complication (HCC) No date: Family history of breast cancer No date: Family history of colon cancer No date: Hypercholesteremia No date: Hypothyroidism No date: Lumbar herniated disc     Comment:  4-5th lumbar No date: OSA (obstructive sleep apnea)     Comment:  used to use CPAP, not any more over 1 1/2 yrs No date: Osteoarthritis No date: Salivary stone     Comment:  left side No date: Sleep apnea     Comment:  no cpap in 2 yrs  No date: Vitamin D  deficiency  Past Surgical History: No date: APPENDECTOMY 07/10/2023: BREAST BIOPSY; Left     Comment:  MM LT BREAST BX W LOC DEV  1ST LESION IMAGE BX SPEC               STEREO GUIDE 07/10/2023 [facility] MAMMOGRAPHY No date: BREAST LUMPECTOMY; Right     Comment:  per pt - precancerous 08/12/2017: BREAST LUMPECTOMY WITH RADIOACTIVE SEED LOCALIZATION;  Right     Comment:  Procedure: RIGHT BREAST LUMPECTOMY X'S 2 WITH               RADIOACTIVE SEED LOCALIZATION X'S 2;  Surgeon: Gail Favorite, MD;  Location: [facility];                Service: General;  Laterality: Right; No date: COLONOSCOPY     [place] but given a 10 yr               recall per pt  No date: GANGLION CYST EXCISION     Comment:  78 years old No date: INCISION AND DRAINAGE ABSCESS ANAL 2014: KNEE SURGERY 1974: SHOULDER SURGERY No date: TONSILLECTOMY     Comment:  age 78  BMI    Body Mass Index: 40.10 kg/m      Reproductive/Obstetrics negative OB ROS                              Anesthesia Physical Anesthesia Plan  ASA: 3  Anesthesia Plan: Spinal   Post-op Pain Management:  Regional block*, Gabapentin  PO (pre-op)*, Celebrex  PO (pre-op)* and Ofirmev  IV (intra-op)*   Induction: Intravenous  PONV Risk Score and Plan: 2 and Propofol  infusion, TIVA and Treatment may vary due to age or medical condition  Airway Management Planned: Natural Airway and Nasal Cannula  Additional Equipment:   Intra-op Plan:   Post-operative Plan:   Informed Consent: I have reviewed the patients History and Physical, chart, labs and discussed the procedure including the risks, benefits and alternatives for the proposed anesthesia with the patient or authorized representative who has indicated his/her understanding and acceptance.     Dental Advisory Given  Plan Discussed with: Anesthesiologist, CRNA and Surgeon  Anesthesia Plan Comments: (Patient reports no bleeding problems and no anticoagulant use.  Plan for spinal with backup GA  Patient consented for risks of  anesthesia including but not limited to:  - adverse reactions to medications - damage to eyes, teeth, lips or other oral mucosa - nerve damage due to positioning  - risk of bleeding, infection and or nerve damage from spinal that could lead to paralysis - risk of headache or failed spinal - damage to teeth, lips or other oral mucosa - sore throat or hoarseness - damage to heart, brain, nerves, lungs, other parts of body or loss of life  Patient voiced understanding and assent.)        Anesthesia Quick Evaluation

## 2023-12-14 NOTE — Op Note (Addendum)
 OPERATIVE NOTE  DATE OF SURGERY:  12/14/2023  PATIENT NAME:  Molly Wall   DOB: June 18, 1945  MRN: [id number]  PRE-OPERATIVE DIAGNOSIS: Degenerative arthrosis of the right knee, primary  POST-OPERATIVE DIAGNOSIS:  Same  PROCEDURE:  Right total knee arthroplasty using computer-assisted navigation  SURGEON:  Lynwood SHAUNNA Mardee Mickey. M.D.  ANESTHESIA: spinal  ESTIMATED BLOOD LOSS: 50 mL  FLUIDS REPLACED: 900 mL of crystalloid  TOURNIQUET TIME: 95 minutes  DRAINS: 2 medium Hemovac drains  SOFT TISSUE RELEASES: Anterior cruciate ligament, posterior cruciate ligament, deep medial collateral ligament, patellofemoral ligament  IMPLANTS UTILIZED: DePuy Attune size 5N posterior stabilized femoral component (cemented), size 4 rotating platform tibial component (cemented), 35 mm medialized dome patella (cemented), and a 10 mm stabilized rotating platform polyethylene insert.  INDICATIONS FOR SURGERY: Molly Wall is a 78 y.o. year old female with a long history of progressive knee pain. X-rays demonstrated severe degenerative changes in tricompartmental fashion. The patient had not seen any significant improvement despite conservative nonsurgical intervention. After discussion of the risks and benefits of surgical intervention, the patient expressed understanding of the risks benefits and agree with plans for total knee arthroplasty.   The risks, benefits, and alternatives were discussed at length including but not limited to the risks of infection, bleeding, nerve injury, stiffness, blood clots, the need for revision surgery, cardiopulmonary complications, among others, and they were willing to proceed.  PROCEDURE IN DETAIL: The patient was brought into the operating room and, after adequate spinal anesthesia was achieved, a tourniquet was placed on the patient's upper thigh. The patient's knee and leg were cleaned and prepped with alcohol and DuraPrep and draped in the  usual sterile fashion. A timeout was performed as per usual protocol. The lower extremity was exsanguinated using an Esmarch, and the tourniquet was inflated to 300 mmHg. An anterior longitudinal incision was made followed by a standard mid vastus approach. The deep fibers of the medial collateral ligament were elevated in a subperiosteal fashion off of the medial flare of the tibia so as to maintain a continuous soft tissue sleeve. The patella was subluxed laterally and the patellofemoral ligament was incised. Inspection of the knee demonstrated severe degenerative changes with full-thickness loss of articular cartilage. Osteophytes were debrided using a rongeur. Anterior and posterior cruciate ligaments were excised. Two 4.0 mm Schanz pins were inserted in the femur and into the tibia for attachment of the array of trackers used for computer-assisted navigation. Hip center was identified using a circumduction technique. Distal landmarks were mapped using the computer. The distal femur and proximal tibia were mapped using the computer. The distal femoral cutting guide was positioned using computer-assisted navigation so as to achieve a 5 distal valgus cut. The femur was sized and it was felt that a size 5N femoral component was appropriate. A size 5 femoral cutting guide was positioned and the anterior cut was performed and verified using the computer. This was followed by completion of the posterior and chamfer cuts. Femoral cutting guide for the central box was then positioned in the center box cut was performed.  Attention was then directed to the proximal tibia. Medial and lateral menisci were excised. The extramedullary tibial cutting guide was positioned using computer-assisted navigation so as to achieve a 0 varus-valgus alignment and 3 posterior slope. The cut was performed and verified using the computer. The proximal tibia was sized and it was felt that a size 4 tibial tray was appropriate. Tibial  and femoral trials were  inserted followed by insertion of a 10 mm polyethylene insert. This allowed for excellent mediolateral soft tissue balancing both in flexion and in full extension. Finally, the patella was cut and prepared so as to accommodate a 35 mm medialized dome patella. A patella trial was placed and the knee was placed through a range of motion with excellent patellar tracking appreciated. The femoral trial was removed after debridement of posterior osteophytes. The central post-hole for the tibial component was reamed followed by insertion of a keel punch. Tibial trials were then removed. Cut surfaces of bone were irrigated with copious amounts of normal saline using pulsatile lavage and then suctioned dry. Polymethylmethacrylate cement with gentamicin was prepared in the usual fashion using a vacuum mixer. Cement was applied to the cut surface of the proximal tibia as well as along the undersurface of a size 4 rotating platform tibial component. Tibial component was positioned and impacted into place. Excess cement was removed using Personal assistant. Cement was then applied to the cut surfaces of the femur as well as along the posterior flanges of the size 5N femoral component. The femoral component was positioned and impacted into place. Excess cement was removed using Personal assistant. A 10 mm polyethylene trial was inserted and the knee was brought into full extension with steady axial compression applied. Finally, cement was applied to the backside of a 35 mm medialized dome patella and the patellar component was positioned and patellar clamp applied. Excess cement was removed using Personal assistant. After adequate curing of the cement, the tourniquet was deflated after a total tourniquet time of 95 minutes. Hemostasis was achieved using electrocautery. The knee was irrigated with copious amounts of normal saline using pulsatile lavage followed by 450 ml of Surgiphor and then suctioned dry. 20 mL of  1.3% Exparel  and 60 mL of 0.25% Marcaine  in 40 mL of normal saline was injected along the posterior capsule, medial and lateral gutters, and along the arthrotomy site. A 10 mm stabilized rotating platform polyethylene insert was inserted and the knee was placed through a range of motion with excellent mediolateral soft tissue balancing appreciated and excellent patellar tracking noted. 2 medium drains were placed in the wound bed and brought out through separate stab incisions. The medial parapatellar portion of the incision was reapproximated using interrupted sutures of #1 Vicryl. Subcutaneous tissue was approximated in layers using first #0 Vicryl followed #2-0 Vicryl. The skin was approximated with skin staples. A sterile dressing was applied.  The patient tolerated the procedure well and was transported to the recovery room in stable condition.    Yeslin Delio P. Alaija Ruble, Jr., M.D.

## 2023-12-14 NOTE — Inpatient Diabetes Management (Addendum)
 Inpatient Diabetes Program Recommendations  AACE/ADA: New Consensus Statement on Inpatient Glycemic Control (2015)  Target Ranges:  Prepandial:   less than 140 mg/dL      Peak postprandial:   less than 180 mg/dL (1-2 hours)      Critically ill patients:  140 - 180 mg/dL    Latest Reference Range & Units 12/07/23 11:10  Hemoglobin A1C 4.8 - 5.6 % 6.9 (H)  (H): Data is abnormally high  Latest Reference Range & Units 12/14/23 06:39 12/14/23 11:30  Glucose-Capillary 70 - 99 mg/dL 863 (H) 737 (H)  (H): Data is abnormally high   Admit for R Total Knee  History: DM  Home DM Meds: Dexcom G7 CGM       VGO-30 Insulin  Pump       Humalog  25 units TID with meals       Ozempic  1 mg Qweek       Metformin  1000 mg daily (NOT taking)       Looks like pt now using OmniPod 5 Insulin  Pump  Current Orders: Novolog  Moderate Correction Scale/ SSI (0-15 units) TID AC + HS     Novolog  25 units TID with meals   Met w/ pt and 2 daughters at bedside.  Dtrs and Pt told me pt having lots of issues with Hypoglycemia at home.  Pt has not switched to the OmniPod Insulin  pump yet (from the VGo30 pump).  Often give herself extra Novolog  insulin  with the insulin  pen b/c the VGo pump runs out of insulin .  CBG up to 377 at 2:45pm.  Dtrs concerned that pt not at 100% to be self managing her insulin  pump and would prefer if the RN's dose all her insulin .  I chatted with Dr. Mardee and the RN and decision made for the following: 1. Give Novolog  10 units X 1 dose now to cover the CBG of 377  2. Start Lantus  30 units daily as soon as dose arrives from pharmacy--Have pt take off her Insulin  pump 1 hour after Lantus  on board  3. Start Novolog  Moderate Correction Scale/ SSI (0-15 units) Q4 hours  4. Start Novolog  6 units TID with meals for meal coverage  Reviewed the above insulin  orders with RN and pt and family.  Pt and Dtrs in agreement with plan.  RN comfortable and agreeable with orders.  Orders placed.      ENDO:Dr. Cherilyn Last Seen 10/28/2023 A1c was 7.4% at this visit Plan was to have pt start the OmniPod Insulin  Pump At that visit, pt was taking the following: MTF XR 1000 in the morning (she sometimes misses her dose) V-go 30 (7 clicks for breakfast, 6 clicks for lunch and 7 for dinner) She also takes humalog  injections most days as she runs out of clicks She is off of Ozempic    11/03/2023: Filled out OmniPod pump start orders Basal: Midnight = 1.8.  Carb Ratio = 5 Correction = 20 Target = 120.  Breakfast, lunch, dinner and snack presets  breakfast(50), lunch(45), dinner(50) and snack (20)     --Will follow patient during hospitalization--  Adina Rudolpho Arrow RN, MSN, CDCES Diabetes Coordinator Inpatient Glycemic Control Team Team Pager: [phone] (8a-5p)

## 2023-12-14 NOTE — Plan of Care (Signed)
   Problem: Pain Management: Goal: Pain level will decrease with appropriate interventions Outcome: Progressing

## 2023-12-14 NOTE — Progress Notes (Signed)
 Pts BG 440, Dr. Mardee at bedside and aware.

## 2023-12-14 NOTE — Telephone Encounter (Signed)
 Patient has not been contacted from our office since 12/04/2023.

## 2023-12-14 NOTE — Progress Notes (Signed)
 Glucose 539, 15 units given as ordered,  recheck at 10:00pm as ordered

## 2023-12-14 NOTE — Anesthesia Procedure Notes (Signed)
 Spinal  Patient location during procedure: OR Start time: 12/14/2023 7:29 AM End time: 12/14/2023 7:34 AM Reason for block: surgical anesthesia Staffing Performed: resident/CRNA  Resident/CRNA: Trudy Rankin LABOR, CRNA Performed by: Trudy Rankin LABOR, CRNA Authorized by: Dario Barter, MD   Preanesthetic Checklist Completed: patient identified, IV checked, site marked, risks and benefits discussed, surgical consent, monitors and equipment checked, pre-op evaluation and timeout performed Spinal Block Patient position: sitting Prep: Betadine Patient monitoring: heart rate, continuous pulse ox, blood pressure and cardiac monitor Approach: midline Location: L3-4 Injection technique: single-shot Needle Needle type: Introducer and Pencan  Needle gauge: 24 G Needle length: 9 cm Assessment Sensory level: T4 Events: CSF return Additional Notes Negative paresthesia. Negative blood return. Positive free-flowing CSF. Expiration date of kit checked and confirmed. Patient tolerated procedure well, without complications.

## 2023-12-14 NOTE — Evaluation (Signed)
 Physical Therapy Evaluation Patient Details Name: Molly Wall MRN: [id number] DOB: [birth year] Today's Date: 12/14/2023  History of Present Illness  admitted for acute hospitalization s/p R TKA, 12/14/23, WBAT.  Clinical Impression  Patient seated on BSC upon arrival to session; voices need to empty bladder, but feels BSC too narrow making elimination difficult.  Obtained wider BSC for improved comfort/positioning; assisted to wider [facility] during session to complete toileting needs. Patient generally alert and oriented, follows commands and agreeable to participation with session.  R knee pain grossly 5-6/10 (FACES scale); meds received prior to session.  Demonstrates fair post-op strength (grossly 3-/5) and ROM (3-85 degrees), limited by post-op soreness and dressing. Currently requires cga/min assist for sit/stand, standing balance and multiple transfers with RW.  Requires cuing for hand placement.  Tends to maintain R LE in extension, propped anterior to BOS (minimal active use/WBing until upright).  Decreased loading/stance time in standing, stand pivot efforts; however, no overt buckling or LOB. Additional gait efforts deferred due to above-noted toileting efforts and increased pain with mobility.  Will continue to assess/progress in subsequent sessions as appropriate. Do anticipate consistent progress towards goals, as patient comfortable with expectations, progression from previous surgery (May, 2025) Would benefit from skilled PT to address above deficits and promote optimal return to PLOF.; recommend post-acute PT follow up as indicated by interdisciplinary care team.          If plan is discharge home, recommend the following: A little help with walking and/or transfers;A little help with bathing/dressing/bathroom   Can travel by private vehicle        Equipment Recommendations    Recommendations for Other Services       Functional Status Assessment Patient has had a  recent decline in their functional status and demonstrates the ability to make significant improvements in function in a reasonable and predictable amount of time.     Precautions / Restrictions Precautions Precautions: Fall Restrictions Weight Bearing Restrictions Per Provider Order: Yes RLE Weight Bearing Per Provider Order: Weight bearing as tolerated      Mobility  Bed Mobility               General bed mobility comments: up on BSC upon arrival to session; in recliner end of session    Transfers Overall transfer level: Needs assistance Equipment used: Rolling walker (2 wheels) Transfers: Sit to/from Stand, Bed to chair/wheelchair/BSC Sit to Stand: Contact guard assist, Min assist Stand pivot transfers: Contact guard assist, Min assist         General transfer comment: cuing for hand placement; minimal active use of R LE with transitional movements    Ambulation/Gait               General Gait Details: deferred this session due to toileting needs  Stairs            Wheelchair Mobility     Tilt Bed    Modified Rankin (Stroke Patients Only)       Balance Overall balance assessment: Needs assistance Sitting-balance support: No upper extremity supported, Feet supported Sitting balance-Leahy Scale: Good     Standing balance support: Bilateral upper extremity supported Standing balance-Leahy Scale: Fair                               Pertinent Vitals/Pain Pain Assessment Pain Assessment: Faces Faces Pain Scale: Hurts even more Pain Location: R knee Pain Descriptors / Indicators:  Aching Pain Intervention(s): Limited activity within patient's tolerance, Monitored during session, Premedicated before session, Repositioned    Home Living Family/patient expects to be discharged to:: Private residence Living Arrangements: Alone Available Help at Discharge: Family;Available 24 hours/day Type of Home: House Home Access: Level  entry       Home Layout: Two level;Able to live on main level with bedroom/bathroom Home Equipment: Rolling Walker (2 wheels);BSC/3in1      Prior Function Prior Level of Function : Independent/Modified Independent             Mobility Comments: Mod indep with use of SPC vs RW; + driving; denies fall history.       Extremity/Trunk Assessment   Upper Extremity Assessment Upper Extremity Assessment: Overall WFL for tasks assessed    Lower Extremity Assessment Lower Extremity Assessment:  (R knee grossly 3-/5 and grossly 3-85 degrees, limited by post-op soreness and post-op dressing)       Communication   Communication Communication: No apparent difficulties    Cognition Arousal: Alert Behavior During Therapy: WFL for tasks assessed/performed   PT - Cognitive impairments: No apparent impairments                         Following commands: Intact       Cueing Cueing Techniques: Verbal cues     General Comments      Exercises Total Joint Exercises Ankle Circles/Pumps: AROM, 10 reps Long Arc Quad: AAROM, 10 reps Knee Flexion: AAROM, 10 reps Goniometric ROM: R knee: 3-85 degrees Other Exercises Other Exercises: Toilet transfer (X2) during session, cga/min assist with RW; cuing for hand placement.  Tends to maintain R LE in extension, propped anterior to BOS (minimal active use/WBing until upright) Other Exercises: Patient requests/prefers wider BSC; obtained and transferred to during session.  Left in room for remainder of stay.   Assessment/Plan    PT Assessment Patient needs continued PT services  PT Problem List Decreased strength;Decreased range of motion;Decreased safety awareness;Decreased balance;Decreased mobility;Decreased activity tolerance;Decreased knowledge of use of DME;Decreased knowledge of precautions;Pain       PT Treatment Interventions DME instruction;Gait training;Functional mobility training;Therapeutic activities;Therapeutic  exercise;Balance training;Patient/family education    PT Goals (Current goals can be found in the Care Plan section)  Acute Rehab PT Goals Patient Stated Goal: to go to the bathroom PT Goal Formulation: With patient Time For Goal Achievement: 12/28/23 Potential to Achieve Goals: Good    Frequency BID     Co-evaluation               AM-PAC PT 6 Clicks Mobility  Outcome Measure Help needed turning from your back to your side while in a flat bed without using bedrails?: A Little Help needed moving from lying on your back to sitting on the side of a flat bed without using bedrails?: A Little Help needed moving to and from a bed to a chair (including a wheelchair)?: A Little Help needed standing up from a chair using your arms (e.g., wheelchair or bedside chair)?: A Little Help needed to walk in hospital room?: A Little Help needed climbing 3-5 steps with a railing? : A Lot 6 Click Score: 17    End of Session   Activity Tolerance: Patient tolerated treatment well Patient left: in chair;with call bell/phone within reach;with family/visitor present Nurse Communication: Mobility status PT Visit Diagnosis: Muscle weakness (generalized) (M62.81);Difficulty in walking, not elsewhere classified (R26.2);Pain Pain - Right/Left: Right Pain - part of body:  Knee    Time: 8478-8440 PT Time Calculation (min) (ACUTE ONLY): 38 min   Charges:   PT Evaluation $PT Eval Moderate Complexity: 1 Mod PT Treatments $Therapeutic Activity: 8-22 mins PT General Charges $$ ACUTE PT VISIT: 1 Visit        Travone Georg H. Delores, PT, DPT, NCS 12/14/23, 4:10 PM [phone]

## 2023-12-14 NOTE — Transfer of Care (Signed)
 Immediate Anesthesia Transfer of Care Note  Patient: Molly Wall  Procedure(s) Performed: ARTHROPLASTY, KNEE, TOTAL, USING IMAGELESS COMPUTER-ASSISTED NAVIGATION (Right: Knee)  Patient Location: PACU  Anesthesia Type:Spinal  Level of Consciousness: drowsy  Airway & Oxygen Therapy: Patient Spontanous Breathing and Patient connected to nasal cannula oxygen  Post-op Assessment: Report given to RN  Post vital signs: stable  Last Vitals:  Vitals Value Taken Time  BP 110/57 12/14/23 11:16  Temp    Pulse 78 12/14/23 11:19  Resp 12 12/14/23 11:19  SpO2 99 % 12/14/23 11:19  Vitals shown include unfiled device data.  Last Pain:  Vitals:   12/14/23 0656  PainSc: 4          Complications: No notable events documented.

## 2023-12-14 NOTE — Telephone Encounter (Signed)
 Patient left a voicemail (Friday, 12/11/23 at 1:18 pm) stating she was returning a call to the office.  Patient states she is scheduled for knee surgery on Monday, 12/14/23 and will be unavailable to talk until she is discharged on 12/16/23.

## 2023-12-14 NOTE — Interval H&P Note (Signed)
 History and Physical Interval Note:  12/14/2023 6:31 AM  Molly Wall  has presented today for surgery, with the diagnosis of PRIMARY OSTEOARHTRITS OF RIGHT KNEE..  The various methods of treatment have been discussed with the patient and family. After consideration of risks, benefits and other options for treatment, the patient has consented to  Procedure(s): ARTHROPLASTY, KNEE, TOTAL, USING IMAGELESS COMPUTER-ASSISTED NAVIGATION (Right) as a surgical intervention.  The patient's history has been reviewed, patient examined, no change in status, stable for surgery.  I have reviewed the patient's chart and labs.  Questions were answered to the patient's satisfaction.     Anallely Rosell P Keyler Hoge

## 2023-12-14 NOTE — Progress Notes (Signed)
 Patient is not able to walk the distance required to go the bathroom, or he/she is unable to safely negotiate stairs required to access the bathroom.  A 3in1 BSC will alleviate this problem   Amenda Duclos P. Angie Fava M.D.

## 2023-12-15 ENCOUNTER — Other Ambulatory Visit: Payer: Self-pay

## 2023-12-15 ENCOUNTER — Encounter: Payer: Self-pay | Admitting: Orthopedic Surgery

## 2023-12-15 ENCOUNTER — Other Ambulatory Visit: Payer: Self-pay | Admitting: Primary Care

## 2023-12-15 DIAGNOSIS — E039 Hypothyroidism, unspecified: Secondary | ICD-10-CM

## 2023-12-15 DIAGNOSIS — M1711 Unilateral primary osteoarthritis, right knee: Secondary | ICD-10-CM | POA: Diagnosis not present

## 2023-12-15 LAB — GLUCOSE, CAPILLARY
Glucose-Capillary: 276 mg/dL — ABNORMAL HIGH (ref 70–99)
Glucose-Capillary: 277 mg/dL — ABNORMAL HIGH (ref 70–99)
Glucose-Capillary: 302 mg/dL — ABNORMAL HIGH (ref 70–99)

## 2023-12-15 MED ORDER — TRAMADOL HCL 50 MG PO TABS
50.0000 mg | ORAL_TABLET | ORAL | 0 refills | Status: DC | PRN
  Filled 2023-12-15: qty 30, 5d supply, fill #0

## 2023-12-15 MED ORDER — OXYCODONE HCL 5 MG PO TABS
5.0000 mg | ORAL_TABLET | ORAL | 0 refills | Status: DC | PRN
  Filled 2023-12-15: qty 30, 5d supply, fill #0

## 2023-12-15 MED ORDER — CELECOXIB 200 MG PO CAPS
200.0000 mg | ORAL_CAPSULE | Freq: Two times a day (BID) | ORAL | 1 refills | Status: AC
  Filled 2023-12-15 (×2): qty 60, 30d supply, fill #0

## 2023-12-15 MED ORDER — ASPIRIN 81 MG PO CHEW: 81.0000 mg | CHEWABLE_TABLET | Freq: Two times a day (BID) | ORAL | Status: DC

## 2023-12-15 MED ORDER — INSULIN ASPART 100 UNIT/ML IJ SOLN
INTRAMUSCULAR | Status: AC
  Filled 2023-12-15: qty 1

## 2023-12-15 NOTE — Discharge Summary (Signed)
 Physician Discharge Summary  Subjective: 1 Day Post-Op Procedure(s) (LRB): ARTHROPLASTY, KNEE, TOTAL, USING IMAGELESS COMPUTER-ASSISTED NAVIGATION (Right) Patient reports pain as mild.   Patient seen in rounds with Dr. Mardee. Patient is well, and has had no acute complaints or problems Denies any CP, SOB, N/V, fevers or chills We will start therapy today.  Patient is ready to go home  Physician Discharge Summary  Patient ID: Molly Wall MRN: [id number] DOB/AGE: Nov 07, 1945 78 y.o.  Admit date: 12/14/2023 Discharge date: 12/15/2023  Admission Diagnoses:  Discharge Diagnoses:  Principal Problem:   History of total knee arthroplasty, right   Discharged Condition: good  Hospital Course: Patient presented to the hospital on 12/14/2023 for an elective right total knee arthroplasty performed by Dr. Mardee. Patient was given 1g of TXA and 2g of Ancef  prior to the procedure. she tolerated the procedure well without any complications. See procedural note below for details. Postoperatively, the patient did very well. she was able to pass PT protocols on post-op day one without any issues. JP drain was removed without any difficulty and was intact. she was able to void her bladder without any difficulty. Physical exam was unremarkable. she denies any SOB, CP, N/V, fevers or chills. Vital signs are stable. Patient is stable to discharge home.  PROCEDURE:  Right total knee arthroplasty using computer-assisted navigation   SURGEON:  Lynwood SHAUNNA Mardee Mickey. M.D.   ANESTHESIA: spinal   ESTIMATED BLOOD LOSS: 50 mL   FLUIDS REPLACED: 900 mL of crystalloid   TOURNIQUET TIME: 95 minutes   DRAINS: 2 medium Hemovac drains   SOFT TISSUE RELEASES: Anterior cruciate ligament, posterior cruciate ligament, deep medial collateral ligament, patellofemoral ligament   IMPLANTS UTILIZED: DePuy Attune size 5N posterior stabilized femoral component (cemented), size 4 rotating platform tibial  component (cemented), 35 mm medialized dome patella (cemented), and a 10 mm stabilized rotating platform polyethylene insert.    Treatments: none  Discharge Exam: Blood pressure (!) 109/47, pulse 70, temperature 97.8 F (36.6 C), temperature source Oral, resp. rate 15, height 5' 5 (1.651 m), weight 115.6 kg, SpO2 97%.   Disposition: home   Allergies as of 12/15/2023       Reactions   Iodine-131 Anaphylaxis   Pt states when she was 21=she drank an experimental Iodinated Radioisotope for her thyroid . She described it was a small amount in a milk carton in [place] . She states they stopped using it shortly after because the trial wasn't successful. SPM   Prednisone     Make sugar jump way up.        Medication List     STOP taking these medications    albuterol  108 (90 Base) MCG/ACT inhaler Commonly known as: VENTOLIN  HFA   azelastine  0.1 % nasal spray Commonly known as: ASTELIN    BD Pen Needle Nano 2nd Gen 32G X 4 MM Misc Generic drug: Insulin  Pen Needle   cetirizine  10 MG tablet Commonly known as: ZyrTEC  Allergy   Dexcom G7 Sensor Misc   fluticasone  50 MCG/ACT nasal spray Commonly known as: FLONASE    ipratropium 0.03 % nasal spray Commonly known as: ATROVENT    montelukast  10 MG tablet Commonly known as: SINGULAIR    OneTouch Verio test strip Generic drug: glucose blood   PROBIOTIC PO   triamcinolone  cream 0.1 % Commonly known as: KENALOG    TURMERIC PO   V-Go 30 30 UNIT/24HR Kit   Vitamin D  (Ergocalciferol ) 1.25 MG (50000 UNIT) Caps capsule Commonly known as: DRISDOL    Wixela Inhub 250-50 MCG/ACT  Aepb Generic drug: fluticasone -salmeterol       TAKE these medications    acetaminophen  650 MG CR tablet Commonly known as: TYLENOL  Take 1-2 tablets by mouth 3 (three) times daily as needed for pain.   alendronate  70 MG tablet Commonly known as: Fosamax  Take 1 tablet (70 mg total) by mouth once a week. Take with a full glass of water on an empty  stomach.   aspirin  81 MG chewable tablet Chew 1 tablet (81 mg total) by mouth 2 (two) times daily. What changed: Another medication with the same name was added. Make sure you understand how and when to take each.   aspirin  81 MG chewable tablet Chew 1 tablet (81 mg total) by mouth 2 (two) times daily. What changed: You were already taking a medication with the same name, and this prescription was added. Make sure you understand how and when to take each.   atorvastatin  20 MG tablet Commonly known as: LIPITOR TAKE 1 TABLET BY MOUTH EVERY DAY FOR CHOLESTEROL   celecoxib  200 MG capsule Commonly known as: CELEBREX  Take 1 capsule (200 mg total) by mouth 2 (two) times daily.   Glucagon  3 MG/DOSE Powd Place 3 mg into the nose once as needed for up to 1 dose.   insulin  lispro 100 UNIT/ML KwikPen Commonly known as: HumaLOG  KwikPen INJECT 25 UNITS INTO THE SKIN 3 (THREE) TIMES DAILY WITH MEALS   levothyroxine  88 MCG tablet Commonly known as: SYNTHROID  TAKE 1 AND 1/2 TABLET BY MOUTH EVERY SUNDAY. TAKE 1 TABLET BY MOUTH MON THROUGH SAT. TAKE ON AN EMPTY STOMACH WITH WATER ONLY. NO FOOD OR OTHER MEDICATIONS FOR 30 MINUTES.   metFORMIN  500 MG 24 hr tablet Commonly known as: GLUCOPHAGE -XR TAKE 2 TABLETS (1,000 MG TOTAL) BY MOUTH DAILY WITH BREAKFAST. FOR DIABETES.   NovoLOG  100 UNIT/ML injection Generic drug: insulin  aspart INJECT UP TO 70 UNITS INTO THE SKIN ONCE FOR 1 DOSE. VIA PUMP   oxyCODONE  5 MG immediate release tablet Commonly known as: Oxy IR/ROXICODONE  Take 1 tablet (5 mg total) by mouth every 4 (four) hours as needed for moderate pain (pain score 4-6) (pain score 4-6).   Ozempic  (1 MG/DOSE) 4 MG/3ML Sopn Generic drug: Semaglutide  (1 MG/DOSE) INJECT 1 MG INTO THE SKIN ONE TIME PER WEEK   traMADol  50 MG tablet Commonly known as: ULTRAM  Take 1-2 tablets (50-100 mg total) by mouth every 4 (four) hours as needed for moderate pain (pain score 4-6).                Durable Medical Equipment  (From admission, onward)           Start     Ordered   12/14/23 1318  DME Walker rolling  Once       Question:  Patient needs a walker to treat with the following condition  Answer:  Total knee replacement status   12/14/23 1318   12/14/23 1318  DME Bedside commode  Once       Comments: Patient is not able to walk the distance required to go the bathroom, or he/she is unable to safely negotiate stairs required to access the bathroom.  A 3in1 BSC will alleviate this problem  Question:  Patient needs a bedside commode to treat with the following condition  Answer:  Total knee replacement status   12/14/23 1318            Follow-up Information     Drake Chew, PA-C Follow up on 12/29/2023.  Specialty: Orthopedic Surgery Why: at 10:30am Contact information: [address] [place] [address] [phone]         Mardee Lynwood SQUIBB, MD Follow up on 01/28/2024.   Specialty: Orthopedic Surgery Why: at 2:45pm Contact information: [address] [facility] [place] [address] [phone]                 Signed: Sidra Koyanagi 12/15/2023, 8:53 AM   Objective: Vital signs in last 24 hours: Temp:  [96.8 F (36 C)-98.6 F (37 C)] 97.8 F (36.6 C) (09/30 0729) Pulse Rate:  [68-102] 70 (09/30 0729) Resp:  [11-18] 15 (09/30 0729) BP: (101-146)/(47-86) 109/47 (09/30 0729) SpO2:  [93 %-100 %] 97 % (09/30 0729)  Intake/Output from previous day:  Intake/Output Summary (Last 24 hours) at 12/15/2023 0853 Last data filed at 12/15/2023 0423 Gross per 24 hour  Intake 1487.86 ml  Output 1030 ml  Net 457.86 ml    Intake/Output this shift: No intake/output data recorded.  Labs: No results for input(s): HGB in the last 72 hours. No results for input(s): WBC, RBC, HCT, PLT in the last 72 hours. No results for input(s): NA, K, CL, CO2, BUN, CREATININE, GLUCOSE, CALCIUM  in the last 72  hours. No results for input(s): LABPT, INR in the last 72 hours.  EXAM: General - Patient is Alert, Appropriate, and Oriented Extremity - Neurologically intact Neurovascular intact Sensation intact distally Intact pulses distally Dorsiflexion/Plantar flexion intact No cellulitis present Compartment soft Dressing - dressing C/D/I and no drainage Motor Function - intact, moving foot and toes well on exam. JP Drain pulled without difficulty. 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   Breast cancer (HCC)    Diabetes mellitus without complication (HCC)    Family history of breast cancer    Family history of colon cancer    Hypercholesteremia    Hypothyroidism    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   Weight as of this encounter: 115.6 kg.  Patient will continue to work with physical therapy to pass postoperative PT protocols, ROM and strengthening   Discussed with the patient continuing to utilize Polar Care   Patient will use bone foam in 20-30 minute intervals   Patient will wear TED hose bilaterally to help prevent DVT and clot formation   Discussed  the Aquacel bandage.  This bandage will stay in place 7 days postoperatively.  Can be replaced with honeycomb bandages that will be sent home with the patient   Discussed sending the patient home with tramadol  and oxycodone  for as needed pain management.  Patient will also be sent home with Celebrex  to help with swelling and inflammation.  Patient will take an 81 mg aspirin  twice daily for DVT prophylaxis   JP drain removed without difficulty, intact   Weight-Bearing as tolerated to right leg   Patient will follow-up with [facility] clinic orthopedics in 2 weeks for staple removal and reevaluation  Diet - Diabetic diet Follow up - in 2 weeks Activity - WBAT Disposition - Home Condition Upon Discharge - Good DVT Prophylaxis - Aspirin  and TED hose  Fonda CHARLENA Koyanagi, PA-C Orthopaedic Surgery 12/15/2023, 8:53 AM

## 2023-12-15 NOTE — Care Management Obs Status (Signed)
 MEDICARE OBSERVATION STATUS NOTIFICATION   Patient Details  Name: Molly Wall MRN: [id number] Date of Birth: [birth year]   Medicare Observation Status Notification Given:  Yes    Rojelio SHAUNNA Rattler 12/15/2023, 10:49 AM

## 2023-12-15 NOTE — Anesthesia Postprocedure Evaluation (Signed)
 Anesthesia Post Note  Patient: Molly Wall  Procedure(s) Performed: ARTHROPLASTY, KNEE, TOTAL, USING IMAGELESS COMPUTER-ASSISTED NAVIGATION (Right: Knee)  Patient location during evaluation: Nursing Unit Anesthesia Type: Spinal Level of consciousness: awake Pain management: pain level controlled Respiratory status: spontaneous breathing Cardiovascular status: stable Postop Assessment: no headache Anesthetic complications: no   No notable events documented.   Last Vitals:  Vitals:   12/15/23 0528 12/15/23 0729  BP: (!) 111/58 (!) 109/47  Pulse: 68 70  Resp: 16 15  Temp: 36.7 C 36.6 C  SpO2: 99% 97%    Last Pain:  Vitals:   12/15/23 0729  TempSrc: Oral  PainSc: 0-No pain                 Shona Earnie Fare

## 2023-12-15 NOTE — Progress Notes (Signed)
 Subjective: 1 Day Post-Op Procedure(s) (LRB): ARTHROPLASTY, KNEE, TOTAL, USING IMAGELESS COMPUTER-ASSISTED NAVIGATION (Right) Patient reports pain as mild.   Patient seen in rounds with Dr. Mardee. Patient is well, and has had no acute complaints or problems Denies any CP, SOB, N/V, fevers or chills We will start therapy today.  Plan is to go Home after hospital stay.  Objective: Vital signs in last 24 hours: Temp:  [96.8 F (36 C)-98.6 F (37 C)] 97.8 F (36.6 C) (09/30 0729) Pulse Rate:  [68-102] 70 (09/30 0729) Resp:  [11-18] 15 (09/30 0729) BP: (101-146)/(47-86) 109/47 (09/30 0729) SpO2:  [93 %-100 %] 97 % (09/30 0729)  Intake/Output from previous day:  Intake/Output Summary (Last 24 hours) at 12/15/2023 0818 Last data filed at 12/15/2023 0423 Gross per 24 hour  Intake 1487.86 ml  Output 1030 ml  Net 457.86 ml    Intake/Output this shift: No intake/output data recorded.  Labs: No results for input(s): HGB in the last 72 hours. No results for input(s): WBC, RBC, HCT, PLT in the last 72 hours. No results for input(s): NA, K, CL, CO2, BUN, CREATININE, GLUCOSE, CALCIUM  in the last 72 hours. No results for input(s): LABPT, INR in the last 72 hours.  EXAM General - Patient is Alert, Appropriate, and Oriented Extremity - Neurologically intact Neurovascular intact Sensation intact distally Intact pulses distally Dorsiflexion/Plantar flexion intact No cellulitis present Compartment soft Dressing - dressing C/D/I and no drainage Motor Function - intact, moving foot and toes well on exam. JP Drain pulled without difficulty. 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   Weight as of this encounter: 115.6 kg. Advance diet Up with therapy  Patient will continue to work with physical therapy to pass postoperative PT protocols, ROM and strengthening  Discussed with the patient continuing to utilize Polar Care  Patient will use bone foam in 20-30 minute intervals  Patient will wear TED hose bilaterally to help prevent DVT and clot formation  Discussed the Aquacel bandage.  This bandage will stay in place 7 days postoperatively.  Can be replaced with honeycomb bandages that will be sent home with the patient  Discussed sending the patient home with tramadol  and oxycodone  for as needed pain management.  Patient will also be sent home with Celebrex  to help with swelling and inflammation.  Patient will take an 81 mg aspirin  twice daily  for DVT prophylaxis  JP drain removed without difficulty, intact  Weight-Bearing as tolerated to right leg  Patient will follow-up with [facility] clinic orthopedics in 2 weeks for staple removal and reevaluation  Fonda Koyanagi, PA-C [facility] 12/15/2023, 8:18 AM

## 2023-12-15 NOTE — Plan of Care (Signed)
   Problem: Fluid Volume: Goal: Ability to maintain a balanced intake and output will improve Outcome: Progressing

## 2023-12-15 NOTE — Plan of Care (Signed)
   Problem: Activity: Goal: Ability to avoid complications of mobility impairment will improve Outcome: Progressing   Problem: Pain Management: Goal: Pain level will decrease with appropriate interventions Outcome: Progressing

## 2023-12-15 NOTE — Progress Notes (Signed)
 DISCHARGE NOTE:  Pt and daughter given discharge instructions. TED hose on both legs. Beds to meds delivered medications and sent with pt. 2 honeycomb dressings also sent. Pt wheeled to car by staff, daughter providing transportation home.

## 2023-12-15 NOTE — Progress Notes (Signed)
 Physical Therapy Treatment Patient Details Name: Molly Wall MRN: [id number] DOB: [birth year] Today's Date: 12/15/2023   History of Present Illness Pt is a 78 y.o. female admitted for acute hospitalization s/p R TKA, 12/14/23, WBAT.    PT Comments  Pt had just finished OT session upon start of POT session. Pt does endorse 4-5/10 pain however also endorses feel medicine kicking in. Pt was slightly lethargic throughout session but able to follow commands consistently throughout. Pt's support daughter was present and will be assisting pt at DC. Pt was able to demonstrate safe abilities to stand to RW and tolerate ambulation. Once back to room, reviewed importance of polar care use, stretching, HEP handout issued, and discussed post acute DC expectations. Pt demonstrated AAROM 2-88 degrees after performing stretching 3 x hold ~ 10 sec. Pt was seated on toilet(BSC) with RN aware at conclusion of session. Pt is cleared from an acute PT standpoint for safe DC home with HHPT to follow.     If plan is discharge home, recommend the following: A little help with walking and/or transfers;A little help with bathing/dressing/bathroom     Equipment Recommendations  None recommended by PT (pt has all DME needs met form last TKA)       Precautions / Restrictions Precautions Precautions: Fall Recall of Precautions/Restrictions: Intact Restrictions Weight Bearing Restrictions Per Provider Order: Yes RLE Weight Bearing Per Provider Order: Weight bearing as tolerated     Mobility  Bed Mobility  General bed mobility comments: In recliner pre/post session    Transfers Overall transfer level: Needs assistance Equipment used: Rolling walker (2 wheels) Transfers: Sit to/from Stand Sit to Stand: Supervision  General transfer comment: vcs for technique improvements only    Ambulation/Gait Ambulation/Gait assistance: Supervision Gait Distance (Feet): 150 Feet Assistive device: Rolling  walker (2 wheels) Gait Pattern/deviations: Step-through pattern, Antalgic, Decreased stance time - left, Decreased step length - left Gait velocity: WNL  General Gait Details: pt demonstrated safe steady gait kinematics wihtout LOB or safety concerns   Stairs Stairs:  (pt does not have formal stairs to enter home howeber was educated on proper sequencing. Both pt and daughter state understanding and confidenece in abilities to perform if needed.)     Balance Overall balance assessment: Needs assistance Sitting-balance support: No upper extremity supported, Feet supported Sitting balance-Leahy Scale: Good     Standing balance support: Bilateral upper extremity supported, During functional activity, Reliant on assistive device for balance Standing balance-Leahy Scale: Good       Communication Communication Communication: No apparent difficulties  Cognition Arousal: Alert Behavior During Therapy: WFL for tasks assessed/performed   PT - Cognitive impairments: No apparent impairments    PT - Cognition Comments: pt is A and O x 4. slightly lethargic from previously issued pain medications (~45 minutes prior) Following commands: Intact      Cueing Cueing Techniques: Verbal cues  Exercises Total Joint Exercises Goniometric ROM: 2-88    General Comments General comments (skin integrity, edema, etc.): Chartered loss adjuster issued HEP and educated pt on importance of stretching, routine mobility, polar care use, positioning, and post acute expectations      Pertinent Vitals/Pain Pain Assessment Pain Assessment: 0-10 Pain Score: 5  Pain Location: R knee Pain Intervention(s): Limited activity within patient's tolerance, Monitored during session, Premedicated before session, Repositioned, Ice applied    Home Living Family/patient expects to be discharged to:: Private residence Living Arrangements: Alone Available Help at Discharge: Family;Available 24 hours/day Type of Home: House Home Access:  Level entry       Home Layout: Two level;Able to live on main level with bedroom/bathroom Home Equipment: Rolling Walker (2 wheels);BSC/3in1          PT Goals (current goals can now be found in the care plan section) Acute Rehab PT Goals Patient Stated Goal: go home Progress towards PT goals: Progressing toward goals    Frequency    BID       AM-PAC PT 6 Clicks Mobility   Outcome Measure  Help needed turning from your back to your side while in a flat bed without using bedrails?: A Little Help needed moving from lying on your back to sitting on the side of a flat bed without using bedrails?: A Little Help needed moving to and from a bed to a chair (including a wheelchair)?: A Little Help needed standing up from a chair using your arms (e.g., wheelchair or bedside chair)?: A Little Help needed to walk in hospital room?: A Little Help needed climbing 3-5 steps with a railing? : A Little 6 Click Score: 18    End of Session   Activity Tolerance: Patient tolerated treatment well Patient left: in chair;with call bell/phone within reach;with family/visitor present Nurse Communication: Mobility status PT Visit Diagnosis: Muscle weakness (generalized) (M62.81);Difficulty in walking, not elsewhere classified (R26.2);Pain Pain - Right/Left: Right Pain - part of body: Knee     Time: 8971-8958 PT Time Calculation (min) (ACUTE ONLY): 13 min  Charges:    $Therapeutic Activity: 8-22 mins PT General Charges $$ ACUTE PT VISIT: 1 Visit                     Rankin Essex PTA 12/15/23, 10:57 AM

## 2023-12-15 NOTE — Evaluation (Signed)
 Occupational Therapy Evaluation Patient Details Name: Molly Wall MRN: [id number] DOB: Dec 05, 1945 Today's Date: 12/15/2023   History of Present Illness   Pt is a 78 y.o. female admitted for acute hospitalization s/p R TKA, 12/14/23, WBAT.     Clinical Impressions Pt seen for OT evaluation this date, POD#1 from above surgery. Pt was independent in all ADLs prior to surgery, however occasionally using SPC or RW for mobility due to R knee pain. Pt is eager to return to PLOF with less pain and improved safety and independence. Pt currently requires supervision - (MAX A for shoes, has long handled shoe horn at home) assist for LB dressing while in seated position due to pain and limited AROM of R knee. Pt instructed in polar care mgt, falls prevention strategies, home/routines modifications, DME/AE for LB bathing and dressing tasks, and compression stocking mgt. Pt would benefit from skilled OT services including additional instruction in dressing techniques with or without assistive devices for dressing and bathing skills to support recall and carryover prior to discharge and ultimately to maximize safety, independence, and minimize falls risk and caregiver burden. Do not currently anticipate any OT needs following this hospitalization.        If plan is discharge home, recommend the following:   A little help with walking and/or transfers;A little help with bathing/dressing/bathroom;Assist for transportation;Help with stairs or ramp for entrance     Functional Status Assessment   Patient has had a recent decline in their functional status and demonstrates the ability to make significant improvements in function in a reasonable and predictable amount of time.     Equipment Recommendations   None recommended by OT      Precautions/Restrictions   Precautions Precautions: Fall Restrictions Weight Bearing Restrictions Per Provider Order: Yes RLE Weight Bearing Per  Provider Order: Weight bearing as tolerated     Mobility Bed Mobility Overal bed mobility: Needs Assistance Bed Mobility: Supine to Sit, Sit to Supine     Supine to sit: Supervision Sit to supine: Supervision        Transfers Overall transfer level: Needs assistance Equipment used: Rolling walker (2 wheels) Transfers: Sit to/from Stand, Bed to chair/wheelchair/BSC Sit to Stand: Supervision, From elevated surface     Step pivot transfers: Supervision            Balance Overall balance assessment: Needs assistance Sitting-balance support: No upper extremity supported, Feet supported Sitting balance-Leahy Scale: Good     Standing balance support: Bilateral upper extremity supported Standing balance-Leahy Scale: Fair                             ADL either performed or assessed with clinical judgement   ADL Overall ADL's : Needs assistance/impaired                 Upper Body Dressing : Sitting;Cueing for safety;Cueing for sequencing;Supervision/safety Upper Body Dressing Details (indicate cue type and reason): cues to perform from a seated position vs standing with no UE support Lower Body Dressing: Supervision/safety;Sit to/from stand;Cueing for sequencing;Cueing for safety;Maximal assistance Lower Body Dressing Details (indicate cue type and reason): dons underwear and pants supervision level w/ lateral leans to don over feet; cues for compensatory strategies and to don shoes prior to standing for falls prevention. with RW, pt performs STS supervision level, vcs for safety as pt attempts to walk forward without donning pants over hips. maxA to don shoes Toilet Transfer:  Supervision/safety;Cueing for safety;Cueing for sequencing;Ambulation;Rolling walker (2 wheels);BSC/3in1 Toilet Transfer Details (indicate cue type and reason): cues for sequencing, bari BSC over toilet Toileting- Clothing Manipulation and Hygiene: Supervision/safety;Cueing for  safety;Cueing for sequencing       Functional mobility during ADLs: Supervision/safety;Cueing for sequencing;Cueing for safety;Rolling walker (2 wheels) General ADL Comments: t/f bathroom supervision level      Pertinent Vitals/Pain Pain Assessment Pain Assessment: Faces Faces Pain Scale: Hurts even more Pain Location: R knee Pain Descriptors / Indicators: Aching Pain Intervention(s): Limited activity within patient's tolerance, Monitored during session     Extremity/Trunk Assessment Upper Extremity Assessment Upper Extremity Assessment: Overall WFL for tasks assessed   Lower Extremity Assessment Lower Extremity Assessment: Defer to PT evaluation   Cervical / Trunk Assessment Cervical / Trunk Assessment: Normal   Communication Communication Communication: No apparent difficulties   Cognition Arousal: Alert Behavior During Therapy: WFL for tasks assessed/performed Cognition: No apparent impairments                               Following commands: Intact       Cueing  General Comments   Cueing Techniques: Verbal cues      Exercises Other Exercises Other Exercises: bilat TED hose donned, edu on IceMan machine   Shoulder Instructions      Home Living Family/patient expects to be discharged to:: Private residence Living Arrangements: Alone Available Help at Discharge: Family;Available 24 hours/day Type of Home: House Home Access: Level entry     Home Layout: Two level;Able to live on main level with bedroom/bathroom     Bathroom Shower/Tub: Tub/shower unit;Walk-in shower   Bathroom Toilet: Standard Bathroom Accessibility: Yes   Home Equipment: Agricultural consultant (2 wheels);BSC/3in1          Prior Functioning/Environment Prior Level of Function : Independent/Modified Independent             Mobility Comments: Mod indep with use of SPC vs RW; + driving; denies fall history. ADLs Comments: independent    OT Problem List: Decreased  strength;Decreased range of motion;Decreased activity tolerance;Impaired balance (sitting and/or standing);Pain;Decreased safety awareness;Decreased knowledge of use of DME or AE;Decreased knowledge of precautions   OT Treatment/Interventions: Self-care/ADL training;Therapeutic exercise;Neuromuscular education;Energy conservation;DME and/or AE instruction;Therapeutic activities;Patient/family education;Balance training      OT Goals(Current goals can be found in the care plan section)   Acute Rehab OT Goals OT Goal Formulation: With patient/family Time For Goal Achievement: 12/29/23 Potential to Achieve Goals: Good   OT Frequency:  Min 2X/week       AM-PAC OT 6 Clicks Daily Activity     Outcome Measure Help from another person eating meals?: None Help from another person taking care of personal grooming?: None Help from another person toileting, which includes using toliet, bedpan, or urinal?: A Little Help from another person bathing (including washing, rinsing, drying)?: A Little Help from another person to put on and taking off regular upper body clothing?: None Help from another person to put on and taking off regular lower body clothing?: A Little 6 Click Score: 21   End of Session Equipment Utilized During Treatment: Rolling walker (2 wheels) Nurse Communication: Mobility status;Patient requests pain meds  Activity Tolerance: Patient tolerated treatment well Patient left: in chair;with family/visitor present  OT Visit Diagnosis: Unsteadiness on feet (R26.81);Muscle weakness (generalized) (M62.81);Pain;Other abnormalities of gait and mobility (R26.89) Pain - Right/Left: Right Pain - part of body: Knee  Time: 9056-8972 OT Time Calculation (min): 44 min Charges:  OT General Charges $OT Visit: 1 Visit OT Evaluation $OT Eval Low Complexity: 1 Low OT Treatments $Self Care/Home Management : 23-37 mins  Pritesh Sobecki L. Eilee Schader, OTR/L  12/15/23, 10:51 AM

## 2023-12-15 NOTE — Progress Notes (Signed)
 OT Cancellation Note  Patient Details Name: Molly Wall MRN: [id number] DOB: 1946/01/16   Cancelled Treatment:    Reason Eval/Treat Not Completed: Other (comment). Pt received in bed with daughter present, requesting OT to return after breakfast. OT will return as able.  Jahzier Villalon L. Elyshia Kumagai, OTR/L  12/15/23, 9:02 AM

## 2023-12-15 NOTE — Inpatient Diabetes Management (Signed)
 Inpatient Diabetes Program Recommendations  AACE/ADA: New Consensus Statement on Inpatient Glycemic Control (2015)  Target Ranges:  Prepandial:   less than 140 mg/dL      Peak postprandial:   less than 180 mg/dL (1-2 hours)      Critically ill patients:  140 - 180 mg/dL    Latest Reference Range & Units 12/14/23 06:39 12/14/23 11:30 12/14/23 14:47 12/14/23 16:54 12/14/23 19:27 12/14/23 22:14  Glucose-Capillary 70 - 99 mg/dL 863 (H) 737 (H) 622 (H)  10 units Novolog  @1538   30 units Lantus  @1600  440 (H)  6 units Novolog   539 (HH)  15 units Novolog   385 (H)  (HH): Data is critically high (H): Data is abnormally high  Latest Reference Range & Units 12/14/23 23:49 12/15/23 04:14 12/15/23 07:28  Glucose-Capillary 70 - 99 mg/dL 658 (H)  11 units Novolog   276 (H)  8 units Novolog   277 (H)  14 units Novolog    (H): Data is abnormally high   Admit for R Total Knee   History: DM   Home DM Meds: Dexcom G7 CGM                             VGO-30 Insulin  Pump                             Humalog  25 units TID with meals                             Ozempic  1 mg Qweek                             Metformin  1000 mg daily (NOT taking)                             Looks like pt now using OmniPod 5 Insulin  Pump   Current Orders: Novolog  Moderate Correction Scale/ SSI (0-15 units) Q4H     Novolog  6 units TID with meals     Lantus  30 units daily    Taken off her Insulin  pump yest afternoon per Dtrs request SQ Insulin  orders reviewed and placed with Dr. Mardee   Met w/ pt and Dtr prior to d/c.  Explained to pt and Dtr that pt got 30 units Lantus  this AM.  Asked pt to give herself her normal doses of Humalog  with meals when she goes home with her Humalog  insulin  pen--She gives 6 clicks with Lunch so asked pt to give 12 units Humalog  with her Lunch today and she gives 7 clicks for dinner so asked pt to give herself 14 units Humalog  with dinner.  Asked pt to restart her Vgo 30 insulin  pump  patch tomorrow AM.  We also reviewed proper HYPO treatment at home.  Encouraged pt to drink 4oz juice or regular soda or take glucose tablets versus eating full sized snickers bar (which is what she has been using for HYPO at home).  Pt appreciative of visit and agreeable to plan.       --Will follow patient during hospitalization--  Adina Rudolpho Arrow RN, MSN, CDCES Diabetes Coordinator Inpatient Glycemic Control Team Team Pager: [phone] (8a-5p)

## 2023-12-17 ENCOUNTER — Ambulatory Visit (INDEPENDENT_AMBULATORY_CARE_PROVIDER_SITE_OTHER): Payer: Self-pay

## 2023-12-21 ENCOUNTER — Ambulatory Visit: Payer: Self-pay

## 2023-12-21 ENCOUNTER — Emergency Department

## 2023-12-21 ENCOUNTER — Encounter: Payer: Self-pay | Admitting: Internal Medicine

## 2023-12-21 ENCOUNTER — Other Ambulatory Visit: Payer: Self-pay

## 2023-12-21 ENCOUNTER — Inpatient Hospital Stay
Admission: EM | Admit: 2023-12-21 | Discharge: 2023-12-23 | DRG: 871 | Disposition: A | Discharge status: Home Health | Attending: Internal Medicine | Admitting: Internal Medicine

## 2023-12-21 ENCOUNTER — Ambulatory Visit: Admitting: Family Medicine

## 2023-12-21 DIAGNOSIS — Z888 Allergy status to other drugs, medicaments and biological substances status: Secondary | ICD-10-CM | POA: Diagnosis not present

## 2023-12-21 DIAGNOSIS — D649 Anemia, unspecified: Secondary | ICD-10-CM | POA: Diagnosis present

## 2023-12-21 DIAGNOSIS — T402X5A Adverse effect of other opioids, initial encounter: Secondary | ICD-10-CM | POA: Diagnosis present

## 2023-12-21 DIAGNOSIS — R339 Retention of urine, unspecified: Secondary | ICD-10-CM | POA: Diagnosis present

## 2023-12-21 DIAGNOSIS — Z8249 Family history of ischemic heart disease and other diseases of the circulatory system: Secondary | ICD-10-CM

## 2023-12-21 DIAGNOSIS — Z791 Long term (current) use of non-steroidal anti-inflammatories (NSAID): Secondary | ICD-10-CM

## 2023-12-21 DIAGNOSIS — Z79890 Hormone replacement therapy (postmenopausal): Secondary | ICD-10-CM

## 2023-12-21 DIAGNOSIS — Z7984 Long term (current) use of oral hypoglycemic drugs: Secondary | ICD-10-CM | POA: Diagnosis not present

## 2023-12-21 DIAGNOSIS — R1030 Lower abdominal pain, unspecified: Secondary | ICD-10-CM | POA: Diagnosis present

## 2023-12-21 DIAGNOSIS — K5289 Other specified noninfective gastroenteritis and colitis: Secondary | ICD-10-CM | POA: Diagnosis present

## 2023-12-21 DIAGNOSIS — R338 Other retention of urine: Secondary | ICD-10-CM | POA: Diagnosis present

## 2023-12-21 DIAGNOSIS — N139 Obstructive and reflux uropathy, unspecified: Secondary | ICD-10-CM | POA: Diagnosis present

## 2023-12-21 DIAGNOSIS — A419 Sepsis, unspecified organism: Principal | ICD-10-CM | POA: Diagnosis present

## 2023-12-21 DIAGNOSIS — Z96653 Presence of artificial knee joint, bilateral: Secondary | ICD-10-CM | POA: Diagnosis present

## 2023-12-21 DIAGNOSIS — J45909 Unspecified asthma, uncomplicated: Secondary | ICD-10-CM | POA: Diagnosis present

## 2023-12-21 DIAGNOSIS — Z7982 Long term (current) use of aspirin: Secondary | ICD-10-CM | POA: Diagnosis not present

## 2023-12-21 DIAGNOSIS — Z7985 Long-term (current) use of injectable non-insulin antidiabetic drugs: Secondary | ICD-10-CM | POA: Diagnosis not present

## 2023-12-21 DIAGNOSIS — E1110 Type 2 diabetes mellitus with ketoacidosis without coma: Secondary | ICD-10-CM

## 2023-12-21 DIAGNOSIS — Z794 Long term (current) use of insulin: Secondary | ICD-10-CM | POA: Diagnosis not present

## 2023-12-21 DIAGNOSIS — E7800 Pure hypercholesterolemia, unspecified: Secondary | ICD-10-CM | POA: Diagnosis present

## 2023-12-21 DIAGNOSIS — Z79899 Other long term (current) drug therapy: Secondary | ICD-10-CM

## 2023-12-21 DIAGNOSIS — G4733 Obstructive sleep apnea (adult) (pediatric): Secondary | ICD-10-CM | POA: Diagnosis present

## 2023-12-21 DIAGNOSIS — K219 Gastro-esophageal reflux disease without esophagitis: Secondary | ICD-10-CM | POA: Diagnosis present

## 2023-12-21 DIAGNOSIS — Z6841 Body Mass Index (BMI) 40.0 and over, adult: Secondary | ICD-10-CM

## 2023-12-21 DIAGNOSIS — E039 Hypothyroidism, unspecified: Secondary | ICD-10-CM | POA: Diagnosis present

## 2023-12-21 DIAGNOSIS — G8929 Other chronic pain: Secondary | ICD-10-CM | POA: Diagnosis present

## 2023-12-21 DIAGNOSIS — K5903 Drug induced constipation: Secondary | ICD-10-CM | POA: Diagnosis present

## 2023-12-21 DIAGNOSIS — Z7983 Long term (current) use of bisphosphonates: Secondary | ICD-10-CM | POA: Diagnosis not present

## 2023-12-21 DIAGNOSIS — Z8261 Family history of arthritis: Secondary | ICD-10-CM

## 2023-12-21 DIAGNOSIS — G9341 Metabolic encephalopathy: Secondary | ICD-10-CM | POA: Diagnosis present

## 2023-12-21 DIAGNOSIS — Z803 Family history of malignant neoplasm of breast: Secondary | ICD-10-CM

## 2023-12-21 DIAGNOSIS — M5416 Radiculopathy, lumbar region: Secondary | ICD-10-CM | POA: Diagnosis present

## 2023-12-21 DIAGNOSIS — Z853 Personal history of malignant neoplasm of breast: Secondary | ICD-10-CM

## 2023-12-21 DIAGNOSIS — K5730 Diverticulosis of large intestine without perforation or abscess without bleeding: Secondary | ICD-10-CM | POA: Diagnosis present

## 2023-12-21 DIAGNOSIS — Z825 Family history of asthma and other chronic lower respiratory diseases: Secondary | ICD-10-CM

## 2023-12-21 LAB — CBC WITH DIFFERENTIAL/PLATELET
Abs Immature Granulocytes: 0.04 K/uL (ref 0.00–0.07)
Basophils Absolute: 0 K/uL (ref 0.0–0.1)
Basophils Relative: 0 %
Eosinophils Absolute: 0 K/uL (ref 0.0–0.5)
Eosinophils Relative: 0 %
HCT: 36 % (ref 36.0–46.0)
Hemoglobin: 11.3 g/dL — ABNORMAL LOW (ref 12.0–15.0)
Immature Granulocytes: 0 %
Lymphocytes Relative: 2 %
Lymphs Abs: 0.3 K/uL — ABNORMAL LOW (ref 0.7–4.0)
MCH: 27 pg (ref 26.0–34.0)
MCHC: 31.4 g/dL (ref 30.0–36.0)
MCV: 86.1 fL (ref 80.0–100.0)
Monocytes Absolute: 0.8 K/uL (ref 0.1–1.0)
Monocytes Relative: 5 %
Neutro Abs: 14.2 K/uL — ABNORMAL HIGH (ref 1.7–7.7)
Neutrophils Relative %: 93 %
Platelets: 294 K/uL (ref 150–400)
RBC: 4.18 MIL/uL (ref 3.87–5.11)
RDW: 14.7 % (ref 11.5–15.5)
Smear Review: NORMAL
WBC: 15.3 K/uL — ABNORMAL HIGH (ref 4.0–10.5)
nRBC: 0 % (ref 0.0–0.2)

## 2023-12-21 LAB — BASIC METABOLIC PANEL WITH GFR
Anion gap: 17 — ABNORMAL HIGH (ref 5–15)
BUN: 15 mg/dL (ref 8–23)
CO2: 19 mmol/L — ABNORMAL LOW (ref 22–32)
Calcium: 8.6 mg/dL — ABNORMAL LOW (ref 8.9–10.3)
Chloride: 97 mmol/L — ABNORMAL LOW (ref 98–111)
Creatinine, Ser: 0.64 mg/dL (ref 0.44–1.00)
GFR, Estimated: >60 mL/min (ref >60)
Glucose, Bld: 223 mg/dL — ABNORMAL HIGH (ref 70–99)
Potassium: 4.3 mmol/L (ref 3.5–5.1)
Sodium: 133 mmol/L — ABNORMAL LOW (ref 135–145)

## 2023-12-21 LAB — BLOOD GAS, VENOUS
Acid-base deficit: 8.1 mmol/L — ABNORMAL HIGH (ref 0.0–2.0)
Bicarbonate: 17.8 mmol/L — ABNORMAL LOW (ref 20.0–28.0)
O2 Saturation: 77.9 %
Patient temperature: 37
pCO2, Ven: 37 mmHg — ABNORMAL LOW (ref 44–60)
pH, Ven: 7.29 (ref 7.25–7.43)
pO2, Ven: 47 mmHg — ABNORMAL HIGH (ref 32–45)

## 2023-12-21 LAB — MRSA NEXT GEN BY PCR, NASAL: MRSA by PCR Next Gen: NOT DETECTED

## 2023-12-21 LAB — COMPREHENSIVE METABOLIC PANEL WITH GFR
ALT: 17 U/L (ref 0–44)
AST: 24 U/L (ref 15–41)
Albumin: 3.7 g/dL (ref 3.5–5.0)
Alkaline Phosphatase: 105 U/L (ref 38–126)
Anion gap: 18 — ABNORMAL HIGH (ref 5–15)
BUN: 19 mg/dL (ref 8–23)
CO2: 20 mmol/L — ABNORMAL LOW (ref 22–32)
Calcium: 9 mg/dL (ref 8.9–10.3)
Chloride: 90 mmol/L — ABNORMAL LOW (ref 98–111)
Creatinine, Ser: 0.93 mg/dL (ref 0.44–1.00)
GFR, Estimated: >60 mL/min (ref >60)
Glucose, Bld: 408 mg/dL — ABNORMAL HIGH (ref 70–99)
Potassium: 5.5 mmol/L — ABNORMAL HIGH (ref 3.5–5.1)
Sodium: 128 mmol/L — ABNORMAL LOW (ref 135–145)
Total Bilirubin: 2 mg/dL — ABNORMAL HIGH (ref 0.0–1.2)
Total Protein: 6.9 g/dL (ref 6.5–8.1)

## 2023-12-21 LAB — GLUCOSE, CAPILLARY
Glucose-Capillary: 243 mg/dL — ABNORMAL HIGH (ref 70–99)
Glucose-Capillary: 145 mg/dL — ABNORMAL HIGH (ref 70–99)
Glucose-Capillary: 120 mg/dL — ABNORMAL HIGH (ref 70–99)

## 2023-12-21 LAB — CBC
HCT: 35.2 % — ABNORMAL LOW (ref 36.0–46.0)
Hemoglobin: 11.2 g/dL — ABNORMAL LOW (ref 12.0–15.0)
MCH: 27.4 pg (ref 26.0–34.0)
MCHC: 31.8 g/dL (ref 30.0–36.0)
MCV: 86.1 fL (ref 80.0–100.0)
Platelets: 275 K/uL (ref 150–400)
RBC: 4.09 MIL/uL (ref 3.87–5.11)
RDW: 14.6 % (ref 11.5–15.5)
WBC: 15.5 K/uL — ABNORMAL HIGH (ref 4.0–10.5)
nRBC: 0 % (ref 0.0–0.2)

## 2023-12-21 LAB — URINALYSIS, ROUTINE W REFLEX MICROSCOPIC
Bacteria, UA: NONE SEEN
Bilirubin Urine: NEGATIVE
Glucose, UA: >=500 mg/dL — AB
Ketones, ur: 80 mg/dL — AB
Leukocytes,Ua: NEGATIVE
Nitrite: NEGATIVE
Protein, ur: NEGATIVE mg/dL
Specific Gravity, Urine: 1.02 (ref 1.005–1.030)
Squamous Epithelial / HPF: 0 /HPF (ref 0–5)
pH: 5 (ref 5.0–8.0)

## 2023-12-21 LAB — AMMONIA: Ammonia: <13 umol/L (ref 9–35)

## 2023-12-21 LAB — CBG MONITORING, ED
Glucose-Capillary: 413 mg/dL — ABNORMAL HIGH (ref 70–99)
Glucose-Capillary: 357 mg/dL — ABNORMAL HIGH (ref 70–99)
Glucose-Capillary: 333 mg/dL — ABNORMAL HIGH (ref 70–99)
Glucose-Capillary: 306 mg/dL — ABNORMAL HIGH (ref 70–99)

## 2023-12-21 LAB — BETA-HYDROXYBUTYRIC ACID
Beta-Hydroxybutyric Acid: 5.35 mmol/L — ABNORMAL HIGH (ref 0.05–0.27)
Beta-Hydroxybutyric Acid: 2.29 mmol/L — ABNORMAL HIGH (ref 0.05–0.27)

## 2023-12-21 LAB — LACTIC ACID, PLASMA
Lactic Acid, Venous: 1.8 mmol/L (ref 0.5–1.9)
Lactic Acid, Venous: 3.1 mmol/L (ref 0.5–1.9)

## 2023-12-21 MED ORDER — ENOXAPARIN SODIUM 60 MG/0.6ML IJ SOSY
58.0000 mg | PREFILLED_SYRINGE | INTRAMUSCULAR | Status: DC
  Administered 2023-12-21: 58 mg via SUBCUTANEOUS
  Filled 2023-12-21: qty 0.6

## 2023-12-21 MED ORDER — INSULIN REGULAR(HUMAN) IN NACL 100-0.9 UT/100ML-% IV SOLN
INTRAVENOUS | Status: DC
Start: 2023-12-21 — End: 2023-12-22
  Administered 2023-12-21: 15 [IU]/h via INTRAVENOUS
  Administered 2023-12-22: 3.2 [IU]/h via INTRAVENOUS
  Filled 2023-12-21 (×2): qty 100

## 2023-12-21 MED ORDER — METAMUCIL SMOOTH TEXTURE 58.6 % PO POWD: 1.0000 | Freq: Three times a day (TID) | ORAL | 12 refills | Status: DC

## 2023-12-21 MED ORDER — DULCOLAX 5 MG PO TBEC: 5.0000 mg | DELAYED_RELEASE_TABLET | Freq: Every day | ORAL | 1 refills | Status: DC | PRN

## 2023-12-21 MED ORDER — SODIUM CHLORIDE 0.9 % IV BOLUS
1000.0000 mL | Freq: Once | INTRAVENOUS | Status: AC
  Administered 2023-12-21: 1000 mL via INTRAVENOUS

## 2023-12-21 MED ORDER — SENNOSIDES-DOCUSATE SODIUM 8.6-50 MG PO TABS
1.0000 | ORAL_TABLET | Freq: Two times a day (BID) | ORAL | Status: DC
  Administered 2023-12-21 – 2023-12-22 (×3): 1 via ORAL
  Filled 2023-12-21 (×4): qty 1

## 2023-12-21 MED ORDER — DEXTROSE 50 % IV SOLN: 0.0000 mL | INTRAVENOUS | Status: DC | PRN

## 2023-12-21 MED ORDER — SODIUM CHLORIDE 0.9 % IV SOLN: Freq: Once | INTRAVENOUS | Status: AC

## 2023-12-21 MED ORDER — FLEET ENEMA RE ENEM
1.0000 | ENEMA | Freq: Once | RECTAL | Status: AC
  Administered 2023-12-21: 1 via RECTAL

## 2023-12-21 MED ORDER — BISACODYL 10 MG RE SUPP: 10.0000 mg | Freq: Every day | RECTAL | Status: DC | PRN

## 2023-12-21 MED ORDER — ACETAMINOPHEN 325 MG PO TABS
650.0000 mg | ORAL_TABLET | Freq: Once | ORAL | Status: AC
  Administered 2023-12-21: 650 mg via ORAL
  Filled 2023-12-21 (×2): qty 2

## 2023-12-21 MED ORDER — CEFTRIAXONE SODIUM 2 G IJ SOLR
2.0000 g | INTRAMUSCULAR | Status: DC
  Administered 2023-12-22: 2 g via INTRAVENOUS
  Filled 2023-12-21 (×2): qty 20

## 2023-12-21 MED ORDER — DEXTROSE IN LACTATED RINGERS 5 % IV SOLN
INTRAVENOUS | Status: AC
Start: 2023-12-21 — End: 2023-12-22

## 2023-12-21 MED ORDER — ORAL CARE MOUTH RINSE: 15.0000 mL | OROMUCOSAL | Status: DC | PRN

## 2023-12-21 MED ORDER — POLYETHYLENE GLYCOL 3350 17 G PO PACK
17.0000 g | PACK | Freq: Two times a day (BID) | ORAL | Status: DC
  Administered 2023-12-21 – 2023-12-22 (×3): 17 g via ORAL
  Filled 2023-12-21 (×4): qty 1

## 2023-12-21 MED ORDER — METRONIDAZOLE 500 MG/100ML IV SOLN
500.0000 mg | Freq: Once | INTRAVENOUS | Status: AC
  Administered 2023-12-21: 500 mg via INTRAVENOUS
  Filled 2023-12-21 (×2): qty 100

## 2023-12-21 MED ORDER — SODIUM CHLORIDE 0.9 % IV SOLN
2.0000 g | Freq: Once | INTRAVENOUS | Status: AC
  Administered 2023-12-21: 2 g via INTRAVENOUS
  Filled 2023-12-21: qty 20

## 2023-12-21 MED ORDER — ATORVASTATIN CALCIUM 20 MG PO TABS
20.0000 mg | ORAL_TABLET | Freq: Every day | ORAL | Status: DC
Start: 2023-12-21 — End: 2023-12-23
  Administered 2023-12-21 – 2023-12-23 (×3): 20 mg via ORAL
  Filled 2023-12-21 (×3): qty 1

## 2023-12-21 MED ORDER — CELECOXIB 200 MG PO CAPS
200.0000 mg | ORAL_CAPSULE | Freq: Two times a day (BID) | ORAL | Status: DC
  Administered 2023-12-21 – 2023-12-23 (×4): 200 mg via ORAL
  Filled 2023-12-21 (×5): qty 1

## 2023-12-21 MED ORDER — ACETAMINOPHEN 500 MG PO TABS
1000.0000 mg | ORAL_TABLET | Freq: Three times a day (TID) | ORAL | Status: DC
  Administered 2023-12-21 – 2023-12-23 (×6): 1000 mg via ORAL
  Filled 2023-12-21 (×6): qty 2

## 2023-12-21 MED ORDER — MAGNESIUM CITRATE PO SOLN: 1.0000 | Freq: Once | ORAL | 0 refills | Status: AC

## 2023-12-21 MED ORDER — LACTATED RINGERS IV SOLN: INTRAVENOUS | Status: DC

## 2023-12-21 MED ORDER — LACTATED RINGERS IV BOLUS: 1000.0000 mL | INTRAVENOUS | Status: DC

## 2023-12-21 MED ORDER — TRAMADOL HCL 50 MG PO TABS: 50.0000 mg | ORAL_TABLET | Freq: Four times a day (QID) | ORAL | Status: DC | PRN

## 2023-12-21 MED ORDER — OXYCODONE HCL 5 MG PO TABS: 5.0000 mg | ORAL_TABLET | Freq: Four times a day (QID) | ORAL | Status: DC | PRN

## 2023-12-21 MED ORDER — ORAL CARE MOUTH RINSE
15.0000 mL | OROMUCOSAL | Status: DC
  Administered 2023-12-21: 15 mL via OROMUCOSAL

## 2023-12-21 MED ORDER — METRONIDAZOLE 500 MG/100ML IV SOLN
500.0000 mg | Freq: Two times a day (BID) | INTRAVENOUS | Status: DC
  Administered 2023-12-22 – 2023-12-23 (×3): 500 mg via INTRAVENOUS
  Filled 2023-12-21 (×4): qty 100

## 2023-12-21 MED ORDER — MAGNESIUM HYDROXIDE 400 MG/5ML PO SUSP
15.0000 mL | Freq: Every day | ORAL | Status: DC | PRN
  Administered 2023-12-21: 15 mL via ORAL
  Filled 2023-12-21: qty 30

## 2023-12-21 MED ORDER — SODIUM CHLORIDE 0.9 % IV SOLN: INTRAVENOUS | Status: AC | PRN

## 2023-12-21 MED ORDER — ASPIRIN 81 MG PO TBEC
81.0000 mg | DELAYED_RELEASE_TABLET | Freq: Two times a day (BID) | ORAL | Status: DC
  Administered 2023-12-21 – 2023-12-23 (×4): 81 mg via ORAL
  Filled 2023-12-21 (×4): qty 1

## 2023-12-21 MED ORDER — SODIUM CHLORIDE 0.9 % IV SOLN: 1.0000 g | INTRAVENOUS | Status: DC

## 2023-12-21 MED ORDER — CHLORHEXIDINE GLUCONATE CLOTH 2 % EX PADS
6.0000 | MEDICATED_PAD | Freq: Every day | CUTANEOUS | Status: DC
  Administered 2023-12-21 – 2023-12-23 (×3): 6 via TOPICAL

## 2023-12-21 MED ORDER — LEVOTHYROXINE SODIUM 88 MCG PO TABS
88.0000 ug | ORAL_TABLET | Freq: Every day | ORAL | Status: DC
  Administered 2023-12-22 – 2023-12-23 (×2): 88 ug via ORAL
  Filled 2023-12-21 (×2): qty 1

## 2023-12-21 NOTE — ED Notes (Signed)
 Called CCMD for central monitoring at this time

## 2023-12-21 NOTE — Telephone Encounter (Signed)
 Noted, patient evaluated in the ED. Will review notes.

## 2023-12-21 NOTE — H&P (Addendum)
 History and Physical    Patient: Molly Wall FMW:[id number] DOB: 10-Sep-1945 DOA: 12/21/2023 DOS: the patient was seen and examined on 12/21/2023 PCP: Gretta Comer POUR, NP  Patient coming from: Home  Chief Complaint:  Chief Complaint  Patient presents with   Constipation   Urinary Retention   HPI: Molly Wall is a 78 y.o. female with medical history significant of type 2 diabetes, asthma, OSA, GERD, hyperlipidemia chronic low back pain with lumbar radiculopathy, recent right total knee replacement with Dr. Mardee on 12/14/2023 who presented to the ED today for evaluation of lower abdominal pain and severe postop constipation since her knee replacement.  Patient is somnolent during admission encounter, daughter at bedside provides history.  Daughter was unaware of patient having constipation since her knee surgery until patient mentioned that yesterday.  Patient spent taking MiraLAX at home without relief and today developed worsening lower abdominal pain prompting them to come to ED for evaluation.  No reports of fevers chills, cough congestion sore throat or shortness of breath, no nausea vomiting, no reports of dysuria, hematuria, confusion or weakness numbness tingling.  ED course: Initial vitals temp 98.1, HR 103, RR 18, BP 137/58, SpO2 99% on room air. Labs obtained including CMP and CBC were notable for sodium 128, potassium 5.5, chloride 90, bicarb 20, glucose elevated 408 with anion gap 18, total bili 2.0, white count 15.3, hemoglobin 11.3.  UA was negative for signs of infection. CT abdomen pelvis showed findings of early stercoral colitis with moderate presacral stranding and fecal ball in the rectum measuring 5.3 x 4.8 x 11.6 cm, total colonic diverticulosis without changes of acute diverticulitis.  Patient had urinary retention in the ED with over 400 cc on bladder scanned, a Foley catheter was placed yielding 600 cc urine output.  ED PA performed manual  disimpaction with reportedly moderate stool return.  Patient met SIRS criteria with leukocytosis and tachycardia, was treated with IV fluids and empiric IV antibiotics.  Patient was started on insulin  drip for suspected DKA.  Patient will be admitted to stepdown unit while on insulin  drip, and started on aggressive bowel regimen for opioid-induced postop constipation.   Review of Systems: unable to review all systems due to the inability of the patient to answer questions.   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   Breast cancer (HCC)    Diabetes mellitus without complication (HCC)    Family history of breast cancer    Family history of colon cancer    Hypercholesteremia    Hypothyroidism    Lumbar herniated disc    4-5th lumbar   Nausea and vomiting 09/09/2023   OSA (obstructive sleep apnea)    used to use CPAP, not any more over 1 1/2 yrs   Osteoarthritis    Salivary stone    left side   Sleep apnea    no cpap in 2 yrs    Vitamin D  deficiency    Past Surgical History:  Procedure Laterality Date   APPENDECTOMY     BREAST BIOPSY Left 07/10/2023   MM LT BREAST BX W LOC DEV 1ST LESION IMAGE BX SPEC STEREO GUIDE 07/10/2023 [facility]   BREAST LUMPECTOMY Right    per pt - precancerous   BREAST LUMPECTOMY WITH RADIOACTIVE SEED  LOCALIZATION Right 08/12/2017   Procedure: RIGHT BREAST LUMPECTOMY X'S 2 WITH RADIOACTIVE SEED LOCALIZATION X'S 2;  Surgeon: Gail Favorite, MD;  Location: [facility];  Service: General;  Laterality: Right;   [place]     [place]  - [place] but given a 10 yr recall per pt    GANGLION CYST EXCISION     78 years old   INCISION AND DRAINAGE ABSCESS ANAL     KNEE ARTHROPLASTY Left 07/20/2023   Procedure: ARTHROPLASTY, KNEE, TOTAL, USING IMAGELESS  COMPUTER-ASSISTED NAVIGATION;  Surgeon: Mardee Lynwood SQUIBB, MD;  Location: [facility];  Service: Orthopedics;  Laterality: Left;   KNEE ARTHROPLASTY Right 12/14/2023   Procedure: ARTHROPLASTY, KNEE, TOTAL, USING IMAGELESS COMPUTER-ASSISTED NAVIGATION;  Surgeon: Mardee Lynwood SQUIBB, MD;  Location: [facility];  Service: Orthopedics;  Laterality: Right;   KNEE SURGERY  2014   SHOULDER SURGERY  1976   TONSILLECTOMY     age 69   Social History:  reports that she has never smoked. She has never been exposed to tobacco smoke. She has never used smokeless tobacco. She reports that she does not currently use alcohol. She reports that she does not use drugs.  Allergies  Allergen Reactions   Iodine-131 Anaphylaxis    Pt states when she was 21=she drank an experimental Iodinated Radioisotope for her thyroid . She described it was a small amount in a milk carton in [place] . She states they stopped using it shortly after because the trial wasn't successful. SPM   Prednisone      Make sugar jump way up.    Family History  Problem Relation Age of Onset   Alzheimer's disease Mother    Asthma Mother    Dementia Mother        cause of death   Arthritis Father    Heart attack Father 24       cause of death   Breast cancer Sister 1   Healthy Sister    Breast cancer Daughter 55   Healthy Daughter    Healthy Daughter    Liver cancer Maternal Uncle    Colon cancer Paternal Grandmother    Healthy Brother    Colon polyps Neg Hx    Rectal cancer Neg Hx    Stomach cancer Neg Hx    Esophageal cancer Neg Hx     Prior to Admission medications   Medication Sig Start Date End Date Taking? Authorizing Provider  bisacodyl  (DULCOLAX) 5 MG EC tablet Take 1 tablet (5 mg total) by mouth daily as needed for moderate constipation. 12/21/23 12/20/24 Yes Dougherty, Lauren A, PA-C  magnesium  citrate SOLN Take 296 mLs (1 Bottle total) by mouth once for 1 dose. 12/21/23 12/21/23 Yes Dougherty, Lauren A, PA-C  psyllium (METAMUCIL  SMOOTH TEXTURE) 58.6 % powder Take 1 packet by mouth 3 (three) times daily. 12/21/23  Yes Dougherty, Lauren A, PA-C  acetaminophen  (TYLENOL ) 650 MG CR tablet Take 1-2 tablets by mouth 3 (three) times daily as needed for pain.    [provider]  alendronate  (FOSAMAX ) 70 MG tablet Take 1 tablet (70 mg total) by mouth once a week. Take with a full glass of water on an empty stomach. Patient not taking: Reported on 12/14/2023 12/01/23   Dolphus Reiter, MD  aspirin  81 MG chewable tablet Chew 1 tablet (81 mg total) by mouth 2 (two) times daily. 07/21/23   Drake Chew, PA-C  aspirin  81 MG chewable tablet Chew 1 tablet (81 mg total) by mouth 2 (  two) times daily. 12/15/23   Drake Chew, PA-C  atorvastatin  (LIPITOR) 20 MG tablet TAKE 1 TABLET BY MOUTH EVERY DAY FOR CHOLESTEROL 12/05/23   Clark, Katherine K, NP  celecoxib  (CELEBREX ) 200 MG capsule Take 1 capsule (200 mg total) by mouth 2 (two) times daily. 12/15/23   Drake Chew, PA-C  Glucagon  3 MG/DOSE POWD Place 3 mg into the nose once as needed for up to 1 dose. 05/01/23   Trixie File, MD  insulin  lispro (HUMALOG  KWIKPEN) 100 UNIT/ML KwikPen INJECT 25 UNITS INTO THE SKIN 3 (THREE) TIMES DAILY WITH MEALS 10/21/22   Trixie File, MD  levothyroxine  (SYNTHROID ) 88 MCG tablet TAKE 1 AND 1/2 TABLET BY MOUTH EVERY SUNDAY. TAKE 1 TABLET BY MOUTH MON THROUGH SAT. TAKE ON AN EMPTY STOMACH WITH WATER ONLY. NO FOOD OR OTHER MEDICATIONS FOR 30 MINUTES. 12/16/23   Gretta Comer POUR, NP  metFORMIN  (GLUCOPHAGE -XR) 500 MG 24 hr tablet TAKE 2 TABLETS (1,000 MG TOTAL) BY MOUTH DAILY WITH BREAKFAST. FOR DIABETES. Patient not taking: No sig reported 12/22/22   Trixie File, MD  NOVOLOG  100 UNIT/ML injection INJECT UP TO 70 UNITS INTO THE SKIN ONCE FOR 1 DOSE. VIA PUMP 07/10/23   Trixie File, MD  oxyCODONE  (OXY IR/ROXICODONE ) 5 MG immediate release tablet Take 1 tablet (5 mg total) by mouth every 4 (four) hours as needed for moderate pain (pain  score 4-6) (pain score 4-6). 12/15/23   Drake Chew, PA-C  Semaglutide , 1 MG/DOSE, (OZEMPIC , 1 MG/DOSE,) 4 MG/3ML SOPN INJECT 1 MG INTO THE SKIN ONE TIME PER WEEK 08/14/23   Trixie File, MD  traMADol  (ULTRAM ) 50 MG tablet Take 1-2 tablets (50-100 mg total) by mouth every 4 (four) hours as needed for moderate pain (pain score 4-6). 12/15/23   Drake Chew, PA-C    Physical Exam: Vitals:   12/21/23 1745 12/21/23 1810 12/21/23 1830 12/21/23 1900  BP:  136/70 (!) 137/57 (!) 147/53  Pulse: (!) 110 (!) 114    Resp: 16 (!) 22 16 15   Temp:      TempSrc:      SpO2: 98% 100%     General exam: Somnolent, no acute distress, obese HEENT: atraumatic, clear conjunctiva, anicteric sclera, moist mucus membranes Respiratory system: CTAB diminished due to poor inspiratory volumes, no wheezes or rhonchi, normal respiratory effort at rest, on room air. Cardiovascular system: normal S1/S2, tachycardic, regular rhythm, unable to visualize JVD, trace bilateral lower extremity edema.   Gastrointestinal system: soft, NT, ND, no HSM felt, +bowel sounds. Central nervous system: Exam limited by somnolence. no gross focal neurologic deficits Extremities: Right anterior lower extremity with dressing over surgical incision, no surrounding warmth or erythema, no drainage visualized Skin: dry, intact, normal temperature, no rashes seen Psychiatry: Exam limited by somnolence   Data Reviewed:  As reviewed in detail above  Assessment and Plan:  DKA --initial glucose 408 with anion gap 18, pH 7.29 on VBG.  Beta hydroxybutyrate pending.  Started on insulin  drip per Endo tool in the ED. pseudohyponatremia noted. Insulin -dependent type 2 diabetes -last A1c 6.9% -- Admit to stepdown -- Continue IV insulin  per Endo tool -- IV fluids -- Serial BMPs -- Replace potassium as needed  SIRS -with tachycardia and leukocytosis present on admission.  Likely due to DKA and stercoral colitis.  Treated per sepsis  protocol in the ED with IV fluids and antibiotics.  UA negative.   -- Defer further antibiotics at this time -- Follow cultures  Stercoral colitis Opioid-induced constipation Status post  manual disimpaction in the ED with moderate stool return reported -- Continue aggressive bowel regimen -- MiraLAX twice daily, senna-S twice daily -- As needed milk of magnesia, Dulcolax suppositories -- Consider enemas tomorrow if needed  Acute urinary retention -POA.  Bladder scan in the ED showed 495 cc, Foley was placed and yielded 600 cc urine output. -- Continue Foley catheter and DC for voiding trial prior to admission  Acute metabolic encephalopathy --patient extremely somnolent during admission encounter, daughter reports was awake earlier in the day.  No focal neurologic changes.  pCO2 on VBG was mildly low, hypercapnia ruled out. -- Check ammonia, TSH -- Management of underlying issues as outlined above -- Delirium precautions  Recent right total knee arthroplasty --on 12/14/2023 with Dr. Hooten.   -- PT OT evaluations --Pain control with scheduled Tylenol , as needed tramadol  or oxy --Continue Celebrex  -- Minimize opiates is much as possible -- Would let Ortho know tomorrow of patient's admission  Hypothyroidism -- TSH pending -- Continue home Synthroid     Advance Care Planning: CODE STATUS full code  Consults: None  Family Communication: Daughter at bedside during admission encounter  Severity of Illness: The appropriate patient status for this patient is INPATIENT. Inpatient status is judged to be reasonable and necessary in order to provide the required intensity of service to ensure the patient's safety. The patient's presenting symptoms, physical exam findings, and initial radiographic and laboratory data in the context of their chronic comorbidities is felt to place them at high risk for further clinical deterioration. Furthermore, it is not anticipated that the patient will be  medically stable for discharge from the hospital within 2 midnights of admission.   * I certify that at the point of admission it is my clinical judgment that the patient will require inpatient hospital care spanning beyond 2 midnights from the point of admission due to high intensity of service, high risk for further deterioration and high frequency of surveillance required.*  Author: Burnard DELENA Cunning, DO 12/21/2023 7:31 PM  For on call review [email or web address].

## 2023-12-21 NOTE — Telephone Encounter (Signed)
 FYI Only or Action Required?: FYI only for provider.  Patient was last seen in primary care on 12/09/2023 by Wendee Lynwood HERO, NP.  Called Nurse Triage reporting No chief complaint on file..  Symptoms began yesterday.  Interventions attempted: Rest, hydration, or home remedies and Dietary changes.  Symptoms are: gradually worsening.  Triage Disposition: See HCP Within 4 Hours (Or PCP Triage)  Patient/caregiver understands and will follow disposition?: Yes  Copied from CRM [id number]. Topic: Clinical - Red Word Triage >> Dec 21, 2023  7:58 AM Pinkey ORN wrote: Red Word that prompted transfer to Nurse Triage: Severe Constipation >> Dec 21, 2023  7:59 AM Pinkey ORN wrote: Daughter Manuelita states that the patient is experiencing some severe constipation, it's bad to the point patient can no longer urinate.  Reason for Disposition  [1] Rectal pain or fullness from fecal impaction (rectum full of stool) AND [2] NOT better after SITZ bath, suppository or enema  Answer Assessment - Initial Assessment Questions 1. STOOL PATTERN OR FREQUENCY: How often do you have a bowel movement (BM)?  (Normal range: 3 times a day to every 3 days)  When was your last BM?       Usually goes 3-4 times a week  2. STRAINING: Do you have to strain to have a BM?      Not usually  3. ONSET: When did the constipation begin?     Yesterday  4. RECTAL PAIN: Does your rectum hurt when the stool comes out? If Yes, ask: Do you have hemorrhoids? How bad is the pain?  (Scale 1-10; or mild, moderate, severe)     No  5. BM COMPOSITION: Are the stools hard?      None  6. BLOOD ON STOOLS: Has there been any blood on the toilet tissue or on the surface of the BM? If Yes, ask: When was the last time?     No  7. CHRONIC CONSTIPATION: Is this a new problem for you?  If No, ask: How long have you had this problem? (days, weeks, months)      Acute  8. CHANGES IN DIET OR HYDRATION: Have there been  any recent changes in your diet? How much fluids are you drinking on a daily basis?  How much have you had to drink today?     Able to drink fluids  9. MEDICINES: Have you been taking any new medicines? Are you taking any narcotic pain medicines? (e.g., Dilaudid , morphine, Percocet, Vicodin)     Tramadol , Oxycodone , Tylenol  (Arthritis)  10. LAXATIVES: Have you been using any stool softeners, laxatives, or enemas?  If Yes, ask What are you using, how often, and when was the last time?       Miralax, Emma  11. ACTIVITY:  How much walking do you do every day?  Has your activity level decreased in the past week?        Recent Knee Replacement  12. CAUSE: What do you think is causing the constipation?        Medication  13. MEDICAL HISTORY: Do you have a history of hemorrhoids, rectal fissures, rectal surgery, or rectal abscess?         No  14. OTHER SYMPTOMS: Do you have any other symptoms? (e.g., abdomen pain, bloating, fever, vomiting)       Bloating, Urinary Retention, Rectal Swelling  15. PREGNANCY: Is there any chance you are pregnant? When was your last menstrual period?       No and  No  Protocols used: Constipation-A-AH

## 2023-12-21 NOTE — Plan of Care (Signed)
 This patient was admitted to AR-ICU overnight night. The patient is AA+Ox1, but pleasant and redirectable. No focal neurological deficits are appreciated. No supplemental O2 requirement. NSR per telemetry. The patient continues to received IV insulin  infusion per EndoTool, as well as MIVF. PIV access only. Foley catheter remains in place, and continue foley catheter order is obtained fro mDr. Fausto per BPA recommendation. The patient presented to the SDU/ICU on an insulin  infusion with both a  Dexcom and Insulin  pump in place on her abdomen; Insulin  pump was removed by this RN per order immediately upon arrival to the unit, and given to the patient's daughter. LA is elevated overnight and is being trended per orders. The patient also received one dose of IV Flagyl overnight per orders.   Problem: Education: Goal: Ability to describe self-care measures that may prevent or decrease complications (Diabetes Survival Skills Education) will improve Outcome: Progressing Goal: Individualized Educational Video(s) Outcome: Progressing   Problem: Coping: Goal: Ability to adjust to condition or change in health will improve Outcome: Progressing   Problem: Fluid Volume: Goal: Ability to maintain a balanced intake and output will improve Outcome: Progressing   Problem: Health Behavior/Discharge Planning: Goal: Ability to identify and utilize available resources and services will improve Outcome: Progressing Goal: Ability to manage health-related needs will improve Outcome: Progressing   Problem: Metabolic: Goal: Ability to maintain appropriate glucose levels will improve Outcome: Progressing   Problem: Nutritional: Goal: Maintenance of adequate nutrition will improve Outcome: Progressing Goal: Progress toward achieving an optimal weight will improve Outcome: Progressing   Problem: Skin Integrity: Goal: Risk for impaired skin integrity will decrease Outcome: Progressing   Problem: Tissue  Perfusion: Goal: Adequacy of tissue perfusion will improve Outcome: Progressing   Problem: Education: Goal: Ability to describe self-care measures that may prevent or decrease complications (Diabetes Survival Skills Education) will improve Outcome: Progressing Goal: Individualized Educational Video(s) Outcome: Progressing   Problem: Cardiac: Goal: Ability to maintain an adequate cardiac output will improve Outcome: Progressing   Problem: Health Behavior/Discharge Planning: Goal: Ability to identify and utilize available resources and services will improve Outcome: Progressing Goal: Ability to manage health-related needs will improve Outcome: Progressing   Problem: Fluid Volume: Goal: Ability to achieve a balanced intake and output will improve Outcome: Progressing   Problem: Metabolic: Goal: Ability to maintain appropriate glucose levels will improve Outcome: Progressing   Problem: Nutritional: Goal: Maintenance of adequate nutrition will improve Outcome: Progressing Goal: Maintenance of adequate weight for body size and type will improve Outcome: Progressing   Problem: Respiratory: Goal: Will regain and/or maintain adequate ventilation Outcome: Progressing   Problem: Urinary Elimination: Goal: Ability to achieve and maintain adequate renal perfusion and functioning will improve Outcome: Progressing   Problem: Education: Goal: Knowledge of General Education information will improve Description: Including pain rating scale, medication(s)/side effects and non-pharmacologic comfort measures Outcome: Progressing   Problem: Health Behavior/Discharge Planning: Goal: Ability to manage health-related needs will improve Outcome: Progressing   Problem: Clinical Measurements: Goal: Ability to maintain clinical measurements within normal limits will improve Outcome: Progressing Goal: Will remain free from infection Outcome: Progressing Goal: Diagnostic test results will  improve Outcome: Progressing Goal: Respiratory complications will improve Outcome: Progressing Goal: Cardiovascular complication will be avoided Outcome: Progressing   Problem: Activity: Goal: Risk for activity intolerance will decrease Outcome: Progressing   Problem: Nutrition: Goal: Adequate nutrition will be maintained Outcome: Progressing   Problem: Coping: Goal: Level of anxiety will decrease Outcome: Progressing   Problem: Elimination: Goal: Will not experience  complications related to bowel motility Outcome: Progressing Goal: Will not experience complications related to urinary retention Outcome: Progressing   Problem: Pain Managment: Goal: General experience of comfort will improve and/or be controlled Outcome: Progressing   Problem: Safety: Goal: Ability to remain free from injury will improve Outcome: Progressing   Problem: Skin Integrity: Goal: Risk for impaired skin integrity will decrease Outcome: Progressing

## 2023-12-21 NOTE — ED Notes (Signed)
Bladder scan 495ml

## 2023-12-21 NOTE — ED Notes (Signed)
 Patient transported to CT

## 2023-12-21 NOTE — ED Notes (Signed)
 Bladder scan showed over 450 mL

## 2023-12-21 NOTE — Sepsis Progress Note (Signed)
 Elink following for sepsis protocol.

## 2023-12-21 NOTE — Progress Notes (Addendum)
       CROSS COVER NOTE  NAME: Molly Wall MRN: [id number] DOB : [birth year]    Concern as stated by nurse / staff   Lactic acid 3.1, up from 1.8     Pertinent findings on chart review: Admitted earlier with DKA and stercoral colitis and sepsis.  Patient was partially disimpacted in the ED per review of ED provider's note  CT abdomen pelvis demonstrates presacral stranding concerning for early stercoral colitis versus infectious verse inflammatory proctitis.   Patient Assessment       Date/Time Temp Pulse Resp BP  12/21/23 2154 100.3 98 15 --  12/21/23 2100 -- 103 Abnormal  12 149/77 Abnormal   12/21/23 2035 -- 110 Abnormal  17 157/94 Abnormal   12/21/23 2030 98.7 F (37.1 C) 113 Abnormal  26 Abnormal  135/119 Abnormal   12/21/23 1900 -- -- 15 147/53 Abnormal    Lactic Acid, Venous    Component Value Date/Time   LATICACIDVEN 3.1 (HH) 12/21/2023 2054   CT abdomen without evidence of perforation   Assessment and  Interventions   Assessment:  Stercoral colitis with sepsis DKA  Plan: Continue sepsis fluids Will start Rocephin and Flagyl due to severity of stercoral colitis Continue Bowel regimen Can consider surgical consult if additional concerns Continue management of DKA Continue close management in ICU

## 2023-12-21 NOTE — Progress Notes (Signed)
 PHARMACIST - PHYSICIAN COMMUNICATION  CONCERNING:  Enoxaparin (Lovenox) for DVT Prophylaxis    RECOMMENDATION: Patient was prescribed enoxaprin 40mg  q24 hours for VTE prophylaxis.   There were no vitals filed for this visit.  There is no height or weight on file to calculate BMI.  Estimated Creatinine Clearance: 66.3 mL/min (by C-G formula based on SCr of 0.93 mg/dL).   Based on [facility] policy patient is candidate for enoxaparin 0.5mg /kg TBW SQ every 24 hours based on BMI being >30.   DESCRIPTION: Pharmacy has adjusted enoxaparin dose per [facility] policy.  Patient is now receiving enoxaparin 0.5 mg/kg every 24 hours    Adriana JONETTA Bolster, PharmD Clinical Pharmacist  12/21/2023 7:45 PM

## 2023-12-21 NOTE — ED Provider Notes (Signed)
 [facility] Provider Note    Event Date/Time   First MD Initiated Contact with Patient 12/21/23 1145     (approximate)   History   Constipation and Urinary Retention   HPI  Molly Wall is a 78 y.o. female with PMH of chronic pain, diabetes and recent right knee replacement presents for evaluation of constipation and urinary retention.  Patient had surgery last week and has been taking narcotic pain medication.  She was given some stool softeners but has not had a bowel movement since before the surgery.  Patient also reports some recent urinary retention which she believes is a result of the constipation.  Patient has tried manual disimpaction without any success.  He endorses lower abdomen pain.  No vomiting but has had some nausea.      Physical Exam   Triage Vital Signs: ED Triage Vitals  Encounter Vitals Group     BP 12/21/23 1131 (!) 137/58     Girls Systolic BP Percentile --      Girls Diastolic BP Percentile --      Boys Systolic BP Percentile --      Boys Diastolic BP Percentile --      Pulse Rate 12/21/23 1131 (!) 103     Resp 12/21/23 1131 18     Temp 12/21/23 1129 98.1 F (36.7 C)     Temp Source 12/21/23 1129 Oral     SpO2 12/21/23 1131 99 %     Weight --      Height --      Head Circumference --      Peak Flow --      Pain Score 12/21/23 1129 9     Pain Loc --      Pain Education --      Exclude from Growth Chart --     Most recent vital signs: Vitals:   12/21/23 1601 12/21/23 1607  BP: (!) 128/58   Pulse: (!) 101   Resp: 18   Temp: 98.7 F (37.1 C) 98.7 F (37.1 C)  SpO2: 95%    General: Awake, very uncomfortable appearing. CV:  Good peripheral perfusion.  RRR. Resp:  Normal effort.  CTAB. Abd:  No distention.  Soft, tender to palpation across lower abdomen. Other:  Hard stool ball palpable on DRE   ED Results / Procedures / Treatments   Labs (all labs ordered are listed, but only abnormal  results are displayed) Labs Reviewed  CBG MONITORING, ED - Abnormal; Notable for the following components:      Result Value   Glucose-Capillary 413 (*)    All other components within normal limits  COMPREHENSIVE METABOLIC PANEL WITH GFR  CBC WITH DIFFERENTIAL/PLATELET  URINALYSIS, ROUTINE W REFLEX MICROSCOPIC     RADIOLOGY  CT abdomen pelvis ordered.   PROCEDURES:  Critical Care performed: No  Procedures   MEDICATIONS ORDERED IN ED: Medications  sodium phosphate  (FLEET) enema 1 enema (1 enema Rectal Given 12/21/23 1324)  sodium chloride  0.9 % bolus 1,000 mL (1,000 mLs Intravenous New Bag/Given 12/21/23 1607)     IMPRESSION / MDM / ASSESSMENT AND PLAN / ED COURSE  I reviewed the triage vital signs and the nursing notes.                             78 year old female presents for evaluation of constipation.  Blood pressure and heart rate are elevated upon initial assessment  and patient does appear quite uncomfortable.  Vital signs stable otherwise.  Differential diagnosis includes, but is not limited to, constipation, fecal impaction, urinary retention secondary to constipation, urethral stricture.  Patient's presentation is most consistent with acute, uncomplicated illness.  Patient did have a hard stool ball on DRE which I was able to manually disimpact. Patient then had a fleets enema with some relief. She was able to urinate after having a bowel movement but is still retaining over 400 mL on bladder scan.  Recommended that patient continue with miralax. Will also add metamucil, magnesium  citrate and dulcolax.   Patient is still quite uncomfortable and has not had much stool output. Will obtain labs, UA and CT scan. I want to make sure patient does not have a UTI as a result of the urinary retention and make sure the retention is not being caused by UTI. If labs reassuring and CT scan does not show ileus or bowel obstruction feel that patient will be stable for outpatient  management with a good bowel regimen.  Clinical Course as of 12/21/23 1612  Mon Dec 21, 2023  1523 Glucose-Capillary(!): 413 [LD]  1558 Will give patient a liter of fluids and recheck blood sugar after this. [LD]  1611 Care of the patient will be passed off to my attending, Dr. Levander pending labs, UA and CT scan. [LD]    Clinical Course User Index [LD] Cleaster Tinnie LABOR, PA-C     FINAL CLINICAL IMPRESSION(S) / ED DIAGNOSES   Final diagnoses:  Drug-induced constipation     Rx / DC Orders   ED Discharge Orders          Ordered    magnesium  citrate SOLN   Once        12/21/23 1533    psyllium (METAMUCIL SMOOTH TEXTURE) 58.6 % powder  3 times daily        12/21/23 1533    bisacodyl  (DULCOLAX) 5 MG EC tablet  Daily PRN        12/21/23 1533             Note:  This document was prepared using Dragon voice recognition software and may include unintentional dictation errors.   Cleaster Tinnie LABOR, PA-C 12/21/23 1612    Molly Artist POUR, MD 12/23/23 1134

## 2023-12-21 NOTE — ED Provider Notes (Signed)
 Care of this patient assumed from prior physician at 1600 pending labs, CT, disposition. Please see prior physician note for further details.  78 year old female with history of diabetes, recent right knee replacement presenting to the emergency department for evaluation of constipation with associated lower abdominal pain.  Had manual disimpaction here with moderate stool return, but did have persistent urinary retention of 400 cc.  Labs with leukocytosis with WBC of 15.3, mild anemia with hemoglobin of 1211.3, no reported acute bleeding sources.  CMP notable for hyperglycemia with glucose of 408 with bicarb of 20 and anion gap of 18 concerning for DKA, mild hyperkalemia noted, likely improved with treatment of hyperglycemia.  Pseudohyponatremia noted as would be expected in the setting of hyperglycemia.  Patient updated on labs, ordered for insulin  drip for suspected DKA.  VBG and beta hydroxybutyric acid pending.  With her abdominal pain, will await results of CT.  Patient does have both contrast and steroid allergy, so we will order noncontrast study to ensure the patient does not have any acute surgical issue.  Ultimate plan for admission.   CT abdomen pelvis demonstrates presacral stranding concerning for early stercoral colitis versus infectious verse inflammatory proctitis.  Patient does have a leukocytosis here as well as persistent tachycardia despite IV fluids.  This does meet SIRS criteria.  Sepsis orders were initiated with empiric Rocephin and Flagyl.  Significant difficulty with IV access, IV team has been consulted but cultures and antibiotics delayed due to this. Patient did have persistent urinary retention after manual reduction attempts. With this, Foley catheter was placed with return of over 600 cc of urine. Will reach out to hospitalist team to discuss admission.     Levander Slate, MD 12/21/23 [phone]

## 2023-12-21 NOTE — ED Triage Notes (Signed)
 Pt had surgery a week ago and has had constipation and urinary retention from it. Pt on narcotics for pain control. Stool softeners and manual manipulation by patient has not worked which was recommended by her doctor.

## 2023-12-22 ENCOUNTER — Inpatient Hospital Stay

## 2023-12-22 DIAGNOSIS — E1110 Type 2 diabetes mellitus with ketoacidosis without coma: Secondary | ICD-10-CM | POA: Diagnosis not present

## 2023-12-22 LAB — BASIC METABOLIC PANEL WITH GFR
Anion gap: 13 (ref 5–15)
Anion gap: 9 (ref 5–15)
Anion gap: 9 (ref 5–15)
BUN: 16 mg/dL (ref 8–23)
BUN: 16 mg/dL (ref 8–23)
BUN: 16 mg/dL (ref 8–23)
CO2: 22 mmol/L (ref 22–32)
CO2: 23 mmol/L (ref 22–32)
CO2: 25 mmol/L (ref 22–32)
Calcium: 8.3 mg/dL — ABNORMAL LOW (ref 8.9–10.3)
Calcium: 8.3 mg/dL — ABNORMAL LOW (ref 8.9–10.3)
Calcium: 8.6 mg/dL — ABNORMAL LOW (ref 8.9–10.3)
Chloride: 97 mmol/L — ABNORMAL LOW (ref 98–111)
Chloride: 101 mmol/L (ref 98–111)
Chloride: 100 mmol/L (ref 98–111)
Creatinine, Ser: 0.73 mg/dL (ref 0.44–1.00)
Creatinine, Ser: 0.69 mg/dL (ref 0.44–1.00)
Creatinine, Ser: 0.85 mg/dL (ref 0.44–1.00)
GFR, Estimated: >60 mL/min (ref >60)
GFR, Estimated: >60 mL/min (ref >60)
GFR, Estimated: >60 mL/min (ref >60)
Glucose, Bld: 126 mg/dL — ABNORMAL HIGH (ref 70–99)
Glucose, Bld: 165 mg/dL — ABNORMAL HIGH (ref 70–99)
Glucose, Bld: 171 mg/dL — ABNORMAL HIGH (ref 70–99)
Potassium: 4.1 mmol/L (ref 3.5–5.1)
Potassium: 4.3 mmol/L (ref 3.5–5.1)
Potassium: 4.5 mmol/L (ref 3.5–5.1)
Sodium: 132 mmol/L — ABNORMAL LOW (ref 135–145)
Sodium: 133 mmol/L — ABNORMAL LOW (ref 135–145)
Sodium: 134 mmol/L — ABNORMAL LOW (ref 135–145)

## 2023-12-22 LAB — GLUCOSE, CAPILLARY
Glucose-Capillary: 129 mg/dL — ABNORMAL HIGH (ref 70–99)
Glucose-Capillary: 139 mg/dL — ABNORMAL HIGH (ref 70–99)
Glucose-Capillary: 143 mg/dL — ABNORMAL HIGH (ref 70–99)
Glucose-Capillary: 159 mg/dL — ABNORMAL HIGH (ref 70–99)
Glucose-Capillary: 159 mg/dL — ABNORMAL HIGH (ref 70–99)
Glucose-Capillary: 160 mg/dL — ABNORMAL HIGH (ref 70–99)
Glucose-Capillary: 160 mg/dL — ABNORMAL HIGH (ref 70–99)
Glucose-Capillary: 162 mg/dL — ABNORMAL HIGH (ref 70–99)
Glucose-Capillary: 167 mg/dL — ABNORMAL HIGH (ref 70–99)
Glucose-Capillary: 173 mg/dL — ABNORMAL HIGH (ref 70–99)
Glucose-Capillary: 178 mg/dL — ABNORMAL HIGH (ref 70–99)
Glucose-Capillary: 182 mg/dL — ABNORMAL HIGH (ref 70–99)
Glucose-Capillary: 195 mg/dL — ABNORMAL HIGH (ref 70–99)
Glucose-Capillary: 230 mg/dL — ABNORMAL HIGH (ref 70–99)
Glucose-Capillary: 304 mg/dL — ABNORMAL HIGH (ref 70–99)
Glucose-Capillary: 318 mg/dL — ABNORMAL HIGH (ref 70–99)
Glucose-Capillary: 373 mg/dL — ABNORMAL HIGH (ref 70–99)

## 2023-12-22 LAB — CBC
HCT: 30.8 % — ABNORMAL LOW (ref 36.0–46.0)
Hemoglobin: 9.8 g/dL — ABNORMAL LOW (ref 12.0–15.0)
MCH: 27.3 pg (ref 26.0–34.0)
MCHC: 31.8 g/dL (ref 30.0–36.0)
MCV: 85.8 fL (ref 80.0–100.0)
Platelets: 278 K/uL (ref 150–400)
RBC: 3.59 MIL/uL — ABNORMAL LOW (ref 3.87–5.11)
RDW: 14.7 % (ref 11.5–15.5)
WBC: 13 K/uL — ABNORMAL HIGH (ref 4.0–10.5)
nRBC: 0 % (ref 0.0–0.2)

## 2023-12-22 LAB — LACTIC ACID, PLASMA
Lactic Acid, Venous: 1.7 mmol/L (ref 0.5–1.9)
Lactic Acid, Venous: 1.5 mmol/L (ref 0.5–1.9)

## 2023-12-22 LAB — BETA-HYDROXYBUTYRIC ACID
Beta-Hydroxybutyric Acid: 1.35 mmol/L — ABNORMAL HIGH (ref 0.05–0.27)
Beta-Hydroxybutyric Acid: 0.33 mmol/L — ABNORMAL HIGH (ref 0.05–0.27)
Beta-Hydroxybutyric Acid: 0.21 mmol/L (ref 0.05–0.27)
Beta-Hydroxybutyric Acid: 0.07 mmol/L (ref 0.05–0.27)

## 2023-12-22 LAB — TSH: TSH: 11.837 u[IU]/mL — ABNORMAL HIGH (ref 0.350–4.500)

## 2023-12-22 LAB — TROPONIN I (HIGH SENSITIVITY): Troponin I (High Sensitivity): 6 ng/L (ref <18)

## 2023-12-22 MED ORDER — ENOXAPARIN SODIUM 40 MG/0.4ML IJ SOSY
40.0000 mg | PREFILLED_SYRINGE | INTRAMUSCULAR | Status: DC
Start: 2023-12-22 — End: 2023-12-22

## 2023-12-22 MED ORDER — SODIUM CHLORIDE 0.9 % IV SOLN: 1.0000 g | INTRAVENOUS | Status: DC

## 2023-12-22 MED ORDER — INSULIN GLARGINE 100 UNIT/ML ~~LOC~~ SOLN
10.0000 [IU] | Freq: Every day | SUBCUTANEOUS | Status: DC
  Filled 2023-12-22: qty 0.1

## 2023-12-22 MED ORDER — SIMETHICONE 80 MG PO CHEW
80.0000 mg | CHEWABLE_TABLET | Freq: Four times a day (QID) | ORAL | Status: DC
  Administered 2023-12-22 – 2023-12-23 (×2): 80 mg via ORAL
  Filled 2023-12-22 (×3): qty 1

## 2023-12-22 MED ORDER — ENOXAPARIN SODIUM 60 MG/0.6ML IJ SOSY
0.5000 mg/kg | PREFILLED_SYRINGE | INTRAMUSCULAR | Status: DC
Start: 2023-12-22 — End: 2023-12-23
  Administered 2023-12-22: 57.5 mg via SUBCUTANEOUS
  Filled 2023-12-22: qty 0.6

## 2023-12-22 MED ORDER — INSULIN GLARGINE 100 UNIT/ML ~~LOC~~ SOLN
35.0000 [IU] | Freq: Every day | SUBCUTANEOUS | Status: DC
  Administered 2023-12-22: 35 [IU] via SUBCUTANEOUS
  Filled 2023-12-22 (×2): qty 0.35

## 2023-12-22 MED ORDER — INSULIN ASPART 100 UNIT/ML IJ SOLN
0.0000 [IU] | INTRAMUSCULAR | Status: DC
  Administered 2023-12-22: 6 [IU] via SUBCUTANEOUS
  Administered 2023-12-22: 4 [IU] via SUBCUTANEOUS
  Administered 2023-12-23: 3 [IU] via SUBCUTANEOUS
  Administered 2023-12-23: 4 [IU] via SUBCUTANEOUS
  Administered 2023-12-23: 3 [IU] via SUBCUTANEOUS
  Filled 2023-12-22 (×5): qty 1

## 2023-12-22 MED ORDER — FAMOTIDINE IN NACL 20-0.9 MG/50ML-% IV SOLN
20.0000 mg | Freq: Once | INTRAVENOUS | Status: AC
  Administered 2023-12-22: 20 mg via INTRAVENOUS
  Filled 2023-12-22: qty 50

## 2023-12-22 MED ORDER — MORPHINE SULFATE (PF) 2 MG/ML IV SOLN: 1.0000 mg | INTRAVENOUS | Status: DC

## 2023-12-22 MED ORDER — ALUM & MAG HYDROXIDE-SIMETH 200-200-20 MG/5ML PO SUSP
30.0000 mL | ORAL | Status: DC | PRN
  Administered 2023-12-22 – 2023-12-23 (×2): 30 mL via ORAL
  Filled 2023-12-22 (×2): qty 30

## 2023-12-22 MED ORDER — MORPHINE SULFATE (PF) 2 MG/ML IV SOLN
2.0000 mg | Freq: Once | INTRAVENOUS | Status: AC
  Administered 2023-12-22: 2 mg via INTRAVENOUS
  Filled 2023-12-22: qty 1

## 2023-12-22 MED ORDER — IOHEXOL 350 MG/ML SOLN
75.0000 mL | Freq: Once | INTRAVENOUS | Status: AC | PRN
  Administered 2023-12-22: 75 mL via INTRAVENOUS

## 2023-12-22 NOTE — Inpatient Diabetes Management (Signed)
 Inpatient Diabetes Program Recommendations  AACE/ADA: New Consensus Statement on Inpatient Glycemic Control   Target Ranges:  Prepandial:   less than 140 mg/dL      Peak postprandial:   less than 180 mg/dL (1-2 hours)      Critically ill patients:  140 - 180 mg/dL    Latest Reference Range & Units 12/22/23 04:09 12/22/23 05:04 12/22/23 06:18 12/22/23 06:57 12/22/23 08:07 12/22/23 09:09 12/22/23 10:19 12/22/23 11:21  Glucose-Capillary 70 - 99 mg/dL 839 (H) 826 (H) 837 (H) 167 (H) 178 (H) 195 (H) 129 (H) 159 (H)    Latest Reference Range & Units 12/21/23 16:00  CO2 22 - 32 mmol/L 20 (L)  Glucose 70 - 99 mg/dL 591 (H)  Anion gap 5 - 15  18 (H)    Latest Reference Range & Units 12/21/23 18:18 12/21/23 20:54 12/21/23 23:06 12/22/23 03:34 12/22/23 07:17  Beta-Hydroxybutyric Acid 0.05 - 0.27 mmol/L 5.35 (H) 2.29 (H) 1.35 (H) 0.33 (H) 0.21   Review of Glycemic Control  Diabetes history: DM2 Outpatient Diabetes medications: V-Go 30 insulin  pump (provides 30 units of basal every 24 hours), uses 7 clicks with breakfast, 6 clicks with lunch, and 7 clicks with supper (each click is 2 units of insulin ), Metformin  1000 mg QAM, Humalog  as needed 1-3 times a day Current orders for Inpatient glycemic control: IV insulin ; transitioning to Lantus  35 units daily, Novolog  0-6 units Q4H   Inpatient Diabetes Program Recommendations:    Insulin : Initially had Lantus  10 units daily ordered for transition from IV to SQ insulin . Sent chat message to Dr. Trudy and requested that Lantus  be increased to 35 units Q24H and order was changed. Bedside RN aware of change.  NOTE: Patient has DM2 and uses a V-Go30 insulin  pump, Dexcom G7, Metformin , and also takes Humalog  injections depending on glucose.  Patient recently started seeing Dr. Cherilyn (Endocrinologist) and was seen on 10/28/23. Per office note on 10/28/23, OmniPod insulin  pump was prescribed.   Spoke with patient and daughter at bedside regarding DM control  and insulin  pump. Patient confirms that she does not currently have insulin  pump on but she uses V-Go 30 insulin  pump; she has not switched over the OmniPod yet due to insurance approval. She reports her insurance has now approved it but she want to wait until she is over her knee surgeries before she starts to use it. She will need to see the pump trainer to be trained on it prior to starting it.   Patient's daughter states that her sister said when they removed the insulin  pump when she got to the hospital that it was noted not to be on and was wet from the insulin  likely leaking.  Patient uses V-Go 30 insulin  pump with Humalog  insulin  and she takes 7 clicks with breakfast, 6 clicks with lunch, and 7 clicks with supper.  Patient reports that she also takes Humalog  injections 1-3 times a day depending on glucose trends.  Patient reports that she experiences hypoglycemia almost daily (usually at night).  Discussed how she could use her Dexcom app to document meal intake, carbs, insulin  injections which could assist Endocrinology with make insulin  changes.  Patient is planning to get started on the OmniPod pump in the near future (insurance finally approved).  Discussed DKA treatment with IV insulin  and plan to transition to SQ insulin  regimen. Discussed that on the day of discharge, we will need to hold the Lantus  insulin  so she could restart her insulin  pump when she gets  home.  Patient verbalized understanding and she has no questions at this time.  Thanks, Earnie Gainer, RN, MSN, CDCES Diabetes Coordinator Inpatient Diabetes Program [phone] (Team Pager from 8am to 5pm)

## 2023-12-22 NOTE — Progress Notes (Addendum)
 PROGRESS NOTE   HPI was taken from Dr. Fausto: Molly Wall is a 78 y.o. female with medical history significant of type 2 diabetes, asthma, OSA, GERD, hyperlipidemia chronic low back pain with lumbar radiculopathy, recent right total knee replacement with Dr. Mardee on 12/14/2023 who presented to the ED today for evaluation of lower abdominal pain and severe postop constipation since her knee replacement.  Patient is somnolent during admission encounter, daughter at bedside provides history.  Daughter was unaware of patient having constipation since her knee surgery until patient mentioned that yesterday.  Patient spent taking MiraLAX at home without relief and today developed worsening lower abdominal pain prompting them to come to ED for evaluation.  No reports of fevers chills, cough congestion sore throat or shortness of breath, no nausea vomiting, no reports of dysuria, hematuria, confusion or weakness numbness tingling.   ED course: Initial vitals temp 98.1, HR 103, RR 18, BP 137/58, SpO2 99% on room air. Labs obtained including CMP and CBC were notable for sodium 128, potassium 5.5, chloride 90, bicarb 20, glucose elevated 408 with anion gap 18, total bili 2.0, white count 15.3, hemoglobin 11.3.  UA was negative for signs of infection. CT abdomen pelvis showed findings of early stercoral colitis with moderate presacral stranding and fecal ball in the rectum measuring 5.3 x 4.8 x 11.6 cm, total colonic diverticulosis without changes of acute diverticulitis.   Patient had urinary retention in the ED with over 400 cc on bladder scanned, a Foley catheter was placed yielding 600 cc urine output.  ED PA performed manual disimpaction with reportedly moderate stool return.   Patient met SIRS criteria with leukocytosis and tachycardia, was treated with IV fluids and empiric IV antibiotics.  Patient was started on insulin  drip for suspected DKA.   Patient will be admitted to stepdown unit  while on insulin  drip, and started on aggressive bowel regimen for opioid-induced postop constipation.     Chesney Suares [name]  FMW:[id number] DOB: [birth year] DOA: 12/21/2023 PCP: Gretta Comer POUR, NP   Assessment & Plan:   Principal Problem:   DKA (diabetic ketoacidosis) (HCC) Active Problems:   Opioid-induced constipation   Stercoral colitis   Acute metabolic encephalopathy   Acute urinary retention  Assessment and Plan: DKA: anion gap is closed. Weaned off of insulin  drip. Started glargine, SSI w/ accuchecks. Resolved.   DM2: well controlled, HbA1c 6.9. Started on glargine, SSI w/ accuchecks    Sepsis: met criteria w/ tachycardia, leukocytosis & likely secondary stercoral colitis.  Continue on IV rocephin again today and re-eval in AM. Will check a procal in AM. Resolved  Stercoral colitis: likely secondary to narcotic use. S/p disimpaction in the ED with moderate stool return reported. Continue on bowel regimen    Acute urinary retention:POA.  Bladder scan in the ED showed 495 cc, Foley was placed and yielded 600 cc urine output. Continue w/ foley and do a voiding trial prior to d/c    Acute metabolic encephalopathy: likely secondary to above. Improved today    Recent right total knee arthroplasty : on 12/14/2023 with Dr. Hooten. Consult ortho surg if needed.  Tylenol , NSAIDs, tramadol , oxy prn. Has not take narcotics in 24 hrs as per pt. PT/OT consulted  Normocytic anemia: no need for a transfusion currently     Hypothyroidism: continue on home dose of levothyroxine    Morbid obesity: BMI 43.3. Complicates overall care & prognosis     DVT prophylaxis: lovenox  Code Status:full  Family Communication:  discussed pt's care w/ pt's family at bedside and answered their questions  Disposition Plan: depends on PT/OT recs   Level of care: Stepdown Consultants:    Procedures:  Antimicrobials: rocephin   Subjective: Pt c/o loose stools  Objective: Vitals:    12/22/23 0400 12/22/23 0500 12/22/23 0600 12/22/23 0700  BP: (!) 135/56 (!) 110/50 (!) 140/57 (!) 137/55  Pulse: 94 88 (!) 101 82  Resp: 15 14 11 14   Temp:      TempSrc:      SpO2: 97% 94% 100% 98%  Weight:      Height:        Intake/Output Summary (Last 24 hours) at 12/22/2023 0951 Last data filed at 12/22/2023 0700 Gross per 24 hour  Intake 2800.38 ml  Output 1340 ml  Net 1460.38 ml   Filed Weights   12/21/23 2030 12/22/23 0325  Weight: 114.6 kg 115.5 kg    Examination:  General exam: Appears calm and comfortable  Respiratory system: Clear to auscultation. Respiratory effort normal. Cardiovascular system: S1 & S2 +. No rubs, gallops or clicks.  Gastrointestinal system: Abdomen is obese soft and nontender. Hyperactive bowel sounds heard. Central nervous system: Alert and oriented. Moves all extremities  Psychiatry: Judgement and insight appear normal. Mood & affect appropriate.     Data Reviewed: I have personally reviewed following labs and imaging studies  CBC: Recent Labs  Lab 12/21/23 1600 12/21/23 2054 12/22/23 0717  WBC 15.3* 15.5* 13.0*  NEUTROABS 14.2*  --   --   HGB 11.3* 11.2* 9.8*  HCT 36.0 35.2* 30.8*  MCV 86.1 86.1 85.8  PLT 294 275 278   Basic Metabolic Panel: Recent Labs  Lab 12/21/23 1600 12/21/23 2054 12/21/23 2306 12/22/23 0334 12/22/23 0717  NA 128* 133* 132* 133* 134*  K 5.5* 4.3 4.1 4.3 4.5  CL 90* 97* 97* 101 100  CO2 20* 19* 22 23 25   GLUCOSE 408* 223* 126* 165* 171*  BUN 19 15 16 16 16   CREATININE 0.93 0.64 0.73 0.69 0.85  CALCIUM  9.0 8.6* 8.3* 8.3* 8.6*   GFR: Estimated Creatinine Clearance: 72.6 mL/min (by C-G formula based on SCr of 0.85 mg/dL). Liver Function Tests: Recent Labs  Lab 12/21/23 1600  AST 24  ALT 17  ALKPHOS 105  BILITOT 2.0*  PROT 6.9  ALBUMIN 3.7   No results for input(s): LIPASE, AMYLASE in the last 168 hours. Recent Labs  Lab 12/21/23 2054  AMMONIA <13   Coagulation Profile: No  results for input(s): INR, PROTIME in the last 168 hours. Cardiac Enzymes: No results for input(s): CKTOTAL, CKMB, CKMBINDEX, TROPONINI in the last 168 hours. BNP (last 3 results) No results for input(s): PROBNP in the last 8760 hours. HbA1C: No results for input(s): HGBA1C in the last 72 hours. CBG: Recent Labs  Lab 12/22/23 0504 12/22/23 0618 12/22/23 0657 12/22/23 0807 12/22/23 0909  GLUCAP 173* 162* 167* 178* 195*   Lipid Profile: No results for input(s): CHOL, HDL, LDLCALC, TRIG, CHOLHDL, LDLDIRECT in the last 72 hours. Thyroid  Function Tests: Recent Labs    12/21/23 2306  TSH 11.837*   Anemia Panel: No results for input(s): VITAMINB12, FOLATE, FERRITIN, TIBC, IRON, RETICCTPCT in the last 72 hours. Sepsis Labs: Recent Labs  Lab 12/21/23 1934 12/21/23 2054 12/22/23 0038 12/22/23 0334  LATICACIDVEN 1.8 3.1* 1.7 1.5    Recent Results (from the past 240 hours)  Blood Culture (routine x 2)     Status: None (Preliminary result)   Collection Time: 12/21/23  7:35 PM   Specimen: BLOOD  Result Value Ref Range Status   Specimen Description BLOOD RIGHT ANTECUBITAL  Final   Special Requests   Final    BOTTLES DRAWN AEROBIC AND ANAEROBIC Blood Culture adequate volume   Culture   Final    NO GROWTH < 12 HOURS Performed at [facility], [address] [place], [place] [address]    Report Status PENDING  Incomplete  Blood Culture (routine x 2)     Status: None (Preliminary result)   Collection Time: 12/21/23  7:35 PM   Specimen: BLOOD  Result Value Ref Range Status   Specimen Description BLOOD BLOOD LEFT ARM  Final   Special Requests   Final    BOTTLES DRAWN AEROBIC AND ANAEROBIC Blood Culture adequate volume   Culture   Final    NO GROWTH < 12 HOURS Performed at [facility], [address] [place], [place] [address]    Report Status PENDING  Incomplete  MRSA Next Gen by PCR, Nasal     Status: None    Collection Time: 12/21/23  8:43 PM   Specimen: Nasal Mucosa; Nasal Swab  Result Value Ref Range Status   MRSA by PCR Next Gen NOT DETECTED NOT DETECTED Final    Comment: (NOTE) The GeneXpert MRSA Assay (FDA approved for NASAL specimens only), is one component of a comprehensive MRSA colonization surveillance program. It is not intended to diagnose MRSA infection nor to guide or monitor treatment for MRSA infections. Test performance is not FDA approved in patients less than 16 years old. Performed at [facility], [address] [place], [place] [address]          Radiology Studies: CT ABDOMEN PELVIS WO CONTRAST Result Date: 12/21/2023 CLINICAL DATA:  Abdominal pain, acute, nonlocalized EXAM: CT ABDOMEN AND PELVIS WITHOUT CONTRAST TECHNIQUE: Multidetector CT imaging of the abdomen and pelvis was performed following the standard protocol without IV contrast. RADIATION DOSE REDUCTION: This exam was performed according to the departmental dose-optimization program which includes automated exposure control, adjustment of the mA and/or kV according to patient size and/or use of iterative reconstruction technique. COMPARISON:  01/23/2022 FINDINGS: Of note, the lack of intravenous contrast limits evaluation of the solid organ parenchyma and vascularity. Lower chest: No focal airspace consolidation or pleural effusion. Hepatobiliary: No mass.2 cm cyst in the left hepatic lobe.No radiopaque stones or wall thickening of the gallbladder. No intrahepatic or extrahepatic biliary ductal dilation. Pancreas: No mass or main ductal dilation. No peripancreatic inflammation or fluid collection. Spleen: Normal size. No mass. Adrenals/Urinary Tract: No adrenal masses. No renal mass. No hydronephrosis or nephrolithiasis. The urinary bladder is distended without focal abnormality. Stomach/Bowel: The stomach is decompressed without focal abnormality. No small bowel wall thickening or inflammation. No small  bowel obstruction.Appendectomy Total colonic diverticulosis. No changes of acute diverticulitis. Fecal ball in the rectum, measuring 5.3 x 4.8 x 11.6 cm. Vascular/Lymphatic: No aortic aneurysm. Diffuse aortoiliac atherosclerosis. No intraabdominal or pelvic lymphadenopathy. Reproductive: Age-related atrophy of the uterus and ovaries. No concerning adnexal mass. No free pelvic fluid. Other: No pneumoperitoneum or ascites.  Moderate presacral stranding Musculoskeletal: No acute fracture or destructive lesion. 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 Mild bilateral hip osteoarthritis. IMPRESSION: 1. Moderate presacral stranding in the pelvis, which may be due to a fecal ball in the rectum, measuring 5.3 x 4.8 x 11.6 cm, as can be seen with early stercoral colitis. Alternatively, this could reflect  changes from an infectious or inflammatory proctitis or related to patient's volume status. 2. Total colonic diverticulosis. No changes of acute diverticulitis. Aortic Atherosclerosis (ICD10-I70.0). Electronically Signed   By: Rogelia Myers M.D.   On: 12/21/2023 18:31        Scheduled Meds:  acetaminophen   1,000 mg Oral Q8H   aspirin  EC  81 mg Oral BID   atorvastatin   20 mg Oral Daily   celecoxib   200 mg Oral BID   Chlorhexidine  Gluconate Cloth  6 each Topical Daily   enoxaparin (LOVENOX) injection  58 mg Subcutaneous Q24H   levothyroxine   88 mcg Oral Q0600   polyethylene glycol  17 g Oral BID   senna-docusate  1 tablet Oral BID   Continuous Infusions:  sodium chloride      cefTRIAXone (ROCEPHIN)  IV     dextrose  5% lactated ringers  125 mL/hr at 12/22/23 0700   insulin  5 Units/hr (12/22/23 0910)   metronidazole 500 mg (12/22/23 0911)     LOS: 1 day       Anthony CHRISTELLA Pouch, MD [facility] Pager [phone]  If 7PM-7AM, please contact night-coverage [email or web address] 12/22/2023, 9:51 AM

## 2023-12-22 NOTE — Evaluation (Signed)
 Occupational Therapy Evaluation Patient Details Name: Molly Wall MRN: [id number] DOB: Aug 06, 1945 Today's Date: 12/22/2023   History of Present Illness   Patient is a 78 year old female with constipation and urinary retention. Recent TKA 12/14/2023. PMH: type 2 diabetes, asthma, OSA, GERD, hyperlipidemia chronic low back pain with lumbar radiculopathy     Clinical Impressions Chart reviewed to date, pt greeted semi supine in bed, alert and oriented x4. PTA after TKA she was amb with RW, participating in HHPT; Prn assist for ADL/IADL but prior to surgery MOD I-I in ADL/IADL. Pt presents with deficits in strength, endurance, activity tolerance, balance, affecting safe and optimal ADL completion. Bed mobility completed with MIN A +2, STS with CGA, step pivot to bsc with RW with CGA. MAX A required for toileting for thoroughness, pt reports pain/skin irritation in this area; nurse notified. Pt then amb in hallway with PT (see their note for further details). Pt is performing ADL/functional mobility below PLOF, will benefit from acute OT to address functional deficits and to facilitate optimal ADL/functional mobility performance.      If plan is discharge home, recommend the following:   A little help with walking and/or transfers;A little help with bathing/dressing/bathroom;Assist for transportation;Help with stairs or ramp for entrance     Functional Status Assessment   Patient has had a recent decline in their functional status and demonstrates the ability to make significant improvements in function in a reasonable and predictable amount of time.     Equipment Recommendations   None recommended by OT     Recommendations for Other Services         Precautions/Restrictions   Precautions Precautions: Fall Recall of Precautions/Restrictions: Intact Restrictions Weight Bearing Restrictions Per Provider Order: Yes RLE Weight Bearing Per Provider Order: Weight  bearing as tolerated (previous TKA)     Mobility Bed Mobility Overal bed mobility: Needs Assistance Bed Mobility: Supine to Sit     Supine to sit: Min assist, +2 for physical assistance          Transfers Overall transfer level: Needs assistance Equipment used: Rolling walker (2 wheels) Transfers: Sit to/from Stand Sit to Stand: Contact guard assist           General transfer comment: intermittent vcs for technique      Balance Overall balance assessment: Needs assistance Sitting-balance support: Feet supported Sitting balance-Leahy Scale: Good     Standing balance support: Bilateral upper extremity supported, During functional activity, Reliant on assistive device for balance Standing balance-Leahy Scale: Fair                             ADL either performed or assessed with clinical judgement   ADL Overall ADL's : Needs assistance/impaired                         Toilet Transfer: Contact guard assist;BSC/3in1;Rolling walker (2 wheels) Toilet Transfer Details (indicate cue type and reason): step pivot to bsc Toileting- Clothing Manipulation and Hygiene: Maximal assistance;Sit to/from stand Toileting - Clothing Manipulation Details (indicate cue type and reason): for thoroughness     Functional mobility during ADLs: Contact guard assist;Rolling walker (2 wheels)       Vision Patient Visual Report: No change from baseline       Perception         Praxis         Pertinent Vitals/Pain Pain Assessment  Faces Pain Scale: Hurts little more Pain Location: R knee Pain Descriptors / Indicators: Discomfort Pain Intervention(s): Monitored during session, Limited activity within patient's tolerance, Repositioned     Extremity/Trunk Assessment Upper Extremity Assessment Upper Extremity Assessment: Overall WFL for tasks assessed   Lower Extremity Assessment Lower Extremity Assessment: Defer to PT evaluation RLE Deficits / Details: R  knee flexion around 80 degrees. weight bearing without knee buckling       Communication Communication Communication: No apparent difficulties   Cognition Arousal: Alert Behavior During Therapy: WFL for tasks assessed/performed Cognition: No apparent impairments                               Following commands: Intact       Cueing  General Comments   Cueing Techniques: Verbal cues  reports she feels continued bowel incontinence   Exercises Other Exercises Other Exercises: edu re role of OT, role of rehab, discharge recommendations, importance of continued mobility attempts including out of bed to chair   Shoulder Instructions      Home Living Family/patient expects to be discharged to:: Private residence Living Arrangements: Children Available Help at Discharge: Family Type of Home: House Home Access: Level entry     Home Layout: Two level;Able to live on main level with bedroom/bathroom     Bathroom Shower/Tub: Tub/shower unit;Walk-in shower   Bathroom Toilet: Standard Bathroom Accessibility: Yes   Home Equipment: Agricultural consultant (2 wheels);BSC/3in1   Additional Comments: home health PT      Prior Functioning/Environment Prior Level of Function : Independent/Modified Independent             Mobility Comments: amb with RW since dc from hospital, working with HHPT ADLs Comments: PRN assist for ADL since surgery, prior to that, she was MOD I-I in ADL/IADL    OT Problem List: Decreased strength;Decreased range of motion;Decreased activity tolerance;Impaired balance (sitting and/or standing);Pain;Decreased safety awareness;Decreased knowledge of use of DME or AE;Decreased knowledge of precautions   OT Treatment/Interventions: Self-care/ADL training;Therapeutic exercise;Neuromuscular education;Energy conservation;DME and/or AE instruction;Therapeutic activities;Patient/family education;Balance training      OT Goals(Current goals can be found  in the care plan section)   Acute Rehab OT Goals Patient Stated Goal: improve function OT Goal Formulation: With patient Time For Goal Achievement: 01/05/24 Potential to Achieve Goals: Good ADL Goals Pt Will Perform Grooming: with modified independence;sitting;standing Pt Will Perform Lower Body Dressing: with modified independence;sitting/lateral leans;sit to/from stand Pt Will Transfer to Toilet: with modified independence;ambulating Pt Will Perform Toileting - Clothing Manipulation and hygiene: with modified independence;sit to/from stand;sitting/lateral leans   OT Frequency:  Min 2X/week    Co-evaluation PT/OT/SLP Co-Evaluation/Treatment: Yes Reason for Co-Treatment: Complexity of the patient's impairments (multi-system involvement) PT goals addressed during session: Mobility/safety with mobility OT goals addressed during session: ADL's and self-care      AM-PAC OT 6 Clicks Daily Activity     Outcome Measure Help from another person eating meals?: None Help from another person taking care of personal grooming?: None Help from another person toileting, which includes using toliet, bedpan, or urinal?: A Lot Help from another person bathing (including washing, rinsing, drying)?: A Lot Help from another person to put on and taking off regular upper body clothing?: A Little Help from another person to put on and taking off regular lower body clothing?: A Lot 6 Click Score: 17   End of Session Equipment Utilized During Treatment: Rolling walker (2 wheels) Nurse  Communication: Mobility status  Activity Tolerance: Patient tolerated treatment well Patient left:  (sitting on bedside commode, daugther present, call bell in reach)  OT Visit Diagnosis: Unsteadiness on feet (R26.81);Muscle weakness (generalized) (M62.81);Pain;Other abnormalities of gait and mobility (R26.89) Pain - Right/Left: Right Pain - part of body: Knee                Time: 8890-8865 OT Time Calculation  (min): 25 min Charges:  OT General Charges $OT Visit: 1 Visit OT Evaluation $OT Eval Moderate Complexity: 1 Mod  Therisa Sheffield, OTD OTR/L  12/22/23, 1:05 PM

## 2023-12-22 NOTE — Progress Notes (Signed)
 PHARMACIST - PHYSICIAN COMMUNICATION  CONCERNING:  Enoxaparin (Lovenox) for DVT Prophylaxis    RECOMMENDATION: Patient was prescribed enoxaprin 40mg  q24 hours for VTE prophylaxis.   Filed Weights   12/21/23 2030 12/22/23 0325  Weight: 114.6 kg (252 lb 10.4 oz) 115.5 kg (254 lb 11.2 oz)    Body mass index is 42.38 kg/m.  Estimated Creatinine Clearance: 72.6 mL/min (by C-G formula based on SCr of 0.85 mg/dL).   Based on [facility] policy patient is candidate for enoxaparin 0.5mg /kg TBW SQ every 24 hours based on BMI being >30.  DESCRIPTION: Pharmacy has adjusted enoxaparin dose per [facility] policy.  Patient is now receiving enoxaparin 57.5 mg every 24 hours    Lum VEAR Mania, PharmD Clinical Pharmacist  12/22/2023 4:13 PM

## 2023-12-22 NOTE — Evaluation (Signed)
 Physical Therapy Evaluation Patient Details Name: Molly Wall MRN: [id number] DOB: 11/29/1945 Today's Date: 12/22/2023  History of Present Illness  Patient is a 78 year old female with constipation and urinary retention. Recent TKA 12/14/2023. PMH: type 2 diabetes, asthma, OSA, GERD, hyperlipidemia chronic low back pain with lumbar radiculopathy  Clinical Impression  Patient is agreeable to PT evaluation. She reports therapy has been going well at home following recent TKA. She has been using rolling walker for ambulation and daughter has been there to assist as needed.  Today the patient was able to stand several times with CGA using rolling walker. She ambulated in hallway with no dizziness, no loss of balance using rolling walker. Vitals stable throughout session. Recommend to continue PT to maximize independence and facilitate return to prior level of function. Patient hopeful for return home and to resume home health PT.       If plan is discharge home, recommend the following: A little help with walking and/or transfers;A little help with bathing/dressing/bathroom;Assist for transportation;Help with stairs or ramp for entrance   Can travel by private vehicle        Equipment Recommendations None recommended by PT  Recommendations for Other Services       Functional Status Assessment Patient has had a recent decline in their functional status and demonstrates the ability to make significant improvements in function in a reasonable and predictable amount of time.     Precautions / Restrictions Precautions Precautions: Fall Recall of Precautions/Restrictions: Intact Restrictions Weight Bearing Restrictions Per Provider Order: Yes RLE Weight Bearing Per Provider Order: Weight bearing as tolerated      Mobility  Bed Mobility Overal bed mobility: Needs Assistance Bed Mobility: Supine to Sit     Supine to sit: Min assist, +2 for physical assistance     General  bed mobility comments: assistance for RLE and trunk support. cues for technique    Transfers Overall transfer level: Needs assistance Equipment used: Rolling walker (2 wheels) Transfers: Sit to/from Stand, Bed to chair/wheelchair/BSC Sit to Stand: Contact guard assist   Step pivot transfers: Contact guard assist       General transfer comment: cues for hand placement for safety    Ambulation/Gait Ambulation/Gait assistance: Contact guard assist, Supervision Gait Distance (Feet): 100 Feet Assistive device: Rolling walker (2 wheels) Gait Pattern/deviations: Step-through pattern, Decreased stance time - right, Decreased stride length, Antalgic Gait velocity: decreased     General Gait Details: no loss of balance with hallway ambulation. encourage patient to continue using rolling walker for safety and fall prevention at home  Stairs            Wheelchair Mobility     Tilt Bed    Modified Rankin (Stroke Patients Only)       Balance Overall balance assessment: Needs assistance Sitting-balance support: No upper extremity supported, Feet supported Sitting balance-Leahy Scale: Good     Standing balance support: Bilateral upper extremity supported Standing balance-Leahy Scale: Fair                               Pertinent Vitals/Pain Pain Assessment Pain Assessment: Faces Faces Pain Scale: Hurts little more Pain Location: peri-area and right knee Pain Descriptors / Indicators: Discomfort Pain Intervention(s): Limited activity within patient's tolerance, Monitored during session, Repositioned    Home Living Family/patient expects to be discharged to:: Private residence Living Arrangements: Children Available Help at Discharge: Family Type of  Home: House Home Access: Level entry       Home Layout: Two level;Able to live on main level with bedroom/bathroom Home Equipment: Agricultural consultant (2 wheels);BSC/3in1 Additional Comments: home health PT     Prior Function Prior Level of Function : Independent/Modified Independent             Mobility Comments: amb with RW since dc from hospital, working with HHPT ADLs Comments: PRN assist for ADL since surgery, prior to that, she was MOD I-I in ADL/IADL     Extremity/Trunk Assessment   Upper Extremity Assessment Upper Extremity Assessment: Overall WFL for tasks assessed    Lower Extremity Assessment Lower Extremity Assessment: Defer to PT evaluation RLE Deficits / Details: R knee flexion around 80 degrees. weight bearing without knee buckling       Communication   Communication Communication: No apparent difficulties    Cognition Arousal: Alert Behavior During Therapy: WFL for tasks assessed/performed   PT - Cognitive impairments: No apparent impairments                       PT - Cognition Comments: mild delay with command following at times Following commands: Intact       Cueing Cueing Techniques: Verbal cues     General Comments General comments (skin integrity, edema, etc.): reports she feels continued bowel incontinence    Exercises     Assessment/Plan    PT Assessment Patient needs continued PT services  PT Problem List Decreased strength;Decreased range of motion;Decreased activity tolerance;Decreased balance;Decreased mobility;Pain       PT Treatment Interventions DME instruction;Gait training;Stair training;Functional mobility training;Therapeutic activities;Therapeutic exercise;Balance training;Neuromuscular re-education;Cognitive remediation;Patient/family education    PT Goals (Current goals can be found in the Care Plan section)  Acute Rehab PT Goals Patient Stated Goal: home tomorrow PT Goal Formulation: With patient Time For Goal Achievement: 01/05/24 Potential to Achieve Goals: Fair    Frequency Min 2X/week     Co-evaluation PT/OT/SLP Co-Evaluation/Treatment: Yes Reason for Co-Treatment: Complexity of the patient's  impairments (multi-system involvement) PT goals addressed during session: Mobility/safety with mobility OT goals addressed during session: ADL's and self-care       AM-PAC PT 6 Clicks Mobility  Outcome Measure Help needed turning from your back to your side while in a flat bed without using bedrails?: None Help needed moving from lying on your back to sitting on the side of a flat bed without using bedrails?: A Little Help needed moving to and from a bed to a chair (including a wheelchair)?: A Little Help needed standing up from a chair using your arms (e.g., wheelchair or bedside chair)?: A Little Help needed to walk in hospital room?: A Little Help needed climbing 3-5 steps with a railing? : A Little 6 Click Score: 19    End of Session   Activity Tolerance: Patient tolerated treatment well Patient left: in bed;Other (comment) (BSC with daughter in the room) Nurse Communication: Mobility status PT Visit Diagnosis: Difficulty in walking, not elsewhere classified (R26.2);Muscle weakness (generalized) (M62.81)    Time: 8890-8865 PT Time Calculation (min) (ACUTE ONLY): 25 min   Charges:   PT Evaluation $PT Eval Moderate Complexity: 1 Mod   PT General Charges $$ ACUTE PT VISIT: 1 Visit        Randine Essex, PT, MPT   Randine LULLA Essex 12/22/2023, 1:00 PM

## 2023-12-23 ENCOUNTER — Other Ambulatory Visit: Payer: Self-pay

## 2023-12-23 DIAGNOSIS — E1110 Type 2 diabetes mellitus with ketoacidosis without coma: Secondary | ICD-10-CM | POA: Diagnosis not present

## 2023-12-23 DIAGNOSIS — T402X5A Adverse effect of other opioids, initial encounter: Secondary | ICD-10-CM

## 2023-12-23 DIAGNOSIS — K5903 Drug induced constipation: Secondary | ICD-10-CM | POA: Diagnosis not present

## 2023-12-23 DIAGNOSIS — K5289 Other specified noninfective gastroenteritis and colitis: Secondary | ICD-10-CM | POA: Diagnosis not present

## 2023-12-23 DIAGNOSIS — R338 Other retention of urine: Secondary | ICD-10-CM | POA: Diagnosis not present

## 2023-12-23 LAB — PHOSPHORUS: Phosphorus: 2.6 mg/dL (ref 2.5–4.6)

## 2023-12-23 LAB — BASIC METABOLIC PANEL WITH GFR
Anion gap: 7 (ref 5–15)
BUN: 16 mg/dL (ref 8–23)
CO2: 23 mmol/L (ref 22–32)
Calcium: 8.3 mg/dL — ABNORMAL LOW (ref 8.9–10.3)
Chloride: 102 mmol/L (ref 98–111)
Creatinine, Ser: 0.79 mg/dL (ref 0.44–1.00)
GFR, Estimated: >60 mL/min (ref >60)
Glucose, Bld: 291 mg/dL — ABNORMAL HIGH (ref 70–99)
Potassium: 4.5 mmol/L (ref 3.5–5.1)
Sodium: 132 mmol/L — ABNORMAL LOW (ref 135–145)

## 2023-12-23 LAB — CBC
HCT: 27.5 % — ABNORMAL LOW (ref 36.0–46.0)
Hemoglobin: 8.9 g/dL — ABNORMAL LOW (ref 12.0–15.0)
MCH: 27.6 pg (ref 26.0–34.0)
MCHC: 32.4 g/dL (ref 30.0–36.0)
MCV: 85.1 fL (ref 80.0–100.0)
Platelets: 274 K/uL (ref 150–400)
RBC: 3.23 MIL/uL — ABNORMAL LOW (ref 3.87–5.11)
RDW: 14.9 % (ref 11.5–15.5)
WBC: 11.1 K/uL — ABNORMAL HIGH (ref 4.0–10.5)
nRBC: 0 % (ref 0.0–0.2)

## 2023-12-23 LAB — GLUCOSE, CAPILLARY
Glucose-Capillary: 272 mg/dL — ABNORMAL HIGH (ref 70–99)
Glucose-Capillary: 265 mg/dL — ABNORMAL HIGH (ref 70–99)
Glucose-Capillary: 329 mg/dL — ABNORMAL HIGH (ref 70–99)
Glucose-Capillary: 362 mg/dL — ABNORMAL HIGH (ref 70–99)

## 2023-12-23 LAB — MAGNESIUM: Magnesium: 2.3 mg/dL (ref 1.7–2.4)

## 2023-12-23 MED ORDER — SENNOSIDES-DOCUSATE SODIUM 8.6-50 MG PO TABS
1.0000 | ORAL_TABLET | Freq: Two times a day (BID) | ORAL | 0 refills | Status: AC
  Filled 2023-12-23: qty 14, 7d supply, fill #0

## 2023-12-23 MED ORDER — CIPROFLOXACIN HCL 500 MG PO TABS: 500.0000 mg | ORAL_TABLET | Freq: Two times a day (BID) | ORAL | Status: DC

## 2023-12-23 MED ORDER — POLYETHYLENE GLYCOL 3350 17 GM/SCOOP PO POWD
17.0000 g | Freq: Two times a day (BID) | ORAL | 0 refills | Status: AC
  Filled 2023-12-23: qty 238, 7d supply, fill #0

## 2023-12-23 MED ORDER — CIPROFLOXACIN HCL 500 MG PO TABS
500.0000 mg | ORAL_TABLET | Freq: Two times a day (BID) | ORAL | 0 refills | Status: AC
  Filled 2023-12-23: qty 8, 4d supply, fill #0

## 2023-12-23 MED ORDER — INSULIN ASPART 100 UNIT/ML IJ SOLN
0.0000 [IU] | Freq: Three times a day (TID) | INTRAMUSCULAR | Status: DC
  Administered 2023-12-23: 11 [IU] via SUBCUTANEOUS
  Administered 2023-12-23: 15 [IU] via SUBCUTANEOUS
  Filled 2023-12-23 (×2): qty 1

## 2023-12-23 MED ORDER — BISACODYL 10 MG RE SUPP
10.0000 mg | Freq: Every day | RECTAL | 0 refills | Status: DC | PRN
  Filled 2023-12-23: qty 12, 12d supply, fill #0

## 2023-12-23 NOTE — Progress Notes (Signed)
 Mobility Specialist - Progress Note   12/23/23 1500  Mobility  Activity Ambulated with assistance  Level of Assistance Independent after set-up  Assistive Device Front wheel walker  Distance Ambulated (ft) 180 ft  Range of Motion/Exercises Active  Activity Response Tolerated well  Mobility visit 1 Mobility  Mobility Specialist Start Time (ACUTE ONLY) 1417  Mobility Specialist Stop Time (ACUTE ONLY) 1447  Mobility Specialist Time Calculation (min) (ACUTE ONLY) 30 min   Pt was semi- supine in the bed upon entry with guest in room. Pt agreed to mobility. Pt was able to independently get to EOB. Pt was able to pivot to [facility] independently. Pt was able to STS using CGA and a 2 [employer]. Pt ambulated well. During mobility O2 was taken as a precaution. Pt was able to receive cues well and spatial awareness. After activity pt returned to room, and back to EOB with guest in the room. Needs are in reach.  Clem Rodes Mobility Specialist 12/23/23, 3:38 PM

## 2023-12-23 NOTE — Care Management Important Message (Signed)
 Important Message  Patient Details  Name: Molly Wall MRN: [id number] Date of Birth: 01-Apr-1945   Important Message Given:  Yes - Medicare IM     Molly Wall 12/23/2023, 4:15 PM

## 2023-12-23 NOTE — Inpatient Diabetes Management (Addendum)
 Inpatient Diabetes Program Recommendations  AACE/ADA: New Consensus Statement on Inpatient Glycemic Control   Target Ranges:  Prepandial:   less than 140 mg/dL      Peak postprandial:   less than 180 mg/dL (1-2 hours)      Critically ill patients:  140 - 180 mg/dL    Latest Reference Range & Units 12/23/23 11:45  Glucose-Capillary 70 - 99 mg/dL 670 (H)    Latest Reference Range & Units 12/22/23 08:07 12/22/23 09:09 12/22/23 10:19 12/22/23 11:21 12/22/23 12:17 12/22/23 13:18 12/22/23 16:23 12/22/23 20:01 12/22/23 23:51 12/23/23 04:01  Glucose-Capillary 70 - 99 mg/dL 821 (H) 804 (H) 870 (H) 159 (H) 230 (H) 182 (H) 304 (H) 373 (H) 318 (H) 272 (H)   Review of Glycemic Control  Diabetes history: DM2 Outpatient Diabetes medications: V-Go 30 insulin  pump (provides 30 units of basal every 24 hours), uses 7 clicks with breakfast, 6 clicks with lunch, and 7 clicks with supper (each click is 2 units of insulin ), Metformin  1000 mg QAM, Humalog  as needed 1-3 times a day Current orders for Inpatient glycemic control: Lantus  35 units daily, Novolog  0-6 units Q4H   Inpatient Diabetes Program Recommendations:    Insulin : IF patient will remain inpatient today, please consider increasing Lantus  to 50 units daily and increase Novolog  0-9 units Q4H.   IF patient is going to be discharged today, please discontinue the Latus so patient can resume her insulin  pump when she gets home today (still order meal coverage to help with post prandial glucose elevation).  NOTE: Received chat message from [place], [place] regarding patient's glucose control. Spoke with patient and daughter over the phone regarding glycemic control. Patient and daughter are upset about how high patient's glucose has been over the past 12 hours. They states that they talked with multiple nurses last night and this morning and kept getting our hands are tied. It is hospital policy. They report that asked to talk to the doctor and was told that the  doctor was in the ED or Do not disturb. So they are very frustrated and feel like no one has listened to them or advocated for them. Apologized about their experience and encouraged them to ask to talk with the nursing unit director or charge nurse to discuss their concerns.  Patient has not eaten much and does not have a good appetite yet. CBGs 272-373 mg/dl over the past 12 hours. Discussed recommendation to increase Lantus  and increase Novolog  correction since patient is not eating well will not ask for meal coverage at this time. Patient is unsure if she will go home or not today. Explained that if she will be discharged today, then I would recommend she not get the Lantus  so she can resume her V-Go insulin  pump when she gets home today. Patient's daughter is hopeful that patient will be discharged so they can do a better job of getting her glucose controlled. Informed patient and daughter that I would reach out to Dr. Lue regarding increasing Lantus  if she will remain inpatient and about increasing Novolog  correction scale. Patient's daughter asked about more information on the OmniPod insulin  pump (patient has been ordered the OmniPod insulin  pump and insurance has approved it).  Went to patient's room to discuss the OmniPod insulin  pump in more detail and how it works with the Dexcom G7 CGM to get DM under better control. Patient states she has not received the OmniPod pump yet and she is not sure if Dr. Cherilyn needs to do  anything or not. Encouraged her to call Dr. Kathie office and ask about them sending the prescription to her pharmacy and ask about setting up an appointment for insulin  pump training so she can get started on it in the near future. Answered all patient and daughter's questions. Nurse checked glucose at 11:45 and CBG 329. Patient will be getting Novolog  correction on Moderate scale. Will continue to follow while inpatient.   Thanks, Earnie Gainer, RN, MSN, [facility] Diabetes  Coordinator Inpatient Diabetes Program [phone] (Team Pager from 8am to 5pm)

## 2023-12-23 NOTE — Plan of Care (Signed)
 Nutrition Education Note   RD consulted for nutrition education regarding diabetes.   Lab Results  Component Value Date   HGBA1C 6.9 (H) 12/07/2023    RD provided Nutrition and Type II Diabetes handout from the [employer]. Discussed different food groups and their effects on blood sugar, emphasizing carbohydrate-containing foods. Provided list of carbohydrates and recommended serving sizes of common foods.  Discussed importance of controlled and consistent carbohydrate intake throughout the day. Provided examples of ways to balance meals/snacks and encouraged intake of high-fiber, whole grain complex carbohydrates. Teach back method used.  Expect good compliance.  Body mass index is 42.38 kg/m. Pt meets criteria for obesity based on current BMI.  Labs and medications reviewed.   Referral sent to [facility]. No further nutrition interventions warranted at this time. RD contact information provided. If additional nutrition issues arise, please re-consult RD.  Augustin Shams MS, RD, LDN If unable to be reached, please send secure chat to RD inpatient available from 8:00a-4:00p daily

## 2023-12-23 NOTE — Discharge Summary (Signed)
 Physician Discharge Summary  Molly Molly Wall FMW:[id number] DOB: 20-Dec-1945 DOA: 12/21/2023  PCP: Gretta Comer POUR, NP  Admit date: 12/21/2023 Discharge date: 12/23/2023  Admitted From: Home Disposition: Home  Recommendations for Outpatient Follow-up:  Follow up with PCP in 1-2 weeks Follow-up with orthopedic surgery as discussed  Home Health: None Equipment/Devices: None  Discharge Condition: Stable CODE STATUS: Full Diet recommendation: Low-salt low-fat low-carb diet  Brief/Interim Summary: Molly Molly Wall Molly Wall is a 78 y.o. female with medical history significant of type 2 diabetes, asthma, OSA, GERD, hyperlipidemia chronic low back pain with lumbar radiculopathy, recent right total knee replacement with Dr. Mardee on 12/14/2023 who presented back to the ED for evaluation of lower abdominal pain and severe postop constipation since her knee replacement.  Patient admitted as above with recurrent worsening constipation in the setting of opiates after recent surgery.  Patient seen in the ED over the weekend with recommendations for over-the-counter medications with no real improvement, subsequently worsened 24 hours prior to hospitalization with profoundly worsening abdominal pain and urinary obstruction.  I did take patient and Foley placed, patient was given multiple medications with marked improvement in patient's constipation.  Initial imaging was concerning for possible colitis although likely reactive we discussed at length with family and patient to her agreeable to continue antibiotic course to cover any possible underlying infection.  At this time patient is ambulating without difficulty, pain is well-controlled off narcotics, having multiple bowel movements today and no longer having any abdominal pain or distention with episodes of urination without difficulty or hematuria.  At discharge patient will be issued a thigh-high compression hose consistent with orthopedic  which she has but otherwise she is stable and agreeable for discharge home.  Noted to TTE in the interim and likely exacerbated by above which is now back to baseline and well-controlled.  Recommend to resume home glucose pump and continuous glucose monitor at home.  Discharge Diagnoses:  Principal Problem:   DKA (diabetic ketoacidosis) (HCC) Active Problems:   Opioid-induced constipation   Stercoral colitis   Acute metabolic encephalopathy   Acute urinary retention    Discharge Instructions  Discharge Instructions     Ambulatory referral to Nutrition and Diabetic Education   Complete by: As directed    Patient will be starting OmniPod pump soon and would like further education on Carb Modified diet.   Call MD for:  difficulty breathing, headache or visual disturbances   Complete by: As directed    Call MD for:  extreme fatigue   Complete by: As directed    Call MD for:  hives   Complete by: As directed    Call MD for:  persistant dizziness or light-headedness   Complete by: As directed    Call MD for:  persistant nausea and vomiting   Complete by: As directed    Call MD for:  severe uncontrolled pain   Complete by: As directed    Call MD for:  temperature >100.4   Complete by: As directed    Diet - low sodium heart healthy   Complete by: As directed    Increase activity slowly   Complete by: As directed    No wound care   Complete by: As directed       Allergies as of 12/23/2023       Reactions   Iodine-131 Anaphylaxis   Pt states when she was 21=she drank an experimental Iodinated Radioisotope for her thyroid . She described it was a small amount  in a milk carton in [place] . She states they stopped using it shortly after because the trial wasn't successful. SPM   Prednisone     Make sugar jump way up.        Medication List     STOP taking these medications    oxyCODONE  5 MG immediate release tablet Commonly known as: Oxy IR/ROXICODONE        TAKE  these medications    acetaminophen  650 MG CR tablet Commonly known as: TYLENOL  Take 1-2 tablets by mouth 3 (three) times daily as needed for pain.   alendronate  70 MG tablet Commonly known as: Fosamax  Take 1 tablet (70 mg total) by mouth once a week. Take with a full glass of water on an empty stomach.   aspirin  EC 81 MG tablet Take 81 mg by mouth 2 (two) times daily. Swallow whole.   atorvastatin  20 MG tablet Commonly known as: LIPITOR TAKE 1 TABLET BY MOUTH EVERY DAY FOR CHOLESTEROL   celecoxib  200 MG capsule Commonly known as: CELEBREX  Take 1 capsule (200 mg total) by mouth 2 (two) times daily.   ciprofloxacin 500 MG tablet Commonly known as: CIPRO Take 1 tablet (500 mg total) by mouth 2 (two) times daily for 4 days.   Dulcolax 5 MG EC tablet Generic drug: bisacodyl  Take 1 tablet (5 mg total) by mouth daily as needed for moderate constipation.   bisacodyl  10 MG suppository Commonly known as: DULCOLAX Place 1 suppository (10 mg total) rectally daily as needed for severe constipation.   Glucagon  3 MG/DOSE Powd Place 3 mg into the nose once as needed for up to 1 dose.   insulin  lispro 100 UNIT/ML KwikPen Commonly known as: HumaLOG  KwikPen INJECT 25 UNITS INTO THE SKIN 3 (THREE) TIMES DAILY WITH MEALS   levothyroxine  88 MCG tablet Commonly known as: SYNTHROID  TAKE 1 AND 1/2 TABLET BY MOUTH EVERY SUNDAY. TAKE 1 TABLET BY MOUTH MON THROUGH SAT. TAKE ON AN EMPTY STOMACH WITH WATER ONLY. NO FOOD OR OTHER MEDICATIONS FOR 30 MINUTES.   Metamucil Smooth Texture 58.6 % powder Generic drug: psyllium Take 1 packet by mouth 3 (three) times daily.   metFORMIN  500 MG 24 hr tablet Commonly known as: GLUCOPHAGE -XR TAKE 2 TABLETS (1,000 MG TOTAL) BY MOUTH DAILY WITH BREAKFAST. FOR DIABETES.   NovoLOG  100 UNIT/ML injection Generic drug: insulin  aspart INJECT UP TO 70 UNITS INTO THE SKIN ONCE FOR 1 DOSE. VIA PUMP   Ozempic  (1 MG/DOSE) 4 MG/3ML Sopn Generic drug: Semaglutide  (1  MG/DOSE) INJECT 1 MG INTO THE SKIN ONE TIME PER WEEK   polyethylene glycol 17 g packet Commonly known as: MIRALAX / GLYCOLAX Take 17 g by mouth 2 (two) times daily.   senna-docusate 8.6-50 MG tablet Commonly known as: Senokot-S Take 1 tablet by mouth 2 (two) times daily for 7 days.   traMADol  50 MG tablet Commonly known as: ULTRAM  Take 1-2 tablets (50-100 mg total) by mouth every 4 (four) hours as needed for moderate pain (pain score 4-6).       ASK your doctor about these medications    magnesium  citrate Soln Take 296 mLs (1 Bottle total) by mouth once for 1 dose. Ask about: Should I take this medication?        Allergies  Allergen Reactions   Iodine-131 Anaphylaxis    Pt states when she was 21=she drank an experimental Iodinated Radioisotope for her thyroid . She described it was a small amount in a milk carton in [place] . She states they stopped  using it shortly after because the trial wasn't successful. SPM   Prednisone      Make sugar jump way up.    Procedures/Studies: CT Angio Chest Pulmonary Embolism (PE) W or WO Contrast Result Date: 12/22/2023 EXAM: CTA of the Chest with contrast for PE 12/22/2023 06:25:44 PM TECHNIQUE: CTA of the chest was performed after the administration of 75 cc iohexol  (OMNIPAQUE ) 350 MG/ML injection. Multiplanar reformatted images are provided for review. MIP images are provided for review. Automated exposure control, iterative reconstruction, and/or weight based adjustment of the mA/kV was utilized to reduce the radiation dose to as low as reasonably achievable. COMPARISON: Chest radiograph of 02/11/2023. CLINICAL HISTORY: Shortness of breath. Pulmonary embolism (PE) suspected, high prob. FINDINGS: PULMONARY ARTERIES: Contrast bolus is diagnostic to the proximal segmental level. No central pulmonary embolus. Cannot assess the distal segment of segmental branches. Main pulmonary artery is normal in caliber. MEDIASTINUM: Aortic atherosclerosis.  No aortic aneurysm. The heart is normal in size. There are coronary artery calcifications. LYMPH NODES: No mediastinal, hilar or axillary lymphadenopathy. LUNGS AND PLEURA: Bronchial thickening with heterogeneous pulmonary parenchyma. No focal consolidation or pulmonary edema. No pleural effusion or pneumothorax. UPPER ABDOMEN: Simple cyst in the subcapsular left lobe of the liver. SOFT TISSUES AND BONES: Mild thoracic spondylosis. No acute soft tissue abnormality. IMPRESSION: 1. No central pulmonary embolism. 2. Bronchial thickening with heterogeneous pulmonary parenchyma suggesting small airways disease. 3. Coronary artery calcifications. Aortic atherosclerosis (ICD10-170.0) Electronically signed by: Andrea Gasman MD 12/22/2023 07:25 PM EDT RP Workstation: [id number]   CT ABDOMEN PELVIS WO CONTRAST Result Date: 12/21/2023 CLINICAL DATA:  Abdominal pain, acute, nonlocalized EXAM: CT ABDOMEN AND PELVIS WITHOUT CONTRAST TECHNIQUE: Multidetector CT imaging of the abdomen and pelvis was performed following the standard protocol without IV contrast. RADIATION DOSE REDUCTION: This exam was performed according to the departmental dose-optimization program which includes automated exposure control, adjustment of the mA and/or kV according to patient size and/or use of iterative reconstruction technique. COMPARISON:  01/23/2022 FINDINGS: Of note, the lack of intravenous contrast limits evaluation of the solid organ parenchyma and vascularity. Lower chest: No focal airspace consolidation or pleural effusion. Hepatobiliary: No mass.2 cm cyst in the left hepatic lobe.No radiopaque stones or Molly Wall thickening of the gallbladder. No intrahepatic or extrahepatic biliary ductal dilation. Pancreas: No mass or main ductal dilation. No peripancreatic inflammation or fluid collection. Spleen: Normal size. No mass. Adrenals/Urinary Tract: No adrenal masses. No renal mass. No hydronephrosis or nephrolithiasis. The urinary bladder is  distended without focal abnormality. Stomach/Bowel: The stomach is decompressed without focal abnormality. No small bowel Molly Wall thickening or inflammation. No small bowel obstruction.Appendectomy Total colonic diverticulosis. No changes of acute diverticulitis. Fecal ball in the rectum, measuring 5.3 x 4.8 x 11.6 cm. Vascular/Lymphatic: No aortic aneurysm. Diffuse aortoiliac atherosclerosis. No intraabdominal or pelvic lymphadenopathy. Reproductive: Age-related atrophy of the uterus and ovaries. No concerning adnexal mass. No free pelvic fluid. Other: No pneumoperitoneum or ascites.  Moderate presacral stranding Musculoskeletal: No acute fracture or destructive lesion. 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 Mild bilateral hip osteoarthritis. IMPRESSION: 1. Moderate presacral stranding in the pelvis, which may be due to a fecal ball in the rectum, measuring 5.3 x 4.8 x 11.6 cm, as can be seen with early stercoral colitis. Alternatively, this could reflect changes from an infectious or inflammatory proctitis or related to patient's volume status. 2. Total colonic diverticulosis. No changes of acute diverticulitis. Aortic Atherosclerosis (ICD10-I70.0). Electronically Signed   By: Rogelia  Carlean M.D.   On: 12/21/2023 18:31   DG Knee Right Port Result Date: 12/14/2023 CLINICAL DATA:  Status post total knee replacement EXAM: PORTABLE RIGHT KNEE - 1-2 VIEW COMPARISON:  12/16/2022 FINDINGS: Right total knee prosthesis is well seated without periprosthetic fracture or lucency. Subcutaneous emphysema, surgical drains, and skin staples are consistent with immediate postop status. IMPRESSION: Uncomplicated right total knee prosthesis with immediate postop changes. Electronically Signed   By: Aliene Lloyd M.D.   On: 12/14/2023 12:23   Sleep Study Documents Result Date: 12/09/2023 Ordered by an unspecified provider.    Subjective: No acute issues or events  overnight tolerating p.o. well, multiple bowel movements now with no further constipation or abdominal pain, urinating today without difficulty ambulating with physical therapy appears to be back to prior baseline.  Glucose currently well-controlled and otherwise stable and agreeable for discharge   Discharge Exam: Vitals:   12/23/23 0503 12/23/23 0754  BP: (!) 140/59 (!) 129/59  Pulse: 91 86  Resp: 18 15  Temp: 98.3 F (36.8 C) 98.6 F (37 C)  SpO2: 98% 96%   Vitals:   12/22/23 2131 12/23/23 0112 12/23/23 0503 12/23/23 0754  BP: 138/63 (!) 137/53 (!) 140/59 (!) 129/59  Pulse: (!) 105 95 91 86  Resp: 20 18 18 15   Temp: 98.4 F (36.9 C) 99.1 F (37.3 C) 98.3 F (36.8 C) 98.6 F (37 C)  TempSrc:  Oral    SpO2: 100% 96% 98% 96%  Weight:      Height:        General: Pt is alert, awake, not in acute distress Cardiovascular: RRR, S1/S2 +, no rubs, no gallops Respiratory: CTA bilaterally, no wheezing, no rhonchi Abdominal: Soft, NT, ND, bowel sounds + Extremities: no edema, no cyanosis    The results of significant diagnostics from this hospitalization (including imaging, microbiology, ancillary and laboratory) are listed below for reference.     Microbiology: Recent Results (from the past 240 hours)  Blood Culture (routine x 2)     Status: None (Preliminary result)   Collection Time: 12/21/23  7:35 PM   Specimen: BLOOD  Result Value Ref Range Status   Specimen Description BLOOD RIGHT ANTECUBITAL  Final   Special Requests   Final    BOTTLES DRAWN AEROBIC AND ANAEROBIC Blood Culture adequate volume   Culture   Final    NO GROWTH 2 DAYS Performed at [facility], [address] [place], [place] [address]    Report Status PENDING  Incomplete  Blood Culture (routine x 2)     Status: None (Preliminary result)   Collection Time: 12/21/23  7:35 PM   Specimen: BLOOD  Result Value Ref Range Status   Specimen Description BLOOD BLOOD LEFT ARM  Final   Special  Requests   Final    BOTTLES DRAWN AEROBIC AND ANAEROBIC Blood Culture adequate volume   Culture   Final    NO GROWTH 2 DAYS Performed at [facility], [address] [place], [place] [address]    Report Status PENDING  Incomplete  MRSA Next Gen by PCR, Nasal     Status: None   Collection Time: 12/21/23  8:43 PM   Specimen: Nasal Mucosa; Nasal Swab  Result Value Ref Range Status   MRSA by PCR Next Gen NOT DETECTED NOT DETECTED Final    Comment: (NOTE) The GeneXpert MRSA Assay (FDA approved for NASAL specimens only), is one component of a comprehensive MRSA colonization surveillance program. It is not intended  to diagnose MRSA infection nor to guide or monitor treatment for MRSA infections. Test performance is not FDA approved in patients less than 75 years old. Performed at [facility], [address] [place], [place] [address]      Labs: BNP (last 3 results) No results for input(s): BNP in the last 8760 hours. Basic Metabolic Panel: Recent Labs  Lab 12/21/23 2054 12/21/23 2306 12/22/23 0334 12/22/23 0717 12/23/23 0351  NA 133* 132* 133* 134* 132*  K 4.3 4.1 4.3 4.5 4.5  CL 97* 97* 101 100 102  CO2 19* 22 23 25 23   GLUCOSE 223* 126* 165* 171* 291*  BUN 15 16 16 16 16   CREATININE 0.64 0.73 0.69 0.85 0.79  CALCIUM  8.6* 8.3* 8.3* 8.6* 8.3*  MG  --   --   --   --  2.3  PHOS  --   --   --   --  2.6   Liver Function Tests: Recent Labs  Lab 12/21/23 1600  AST 24  ALT 17  ALKPHOS 105  BILITOT 2.0*  PROT 6.9  ALBUMIN 3.7   No results for input(s): LIPASE, AMYLASE in the last 168 hours. Recent Labs  Lab 12/21/23 2054  AMMONIA <13   CBC: Recent Labs  Lab 12/21/23 1600 12/21/23 2054 12/22/23 0717 12/23/23 0351  WBC 15.3* 15.5* 13.0* 11.1*  NEUTROABS 14.2*  --   --   --   HGB 11.3* 11.2* 9.8* 8.9*  HCT 36.0 35.2* 30.8* 27.5*  MCV 86.1 86.1 85.8 85.1  PLT 294 275 278 274   Cardiac Enzymes: No results for input(s): CKTOTAL,  CKMB, CKMBINDEX, TROPONINI in the last 168 hours. BNP: Invalid input(s): POCBNP CBG: Recent Labs  Lab 12/22/23 2351 12/23/23 0401 12/23/23 0755 12/23/23 1145 12/23/23 1535  GLUCAP 318* 272* 265* 329* 362*   D-Dimer No results for input(s): DDIMER in the last 72 hours. Hgb A1c No results for input(s): HGBA1C in the last 72 hours. Lipid Profile No results for input(s): CHOL, HDL, LDLCALC, TRIG, CHOLHDL, LDLDIRECT in the last 72 hours. Thyroid  function studies Recent Labs    12/21/23 2306  TSH 11.837*   Anemia work up No results for input(s): VITAMINB12, FOLATE, FERRITIN, TIBC, IRON, RETICCTPCT in the last 72 hours. Urinalysis    Component Value Date/Time   COLORURINE YELLOW (A) 12/21/2023 1755   APPEARANCEUR HAZY (A) 12/21/2023 1755   LABSPEC 1.020 12/21/2023 1755   PHURINE 5.0 12/21/2023 1755   GLUCOSEU >=500 (A) 12/21/2023 1755   HGBUR SMALL (A) 12/21/2023 1755   BILIRUBINUR NEGATIVE 12/21/2023 1755   BILIRUBINUR Negative 10/13/2018 1142   KETONESUR 80 (A) 12/21/2023 1755   PROTEINUR NEGATIVE 12/21/2023 1755   UROBILINOGEN 0.2 10/13/2018 1142   NITRITE NEGATIVE 12/21/2023 1755   LEUKOCYTESUR NEGATIVE 12/21/2023 1755   Sepsis Labs Recent Labs  Lab 12/21/23 1600 12/21/23 2054 12/22/23 0717 12/23/23 0351  WBC 15.3* 15.5* 13.0* 11.1*   Microbiology Recent Results (from the past 240 hours)  Blood Culture (routine x 2)     Status: None (Preliminary result)   Collection Time: 12/21/23  7:35 PM   Specimen: BLOOD  Result Value Ref Range Status   Specimen Description BLOOD RIGHT ANTECUBITAL  Final   Special Requests   Final    BOTTLES DRAWN AEROBIC AND ANAEROBIC Blood Culture adequate volume   Culture   Final    NO GROWTH 2 DAYS Performed at [facility], [address] [place], [place] [address]    Report Status  PENDING  Incomplete  Blood Culture (routine x 2)     Status: None (Preliminary result)   Collection  Time: 12/21/23  7:35 PM   Specimen: BLOOD  Result Value Ref Range Status   Specimen Description BLOOD BLOOD LEFT ARM  Final   Special Requests   Final    BOTTLES DRAWN AEROBIC AND ANAEROBIC Blood Culture adequate volume   Culture   Final    NO GROWTH 2 DAYS Performed at [facility], [address], [place], [place] [address]    Report Status PENDING  Incomplete  MRSA Next Gen by PCR, Nasal     Status: None   Collection Time: 12/21/23  8:43 PM   Specimen: Nasal Mucosa; Nasal Swab  Result Value Ref Range Status   MRSA by PCR Next Gen NOT DETECTED NOT DETECTED Final    Comment: (NOTE) The GeneXpert MRSA Assay (FDA approved for NASAL specimens only), is one component of a comprehensive MRSA colonization surveillance program. It is not intended to diagnose MRSA infection nor to guide or monitor treatment for MRSA infections. Test performance is not FDA approved in patients less than 66 years old. Performed at [facility], [address], [place], [place] [address]      Time coordinating discharge: Over 30 minutes  SIGNED:   Elsie JAYSON Mont[place], DO [facility] 12/23/2023, 3:36 PM Pager   If 7PM-7AM, please contact night-coverage [email or web address]

## 2023-12-23 NOTE — TOC Initial Note (Addendum)
 Transition of Care [facility]) - Initial/Assessment Note    Patient Details  Name: Molly Wall MRN: [id number] Date of Birth: [birth year]  Transition of Care [facility]) CM/SW Contact:    Corean ONEIDA Haddock, RN Phone Number: 12/23/2023, 2:35 PM  Clinical Narrative:                  Admitted for: DKA Admitted from: home with children PCP: Gretta  Current home health/prior home health/DME: HH PT through [facility].  [place]  with [facility] aware of admission. RW, bariatric BSC  Therapy recommending HH.  Will require resumption orders at discharge   Update:  DC  orders in.  [place]  with [facility] notified.  Requested MD enter resumption orders   Expected Discharge Plan: Home w Home Health Services Barriers to Discharge: Continued Medical Work up   Patient Goals and CMS Choice            Expected Discharge Plan and Services       Living arrangements for the past 2 months: Single Family Home                             HH Agency: [facility] Date [facility] Agency Contacted: 12/23/23   Representative spoke with at [facility] Agency: [place]   Prior Living Arrangements/Services Living arrangements for the past 2 months: Single Family Home Lives with:: Adult Children              Current home services: DME, Home PT    Activities of Daily Living   ADL Screening (condition at time of admission) Independently performs ADLs?: No Does the patient have a NEW difficulty with bathing/dressing/toileting/self-feeding that is expected to last >3 days?: Yes (Initiates electronic notice to provider for possible OT consult) Does the patient have a NEW difficulty with getting in/out of bed, walking, or climbing stairs that is expected to last >3 days?: Yes (Initiates electronic notice to provider for possible PT consult) Does the patient have a NEW difficulty with communication that is expected to last >3 days?: No Is the patient deaf or have difficulty hearing?: No Does the  patient have difficulty seeing, even when wearing glasses/contacts?: No Does the patient have difficulty concentrating, remembering, or making decisions?: No  Permission Sought/Granted                  Emotional Assessment              Admission diagnosis:  DKA (diabetic ketoacidosis) (HCC) [E11.10] Diabetic ketoacidosis without coma associated with type 2 diabetes mellitus (HCC) [E11.10] Stercoral colitis [K52.89] Patient Active Problem List   Diagnosis Date Noted   DKA (diabetic ketoacidosis) (HCC) 12/21/2023   Stercoral colitis 12/21/2023   Acute metabolic encephalopathy 12/21/2023   Acute urinary retention 12/21/2023   History of total knee arthroplasty, right 12/14/2023   History of total knee arthroplasty, left 07/20/2023   OSA (obstructive sleep apnea) 06/21/2023   Primary osteoarthritis of both knees 06/21/2023   Epigastric discomfort 04/20/2023   Productive cough 02/11/2023   Chronic sinusitis 02/11/2023   Dizziness 11/20/2022   Neuropathic pain of both feet 10/31/2021   GERD (gastroesophageal reflux disease) 04/25/2021   Word finding difficulty 02/21/2020   Arthralgia of both hands 11/29/2019   Seasonal allergic rhinitis 11/29/2019   Chronic knee pain (intermittent) (Right) 11/01/2018   Chronic pain syndrome 10/31/2018   History iodine allergy 09/28/2018    Class: History of   DDD (degenerative disc disease),  lumbar 09/27/2018   Grade 1 Anterolisthesis of L5/S1 09/16/2018   Lumbar facet arthropathy 09/16/2018   Arthropathy of lumbosacral facet joint 09/16/2018   Sialadenitis 08/24/2018   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 11/06/2017   Family history of breast cancer    Family history of colon cancer    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 08/12/2017   Opioid-induced constipation 04/13/2017   Chronic low back pain (Secondary area of Pain) (Bilateral) (R>L)  02/13/2016   Chronic lower extremity pain (Primary Area of Pain) (Right) 02/13/2016   Chronic lumbar radicular pain (Right) 02/13/2016   Type 2 diabetes mellitus with hyperglycemia, with long-term current use of insulin  (HCC) 07/18/2015   Vitamin D  deficiency 07/18/2015   Hyperlipidemia 12/13/2014   Asthma 12/13/2014   Hypothyroidism 12/13/2014   Lumbar herniated disc 12/13/2014   PCP:  Gretta Comer POUR, NP Pharmacy:   [facility] [phone] - [address], [place] - [address] [place] [address] Phone: [phone] Fax: [phone]  [facility] [place] (New Address) - [place], [place] - [address] AT Previously: Viviana Mulligan, [place] [address] 4th Floor [address] [place] [address] Phone: [phone] Fax: [phone]  [facility] - [facility] [address] [place] [address] Phone: [phone] Fax: [phone]     Social Drivers of Health (SDOH) Social History: SDOH Screenings   Food Insecurity: No Food Insecurity (12/21/2023)  Housing: Low Risk  (12/21/2023)  Transportation Needs: No Transportation Needs (12/21/2023)  Utilities: Not At Risk (12/21/2023)  Alcohol Screen: Low Risk  (10/29/2023)  Depression (PHQ2-9): Low Risk  (12/08/2023)  Financial Resource Strain: Low Risk  (12/08/2023)   [facility] from [facility]  Physical Activity: Insufficiently Active (12/07/2023)  Social Connections: Moderately Integrated (12/14/2023)  Stress: No Stress Concern Present (12/07/2023)  Tobacco Use: Low Risk  (12/21/2023)  Recent Concern: Tobacco Use - Medium Risk (12/08/2023)   Received from [facility]  Health Literacy: Adequate Health Literacy (10/29/2023)   SDOH Interventions:     Readmission Risk Interventions     No data to display

## 2023-12-23 NOTE — Progress Notes (Signed)
 Settings have been adjusted per Dr. Jess and Dr. Isaiah. Called pt and LVM to call back with any questions and to alert her that new settings have been sent to her machine.

## 2023-12-23 NOTE — Plan of Care (Addendum)
 IV's removed, discharge instructions reviewed, medication delivered to room, nutrition and diabetes education consult done, home health orders placed after the patient had been discharged so unable to discuss with family regarding that order.  Discharged to home with daughter.  Left message for case manager to let them know of home health orders.

## 2023-12-23 NOTE — Telephone Encounter (Signed)
-----   Message from [facility] Zea R sent at 12/21/2023  2:21 PM EDT ----- [place] she had a titration and was recommended to be placed on 4-20cmh20 EPR 3 per the study -- her current settings are 4-11 EPR 3. I can change them and then notify the patient, but I haven't  called her yet because I wasn't sure if her settings need to be changed or not.  ----- Message ----- From: Jess Devona BIRCH, MD Sent: 12/21/2023   2:03 PM EDT To: Exie JINNY Essex, CMA  Recommend CPAP titration study.   ----- Message ----- From: Vannie Donzell RAMAN Sent: 12/17/2023   8:20 AM EDT To: Yadir Zentner D Parris Cud[place], MD

## 2023-12-24 ENCOUNTER — Telehealth: Payer: Self-pay

## 2023-12-24 NOTE — Patient Instructions (Signed)
 Visit Information  Thank you for taking time to visit with me today. Please don't hesitate to contact me if I can be of assistance to you.  Patient education: schedule follow up visit with endocrinologist.  Per chart reivew patient referred for nutrition and diabetic education.  call MD for persistent N & V, dizziness or lightheadedness, extreme fatigue, difficulty breathing, headache or visual disturbance. schedule hospital follow up visit with primary care provider.  Seek emergency medical services for severe symptoms.   Patient verbalizes understanding of instructions and care plan provided today and agrees to view in MyChart. Active MyChart status and patient understanding of how to access instructions and care plan via MyChart confirmed with patient.     The patient has been provided with contact information for the care management team and has been advised to call with any health related questions or concerns.   Please call the care guide team at [phone] if you need to cancel or reschedule your appointment.   Please call the Suicide and Crisis Lifeline: 988 call the [place]  National Suicide Prevention Lifeline: 785-574-3548 or TTY: 832-800-6700 TTY 4048242315) to talk to a trained counselor call 1-800-273-TALK (toll free, 24 hour hotline) if you are experiencing a Mental Health or Behavioral Health Crisis or need someone to talk to.  Arvin Seip RN, BSN, CCM [employer], Population Health Case Manager Phone: [phone]

## 2023-12-24 NOTE — Transitions of Care (Post Inpatient/ED Visit) (Signed)
 12/24/2023  Name: Molly Wall MRN: [id number] DOB: 02/15/1946  Today's TOC FU Call Status: Today's TOC FU Call Status:: Successful TOC FU Call Completed TOC FU Call Complete Date: 12/24/23 Patient's Name and Date of Birth confirmed.  Transition Care Management Follow-up Telephone Call Date of Discharge: 12/23/23 Discharge Facility: [facility]) Type of Discharge: Inpatient Admission Primary Inpatient Discharge Diagnosis:: DKA How have you been since you were released from the hospital?: Better Any questions or concerns?: No  Items Reviewed: Did you receive and understand the discharge instructions provided?: Yes Medications obtained,verified, and reconciled?: Yes (Medications Reviewed) Any new allergies since your discharge?: No Dietary orders reviewed?: Yes Type of Diet Ordered:: diabetic diet Do you have support at home?: Yes People in Home [RPT]: child(ren), adult Name of Support/Comfort Primary Source: Morna Anon  Medications Reviewed Today: Medications Reviewed Today     Reviewed by Gaile Allmon E, RN (Registered Nurse) on 12/24/23 at 1543  Med List Status: <None>   Medication Order Taking? Sig Documenting Provider Last Dose Status Informant  acetaminophen  (TYLENOL ) 650 MG CR tablet [id number] Yes Take 1-2 tablets by mouth 3 (three) times daily as needed for pain. [provider]  Active Child  alendronate  (FOSAMAX ) 70 MG tablet [id number] Yes Take 1 tablet (70 mg total) by mouth once a week. Take with a full glass of water on an empty stomach. Dolphus Reiter, MD  Active Child  aspirin  EC 81 MG tablet [id number] Yes Take 81 mg by mouth 2 (two) times daily. Swallow whole. [provider]  Active Child  atorvastatin  (LIPITOR) 20 MG tablet [id number] Yes TAKE 1 TABLET BY MOUTH EVERY DAY FOR CHOLESTEROL Clark, Katherine K, NP  Active Child  bisacodyl  (DULCOLAX) 10 MG suppository [id number] Yes Unwrap and Place 1  suppository (10 mg total) rectally daily as needed for severe constipation. Lue Elsie BROCKS, MD  Active   bisacodyl  (DULCOLAX) 5 MG EC tablet [id number] Yes Take 1 tablet (5 mg total) by mouth daily as needed for moderate constipation. Cleaster Tinnie LABOR, PA-C  Active   celecoxib  (CELEBREX ) 200 MG capsule [id number] Yes Take 1 capsule (200 mg total) by mouth 2 (two) times daily. Drake Chew, PA-C  Active Child  ciprofloxacin (CIPRO) 500 MG tablet [id number] Yes Take 1 tablet (500 mg total) by mouth 2 (two) times daily for 4 days. Lue Elsie BROCKS, MD  Active   Glucagon  3 MG/DOSE POWD [id number] Yes Place 3 mg into the nose once as needed for up to 1 dose. Trixie File, MD  Active Child           Med Note [facility], [place] A   Mon Jul 20, 2023  1:42 PM) prn  insulin  lispro (HUMALOG  KWIKPEN) 100 UNIT/ML KwikPen [id number] Yes INJECT 25 UNITS INTO THE SKIN 3 (THREE) TIMES DAILY WITH MEALS Trixie File, MD  Active Child  levothyroxine  (SYNTHROID ) 88 MCG tablet [id number] Yes TAKE 1 AND 1/2 TABLET BY MOUTH EVERY SUNDAY. TAKE 1 TABLET BY MOUTH MON THROUGH SAT. TAKE ON AN EMPTY STOMACH WITH WATER ONLY. NO FOOD OR OTHER MEDICATIONS FOR 30 MINUTES. Gretta Comer POUR, NP  Active Child  metFORMIN  (GLUCOPHAGE -XR) 500 MG 24 hr tablet [id number] Yes TAKE 2 TABLETS (1,000 MG TOTAL) BY MOUTH DAILY WITH BREAKFAST. FOR DIABETES. Trixie File, MD  Active Child  NOVOLOG  100 UNIT/ML injection [id number] Yes INJECT UP TO 70 UNITS INTO THE SKIN ONCE FOR 1 DOSE. VIA PUMP Trixie File, MD  Active Child  polyethylene glycol  powder (GLYCOLAX/MIRALAX) 17 GM/SCOOP powder [id number] Yes Take 17 g by mouth 2 (two) times daily. Dissolve 1 capful (17g) in 4-8 ounces of liquid and take by mouth two times daily. Lue Elsie BROCKS, MD  Active   psyllium (METAMUCIL SMOOTH TEXTURE) 58.6 % powder [id number]  Take 1 packet by mouth 3 (three) times daily.  Patient not taking: Reported on 12/24/2023   Cleaster Tinnie LABOR, PA-C  Active   Semaglutide , 1 MG/DOSE, (OZEMPIC , 1 MG/DOSE,) 4 MG/3ML SOPN [id number] Yes INJECT 1 MG INTO THE SKIN ONE TIME PER WEEK Trixie File, MD  Active Child  senna-docusate (SENOKOT-S) 8.6-50 MG tablet [id number] Yes Take 1 tablet by mouth 2 (two) times daily for 7 days. Lue Elsie BROCKS, MD  Active   traMADol  (ULTRAM ) 50 MG tablet [id number]  Take 1-2 tablets (50-100 mg total) by mouth every 4 (four) hours as needed for moderate pain (pain score 4-6).  Patient not taking: Reported on 12/24/2023   Drake Chew, DEVONNA  Active Child            Home Care and Equipment/Supplies: Were Home Health Services Ordered?: Yes Name of Home Health Agency:: [facility] Has Agency set up a time to come to your home?: Yes First Home Health Visit Date:  (is scheduled for 12/25/23) Any new equipment or medical supplies ordered?: No  Functional Questionnaire: Do you need assistance with bathing/showering or dressing?: Yes Do you need assistance with meal preparation?: Yes Do you need assistance with eating?: No Do you have difficulty maintaining continence: No Do you need assistance with getting out of bed/getting out of a chair/moving?: No Do you have difficulty managing or taking your medications?: Yes (daughter assist with medications)  Follow up appointments reviewed: PCP Follow-up appointment confirmed?: No (Offered to assist wtih scheduling primary care provider hospital follow up visit. daughter states she will assist patient with scheduling.) Specialist Hospital Follow-up appointment confirmed?: Yes (advised patient to also schedule follow up with endocrinologist.) Date of Specialist follow-up appointment?: 12/29/23 Follow-Up Specialty Provider:: Dr. Mardee Do you need transportation to your follow-up appointment?: No Do you understand care options if your condition(s) worsen?: Yes-patient verbalized understanding  SDOH Interventions Today    Flowsheet Row Most  Recent Value  SDOH Interventions   Food Insecurity Interventions Intervention Not Indicated  Housing Interventions Intervention Not Indicated  Transportation Interventions Intervention Not Indicated  Utilities Interventions Intervention Not Indicated   Discussed and offered 30 day TOC program.  Patient declined.  The patient has been provided with contact information for the care management team and has been advised to call with any health -related questions or concerns.  The patient verbalized understanding with current plan of care.  The patient is directed to their insurance card regarding availability of benefits coverage.    Arvin Seip RN, BSN, CCM [employer], Population Health Case Manager Phone: [phone]

## 2023-12-26 LAB — CULTURE, BLOOD (ROUTINE X 2)
Culture: NO GROWTH
Culture: NO GROWTH
Special Requests: ADEQUATE
Special Requests: ADEQUATE

## 2023-12-27 ENCOUNTER — Other Ambulatory Visit: Payer: Medicare Other

## 2023-12-27 ENCOUNTER — Other Ambulatory Visit: Payer: Self-pay | Admitting: Primary Care

## 2023-12-27 DIAGNOSIS — J302 Other seasonal allergic rhinitis: Secondary | ICD-10-CM

## 2023-12-30 ENCOUNTER — Ambulatory Visit
Admission: RE | Admit: 2023-12-30 | Discharge: 2023-12-30 | Disposition: A | Discharge status: Home / Self Care | Source: Ambulatory Visit | Attending: Student | Admitting: Student

## 2023-12-30 ENCOUNTER — Other Ambulatory Visit: Payer: Self-pay | Admitting: Student

## 2023-12-30 DIAGNOSIS — R2241 Localized swelling, mass and lump, right lower limb: Secondary | ICD-10-CM | POA: Insufficient documentation

## 2024-01-01 ENCOUNTER — Ambulatory Visit: Payer: Self-pay

## 2024-01-01 NOTE — Telephone Encounter (Signed)
 Noted. Will await ED notes.

## 2024-01-01 NOTE — Telephone Encounter (Signed)
 FYI Only or Action Required?: FYI only for provider.  Patient was last seen in primary care on 12/09/2023 by Wendee Lynwood HERO, NP.  Called Nurse Triage reporting Hospitalization Follow-up.  Symptoms began yesterday.  Interventions attempted: Prescription medications: cream the hospital gave and Rest, hydration, or home remedies.  Symptoms are: gradually worsening.  Triage Disposition: Go to ED Now (or PCP Triage)  Patient/caregiver understands and will follow disposition?: Yes            Copied from CRM [id number]. Topic: Clinical - Red Word Triage >> Jan 01, 2024  8:31 AM Treva T wrote: [facility] that prompted transfer to Nurse Triage: Patient calling states she was seen in hospital, [facility] in [place], and was admitted due to some bowel issues, from 10/6-10/8/25.  Patient is now home, and is now experiencing bowel leakage and Is concerned.   Requesting to speak to nurse as soon as possible. Reason for Disposition  Sounds like a serious complication to the triager  Answer Assessment - Initial Assessment Questions Knee replacement and then after a few days---pt became very constipated--went to the ER & had to have manual evacuation of her bowels performed--stayed 3 days hospitalized with one day being in ICU--discharged 12/23/2023 Pt states in the past (30 years ago) she had a rectal abscess with bowel movement issues & doesn't want that to be happening again at this time After she was in the ER---pt had loose stools then yesterday patient noticed one spot that doesn't seem to be functioning correctly in her rectum and it's not closing down when she is done having a bowel movement Swelling/itching area but she states she feels like a gap---patient worried that something may be torn or a complication may be present Cream for rectal area given that helps some  Patient does not want to go the entire weekend without having this evaluated due to past complications involving this  area No appointments available today for PCP office Daughter with patient at this time She is going to go to an Emergency Room at this time by way of her daughter Patient is advised to call us  back if anything changes or with any further questions/concerns. Patient is advised that if anything worsens they can call 911 for an ambulance at any point. Patient verbalized understanding.  Protocols used: Post-Hospitalization Follow-up Call-A-AH

## 2024-01-04 ENCOUNTER — Ambulatory Visit (INDEPENDENT_AMBULATORY_CARE_PROVIDER_SITE_OTHER)
Admission: RE | Admit: 2024-01-04 | Discharge: 2024-01-04 | Disposition: A | Discharge status: Home / Self Care | Source: Ambulatory Visit | Attending: Family Medicine | Admitting: Family Medicine

## 2024-01-04 ENCOUNTER — Ambulatory Visit: Admitting: Family Medicine

## 2024-01-04 VITALS — BP 120/76 | HR 84 | Temp 98.2°F | Ht 65.0 in | Wt 246.5 lb

## 2024-01-04 DIAGNOSIS — Z794 Long term (current) use of insulin: Secondary | ICD-10-CM

## 2024-01-04 DIAGNOSIS — K5909 Other constipation: Secondary | ICD-10-CM

## 2024-01-04 DIAGNOSIS — Z96651 Presence of right artificial knee joint: Secondary | ICD-10-CM | POA: Diagnosis not present

## 2024-01-04 DIAGNOSIS — E1110 Type 2 diabetes mellitus with ketoacidosis without coma: Secondary | ICD-10-CM

## 2024-01-04 NOTE — Progress Notes (Signed)
 "    Molly Wall. Molly Salmon, MD, CAQ Sports Medicine [facility] [address] [place], [address]  Phone: [phone]  FAX: [phone]  Molly Wall - 78 y.o. 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 very pleasant female patient with Body mass index is 41.02 kg/m. who presents with the following:  Discussed the use of AI scribe software for clinical note transcription with the patient, who gave verbal consent to proceed.  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  She was recently hospitalized following knee replacement surgery due to severe constipation and diabetic ketoacidosis. During her hospital stay, she was admitted to the ICU and placed on an insulin  drip. The constipation was exacerbated by pain medications post-surgery, causing significant discomfort and urinary retention, leading to an embarrassing situation where she had to urinate on a pad due to the inability to void normally.  She has a history of occasional constipation, but nothing as severe as this recent  episode. A past colonoscopy showed no blockages, and she does not frequently experience constipation severe enough to require medical intervention. However, she has used an enema once or twice in the past few years. During her hospital stay, she experienced diarrhea following an enema.  Currently, she is experiencing nausea, fatigue, and a lack of appetite. Her blood sugar levels have been relatively stable since returning home, with readings around 205-210 mg/dL. She manages her diabetes with an insulin  pump and a Dexcom sensor, and has been on insulin  for about five to six years since being diagnosed with diabetes at age 49.   For her constipation, she was prescribed a stool softener (Dulcolax) for seven days and is currently taking Miralax  once daily. She reports minimal bowel movements since her recent bowel cleanout with Golightly, which resulted in clear liquid evacuation. She does not regularly take fiber supplements.   Review of Systems is noted in the HPI, as appropriate  Objective:   BP 120/76   Pulse 84   Temp 98.2 F (36.8 C) (Temporal)   Ht 5' 5 (1.651 m)   Wt 246 lb 8 oz (111.8 kg)   SpO2 94%   BMI 41.02 kg/m   GEN: No acute distress; alert,appropriate. CV: RRR, no m/g/r  ABD: S, NT, ND, + BS, No rebound, No HSM  PULM: Normal respiratory rate, no accessory muscle use. No wheezes, crackles or rhonchi  PSYCH: Normally interactive.   Laboratory and Imaging Data:  Assessment and Plan:     ICD-10-CM   1. Diabetic ketoacidosis without coma associated with type 2 diabetes mellitus (  HCC)  E11.10     2. Chronic constipation  K59.09 DG Abd 1 View    3. History of total knee arthroplasty, right  Z96.651      Assessment & Plan Diabetes mellitus with recent diabetic ketoacidosis Recent ICU admission for diabetic ketoacidosis. Currently using insulin  pump and Dexcom sensor. Blood glucose levels improved. Managed by endocrinology. - Continue management with endocrinology. -  Ensure access to insulin  and glucose monitoring supplies.  Chronic constipation with recent severe episode Severe constipation post-knee surgery and opioid use. Recent colonoscopy clear. Bowel prep effective but small bowel movements persist. Currently on Miralax . - Add Metamucil or Citrucel daily. - Continue Miralax  once daily. - Order abdominal x-ray. - Monitor bowel movements and adjust Miralax  dosage if needed.  Nausea and fatigue, under evaluation Nausea and fatigue post-bowel prep. Differential includes gastroparesis. Symptoms possibly linked to recent constipation and bowel prep.  Medication Management during today's office visit: No orders of the defined types were placed in this encounter.  Medications Discontinued During This Encounter  Medication Reason   traMADol  (ULTRAM ) 50 MG tablet Completed Course   psyllium (METAMUCIL SMOOTH TEXTURE) 58.6 % powder Completed Course    Orders placed today for conditions managed today: Orders Placed This Encounter  Procedures   DG Abd 1 View    Disposition: No follow-ups on file.  [employer] speech-to-text software was used for transcription in this dictation.  Possible transcriptional errors can occur using Animal nutritionist.   Signed,  Molly Wall. Molly Underberg, MD   Outpatient Encounter Medications as of 01/04/2024  Medication Sig   acetaminophen  (TYLENOL ) 650 MG CR tablet Take 1-2 tablets by mouth 3 (three) times daily as needed for pain.   alendronate  (FOSAMAX ) 70 MG tablet Take 1 tablet (70 mg total) by mouth once a week. Take with a full glass of water on an empty stomach.   aspirin  EC 81 MG tablet Take 81 mg by mouth 2 (two) times daily. Swallow whole.   atorvastatin  (LIPITOR) 20 MG tablet TAKE 1 TABLET BY MOUTH EVERY DAY FOR CHOLESTEROL   bisacodyl  (DULCOLAX) 10 MG suppository Unwrap and Place 1 suppository (10 mg total) rectally daily as needed for severe constipation.   bisacodyl  (DULCOLAX) 5 MG EC tablet Take 1  tablet (5 mg total) by mouth daily as needed for moderate constipation.   celecoxib  (CELEBREX ) 200 MG capsule Take 1 capsule (200 mg total) by mouth 2 (two) times daily.   fluticasone  (FLONASE ) 50 MCG/ACT nasal spray INSTILL 1 SPRAY IN EACH NOSTRIL TWICE A DAY AS NEEDED FOR ALLERGIES OR RHINITIS   Glucagon  3 MG/DOSE POWD Place 3 mg into the nose once as needed for up to 1 dose.   insulin  lispro (HUMALOG  KWIKPEN) 100 UNIT/ML KwikPen INJECT 25 UNITS INTO THE SKIN 3 (THREE) TIMES DAILY WITH MEALS   ipratropium (ATROVENT ) 0.03 % nasal spray Place 2 sprays into the nose 4 (four) times daily as needed.   levothyroxine  (SYNTHROID ) 88 MCG tablet TAKE 1 AND 1/2 TABLET BY MOUTH EVERY SUNDAY. TAKE 1 TABLET BY MOUTH MON THROUGH SAT. TAKE ON AN EMPTY STOMACH WITH WATER ONLY. NO FOOD OR OTHER MEDICATIONS FOR 30 MINUTES.   metFORMIN  (GLUCOPHAGE -XR) 500 MG 24 hr tablet TAKE 2 TABLETS (1,000 MG TOTAL) BY MOUTH DAILY WITH BREAKFAST. FOR DIABETES.   NOVOLOG  100 UNIT/ML injection INJECT UP TO 70 UNITS INTO THE SKIN ONCE FOR 1 DOSE. VIA PUMP   polyethylene glycol powder (GLYCOLAX /MIRALAX ) 17 GM/SCOOP powder Take 17 g by mouth 2 (  two) times daily. Dissolve 1 capful (17g) in 4-8 ounces of liquid and take by mouth two times daily.   Semaglutide , 1 MG/DOSE, (OZEMPIC , 1 MG/DOSE,) 4 MG/3ML SOPN INJECT 1 MG INTO THE SKIN ONE TIME PER WEEK   Vitamin D , Ergocalciferol , (DRISDOL ) 1.25 MG (50000 UNIT) CAPS capsule Take 50,000 Units by mouth every 7 (seven) days.   [DISCONTINUED] psyllium (METAMUCIL SMOOTH TEXTURE) 58.6 % powder Take 1 packet by mouth 3 (three) times daily. (Patient not taking: Reported on 12/24/2023)   [DISCONTINUED] traMADol  (ULTRAM ) 50 MG tablet Take 1-2 tablets (50-100 mg total) by mouth every 4 (four) hours as needed for moderate pain (pain score 4-6). (Patient not taking: Reported on 12/24/2023)   No facility-administered encounter medications on file as of 01/04/2024.   "

## 2024-01-04 NOTE — Patient Instructions (Signed)
 Add Metamucil or Citrucel daily

## 2024-01-05 ENCOUNTER — Encounter: Payer: Self-pay | Admitting: Family Medicine

## 2024-01-05 ENCOUNTER — Other Ambulatory Visit: Payer: Self-pay | Admitting: Nurse Practitioner

## 2024-01-05 DIAGNOSIS — S40861A Insect bite (nonvenomous) of right upper arm, initial encounter: Secondary | ICD-10-CM

## 2024-01-05 DIAGNOSIS — S80862A Insect bite (nonvenomous), left lower leg, initial encounter: Secondary | ICD-10-CM

## 2024-01-05 DIAGNOSIS — S40862A Insect bite (nonvenomous) of left upper arm, initial encounter: Secondary | ICD-10-CM

## 2024-01-06 ENCOUNTER — Ambulatory Visit: Payer: Self-pay | Admitting: Family Medicine

## 2024-01-06 ENCOUNTER — Ambulatory Visit: Payer: Self-pay

## 2024-01-06 DIAGNOSIS — K5909 Other constipation: Secondary | ICD-10-CM

## 2024-01-06 NOTE — Telephone Encounter (Signed)
 FYI Only or Action Required?: Action required by provider: clinical question for provider.  Patient was last seen in primary care on 01/04/2024 by Watt Mirza, MD.  Called Nurse Triage reporting Advice Only.  Symptoms began today.   Patient/caregiver understands and will follow disposition?: No, wishes to speak with PCP    Copied from CRM #[id number]. Topic: Clinical - Red Word Triage >> Jan 06, 2024  5:23 PM [place] J wrote: [facility] that prompted transfer to Nurse Triage: The patient is feeling terrible. She was told by Dr. Watt to increase her MiraLAX intake to help move stool around and has also tried a cleanse for colon prep.   She has already been in the ICU for this problem and was promised a call back but never received one.    Reason for Disposition  [1] Caller requesting NON-URGENT health information AND [2] PCP's office is the best resource  Answer Assessment - Initial Assessment Questions 1. REASON FOR CALL: What is the main reason for your call? or How can I best help you?     Daughter called to gather more information regarding pt's constipation and the increase in miralax.  Daughter is concerned this issue maybe related to diabetes and GI issues and wants to know if reglan  is a possibility or are there any other medications that may be given to assist.  Daughter is concerned because pt just had a bowel prep and also has been taking miralax  and is still backed up - daughter states she would think pt would not be this backed up and/or having this much discomfort at this point.  Daughter would like to further discuss these concerns, please call back with more clarification on plane of care moving forward.  Protocols used: Information Only Call - No Triage-A-AH

## 2024-01-07 NOTE — Telephone Encounter (Signed)
 Copied from CRM [id number]. Topic: Clinical - Lab/Test Results >> Jan 06, 2024  4:32 PM Rea ORN wrote: Reason for CRM: Pt calling to speak to Arland about results again. Please call back

## 2024-01-07 NOTE — Telephone Encounter (Signed)
 See result note.

## 2024-01-17 ENCOUNTER — Other Ambulatory Visit (INDEPENDENT_AMBULATORY_CARE_PROVIDER_SITE_OTHER): Payer: Self-pay | Admitting: Otolaryngology

## 2024-01-18 ENCOUNTER — Encounter: Payer: Self-pay | Admitting: Radiology

## 2024-01-18 ENCOUNTER — Ambulatory Visit: Payer: Self-pay

## 2024-01-18 NOTE — Telephone Encounter (Signed)
 FYI Only or Action Required?: Action required by provider: request for appointment.  Weds between 9:15 and 10:30. Follow up on GI referral and would like an x-ray ordered to see if pt is retaining stool. Pt is  nauseous and is having diarrhea - yet still feels like she is retaining stool. Daughter Manuelita would like a call back.   Patient was last seen in primary care on 01/04/2024 by Watt Mirza, MD.  Called Nurse Triage reporting Constipation.  Symptoms began about a month ago.  Interventions attempted: Other: see at several locations.  Symptoms are: unchanged.  Triage Disposition: No disposition on file.  Patient/caregiver understands and will follow disposition?:                             Summary: Constipation and diarrhea   Reason for Triage: Patient's daughter, Manuelita, called in stating that her mom had knee replacement surgery end of September and that is when some GI issues started: diarrhea and constipation. Patient is still dealing with that and would like to be seen by  gastroenterology specialist, and potentially have an x-ray done to show any blockages. Please call daughter Manuelita to discuss.       Reason for Disposition  Unable to have a bowel movement (BM) without laxative or enema  Answer Assessment - Initial Assessment Questions Call from daughter Manuelita. She reports that pt still has nausea and her bowels are not right. Right now she has diarrhea - but does not feels like she is getting all of the stool out. She is requesting an Xray to see if she is retaining stool, a follow up on the GI referral and possible change of medications. She would like an appt for her mother Weds between 9:15 10:30am . Please advise.      1. STOOL PATTERN OR FREQUENCY: How often do you have a bowel movement (BM)?  (Normal range: 3 times a day to every 3 days)  When was your last BM?       Pt is having diarrhea right now - but was quite constipated.   2. STRAINING: Do you have to strain to have a BM?      no 3. ONSET: When did the constipation begin?     After knee surgery 49. CHRONIC CONSTIPATION: Is this a new problem for you?  If No, ask: How long have you had this problem? (days, weeks, months)      Since knee surgery  9. MEDICINES: Have you been taking any new medicines? Are you taking any narcotic pain medicines? (e.g., Dilaudid , morphine, Percocet, Vicodin)     Was taking pain medication 10. LAXATIVES: Have you been using any stool softeners, laxatives, or enemas?  If Yes, ask What are you using, how often, and when was the last time?       Yes many  11. ACTIVITY:  How much walking do you do every day?  Has your activity level decreased in the past week?        decreased 12. CAUSE: What do you think is causing the constipation?        Pain medication not moving a lot  Protocols used: Constipation-A-AH

## 2024-01-19 ENCOUNTER — Ambulatory Visit: Payer: Self-pay

## 2024-01-19 NOTE — Telephone Encounter (Signed)
 Spoke with pt/pt's daughter, Manuelita (on dpr), to schedule OV with Mallie. However, says pt is already scheduled tomorrow at 3:00. Noting else needed at this time.

## 2024-01-19 NOTE — Telephone Encounter (Signed)
 Noted, will evaluate.

## 2024-01-19 NOTE — Telephone Encounter (Signed)
 Copied from CRM #[id number]. Topic: Clinical - Pink Word Triage >> Jan 18, 2024  2:40 PM Alexandria E wrote: Pink Word triggered transfer to Nurse Triage. See Triage Message for details. >> Jan 19, 2024  8:17 AM Ivette P wrote: PT stated she was told to call in to check for appt. Requested to speak to nurse, next available is 11/12 >> Jan 18, 2024  2:44 PM Alexandria E wrote: Reason for Triage: Patient's daughter, Manuelita, called in stating that her mom had knee replacement surgery end of September and that is when some GI issues started: diarrhea and constipation. Patient is still dealing with that and would like to be seen by  gastroenterology specialist, and potentially have an x-ray done to show any blockages. Please call daughter Manuelita to discuss. Reason for Disposition . Requesting regular office appointment  Answer Assessment - Initial Assessment Questions 1. REASON FOR CALL: What is the main reason for your call? or How can I best help you?     Spoke with triage nurse on yesterday and was told that if she was not given a call back, to call first thing in the morning.   2. SYMPTOMS : Do you have any symptoms?      Unchanged   3. OTHER QUESTIONS: Do you have any other questions?     Did the nurse talk to Comer about the issues my mom was having? Did the nurse talk to her about the urgency to get her an appointment today or mid morning tomorrow?  Protocols used: Information Only Call - No Triage-A-AH

## 2024-01-20 ENCOUNTER — Encounter: Payer: Self-pay | Admitting: Primary Care

## 2024-01-20 ENCOUNTER — Ambulatory Visit (INDEPENDENT_AMBULATORY_CARE_PROVIDER_SITE_OTHER)
Admission: RE | Admit: 2024-01-20 | Discharge: 2024-01-20 | Disposition: A | Discharge status: Home / Self Care | Source: Ambulatory Visit | Attending: Primary Care | Admitting: Primary Care

## 2024-01-20 ENCOUNTER — Ambulatory Visit (INDEPENDENT_AMBULATORY_CARE_PROVIDER_SITE_OTHER): Admitting: Primary Care

## 2024-01-20 ENCOUNTER — Ambulatory Visit: Payer: Self-pay | Admitting: Primary Care

## 2024-01-20 VITALS — BP 118/66 | HR 87 | Temp 98.1°F | Ht 65.0 in | Wt 240.4 lb

## 2024-01-20 DIAGNOSIS — K5909 Other constipation: Secondary | ICD-10-CM

## 2024-01-20 DIAGNOSIS — E039 Hypothyroidism, unspecified: Secondary | ICD-10-CM | POA: Diagnosis not present

## 2024-01-20 MED ORDER — LINACLOTIDE 72 MCG PO CAPS: 72.0000 ug | ORAL_CAPSULE | Freq: Every day | ORAL | 0 refills | Status: DC

## 2024-01-20 NOTE — Patient Instructions (Signed)
 Stop by the lab and xray prior to leaving today. I will notify you of your results once received.   Start Linzess for constipation.  Take 1 capsule by mouth every day to every other day.  You will receive a phone call regarding the referral to GI.  It was a pleasure to see you today!

## 2024-01-20 NOTE — Assessment & Plan Note (Signed)
 With multiple ED visits and hospitalization. ED hospital notes, labs, imaging reviewed  Will repeat abdominal plain films today Urgent referral placed to GI  Repeat TSH pending today.   Given limited improvement will try Linzess 72 mcg daily to every other day.  Titrate upward as needed. Continue good water consumption.

## 2024-01-20 NOTE — Assessment & Plan Note (Signed)
 Continue levothyroxine  88 mcg daily. Commended her on daily compliance.  It is possible that uncontrolled hypothyroidism could be contributing to her constipation.  Repeat TSH pending

## 2024-01-20 NOTE — Progress Notes (Signed)
 Subjective:    Patient ID: Molly Wall, female    DOB: December 14, 1945, 78 y.o.   MRN: [id number]  Molly Wall is a very pleasant 78 y.o. female with a history of opioid-induced constipation, GERD, colitis, type 2 diabetes, hypothyroidism, chronic back pain who presents today to discuss constipation/diarrhea.  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  Her constipation was suspected to be secondary to opioids postoperatively.  She underwent disimpaction in the ED with moderate stool return.  She was treated with multiple bowel movement agents with improvement.   She presented to [facility] ED on 01/01/2024 for incontinence of feces and constipation.  Upon evaluation she was noted to have external hemorrhoids that were nonthrombosed, not on inflamed.  There is also a potential anal fissure without infection.  She underwent KUB x-ray which showed a large amount of stool within the colon.  She was provided with a prescription for GoLytely bowel cleanout and advised to take MiraLAX.  Evaluated by Dr. Watt on 01/04/2024 for hospital follow-up on constipation.  During this visit she discussed minimal bowel movements since bowel cleanout with GoLytely which resulted in clear liquid evacuation.  She was advised to add Metamucil or Citrucel daily, continue MiraLAX daily.  She underwent abdominal plain films which revealed moderate colonic stool burden throughout the colon.  Today she continues to experience constipation. She is nauseated, bloated, gaseous often, small amounts of stool that is sudden. She stopped narcotics a few weeks ago. She does not see GI, a referral was placed on 01/07/24 and she has not heard anything back. Her last TSH was 76 in September 2025, she is now more compliant with her levothyroxine .     Review of Systems   Constitutional:  Negative for fever.  Gastrointestinal:  Positive for constipation and nausea.       Abdominal bloating  Allergic/Immunologic: Negative for environmental allergies.         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   Breast cancer (HCC)    Diabetes mellitus without complication (HCC)    Family history of breast cancer    Family history of colon cancer    Hypercholesteremia    Hypothyroidism    Lumbar herniated disc    4-5th lumbar   OSA (obstructive sleep apnea)    used to use CPAP, not any more over 1 1/2 yrs   Sleep apnea    no cpap in 2 yrs    Vitamin D  deficiency     Social History   Socioeconomic History   Marital status: Divorced    Spouse name: Not on file   Number of children: 2   Years of education: Not on file   Highest education level: Master's degree (e.g., MA, MS, MEng, MEd, MSW, MBA)  Occupational History   Not on file  Tobacco Use   Smoking status: Never    Passive exposure: Never   Smokeless tobacco: Never  Vaping Use   Vaping status: Never Used  Substance and Sexual Activity   Alcohol use: Not Currently   Drug use: No   Sexual activity: Not Currently  Other Topics Concern   Not on file  Social History Narrative   Single.   Moved from [place]  to  [place] several weeks ago.   Family lives in [place].   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   Social Drivers of Corporate Investment Banker Strain: Low Risk  (01/04/2024)   Received [facility]   Overall Financial Resource Strain (CARDIA)    Difficulty of Paying Living Expenses: Not hard at all  Food Insecurity: No Food Insecurity (01/04/2024)   Received from [facility]   Hunger Vital Sign    Within the past 12 months, you worried that your food would run  out before you got the money to buy more.: Never true    Within the past 12 months, the food you bought just didn't last and you didn't have money to get more.: Never true  Transportation Needs: No Transportation Needs (01/04/2024)   Received from [facility] - Transportation    In the past 12 months, has lack of transportation kept you from medical appointments or from getting medications?: No    Lack of Transportation (Non-Medical): No  Physical Activity: Insufficiently Active (12/07/2023)   Exercise Vital Sign    Days of Exercise per Week: 3 days    Minutes of Exercise per Session: 30 min  Stress: No Stress Concern Present (12/07/2023)   [employer] of Occupational Health - Occupational Stress Questionnaire    Feeling of Stress: Not at all  Social Connections: Moderately Integrated (12/14/2023)   Social Connection and Isolation Panel    Frequency of Communication with Friends and Family: More than three times a week    Frequency of Social Gatherings with Friends and Family: Twice a week    Attends Religious Services: More than 4 times per year    Active Member of [employer] or Organizations: Yes    Attends Engineer, Structural: More than 4 times per year    Marital Status: Divorced  Intimate Partner Violence: Not At Risk (12/24/2023)   Humiliation, Afraid, Rape, and Kick questionnaire    Fear of Current or Ex-Partner: No    Emotionally Abused: No    Physically Abused: No    Sexually Abused: No    Past Surgical History:  Procedure Laterality Date   APPENDECTOMY     BREAST BIOPSY Left 07/10/2023   MM LT BREAST BX W LOC DEV 1ST LESION IMAGE BX SPEC STEREO GUIDE 07/10/2023 [facility]   BREAST LUMPECTOMY Right    per pt - precancerous   BREAST LUMPECTOMY WITH RADIOACTIVE SEED LOCALIZATION Right 08/12/2017   Procedure: RIGHT BREAST LUMPECTOMY X'S 2 WITH RADIOACTIVE SEED LOCALIZATION X'S 2;  Surgeon: Gail Favorite, MD;  Location: [facility];  Service: General;  Laterality: Right;   COLONOS[place]     [place]  - polyps but given a 10 yr recall per pt    GANGLION CYST EXCISION     78 years old   INCISION AND DRAINAGE ABSCESS ANAL     KNEE ARTHROPLASTY Left 07/20/2023   Procedure: ARTHROPLASTY, KNEE, TOTAL, USING IMAGELESS COMPUTER-ASSISTED NAVIGATION;  Surgeon: Mardee Lynwood SQUIBB, MD;  Location: [facility];  Service: Orthopedics;  Laterality: Left;   KNEE ARTHROPLASTY Right 12/14/2023   Procedure: ARTHROPLASTY, KNEE, TOTAL, USING IMAGELESS COMPUTER-ASSISTED NAVIGATION;  Surgeon: Mardee Lynwood SQUIBB, MD;  Location: [facility];  Service: Orthopedics;  Laterality: Right;   KNEE SURGERY  2014   SHOULDER SURGERY  1974   TONSILLECTOMY  age 32    Family History  Problem Relation Age of Onset   Alzheimer's disease Mother    Asthma Mother    Dementia Mother        cause of death   Arthritis Father    Heart attack Father 34       cause of death   Breast cancer Sister 1   Healthy Sister    Breast cancer Daughter 77   Healthy Daughter    Healthy Daughter    Liver cancer Maternal Uncle    Colon cancer Paternal Grandmother    Healthy Brother    Colon polyps Neg Hx    Rectal cancer Neg Hx    Stomach cancer Neg Hx    Esophageal cancer Neg Hx     Allergies  Allergen Reactions   Iodine-131 Anaphylaxis    Pt states when she was 21=she drank an experimental Iodinated Radioisotope for her thyroid . She described it was a small amount in a milk carton in [place] . She states they stopped using it shortly after because the trial wasn't successful. SPM   Prednisone      Make sugar jump way up.    Current Outpatient Medications on File Prior to Visit  Medication Sig Dispense Refill   acetaminophen  (TYLENOL ) 650 MG CR tablet Take 1-2 tablets by mouth 3 (three) times daily as needed for pain.     alendronate  (FOSAMAX ) 70 MG tablet Take 1 tablet (70 mg total) by mouth once a week. Take with a full glass of water on  an empty stomach. 4 tablet 2   aspirin  EC 81 MG tablet Take 81 mg by mouth 2 (two) times daily. Swallow whole.     atorvastatin  (LIPITOR) 20 MG tablet TAKE 1 TABLET BY MOUTH EVERY DAY FOR CHOLESTEROL 90 tablet 0   bisacodyl  (DULCOLAX) 10 MG suppository Unwrap and Place 1 suppository (10 mg total) rectally daily as needed for severe constipation. 12 suppository 0   bisacodyl  (DULCOLAX) 5 MG EC tablet Take 1 tablet (5 mg total) by mouth daily as needed for moderate constipation. 30 tablet 1   celecoxib  (CELEBREX ) 200 MG capsule Take 1 capsule (200 mg total) by mouth 2 (two) times daily. 60 capsule 1   fluticasone  (FLONASE ) 50 MCG/ACT nasal spray INSTILL 1 SPRAY IN EACH NOSTRIL TWICE A DAY AS NEEDED FOR ALLERGIES OR RHINITIS 48 mL 0   Glucagon  3 MG/DOSE POWD Place 3 mg into the nose once as needed for up to 1 dose. 1 each 11   insulin  lispro (HUMALOG  KWIKPEN) 100 UNIT/ML KwikPen INJECT 25 UNITS INTO THE SKIN 3 (THREE) TIMES DAILY WITH MEALS 15 mL PRN   ipratropium (ATROVENT ) 0.03 % nasal spray Place 2 sprays into the nose 4 (four) times daily as needed.     levothyroxine  (SYNTHROID ) 88 MCG tablet TAKE 1 AND 1/2 TABLET BY MOUTH EVERY SUNDAY. TAKE 1 TABLET BY MOUTH MON THROUGH SAT. TAKE ON AN EMPTY STOMACH WITH WATER ONLY. NO FOOD OR OTHER MEDICATIONS FOR 30 MINUTES. 96 tablet 0   metFORMIN  (GLUCOPHAGE -XR) 500 MG 24 hr tablet TAKE 2 TABLETS (1,000 MG TOTAL) BY MOUTH DAILY WITH BREAKFAST. FOR DIABETES. 180 tablet 1   NOVOLOG  100 UNIT/ML injection INJECT UP TO 70 UNITS INTO THE SKIN ONCE FOR 1 DOSE. VIA PUMP 70 mL 5   polyethylene glycol powder (GLYCOLAX/MIRALAX) 17 GM/SCOOP powder Take 17 g by mouth 2 (two) times daily. Dissolve 1 capful (17g) in 4-8 ounces of liquid and take by mouth two  times daily. 238 g 0   Semaglutide , 1 MG/DOSE, (OZEMPIC , 1 MG/DOSE,) 4 MG/3ML SOPN INJECT 1 MG INTO THE SKIN ONE TIME PER WEEK 3 mL 11   Vitamin D , Ergocalciferol , (DRISDOL ) 1.25 MG (50000 UNIT) CAPS capsule Take 50,000  Units by mouth every 7 (seven) days.     No current facility-administered medications on file prior to visit.    BP 118/66   Pulse 87   Temp 98.1 F (36.7 C) (Oral)   Ht 5' 5 (1.651 m)   Wt 240 lb 6 oz (109 kg)   SpO2 95%   BMI 40.00 kg/m  Objective:   Physical Exam Cardiovascular:     Rate and Rhythm: Normal rate and regular rhythm.  Pulmonary:     Effort: Pulmonary effort is normal.     Breath sounds: Normal breath sounds.  Abdominal:     General: Bowel sounds are normal.     Palpations: Abdomen is soft.     Tenderness: There is no abdominal tenderness.  Musculoskeletal:     Cervical back: Neck supple.  Skin:    General: Skin is warm and dry.  Neurological:     Mental Status: She is alert and oriented to person, place, and time.  Psychiatric:        Mood and Affect: Mood normal.     Physical Exam        Assessment & Plan:  Chronic constipation Assessment & Plan: With multiple ED visits and hospitalization. ED hospital notes, labs, imaging reviewed  Will repeat abdominal plain films today Urgent referral placed to GI  Repeat TSH pending today.   Given limited improvement will try Linzess 72 mcg daily to every other day.  Titrate upward as needed. Continue good water consumption.  Orders: -     linaCLOtide; Take 1 capsule (72 mcg total) by mouth daily before breakfast. For constipation  Dispense: 30 capsule; Refill: 0 -     DG Abd 2 Views -     Ambulatory referral to Gastroenterology  Hypothyroidism, unspecified type Assessment & Plan: Continue levothyroxine  88 mcg daily. Commended her on daily compliance.  It is possible that uncontrolled hypothyroidism could be contributing to her constipation.  Repeat TSH pending  Orders: -     TSH    Assessment and Plan Assessment & Plan         Comer MARLA Gaskins, NP    Discussed the use of AI scribe software for clinical note transcription with the patient, who gave verbal consent to  proceed.  History of Present Illness

## 2024-01-21 LAB — TSH: TSH: 5.29 u[IU]/mL (ref 0.35–5.50)

## 2024-01-22 ENCOUNTER — Ambulatory Visit (INDEPENDENT_AMBULATORY_CARE_PROVIDER_SITE_OTHER): Admitting: Gastroenterology

## 2024-01-22 ENCOUNTER — Encounter: Payer: Self-pay | Admitting: Gastroenterology

## 2024-01-22 VITALS — BP 118/68 | HR 80 | Ht 64.0 in | Wt 239.1 lb

## 2024-01-22 DIAGNOSIS — R1084 Generalized abdominal pain: Secondary | ICD-10-CM | POA: Diagnosis not present

## 2024-01-22 DIAGNOSIS — R140 Abdominal distension (gaseous): Secondary | ICD-10-CM | POA: Diagnosis not present

## 2024-01-22 DIAGNOSIS — R933 Abnormal findings on diagnostic imaging of other parts of digestive tract: Secondary | ICD-10-CM

## 2024-01-22 DIAGNOSIS — K5900 Constipation, unspecified: Secondary | ICD-10-CM

## 2024-01-22 NOTE — Progress Notes (Signed)
 [facility] Consult Note:  History: Molly Wall 01/22/2024  Referring provider: Gretta Comer POUR, NP  Reason for consult/chief complaint: Constipation (Pt hasn't had any bm today ) and Diarrhea (Always shave to go in a n urgency always after she eats pt gets diarrhea )   Subjective  Prior history:  Diminutive tubular adenoma 2019 (and has been seen in clinic February 2019 for constipation) Surveillance colonoscopy October 2024: No polyps, there was left-sided diverticulosis as well as internal hemorrhoids   Discussed the use of AI scribe software for clinical note transcription with the patient, who gave verbal consent to proceed.  History of Present Illness  My chart review: She underwent knee replacement 12/14/2023 Admitted to [facility] 12/21/2023 with severe constipation and escalating abdominal pain with urinary retention as well as severe hyperglycemia.  Foley catheter placed, bowel regimen given with reported relief of the constipation and patient been off postop opioids.  CT abdomen and pelvis reportedly showed question of colitis, which was treated with antibiotics.  She then went to an ED in [place], [place] for anorectal symptoms, provider note includes the following: This is a 78 year old female with history significant for recent hospital admission with DKA, stercoral colitis, need for bowel disimpaction manually on multiple attempts as well as history of perirectal abscess many years ago.  On exam patient appears otherwise well. With chaperone present patient had rectal exam performed to evaluate for possible anal fissure. There were 3 small external hemorrhoids that were noninflamed, nonthrombosed, and no overlying erythema. There is no evidence of any infection around patient's rectum and no notable defect within the anal sphincter. Patient is likely feeling the defect as the area of her anal sphincter that does not have external hemorrhoids  feeling as if there is a gap there. This was explained to the patient and she felt this explained what she was feeling.  Since patient is still having leakage of stool and diarrhea with no significant formed stool we will perform a KUB to identify whether she still has a rectal stool burden that could be causing overflow diarrhea.  ED Course as of 01/01/24 1242  Fri Jan 01, 2024  1241 Patient's x-ray showed a large amount of stool within patient's colon. Will provide a GoLytely bowel cleanout advised over-the-counter MiraLAX after the cleanout was performed.    She saw primary care 01/20/2024, and then entire provider note was reviewed.  Patient had reportedly not been consistently compliant with her Synthroid .  PCP prescribed Linzess.   KUB that day (see below) showed moderate colonic stool _________________   ____________________________ Molly Wall is a 78 year old female who presents with persistent diarrhea and gastrointestinal discomfort.  She has been experiencing persistent diarrhea since her knee replacement surgery. The diarrhea is described as consisting of 'thousands of individual little clustered things' and 'pellet diarrhea'. It is sudden, requiring her to rush to the bathroom, and occurs daily. She also experiences nausea and a lack of appetite, leading her to skip meals, particularly dinner, due to feeling like she might be sick if she eats.  She has a history of constipation and was hospitalized for severe constipation and abdominal pain following her knee replacement. During her hospital stay, she underwent a bowel regimen and a CT scan, which showed some inflammation of the rectal wall due to stool impaction. She was treated with a gallon flush and antibiotics. Despite these interventions, she continues to experience gastrointestinal symptoms.  Her current medications include metformin   and Ozempic , which she resumed about two weeks after her September knee  surgery. She had been off Ozempic  due to a shortage and resumed it post-surgery. She has also been using Miralax and a Chinese dieters tea, which she stopped two days ago to slow down her bowel movements. She is awaiting a prescription for Linzess, which has not yet been filled.  Her thyroid  function was significantly underactive with a TSH of 42 in early September, which has since normalized. She acknowledges being lax with her thyroid  medication, which may have contributed to her bowel issues. She has a history of two knee replacements, one in May and another in September, and has been recovering from these surgeries.     She saw primary care 01/20/2024, and then entire provider note was reviewed.  Patient had reportedly not been consistently compliant with her Synthroid .  PCP prescribed Linzess.   KUB that day (see below) showed moderate colonic stool _________________  (10 minutes late to today's visit) ROS:  Review of Systems  Constitutional:  Negative for appetite change and unexpected weight change.  HENT:  Negative for mouth sores and voice change.   Eyes:  Negative for pain and redness.  Respiratory:  Negative for cough and shortness of breath.   Cardiovascular:  Negative for chest pain and palpitations.  Genitourinary:  Negative for dysuria and hematuria.  Musculoskeletal:  Positive for arthralgias. Negative for myalgias.  Skin:  Negative for pallor and rash.  Neurological:  Negative for weakness and headaches.  Hematological:  Negative for adenopathy.     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   Breast cancer (HCC)    Diabetes mellitus without complication (HCC)    Family history of breast cancer    Family history of colon cancer    Hypercholesteremia    Hypothyroidism    Lumbar herniated disc    4-5th lumbar   OSA (obstructive sleep apnea)     used to use CPAP, not any more over 1 1/2 yrs   Sleep apnea    no cpap in 2 yrs    Vitamin D  deficiency      Past Surgical History: Past Surgical History:  Procedure Laterality Date   APPENDECTOMY     BREAST BIOPSY Left 07/10/2023   MM LT BREAST BX W LOC DEV 1ST LESION IMAGE BX SPEC STEREO GUIDE 07/10/2023 [facility]   BREAST LUMPECTOMY Right    per pt - precancerous   BREAST LUMPECTOMY WITH RADIOACTIVE SEED LOCALIZATION Right 08/12/2017   Procedure: RIGHT BREAST LUMPECTOMY X'S 2 WITH RADIOACTIVE SEED LOCALIZATION X'S 2;  Surgeon: Gail Favorite, MD;  Location: [facility];  Service: General;  Laterality: Right;   [place] but given a 10 yr recall per pt    GANGLION CYST EXCISION     78 years old   INCISION AND DRAINAGE ABSCESS ANAL     KNEE ARTHROPLASTY Left 07/20/2023   Procedure: ARTHROPLASTY, KNEE, TOTAL, USING IMAGELESS COMPUTER-ASSISTED NAVIGATION;  Surgeon: Mardee Lynwood SQUIBB, MD;  Location: [facility];  Service: Orthopedics;  Laterality: Left;   KNEE ARTHROPLASTY Right 12/14/2023   Procedure: ARTHROPLASTY, KNEE, TOTAL, USING IMAGELESS COMPUTER-ASSISTED NAVIGATION;  Surgeon: Mardee Lynwood SQUIBB, MD;  Location: [facility];  Service: Orthopedics;  Laterality: Right;   KNEE SURGERY  2014  SHOULDER SURGERY  54   TONSILLECTOMY     age 37     Family History: Family History  Problem Relation Age of Onset   Alzheimer's disease Mother    Asthma Mother    Dementia Mother        cause of death   Arthritis Father    Heart attack Father 63       cause of death   Breast cancer Sister 5   Healthy Sister    Breast cancer Daughter 64   Healthy Daughter    Healthy Daughter    Liver cancer Maternal Uncle    Colon cancer Paternal Grandmother    Healthy Brother    Colon polyps Neg Hx    Rectal cancer Neg Hx    Stomach cancer Neg Hx    Esophageal cancer Neg Hx     Social History: Social History   Socioeconomic History    Marital status: Divorced    Spouse name: Not on file   Number of children: 2   Years of education: Not on file   Highest education level: Master's degree (e.g., MA, MS, MEng, MEd, MSW, MBA)  Occupational History   Not on file  Tobacco Use   Smoking status: Never    Passive exposure: Never   Smokeless tobacco: Never  Vaping Use   Vaping status: Never Used  Substance and Sexual Activity   Alcohol use: Not Currently   Drug use: No   Sexual activity: Not Currently  Other Topics Concern   Not on file  Social History Narrative   Single.   Moved from [place]  to [place] several weeks ago.   Family lives in [place].   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   Social Drivers of Corporate Investment Banker Strain: Low Risk  (01/04/2024)   Received from [facility]   Overall Financial Resource Strain (CARDIA)    Difficulty of Paying Living Expenses: Not hard at all  Food Insecurity: No Food Insecurity (01/04/2024)   Received from [facility]   Hunger Vital Sign    Within the past 12 months, you worried that your food would run out before you got the money to buy more.: Never true    Within the past 12 months, the food you bought just didn't last and you didn't have money to get more.: Never true  Transportation Needs: No Transportation Needs (01/04/2024)   Received from [facility] - Transportation    In the past 12 months, has lack of transportation kept you from medical appointments or from getting medications?: No    Lack of Transportation (Non-Medical): No  Physical Activity: Insufficiently Active (12/07/2023)   Exercise Vital Sign    Days of Exercise per Week: 3 days    Minutes of Exercise per Session: 30 min  Stress: No Stress Concern Present (12/07/2023)   [employer] of Occupational Health - Occupational Stress Questionnaire     Feeling of Stress: Not at all  Social Connections: Moderately Integrated (12/14/2023)   Social Connection and Isolation Panel    Frequency of Communication with Friends and Family: More than three times a week    Frequency of Social Gatherings with Friends and Family: Twice a week    Attends Religious Services: More than 4 times per year    Active Member of [employer] or Organizations: Yes  Attends Banker Meetings: More than 4 times per year    Marital Status: Divorced    Allergies: Allergies  Allergen Reactions   Iodine-131 Anaphylaxis    Pt states when she was 21=she drank an experimental Iodinated Radioisotope for her thyroid . She described it was a small amount in a milk carton in [place] . She states they stopped using it shortly after because the trial wasn't successful. SPM   Prednisone      Make sugar jump way up.    Outpatient Meds: Current Outpatient Medications  Medication Sig Dispense Refill   acetaminophen  (TYLENOL ) 650 MG CR tablet Take 1-2 tablets by mouth 3 (three) times daily as needed for pain.     alendronate  (FOSAMAX ) 70 MG tablet Take 1 tablet (70 mg total) by mouth once a week. Take with a full glass of water on an empty stomach. 4 tablet 2   aspirin  EC 81 MG tablet Take 81 mg by mouth 2 (two) times daily. Swallow whole.     atorvastatin  (LIPITOR) 20 MG tablet TAKE 1 TABLET BY MOUTH EVERY DAY FOR CHOLESTEROL 90 tablet 0   bisacodyl  (DULCOLAX) 10 MG suppository Unwrap and Place 1 suppository (10 mg total) rectally daily as needed for severe constipation. 12 suppository 0   bisacodyl  (DULCOLAX) 5 MG EC tablet Take 1 tablet (5 mg total) by mouth daily as needed for moderate constipation. 30 tablet 1   celecoxib  (CELEBREX ) 200 MG capsule Take 1 capsule (200 mg total) by mouth 2 (two) times daily. 60 capsule 1   fluticasone  (FLONASE ) 50 MCG/ACT nasal spray INSTILL 1 SPRAY IN EACH NOSTRIL TWICE A DAY AS NEEDED FOR ALLERGIES OR RHINITIS 48 mL 0   Glucagon   3 MG/DOSE POWD Place 3 mg into the nose once as needed for up to 1 dose. 1 each 11   Insulin  Disposable Pump (OMNIPOD 5 [id number] PODS GEN 5) MISC Inject 1 each into the skin.     insulin  lispro (HUMALOG  KWIKPEN) 100 UNIT/ML KwikPen INJECT 25 UNITS INTO THE SKIN 3 (THREE) TIMES DAILY WITH MEALS 15 mL PRN   ipratropium (ATROVENT ) 0.03 % nasal spray Place 2 sprays into the nose 4 (four) times daily as needed.     levothyroxine  (SYNTHROID ) 88 MCG tablet TAKE 1 AND 1/2 TABLET BY MOUTH EVERY SUNDAY. TAKE 1 TABLET BY MOUTH MON THROUGH SAT. TAKE ON AN EMPTY STOMACH WITH WATER ONLY. NO FOOD OR OTHER MEDICATIONS FOR 30 MINUTES. 96 tablet 0   linaclotide (LINZESS) 72 MCG capsule Take 1 capsule (72 mcg total) by mouth daily before breakfast. For constipation 30 capsule 0   metFORMIN  (GLUCOPHAGE -XR) 500 MG 24 hr tablet TAKE 2 TABLETS (1,000 MG TOTAL) BY MOUTH DAILY WITH BREAKFAST. FOR DIABETES. 180 tablet 1   NOVOLOG  100 UNIT/ML injection INJECT UP TO 70 UNITS INTO THE SKIN ONCE FOR 1 DOSE. VIA PUMP 70 mL 5   polyethylene glycol powder (GLYCOLAX/MIRALAX) 17 GM/SCOOP powder Take 17 g by mouth 2 (two) times daily. Dissolve 1 capful (17g) in 4-8 ounces of liquid and take by mouth two times daily. 238 g 0   Semaglutide , 1 MG/DOSE, (OZEMPIC , 1 MG/DOSE,) 4 MG/3ML SOPN INJECT 1 MG INTO THE SKIN ONE TIME PER WEEK 3 mL 11   Vitamin D , Ergocalciferol , (DRISDOL ) 1.25 MG (50000 UNIT) CAPS capsule Take 50,000 Units by mouth every 7 (seven) days.     No current facility-administered medications for this visit.      ___________________________________________________________________ Objective   Exam:  BP 118/68  Pulse 80   Ht 5' 4 (1.626 m)   Wt 239 lb 2 oz (108.5 kg)   BMI 41.05 kg/m  Wt Readings from Last 3 Encounters:  01/22/24 239 lb 2 oz (108.5 kg)  01/20/24 240 lb 6 oz (109 kg)  01/04/24 246 lb 8 oz (111.8 kg)   Daughter present for entire visit General: Well-appearing, antalgic gait.  Gets on exam  table with a little assistance Eyes: sclera anicteric, no redness ENT: oral mucosa moist without lesions, no cervical or supraclavicular lymphadenopathy CV: Regular without appreciable murmur, no JVD, no peripheral edema Resp: clear to auscultation bilaterally, normal RR and effort noted GI: soft, no tenderness, with active bowel sounds. No guarding or palpable organomegaly noted. Skin; warm and dry, no rash or jaundice noted Neuro: awake, alert and oriented x 3. Normal gross motor function and fluent speech   Labs:     Latest Ref Rng & Units 12/23/2023    3:51 AM 12/22/2023    7:17 AM 12/21/2023    8:54 PM  CBC  WBC 4.0 - 10.5 K/uL 11.1  13.0  15.5   Hemoglobin 12.0 - 15.0 g/dL 8.9  9.8  88.7   Hematocrit 36.0 - 46.0 % 27.5  30.8  35.2   Platelets 150 - 400 K/uL 274  278  275       Latest Ref Rng & Units 12/23/2023    3:51 AM 12/22/2023    7:17 AM 12/22/2023    3:34 AM  CMP  Glucose 70 - 99 mg/dL 708  828  834   BUN 8 - 23 mg/dL 16  16  16    Creatinine 0.44 - 1.00 mg/dL 9.20  9.14  9.30   Sodium 135 - 145 mmol/L 132  134  133   Potassium 3.5 - 5.1 mmol/L 4.5  4.5  4.3   Chloride 98 - 111 mmol/L 102  100  101   CO2 22 - 32 mmol/L 23  25  23    Calcium  8.9 - 10.3 mg/dL 8.3  8.6  8.3    Last hemoglobin A1c 6.9   on 12/07/2023  TSH and free T4 normal September 2024  TSH over 42 on 11/26/2023, down to 11 on 12/21/2023 (no free T4 on those occasions), and normal at 5.2 on 01/20/2024  Radiologic Studies:  CLINICAL DATA:  Abdominal pain, acute, nonlocalized   EXAM: CT ABDOMEN AND PELVIS WITHOUT CONTRAST   TECHNIQUE: Multidetector CT imaging of the abdomen and pelvis was performed following the standard protocol without IV contrast.   RADIATION DOSE REDUCTION: This exam was performed according to the departmental dose-optimization program which includes automated exposure control, adjustment of the mA and/or kV according to patient size and/or use of iterative reconstruction  technique.   COMPARISON:  01/23/2022   FINDINGS: Of note, the lack of intravenous contrast limits evaluation of the solid organ parenchyma and vascularity.   Lower chest: No focal airspace consolidation or pleural effusion.   Hepatobiliary: No mass.2 cm cyst in the left hepatic lobe.No radiopaque stones or wall thickening of the gallbladder. No intrahepatic or extrahepatic biliary ductal dilation.   Pancreas: No mass or main ductal dilation. No peripancreatic inflammation or fluid collection.   Spleen: Normal size. No mass.   Adrenals/Urinary Tract: No adrenal masses. No renal mass. No hydronephrosis or nephrolithiasis. The urinary bladder is distended without focal abnormality.   Stomach/Bowel: The stomach is decompressed without focal abnormality. No small bowel wall thickening or inflammation. No small bowel obstruction.Appendectomy Total  colonic diverticulosis. No changes of acute diverticulitis. Fecal ball in the rectum, measuring 5.3 x 4.8 x 11.6 cm.   Vascular/Lymphatic: No aortic aneurysm. Diffuse aortoiliac atherosclerosis. No intraabdominal or pelvic lymphadenopathy.   Reproductive: Age-related atrophy of the uterus and ovaries. No concerning adnexal mass. No free pelvic fluid.   Other: No pneumoperitoneum or ascites.  Moderate presacral stranding   Musculoskeletal: No acute fracture or destructive lesion. 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 Mild bilateral hip osteoarthritis.   IMPRESSION: 1. Moderate presacral stranding in the pelvis, which may be due to a fecal ball in the rectum, measuring 5.3 x 4.8 x 11.6 cm, as can be seen with early stercoral colitis. Alternatively, this could reflect changes from an infectious or inflammatory proctitis or related to patient's volume status. 2. Total colonic diverticulosis. No changes of acute diverticulitis.   Aortic Atherosclerosis (ICD10-I70.0).      Electronically Signed   By: Rogelia Myers M.D.   On: 12/21/2023 18:31  _________________________   Narrative & Impression  EXAM: 2 VIEW XRAY OF THE ABDOMEN 01/20/2024 03:50:44 PM   COMPARISON: 01/04/2024   CLINICAL HISTORY: chronic constipation, ongoing   FINDINGS:   BOWEL: Moderate colonic stool burden diffusely. Nonobstructive bowel gas pattern.   SOFT TISSUES: Right lower quadrant surgical clips noted. External device overlies the right lower quadrant. No opaque urinary calculi.   BONES: Degenerative changes of the lumbar spine. No acute osseous abnormality.   IMPRESSION: 1. Moderate colonic stool burden, diffuse distribution.   Electronically signed by: Waddell Calk MD 01/20/2024 03:58 PM EST RP Workstation: [id number]   Images personally reviewed.-H Legrand MD    Encounter Diagnoses  Name Primary?   Acute constipation Yes   Generalized abdominal pain    Abdominal bloating    Abnormal finding on GI tract imaging     Assessment and Plan Assessment & Plan Constipation with recent severe impaction and ongoing abnormal bowel movements Severe constipation with impaction post-knee replacement, exacerbated by hypothyroidism, GLP-1 agonist, and limited mobility. X-ray showed stool in colon without obstruction. CT scan indicated rectal wall inflammation due to impaction, likely non-infectious. Recovery expected as contributing factors resolve. - Continue stool softeners. -She has already discontinued a tea that she has been taking for constipation (probably containing senna). - Hold Linzess prescription until further evaluation. - Message primary care to discuss reducing Ozempic  dose to 0.25 mg temporarily.  Will also ask about temporarily halving the dose of metformin . - Monitor bowel movements and symptoms over the next week to ten days. - Contact provider via portal in one week to ten days to reassess bowel movement status.  Abdominal pain, distension, and  associated gastrointestinal symptoms (nausea, decreased appetite) Abdominal pain and distension likely secondary to constipation and impaction. Nausea and decreased appetite possibly related to gastrointestinal motility issues and recent bowel regimen. Symptoms expected to improve as bowel function normalizes. - Monitor symptoms and reassess in follow-up communication.  I suspect this will all improve considerably with some time, perhaps fewer medications, and normalization of her hypothyroidism after recently reinstituting treatment.  Her GLP-1 agonist was resumed within a couple weeks after the surgery which also coincides with this severe constipation.  I would like to make a modest dose reduction for the time being to just get her back on track.  Will message primary care about this.  Zeeva will reach out to us  next week with an update and we can proceed accordingly regarding any other medicine adjustments.  45 minutes were spent on this encounter, including in depth chart review, independent review of results as outlined above, communicating results with the patient directly, face-to-face time with the patient, coordinating care, ordering studies and medications as appropriate, and documentation.  (25 minutes of face-to-face time with patient) Extensive records required in review  Victory LITTIE Brand III  CC: Referring provider noted above
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
## 2024-02-15 ENCOUNTER — Encounter: Attending: Internal Medicine | Admitting: Dietician

## 2024-02-15 ENCOUNTER — Encounter: Payer: Self-pay | Admitting: Dietician

## 2024-02-15 VITALS — Ht 65.0 in | Wt 238.1 lb

## 2024-02-15 DIAGNOSIS — E1110 Type 2 diabetes mellitus with ketoacidosis without coma: Secondary | ICD-10-CM | POA: Insufficient documentation

## 2024-02-15 DIAGNOSIS — E1165 Type 2 diabetes mellitus with hyperglycemia: Secondary | ICD-10-CM

## 2024-02-15 DIAGNOSIS — E119 Type 2 diabetes mellitus without complications: Secondary | ICD-10-CM | POA: Diagnosis present

## 2024-02-15 DIAGNOSIS — Z713 Dietary counseling and surveillance: Secondary | ICD-10-CM | POA: Diagnosis not present

## 2024-02-15 NOTE — Patient Instructions (Addendum)
 Eat a snack before bedtime, including a protein food + a starch or fruit.  Examples: crackers and peanut butter or cheese; fruit and yogurt; fruit and nuts; cheese and 3 cups popcorn. This might help prevent low blood sugar during the night. Follow a schedule of eating a meal or snack every 3-5 hours during the day; continue with current 10am, 2pm, 6pm + bedtime snack -- do not skip meals even if blood sugar is high Good job eating well-balanced meals, keep it up

## 2024-02-15 NOTE — Progress Notes (Signed)
 Diabetes Self-Management Education  Visit Type: Follow-up  Appt. Start Time: 1315 Appt. End Time: 1450  02/15/2024  [name], identified by name and date of birth, is a 78 y.o. female with a diagnosis of Diabetes:  .   ASSESSMENT  Height 5' 5 (1.651 m), weight 238 lb 1.6 oz (108 kg). Body mass index is 39.62 kg/m.   Diabetes Self-Management Education - 02/15/24 1344       Visit Information   Visit Type Follow-up      Health Coping   How would you rate your overall health? Good      Psychosocial Assessment   Patient Belief/Attitude about Diabetes Defeat/Burnout   also hopeful   What is the hardest part about your diabetes right now, causing you the most concern, or is the most worrisome to you about your diabetes?   Other (comment)   managing highs and lows (blood sugars)     Pre-Education Assessment   Patient understands the diabetes disease and treatment process. Comprehends key points    Patient understands incorporating nutritional management into lifestyle. Needs Review    Patient undertands incorporating physical activity into lifestyle. Needs Review    Patient understands using medications safely. Demonstrates understanding / competency    Patient understands monitoring blood glucose, interpreting and using results Demonstrates understanding / competency    Patient understands prevention, detection, and treatment of acute complications. Comprehends key points    Patient understands prevention, detection, and treatment of chronic complications. Needs Review    Patient understands how to develop strategies to address psychosocial issues. Needs Review    Patient understands how to develop strategies to promote health/change behavior. Demonstrates understanding / competency      Complications   Last HgB A1C per patient/outside source 7.6 %   12/2023   How often do you check your blood sugar? > 4 times/day    Fasting Blood glucose range (mg/dL)  <29;29-870;869-820;819-799;>799    Postprandial Blood glucose range (mg/dL) 819-799;869-820;29-870;<29;>799   40s - 300   Number of hypoglycemic episodes per month 1    Can you tell when your blood sugar is low? Yes   dizzy, confused/ disoriented, strobing vision   What do you do if your blood sugar is low? drink juice or glucose tablets (x4)    Number of hyperglycemic episodes ( >200mg /dL): Daily    Have you had a dilated eye exam in the past 12 months? Yes    Have you had a dental exam in the past 12 months? Yes    Are you checking your feet? Yes    How many days per week are you checking your feet? 2      Dietary Intake   Breakfast 1 boiled egg, sometimes 1 strip bacon or 1 sausage patty    Lunch usually leftovers from previous dinner; sandwich; homemade soup veg/ chicken/ chili    Snack (afternoon) Pkg peanut butter crackers; almonds    Dinner spaghetti with small portion pasta (every few weeks) + veg; small portion lean meat often chicken + 1-2 veg ie salad/ spinach/ ; homemade soup; salad + protein; sub sandwich    Snack (evening) occ nuts during the night after low glucose    Beverage(s) water,      Activity / Exercise   Activity / Exercise Type ADL's      Patient Education   Previous Diabetes Education Yes   06/2018   Disease Pathophysiology Definition of diabetes, type 1 and 2, and the  diagnosis of diabetes    Healthy Eating Food label reading, portion sizes and measuring food.;Carbohydrate counting;Meal timing in regards to the patients' current diabetes medication.;Meal options for control of blood glucose level and chronic complications.    Monitoring Taught/discussed recording of test results and interpretation of SMBG.;Interpreting lab values - A1C, lipid, urine microalbumina.    Acute complications Taught prevention, symptoms, and  treatment of hypoglycemia - the 15 rule.      Individualized Goals (developed by patient)   Nutrition Other (comment)   eat a bedtime snack to  prevent nighttime lows     Outcomes   Expected Outcomes Demonstrated interest in learning. Expect positive outcomes    Future DMSE 4-6 wks      Subsequent Visit   Since your last visit have you continued or begun to take your medications as prescribed? Yes    Since your last visit have you had your blood pressure checked? Yes    Since your last visit have you experienced any weight changes? Loss          Individualized Plan for Diabetes Self-Management Training:   Learning Objective:  Patient will have a greater understanding of diabetes self-management. Patient education plan is to attend individual and/or group sessions per assessed needs and concerns.   Plan:   Patient Instructions  Eat a snack before bedtime, including a protein food + a starch or fruit.  Examples: crackers and peanut butter or cheese; fruit and yogurt; fruit and nuts; cheese and 3 cups popcorn. This might help prevent low blood sugar during the night. Follow a schedule of eating a meal or snack every 3-5 hours during the day; continue with current 10am, 2pm, 6pm + bedtime snack -- do not skip meals even if blood sugar is high Good job eating well-balanced meals, keep it up  Expected Outcomes:  Demonstrated interest in learning. Expect positive outcomes  Education material provided: ADA - How to Thrive: A Guide for Your Journey with Diabetes; Plate planner with food lists; snacking handout  If problems or questions, patient to contact team via:  Phone and/or patient portal  Future DSME appointment: 4-6 wks

## 2024-02-17 ENCOUNTER — Ambulatory Visit: Admitting: Primary Care

## 2024-02-18 ENCOUNTER — Encounter: Payer: Self-pay | Admitting: Internal Medicine

## 2024-02-18 ENCOUNTER — Ambulatory Visit: Admitting: Internal Medicine

## 2024-02-18 ENCOUNTER — Telehealth: Payer: Self-pay

## 2024-02-18 VITALS — BP 110/70 | HR 86 | Temp 98.8°F | Ht 65.0 in | Wt 240.4 lb

## 2024-02-18 DIAGNOSIS — G4733 Obstructive sleep apnea (adult) (pediatric): Secondary | ICD-10-CM

## 2024-02-18 DIAGNOSIS — J4520 Mild intermittent asthma, uncomplicated: Secondary | ICD-10-CM

## 2024-02-18 DIAGNOSIS — R0602 Shortness of breath: Secondary | ICD-10-CM

## 2024-02-18 NOTE — Progress Notes (Unsigned)
 Name: Molly Wall MRN: [id number] DOB: [birth year]    CHIEF COMPLAINT: Follow-up assessment for OSA Follow-up assessment for asthma  HISTORY OF PRESENT ILLNESS:  In 2020 patient was diagnosed with sleep apnea with AHI of 13 Patient has been on auto CPAP ever since  Current CPAP compliance report shows AHI 15 Auto CPAP 4-20 is recommended We will plan to refer to Dr. 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  has a past surgical history that includes Shoulder surgery (1976); Knee surgery (2014); Appendectomy; Ganglion cyst excision; Incision and drainage abscess anal; Colonoscopy; Breast lumpectomy with radioactive seed localization (Right, 08/12/2017); Tonsillectomy; Breast lumpectomy (Right); Breast biopsy (Left, 07/10/2023); Knee Arthroplasty (Left, 07/20/2023); and Knee Arthroplasty (Right, 12/14/2023). Prior to Admission medications   Medication Sig Start Date End Date Taking? Authorizing Provider  albuterol  (VENTOLIN  HFA) 108 (90 Base) MCG/ACT inhaler TAKE 2 PUFFS BY MOUTH EVERY 6 HOURS AS NEEDED FOR WHEEZE/SHORTNESS OF BREATH 04/23/23   Rilla Baller, MD  atorvastatin  (LIPITOR) 20 MG tablet TAKE 1 TABLET BY MOUTH EVERY DAY FOR CHOLESTEROL 12/17/22   Gretta Comer POUR, NP  BD PEN NEEDLE NANO 2ND GEN 32G X 4 MM MISC USE AS DIRECTED 3 TIMES A DAY 08/19/22   Trixie File, MD   Continuous Blood Gluc Receiver (DEXCOM G6 RECEIVER) DEVI Use to monitor blood sugar 12/11/20   Gretta Comer POUR, NP  Continuous Glucose Sensor (DEXCOM G7 SENSOR) MISC Use to check glucose continuously,change every 10 days Dx Code: E11.65 05/15/23   Trixie File, MD  Continuous Glucose Transmitter (DEXCOM G6 TRANSMITTER) MISC REPLACE EVERY 3 (THREE) MONTHS. 01/29/23   Trixie File, MD  fluticasone  (FLONASE ) 50 MCG/ACT nasal spray INSTILL 1 SPRAY IN EACH NOSTRIL TWICE A DAY AS NEEDED FOR ALLERGIES OR RHINITIS 01/07/23   Clark, Katherine K, NP  gabapentin  (NEURONTIN ) 100 MG capsule Take 1-3 capsules by mouth once to twice daily for pain. Patient not taking: Reported on 05/15/2023 10/31/21   Clark, Katherine K, NP  Glucagon  3 MG/DOSE POWD Place 3 mg into the nose once as needed for up to 1 dose. 05/01/23   Trixie File, MD  glucose blood (ONETOUCH VERIO) test strip Test blood sugar up to three times daily for diabetes. 02/25/23   Gretta Comer POUR, NP  insulin  aspart (NOVOLOG  FLEXPEN) 100 UNIT/ML FlexPen Inject 25 Units into the skin 3 (three) times daily before meals. 04/27/23   Trixie File, MD  Insulin  Disposable Pump (V-GO 30) 30 UNIT/24HR KIT USE AS DIRECTED 05/04/23   Trixie File, MD  insulin  lispro (HUMALOG  KWIKPEN) 100 UNIT/ML KwikPen INJECT 25 UNITS INTO THE SKIN 3 (THREE) TIMES DAILY WITH MEALS 10/21/22   Trixie File, MD  levothyroxine  (SYNTHROID ) 88 MCG tablet TAKE 1 AND 1/2 TABLET BY MOUTH EVERY SUNDAY. TAKE 1 TABLET BY MOUTH MON THROUGH SAT. TAKE ON AN EMPTY STOMACH WITH WATER ONLY. NO FOOD OR OTHER MEDICATIONS FOR 30 MINUTES. 12/17/22   Clark, Katherine K, NP  metFORMIN  (GLUCOPHAGE -XR) 500 MG 24 hr tablet TAKE  2 TABLETS (1,000 MG TOTAL) BY MOUTH DAILY WITH BREAKFAST. FOR DIABETES. 12/22/22   Trixie File, MD  montelukast  (SINGULAIR ) 10 MG tablet TAKE 1 TAB BY MOUTH AT BEDTIME FOR ALLERGIES AND ASTHMA. 12/17/22   Clark, Katherine K, NP  PREBIOTIC PRODUCT PO Take 1  capsule by mouth daily.    [provider]  Semaglutide , 1 MG/DOSE, (OZEMPIC , 1 MG/DOSE,) 4 MG/3ML SOPN INJECT 1MG  INTO THE SKIN ONCE A WEEK 07/22/22   Trixie File, MD  NAPOLEON INHUB 250-50 MCG/ACT AEPB INHALE 1 PUFF BY MOUTH TWICE A DAY Patient taking differently: Inhale 1 puff into the lungs daily. 12/05/22   Clark, Katherine K, NP   Allergies  Allergen Reactions   Iodine-131 Anaphylaxis    Pt states when she was 78=she drank an experimental Iodinated Radioisotope for her thyroid . She described it was a small amount in a milk carton in [place] . She states they stopped using it shortly after because the trial wasn't successful. SPM   Prednisone      Make sugar jump way up.    FAMILY HISTORY:  family history includes Alzheimer's disease in her mother; Arthritis in her father; Asthma in her mother; Breast cancer (age of onset: 76) in her sister; Breast cancer (age of onset: 23) in her daughter; Colon cancer in her paternal grandmother; Dementia in her mother; Healthy in her brother, daughter, daughter, and sister; Heart attack (age of onset: 34) in her father; Liver cancer in her maternal uncle. SOCIAL HISTORY:  reports that she has never smoked. She has never been exposed to tobacco smoke. She has never used smokeless tobacco. She reports that she does not currently use alcohol. She reports that she does not use drugs. There were no vitals taken for this visit.  BP 110/70   Pulse 86   Temp 98.8 F (37.1 C)   Ht 5' 5 (1.651 m)   Wt 240 lb 6.4 oz (109 kg)   SpO2 95%   BMI 40.00 kg/m    Physical Examination:  General Appearance: No distress  EYES EOM intact.   NECK Supple, No JVD Pulmonary: normal breath sounds, No wheezing.  CardiovascularNormal S1,S2.  No m/r/g.   Ext pulses intact, cap refill intact  ALL OTHER ROS ARE NEGATIVE     ASSESSMENT AND PLAN SYNOPSIS  78 year old pleasant white female seen today for follow up assessment of asthma and obstructive  sleep apnea in the setting of obesity and deconditioned state   Assessment of OSA Referral to Dr. Jess for further assessment  No evidence of acute heart failure at this time No respiratory distress No fevers, chills, nausea, vomiting, diarrhea No evidence hemoptysis  Patient Instructions Continue to use CPAP every night, minimum of 4-6 hours a night.  Change equipment every 30 days or as directed by DME.  Wash your tubing with warm soap and water daily, hang to dry. Wash humidifier portion weekly. Use bottled, distilled water and change daily    Assessment of asthma Well-controlled No exacerbation at this time Uses Wixela and albuterol  as needed  Avoid Allergens and Irritants Avoid secondhand smoke Avoid SICK contacts Recommend  Masking  when appropriate Recommend Keep up-to-date with vaccinations'  MEDICATION ADJUSTMENTS/LABS AND TESTS ORDERED: Continue to use inhalers as needed Wixela as needed Referral to Dr. Jess   CURRENT MEDICATIONS REVIEWED AT LENGTH WITH PATIENT TODAY   Patient  satisfied with Plan of action and management. All questions answered   Follow up 6 months   I spent a total of  46 minutes dedicated to the care of this patient on the date of this encounter to include pre-visit review of records, face-to-face time with the patient discussing conditions above, post visit ordering of testing, clinical documentation with the electronic health record, making appropriate referrals as documented, and communicating necessary information to the patient's healthcare team.    The Patient requires high complexity decision making for assessment and support, frequent evaluation and titration of therapies, application of advanced monitoring technologies and extensive interpretation of multiple databases.  Patient satisfied with Plan of action and management. All questions answered    Nickolas Alm Cellar, M.D.  [facility]  Medical  Director [facility]

## 2024-02-18 NOTE — Patient Instructions (Signed)
 Recommend referral to Dr. Jess for further sleep apnea assessment  Continue Wixela as needed Continue albuterol  as needed Avoid Allergens and Irritants Avoid secondhand smoke Avoid SICK contacts Recommend  Masking  when appropriate Recommend Keep up-to-date with vaccinations

## 2024-02-18 NOTE — Telephone Encounter (Unsigned)
 Copied from CRM #[id number]. Topic: Clinical - Medication Question >> Feb 18, 2024  3:40 PM Thersia C wrote: Reason for CRM: Patient called in stated she is going to get her teeth clean in a couple of week, stated she needs to get precription from her doctor for anitbiotics   Needs I sent to  [id number], [place] - [address] [place] [address] Phone: [phone] Fax: [phone]

## 2024-02-18 NOTE — Telephone Encounter (Signed)
 Please call patient:  Molly news!  According to current guidelines and multiple previous studies, people do not need antibiotics prior to a teeth cleaning/procedures.  The only people that would require this are those that have had heart valve surgery.

## 2024-02-19 NOTE — Telephone Encounter (Signed)
 Spoke with patient and notified her of Kate's response. Patient verbalized understanding and stated that she will let her dentist office know.

## 2024-02-23 ENCOUNTER — Inpatient Hospital Stay: Admission: RE | Admit: 2024-02-23 | Discharge: 2024-02-23 | Attending: Hematology and Oncology

## 2024-02-23 DIAGNOSIS — N6091 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: Secondary | ICD-10-CM

## 2024-02-23 MED ORDER — GADOPICLENOL 0.5 MMOL/ML IV SOLN
10.0000 mL | Freq: Once | INTRAVENOUS | Status: AC | PRN
  Administered 2024-02-23: 10 mL via INTRAVENOUS

## 2024-02-24 ENCOUNTER — Other Ambulatory Visit: Payer: Self-pay | Admitting: Hematology and Oncology

## 2024-02-24 DIAGNOSIS — N6091 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: Secondary | ICD-10-CM

## 2024-02-24 DIAGNOSIS — Z1231 Encounter for screening mammogram for malignant neoplasm of breast: Secondary | ICD-10-CM

## 2024-02-24 NOTE — Progress Notes (Signed)
 Screening mammogram ordered

## 2024-02-25 ENCOUNTER — Ambulatory Visit: Admitting: Physician Assistant

## 2024-02-25 ENCOUNTER — Encounter: Payer: Self-pay | Admitting: Physician Assistant

## 2024-02-25 ENCOUNTER — Other Ambulatory Visit: Payer: Self-pay | Admitting: Rheumatology

## 2024-02-25 VITALS — BP 135/73 | HR 83 | Temp 97.8°F | Resp 14 | Ht 64.5 in | Wt 241.8 lb

## 2024-02-25 DIAGNOSIS — E1165 Type 2 diabetes mellitus with hyperglycemia: Secondary | ICD-10-CM | POA: Diagnosis not present

## 2024-02-25 DIAGNOSIS — Z96652 Presence of left artificial knee joint: Secondary | ICD-10-CM | POA: Diagnosis not present

## 2024-02-25 DIAGNOSIS — E559 Vitamin D deficiency, unspecified: Secondary | ICD-10-CM | POA: Insufficient documentation

## 2024-02-25 DIAGNOSIS — R7681 Abnormal rheumatoid factor and anti-citrullinated protein antibody without rheumatoid arthritis: Secondary | ICD-10-CM | POA: Diagnosis not present

## 2024-02-25 DIAGNOSIS — G894 Chronic pain syndrome: Secondary | ICD-10-CM

## 2024-02-25 DIAGNOSIS — Z96651 Presence of right artificial knee joint: Secondary | ICD-10-CM | POA: Insufficient documentation

## 2024-02-25 DIAGNOSIS — Z8639 Personal history of other endocrine, nutritional and metabolic disease: Secondary | ICD-10-CM | POA: Diagnosis present

## 2024-02-25 DIAGNOSIS — Z803 Family history of malignant neoplasm of breast: Secondary | ICD-10-CM | POA: Diagnosis present

## 2024-02-25 DIAGNOSIS — N6091 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: Secondary | ICD-10-CM

## 2024-02-25 DIAGNOSIS — Q667 Congenital pes cavus, unspecified foot: Secondary | ICD-10-CM | POA: Diagnosis present

## 2024-02-25 DIAGNOSIS — M1711 Unilateral primary osteoarthritis, right knee: Secondary | ICD-10-CM

## 2024-02-25 DIAGNOSIS — J4521 Mild intermittent asthma with (acute) exacerbation: Secondary | ICD-10-CM | POA: Diagnosis present

## 2024-02-25 DIAGNOSIS — Z800 Family history of malignant neoplasm of digestive organs: Secondary | ICD-10-CM

## 2024-02-25 DIAGNOSIS — M19041 Primary osteoarthritis, right hand: Secondary | ICD-10-CM | POA: Diagnosis present

## 2024-02-25 DIAGNOSIS — M810 Age-related osteoporosis without current pathological fracture: Secondary | ICD-10-CM

## 2024-02-25 DIAGNOSIS — M4310 Spondylolisthesis, site unspecified: Secondary | ICD-10-CM | POA: Insufficient documentation

## 2024-02-25 DIAGNOSIS — M47816 Spondylosis without myelopathy or radiculopathy, lumbar region: Secondary | ICD-10-CM | POA: Diagnosis present

## 2024-02-25 DIAGNOSIS — Z5181 Encounter for therapeutic drug level monitoring: Secondary | ICD-10-CM | POA: Diagnosis present

## 2024-02-25 DIAGNOSIS — Z794 Long term (current) use of insulin: Secondary | ICD-10-CM | POA: Insufficient documentation

## 2024-02-25 DIAGNOSIS — M19042 Primary osteoarthritis, left hand: Secondary | ICD-10-CM | POA: Diagnosis present

## 2024-02-25 NOTE — Telephone Encounter (Signed)
 Last Fill: 12/01/2023  Labs: 12/23/2023 BMP and CBC: sodium 132, glucose 291, calcium  8.3, WBC 11.1, RBC 3.23, hemoglobin 8.9, HCT 27.5  Next Visit: 02/25/2024  Last Visit: 11/26/2023  DX: Age-related osteoporosis without current pathological fracture   Current Dose per office note on 11/26/2023: Fosamax  70 mg p.o. weekly   Okay to refill Fosamax ?

## 2024-02-26 ENCOUNTER — Ambulatory Visit: Admitting: Primary Care

## 2024-02-26 ENCOUNTER — Encounter: Payer: Self-pay | Admitting: Primary Care

## 2024-02-26 VITALS — BP 124/70 | HR 83 | Temp 97.8°F | Ht 65.0 in | Wt 241.4 lb

## 2024-02-26 DIAGNOSIS — E785 Hyperlipidemia, unspecified: Secondary | ICD-10-CM | POA: Diagnosis not present

## 2024-02-26 DIAGNOSIS — K5909 Other constipation: Secondary | ICD-10-CM

## 2024-02-26 DIAGNOSIS — E039 Hypothyroidism, unspecified: Secondary | ICD-10-CM | POA: Diagnosis not present

## 2024-02-26 NOTE — Assessment & Plan Note (Addendum)
 Improving. Exam today reassuring.  We discussed the concern that her bowels have yet to return to normal, including numerous small stool pieces.  With each movement.  She will trial Linzess  1 to 2 days weekly to see if this helps to restore her normal movements. Continue Colace.  She will update.

## 2024-02-26 NOTE — Patient Instructions (Signed)
 Try taking Linzess  capsules 1-2 times weekly for stools.  Schedule a lab only appointment for 2 to 3 weeks to check your cholesterol and thyroid .  Make sure that you have fasted at least 4 hours prior to the lab appointment.  It was a pleasure to see you today!

## 2024-02-26 NOTE — Progress Notes (Signed)
 Subjective:    Patient ID: Molly Wall, female    DOB: 1945-05-10, 78 y.o.   MRN: [id number]  Molly Wall is a very pleasant 78 y.o. female with a history of asthma, OSA, opioid-induced constipation, GERD, hypothyroidism, type 2 diabetes, chronic back pain, hyperlipidemia who presents today for follow up of constipation.   She was last evaluated on 01/20/24 for ED follow up for lower abdominal pain and severe constipation. She had failed numerous OTC and prescription products including GoLytely . During her last visit she continued to experience constipation. She had previously been inconsistent with levothyroxine  for months.  She was initiated on Linzess  72 mcg as needed and referred to GI.  Since her last visit her bowels are moving.  She never started Linzess  as she did not need it.  Her stools consist of numerous small firm pieces of stool.  She is having bowel movements 2 times daily.  She continues with Colace regularly.  She denies abdominal pain.   Review of Systems  Respiratory:  Negative for shortness of breath.   Cardiovascular:  Negative for chest pain.  Gastrointestinal:  Negative for abdominal pain, constipation and diarrhea.         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   Breast cancer (HCC)    Diabetes mellitus without complication (HCC)    Family history of breast cancer    Family history of colon cancer    Hypercholesteremia    Hypothyroidism    Lumbar herniated disc    4-5th lumbar   OSA (obstructive sleep apnea)    used to use CPAP, not any more over 1 1/2 yrs   Sleep apnea    no cpap in 2 yrs    Vitamin D  deficiency     Social History   Socioeconomic History   Marital status: Divorced    Spouse name: Not on file   Number of children: 2   Years of education: Not on file   Highest education level: Master's degree (e.g., MA, MS,  MEng, MEd, MSW, MBA)  Occupational History   Not on file  Tobacco Use   Smoking status: Never    Passive exposure: Never   Smokeless tobacco: Never  Vaping Use   Vaping status: Never Used  Substance and Sexual Activity   Alcohol use: Not Currently   Drug use: No   Sexual activity: Not Currently  Other Topics Concern   Not on file  Social History Narrative   Single.   Moved from [place]  to [place] several weeks ago.   Family lives in [place].   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   Social Drivers of Health   Tobacco Use: Low Risk (02/26/2024)   Patient History    Smoking Tobacco Use: Never    Smokeless Tobacco Use: Never    Passive Exposure: Never  Recent Concern: Tobacco Use - Medium Risk (01/28/2024)   Received from [facility]   Patient History    Smoking Tobacco Use: Former    Smokeless Tobacco Use: Never    Passive Exposure: Not on file  Financial Resource Strain: Low Risk  (01/28/2024)   Received from [facility]   Overall Financial Resource Strain (CARDIA)    Difficulty of Paying Living  Expenses: Not hard at all  Food Insecurity: No Food Insecurity (01/28/2024)   Received from [facility]   Epic    Within the past 12 months, you worried that your food would run out before you got the money to buy more.: Never true    Within the past 12 months, the food you bought just didn't last and you didn't have money to get more.: Never true  Transportation Needs: No Transportation Needs (01/28/2024)   Received from [facility] - Transportation    In the past 12 months, has lack of transportation kept you from medical appointments or from getting medications?: No    Lack of Transportation (Non-Medical): No  Physical Activity: Insufficiently Active (12/07/2023)   Exercise Vital Sign    Days of Exercise per  Week: 3 days    Minutes of Exercise per Session: 30 min  Stress: No Stress Concern Present (12/07/2023)   [employer] of [employer]ational [employer] - Occupational Stress Questionnaire    Feeling of Stress: Not at all  Social Connections: Moderately Integrated (12/14/2023)   Social Connection and Isolation Panel    Frequency of Communication with Friends and Family: More than three times a week    Frequency of Social Gatherings with Friends and Family: Twice a week    Attends Religious Services: More than 4 times per year    Active Member of [employer] or Organizations: Yes    Attends Banker Meetings: More than 4 times per year    Marital Status: Divorced  Intimate Partner Violence: Not At Risk (12/24/2023)   Epic    Fear of Current or Ex-Partner: No    Emotionally Abused: No    Physically Abused: No    Sexually Abused: No  Depression (PHQ2-9): Low Risk (02/15/2024)   Depression (PHQ2-9)    PHQ-2 Score: 0  Alcohol Screen: Low Risk (10/29/2023)   Alcohol Screen    Last Alcohol Screening Score (AUDIT): 0  Housing: Low Risk  (01/28/2024)   Received from [facility]   Epic    In the last 12 months, was there a time when you were not able to pay the mortgage or rent on time?: No    In the past 12 months, how many times have you moved where you were living?: 0    At any time in the past 12 months, were you homeless or living in a shelter (including now)?: No  Utilities: Not At Risk (01/28/2024)   Received from [facility]   Epic    In the past 12 months has the electric, gas, oil, or water company threatened to shut off services in your home?: No  Health Literacy: Adequate Health Literacy (10/29/2023)   B1300 Health Literacy    Frequency of need for help with medical instructions: Never    Past Surgical History:  Procedure Laterality Date   APPENDECTOMY     BREAST BIOPSY Left 07/10/2023   MM LT BREAST BX W LOC DEV 1ST LESION IMAGE BX SPEC  STEREO GUIDE 07/10/2023 [facility]   BREAST LUMPECTOMY Right    per pt - precancerous   BREAST LUMPECTOMY WITH RADIOACTIVE SEED LOCALIZATION Right 08/12/2017   Procedure: RIGHT BREAST LUMPECTOMY X'S 2 WITH RADIOACTIVE SEED LOCALIZATION X'S 2;  Surgeon: Gail Favorite, MD;  Location: [facility];  Service: General;  Laterality: Right;   [place]     [place] but  given a 10 yr recall per pt    GANGLION CYST EXCISION     78 years old   INCISION AND DRAINAGE ABSCESS ANAL     KNEE ARTHROPLASTY Left 07/20/2023   Procedure: ARTHROPLASTY, KNEE, TOTAL, USING IMAGELESS COMPUTER-ASSISTED NAVIGATION;  Surgeon: Mardee Lynwood SQUIBB, MD;  Location: [facility];  Service: Orthopedics;  Laterality: Left;   KNEE ARTHROPLASTY Right 12/14/2023   Procedure: ARTHROPLASTY, KNEE, TOTAL, USING IMAGELESS COMPUTER-ASSISTED NAVIGATION;  Surgeon: Mardee Lynwood SQUIBB, MD;  Location: [facility];  Service: Orthopedics;  Laterality: Right;   KNEE SURGERY  2014   SHOULDER SURGERY  1976   TONSILLECTOMY     age 46    Family History  Problem Relation Age of Onset   Alzheimer's disease Mother    Asthma Mother    Dementia Mother        cause of death   Arthritis Father    Heart attack Father 2       cause of death   Breast cancer Sister 63   Healthy Sister    Breast cancer Daughter 24   Healthy Daughter    Healthy Daughter    Liver cancer Maternal Uncle    Colon cancer Paternal Grandmother    Healthy Brother    Colon polyps Neg Hx    Rectal cancer Neg Hx    Stomach cancer Neg Hx    Esophageal cancer Neg Hx     Allergies[1]  Medications Ordered Prior to Encounter[2]  BP 124/70   Pulse 83   Temp 97.8 F (36.6 C) (Oral)   Ht 5' 5 (1.651 m)   Wt 241 lb 6 oz (109.5 kg)   SpO2 93%   BMI 40.17 kg/m  Objective:   Physical Exam Cardiovascular:     Rate and Rhythm: Normal rate and regular rhythm.  Pulmonary:     Effort: Pulmonary effort is normal.     Breath sounds:  Normal breath sounds.  Abdominal:     General: Bowel sounds are normal.     Palpations: Abdomen is soft.     Tenderness: There is no abdominal tenderness.  Musculoskeletal:     Cervical back: Neck supple.  Skin:    General: Skin is warm and dry.  Neurological:     Mental Status: She is alert and oriented to person, place, and time.  Psychiatric:        Mood and Affect: Mood normal.     Physical Exam        Assessment & Plan:  Hypothyroidism, unspecified type -     TSH; Future  Hyperlipidemia, unspecified hyperlipidemia type -     Lipid panel; Future  Chronic constipation Assessment & Plan: Improving. Exam today reassuring.  We discussed the concern that her bowels have yet to return to normal, including numerous small stool pieces.  With each movement.  She will trial Linzess  1 to 2 days weekly to see if this helps to restore her normal movements. Continue Colace.  She will update.     Assessment and Plan Assessment & Plan         Comer MARLA Gaskins, NP       [1]  Allergies Allergen Reactions   Iodine-131 Anaphylaxis    Pt states when she was 21=she drank an experimental Iodinated Radioisotope for her thyroid . She described it was a small amount in a milk carton in [place] . She states they stopped using it shortly after because the trial wasn't successful. SPM   Prednisone   Make sugar jump way up.  [2]  Current Outpatient Medications on File Prior to Visit  Medication Sig Dispense Refill   linaclotide  (LINZESS ) 72 MCG capsule Take 1 capsule (72 mcg total) by mouth daily before breakfast. For constipation (Patient taking differently: Take 72 mcg by mouth daily before breakfast. For constipation. As needed.) 30 capsule 0   acetaminophen  (TYLENOL ) 650 MG CR tablet Take 1-2 tablets by mouth 3 (three) times daily as needed for pain.     alendronate  (FOSAMAX ) 70 MG tablet TAKE 1 TABLET BY MOUTH ONCE A WEEK. TAKE WITH A FULL GLASS OF WATER ON AN  EMPTY STOMACH. (Patient not taking: Reported on 02/25/2024) 12 tablet 0   aspirin  EC 81 MG tablet Take 81 mg by mouth 2 (two) times daily. Swallow whole. (Patient not taking: Reported on 02/25/2024)     atorvastatin  (LIPITOR) 20 MG tablet TAKE 1 TABLET BY MOUTH EVERY DAY FOR CHOLESTEROL (Patient not taking: Reported on 02/25/2024) 90 tablet 0   Azelastine  HCl 137 MCG/SPRAY SOLN PLACE 2 SPRAYS INTO BOTH NOSTRILS 2 (TWO) TIMES DAILY. USE IN EACH NOSTRIL AS DIRECTED 30 mL 4   bisacodyl  (DULCOLAX) 10 MG suppository Unwrap and Place 1 suppository (10 mg total) rectally daily as needed for severe constipation. (Patient not taking: Reported on 02/25/2024) 12 suppository 0   bisacodyl  (DULCOLAX) 5 MG EC tablet Take 1 tablet (5 mg total) by mouth daily as needed for moderate constipation. 30 tablet 1   Calcium  Carbonate (CALCIUM  500 PO) Take 1 each by mouth 2 (two) times daily. (Patient not taking: Reported on 02/25/2024)     celecoxib  (CELEBREX ) 200 MG capsule Take 1 capsule (200 mg total) by mouth 2 (two) times daily. 60 capsule 1   Cholecalciferol (VITAMIN D -3 PO) Take 1 each by mouth 2 (two) times daily.     Continuous Glucose Sensor (DEXCOM G7 SENSOR) MISC SMARTSIG:1 Each SUB-Q Once a Month     fluticasone  (FLONASE ) 50 MCG/ACT nasal spray INSTILL 1 SPRAY IN EACH NOSTRIL TWICE A DAY AS NEEDED FOR ALLERGIES OR RHINITIS 48 mL 0   Glucagon  3 MG/DOSE POWD Place 3 mg into the nose once as needed for up to 1 dose. 1 each 11   Insulin  Disposable Pump (OMNIPOD 5 DEXG7G6 PODS GEN 5) MISC Inject 1 each into the skin.     insulin  lispro (HUMALOG  KWIKPEN) 100 UNIT/ML KwikPen INJECT 25 UNITS INTO THE SKIN 3 (THREE) TIMES DAILY WITH MEALS 15 mL PRN   ipratropium (ATROVENT ) 0.03 % nasal spray Place 2 sprays into the nose 4 (four) times daily as needed.     levothyroxine  (SYNTHROID ) 88 MCG tablet TAKE 1 AND 1/2 TABLET BY MOUTH EVERY SUNDAY. TAKE 1 TABLET BY MOUTH MON THROUGH SAT. TAKE ON AN EMPTY STOMACH WITH WATER ONLY.  NO FOOD OR OTHER MEDICATIONS FOR 30 MINUTES. 96 tablet 0   metFORMIN  (GLUCOPHAGE -XR) 500 MG 24 hr tablet TAKE 2 TABLETS (1,000 MG TOTAL) BY MOUTH DAILY WITH BREAKFAST. FOR DIABETES. (Patient not taking: Reported on 02/25/2024) 180 tablet 1   NOVOLOG  100 UNIT/ML injection INJECT UP TO 70 UNITS INTO THE SKIN ONCE FOR 1 DOSE. VIA PUMP 70 mL 5   polyethylene glycol powder (GLYCOLAX /MIRALAX ) 17 GM/SCOOP powder Take 17 g by mouth 2 (two) times daily. Dissolve 1 capful (17g) in 4-8 ounces of liquid and take by mouth two times daily. 238 g 0   Probiotic Product (PROBIOTIC PO) Take by mouth.     Semaglutide , 1 MG/DOSE, (OZEMPIC , 1 MG/DOSE,)  4 MG/3ML SOPN INJECT 1 MG INTO THE SKIN ONE TIME PER WEEK (Patient not taking: Reported on 02/25/2024) 3 mL 11   TURMERIC PO Take by mouth.     Vitamin D , Ergocalciferol , (DRISDOL ) 1.25 MG (50000 UNIT) CAPS capsule Take 50,000 Units by mouth every 7 (seven) days. (Patient not taking: Reported on 02/25/2024)     No current facility-administered medications on file prior to visit.

## 2024-03-01 ENCOUNTER — Encounter: Payer: Self-pay | Admitting: *Deleted

## 2024-03-02 ENCOUNTER — Other Ambulatory Visit: Payer: Self-pay | Admitting: Primary Care

## 2024-03-02 DIAGNOSIS — E785 Hyperlipidemia, unspecified: Secondary | ICD-10-CM

## 2024-03-13 ENCOUNTER — Other Ambulatory Visit: Payer: Self-pay | Admitting: Primary Care

## 2024-03-13 DIAGNOSIS — E039 Hypothyroidism, unspecified: Secondary | ICD-10-CM

## 2024-03-14 ENCOUNTER — Telehealth: Payer: Self-pay | Admitting: Adult Health

## 2024-03-14 NOTE — Telephone Encounter (Signed)
 spoke to pt about rescheduling 2/27 appt.Pt has been made aware of new appt date and time.

## 2024-03-20 ENCOUNTER — Other Ambulatory Visit: Payer: Self-pay | Admitting: Primary Care

## 2024-03-20 DIAGNOSIS — J302 Other seasonal allergic rhinitis: Secondary | ICD-10-CM

## 2024-03-21 ENCOUNTER — Encounter: Admitting: Dietician

## 2024-03-22 ENCOUNTER — Other Ambulatory Visit (INDEPENDENT_AMBULATORY_CARE_PROVIDER_SITE_OTHER)

## 2024-03-22 DIAGNOSIS — E039 Hypothyroidism, unspecified: Secondary | ICD-10-CM | POA: Diagnosis not present

## 2024-03-22 DIAGNOSIS — E785 Hyperlipidemia, unspecified: Secondary | ICD-10-CM

## 2024-03-23 ENCOUNTER — Ambulatory Visit: Payer: Self-pay | Admitting: Primary Care

## 2024-03-23 ENCOUNTER — Encounter: Payer: Self-pay | Admitting: Sleep Medicine

## 2024-03-23 ENCOUNTER — Ambulatory Visit: Admitting: Sleep Medicine

## 2024-03-23 VITALS — BP 120/76 | HR 84 | Temp 97.9°F | Ht 65.0 in | Wt 240.2 lb

## 2024-03-23 DIAGNOSIS — E669 Obesity, unspecified: Secondary | ICD-10-CM | POA: Diagnosis not present

## 2024-03-23 DIAGNOSIS — E039 Hypothyroidism, unspecified: Secondary | ICD-10-CM

## 2024-03-23 DIAGNOSIS — G4733 Obstructive sleep apnea (adult) (pediatric): Secondary | ICD-10-CM | POA: Diagnosis not present

## 2024-03-23 DIAGNOSIS — Z6839 Body mass index (BMI) 39.0-39.9, adult: Secondary | ICD-10-CM | POA: Diagnosis not present

## 2024-03-23 LAB — LIPID PANEL
Cholesterol: 228 mg/dL — ABNORMAL HIGH (ref 28–200)
HDL: 86.9 mg/dL
LDL Cholesterol: 123 mg/dL — ABNORMAL HIGH (ref 10–99)
NonHDL: 141.09
Total CHOL/HDL Ratio: 3
Triglycerides: 91 mg/dL (ref 10.0–149.0)
VLDL: 18.2 mg/dL (ref 0.0–40.0)

## 2024-03-23 LAB — TSH: TSH: 5.99 u[IU]/mL — ABNORMAL HIGH (ref 0.35–5.50)

## 2024-03-23 NOTE — Progress Notes (Signed)
 "      Name:Molly Wall MRN: [id number] DOB: 06-25-1945   CHIEF COMPLAINT:  CPAP F/U   HISTORY OF PRESENT ILLNESS: Molly Wall is a 79 y.o. w/ a h/o OSA, hypothyroidism, DMII, hyperlipidemia and obesity who presents for CPAP follow up visit. Reports using CPAP therapy almost every night, which is confirmed by compliance data. Reports that she occasionally forgets to wear her CPAP mask prior to falling asleep. She is currently using the Airfit N30i nasal mask.   Reports nocturnal awakenings due to nocturia, however does not have difficulty falling back to sleep. Reports significant weight changes. Admits to occasional dry mouth. Denies morning headaches, RLS symptoms, dream enactment, cataplexy, hypnagogic or hypnapompic hallucinations. Denies a family history of sleep apnea. Denies drowsy driving. Drinks 2 cups of coffee daily, denies alcohol, tobacco or illicit drug use.   Bedtime 11 pm Sleep onset 20 mins Rise time 8-8:30 am   EPWORTH SLEEP SCORE     05/18/2023    2:24 PM  Results of the Epworth flowsheet  Sitting and reading 1  Watching TV 2  Sitting, inactive in a public place (e.g. 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  has a past surgical history that includes Shoulder surgery (1976); Knee  surgery (2014); Appendectomy; Ganglion cyst excision; Incision and drainage abscess anal; Colonoscopy; Breast lumpectomy with radioactive seed localization (Right, 08/12/2017); Tonsillectomy; Breast lumpectomy (Right); Breast biopsy (Left, 07/10/2023); Knee Arthroplasty (Left, 07/20/2023); and Knee Arthroplasty (Right, 12/14/2023). Prior to Admission medications  Medication Sig Start Date End Date Taking? Authorizing Provider  acetaminophen  (TYLENOL ) 650 MG CR tablet Take 1-2 tablets by mouth 3 (three) times daily as needed for pain.   Yes [provider]  atorvastatin  (LIPITOR) 20 MG tablet TAKE 1 TABLET BY MOUTH EVERY DAY FOR CHOLESTEROL 03/02/24  Yes Clark, Katherine K, NP  Azelastine  HCl 137 MCG/SPRAY SOLN PLACE 2 SPRAYS INTO BOTH NOSTRILS 2 (TWO) TIMES DAILY. USE IN EACH NOSTRIL AS DIRECTED 02/19/24  Yes Tobie Eldora NOVAK, MD  bisacodyl  (DULCOLAX) 5 MG EC tablet Take 1 tablet (5 mg total) by mouth daily as needed for moderate constipation. 12/21/23 12/20/24 Yes Dougherty, Lauren A, PA-C  celecoxib  (CELEBREX ) 200 MG capsule Take 1 capsule (200 mg total) by mouth 2 (two) times daily. 12/15/23  Yes Drake Chew, PA-C  Cholecalciferol (VITAMIN D -3 PO) Take 1 each by mouth 2 (two) times daily.   Yes [provider]  Continuous Glucose Sensor (DEXCOM G7 SENSOR) MISC SMARTSIG:1 Each SUB-Q Once a Month 01/21/24  Yes [provider]  fluticasone  (FLONASE ) 50 MCG/ACT nasal spray INSTILL 1 SPRAY IN EACH NOSTRIL TWICE A DAY AS NEEDED FOR ALLERGIES OR RHINITIS 03/20/24  Yes Gretta Comer POUR, NP  Glucagon  3 MG/DOSE POWD Place 3 mg into the nose once as needed for up  to 1 dose. 05/01/23  Yes Trixie File, MD  Insulin  Disposable Pump (OMNIPOD 5 DEXG7G6 PODS GEN 5) MISC Inject 1 each into the skin. 01/21/24  Yes [provider]  insulin  lispro (HUMALOG  KWIKPEN) 100 UNIT/ML KwikPen INJECT 25 UNITS INTO THE SKIN 3 (THREE) TIMES DAILY WITH MEALS 10/21/22  Yes Trixie File, MD   ipratropium (ATROVENT ) 0.03 % nasal spray Place 2 sprays into the nose 4 (four) times daily as needed. 11/13/23  Yes [provider]  levothyroxine  (SYNTHROID ) 88 MCG tablet TAKE 1 AND 1/2 TABLET BY MOUTH EVERY SUNDAY. TAKE 1 TABLET BY MOUTH MON THROUGH SAT. TAKE ON AN EMPTY STOMACH WITH WATER ONLY. NO FOOD OR OTHER MEDICATIONS FOR 30 MINUTES. 03/14/24  Yes Clark, Katherine K, NP  linaclotide  (LINZESS ) 72 MCG capsule Take 1 capsule (72 mcg total) by mouth daily before breakfast. For constipation Patient taking differently: Take 72 mcg by mouth daily before breakfast. For constipation. As needed. 01/20/24  Yes Gretta Comer POUR, NP  montelukast  (SINGULAIR ) 10 MG tablet Take 10 mg by mouth at bedtime. 03/15/24  Yes [provider]  NOVOLOG  100 UNIT/ML injection INJECT UP TO 70 UNITS INTO THE SKIN ONCE FOR 1 DOSE. VIA PUMP 07/10/23  Yes Trixie File, MD  polyethylene glycol powder (GLYCOLAX /MIRALAX ) 17 GM/SCOOP powder Take 17 g by mouth 2 (two) times daily. Dissolve 1 capful (17g) in 4-8 ounces of liquid and take by mouth two times daily. 12/23/23  Yes Lue Elsie BROCKS, MD  Probiotic Product (PROBIOTIC PO) Take by mouth.   Yes [provider]  TURMERIC PO Take by mouth.   Yes [provider]  alendronate  (FOSAMAX ) 70 MG tablet TAKE 1 TABLET BY MOUTH ONCE A WEEK. TAKE WITH A FULL GLASS OF WATER ON AN EMPTY STOMACH. Patient not taking: Reported on 03/23/2024 02/25/24   Dolphus Reiter, MD  aspirin  EC 81 MG tablet Take 81 mg by mouth 2 (two) times daily. Swallow whole. Patient not taking: Reported on 03/23/2024    [provider]  bisacodyl  (DULCOLAX) 10 MG suppository Unwrap and Place 1 suppository (10 mg total) rectally daily as needed for severe constipation. Patient not taking: Reported on 03/23/2024 12/23/23   Lue Elsie BROCKS, MD  Calcium  Carbonate (CALCIUM  500 PO) Take 1 each by mouth 2 (two) times daily. Patient not taking: Reported on 03/23/2024     [provider]  metFORMIN  (GLUCOPHAGE -XR) 500 MG 24 hr tablet TAKE 2 TABLETS (1,000 MG TOTAL) BY MOUTH DAILY WITH BREAKFAST. FOR DIABETES. Patient not taking: Reported on 03/23/2024 12/22/22   Trixie File, MD  Semaglutide , 1 MG/DOSE, (OZEMPIC , 1 MG/DOSE,) 4 MG/3ML SOPN INJECT 1 MG INTO THE SKIN ONE TIME PER WEEK Patient not taking: Reported on 03/23/2024 08/14/23   Trixie File, MD  Vitamin D , Ergocalciferol , (DRISDOL ) 1.25 MG (50000 UNIT) CAPS capsule Take 50,000 Units by mouth every 7 (seven) days. Patient not taking: Reported on 03/23/2024 11/26/23   [provider]   Allergies[1]  FAMILY HISTORY:  family history includes Alzheimer's disease in her mother; Arthritis in her father; Asthma in her mother; Breast cancer (age of onset: 30) in her sister; Breast cancer (age of onset: 4) in her daughter; Colon cancer in her paternal grandmother; Dementia in her mother; Healthy in her brother, daughter, daughter, and sister; Heart attack (age of onset: 29) in her father; Liver cancer in her maternal uncle. SOCIAL HISTORY:  reports that she has never smoked. She has never been exposed to tobacco smoke. She has never  used smokeless tobacco. She reports that she does not currently use alcohol. She reports that she does not use drugs.   Review of Systems:  Gen:  Denies  fever, sweats, chills weight loss  HEENT: Denies blurred vision, double vision, ear pain, eye pain, hearing loss, nose bleeds, sore throat Cardiac:  No dizziness, chest pain or heaviness, chest tightness,edema, No JVD Resp:   No cough, -sputum production, -shortness of breath,-wheezing, -hemoptysis,  Gi: Denies swallowing difficulty, stomach pain, nausea or vomiting, diarrhea, constipation, bowel incontinence Gu:  Denies bladder incontinence, burning urine Ext:   Denies Joint pain, stiffness or swelling Skin: Denies  skin rash, easy bruising or bleeding or hives Endoc:  Denies polyuria, polydipsia , polyphagia  or weight change Psych:   Denies depression, insomnia or hallucinations  Other:  All other systems negative  VITAL SIGNS: BP 120/76   Pulse 84   Temp 97.9 F (36.6 C)   Ht 5' 5 (1.651 m)   Wt 240 lb 3.2 oz (109 kg)   SpO2 95%   BMI 39.97 kg/m    Physical Examination:   General Appearance: No distress  EYES PERRLA, EOM intact.   NECK Supple, No JVD Pulmonary: normal breath sounds, No wheezing.  CardiovascularNormal S1,S2.  No m/r/g.   Abdomen: Benign, Soft, non-tender. Skin:   warm, no rashes, no ecchymosis  Extremities: normal, no cyanosis, clubbing. Neuro:without focal findings,  speech normal  PSYCHIATRIC: Mood, affect within normal limits.   ASSESSMENT AND PLAN  OSA Due to elevated AHI mostly comprised of central events, will decrease max pressure to 10 cm H2O. Advised patient to follow up with PCP for further evaluation of underling cause of central sleep apnea. Recommend EKG, stress test and calcium  snore. Also counseled patient on increasing CPAP compliance. Discussed the consequences of untreated sleep apnea. Advised not to drive drowsy for safety of patient and others. Will follow up in 3 months.    Obesity Counseled patient on diet and lifestyle modification.    Patient  satisfied with Plan of action and management. All questions answered  I spent a total of 26 minutes reviewing chart data, face-to-face evaluation with the patient, counseling and coordination of care as detailed above.    Simeon Vera, M.D.  Sleep Medicine [facility]           [1]  Allergies Allergen Reactions   Iodine-131 Anaphylaxis    Pt states when she was 21=she drank an experimental Iodinated Radioisotope for her thyroid . She described it was a small amount in a milk carton in [place] . She states they stopped using it shortly after because the trial wasn't successful. SPM   Prednisone      Make sugar jump way up.   "

## 2024-03-25 ENCOUNTER — Telehealth: Payer: Self-pay

## 2024-03-25 DIAGNOSIS — Z794 Long term (current) use of insulin: Secondary | ICD-10-CM

## 2024-03-25 DIAGNOSIS — E785 Hyperlipidemia, unspecified: Secondary | ICD-10-CM

## 2024-03-25 DIAGNOSIS — G4733 Obstructive sleep apnea (adult) (pediatric): Secondary | ICD-10-CM

## 2024-03-25 NOTE — Telephone Encounter (Signed)
 Patient scheduled for an appointment with you 04/07/24 virtual visit, constipation f/u.  Please advise if you want pt scheduled for a separate appointment  to discuss the items listed below.    Copied from CRM [id number]. Topic: Clinical - Request for Lab/Test Order >> Mar 25, 2024  3:28 PM Roselie BROCKS wrote: Reason for CRM: Patients Pulmonary doctor requests patient get a stress test,calcium  score and a EKG.  Patient requests a return call concerning this.

## 2024-03-25 NOTE — Telephone Encounter (Signed)
 It looks like she is scheduled for 04/07/2024.  We can do the EKG then.  Her last EKG was done in May 2025 and it looked good.   I will order the CT calcium  score scan.  We can discuss the stress test during her upcoming visit and after the results of her CT scan.  She will receive a phone call regarding the CT scan.

## 2024-03-25 NOTE — Addendum Note (Signed)
 Addended by: Valissa Lyvers K on: 03/25/2024 04:55 PM   Modules accepted: Orders

## 2024-03-28 NOTE — Telephone Encounter (Signed)
Spoke with pt relaying Kate's message.  Pt verbalizes understanding.

## 2024-04-04 ENCOUNTER — Encounter: Admitting: Dietician

## 2024-04-06 ENCOUNTER — Other Ambulatory Visit: Payer: Self-pay | Admitting: Primary Care

## 2024-04-06 DIAGNOSIS — J4520 Mild intermittent asthma, uncomplicated: Secondary | ICD-10-CM

## 2024-04-07 ENCOUNTER — Encounter: Payer: Self-pay | Admitting: Primary Care

## 2024-04-07 ENCOUNTER — Telehealth: Payer: Self-pay

## 2024-04-07 ENCOUNTER — Telehealth: Admitting: Primary Care

## 2024-04-07 VITALS — Ht 65.0 in | Wt 246.0 lb

## 2024-04-07 DIAGNOSIS — R413 Other amnesia: Secondary | ICD-10-CM

## 2024-04-07 DIAGNOSIS — K5909 Other constipation: Secondary | ICD-10-CM | POA: Diagnosis not present

## 2024-04-07 DIAGNOSIS — G4733 Obstructive sleep apnea (adult) (pediatric): Secondary | ICD-10-CM

## 2024-04-07 NOTE — Telephone Encounter (Signed)
 Spoke with pt and got her ready for visit.

## 2024-04-07 NOTE — Addendum Note (Signed)
 Addended by: Terin Cragle on: 04/07/2024 12:43 PM   Modules accepted: Level of Service

## 2024-04-07 NOTE — Assessment & Plan Note (Signed)
 Following with pulmonology, reviewed office notes from January 2026.  Reviewed echocardiogram from July 2025 which appears grossly normal. Will obtain coronary calcium  score scan. Will obtain EKG.  Will hold on stress test until those results are back.  She agrees.

## 2024-04-07 NOTE — Telephone Encounter (Signed)
 Pt has video visit today at 9:40 with Molly Wall.   Lvm asking pt to call back. I need to speak with pt to get her ready for visit above.   Also, spoke with pt's daughter, Manuelita (on dpr), notifying her of pt's visit today and asked her to have pt call our office ASAP. Says she will try to contact pt.

## 2024-04-07 NOTE — Assessment & Plan Note (Signed)
 After review of her chart it appears that this is more of a chronic issue.  No alarm signs for acute stroke today.  Will obtain CT head for further evaluation. Will also check B12 lab test during her upcoming visit.

## 2024-04-07 NOTE — Patient Instructions (Addendum)
 Try to take the Linzess  every other day for constipation.   Complete the CT scan of your heart tomorrow at [facility]  You will receive a phone call regarding the CT scan of your head for the [facility] on [address].  Schedule an appointment with me for the EKG. We will also get blood testing.  We will hold off on the stress test until we have those other results.   It was a pleasure to see you today!

## 2024-04-07 NOTE — Progress Notes (Signed)
 "   Patient ID: Molly Wall, female    DOB: [birth year], 79 y.o.   MRN: [id number]  Virtual visit completed through caregility, a video enabled telemedicine application. Due to national recommendations of social distancing due to COVID-19, a virtual visit is felt to be most appropriate for this patient at this time. Reviewed limitations, risks, security and privacy concerns of performing a virtual visit and the availability of in person appointments. I also reviewed that there may be a patient responsible charge related to this service. The patient agreed to proceed.   Patient location: home Provider location: [place] at [facility], office Persons participating in this virtual visit: patient, provider   If any vitals were documented, they were collected by patient at home unless specified below.    Ht 5' 5 (1.651 m)   Wt 246 lb (111.6 kg)   BMI 40.94 kg/m    CC: Chronic Constipation Subjective:   HPI: Molly Wall is a 79 y.o. female with a history of chronic constipation, OSA, type 2 diabetes, asthma, chronic back pain presenting on 04/07/2024 for Medical Management of Chronic Issues (Chronic constipation f/u.)  She would also like to discuss short term memory loss and tests needed for cardiac evaluation.  She was last evaluated on 02/26/2024 for follow-up of severe constipation suspected to be opioid-induced.  During this visit her bowels are moving and symptoms have improved.  She never began taking Linzess  for which was prescribed previously.  She was taking Colace daily. Since her last visit she's doing much better. She has begun taking her Linzess  daily for the last 3 weeks which have caused diarrhea. She stopped for two days and noticed a return in her constipation. She's gaining better control over her diabetes since she got the Omnipod. She's been within target range most of the time.  She underwent a right total knee arthroplasty in late September 2025 per  Dr. Mardee. Since then she's noticed difficulty with short term memory for which she suspects was secondary to the anesthesia. She often forgets what she is saying mid sentence, and has difficulty remembering recent conversations. She has mentioned this to several providers who tell her it was secondary to the anesthesia.   Following with pulmonology for sleep apnea. Recently her pulmonologist recommended cardiac testing to learn the cause for her central sleep apnea. These tests were not ordered. She is needing EKG, coronary calcium  score, and a stress test. She does not see cardiology. She is scheduled for her coronary calcium  CT scan tomorrow. She underwent echocardiogram in 2025 which showed LFEV of 60-65%, normal wall motion, grade 1 diastolic dysfunction, no valve disease. She denies chest pain and shortness of breath.        Relevant past medical, surgical, family and social history reviewed and updated as indicated. Interim medical history since our last visit reviewed. Allergies and medications reviewed and updated. Outpatient Medications Prior to Visit  Medication Sig Dispense Refill   acetaminophen  (TYLENOL ) 650 MG CR tablet Take 1-2 tablets by mouth 3 (three) times daily as needed for pain.     atorvastatin  (LIPITOR) 20 MG tablet TAKE 1 TABLET BY MOUTH EVERY DAY FOR CHOLESTEROL 90 tablet 0   Azelastine  HCl 137 MCG/SPRAY SOLN PLACE 2 SPRAYS INTO BOTH NOSTRILS 2 (TWO) TIMES DAILY. USE IN EACH NOSTRIL AS DIRECTED 30 mL 4   Calcium  Carbonate (CALCIUM  500 PO) Take 1 each by mouth 2 (two) times daily.     celecoxib  (CELEBREX ) 200 MG  capsule Take 1 capsule (200 mg total) by mouth 2 (two) times daily. 60 capsule 1   Cholecalciferol (VITAMIN D -3 PO) Take 1 each by mouth 2 (two) times daily.     Continuous Glucose Sensor (DEXCOM G7 SENSOR) MISC SMARTSIG:1 Each SUB-Q Once a Month     fluticasone  (FLONASE ) 50 MCG/ACT nasal spray INSTILL 1 SPRAY IN EACH NOSTRIL TWICE A DAY AS NEEDED FOR ALLERGIES OR  RHINITIS 48 mL 0   fluticasone -salmeterol (WIXELA INHUB) 250-50 MCG/ACT AEPB TAKE 1 PUFF BY MOUTH TWICE A DAY 60 each 5   Glucagon  3 MG/DOSE POWD Place 3 mg into the nose once as needed for up to 1 dose. 1 each 11   Insulin  Disposable Pump (OMNIPOD 5 DEXG7G6 PODS GEN 5) MISC Inject 1 each into the skin.     insulin  lispro (HUMALOG  KWIKPEN) 100 UNIT/ML KwikPen INJECT 25 UNITS INTO THE SKIN 3 (THREE) TIMES DAILY WITH MEALS 15 mL PRN   ipratropium (ATROVENT ) 0.03 % nasal spray Place 2 sprays into the nose 4 (four) times daily as needed.     levothyroxine  (SYNTHROID ) 88 MCG tablet TAKE 1 AND 1/2 TABLET BY MOUTH EVERY SUNDAY. TAKE 1 TABLET BY MOUTH MON THROUGH SAT. TAKE ON AN EMPTY STOMACH WITH WATER ONLY. NO FOOD OR OTHER MEDICATIONS FOR 30 MINUTES. 96 tablet 1   linaclotide  (LINZESS ) 72 MCG capsule Take 1 capsule (72 mcg total) by mouth daily before breakfast. For constipation 30 capsule 0   metFORMIN  (GLUCOPHAGE -XR) 500 MG 24 hr tablet TAKE 2 TABLETS (1,000 MG TOTAL) BY MOUTH DAILY WITH BREAKFAST. FOR DIABETES. 180 tablet 1   montelukast  (SINGULAIR ) 10 MG tablet Take 10 mg by mouth at bedtime.     NOVOLOG  100 UNIT/ML injection INJECT UP TO 70 UNITS INTO THE SKIN ONCE FOR 1 DOSE. VIA PUMP 70 mL 5   polyethylene glycol powder (GLYCOLAX /MIRALAX ) 17 GM/SCOOP powder Take 17 g by mouth 2 (two) times daily. Dissolve 1 capful (17g) in 4-8 ounces of liquid and take by mouth two times daily. 238 g 0   Probiotic Product (PROBIOTIC PO) Take by mouth.     TURMERIC PO Take by mouth.     alendronate  (FOSAMAX ) 70 MG tablet TAKE 1 TABLET BY MOUTH ONCE A WEEK. TAKE WITH A FULL GLASS OF WATER ON AN EMPTY STOMACH. (Patient not taking: Reported on 03/23/2024) 12 tablet 0   aspirin  EC 81 MG tablet Take 81 mg by mouth 2 (two) times daily. Swallow whole. (Patient not taking: Reported on 03/23/2024)     bisacodyl  (DULCOLAX) 10 MG suppository Unwrap and Place 1 suppository (10 mg total) rectally daily as needed for severe  constipation. (Patient not taking: Reported on 03/23/2024) 12 suppository 0   bisacodyl  (DULCOLAX) 5 MG EC tablet Take 1 tablet (5 mg total) by mouth daily as needed for moderate constipation. 30 tablet 1   celecoxib  (CELEBREX ) 200 MG capsule Take 200 mg by mouth 2 (two) times daily.     Semaglutide , 1 MG/DOSE, (OZEMPIC , 1 MG/DOSE,) 4 MG/3ML SOPN INJECT 1 MG INTO THE SKIN ONE TIME PER WEEK (Patient not taking: Reported on 03/23/2024) 3 mL 11   Vitamin D , Ergocalciferol , (DRISDOL ) 1.25 MG (50000 UNIT) CAPS capsule Take 50,000 Units by mouth every 7 (seven) days. (Patient not taking: Reported on 03/23/2024)     No facility-administered medications prior to visit.     Per HPI unless specifically indicated in ROS section below Review of Systems  Respiratory:  Negative for shortness of breath.  Cardiovascular:  Negative for chest pain.  Gastrointestinal:  Positive for diarrhea.  Neurological:  Negative for dizziness.   Objective:  Ht 5' 5 (1.651 m)   Wt 246 lb (111.6 kg)   BMI 40.94 kg/m   Wt Readings from Last 3 Encounters:  04/07/24 246 lb (111.6 kg)  03/23/24 240 lb 3.2 oz (109 kg)  02/26/24 241 lb 6 oz (109.5 kg)       Physical exam: General: Alert and oriented x 3, no distress, does not appear sickly  Pulmonary: Speaks in complete sentences without increased work of breathing, no cough during visit.  Psychiatric: Normal mood, thought content, and behavior.     Results for orders placed or performed in visit on 03/22/24  Lipid panel   Collection Time: 03/22/24  2:00 PM  Result Value Ref Range   Cholesterol 228 (H) 28 - 200 mg/dL   Triglycerides 08.9 89.9 - 149.0 mg/dL   HDL 13.09 >60.99 mg/dL   VLDL 81.7 0.0 - 59.9 mg/dL   LDL Cholesterol 876 (H) 10 - 99 mg/dL   Total CHOL/HDL Ratio 3    NonHDL 141.09   TSH   Collection Time: 03/22/24  2:00 PM  Result Value Ref Range   TSH 5.99 (H) 0.35 - 5.50 uIU/mL   Assessment & Plan:   Problem List Items Addressed This Visit        Respiratory   OSA (obstructive sleep apnea)   Following with pulmonology, reviewed office notes from January 2026.  Reviewed echocardiogram from July 2025 which appears grossly normal. Will obtain coronary calcium  score scan. Will obtain EKG.  Will hold on stress test until those results are back.  She agrees.         Digestive   Chronic constipation - Primary   Improved but now with diarrhea.  Discussed to start taking Linzess  every other day. Remain hydrated and continue fiber intake.  She will update.        Other   Memory changes   After review of her chart it appears that this is more of a chronic issue.  No alarm signs for acute stroke today.  Will obtain CT head for further evaluation. Will also check B12 lab test during her upcoming visit.      Relevant Orders   CT HEAD WO CONTRAST ( )     No orders of the defined types were placed in this encounter.  Orders Placed This Encounter  Procedures   CT HEAD WO CONTRAST ( )    Standing Status:   Future    Expiration Date:   04/07/2025    Preferred imaging location?:   [facility]    I discussed the assessment and treatment plan with the patient. The patient was provided an opportunity to ask questions and all were answered. The patient agreed with the plan and demonstrated an understanding of the instructions. The patient was advised to call back or seek an in-person evaluation if the symptoms worsen or if the condition fails to improve as anticipated.  Follow up plan:  Try to take the Linzess  every other day for constipation.   Complete the CT scan of your heart tomorrow.   You will receive a phone call regarding the CT scan of your head.  Schedule an appointment for the EKG.   It was a pleasure to see you today!   Sadeel Fiddler K Jackie Littlejohn, NP   "

## 2024-04-07 NOTE — Assessment & Plan Note (Signed)
 Improved but now with diarrhea.  Discussed to start taking Linzess  every other day. Remain hydrated and continue fiber intake.  She will update.

## 2024-04-08 ENCOUNTER — Ambulatory Visit (HOSPITAL_BASED_OUTPATIENT_CLINIC_OR_DEPARTMENT_OTHER)
Admission: RE | Admit: 2024-04-08 | Discharge: 2024-04-08 | Disposition: A | Payer: Self-pay | Source: Ambulatory Visit | Attending: Primary Care

## 2024-04-08 DIAGNOSIS — G4733 Obstructive sleep apnea (adult) (pediatric): Secondary | ICD-10-CM | POA: Insufficient documentation

## 2024-04-08 DIAGNOSIS — E785 Hyperlipidemia, unspecified: Secondary | ICD-10-CM | POA: Insufficient documentation

## 2024-04-08 DIAGNOSIS — Z794 Long term (current) use of insulin: Secondary | ICD-10-CM | POA: Insufficient documentation

## 2024-04-08 DIAGNOSIS — E1165 Type 2 diabetes mellitus with hyperglycemia: Secondary | ICD-10-CM | POA: Insufficient documentation

## 2024-04-14 ENCOUNTER — Ambulatory Visit: Payer: Self-pay | Admitting: Primary Care

## 2024-04-14 ENCOUNTER — Other Ambulatory Visit: Payer: Self-pay | Admitting: Primary Care

## 2024-04-14 DIAGNOSIS — K5909 Other constipation: Secondary | ICD-10-CM

## 2024-04-19 ENCOUNTER — Other Ambulatory Visit

## 2024-04-20 ENCOUNTER — Other Ambulatory Visit

## 2024-04-27 ENCOUNTER — Other Ambulatory Visit

## 2024-04-28 ENCOUNTER — Ambulatory Visit: Payer: Medicare Other | Admitting: Hematology and Oncology

## 2024-04-29 ENCOUNTER — Other Ambulatory Visit

## 2024-05-12 ENCOUNTER — Inpatient Hospital Stay: Admitting: Adult Health

## 2024-05-13 ENCOUNTER — Ambulatory Visit: Admitting: Adult Health

## 2024-05-24 ENCOUNTER — Ambulatory Visit: Admitting: Sleep Medicine

## 2024-05-26 ENCOUNTER — Other Ambulatory Visit

## 2024-06-28 ENCOUNTER — Other Ambulatory Visit

## 2024-08-25 ENCOUNTER — Ambulatory Visit: Admitting: Rheumatology

## 2024-10-31 ENCOUNTER — Ambulatory Visit

## 2024-11-11 ENCOUNTER — Ambulatory Visit: Admitting: Adult Health
